# Patient Record
Sex: Female | Born: 1960
Health system: Southern US, Community
[De-identification: ages and names within clinical notes are randomized; demographics above are authoritative.]

## PROBLEM LIST (undated history)

## (undated) DIAGNOSIS — F329 Major depressive disorder, single episode, unspecified: Secondary | ICD-10-CM

## (undated) DIAGNOSIS — R011 Cardiac murmur, unspecified: Secondary | ICD-10-CM

## (undated) DIAGNOSIS — I10 Essential (primary) hypertension: Secondary | ICD-10-CM

## (undated) DIAGNOSIS — E119 Type 2 diabetes mellitus without complications: Secondary | ICD-10-CM

## (undated) DIAGNOSIS — F419 Anxiety disorder, unspecified: Secondary | ICD-10-CM

## (undated) DIAGNOSIS — T7840XA Allergy, unspecified, initial encounter: Secondary | ICD-10-CM

## (undated) DIAGNOSIS — M1990 Unspecified osteoarthritis, unspecified site: Secondary | ICD-10-CM

## (undated) DIAGNOSIS — F32A Depression, unspecified: Secondary | ICD-10-CM

## (undated) DIAGNOSIS — D649 Anemia, unspecified: Secondary | ICD-10-CM

## (undated) HISTORY — DX: Anemia, unspecified: D64.9

## (undated) HISTORY — PX: TUBAL LIGATION: SHX77

## (undated) HISTORY — DX: Allergy, unspecified, initial encounter: T78.40XA

## (undated) HISTORY — DX: Type 2 diabetes mellitus without complications: E11.9

## (undated) HISTORY — DX: Major depressive disorder, single episode, unspecified: F32.9

## (undated) HISTORY — DX: Depression, unspecified: F32.A

## (undated) HISTORY — PX: BLADDER SUSPENSION: SHX72

## (undated) HISTORY — DX: Essential (primary) hypertension: I10

## (undated) HISTORY — PX: ABDOMINAL HYSTERECTOMY: SHX81

## (undated) HISTORY — DX: Anxiety disorder, unspecified: F41.9

## (undated) HISTORY — PX: APPENDECTOMY: SHX54

---

## 1998-01-15 ENCOUNTER — Encounter: Admission: RE | Admit: 1998-01-15 | Discharge: 1998-04-15 | Payer: Self-pay | Admitting: Neurosurgery

## 1998-03-18 ENCOUNTER — Emergency Department (HOSPITAL_COMMUNITY): Admission: EM | Admit: 1998-03-18 | Discharge: 1998-03-18 | Payer: Self-pay

## 2001-03-01 ENCOUNTER — Encounter: Payer: Self-pay | Admitting: Emergency Medicine

## 2001-03-01 ENCOUNTER — Emergency Department (HOSPITAL_COMMUNITY): Admission: EM | Admit: 2001-03-01 | Discharge: 2001-03-01 | Payer: Self-pay | Admitting: Emergency Medicine

## 2001-07-22 ENCOUNTER — Emergency Department (HOSPITAL_COMMUNITY): Admission: EM | Admit: 2001-07-22 | Discharge: 2001-07-22 | Payer: Self-pay | Admitting: Emergency Medicine

## 2002-07-18 ENCOUNTER — Encounter: Payer: Self-pay | Admitting: Emergency Medicine

## 2002-07-18 ENCOUNTER — Emergency Department (HOSPITAL_COMMUNITY): Admission: EM | Admit: 2002-07-18 | Discharge: 2002-07-18 | Payer: Self-pay | Admitting: Emergency Medicine

## 2003-03-09 ENCOUNTER — Emergency Department (HOSPITAL_COMMUNITY): Admission: EM | Admit: 2003-03-09 | Discharge: 2003-03-09 | Payer: Self-pay | Admitting: Emergency Medicine

## 2003-03-09 ENCOUNTER — Encounter: Payer: Self-pay | Admitting: Emergency Medicine

## 2003-05-11 ENCOUNTER — Emergency Department (HOSPITAL_COMMUNITY): Admission: EM | Admit: 2003-05-11 | Discharge: 2003-05-11 | Payer: Self-pay | Admitting: Emergency Medicine

## 2003-10-24 ENCOUNTER — Emergency Department (HOSPITAL_COMMUNITY): Admission: EM | Admit: 2003-10-24 | Discharge: 2003-10-24 | Payer: Self-pay | Admitting: Emergency Medicine

## 2003-10-25 ENCOUNTER — Encounter (INDEPENDENT_AMBULATORY_CARE_PROVIDER_SITE_OTHER): Payer: Self-pay | Admitting: Specialist

## 2003-10-25 ENCOUNTER — Inpatient Hospital Stay (HOSPITAL_COMMUNITY): Admission: EM | Admit: 2003-10-25 | Discharge: 2003-10-26 | Payer: Self-pay | Admitting: Emergency Medicine

## 2003-11-13 ENCOUNTER — Emergency Department (HOSPITAL_COMMUNITY): Admission: EM | Admit: 2003-11-13 | Discharge: 2003-11-13 | Payer: Self-pay | Admitting: Emergency Medicine

## 2004-01-17 ENCOUNTER — Emergency Department (HOSPITAL_COMMUNITY): Admission: EM | Admit: 2004-01-17 | Discharge: 2004-01-17 | Payer: Self-pay | Admitting: Emergency Medicine

## 2004-02-05 ENCOUNTER — Emergency Department (HOSPITAL_COMMUNITY): Admission: EM | Admit: 2004-02-05 | Discharge: 2004-02-05 | Payer: Self-pay | Admitting: Family Medicine

## 2004-09-11 ENCOUNTER — Emergency Department (HOSPITAL_COMMUNITY): Admission: EM | Admit: 2004-09-11 | Discharge: 2004-09-11 | Payer: Self-pay | Admitting: Emergency Medicine

## 2004-11-15 ENCOUNTER — Ambulatory Visit: Payer: Self-pay | Admitting: Family Medicine

## 2004-12-06 ENCOUNTER — Ambulatory Visit: Payer: Self-pay | Admitting: Family Medicine

## 2004-12-20 ENCOUNTER — Ambulatory Visit: Payer: Self-pay | Admitting: Family Medicine

## 2004-12-20 ENCOUNTER — Other Ambulatory Visit: Admission: RE | Admit: 2004-12-20 | Discharge: 2004-12-20 | Payer: Self-pay | Admitting: Family Medicine

## 2005-01-04 ENCOUNTER — Ambulatory Visit: Payer: Self-pay

## 2005-01-12 ENCOUNTER — Ambulatory Visit: Payer: Self-pay | Admitting: Family Medicine

## 2005-01-19 ENCOUNTER — Emergency Department (HOSPITAL_COMMUNITY): Admission: EM | Admit: 2005-01-19 | Discharge: 2005-01-19 | Payer: Self-pay | Admitting: Emergency Medicine

## 2005-01-26 ENCOUNTER — Ambulatory Visit: Payer: Self-pay | Admitting: Family Medicine

## 2005-05-30 ENCOUNTER — Ambulatory Visit: Payer: Self-pay | Admitting: Family Medicine

## 2005-09-07 ENCOUNTER — Emergency Department (HOSPITAL_COMMUNITY): Admission: EM | Admit: 2005-09-07 | Discharge: 2005-09-08 | Payer: Self-pay | Admitting: Emergency Medicine

## 2005-09-08 ENCOUNTER — Emergency Department (HOSPITAL_COMMUNITY): Admission: EM | Admit: 2005-09-08 | Discharge: 2005-09-08 | Payer: Self-pay | Admitting: Emergency Medicine

## 2006-04-12 ENCOUNTER — Ambulatory Visit: Payer: Self-pay | Admitting: Family Medicine

## 2006-07-13 ENCOUNTER — Ambulatory Visit: Payer: Self-pay | Admitting: Family Medicine

## 2007-01-16 ENCOUNTER — Emergency Department (HOSPITAL_COMMUNITY): Admission: EM | Admit: 2007-01-16 | Discharge: 2007-01-16 | Payer: Self-pay | Admitting: Emergency Medicine

## 2007-03-08 ENCOUNTER — Ambulatory Visit: Payer: Self-pay | Admitting: Internal Medicine

## 2007-03-09 DIAGNOSIS — J45909 Unspecified asthma, uncomplicated: Secondary | ICD-10-CM | POA: Insufficient documentation

## 2007-03-09 DIAGNOSIS — J301 Allergic rhinitis due to pollen: Secondary | ICD-10-CM | POA: Insufficient documentation

## 2007-03-15 ENCOUNTER — Encounter: Admission: RE | Admit: 2007-03-15 | Discharge: 2007-03-15 | Payer: Self-pay | Admitting: Internal Medicine

## 2007-03-19 ENCOUNTER — Ambulatory Visit: Payer: Self-pay | Admitting: Family Medicine

## 2007-03-19 DIAGNOSIS — I10 Essential (primary) hypertension: Secondary | ICD-10-CM | POA: Insufficient documentation

## 2007-04-05 ENCOUNTER — Ambulatory Visit: Payer: Self-pay | Admitting: Family Medicine

## 2007-04-05 DIAGNOSIS — K219 Gastro-esophageal reflux disease without esophagitis: Secondary | ICD-10-CM | POA: Insufficient documentation

## 2007-04-05 DIAGNOSIS — R079 Chest pain, unspecified: Secondary | ICD-10-CM | POA: Insufficient documentation

## 2007-04-05 DIAGNOSIS — J019 Acute sinusitis, unspecified: Secondary | ICD-10-CM | POA: Insufficient documentation

## 2007-04-09 LAB — CONVERTED CEMR LAB
ALT: 13 units/L (ref 0–40)
AST: 16 units/L (ref 0–37)
Albumin: 3.2 g/dL — ABNORMAL LOW (ref 3.5–5.2)
Alkaline Phosphatase: 87 units/L (ref 39–117)
BUN: 3 mg/dL — ABNORMAL LOW (ref 6–23)
Basophils Absolute: 0 10*3/uL (ref 0.0–0.1)
Basophils Relative: 0.9 % (ref 0.0–1.0)
Bilirubin, Direct: 0.1 mg/dL (ref 0.0–0.3)
CO2: 30 meq/L (ref 19–32)
Calcium: 8.4 mg/dL (ref 8.4–10.5)
Chloride: 104 meq/L (ref 96–112)
Cholesterol: 143 mg/dL (ref 0–200)
Creatinine, Ser: 0.7 mg/dL (ref 0.4–1.2)
Eosinophils Absolute: 0.2 10*3/uL (ref 0.0–0.6)
Eosinophils Relative: 5.6 % — ABNORMAL HIGH (ref 0.0–5.0)
GFR calc Af Amer: 117 mL/min
GFR calc non Af Amer: 97 mL/min
Glucose, Bld: 106 mg/dL — ABNORMAL HIGH (ref 70–99)
HCT: 34.4 % — ABNORMAL LOW (ref 36.0–46.0)
HDL: 50.5 mg/dL (ref >39.0)
Hemoglobin: 12 g/dL (ref 12.0–15.0)
LDL Cholesterol: 72 mg/dL (ref 0–99)
Lymphocytes Relative: 37.2 % (ref 12.0–46.0)
MCHC: 34.8 g/dL (ref 30.0–36.0)
MCV: 83 fL (ref 78.0–100.0)
Monocytes Absolute: 0.5 10*3/uL (ref 0.2–0.7)
Monocytes Relative: 12.2 % — ABNORMAL HIGH (ref 3.0–11.0)
Neutro Abs: 1.9 10*3/uL (ref 1.4–7.7)
Neutrophils Relative %: 44.1 % (ref 43.0–77.0)
Platelets: 295 10*3/uL (ref 150–400)
Potassium: 3.3 meq/L — ABNORMAL LOW (ref 3.5–5.1)
RBC: 4.14 M/uL (ref 3.87–5.11)
RDW: 14.8 % — ABNORMAL HIGH (ref 11.5–14.6)
Sodium: 138 meq/L (ref 135–145)
TSH: 2.72 microintl units/mL (ref 0.35–5.50)
Total Bilirubin: 0.4 mg/dL (ref 0.3–1.2)
Total CHOL/HDL Ratio: 2.8
Total Protein: 7 g/dL (ref 6.0–8.3)
Triglycerides: 101 mg/dL (ref 0–149)
VLDL: 20 mg/dL (ref 0–40)
WBC: 4.1 10*3/uL — ABNORMAL LOW (ref 4.5–10.5)

## 2007-05-08 ENCOUNTER — Telehealth (INDEPENDENT_AMBULATORY_CARE_PROVIDER_SITE_OTHER): Payer: Self-pay | Admitting: *Deleted

## 2007-06-21 ENCOUNTER — Encounter: Payer: Self-pay | Admitting: Family Medicine

## 2007-06-21 ENCOUNTER — Other Ambulatory Visit: Admission: RE | Admit: 2007-06-21 | Discharge: 2007-06-21 | Payer: Self-pay | Admitting: Family Medicine

## 2007-06-21 ENCOUNTER — Ambulatory Visit: Payer: Self-pay | Admitting: Family Medicine

## 2007-06-21 DIAGNOSIS — D239 Other benign neoplasm of skin, unspecified: Secondary | ICD-10-CM | POA: Insufficient documentation

## 2007-06-21 DIAGNOSIS — K921 Melena: Secondary | ICD-10-CM | POA: Insufficient documentation

## 2007-06-21 DIAGNOSIS — J309 Allergic rhinitis, unspecified: Secondary | ICD-10-CM | POA: Insufficient documentation

## 2007-06-21 LAB — CONVERTED CEMR LAB: Pap Smear: NORMAL

## 2007-06-25 LAB — CONVERTED CEMR LAB
ALT: 15 units/L (ref 0–35)
AST: 16 units/L (ref 0–37)
Albumin: 3.4 g/dL — ABNORMAL LOW (ref 3.5–5.2)
Alkaline Phosphatase: 77 units/L (ref 39–117)
BUN: 3 mg/dL — ABNORMAL LOW (ref 6–23)
Basophils Absolute: 0 10*3/uL (ref 0.0–0.1)
Basophils Relative: 0 % (ref 0.0–1.0)
Bilirubin, Direct: 0.1 mg/dL (ref 0.0–0.3)
CO2: 32 meq/L (ref 19–32)
Calcium: 8.6 mg/dL (ref 8.4–10.5)
Chloride: 102 meq/L (ref 96–112)
Cholesterol: 163 mg/dL (ref 0–200)
Creatinine, Ser: 0.5 mg/dL (ref 0.4–1.2)
Eosinophils Absolute: 0.1 10*3/uL (ref 0.0–0.6)
Eosinophils Relative: 2.6 % (ref 0.0–5.0)
GFR calc Af Amer: 172 mL/min
GFR calc non Af Amer: 142 mL/min
Glucose, Bld: 105 mg/dL — ABNORMAL HIGH (ref 70–99)
HCT: 35.1 % — ABNORMAL LOW (ref 36.0–46.0)
HDL: 60.3 mg/dL (ref >39.0)
Hemoglobin: 11.9 g/dL — ABNORMAL LOW (ref 12.0–15.0)
LDL Cholesterol: 85 mg/dL (ref 0–99)
Lymphocytes Relative: 42.7 % (ref 12.0–46.0)
MCHC: 33.8 g/dL (ref 30.0–36.0)
MCV: 82.8 fL (ref 78.0–100.0)
Monocytes Absolute: 0.4 10*3/uL (ref 0.2–0.7)
Monocytes Relative: 11.4 % — ABNORMAL HIGH (ref 3.0–11.0)
Neutro Abs: 1.6 10*3/uL (ref 1.4–7.7)
Neutrophils Relative %: 43.3 % (ref 43.0–77.0)
Platelets: 346 10*3/uL (ref 150–400)
Potassium: 2.9 meq/L — ABNORMAL LOW (ref 3.5–5.1)
RBC: 4.25 M/uL (ref 3.87–5.11)
RDW: 14.9 % — ABNORMAL HIGH (ref 11.5–14.6)
Sodium: 140 meq/L (ref 135–145)
TSH: 1.76 microintl units/mL (ref 0.35–5.50)
Total Bilirubin: 0.6 mg/dL (ref 0.3–1.2)
Total CHOL/HDL Ratio: 2.7
Total Protein: 7.3 g/dL (ref 6.0–8.3)
Triglycerides: 87 mg/dL (ref 0–149)
VLDL: 17 mg/dL (ref 0–40)
WBC: 3.7 10*3/uL — ABNORMAL LOW (ref 4.5–10.5)

## 2007-06-28 ENCOUNTER — Encounter (INDEPENDENT_AMBULATORY_CARE_PROVIDER_SITE_OTHER): Payer: Self-pay | Admitting: *Deleted

## 2007-07-03 ENCOUNTER — Encounter: Admission: RE | Admit: 2007-07-03 | Discharge: 2007-07-03 | Payer: Self-pay | Admitting: Family Medicine

## 2007-07-05 ENCOUNTER — Ambulatory Visit: Payer: Self-pay | Admitting: Family Medicine

## 2007-07-30 ENCOUNTER — Observation Stay (HOSPITAL_COMMUNITY): Admission: EM | Admit: 2007-07-30 | Discharge: 2007-07-31 | Payer: Self-pay | Admitting: Emergency Medicine

## 2007-07-30 ENCOUNTER — Encounter (INDEPENDENT_AMBULATORY_CARE_PROVIDER_SITE_OTHER): Payer: Self-pay | Admitting: Surgery

## 2007-08-16 ENCOUNTER — Encounter: Payer: Self-pay | Admitting: Family Medicine

## 2007-08-31 ENCOUNTER — Emergency Department (HOSPITAL_COMMUNITY): Admission: EM | Admit: 2007-08-31 | Discharge: 2007-08-31 | Payer: Self-pay | Admitting: Emergency Medicine

## 2007-09-03 ENCOUNTER — Ambulatory Visit: Payer: Self-pay | Admitting: Family Medicine

## 2007-09-03 DIAGNOSIS — Z862 Personal history of diseases of the blood and blood-forming organs and certain disorders involving the immune mechanism: Secondary | ICD-10-CM

## 2007-09-03 DIAGNOSIS — Z8639 Personal history of other endocrine, nutritional and metabolic disease: Secondary | ICD-10-CM

## 2007-09-03 DIAGNOSIS — E876 Hypokalemia: Secondary | ICD-10-CM | POA: Insufficient documentation

## 2007-09-03 DIAGNOSIS — R109 Unspecified abdominal pain: Secondary | ICD-10-CM | POA: Insufficient documentation

## 2007-09-03 DIAGNOSIS — N390 Urinary tract infection, site not specified: Secondary | ICD-10-CM | POA: Insufficient documentation

## 2007-09-03 DIAGNOSIS — N760 Acute vaginitis: Secondary | ICD-10-CM | POA: Insufficient documentation

## 2007-09-03 LAB — CONVERTED CEMR LAB
Bilirubin Urine: NEGATIVE
Blood in Urine, dipstick: NEGATIVE
Glucose, Urine, Semiquant: NEGATIVE
Ketones, urine, test strip: NEGATIVE
Nitrite: NEGATIVE
Protein, U semiquant: NEGATIVE
Specific Gravity, Urine: <1.005
Urobilinogen, UA: NEGATIVE
pH: 6

## 2007-09-04 ENCOUNTER — Encounter: Payer: Self-pay | Admitting: Family Medicine

## 2007-09-05 ENCOUNTER — Encounter: Payer: Self-pay | Admitting: Family Medicine

## 2007-09-06 LAB — CONVERTED CEMR LAB
Chlamydia, DNA Probe: NEGATIVE
GC Probe Amp, Genital: NEGATIVE

## 2007-09-07 ENCOUNTER — Encounter (INDEPENDENT_AMBULATORY_CARE_PROVIDER_SITE_OTHER): Payer: Self-pay | Admitting: *Deleted

## 2007-09-10 ENCOUNTER — Telehealth (INDEPENDENT_AMBULATORY_CARE_PROVIDER_SITE_OTHER): Payer: Self-pay | Admitting: *Deleted

## 2007-09-10 DIAGNOSIS — D259 Leiomyoma of uterus, unspecified: Secondary | ICD-10-CM | POA: Insufficient documentation

## 2007-09-14 ENCOUNTER — Telehealth (INDEPENDENT_AMBULATORY_CARE_PROVIDER_SITE_OTHER): Payer: Self-pay | Admitting: *Deleted

## 2007-09-18 ENCOUNTER — Encounter (INDEPENDENT_AMBULATORY_CARE_PROVIDER_SITE_OTHER): Payer: Self-pay | Admitting: Diagnostic Radiology

## 2007-09-18 ENCOUNTER — Encounter: Admission: RE | Admit: 2007-09-18 | Discharge: 2007-09-18 | Payer: Self-pay | Admitting: Emergency Medicine

## 2007-10-24 ENCOUNTER — Encounter: Payer: Self-pay | Admitting: Family Medicine

## 2007-10-26 ENCOUNTER — Telehealth (INDEPENDENT_AMBULATORY_CARE_PROVIDER_SITE_OTHER): Payer: Self-pay | Admitting: *Deleted

## 2007-11-08 ENCOUNTER — Telehealth: Payer: Self-pay | Admitting: Family Medicine

## 2007-12-06 ENCOUNTER — Inpatient Hospital Stay (HOSPITAL_COMMUNITY): Admission: RE | Admit: 2007-12-06 | Discharge: 2007-12-09 | Payer: Self-pay | Admitting: Obstetrics and Gynecology

## 2007-12-06 ENCOUNTER — Encounter (INDEPENDENT_AMBULATORY_CARE_PROVIDER_SITE_OTHER): Payer: Self-pay | Admitting: Obstetrics and Gynecology

## 2007-12-16 ENCOUNTER — Inpatient Hospital Stay (HOSPITAL_COMMUNITY): Admission: AD | Admit: 2007-12-16 | Discharge: 2007-12-16 | Payer: Self-pay | Admitting: Obstetrics and Gynecology

## 2007-12-27 ENCOUNTER — Telehealth (INDEPENDENT_AMBULATORY_CARE_PROVIDER_SITE_OTHER): Payer: Self-pay | Admitting: *Deleted

## 2007-12-31 ENCOUNTER — Telehealth (INDEPENDENT_AMBULATORY_CARE_PROVIDER_SITE_OTHER): Payer: Self-pay | Admitting: *Deleted

## 2008-01-16 ENCOUNTER — Encounter: Payer: Self-pay | Admitting: Family Medicine

## 2008-04-25 ENCOUNTER — Telehealth (INDEPENDENT_AMBULATORY_CARE_PROVIDER_SITE_OTHER): Payer: Self-pay | Admitting: *Deleted

## 2008-09-17 ENCOUNTER — Telehealth (INDEPENDENT_AMBULATORY_CARE_PROVIDER_SITE_OTHER): Payer: Self-pay | Admitting: *Deleted

## 2008-11-05 ENCOUNTER — Emergency Department (HOSPITAL_COMMUNITY): Admission: EM | Admit: 2008-11-05 | Discharge: 2008-11-05 | Payer: Self-pay | Admitting: Emergency Medicine

## 2008-11-13 ENCOUNTER — Encounter: Admission: RE | Admit: 2008-11-13 | Discharge: 2008-11-13 | Payer: Self-pay | Admitting: Family Medicine

## 2008-11-13 ENCOUNTER — Ambulatory Visit: Payer: Self-pay | Admitting: Family Medicine

## 2008-11-13 DIAGNOSIS — M545 Low back pain, unspecified: Secondary | ICD-10-CM | POA: Insufficient documentation

## 2008-11-14 ENCOUNTER — Telehealth: Payer: Self-pay | Admitting: Family Medicine

## 2008-12-09 ENCOUNTER — Ambulatory Visit: Payer: Self-pay | Admitting: Family Medicine

## 2009-02-18 ENCOUNTER — Ambulatory Visit: Payer: Self-pay | Admitting: Family Medicine

## 2009-02-18 ENCOUNTER — Telehealth: Payer: Self-pay | Admitting: Family Medicine

## 2009-03-23 ENCOUNTER — Telehealth: Payer: Self-pay | Admitting: Family Medicine

## 2009-03-23 ENCOUNTER — Encounter: Payer: Self-pay | Admitting: Family Medicine

## 2009-03-25 ENCOUNTER — Encounter (INDEPENDENT_AMBULATORY_CARE_PROVIDER_SITE_OTHER): Payer: Self-pay | Admitting: *Deleted

## 2009-04-17 ENCOUNTER — Encounter: Payer: Self-pay | Admitting: Family Medicine

## 2009-08-02 ENCOUNTER — Emergency Department (HOSPITAL_COMMUNITY): Admission: EM | Admit: 2009-08-02 | Discharge: 2009-08-02 | Payer: Self-pay | Admitting: Emergency Medicine

## 2009-08-14 ENCOUNTER — Ambulatory Visit: Payer: Self-pay | Admitting: Internal Medicine

## 2009-08-17 ENCOUNTER — Telehealth: Payer: Self-pay | Admitting: Family Medicine

## 2009-08-19 ENCOUNTER — Ambulatory Visit (HOSPITAL_COMMUNITY): Admission: RE | Admit: 2009-08-19 | Discharge: 2009-08-19 | Payer: Self-pay | Admitting: Internal Medicine

## 2009-08-28 ENCOUNTER — Telehealth (INDEPENDENT_AMBULATORY_CARE_PROVIDER_SITE_OTHER): Payer: Self-pay | Admitting: *Deleted

## 2009-08-28 DIAGNOSIS — N281 Cyst of kidney, acquired: Secondary | ICD-10-CM | POA: Insufficient documentation

## 2009-09-14 ENCOUNTER — Telehealth (INDEPENDENT_AMBULATORY_CARE_PROVIDER_SITE_OTHER): Payer: Self-pay | Admitting: *Deleted

## 2009-09-17 ENCOUNTER — Ambulatory Visit: Payer: Self-pay | Admitting: Family Medicine

## 2009-09-17 ENCOUNTER — Ambulatory Visit: Payer: Self-pay

## 2009-09-17 DIAGNOSIS — M79609 Pain in unspecified limb: Secondary | ICD-10-CM | POA: Insufficient documentation

## 2009-09-17 DIAGNOSIS — IMO0002 Reserved for concepts with insufficient information to code with codable children: Secondary | ICD-10-CM | POA: Insufficient documentation

## 2009-09-18 ENCOUNTER — Telehealth: Payer: Self-pay | Admitting: Family Medicine

## 2009-10-07 ENCOUNTER — Observation Stay (HOSPITAL_COMMUNITY): Admission: EM | Admit: 2009-10-07 | Discharge: 2009-10-08 | Payer: Self-pay | Admitting: Emergency Medicine

## 2009-10-09 ENCOUNTER — Encounter: Payer: Self-pay | Admitting: Family Medicine

## 2009-12-10 ENCOUNTER — Telehealth: Payer: Self-pay | Admitting: Family Medicine

## 2009-12-22 ENCOUNTER — Ambulatory Visit: Payer: Self-pay | Admitting: Family Medicine

## 2009-12-22 DIAGNOSIS — F332 Major depressive disorder, recurrent severe without psychotic features: Secondary | ICD-10-CM | POA: Insufficient documentation

## 2010-01-21 ENCOUNTER — Telehealth (INDEPENDENT_AMBULATORY_CARE_PROVIDER_SITE_OTHER): Payer: Self-pay | Admitting: *Deleted

## 2010-01-21 ENCOUNTER — Encounter (INDEPENDENT_AMBULATORY_CARE_PROVIDER_SITE_OTHER): Payer: Self-pay | Admitting: *Deleted

## 2010-01-25 ENCOUNTER — Telehealth (INDEPENDENT_AMBULATORY_CARE_PROVIDER_SITE_OTHER): Payer: Self-pay | Admitting: *Deleted

## 2010-01-29 ENCOUNTER — Ambulatory Visit: Payer: Self-pay | Admitting: Family Medicine

## 2010-03-28 ENCOUNTER — Emergency Department (HOSPITAL_COMMUNITY): Admission: EM | Admit: 2010-03-28 | Discharge: 2010-03-28 | Payer: Self-pay | Admitting: Emergency Medicine

## 2010-03-30 ENCOUNTER — Ambulatory Visit: Payer: Self-pay | Admitting: Family Medicine

## 2010-04-06 ENCOUNTER — Emergency Department (HOSPITAL_COMMUNITY): Admission: EM | Admit: 2010-04-06 | Discharge: 2010-04-06 | Payer: Self-pay | Admitting: Emergency Medicine

## 2010-05-28 ENCOUNTER — Telehealth: Payer: Self-pay | Admitting: Family Medicine

## 2010-06-08 ENCOUNTER — Telehealth: Payer: Self-pay | Admitting: Family Medicine

## 2010-11-07 ENCOUNTER — Encounter: Payer: Self-pay | Admitting: Family Medicine

## 2010-11-08 ENCOUNTER — Encounter: Payer: Self-pay | Admitting: Family Medicine

## 2010-11-16 NOTE — Letter (Signed)
[facility]   Imported By: Freddy Jaksch 09/11/2007 16:43:08  _____________________________________________________________________  External Attachment:    Type:   Image     Comment:   External Document

## 2010-11-16 NOTE — Assessment & Plan Note (Signed)
Summary: allergic reaction   Vital Signs:  Patient profile:   50 year old female Height:      64.25 inches Weight:      238 pounds BMI:     40.68 Temp:     98.3 degrees F oral Pulse rate:   74 / minute Resp:     18 per minute BP sitting:   130 / 98  (left arm)  Vitals Entered By: Ardyth Man (Feb 18, 2009 11:48 AM) CC: severe pain in her spine from arthritis Is Patient Diabetic? No  Have you ever been in a relationship where you felt threatened, hurt or afraid?No   Does patient need assistance? Functional Status Self care Ambulation Normal   History of Present Illness: Pt here secondary to back pain.  Disability has not come through yet but Insurance come through in June.  Pain is getting worse per pt.  Pt states Toradol helped last visit but she had reaction to flexeril and ultram.    Current Medications (verified): 1)  Maxzide-25 37.5-25 Mg Tabs (Triamterene-Hctz) .Marland Kitchen.. 1 By Mouth Once Daily 2)  Advair Diskus 250-50 Mcg/dose  Misc (Fluticasone-Salmeterol) .Marland Kitchen.. 1 Inh Two Times A Day 3)  Proair Hfa 108 (90 Base) Mcg/act  Aers (Albuterol Sulfate) .... 2  Puffs Qid As Needed 4)  Toprol Xl 50 Mg  Tb24 (Metoprolol Succinate) .Marland Kitchen.. 1 By Mouth Once Daily 5)  Orphenadrine Citrate Cr 100 Mg Xr12h-Tab (Orphenadrine Citrate) .... Take 1 Tablet By Mouth Two Times A Day 6)  Dilaudid 8 Mg Tabs (Hydromorphone Hcl) .Marland Kitchen.. 1 By Mouth Every 4 Hours As Needed  Allergies (verified): 1)  ! Codeine  Past History:  Past medical, surgical, family and social histories (including risk factors) reviewed, and no changes noted (except as noted below).  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 Lungs are clear to auscultation, no crackles or wheezes. Heart:  Normal rate and regular rhythm. S1 and S2 normal without gallop, murmur, click, rub or other extra sounds. Msk:  no joint swelling, no joint warmth, no redness over joints, no joint deformities, no joint instability, and no crepitation.   Extremities:  No clubbing, cyanosis, edema, or deformity noted with normal full range of motion of all joints.   Neurologic:  Pt c/o pain in R leg to knee but weakness is in L leg with ext  otherwise good strength Pt walking with crutches Skin:  Intact without suspicious lesions or rashes Psych:  Oriented X3 and normally interactive.     Impression & Recommendations:  Problem # 1:  LOW BACK PAIN, ACUTE (ICD-724.2)  The following medications were removed from the medication list:    Ultram 50 Mg Tabs (Tramadol hcl) .Marland Kitchen... 1 by mouth every 6 hours as needed    Flexeril 10 Mg Tabs (Cyclobenzaprine hcl) .Marland Kitchen... 1 by mouth three times a day as needed Her updated medication list for this problem includes:    Orphenadrine Citrate Cr 100 Mg Xr12h-tab (Orphenadrine citrate) .Marland Kitchen... Take 1 tablet by mouth two times a day    Dilaudid 8 Mg Tabs (Hydromorphone hcl) .Marland Kitchen... 1 by mouth every 4 hours as needed  Discussed use of moist heat or ice, modified activities, medications, and  stretching/strengthening exercises. Back care instructions given. To be seen in 2 weeks if no improvement; sooner if worsening of symptoms.   Orders: Ketorolac-Toradol 15mg  (A2130) Admin of Therapeutic Inj  intramuscular or subcutaneous (86578) Neurosurgeon Referral (Neurosurgeon)  Complete Medication List: 1)  Maxzide-25 37.5-25 Mg Tabs (Triamterene-hctz) .Marland Kitchen.. 1 by mouth once daily 2)  Advair Diskus 250-50 Mcg/dose Misc (Fluticasone-salmeterol) .Marland Kitchen.. 1 inh two times a day 3)  Proair Hfa 108 (90 Base) Mcg/act Aers (Albuterol sulfate) .... 2  puffs qid as needed 4)  Toprol Xl 50 Mg Tb24 (Metoprolol succinate) .Marland Kitchen.. 1 by mouth once daily 5)  Orphenadrine Citrate Cr 100 Mg Xr12h-tab (Orphenadrine citrate) .... Take 1 tablet by mouth two times a day 6)  Dilaudid 8 Mg Tabs (Hydromorphone hcl) .Marland Kitchen.. 1 by mouth every 4 hours as needed Prescriptions: DILAUDID 8 MG TABS (HYDROMORPHONE HCL) 1 by mouth every 4 hours as needed  #60 x 0   Entered and Authorized by:   Loreen Freud DO   Signed by:   Loreen Freud DO on 02/18/2009   Method used:   Print then Give to Patient   RxID:   [id number]    Medication Administration  Injection # 1:    Medication: Ketorolac-Toradol 15mg     Diagnosis: LOW BACK PAIN, ACUTE (ICD-724.2)    Route: IM    Site: RUOQ gluteus    Exp Date: 05/16/2010    Lot #: [id number]    Mfr: novaplus    Given by: Ardyth Man (Feb 18, 2009 12:09 PM)  Orders Added: 1)  Est. Patient Level III [66440] 2)  Ketorolac-Toradol 15mg  [J1885] 3)  Admin of Therapeutic Inj  intramuscular or subcutaneous [96372] 4)  Neurosurgeon Referral [Neurosurgeon]

## 2010-11-16 NOTE — Progress Notes (Signed)
Summary: Refill Request  Phone Note Refill Request Call back at [phone] Message from:  Pharmacy on June 08, 2010 12:37 PM  Refills Requested: Medication #1:  FLEXERIL 10 MG TABS 1 by mouth three times a day as needed.   Dosage confirmed as above?Dosage Confirmed   Supply Requested: 1 month   Last Refilled: 05/28/2010 [facility]  Next Appointment Scheduled: none Initial call taken by: Lavell Islam,  June 08, 2010 12:37 PM  Follow-up for Phone Call        last ov- 03/30/10. Army Fossa CMA  June 08, 2010 4:43 PM   Additional Follow-up for Phone Call Additional follow up Details #1::        refill x1 Additional Follow-up by: Loreen Freud DO,  June 08, 2010 5:04 PM    Additional Follow-up for Phone Call Additional follow up Details #2::    Phoned in. Lucious Groves CMA  June 09, 2010 11:35 AM   Prescriptions: FLEXERIL 10 MG TABS (CYCLOBENZAPRINE HCL) 1 by mouth three times a day as needed  #30 x 0   Entered by:   Lucious Groves CMA   Authorized by:   Loreen Freud DO   Signed by:   Lucious Groves CMA on 06/09/2010   Method used:   Telephoned to ...       [facility] ([facility]ail)       [address]       [place], [place]  [address]       Ph: [phone]       Fax: [phone]   RxID:   [id number]

## 2010-11-16 NOTE — Consult Note (Signed)
Summary: [facility]  [facility]   Imported By: Lanelle Bal 04/28/2009 11:46:33  _____________________________________________________________________  External Attachment:    Type:   Image     Comment:   External Document

## 2010-11-16 NOTE — Progress Notes (Signed)
Summary: pain meds  Phone Note Call from Patient Call back at Home Phone [phone]   Caller: Patient Summary of Call: Pt called and stated she was in a lot of pain and would like some type of pain pills is this possible?  Initial call taken by: Army Fossa CMA,  September 18, 2009 3:53 PM  Follow-up for Phone Call        ultram 50 mg  #60  1-2 by mouth every 6 hours as needed -- call monday if no better Follow-up by: Loreen Freud DO,  September 18, 2009 4:54 PM  Additional Follow-up for Phone Call Additional follow up Details #1::        pt aware.  Additional Follow-up by: Army Fossa CMA,  September 18, 2009 5:02 PM    New/Updated Medications: ULTRAM 50 MG TABS (TRAMADOL HCL) 1-2 every 6 hrs as needed. Prescriptions: ULTRAM 50 MG TABS (TRAMADOL HCL) 1-2 every 6 hrs as needed.  #60 x 0   Entered by:   Army Fossa CMA   Authorized by:   Loreen Freud DO   Signed by:   Army Fossa CMA on 09/18/2009   Method used:   Electronically to        [facility] [phone]* (retail)       [address]       [place], [place]  [address]       Ph: [phone]       Fax: [phone]   RxID:   [id number]

## 2010-11-16 NOTE — Assessment & Plan Note (Signed)
Summary: DISABILITY CON PER DR.LOWNE   Vital Signs:  Patient Profile:   50 Years Old Female Height:     64.25 inches Weight:      238 pounds Temp:     97.9 degrees F oral Pulse rate:   78 / minute Resp:     18 per minute BP sitting:   130 / 84  (right arm) Cuff size:   large  Pt. in pain?   no  Vitals Entered By: ENR                  PCP:  Laury Axon  Chief Complaint:  Disability Consult.  History of Present Illness: Pt here to discuss disability.  Pt in alot of pain still but can not afford to go to specialist because she has no insurance.   Pt has not been to social services yet.  Pt has not picked up pain meds yet.    Pt not able to walk without crutch.   Pt noticeably in pain and  uncomfortable.  Pt is not able to dress self.  Her daughter and husband help.      Current Allergies: ! 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       Never Smoked       Alcohol use-no       Drug use-no       Married       Regular exercise-no   Risk Factors: Tobacco use:  never Passive smoke exposure:  no Drug use:  no HIV high-risk behavior:  no Caffeine use:  1 drinks per day Alcohol use:  no Exercise:  no Seatbelt use:  100 %  Family History Risk Factors:    Family History of MI in females < 70 years old:  no    Family History of MI in males < 61 years old:   no  Mammogram History:    Date of Last Mammogram:  07/06/2007  PAP Smear History:    Date of Last PAP Smear:  06/21/2007   Review of Systems      See HPI   Physical Exam  General:     alert and uncomfortable-appearing.   Msk:     Pt unable to bend forward without pain. + pain with palpation low back.  Extremities:     No clubbing, cyanosis, edema, or deformity noted with normal full range of motion of all joints.   Neurologic:     alert & oriented X3 and cranial nerves II-XII intact.   Pt walks with crutch.   Psych:     Oriented X3, good eye contact, and subdued.  Impression & Recommendations:  Problem # 1:  LOW BACK PAIN, ACUTE (ICD-724.2)  The following medications were removed from the medication list:    Hydromorphone Hcl 4 Mg Tabs (Hydromorphone hcl) .Marland Kitchen... Take 1 tab by mouth every 4 hours as needed    Naprosyn 500 Mg Tabs (Naproxen) .Marland Kitchen... Take 1 by mouth two times a day  Her updated medication list for this problem includes:    Ultram 50 Mg Tabs (Tramadol hcl) .Marland Kitchen... 1 by mouth every 6 hours as needed    Orphenadrine Citrate Cr 100 Mg Xr12h-tab (Orphenadrine citrate) .Marland Kitchen... Take 1 tablet by mouth two times a day    Dilaudid 8 Mg Tabs (Hydromorphone hcl) .Marland Kitchen... 1 by mouth every 4 hours as needed    Flexeril 10 Mg Tabs (Cyclobenzaprine hcl) .Marland Kitchen... 1 by mouth three times a day as needed Discussed use of moist heat or ice, modified activities, medications, and stretching/strengthening exercises. Back care instructions given. To be seen in 2 weeks if no improvement; sooner if worsening of symptoms.  Orders: Ketorolac-Toradol 15mg  (Z6109) Admin of Therapeutic Inj  intramuscular or subcutaneous (60454)   Complete Medication List: 1)  Maxzide-25 37.5-25 Mg Tabs (Triamterene-hctz) .Marland Kitchen.. 1 by mouth once daily 2)  Advair Diskus 250-50 Mcg/dose Misc (Fluticasone-salmeterol) .Marland Kitchen.. 1 inh two times a day 3)  Proair Hfa 108 (90 Base) Mcg/act Aers (Albuterol sulfate) .... 2   puffs qid as needed 4)  Toprol Xl 50 Mg Tb24 (Metoprolol succinate) .Marland Kitchen.. 1 by mouth once daily 5)  Ultram 50 Mg Tabs (Tramadol hcl) .Marland Kitchen.. 1 by mouth every 6 hours as needed 6)  Orphenadrine Citrate Cr 100 Mg Xr12h-tab (Orphenadrine citrate) .... Take 1 tablet by mouth two times a day 7)  Dilaudid 8 Mg Tabs (Hydromorphone hcl) .Marland Kitchen.. 1 by mouth every 4 hours as needed 8)  Flexeril 10 Mg Tabs (Cyclobenzaprine hcl) .Marland Kitchen.. 1 by mouth three times a day as needed      Medication Administration  Injection # 1:    Medication: Ketorolac-Toradol 15mg     Diagnosis: LOW BACK PAIN, ACUTE (ICD-724.2)    Route: IM    Site: RUOQ gluteus    Exp Date: 08/15/2010    Lot #: [id number]    Mfr: app    Patient tolerated injection without complications    Given by: Ardyth Man (December 09, 2008 11:28 AM)  Orders Added: 1)  Ketorolac-Toradol 15mg  [J1885] 2)  Admin of Therapeutic Inj  intramuscular or subcutaneous [14782]

## 2010-11-16 NOTE — Assessment & Plan Note (Signed)
Summary: hospital follow up/drb   Vital Signs:  Patient profile:   50 year old female Height:      64 inches Weight:      228 pounds BMI:     39.28 Temp:     98.4 degrees F oral Pulse rate:   90 / minute Pulse rhythm:   regular BP sitting:   136 / 80  (left arm) Cuff size:   regular  Vitals Entered By: Army Fossa CMA (March 30, 2010 2:04 PM) CC: Pt here for hospital follow up, still have extreme back pain, Back Pain   History of Present Illness:       This is a 50 year old woman who presents with Back Pain.  The symptoms began 4 days ago.  No known injury.   exacerbation of previous arthritis pain.  Pt is working on getting medicaid so she should be able to see specialist soon.  The patient denies fever, chills, weakness, loss of sensation, fecal incontinence, urinary incontinence, urinary retention, dysuria, rest pain, inability to work, and inability to care for self.  The pain is located in the right low back.  The pain began at home and suddenly.  The pain radiates to the right leg below the knee.  The pain is made worse by standing or walking, flexion, extension, and activity.  The pain is made better by inactivity.  Pt has been referred to specialist but has been unable to go secondary to no insurance.  Her medicaid should come through soon.  Allergies: 1)  ! Codeine  Past History:  Past medical, surgical, family and social histories (including risk factors) reviewed for relevance to current acute and chronic problems.  Past Medical History: Reviewed history from 06/21/2007 and no changes required. Hypertension Asthma Allergic rhinitis  Past Surgical History: Reviewed history from 06/21/2007 and no changes required. Appendectomy Caesarean section X3  Family History: Reviewed history from 06/21/2007 and no changes required. Family History of Cervical cancer Family History Depression Family History Hypertension Sister--- hole in heart  Social History: Reviewed  history from 08/14/2009 and no changes required. 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   see above Psych:  Oriented X3 and normally interactive.     Impression & Recommendations:  Problem # 1:  BACK PAIN, LUMBAR, WITH RADICULOPATHY (ICD-724.4) Pt needs referral but we need to wait until ins comes through. The following medications were removed from the medication list:    Ultram 50 Mg Tabs (Tramadol hcl) Her updated medication list for this problem includes:    Norco 5-325 Mg Tabs (Hydrocodone-acetaminophen) .Marland Kitchen... Take 1 by mouth every 4-6 hrs as needed.    Ibuprofen 600 Mg Tabs (Ibuprofen) .Marland Kitchen... Take 1 three times a day with meals    Flexeril 10 Mg Tabs (Cyclobenzaprine hcl) .Marland Kitchen... 1 by mouth three times a day as needed  Discussed use of moist heat or ice, modified activities, medications, and stretching/strengthening exercises. Back care instructions given. To be seen in 2 weeks if no improvement; sooner if worsening of symptoms.   Complete Medication List: 1)  Maxzide-25 37.5-25 Mg Tabs (Triamterene-hctz) .Marland Kitchen.. 1 by mouth once daily 2)  Advair Diskus 250-50 Mcg/dose Misc (Fluticasone-salmeterol) .Marland Kitchen.. 1 inh two times  a day 3)  Lexapro 10 Mg Tabs (Escitalopram oxalate) .Marland Kitchen.. 1 by mouth once daily 4)  Norco 5-325 Mg Tabs (Hydrocodone-acetaminophen) .... Take 1 by mouth every 4-6 hrs as needed. 5)  Ibuprofen 600 Mg Tabs (Ibuprofen) .... Take 1 three times a day with meals 6)  Flexeril 10 Mg Tabs (Cyclobenzaprine hcl) .Marland Kitchen.. 1 by mouth three times a day as needed Prescriptions: NORCO 5-325 MG TABS (HYDROCODONE-ACETAMINOPHEN) take 1 by mouth every 4-6 hrs as  needed.  #30 x 0   Entered and Authorized by:   Loreen Freud DO   Signed by:   Loreen Freud DO on 03/30/2010   Method used:   Print then Give to Patient   RxID:   [id number] FLEXERIL 10 MG TABS (CYCLOBENZAPRINE HCL) 1 by mouth three times a day as needed  #30 x 0   Entered and Authorized by:   Loreen Freud DO   Signed by:   Loreen Freud DO on 03/30/2010   Method used:   Electronically to        [facility] (retail)       [address]       [place], [place]  [address]       Ph: [phone]       Fax: [phone]   RxID:   [id number]

## 2010-11-16 NOTE — Progress Notes (Signed)
Summary: back pain/Lowne  Phone Note Call from Patient Call back at Work Phone [phone]   Caller: Patient Summary of Call: pt called was seen 12/09/08 c/o back pain pt says have no insurance, was given flexeril dilaudid, ultram. pt says she got allergic reaction and just scratched (pt did not call) I asked what did she do, she said I just dealt with the pain, but last few days pain worse, pt asking for a different med if possible. Dr Laury Axon I offered pt appt she says she has no insurance and no money Initial call taken by: Kandice Hams,  Feb 18, 2009 9:33 AM  Follow-up for Phone Call        That was 3 months ago --- pt needs to be reevaluated--- or see specialist Follow-up by: Loreen Freud DO,  Feb 18, 2009 10:02 AM  Additional Follow-up for Phone Call Additional follow up Details #1::        Patient aware Ardyth Man  Feb 18, 2009 10:38 AM Patient coming in at 11:30 today. Ardyth Man  Feb 18, 2009 10:41 AM  Additional Follow-up by: Ardyth Man,  Feb 18, 2009 10:38 AM    o

## 2010-11-16 NOTE — Progress Notes (Signed)
Summary: DISCUSS X-RAY  Phone Note Outgoing Call   Call placed by: Doristine Devoid,  September 10, 2007 2:54 PM Call placed to: Patient Details for Reason: X-RAY Summary of Call: PT AWARE REPORT SHOWED FIBROIDS SO PT IS GOING TO BE REFERRED TO GYN FOR FOLLOW-UP.

## 2010-11-16 NOTE — Progress Notes (Signed)
[facility]: [facility]  ---- Converted from flag ---- ---- 09/14/2007 8:33 AM, Loreen Freud DO wrote: [facility] has been tryingto reach pt get more views done.  Please try to reach pt and find out how they can reach her.  also---  pt has appt with gyn being set up---  copy of mammo and letter can be sent to gyn ------------------------------  CALLED PT TO INFORMED THAT [facility] HAD BEEN TRYING TO REACH HER TO SCHEDULE FOLLOW UP [facility]ED [facility] PT APPT SCHEDULE FOR TUES. DEC. 2 @ 2:40PM AND PT IS AWARE

## 2010-11-16 NOTE — Progress Notes (Signed)
Summary: lowne--question  Phone Note Call from Patient Call back at [facility] Phone [phone]   Summary of Call: pt is calling with question from another dr. office that dr. Laury Axon sent her too and only want to speak to her nurse. Initial call taken by: Freddy Jaksch,  October 26, 2007 3:21 PM  Follow-up for Phone Call        pt called she seen gyn this week in regards to fibroids was giving option to have surgery or take bcp and wanted to know what would be the be the best option so I advised pt schedule office visit with dr Laury Axon she agreed so appt scheduled for 2.2.09 Follow-up by: Doristine Devoid,  October 26, 2007 4:54 PM

## 2010-11-16 NOTE — Progress Notes (Signed)
Summary: [facility]   Imported By: Charolette Child 11/14/2007 15:50:29  _____________________________________________________________________  External Attachment:    Type:   Image     Comment:   External Document

## 2010-11-16 NOTE — Letter (Signed)
Summary: Fax Concerning Referral/[facility]  Fax Concerning Referral/[facility]   Imported By: Lanelle Bal 03/25/2009 10:14:52  _____________________________________________________________________  External Attachment:    Type:   Image     Comment:   External Document

## 2010-11-16 NOTE — Progress Notes (Signed)
Summary: REFILL REQUEST(MAXIDE)  Phone Note Refill Request   Refills Requested: Medication #1:  MAXZIDE-25 37.5-25 MG TABS   Last Refilled: 09/20/2007   Notes: 1 PO QD QTY-30, [facility] PHONE NUMBER-[phone], FAX NUMBER-[phone]     New/Updated Medications: MAXZIDE-25 37.5-25 MG TABS (TRIAMTERENE-HCTZ) 1 by mouth once daily   Prescriptions: MAXZIDE-25 37.5-25 MG TABS (TRIAMTERENE-HCTZ) 1 by mouth once daily  #30 x 3   Entered by:   Shonna Chock   Authorized by:   Loreen Freud DO   Signed by:   Shonna Chock on 12/27/2007   Method used:   Electronically sent to ...       [facility] [address]       [address]       [address]       [place], [place]  [address]       Ph: [phone] or [phone]       Fax: [phone]   RxID:   [id number]

## 2010-11-16 NOTE — Assessment & Plan Note (Signed)
Summary: 10 DAY FOLLOWUP PER DR PAZ///SPH  Medications Added MAXZIDE-25 37.5-25 MG TABS (TRIAMTERENE-HCTZ)         Vital Signs:  Patient Profile:   50 Years Old Female Weight:      228.6 pounds Pulse rate:   84 / minute BP sitting:   148 / 92  (left arm)  Vitals Entered By: Stephan Minister (March 19, 2007 4:16 PM)               PCP:  Laury Axon  Chief Complaint:  10 DAY FOLLOW-UP PER DR.PAZ.  History of Present Illness: Pt is here to f/u with Dr. Drue Novel.  She had 12 days of bleeding.   Pt better after Motrin.      Past Medical History:    Hypertension     Review of Systems  General      Denies chills, fatigue, fever, loss of appetite, malaise, sleep disorder, sweats, weakness, and weight loss.  CV      Denies chest pain or discomfort, difficulty breathing at night, difficulty breathing while lying down, palpitations, and shortness of breath with exertion.  Resp      Denies chest discomfort, chest pain with inspiration, cough, and shortness of breath.  GU      Complains of abnormal vaginal bleeding.      Had abnormal bleeding last visit.  now resolved.     Physical Exam  General:     Well-developed,well-nourished,in no acute distress; alert,appropriate and cooperative throughout examination Neck:     No deformities, masses, or tenderness noted. Lungs:     Normal respiratory effort, chest expands symmetrically. Lungs are clear to auscultation, no crackles or wheezes. Heart:     normal rate, regular rhythm, and Grade 1-2   /6 systolic ejection murmur.   Extremities:     1+ left pedal edema and 1+ right pedal edema.      Impression & Recommendations:  Problem # 1:  HYPERTENSION (ICD-401.9) Assessment: Deteriorated Pt been out of medication over weekend. D/W pt importance of not running out of meds.   Her updated medication list for this problem includes:    Maxzide-25 37.5-25 Mg Tabs (Triamterene-hctz) we will refill meds.  Pt will schedule labs and  cpe re check bp 2-3 weeks Orders: Cardiology Referral (Cardiology)   Medications Added to Medication List This Visit: 1)  Maxzide-25 37.5-25 Mg Tabs (Triamterene-hctz)   Patient Instructions: 1)  Please schedule a follow-up appointment in 2 weeks. 2)  It is important that you exercise regularly at least 20 minutes 5 times a week. If you develop chest pain, have severe difficulty breathing, or feel very tired , stop exercising immediately and seek medical attention. 3)  Check your Blood Pressure regularly. If it is above 130/89 : you should make an appointment. Prescriptions: MAXZIDE-25 37.5-25 MG TABS (TRIAMTERENE-HCTZ)   #30 x 3   Entered and Authorized by:   Loreen Freud DO   Signed by:   Loreen Freud DO on 03/19/2007   Method used:   Print then Give to Patient   RxID:   [id number]   Appended Document: 10 DAY FOLLOWUP PER DR PAZ///SPH Pt was here  to  follow up from Vaginal bleeding as well.  She is doing well using motrin for 3-4 days only.  US pelvis was reviewed with pt.  +fibroids.  We will refer to Gyn if symptoms persists.

## 2010-11-16 NOTE — Progress Notes (Signed)
Summary: letter for jury duty  Phone Note Call from Patient   Caller: Patient Summary of Call: patient left msg on voicemail would like to have letter written to be excused from jury duty.Marland KitchenMarland KitchenMarland KitchenDoristine Devoid  January 21, 2010 8:11 AM   Follow-up for Phone Call        ok to write note excusing her from jury duty secondary to undergoing treatment for severe depression Follow-up by: Loreen Freud DO,  January 21, 2010 12:49 PM  Additional Follow-up for Phone Call Additional follow up Details #1::        Letter written and mailed to court systems. Additional Follow-up by: Harold Barban,  January 21, 2010 1:22 PM

## 2010-11-16 NOTE — Letter (Signed)
Summary: Primary Care Consult Scheduled Letter  [facility] [place]  [address] [place], [place] [address]   Phone: [phone]  Fax: [phone]      03/25/2009 MRN: [id number]  Heather Walker [address] [place], [place]  [address]    Dear [name],      We have scheduled an appointment for you.  At the recommendation of Dr. Loreen Freud, we have scheduled you a consult with [facility] (Neurology) on 04-17-09 arrive by 11:00am.  Their address is [address], [address], [place] [address]. The office phone number is [phone].  If this appointment day and time is not convenient for you, please feel free to call the office of the doctor you are being referred to at the number listed above and reschedule the appointment.     It is important for you to keep your scheduled appointments. We are here to make sure you are given good patient care. If you have questions or you have made changes to your appointment, please notify us at  [phone], ask for Renee.    Thank you,  Patient Care Coordinator [facility]

## 2010-11-16 NOTE — Assessment & Plan Note (Signed)
Summary: CPX & LAB/CBS   Vital Signs:  Patient Profile:   50 Years Old Female LMP:     06/04/2007 Height:     64.25 inches Weight:      224 pounds Temp:     99.2 degrees F oral Pulse rate:   72 / minute Resp:     20 per minute BP sitting:   150 / 102  (left arm) Cuff size:   large  Vitals Entered By: Shonna Chock (June 21, 2007 9:04 AM)  Menstrual History: LMP (date): 06/04/2007               Last PAP Date 12/20/2004   PCP:  Laury Axon  Chief Complaint:  CPX WITH FASTING LABS AND PAP.  History of Present Illness: Pt here for CPE and pap.  Pt with no complaints except she has not taking Bp meds in 1 week because she could not afford copay.    Hypertension Follow-Up      This is a 50 year old woman who presents for Hypertension follow-up.  The patient denies lightheadedness, urinary frequency, headaches, edema, impotence, rash, and fatigue.  The patient denies the following associated symptoms: chest pain, chest pressure, exercise intolerance, dyspnea, palpitations, syncope, leg edema, and pedal edema.  Compliance with medications (by patient report) has been sporadic.  The patient reports that dietary compliance has been poor.  The patient reports no exercise.  Adjunctive measures currently used by the patient include salt restriction.    Current Allergies: ! 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  Eyes      Denies blurring, discharge, double vision, eye irritation, eye pain, halos, itching, light sensitivity, red eye, vision loss-1 eye, and vision loss-both eyes.      optho- q 1year  ENT      Denies decreased hearing, difficulty swallowing, ear discharge, earache, hoarseness, nasal congestion, nosebleeds, postnasal drainage, ringing in ears, sinus pressure, and sore throat.      dentist--  due  CV      Denies bluish discoloration of lips or nails, chest pain or discomfort, difficulty breathing at night, difficulty breathing while lying down, fainting, fatigue, leg cramps with exertion, lightheadness, near fainting, palpitations, shortness of breath with exertion, swelling  of feet, swelling of hands, and weight gain.  Resp      Denies chest discomfort, chest pain with inspiration, cough, coughing up blood, excessive snoring, hypersomnolence, morning headaches, pleuritic, shortness of breath, sputum productive, and wheezing.  GI      Denies abdominal pain, bloody stools, change in bowel habits, constipation, dark tarry stools, diarrhea, excessive appetite, gas, hemorrhoids, indigestion, loss of appetite, nausea, vomiting, vomiting blood, and yellowish skin color.  GU      Denies abnormal vaginal bleeding, decreased libido, discharge, dysuria, genital sores, hematuria, incontinence, nocturia, urinary frequency, and urinary hesitancy.  MS      Denies joint pain, joint redness, joint swelling, loss of strength, low back pain, mid back pain, muscle aches, muscle , cramps, muscle weakness, stiffness, and  thoracic pain.  Derm      Denies changes in color of skin, changes in nail beds, dryness, excessive perspiration, flushing, hair loss, insect bite(s), itching, lesion(s), poor wound healing, and rash.  Neuro      Denies brief paralysis, difficulty with concentration, disturbances in coordination, falling down, headaches, inability to speak, memory loss, numbness, poor balance, seizures, sensation of room spinning, tingling, tremors, visual disturbances, and weakness.  Psych      Denies alternate hallucination ( auditory/visual), anxiety, depression, easily angered, easily tearful, irritability, mental problems, panic attacks, sense of great danger, suicidal thoughts/plans, thoughts of violence, unusual visions or sounds, and thoughts /plans of harming others.  Endo      Denies cold intolerance, excessive hunger, excessive thirst, excessive urination, heat intolerance, polyuria, and weight change.  Heme      Denies abnormal bruising, bleeding, enlarge lymph nodes, fevers, pallor, and skin discoloration.  Allergy      Denies hives or rash, itching eyes, persistent infections, seasonal allergies, and sneezing.     Impression & Recommendations:  Problem # 1:  PREVENTIVE HEALTH CARE (ICD-V70.0) SBE, CA check mammo GHM utd Orders: Venipuncture ([address]) TLB-Lipid Panel (80061-LIPID) TLB-BMP (Basic Metabolic Panel-BMET) (80048-METABOL) TLB-CBC Platelet - w/Differential (85025-CBCD) TLB-Hepatic/Liver Function Pnl (80076-HEPATIC) TLB-TSH (Thyroid Stimulating Hormone) (98119-JYN) Radiology Referral (Radiology)   Problem # 2:  MOLE (ICD-216.9) Assessment: Unchanged  Orders: Misc. Referral (Misc.  plastic surgeon   Problem # 3:  HYPERTENSION (ICD-401.9) D/W pt importance of taking meds and not running out---  pt understands--- rechech in 2 weeks, check labs Her updated medication list for this problem includes:    Maxzide-25 37.5-25 Mg Tabs (Triamterene-hctz)    Norvasc 10 Mg Tabs  (Amlodipine besylate) .Marland Kitchen... 1 by mouth once daily  Orders: Venipuncture ([address]) TLB-Lipid Panel (80061-LIPID) TLB-BMP (Basic Metabolic Panel-BMET) (80048-METABOL) TLB-CBC Platelet - w/Differential (85025-CBCD) TLB-Hepatic/Liver Function Pnl (80076-HEPATIC) TLB-TSH (Thyroid Stimulating Hormone) (84443-TSH)  BP today: 150/102 Prior BP: 142/98 (04/05/2007)  Labs Reviewed: Creat: 0.7 (04/05/2007) Chol: 143 (04/05/2007)   HDL: 50.5 (04/05/2007)   LDL: 72 (04/05/2007)   TG: 101 (04/05/2007)   Problem # 4:  ASTHMA (ICD-493.90)  Her updated medication list for this problem includes:    Advair Diskus 250-50 Mcg/dose Misc (Fluticasone-salmeterol) .Marland Kitchen... 1 inh two times a day    Proair Hfa 108 (90 Base) Mcg/act Aers (Albuterol sulfate) .Marland Kitchen... 2  puffs qid as needed   Problem # 5:  HEMOCCULT POSITIVE STOOL (ICD-578.1) May be contaminated from pap-- few ext hem. but not bleeding check stool cards at home GI if positive  Complete Medication List: 1)  Maxzide-25 37.5-25 Mg Tabs (Triamterene-hctz) 2)  Augmentin 875-125 Mg Tabs (Amoxicillin-pot clavulanate) .Marland Kitchen.. 1 by mouth two times a  day 3)  Veramyst 27.5 Mcg/spray Susp (Fluticasone furoate) .... 2 sprays each nostril once daily 4)  Advair Diskus 250-50 Mcg/dose Misc (Fluticasone-salmeterol) .Marland Kitchen.. 1 inh two times a day 5)  Proair Hfa 108 (90 Base) Mcg/act Aers (Albuterol sulfate) .... 2  puffs qid as needed 6)  Norvasc 10 Mg Tabs (Amlodipine besylate) .Marland Kitchen.. 1 by mouth once daily   Patient Instructions: 1)  Please schedule a follow-up appointment in 2 weeks.    Prescriptions: NORVASC 10 MG  TABS (AMLODIPINE BESYLATE) 1 by mouth once daily  #30 x 2   Entered and Authorized by:   Loreen Freud DO   Signed by:   Loreen Freud DO on 06/21/2007   Method used:   Electronically sent to ...       [facility] [address]       [address] [place], [place]  [address]       Ph: [phone]       Fax: [phone]   RxID:    [id number] MAXZIDE-25 37.5-25 MG TABS (TRIAMTERENE-HCTZ)   #30 x 3   Entered and Authorized by:   Loreen Freud DO   Signed by:   Loreen Freud DO on 06/21/2007   Method used:   Electronically sent to ...       [facility] [address]       [address] [place], [place]  [address]       Ph: [phone]       Fax: [phone]   RxID:   [id number] MAXZIDE-25 37.5-25 MG TABS (TRIAMTERENE-HCTZ)   #30 x 3   Entered and Authorized by:   Loreen Freud DO   Signed by:   Loreen Freud DO on 06/21/2007   Method used:   Print then Give to Patient   RxID:   [id number] PROAIR HFA 108 (90 BASE) MCG/ACT  AERS (ALBUTEROL SULFATE) 2  puffs qid as needed  #1 x 2   Entered and Authorized by:   Loreen Freud DO   Signed by:   Loreen Freud DO on 06/21/2007   Method used:   Electronically sent to ...       [facility] [address]*       [address] [place], [place]  [address]       Ph: [phone]       Fax: [phone]   RxID:   [id number] ADVAIR DISKUS 250-50 MCG/DOSE  MISC (FLUTICASONE-SALMETEROL) 1 inh two times a day  #1 x 5   Entered and Authorized by:   Loreen Freud DO   Signed by:   Loreen Freud DO on 06/21/2007   Method used:   Electronically sent to ...       [facility] [address]*       [address] [place], [place]  [address]       Ph: [phone]       Fax: [phone]   RxID:   [id number]    Tetanus/Td Immunization History:    Tetanus/Td # 1:  Td (04/12/2006)   Appended Document: CPX & LAB/CBS  Laboratory Results   Urine Tests  Date/Time Recieved: ..................................................................Marland KitchenJacobs Engineering  June 21, 2007 11:57 AM   Routine Urinalysis   Color: yellow Appearance: Clear Glucose: negative   (Normal Range: Negative) Bilirubin: negative   (Normal Range: Negative) Ketone: negative   (Normal Range: Negative) Spec. Gravity: <1.005    (Normal Range: 1.003-1.035) Blood: negative   (Normal Range: Negative) pH: 8.0   (Normal Range: 5.0-8.0) Protein: negative   (Normal Range: Negative) Urobilinogen: negative   (Normal Range: 0-1) Nitrite: negative   (Normal Range: Negative) Leukocyte Esterace: negative   (Normal Range: Negative)

## 2010-11-16 NOTE — Letter (Signed)
Summary: Results Follow up Letter  [place] [facility]  [address] [place], [place] [address]   Phone: [phone]  Fax: [phone]    09/07/2007 MRN: [id number]  [facility] [address] [place], [place]  [address]  Dear Heather Walker,  The following are the results of your recent test(s):  Test         Result    Pap Smear:        Normal _____  Not Normal _____ Comments: ______________________________________________________ Cholesterol: LDL(Bad cholesterol):         Your goal is less than:         HDL (Good cholesterol):       Your goal is more than: Comments:  ______________________________________________________ Mammogram:        Normal _____  Not Normal _____ Comments:  ___________________________________________________________________ Hemoccult:        Normal _____  Not normal _______ Comments:    _____________________________________________________________________ Other Tests:  Urine Culture was normal :)   We routinely do not discuss normal results over the telephone.  If you desire a copy of the results, or you have any questions about this information we can discuss them at your next office visit.   Sincerely,

## 2010-11-16 NOTE — Progress Notes (Signed)
Summary: Needs OV  Phone Note Outgoing Call   Call placed by: Army Fossa CMA,  January 25, 2010 9:23 AM Summary of Call: We received paperwork on Pt to fill out. Per Dr.Lowne pt needs an office visit. I left the pt a message to return the phone call. Army Fossa CMA  January 25, 2010 9:24 AM   Follow-up for Phone Call        Pt has an OV for Friday. Army Fossa CMA  January 26, 2010 4:32 PM      Appended Document: Needs OV    Phone Note From Other Clinic   Caller: allsup Summary of Call: allsup VM left stating that paper work and questionnaire faxed over on 01-20-10 for Dr.Lowne to complete. Pls call to confirm that paper work and questionnaire  received. [phone] YNW#[id number]. return call inform them all items were received just awaiting OV to complete forms. per allsup will f/u after pt appt for status...........Marland KitchenFelecia Deloach CMA  January 27, 2010 4:10 PM

## 2010-11-16 NOTE — Letter (Signed)
Summary: Heather Walker at [facility]  [address] [place], [place] [address]   Phone: [phone]  Fax: [phone]    January 21, 2010  Chief [address] [place]  [address]  [place], [place] [address]  MW:[id number]  Juror: #_[id number]_   Dear [name]:   [name] of [place], [place] has just informed me that he has been chosen to serve on the jury beginning the 18th day of May, 2011.  [name] is a patient under my care with the diagnosos of severe depression. I do not feel that [name] should serve on the jury. I would like to request that [name] be excused from jury duty permanently.  Your consideration of this matter is greatly appreciated.  Respectfully,    Loreen Freud, DO

## 2010-11-16 NOTE — Assessment & Plan Note (Signed)
Summary: EVALUATION FOR HANDICAP STICKER//FD///SPH   Vital Signs:  Patient profile:   50 year old female Temp:     98.3 degrees F oral Pulse rate:   76 / minute Pulse rhythm:   regular BP sitting:   130 / 80  (left arm) Cuff size:   large  Vitals Entered By: Army Fossa CMA (September 17, 2009 2:42 PM) CC: Evaluation for handicap sticker.    History of Present Illness: Pt here for handicap sticker.  Pt leg gave out on her 10/17 2010 in church.  She had sharp pain in R hip  Current Medications (verified): 1)  Maxzide-25 37.5-25 Mg Tabs (Triamterene-Hctz) .Marland Kitchen.. 1 By Mouth Once Daily 2)  Advair Diskus 250-50 Mcg/dose  Misc (Fluticasone-Salmeterol) .Marland Kitchen.. 1 Inh Two Times A Day  Allergies: 1)  ! Codeine  Past History:  Past medical, surgical, family and social histories (including risk factors) reviewed for relevance to current acute and chronic problems.  Past Medical History: Reviewed history from 06/21/2007 and no changes required. Hypertension Asthma Allergic rhinitis  Past Surgical History: Reviewed history from 06/21/2007 and no changes required. Appendectomy Caesarean section X3  Family History: Reviewed history from 06/21/2007 and no changes required. Family History of Cervical cancer Family History Depression Family History Hypertension Sister--- hole in heart  Social History: Reviewed history from 08/14/2009 and no changes required. 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  Pt in power chair because of increased pain with walking and weakness + mild weakness R leg Neurologic:  DTRs symmetrical and normal.  see above Skin:   Intact without suspicious lesions or rashes Psych:  Cognition and judgment appear intact. Alert and cooperative with normal attention span and concentration. No apparent delusions, illusions, hallucinations   Impression & Recommendations:  Problem # 1:  BACK PAIN, LUMBAR, WITH RADICULOPATHY (ICD-724.4) handicap form filled out The following medications were removed from the medication list:    Hydrocodone-acetaminophen 5-500 Mg Tabs (Hydrocodone-acetaminophen) ..... One or two by mouth every 6 hours as needed for pain  Discussed use of moist heat or ice, modified activities, medications, and stretching/strengthening exercises. Back care instructions given. To be seen in 2 weeks if no improvement; sooner if worsening of symptoms.  Pt has seen ortho but can not remeber who----she thinks it was [facility]---  we will get records---pt needs f/u  Problem # 2:  CALF PAIN, RIGHT (ICD-729.5)  Orders: Doppler Referral (Doppler)  Complete Medication List: 1)  Maxzide-25 37.5-25 Mg Tabs (Triamterene-hctz) .Marland Kitchen.. 1 by mouth once daily 2)  Advair Diskus 250-50 Mcg/dose Misc (Fluticasone-salmeterol) .Marland Kitchen.. 1 inh two times a day

## 2010-11-16 NOTE — Progress Notes (Signed)
Summary: lowne--refill  Phone Note Call from Patient Call back at [phone]   Caller: Patient Reason for Call: Refill Medication Summary of Call: Patient is calling for a prescription for Maxzide 25 mg to be  call into the [facility] on [address]. Initial call taken by: Freddy Jaksch,  September 17, 2008 11:08 AM  Follow-up for Phone Call        Spoke with pt to inform her ofice visit is due last seen 2008, pt says she has no insurance and cannot afford ov, Per Dr Laury Axon pt will need to be seen to check her electrolytes etc , if cannot come her can refer to [facility] as an option, pt informed pt says she really would like to stay wit Dr Laury Axon, pt given up front to discuss options  and to schedule office visit  a 30 day supply faxed to [facility] .Kandice Hams  September 17, 2008 12:19 PM\par  Follow-up by: Kandice Hams,  September 17, 2008 12:19 PM      Prescriptions: MAXZIDE-25 37.5-25 MG TABS (TRIAMTERENE-HCTZ) 1 by mouth once daily  #30 Tablet x 0   Entered by:   Kandice Hams   Authorized by:   Loreen Freud DO   Signed by:   Kandice Hams on 09/17/2008   Method used:   Faxed to ...       [facility]       [address]       [place], [place]  [address]       Ph: [phone]       Fax: [phone]   RxID:   [id number]

## 2010-11-16 NOTE — Progress Notes (Signed)
Summary: handicap sticker  Phone Note Call from Patient Call back at Home Phone [phone]   Caller: Patient Summary of Call: pt left VM wanting to know if she will qualify for a handicap sticker and if so what does she need to do in order to get one..............Marland KitchenFelecia Deloach CMA  September 14, 2009 4:47 PM   Follow-up for Phone Call        we have application here---is pain no better?  has she seen ortho or neurosurgeon for back in past?   Follow-up by: Loreen Freud DO,  September 14, 2009 5:27 PM  Additional Follow-up for Phone Call Additional follow up Details #1::        pt states that she is still experiencing pain it just comes and goes now. pt has seen a neurosurgeon but it was years ago. pt has pending appt for thursday for evaluation for handicap sticker..................Marland KitchenFelecia Deloach CMA  September 15, 2009 9:32 AM

## 2010-11-16 NOTE — Letter (Signed)
Summary: Results Follow up Letter  [place] [facility]  [address] [place], [place] [address]   Phone: [phone]  Fax: [phone]    06/28/2007 MRN: [id number]  Heather Walker [address] [place], [place]  [address]  Dear Ms. Go,  The following are the results of your recent test(s):  Test         Result    Pap Smear:        Normal __X___  Not Normal _____ Comments: ______________________________________________________ Cholesterol: LDL(Bad cholesterol):         Your goal is less than:         HDL (Good cholesterol):       Your goal is more than: Comments:  ______________________________________________________ Mammogram:        Normal _____  Not Normal _____ Comments:  ___________________________________________________________________ Hemoccult:        Normal _____  Not normal _______ Comments:    _____________________________________________________________________ Other Tests:    We routinely do not discuss normal results over the telephone.  If you desire a copy of the results, or you have any questions about this information we can discuss them at your next office visit.   Sincerely,

## 2010-11-16 NOTE — Progress Notes (Signed)
Summary: Heather Walker fyi  Phone Note Outgoing Call   Summary of Call: pt states that she is doing alot better now, shot did help.Felecia Deloach CMA  November 14, 2008 2:21 PM

## 2010-11-16 NOTE — Letter (Signed)
Summary: [facility]-- OFFICE NOTE  [facility] OFFICE [facility]   Imported By: Freddy Jaksch 10/31/2007 11:45:20  _____________________________________________________________________  External Attachment:    Type:   Image     Comment:   External Document

## 2010-11-16 NOTE — Letter (Signed)
Summary: [facility]   Imported By: Freddy Jaksch 02/05/2008 11:36:16  _____________________________________________________________________  External Attachment:    Type:   Image     Comment:   External Document

## 2010-11-16 NOTE — Letter (Signed)
Summary: Heather Walker   Imported By: Freddy Jaksch 09/10/2007 10:14:53  _____________________________________________________________________  External Attachment:    Type:   Image     Comment:   External Document

## 2010-11-16 NOTE — Letter (Signed)
Summary: Encounter Notice/[facility]  Encounter Notice/[facility]   Imported By: Lanelle Bal 10/15/2009 14:25:25  _____________________________________________________________________  External Attachment:    Type:   Image     Comment:   External Document

## 2010-11-16 NOTE — Miscellaneous (Signed)
Summary: Orders Update  Clinical Lists Changes  Problems: Added new problem of CALF PAIN, RIGHT (ICD-729.5) Orders: Added new Test order of Venous Duplex Lower Extremity (Venous Duplex Lower) - Signed 

## 2010-11-16 NOTE — Progress Notes (Signed)
Summary: new rx  Phone Note Call from Patient   Caller: Patient Reason for Call: Refill Medication Summary of Call: dr. Laury Axon [phone] [facility] [employer] pt was given samples of nexium and she like the medication and would like to have it called to the pharmacy.  Initial call taken by: Charolette Child,  May 08, 2007 1:22 PM  Follow-up for Phone Call        RX FAXED-PT AWARE Follow-up by: Shonna Chock,  May 08, 2007 1:36 PM    New/Updated Medications: NEXIUM 40 MG  CPDR (ESOMEPRAZOLE MAGNESIUM)   Prescriptions: NEXIUM 40 MG  CPDR (ESOMEPRAZOLE MAGNESIUM) 1 by mouth once daily  #30 x 3   Entered by:   Shonna Chock   Authorized by:   Loreen Freud DO   Signed by:   Shonna Chock on 05/08/2007   Method used:   Printed then faxed to ...         RxID:   [id number]

## 2010-11-16 NOTE — Assessment & Plan Note (Signed)
Summary: DEPRESSION MED/RH......Marland Kitchen   Vital Signs:  Patient profile:   50 year old female Temp:     98.1 degrees F oral Pulse rate:   82 / minute Pulse rhythm:   regular BP sitting:   126 / 80  (left arm) Cuff size:   regular  Vitals Entered By: Army Fossa CMA (December 22, 2009 1:35 PM) CC: Pt here to discuss an antidepressant, she is experiencing a depressed mood, lack of pleasure, decreased appetitie, unable to sleep, lack of energy, feelings of worthlessness, trouble concentrating, trouble making decisions. , Depressive symptoms   History of Present Illness: Pt here f/u ER--pt seeing Therapist with Family services---  Heather Walker.  She was Dx with Major depressive disorder, recurrent , severe.  Depressive symptoms      This is a 50 year old woman who presents with Depressive symptoms.  The patient reports depressed mood, loss of interest/pleasure, and insomnia.  The patient also reports fatigue or loss of energy, feelings of worthlessness, diminished concentration, and thoughts of suicide.  The patient denies suicidal intent and suicidal plans.  The patient reports the following psychosocial stressors: recent marital separation and major life changes.  The patient denies abnormally elevated mood, abnormally irritable mood, decreased need for sleep, increased talkativeness, distractibility, flight of ideas, increased goal-directed activity, and inflated self-esteem/ grandiosity.  Pt had suicidal ideation when she called a few weeks ago but now pt is in counseling and while still very depressed she states she would not hurt herself.  Allergies: 1)  ! Codeine  Physical Exam  General:  Well-developed,well-nourished,in no acute distress; alert,appropriate and cooperative throughout examination Psych:  Oriented X3, depressed affect, subdued, poor eye contact, and tearful.     Impression & Recommendations:  Problem # 1:  DEPRESSION, MAJOR, RECURRENT, SEVERE (ICD-296.33) LEXAPRO 10  MG once daily  CONT PSYCHOLOGY RTO 4 WEEKS OR SOONER as needed PT IS NOT SUICIDAL  Complete Medication List: 1)  Maxzide-25 37.5-25 Mg Tabs (Triamterene-hctz) .Marland Kitchen.. 1 by mouth once daily 2)  Advair Diskus 250-50 Mcg/dose Misc (Fluticasone-salmeterol) .Marland Kitchen.. 1 inh two times a day 3)  Ultram 50 Mg Tabs (Tramadol hcl) 4)  Lexapro 10 Mg Tabs (Escitalopram oxalate) .Marland Kitchen.. 1 by mouth once daily  Patient Instructions: 1)  RTO 4 WEEKS OR SOONER as needed

## 2010-11-16 NOTE — Progress Notes (Signed)
Summary: PLZ REFILL MED IN CORRECT PHARMACY  Phone Note Call from Patient Call back at Home Phone [phone]   Caller: Patient Reason for Call: Refill Medication Summary of Call: DR LOWNE/  PT CALLED SAYING SHE DID NOT GET HER MED. PT MEDICATION WAS SENT TO THE WRONG PHARMACY. THE CORRECT PHARMACY IS [facility] [place] Initial call taken by: Job Founds,  December 31, 2007 12:51 PM      Prescriptions: MAXZIDE-25 37.5-25 MG TABS (TRIAMTERENE-HCTZ) 1 by mouth once daily  #30 x 3   Entered by:   Shonna Chock   Authorized by:   Loreen Freud DO   Signed by:   Shonna Chock on 12/31/2007   Method used:   Electronically sent to ...       [facility] [id number]       [address]       [place], [place]  [address]       Ph: [phone]       Fax: [phone]   RxID:   [id number]

## 2010-11-16 NOTE — Progress Notes (Signed)
Summary: maxzide refill  Phone Note Refill Request Message from:  Patient on April 25, 2008 4:24 PM  Refills Requested: Medication #1:  MAXZIDE-25 37.5-25 MG TABS 1 by mouth once daily [facility]  Initial call taken by: Doristine Devoid,  April 25, 2008 4:24 PM  Follow-up for Phone Call        patient informed office visit is due says she will callback to schedule..........Marland KitchenDoristine Devoid  April 25, 2008 4:25 PM       Prescriptions: MAXZIDE-25 37.5-25 MG TABS (TRIAMTERENE-HCTZ) 1 by mouth once daily  #30 x 0   Entered by:   Doristine Devoid   Authorized by:   Loreen Freud DO   Signed by:   Doristine Devoid on 04/25/2008   Method used:   Electronically sent to ...       [facility]       [address]       [place], [place]  [address]       Ph: [phone]       Fax: [phone]   RxID:   [id number]

## 2010-11-16 NOTE — Progress Notes (Signed)
Summary: Results [facility] 11/12)   Phone Note Outgoing Call   Summary of Call: Regarding results, LMTCB:  no change from previous as far as the spine there is a cyst on kidney that looks like it may be complex---refer to urology for further evaluation Initial call taken by: Army Fossa CMA,  August 28, 2009 3:01 PM  Follow-up for Phone Call        pt aware.  Follow-up by: Army Fossa CMA,  August 28, 2009 3:03 PM

## 2010-11-16 NOTE — Assessment & Plan Note (Signed)
Summary: roa 2 weeks,cbs   Vital Signs:  Patient Profile:   50 Years Old Female Height:     64.25 inches Weight:      219.8 pounds Pulse rate:   72 / minute BP sitting:   138 / 92  (left arm) Cuff size:   large  Vitals Entered By: Shonna Chock (July 05, 2007 10:40 AM)                 PCP:  Laury Axon  Chief Complaint:  2 WEEK F/U ON BLOODPRESSURE.  History of Present Illness:  Hypertension Follow-Up      This is a 50 year old woman who presents for Hypertension follow-up.  The patient denies lightheadedness, urinary frequency, headaches, edema, impotence, rash, and fatigue.  The patient denies the following associated symptoms: chest pain, chest pressure, exercise intolerance, dyspnea, palpitations, syncope, leg edema, and pedal edema.  Compliance with medications (by patient report) has been near 100%.  The patient reports that dietary compliance has been good.  Adjunctive measures currently used by the patient include salt restriction.    Current Allergies: ! CODEINE     Review of Systems      See HPI   Physical Exam  General:     Well-developed,well-nourished,in no acute distress; alert,appropriate and cooperative throughout examination Lungs:     Normal respiratory effort, chest expands symmetrically. Lungs are clear to auscultation, no crackles or wheezes. Heart:     Normal rate and regular rhythm. S1 and S2 normal without gallop, murmur, click, rub or other extra sounds. Extremities:     No clubbing, cyanosis, edema, or deformity noted with normal full range of motion of all joints.      Impression & Recommendations:  Problem # 1:  HYPERTENSION (ICD-401.9)  Her updated medication list for this problem includes:    Maxzide-25 37.5-25 Mg Tabs (Triamterene-hctz)    Norvasc 10 Mg Tabs (Amlodipine besylate) .Marland Kitchen... 1 by mouth once daily    Toprol Xl 50 Mg Tb24 (Metoprolol succinate) .Marland Kitchen... 1 by mouth once daily  BP today: 138/92 Prior BP: 150/102  (06/21/2007)  Labs Reviewed: Creat: 0.5 (06/21/2007) Chol: 163 (06/21/2007)   HDL: 60.3 (06/21/2007)   LDL: 85 (06/21/2007)   TG: 87 (06/21/2007)   Complete Medication List: 1)  Maxzide-25 37.5-25 Mg Tabs (Triamterene-hctz) 2)  Augmentin 875-125 Mg Tabs (Amoxicillin-pot clavulanate) .Marland Kitchen.. 1 by mouth two times a day 3)  Veramyst 27.5 Mcg/spray Susp (Fluticasone furoate) .... 2 sprays each nostril once daily 4)  Advair Diskus 250-50 Mcg/dose Misc (Fluticasone-salmeterol) .Marland Kitchen.. 1 inh two times a day 5)  Proair Hfa 108 (90 Base) Mcg/act Aers (Albuterol sulfate) .... 2  puffs qid as needed 6)  Norvasc 10 Mg Tabs (Amlodipine besylate) .Marland Kitchen.. 1 by mouth once daily 7)  K-lor 20 Meq (potassium Chloride)  .Marland Kitchen.. 1 by mouth once daily 8)  Toprol Xl 50 Mg Tb24 (Metoprolol succinate) .Marland Kitchen.. 1 by mouth once daily     Prescriptions: TOPROL XL 50 MG  TB24 (METOPROLOL SUCCINATE) 1 by mouth once daily  #30 x 2   Entered and Authorized by:   Loreen Freud DO   Signed by:   Loreen Freud DO on 07/05/2007   Method used:   Electronically sent to ...       [facility] [address]       [address]       [place], [place]  [address]  Ph: [phone]       Fax: [phone]   RxID:   [id number]  ]

## 2010-11-16 NOTE — Assessment & Plan Note (Signed)
Summary: TO FILL OUT PAPERWORK//PH   Vital Signs:  Patient profile:   50 year old female Height:      64.25 inches Pulse rate:   85 / minute Pulse rhythm:   regular BP sitting:   130 / 84  (left arm) Cuff size:   regular  Vitals Entered By: Army Fossa CMA (January 29, 2010 11:08 AM)  History of Present Illness: Pt here to fill out disability papers only.  Pt still with no insurance.  Pt applying for medicaid.  She has not been able to see urologist because of no insurance.  Pt needs neurosurgery as well for her back. Pt in wheelchair in room because she has so much pain with walking.    Current Medications (verified): 1)  Maxzide-25 37.5-25 Mg Tabs (Triamterene-Hctz) .Marland Kitchen.. 1 By Mouth Once Daily 2)  Advair Diskus 250-50 Mcg/dose  Misc (Fluticasone-Salmeterol) .Marland Kitchen.. 1 Inh Two Times A Day 3)  Ultram 50 Mg Tabs (Tramadol Hcl) 4)  Lexapro 10 Mg Tabs (Escitalopram Oxalate) .Marland Kitchen.. 1 By Mouth Once Daily  Allergies: 1)  ! Codeine  Past History:  Past medical, surgical, family and social histories (including risk factors) reviewed for relevance to current acute and chronic problems.  Past Medical History: Reviewed history from 06/21/2007 and no changes required. Hypertension Asthma Allergic rhinitis  Past Surgical History: Reviewed history from 06/21/2007 and no changes required. Appendectomy Caesarean section X3  Family History: Reviewed history from 06/21/2007 and no changes required. Family History of Cervical cancer Family History Depression Family History Hypertension Sister--- hole in heart  Social History: Reviewed history from 08/14/2009 and no changes required. 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     Impression & Recommendations:  Problem # 1:  DEPRESSION, MAJOR, RECURRENT, SEVERE (ICD-296.33) con't lexapro   Problem # 2:  BACK PAIN, LUMBAR, WITH RADICULOPATHY (ICD-724.4) Pt needs referral to neurosurgery--- pt waitning for medicaid Her updated medication list for this problem includes:    Ultram 50 Mg Tabs (Tramadol hcl)  Discussed use of moist heat or ice, modified activities, medications, and stretching/strengthening exercises. Back care instructions given. To be seen in 2 weeks if no improvement; sooner if worsening of symptoms.   Pt here for > 25 min to fill out paper work  Problem # 3:  RENAL CYST (ICD-593.2) pt needs to see urology---pt aware  she is waiting for medicaid   Complete Medication List: 1)  Maxzide-25 37.5-25 Mg Tabs (Triamterene-hctz) .Marland Kitchen.. 1 by mouth once daily 2)  Advair Diskus 250-50 Mcg/dose Misc (Fluticasone-salmeterol) .Marland Kitchen.. 1 inh two times a day 3)  Ultram 50 Mg Tabs (Tramadol hcl) 4)  Lexapro 10 Mg Tabs (Escitalopram oxalate) .Marland Kitchen.. 1 by mouth once daily

## 2010-11-16 NOTE — Assessment & Plan Note (Signed)
Summary: arthritis and back pain/scm   Vital Signs:  Patient profile:   50 year old female Height:      64.25 inches Weight:      238 pounds Temp:     98.2 degrees F oral Pulse rate:   70 / minute Pulse rhythm:   regular BP sitting:   150 / 100  (left arm) Cuff size:   large  Vitals Entered By: Shary Decamp (August 14, 2009 3:17 PM)  CC: acute only Comments  - went to [facility] ED on 10/17 - lost use of rt leg  - pain down rt leg  - low back pain that radiates to her abd  - was rx'd prednisone, percocet .Marland KitchenMarland KitchenMarland KitchenShary Decamp  August 14, 2009 3:17 PM     History of Present Illness: having back pain issues x months on July 31 2009, the pain  become  worse: Intense pain around the waist that radiated to the right leg ( lateral aspect) now pain is more in the right lower back and right hip Along with the pain, she noted motor weakness on the right leg. At the ER, she was prescribed oxycodone prednisone. Symptoms have improved only a little  Current Medications (verified): 1)  Maxzide-25 37.5-25 Mg Tabs (Triamterene-Hctz) .Marland Kitchen.. 1 By Mouth Once Daily 2)  Advair Diskus 250-50 Mcg/dose  Misc (Fluticasone-Salmeterol) .Marland Kitchen.. 1 Inh Two Times A Day 3)  Proair Hfa 108 (90 Base) Mcg/act  Aers (Albuterol Sulfate) .... 2  Puffs Qid As Needed  Allergies (verified): 1)  ! Codeine  Past History:  Past Medical History: Reviewed history from 06/21/2007 and no changes required. Hypertension Asthma Allergic rhinitis  Past Surgical History: Reviewed history from 06/21/2007 and no changes required. Appendectomy Caesarean section X3  Social History: Reviewed history from 06/21/2007 and no changes required. 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 No bladder or bowel incontinence. No rash since the ER visits, she has been essentially homebound  Physical  Exam  General:  alert and overweight-appearing.   Lungs:  Normal respiratory effort, chest expands symmetrically. Lungs are clear to auscultation, no crackles or wheezes. Heart:  Normal rate and regular rhythm. S1 and S2 normal without gallop, murmur, click, rub or other extra sounds. Msk:  rotation of the hip n the right is somehow painful Extremities:  trace bilateral pretibial edema Neurologic:  has a difficult time moving. Left leg.  Motor strength is normal. Right leg.  Motor strength is decreased with some tremor. Reflexes are symmetric in the lower extremities. Positive straight leg test on the right   Impression & Recommendations:  Problem # 1:  LOW BACK PAIN, ACUTE (ICD-724.2)  acute exacerbation of back pain with radicular symptoms. I am concerned about her symptoms, I think she needs a  MRI and see a specialist to determine the exact cause of her sx the patient states that she has no insurance and will have a very hard time getting that accomplished. Will try to set up MRI. Will prescrib pain medication but again , patient know this is a "band aid approach" phone #s for [facility] provided    The following medications were removed from the medication list:    Orphenadrine Citrate Cr 100 Mg Xr12h-tab (Orphenadrine citrate) .Marland Kitchen... Take 1 tablet by mouth two times a day    Dilaudid 8 Mg Tabs (Hydromorphone hcl) .Marland Kitchen... 1 by mouth  every 4 hours as needed Her updated medication list for this problem includes:    Hydrocodone-acetaminophen 5-500 Mg Tabs (Hydrocodone-acetaminophen) ..... One or two by mouth every 6 hours as needed for pain  Orders: Radiology Referral (Radiology)  Problem # 2:  HYPERTENSION (ICD-401.9) BP slightly elevated today.  Likely due to pain The following medications were removed from the medication list:    Toprol Xl 50 Mg Tb24 (Metoprolol succinate) .Marland Kitchen... 1 by mouth once daily Her updated medication list for this problem includes:    Maxzide-25  37.5-25 Mg Tabs (Triamterene-hctz) .Marland Kitchen... 1 by mouth once daily  BP today: 150/100 Prior BP: 130/98 (02/18/2009)  Labs Reviewed: K+: 2.9 (06/21/2007) Creat: : 0.5 (06/21/2007)   Chol: 163 (06/21/2007)   HDL: 60.3 (06/21/2007)   LDL: 85 (06/21/2007)   TG: 87 (06/21/2007)  Complete Medication List: 1)  Maxzide-25 37.5-25 Mg Tabs (Triamterene-hctz) .Marland Kitchen.. 1 by mouth once daily 2)  Advair Diskus 250-50 Mcg/dose Misc (Fluticasone-salmeterol) .Marland Kitchen.. 1 inh two times a day 3)  Proair Hfa 108 (90 Base) Mcg/act Aers (Albuterol sulfate) .... 2  puffs qid as needed 4)  Hydrocodone-acetaminophen 5-500 Mg Tabs (Hydrocodone-acetaminophen) .... One or two by mouth every 6 hours as needed for pain  Patient Instructions: 1)  please come back and see your primary doctor in a month. 2)  ER if symptoms severe Prescriptions: HYDROCODONE-ACETAMINOPHEN 5-500 MG TABS (HYDROCODONE-ACETAMINOPHEN) one or two by mouth every 6 hours as needed for pain  #60 x 0   Entered and Authorized by:   Nolon Rod. Paz MD   Signed by:   Nolon Rod. Paz MD on 08/14/2009   Method used:   Print then Give to Patient   RxID:   [id number]

## 2010-11-16 NOTE — Progress Notes (Signed)
Summary: NEUROSURGERY REFERRAL  Phone Note Other Incoming   Summary of Call: FYI...Marland KitchenMarland KitchenIN REFERENCE TO NEUROSURGERY REFERRAL, A FAX WAS SENT TO OUR OFFICE TODAY FROM DR. NEAVE'S OFFICE/[facility], DR. NEAVE SAYS THERE IS NOTHING SURGICAL FOR THE PT TO BE SEEN BY THEM, AND THAT PT SHOULD BE REFERRED TO NEUROLOGY OR PAIN MGMT.  PLEASE ADVISE. Initial call taken by: Magdalen Spatz [facility],  March 23, 2009 5:45 PM  Follow-up for Phone Call        neuro referral put in----inform pt Follow-up by: Loreen Freud DO,  March 23, 2009 9:11 PM  Additional Follow-up for Phone Call Additional follow up Details #1::        I HAVE LEFT MESSAGE FOR PT TO CALL ME & WILL INFORM HER WHEN SHE RETURNS MY CALL.  MEANWHILE I WILL FAX REFERRAL INFO TO NEUROLOGY. Additional Follow-up by: Magdalen Spatz [facility],  March 24, 2009 8:13 AM    Additional Follow-up for Phone Call Additional follow up Details #2::    PER PT'S NEW INSURANCE, [employer], DR LEWIT & DR MICHAEL APPLEGATE, ARE IN-NETWORK.  I SPOKE W/PT, SHE PREFERS DR. Clarisse Gouge BECAUSE HE IS LOCATED IN [place] & DR APPLEGATE IN [place].  REFERRAL FAXED TO DR LEWIT'S OFFICE & PT IS AWARE. Follow-up by: Magdalen Spatz [facility],  March 24, 2009 4:49 PM

## 2010-11-16 NOTE — Assessment & Plan Note (Signed)
Summary: ovaries hurting acute only//tl   Vital Signs:  Patient Profile:   50 Years Old Female Height:     64.25 inches Weight:      221.0 pounds Temp:     98.9 degrees F oral Pulse rate:   72 / minute BP sitting:   130 / 86  (left arm) Cuff size:   //large  Vitals Entered By: Shonna Chock (September 03, 2007 4:16 PM)                 PCP:  Laury Axon  Chief Complaint:  PAIN IN OVARY AREA SINCE FRIDAY, PATIENT WENT TO ER ON FRIDAY, and NO BETTER.Marland Kitchen  History of Present Illness: Pt here with f/u from hospital. Pt feels no better.  Pt given Bactrim DS in Er for UtI.---  only 1 day left. Pt potassium was also low.  CT done and was normal except for fibroids.  [place] recc.   Current Allergies: ! CODEINE     Review of Systems      See HPI   Physical Exam  General:     Well-developed,well-nourished,in no acute distress; alert,appropriate and cooperative throughout examination Lungs:     Normal respiratory effort, chest expands symmetrically. Lungs are clear to auscultation, no crackles or wheezes. Heart:     Normal rate and regular rhythm. S1 and S2 normal without gallop, murmur, click, rub or other extra sounds. Abdomen:     soft, normal bowel sounds, no distention, and no masses.  + RLQ tenderness -- no rebound or guardingr Genitalia:     + white vaginal d/c--- No adnexal masses + tenderness on Right sidenormal introitus, no external lesions, and vaginal discharge.      Impression & Recommendations:  Problem # 1:  PELVIC PAIN, RIGHT (ICD-789.09)  Her updated medication list for this problem includes:    Ultram 50 Mg Tabs (Tramadol hcl) .Marland Kitchen... 1 by mouth every 6 hours as needed  Orders: Radiology Referral (Radiology) T-GC Probe, genital [phone]) T-Chlamydia Probe, genital [phone])   Problem # 2:  UTI (ICD-599.0)  The following medications were removed from the medication list:    Augmentin 875-125 Mg Tabs (Amoxicillin-pot clavulanate) .Marland Kitchen... 1 by mouth two  times a day  Her updated medication list for this problem includes:    Cipro 500 Mg Tabs (Ciprofloxacin hcl) .Marland Kitchen... 1 by mouth two times a day  Orders: T-Culture, Urine ([id number])  Encouraged to push clear liquids, get enough rest, and take acetaminophen as needed. To be seen in 10 days if no improvement, sooner if worse.   Problem # 3:  BACTERIAL VAGINITIS (ICD-616.10) genprobe done The following medications were removed from the medication list:    Augmentin 875-125 Mg Tabs (Amoxicillin-pot clavulanate) .Marland Kitchen... 1 by mouth two times a day  Her updated medication list for this problem includes:    Metrogel-vaginal 0.75 % Gel (Metronidazole) .Marland Kitchen... 1  applicator pv at bedtime for 5 days    Cipro 500 Mg Tabs (Ciprofloxacin hcl) .Marland Kitchen... 1 by mouth two times a day Discussed symptomatic relief and treatment options.  Orders: Wet Prep ([id number])   Problem # 4:  HYPOKALEMIA, HX OF (ICD-V12.2) pt on KCL q day--  increase to 2 a day re check next week   Complete Medication List: 1)  Maxzide-25 37.5-25 Mg Tabs (Triamterene-hctz) 2)  Veramyst 27.5 Mcg/spray Susp (Fluticasone furoate) .... 2 sprays each nostril once daily 3)  Advair Diskus 250-50 Mcg/dose Misc (Fluticasone-salmeterol) .Marland Kitchen.. 1 inh two times a day 4)  Proair  Hfa 108 (90 Base) Mcg/act Aers (Albuterol sulfate) .... 2  puffs qid as needed 5)  Norvasc 10 Mg Tabs (Amlodipine besylate) .Marland Kitchen.. 1 by mouth once daily 6)  Toprol Xl 50 Mg Tb24 (Metoprolol succinate) .Marland Kitchen.. 1 by mouth once daily 7)  Klor-con M20 20 Meq Tbcr (Potassium chloride crys cr) .Marland Kitchen.. 1 by mouth once daily 8)  Metrogel-vaginal 0.75 % Gel (Metronidazole) .Marland Kitchen.. 1  applicator pv at bedtime for 5 days 9)  Cipro 500 Mg Tabs (Ciprofloxacin hcl) .Marland Kitchen.. 1 by mouth two times a day 10)  Ultram 50 Mg Tabs (Tramadol hcl) .Marland Kitchen.. 1 by mouth every 6 hours as needed  Other Orders: UA Dipstick W/ Micro ([id number])     Prescriptions: ULTRAM 50 MG  TABS (TRAMADOL HCL) 1 by mouth every 6 hours  as needed  #30 x 0   Entered and Authorized by:   Loreen Freud DO   Signed by:   Loreen Freud DO on 09/03/2007   Method used:   Print then Give to Patient   RxID:   [id number] CIPRO 500 MG  TABS (CIPROFLOXACIN HCL) 1 by mouth two times a day  #10 x 0   Entered and Authorized by:   Loreen Freud DO   Signed by:   Loreen Freud DO on 09/03/2007   Method used:   Electronically sent to ...       [facility] [id number]       [address]       [place], [place]  [address]       Ph: [phone]       Fax: [phone]   RxID:   [id number] METROGEL-VAGINAL 0.75 %  GEL (METRONIDAZOLE) 1  applicator PV at bedtime for 5 days  #5 days x 0   Entered and Authorized by:   Loreen Freud DO   Signed by:   Loreen Freud DO on 09/03/2007   Method used:   Electronically sent to ...       [facility] [id number]       [address]       [place], [place]  [address]       Ph: [phone]       Fax: [phone]   RxID:   [id number]  ] Laboratory Results   Urine Tests   Date/Time Reported: September 03, 2007 4:53 PM   Routine Urinalysis   Color: yellow Appearance: Clear Glucose: negative   (Normal Range: Negative) Bilirubin: negative   (Normal Range: Negative) Ketone: negative   (Normal Range: Negative) Spec. Gravity: <1.005   (Normal Range: 1.003-1.035) Blood: negative   (Normal Range: Negative) pH: 6.0   (Normal Range: 5.0-8.0) Protein: negative   (Normal Range: Negative) Urobilinogen: negative   (Normal Range: 0-1) Nitrite: negative   (Normal Range: Negative) Leukocyte Esterace: moderate   (Normal Range: Negative)    Date/Time Received: September 03, 2007 5:13 PM  Date/Time Reported: September 03, 2007 5:13 PM   Wet [place]/KOH Clue cells/hpf moderate   Appended Document: Orders Update    Clinical Lists Changes  Problems: Added new problem of FIBROIDS, UTERUS (ICD-218.9) - Signed Orders: Added new Referral order of  Gynecologic Referral (Gyn) - Signed

## 2010-11-16 NOTE — Progress Notes (Signed)
Summary: refill  Phone Note Refill Request Message from:  Fax from Pharmacy on May 28, 2010 9:13 AM  Refills Requested: Medication #1:  FLEXERIL 10 MG TABS 1 by mouth three times a day as needed. [facility] [place] fax [phone]  Initial call taken by: Okey Regal [place],  May 28, 2010 9:15 AM  Follow-up for Phone Call        LAST FILLED 03-30-10 #30..........Marland KitchenFelecia Deloach CMA  May 28, 2010 1:03 PM   Additional Follow-up for Phone Call Additional follow up Details #1::        ok for #30, no refills Additional Follow-up by: Neena Rhymes MD,  May 28, 2010 1:09 PM    Prescriptions: FLEXERIL 10 MG TABS (CYCLOBENZAPRINE HCL) 1 by mouth three times a day as needed  #30 x 0   Entered by:   Jeremy Johann CMA   Authorized by:   Neena Rhymes MD   Signed by:   Jeremy Johann CMA on 05/28/2010   Method used:   F[facility]ed to ...       [facility]       [address]       [place], [place]  [address]       Ph: [phone]       Fax: [phone]   RxID:   [id number]

## 2010-11-16 NOTE — Progress Notes (Signed)
Summary: NEED MEDICATION FOR MRI  Phone Note Outgoing Call Call back at Home Phone [phone]   Call placed by: Magdalen Spatz [facility],  August 17, 2009 9:11 AM Call placed to: Patient Summary of Call: I INFORMED PATIENT SHE IS SCH'D FOR HER MRI LS @ [facility] ON 08-19-09 & PATIENT STATE SHE IS CLAUSTROPHOBIC.  PLEASE SUBMIT MEDICATION FOR PATIENT TO [facility] ON [address]  PATIENT TO TAKE MED WITH HER TO MRI APPT & TECH WILL TELL HER WHEN TO TAKE.  PLEASE CONTACT PT ONCE MED SUBMITTED TO PHARMACY.   Initial call taken by: Magdalen Spatz [facility],  August 17, 2009 9:13 AM  Follow-up for Phone Call        ativan 0.5 mg  #10  as directed--no refills Follow-up by: Loreen Freud DO,  August 17, 2009 1:02 PM  Additional Follow-up for Phone Call Additional follow up Details #1::        left message, sent rx in to pharmacy.  Additional Follow-up by: Army Fossa CMA,  August 17, 2009 1:59 PM    New/Updated Medications: ATIVAN 0.5 MG TABS (LORAZEPAM) as directed. Prescriptions: ATIVAN 0.5 MG TABS (LORAZEPAM) as directed.  #10 x 0   Entered by:   Army Fossa CMA   Authorized by:   Loreen Freud DO   Signed by:   Army Fossa CMA on 08/17/2009   Method used:   Printed then faxed to ...       [facility]       [address]       [place], [place]  [address]       Ph: [phone]       Fax: [phone]   RxID:   [id number]

## 2010-11-16 NOTE — Progress Notes (Signed)
Summary: Rosita Fire pt to ED  Phone Note Call from Patient Call back at [phone]   Caller: Patient Summary of Call: Patient is stating she is having a nervous breakdown. Patient is going through marital problems. Please advise Initial call taken by: Barb Merino,  December 10, 2009 10:49 AM  Follow-up for Phone Call        pt called crying and hysterical c/o of being stressed out, shaking, nervous, and just feels like she about to break. pt states that she has a lot going on right now with family issues. pt unsure if she would hurts someone or herself. pt advise ED and per pt she has someone to take her ride not needed...............Marland KitchenFelecia Deloach CMA  December 10, 2009 11:04 AM   Additional Follow-up for Phone Call Additional follow up Details #1::        please check on pt later Additional Follow-up by: Loreen Freud DO,  December 10, 2009 11:26 AM    Additional Follow-up for Phone Call Additional follow up Details #2::    pt states that she has not made it to hospital yet. Pt states that she does feel a little better now but she is still planning on going to ED. Urge pt to ED and to given [place] a call if there is anything that we can do to help, pt ok...........Marland KitchenFelecia Deloach CMA  December 10, 2009 1:21 PM

## 2010-11-16 NOTE — Assessment & Plan Note (Signed)
Summary: sinus infection/alj   Vital Signs:  Patient Profile:   50 Years Old Female Weight:      221.0 pounds Temp:     98.7 degrees F oral Pulse rate:   72 / minute BP sitting:   142 / 98  (left arm)  Vitals Entered By: Shonna Chock (April 05, 2007 8:41 AM)             Comments PATIENT WAS HERE FOR LAB APPOINTMENT, THEN STATED THAT SHE NEEDED TO SEE THE DR.   PCP:  Laury Axon  Chief Complaint:  POSSIBLE SINUS INFECTION and URI symptoms.  History of Present Illness:  URI Symptoms      This is a 50 year old woman who presents with URI symptoms.  Pt here c/o sinus congestion since Sunday.  The patient reports nasal congestion, clear nasal discharge, sore throat, and dry cough, but denies purulent nasal discharge, productive cough, earache, and sick contacts.  Associated symptoms include fever and wheezing.  The patient denies stiff neck and dyspnea.  The patient also reports itchy watery eyes, itchy throat, sneezing, seasonal symptoms, headache, muscle aches, and severe fatigue.  The patient denies response to antihistamine.  Risk factors for Strep sinusitis include unilateral facial pain and unilateral nasal discharge.    Pt later c/o of chestpain and indigestion over the last several weeks.  +hx spicy food.  No radiation of pain--- not worse with exertion.   Hypertension Follow-Up      The patient also presents for Hypertension follow-up.  The patient denies lightheadedness, urinary frequency, headaches, edema, impotence, rash, and fatigue.  Associated symptoms include chest pain.  The patient denies the following associated symptoms: chest pressure, exercise intolerance, dyspnea, palpitations, syncope, leg edema, and pedal edema.  Compliance with medications (by patient report) has been near 100%.  The patient reports that dietary compliance has been good.  The patient reports exercising occasionally.  Adjunctive measures currently used by the patient include salt restriction.       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      Denies difficulty breathing at night, difficulty breathing while lying down, leg cramps with exertion, lightheadness, near fainting, and palpitations.  Resp      Complains of chest discomfort, cough, shortness of breath, and wheezing.      Denies sputum productive.  GI      Complains of gas and indigestion.   Physical Exam  General:     Well-developed,well-nourished,in no acute distress; alert,appropriate and cooperative throughout examination Head:     Normocephalic and atraumatic without obvious abnormalities. No apparent alopecia or balding. Eyes:     vision grossly intact, pupils equal, pupils round, and pupils reactive to light.   Ears:     External ear exam shows no significant lesions or deformities.  Otoscopic examination reveals clear canals, tympanic membranes are intact bilaterally without bulging, retraction, inflammation or  discharge. Hearing is grossly normal bilaterally. Nose:     L frontal sinus tenderness and L maxillary sinus tenderness.   Mouth:     Oral mucosa and oropharynx without lesions or exudates.  Teeth in good repair. Neck:     No deformities, masses, or tenderness noted. Lungs:     Normal respiratory effort, chest expands symmetrically. Lungs are clear to auscultation, no crackles or wheezes. Heart:     Normal rate and regular rhythm. S1 and S2 normal without gallop, murmur, click, rub or other extra sounds. Abdomen:     soft, non-tender, no distention, no masses,  no guarding, and no rebound tenderness.   Extremities:     No clubbing, cyanosis, edema, or deformity noted with normal full range of motion of all joints.      Impression & Recommendations:  Problem # 1:  HYPERTENSION (ICD-401.9) Pt did not take meds today ,,  labs drawn today    Maxzide-25 37.5-25 Mg Tabs (Triamterene-hctz) RTO 2-3 weeks Pt with chest pain today-- EKG nsr  --- go to er if symptoms return Her updated medication list for this problem includes:    Maxzide-25 37.5-25 Mg Tabs (Triamterene-hctz)   Problem # 2:  SINUSITIS, ACUTE NOS (ICD-461.9) Assessment: New  Her updated medication list for this problem includes:    Augmentin 875-125 Mg Tabs (Amoxicillin-pot clavulanate) .Marland Kitchen... 1 by mouth two times a day    Veramyst 27.5 Mcg/spray Susp (Fluticasone furoate) .Marland Kitchen... 2 sprays each nostril once daily  veramyst sample call or rto prn  Problem # 3:  REFLUX, ESOPHAGEAL (ICD-530.81) Assessment: New samples of Neium given--- call or rto prn   Discussed lifestyle modifications, diet, antacids/medications, and preventive measures. Her updated medication list for this problem includes:    Nexium 40 Mg Cpdr (Esomeprazole magnesium) .Marland Kitchen... 1 by mouth once daily   Medications Added to Medication List This Visit: 1)  Augmentin 875-125 Mg Tabs (Amoxicillin-pot clavulanate) .Marland Kitchen.. 1 by mouth two times a day 2)  Veramyst 27.5 Mcg/spray Susp (Fluticasone furoate) .... 2 sprays each nostril once daily 3)  Nexium 40 Mg Cpdr (Esomeprazole magnesium) .Marland Kitchen.. 1 by mouth once daily  Appended Document: ekg    Clinical Lists Changes  Observations: Added new observation of EKG INTERP: normal:  NSR 61bpm (04/05/2007 9:50)      EKG  Procedure date:  04/05/2007  Findings:      normal:  NSR 61bpm

## 2010-12-29 ENCOUNTER — Emergency Department (HOSPITAL_COMMUNITY)
Admission: EM | Admit: 2010-12-29 | Discharge: 2010-12-29 | Disposition: A | Discharge status: Home / Self Care | Payer: Medicaid Other | Attending: Emergency Medicine | Admitting: Emergency Medicine

## 2010-12-29 DIAGNOSIS — I10 Essential (primary) hypertension: Secondary | ICD-10-CM | POA: Insufficient documentation

## 2010-12-29 DIAGNOSIS — J45909 Unspecified asthma, uncomplicated: Secondary | ICD-10-CM | POA: Insufficient documentation

## 2010-12-29 DIAGNOSIS — M545 Low back pain, unspecified: Secondary | ICD-10-CM | POA: Insufficient documentation

## 2010-12-29 DIAGNOSIS — E669 Obesity, unspecified: Secondary | ICD-10-CM | POA: Insufficient documentation

## 2011-01-02 LAB — URINALYSIS, ROUTINE W REFLEX MICROSCOPIC
Bilirubin Urine: NEGATIVE
Glucose, UA: NEGATIVE mg/dL
Hgb urine dipstick: NEGATIVE
Ketones, ur: NEGATIVE mg/dL
Nitrite: POSITIVE — AB
Protein, ur: NEGATIVE mg/dL
Specific Gravity, Urine: 1.013 (ref 1.005–1.030)
Urobilinogen, UA: 0.2 mg/dL (ref 0.0–1.0)
pH: 6 (ref 5.0–8.0)

## 2011-01-02 LAB — URINE MICROSCOPIC-ADD ON

## 2011-01-17 LAB — CARDIAC PANEL(CRET KIN+CKTOT+MB+TROPI)
CK, MB: 0.5 ng/mL (ref 0.3–4.0)
CK, MB: 0.5 ng/mL (ref 0.3–4.0)
CK, MB: 0.6 ng/mL (ref 0.3–4.0)
Relative Index: INVALID (ref 0.0–2.5)
Relative Index: INVALID (ref 0.0–2.5)
Relative Index: INVALID (ref 0.0–2.5)
Total CK: 37 U/L (ref 7–177)
Total CK: 41 U/L (ref 7–177)
Total CK: 41 U/L (ref 7–177)
Troponin I: 0.01 ng/mL (ref 0.00–0.06)
Troponin I: 0.01 ng/mL (ref 0.00–0.06)
Troponin I: 0.01 ng/mL (ref 0.00–0.06)

## 2011-01-17 LAB — COMPREHENSIVE METABOLIC PANEL
ALT: 23 U/L (ref 0–35)
AST: 27 U/L (ref 0–37)
Albumin: 3.4 g/dL — ABNORMAL LOW (ref 3.5–5.2)
Alkaline Phosphatase: 89 U/L (ref 39–117)
BUN: 3 mg/dL — ABNORMAL LOW (ref 6–23)
CO2: 30 mEq/L (ref 19–32)
Calcium: 8.2 mg/dL — ABNORMAL LOW (ref 8.4–10.5)
Chloride: 97 mEq/L (ref 96–112)
Creatinine, Ser: 0.69 mg/dL (ref 0.4–1.2)
GFR calc Af Amer: >60 mL/min (ref >60)
GFR calc non Af Amer: >60 mL/min (ref >60)
Glucose, Bld: 115 mg/dL — ABNORMAL HIGH (ref 70–99)
Potassium: 3.2 mEq/L — ABNORMAL LOW (ref 3.5–5.1)
Sodium: 134 mEq/L — ABNORMAL LOW (ref 135–145)
Total Bilirubin: 0.6 mg/dL (ref 0.3–1.2)
Total Protein: 7 g/dL (ref 6.0–8.3)

## 2011-01-17 LAB — POCT CARDIAC MARKERS
CKMB, poc: <1 ng/mL — ABNORMAL LOW (ref 1.0–8.0)
CKMB, poc: <1 ng/mL — ABNORMAL LOW (ref 1.0–8.0)
Myoglobin, poc: 38.1 ng/mL (ref 12–200)
Myoglobin, poc: 53.2 ng/mL (ref 12–200)
Troponin i, poc: <0.05 ng/mL (ref 0.00–0.09)
Troponin i, poc: <0.05 ng/mL (ref 0.00–0.09)

## 2011-01-17 LAB — POCT I-STAT, CHEM 8
BUN: 4 mg/dL — ABNORMAL LOW (ref 6–23)
Calcium, Ion: 1.08 mmol/L — ABNORMAL LOW (ref 1.12–1.32)
Chloride: 101 mEq/L (ref 96–112)
Creatinine, Ser: 0.7 mg/dL (ref 0.4–1.2)
Glucose, Bld: 152 mg/dL — ABNORMAL HIGH (ref 70–99)
HCT: 40 % (ref 36.0–46.0)
Hemoglobin: 13.6 g/dL (ref 12.0–15.0)
Potassium: 2.9 mEq/L — ABNORMAL LOW (ref 3.5–5.1)
Sodium: 140 mEq/L (ref 135–145)
TCO2: 29 mmol/L (ref 0–100)

## 2011-01-17 LAB — BASIC METABOLIC PANEL
BUN: 5 mg/dL — ABNORMAL LOW (ref 6–23)
CO2: 30 mEq/L (ref 19–32)
Calcium: 8.5 mg/dL (ref 8.4–10.5)
Chloride: 103 mEq/L (ref 96–112)
Creatinine, Ser: 0.74 mg/dL (ref 0.4–1.2)
GFR calc Af Amer: >60 mL/min (ref >60)
GFR calc non Af Amer: >60 mL/min (ref >60)
Glucose, Bld: 135 mg/dL — ABNORMAL HIGH (ref 70–99)
Potassium: 3.2 mEq/L — ABNORMAL LOW (ref 3.5–5.1)
Sodium: 139 mEq/L (ref 135–145)

## 2011-01-17 LAB — CBC
HCT: 34 % — ABNORMAL LOW (ref 36.0–46.0)
HCT: 37.1 % (ref 36.0–46.0)
Hemoglobin: 11.6 g/dL — ABNORMAL LOW (ref 12.0–15.0)
Hemoglobin: 12.8 g/dL (ref 12.0–15.0)
MCHC: 34 g/dL (ref 30.0–36.0)
MCHC: 34.4 g/dL (ref 30.0–36.0)
MCV: 85.2 fL (ref 78.0–100.0)
MCV: 86.4 fL (ref 78.0–100.0)
Platelets: 209 10*3/uL (ref 150–400)
Platelets: 234 10*3/uL (ref 150–400)
RBC: 3.94 MIL/uL (ref 3.87–5.11)
RBC: 4.36 MIL/uL (ref 3.87–5.11)
RDW: 15.1 % (ref 11.5–15.5)
RDW: 15.3 % (ref 11.5–15.5)
WBC: 3.8 10*3/uL — ABNORMAL LOW (ref 4.0–10.5)
WBC: 4.5 10*3/uL (ref 4.0–10.5)

## 2011-01-17 LAB — DIFFERENTIAL
Basophils Absolute: 0 10*3/uL (ref 0.0–0.1)
Basophils Relative: 0 % (ref 0–1)
Eosinophils Absolute: 0.1 10*3/uL (ref 0.0–0.7)
Eosinophils Relative: 2 % (ref 0–5)
Lymphocytes Relative: 31 % (ref 12–46)
Lymphs Abs: 1.4 10*3/uL (ref 0.7–4.0)
Monocytes Absolute: 0.5 10*3/uL (ref 0.1–1.0)
Monocytes Relative: 12 % (ref 3–12)
Neutro Abs: 2.4 10*3/uL (ref 1.7–7.7)
Neutrophils Relative %: 54 % (ref 43–77)

## 2011-01-17 LAB — D-DIMER, QUANTITATIVE: D-Dimer, Quant: 0.24 ug/mL-FEU (ref 0.00–0.48)

## 2011-01-27 ENCOUNTER — Emergency Department (HOSPITAL_COMMUNITY)
Admission: EM | Admit: 2011-01-27 | Discharge: 2011-01-27 | Disposition: A | Discharge status: Home / Self Care | Payer: Medicaid Other | Attending: Emergency Medicine | Admitting: Emergency Medicine

## 2011-01-27 DIAGNOSIS — M546 Pain in thoracic spine: Secondary | ICD-10-CM | POA: Insufficient documentation

## 2011-01-27 DIAGNOSIS — M545 Low back pain, unspecified: Secondary | ICD-10-CM | POA: Insufficient documentation

## 2011-01-27 DIAGNOSIS — M129 Arthropathy, unspecified: Secondary | ICD-10-CM | POA: Insufficient documentation

## 2011-01-27 DIAGNOSIS — Z79899 Other long term (current) drug therapy: Secondary | ICD-10-CM | POA: Insufficient documentation

## 2011-01-27 DIAGNOSIS — I10 Essential (primary) hypertension: Secondary | ICD-10-CM | POA: Insufficient documentation

## 2011-01-27 DIAGNOSIS — G8929 Other chronic pain: Secondary | ICD-10-CM | POA: Insufficient documentation

## 2011-01-27 DIAGNOSIS — J45909 Unspecified asthma, uncomplicated: Secondary | ICD-10-CM | POA: Insufficient documentation

## 2011-01-31 LAB — URINALYSIS, ROUTINE W REFLEX MICROSCOPIC
Bilirubin Urine: NEGATIVE
Glucose, UA: NEGATIVE mg/dL
Hgb urine dipstick: NEGATIVE
Ketones, ur: NEGATIVE mg/dL
Nitrite: NEGATIVE
Protein, ur: NEGATIVE mg/dL
Specific Gravity, Urine: 1.011 (ref 1.005–1.030)
Urobilinogen, UA: 0.2 mg/dL (ref 0.0–1.0)
pH: 7.5 (ref 5.0–8.0)

## 2011-02-01 ENCOUNTER — Emergency Department (HOSPITAL_COMMUNITY)
Admission: EM | Admit: 2011-02-01 | Discharge: 2011-02-01 | Disposition: A | Discharge status: Home / Self Care | Payer: Medicaid Other | Attending: Emergency Medicine | Admitting: Emergency Medicine

## 2011-02-01 DIAGNOSIS — R221 Localized swelling, mass and lump, neck: Secondary | ICD-10-CM | POA: Insufficient documentation

## 2011-02-01 DIAGNOSIS — I10 Essential (primary) hypertension: Secondary | ICD-10-CM | POA: Insufficient documentation

## 2011-02-01 DIAGNOSIS — Z79899 Other long term (current) drug therapy: Secondary | ICD-10-CM | POA: Insufficient documentation

## 2011-02-01 DIAGNOSIS — R220 Localized swelling, mass and lump, head: Secondary | ICD-10-CM | POA: Insufficient documentation

## 2011-02-01 DIAGNOSIS — J45909 Unspecified asthma, uncomplicated: Secondary | ICD-10-CM | POA: Insufficient documentation

## 2011-02-01 DIAGNOSIS — K047 Periapical abscess without sinus: Secondary | ICD-10-CM | POA: Insufficient documentation

## 2011-02-01 DIAGNOSIS — Z9889 Other specified postprocedural states: Secondary | ICD-10-CM | POA: Insufficient documentation

## 2011-02-01 DIAGNOSIS — M129 Arthropathy, unspecified: Secondary | ICD-10-CM | POA: Insufficient documentation

## 2011-02-01 DIAGNOSIS — K089 Disorder of teeth and supporting structures, unspecified: Secondary | ICD-10-CM | POA: Insufficient documentation

## 2011-02-24 ENCOUNTER — Encounter: Payer: Self-pay | Admitting: Family Medicine

## 2011-02-24 ENCOUNTER — Other Ambulatory Visit (HOSPITAL_COMMUNITY)
Admission: RE | Admit: 2011-02-24 | Discharge: 2011-02-24 | Disposition: A | Discharge status: Home / Self Care | Payer: Medicaid Other | Source: Ambulatory Visit | Attending: Family Medicine | Admitting: Family Medicine

## 2011-02-24 ENCOUNTER — Ambulatory Visit (INDEPENDENT_AMBULATORY_CARE_PROVIDER_SITE_OTHER): Payer: Medicaid Other | Admitting: Family Medicine

## 2011-02-24 VITALS — BP 160/100 | HR 84 | Temp 98.6°F | Resp 20 | Ht 64.5 in | Wt 229.0 lb

## 2011-02-24 DIAGNOSIS — Z23 Encounter for immunization: Secondary | ICD-10-CM

## 2011-02-24 DIAGNOSIS — Z1211 Encounter for screening for malignant neoplasm of colon: Secondary | ICD-10-CM

## 2011-02-24 DIAGNOSIS — M549 Dorsalgia, unspecified: Secondary | ICD-10-CM

## 2011-02-24 DIAGNOSIS — I10 Essential (primary) hypertension: Secondary | ICD-10-CM

## 2011-02-24 DIAGNOSIS — M545 Low back pain, unspecified: Secondary | ICD-10-CM

## 2011-02-24 DIAGNOSIS — Z0000 Encounter for general adult medical examination without abnormal findings: Secondary | ICD-10-CM

## 2011-02-24 DIAGNOSIS — Z01419 Encounter for gynecological examination (general) (routine) without abnormal findings: Secondary | ICD-10-CM | POA: Insufficient documentation

## 2011-02-24 DIAGNOSIS — Z09 Encounter for follow-up examination after completed treatment for conditions other than malignant neoplasm: Secondary | ICD-10-CM

## 2011-02-24 DIAGNOSIS — J45909 Unspecified asthma, uncomplicated: Secondary | ICD-10-CM

## 2011-02-24 DIAGNOSIS — Z1231 Encounter for screening mammogram for malignant neoplasm of breast: Secondary | ICD-10-CM

## 2011-02-24 MED ORDER — METOPROLOL SUCCINATE ER 25 MG PO TB24: 25.0000 mg | ORAL_TABLET | Freq: Every day | ORAL | Status: DC

## 2011-02-24 MED ORDER — TETANUS-DIPHTH-ACELL PERTUSSIS 5-2.5-18.5 LF-MCG/0.5 IM SUSP
0.5000 mL | Freq: Once | INTRAMUSCULAR | Status: AC
  Administered 2011-02-24: 0.5 mL via INTRAMUSCULAR

## 2011-02-24 MED ORDER — ALBUTEROL SULFATE HFA 108 (90 BASE) MCG/ACT IN AERS: 2.0000 | INHALATION_SPRAY | Freq: Four times a day (QID) | RESPIRATORY_TRACT | Status: DC | PRN

## 2011-02-24 MED ORDER — TRIAMTERENE-HCTZ 37.5-25 MG PO TABS: 1.0000 | ORAL_TABLET | Freq: Every day | ORAL | Status: DC

## 2011-02-24 MED ORDER — TETANUS-DIPHTH-ACELL PERTUSSIS 5-2.5-18.5 LF-MCG/0.5 IM SUSP: 0.5000 mL | Freq: Once | INTRAMUSCULAR | Status: DC

## 2011-02-24 MED ORDER — BECLOMETHASONE DIPROPIONATE 40 MCG/ACT IN AERS: 2.0000 | INHALATION_SPRAY | Freq: Two times a day (BID) | RESPIRATORY_TRACT | Status: DC

## 2011-02-24 NOTE — Progress Notes (Signed)
  Subjective:     Heather Walker is a 50 y.o. female and is here for a comprehensive physical exam. The patient reports problems - back pain and wheezing.  no productive cough, no fevers.  She ran out of her inhaler..  History   Social History  . Marital Status: Married    Spouse Name: N/A    Number of Children: N/A  . Years of Education: ged   Occupational History  . disabled    Social History Main Topics  . Smoking status: Never Smoker   . Smokeless tobacco: Never Used  . Alcohol Use: No  . Drug Use: No  . Sexually Active: Yes -- Female partner(s)   Other Topics Concern  . Not on file   Social History Narrative  . No narrative on file   Health Maintenance  Topic Date Due  . Pap Smear  10/26/1980  . Tetanus/tdap  04/12/2016    The following portions of the patient's history were reviewed and updated as appropriate: allergies, current medications, past family history, past medical history, past social history, past surgical history and problem list.  Review of Systems Review of Systems  Constitutional: Negative for activity change, appetite change and fatigue.  HENT: Negative for hearing loss, congestion, tinnitus and ear discharge.  dentist q37m Eyes: Negative for visual disturbance (see optho --due). Respiratory: Negative for cough, chest tightness and shortness of breath.   Cardiovascular: Negative for chest pain, palpitations and leg swelling.  Gastrointestinal: Negative for abdominal pain, diarrhea, constipation and abdominal distention.  Genitourinary: Negative for urgency, frequency, decreased urine volume and difficulty urinating.  Musculoskeletal: Negative for back pain, arthralgias and gait problem.  Skin: Negative for color change, pallor and rash.  Neurological: Negative for dizziness, light-headedness, numbness and headaches.  Hematological: Negative for adenopathy. Does not bruise/bleed easily.  Psychiatric/Behavioral: Negative for suicidal ideas, confusion,  sleep disturbance, self-injury, dysphoric mood, decreased concentration and agitation.      Objective:    BP 160/100  Pulse 84  Temp(Src) 98.6 F (37 C) (Oral)  Resp 20  Ht 5' 4.5" (1.638 m)  Wt 229 lb (103.874 kg)  BMI 38.70 kg/m2  SpO2 94% General appearance: alert, cooperative, appears stated age and mild distress Head: Normocephalic, without obvious abnormality, atraumatic Eyes: conjunctivae/corneas clear. PERRL, EOM's intact. Fundi benign. Ears: normal TM's and external ear canals both ears Nose: Nares normal. Septum midline. Mucosa normal. No drainage or sinus tenderness. Throat: lips, mucosa, and tongue normal; teeth and gums normal Neck: no adenopathy, no carotid bruit, no JVD, supple, symmetrical, trachea midline and thyroid not enlarged, symmetric, no tenderness/mass/nodules Lungs: CTAB/L  -rrw Breasts: no nipple d/c, no dimpling, no masses palpated, no axillary nodes Heart: +S1S2  No murmur Abdomen: soft, NT  + BS,  No masses palpated Pelvic: no ext lesions, no d/c no cervical lesions or d/c,  BME no masses palpated, no adenexal tendernss, masses palpated Extremities: - CCE Pulses: good pedal pulses and post tib pulses,  Carotid normal b/l Skin: no rashes, lesions Lymph nodes: no abn lymph nodes --cervical or axillary Neurologic: grossly normal  Assessment:    Healthy female exam.      Plan:     [facility] utd SBE Check mammo  See After Visit Summary for Counseling Recommendations

## 2011-02-24 NOTE — Patient Instructions (Signed)

## 2011-02-24 NOTE — Assessment & Plan Note (Addendum)
Pt was out of meds for a few weeks D/w not running out of meds meds refilled  Recheck 2 weeks

## 2011-02-24 NOTE — Assessment & Plan Note (Signed)
Not a surgical candidate per ortho/ NS Refer to Dr Ethelene Hal to help with pain management

## 2011-03-01 NOTE — H&P (Signed)
Heather Walker, Heather Walker                ACCOUNT NO.:  [id number]   MEDICAL RECORD NO.:  [id number]          PATIENT TYPE:  EMS   LOCATION:  [facility]                         FACILITY:  [facility]   PHYSICIAN:  Sandria Bales. Ezzard Standing, M.D.  DATE OF BIRTH:  [birth year]   DATE OF ADMISSION:  07/30/2007  DATE OF DISCHARGE:                              HISTORY & PHYSICAL   HISTORY OF ILLNESS:  This is a 50 year old black female who is a patient  of Dr. Loreen Freud, who woke up this morning about 2 a.m. with right  upper quadrant pain, had nausea and has had vomiting.   She had some collards and chicken last night, but she has never had any  prior GI symptoms like this.  She did have a laparoscopic appendectomy  in January 2005 by Dr. Cicero Duck at [facility], which was  apparently uneventful.  She denies a history of peptic ulcer disease,  liver disease, pancreatic disease or colon disease.   ALLERGIES:  She is allergic to CODEINE, which makes her itch.   CURRENT MEDICATIONS:  For hypertension and include:  1. Maxzide.  2. Metoprolol 50 mg daily.  3. Medicine unknown.   REVIEW OF SYSTEMS:  NEUROLOGIC:  No history of seizures or loss of  consciousness.  PULMONARY:  No history of pneumonia or tuberculosis.  CARDIAC:  No history of heart disease, chest pain.  She does have the  hypertension, for about 5 to 6 years.  She has never had a cardiac  catheterization.  GASTROINTESTINAL:  See history of present illness.  UROLOGIC:  No kidney stones or kidney infection.  GYN:  She has had 3 children, whose ages are 53, 8, and 85.  BREASTS:  She has had mammograms.  They want a repeat mammogram of her  right breast.  She does not know why.  That has not been done.  She has  had no prior history of breast disease or breast biopsies.   PERSONAL HISTORY:  She works as an Government social research officer for [employer].  Her husband works as a Estate agent,  but he is at work right now.   PHYSICAL EXAMINATION:  VITAL SIGNS:  Her temperature is 97.1, blood  pressure 125/74, pulse 62, respirations 18.  GENERAL:  She is a morbidly obese black female, alert and cooperative.  HEENT:  Unremarkable.  NECK:  Supple.  I feel no mass, no thyromegaly.  LYMPH NODES:  She has no cervical or axillary adenopathy.  LUNGS:  Clear to auscultation with symmetric breath sounds.  HEART:  Has a regular rate and rhythm.  I hear no murmur or rub.  BREASTS:  I did not examine her breasts today.  She is aware she needs  to follow up on her mammograms.  ABDOMEN:  She has some vague discomfort/tenderness in her epigastrium  and right upper quadrant.  She has no guarding.  No rebound, but she is  obese which really limits what I can feel from a physical exam.  EXTREMITIES:  She has good strength in all 4 extremities.  NEUROLOGIC:  Grossly intact.   LABS:  Show a sodium of 140, potassium 3.1, chloride 104, BUN of 6,  glucose of 143.  Her hemoglobin is 11, hematocrit 33, white blood count  of 4900.  Her alk phos is 72, her total bilirubin is 0.4, her lipase is  34.   An ultrasound performed shows a gallbladder visualized in the neck of  the gallbladder with chronic extrahepatic duct dilatation.   IMPRESSION:  1. Probably early cholecystitis with evidence of gallstone impacted in      neck of gallbladder.  I discussed with the patient about proceeding      with gallbladder surgery today.  I discussed with her the risks and      benefit of gallbladder surgery.  The risks include, but are not      limited to bleeding, infection, bile duct injury and the      possibility of open surgery, particularly with her obesity.  I      would estimate the chance of open surgery about 10%.  I think she      understands all this and wants to go to surgery today.  2. Hypertension.  3. Morbid obesity.  She is aware she needs to lose weight.  4. Followup mammograms for her right breast.      Sandria Bales. Ezzard Standing,  M.D.  Electronically Signed     DHN/MEDQ  D:  07/30/2007  T:  07/30/2007  Job:  [id number]   cc:   Lelon Perla, DO

## 2011-03-01 NOTE — Progress Notes (Signed)
Were all the tests listed in overdue labs drawn?

## 2011-03-01 NOTE — Op Note (Signed)
[name], Walker                ACCOUNT NO.:  [id number]   MEDICAL RECORD NO.:  [id number]          PATIENT TYPE:  INP   LOCATION:  2550                         FACILITY:  [facility]   PHYSICIAN:  Sandria Bales. Ezzard Standing, M.D.  DATE OF BIRTH:  14-Apr-1961   DATE OF PROCEDURE:  07/30/2007  DATE OF DISCHARGE:                               OPERATIVE REPORT   PREOPERATIVE DIAGNOSIS:  Acute cholecystitis, cholelithiasis.   POSTOPERATIVE DIAGNOSIS:  Acute edematous cholecystitis with  cholelithiasis.   PROCEDURE:  Laparoscopic cholecystectomy with intraoperative  cholangiogram.   SURGEON:  Sandria Bales. Ezzard Standing, M.D.   FIRST ASSISTANT:  Velora Heckler, MD.   ANESTHESIA:  General endotracheal anesthesia.   ESTIMATED BLOOD LOSS:  Minimal.   INDICATIONS FOR PROCEDURE:  [name] is a 50 year old black female who  is a patient of Lelon Perla, DO, who presented with acute onset of  right upper quadrant abdominal pain, nausea and vomiting to the [place]  [facility] Emergency Room.  An ultrasound suggests she has  an impacted gallstone in the neck of her gallbladder.   I discussed with the patient about proceeding with gallbladder surgery  today.  I discussed the indications and potential complications.  Potential complications include, but are not limited to bleeding,  infection, bile duct injury and the possibility of open surgery.   OPERATIVE NOTE:  Patient placed in the supine position, given a general  anesthetic.  Her abdomen was prepped with a solution and sterilely  draped.  She was given 1 gram of Ancef at the initiation of the  procedure.  She had PAS stockings in place and a time-out was held,  identifying the patient and procedure.   I accessed her abdominal cavity making an infraumbilical incision with  sharp dissection carried down to the abdominal cavity.  A 0 degree 10 mm  laparoscope was inserted through a 12 mm trocar.  Trocar secured with 0  Vicryl suture.  I then  placed four additional trocars, a 10 mm  subxiphoid [employer] trocar, a 5 mm mid upper abdominal trocar to  kind of hold the fat out of the way, a 5 mm right subcostal and a 5 mm  lateral subcostal trocar.   I identified both lobes of the liver.  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  Her stomach that I could see was unremarkable.  Her colon  that  I could see was unremarkable.  However, much of her abdominal  bowel was covered with fat.   She did have edematous changes with a fairly tensely distended  gallbladder.  The first thing I did was decompressed the gallbladder.   I then grabbed the gallbladder, retracted it cephalad and then went down  and dissected out the cystic duct gallbladder junction.  I identified  the triangle of Calot and I placed a clip on the gallbladder side of the  cystic duct.   After the cystic duct had been clipped, I then shot an intraoperative  cholangiogram.  I  used a 14-gauge Jelco catheter to the surface cutoff  taut.  The abdominal cavity taut catheter was inserted into the side of  the cystic duct and secured with an Endoclip.   I shot the intraoperative cholangiogram under fluoroscopy using half-  strength Hypaque solution, I injected about 7-8 mL of contrast that  showed free flow of a very short cystic duct into went actually looked  like maybe at the junction of the right hepatic common bile duct but  there was free flow of contrast down the common bile duct into the  duodenum, there was obstruction, no mass.   I then removed the taut catheter, I triply Endoclipped the cystic duct.  I then sharply dissected from the triangle of Calot.  I identified the  cystic artery which I doubly Endoclipped and divided.  I think I found a  posterior branch of the cystic artery too which I doubly Endoclipped and  divided.  I then sharply and bluntly dissected the gallbladder from  the  gallbladder bed.  Prior  to complete division of the gallbladder from  the gallbladder bed, I revisualized the gallbladder bed, saw no  bleeding, no bile leak.  I then removed the gallbladder, placed it in  the EndoCatch bag and delivered it through the umbilicus.  Of note, she  did have some omentum around the umbilicus, but I saw no bowel there.  I  then irrigated the abdomen with about 500 mL of saline.  I then removed  each trocar site in turn.   I closed the umbilical port with a 0 Vicryl suture.  I infiltrated with  about 20 mL of 25% Marcaine, then closed the skin with a 5-0 Vicryl  suture, painted the wound with tincture of Benzoin and Steri-Strips.   The patient tolerated the procedure well, was transported to the  recovery room in good condition.      Sandria Bales. Ezzard Standing, M.D.  Electronically Signed     DHN/MEDQ  D:  07/30/2007  T:  07/31/2007  Job:  [id number]   cc:   Lelon Perla, DO

## 2011-03-01 NOTE — Progress Notes (Signed)
Addended by: Almeta Monas on: 03/01/2011 12:04 PM   Modules accepted: Orders

## 2011-03-01 NOTE — Op Note (Signed)
Heather Walker, Heather Walker                ACCOUNT NO.:  [id number]   MEDICAL RECORD NO.:  [id number]          PATIENT TYPE:  INP   LOCATION:  9316                          FACILITY:  [facility]   PHYSICIAN:  Michelle L. Grewal, M.D.DATE OF BIRTH:  [birth year]   DATE OF PROCEDURE:  12/06/2007  DATE OF DISCHARGE:                               OPERATIVE REPORT   PREOPERATIVE DIAGNOSES:  1. Fibroids.  2. Menorrhagia.   POSTOPERATIVE DIAGNOSES:  1. Fibroids.  2. Menorrhagia.   PROCEDURE:  Total abdominal hysterectomy.   SURGEON:  Michelle L. Vincente Poli, M.D.   ASSISTANTFreddy Finner, M.D.   ANESTHESIA:  General.   FINDINGS:  16 weeks' size fibroids.   SPECIMENS:  Fibroids.   ESTIMATED BLOOD LOSS:  200 mL.   DRAINS:  Foley.   COMPLICATIONS:  None.   PROCEDURE:  The patient was taken to the operating room.  She was  intubated without difficulty.  She was then prepped and draped in the  usual sterile fashion.  A Foley catheter was inserted.   A low transverse incision was made at the area of the previous C-section  scars.  It was carried down to the fascia.  The fascia was scored in the  midline and extended laterally.  The rectus muscles were separated in  the midline.  The peritoneum was entered bluntly.  The peritoneal  incision was then stretched.  The uterus was up to the level of the  incision, and it was enlarged and consistent with the preoperative  ultrasound.  We then grasped the uterus and exteriorized it as much as  we could, and that was done quite easily.  The ovaries appeared normal.  We first then proceeded with the hysterectomy.  We will felt that it was  easier to do without a retractor because we could visualize the pedicles  easily.  A curved Heaney clamp was placed across each triple pedicle on  either side.  The triple pedicles were then clamped using curved Heaney  clamps.  Each pedicle was suture ligated using 0 Vicryl suture and  further secured using a free tie  of 0 Vicryl suture.  The bladder flap  was created sharply without any difficulty using Metzenbaum scissors.  The uterine arteries were skeletonized, and the uterine artery was  clamped initially on the left using a curved Heaney clamps at the level  of the internal os and suture ligated using 0 Vicryl suture, and it was  done on the right side in a similar fashion.  The bladder flap was then  created further, and using a series of straight Heaney clamps, we  clamped just beside the cervix.  Each pedicle was suture ligated using 0  Vicryl suture.  We then placed curved Heaney clamps just beneath the  external os, and the specimen was removed using the Metzenbaum scissors.  The pedicles were secured using 0 Vicryl suture.  The vaginal cuff was  closed using a series of figure-of-eights using 0 Vicryl suture.  Hemostasis was excellent.  Warm irrigation was then performed, and  everything was inspected and  there was no bleeding noted.  The  peritoneum was closed using 0 Vicryl in a running stitch.  The rectus  muscles were reapproximated using the same 0 Vicryl, and the fascia was  closed using 0 Vicryl in a running stitch starting at each corner and  meeting in the midline.  After irrigation of the subcutaneous layer and  noting hemostasis, the skin was closed with staples.  All sponge, lap,  and instrument counts were correct x2.   The patient was extubated and went to recovery room in stable condition.      Michelle L. Vincente Poli, M.D.  Electronically Signed     MLG/MEDQ  D:  12/06/2007  T:  12/06/2007  Job:  [id number]

## 2011-03-02 ENCOUNTER — Encounter: Payer: Self-pay | Admitting: *Deleted

## 2011-03-02 ENCOUNTER — Telehealth: Payer: Self-pay | Admitting: *Deleted

## 2011-03-02 ENCOUNTER — Encounter: Payer: Self-pay | Admitting: Family Medicine

## 2011-03-02 NOTE — Telephone Encounter (Signed)
Discuss with patient, will try again and if not successful will bring kit back to office.

## 2011-03-02 NOTE — Telephone Encounter (Signed)
Pt states that she was given IFOB to complete however due to her weak stomach she is unable to do this. Pt notes that  letter she was given advise her that there would be a $5.00 charge if test was not return. Please advise

## 2011-03-02 NOTE — Telephone Encounter (Signed)
It is a screening for colon cancer---she really needs to do it ----I guess she can bring it back if she refuses.

## 2011-03-04 ENCOUNTER — Encounter: Payer: Self-pay | Admitting: Family Medicine

## 2011-03-04 ENCOUNTER — Ambulatory Visit (INDEPENDENT_AMBULATORY_CARE_PROVIDER_SITE_OTHER): Payer: Medicaid Other | Admitting: Family Medicine

## 2011-03-04 DIAGNOSIS — M19012 Primary osteoarthritis, left shoulder: Secondary | ICD-10-CM | POA: Insufficient documentation

## 2011-03-04 DIAGNOSIS — M1990 Unspecified osteoarthritis, unspecified site: Secondary | ICD-10-CM

## 2011-03-04 DIAGNOSIS — M549 Dorsalgia, unspecified: Secondary | ICD-10-CM

## 2011-03-04 NOTE — Op Note (Signed)
NAME:  GWYNNE, KEMNITZ                          ACCOUNT NO.:  [id number]   MEDICAL RECORD NO.:  [id number]                   PATIENT TYPE:  INP   LOCATION:  0101                                 FACILITY:  [facility]   PHYSICIAN:  Currie Paris, M.D.           DATE OF BIRTH:  05/30/1961   DATE OF PROCEDURE:  10/25/2003  DATE OF DISCHARGE:                                 OPERATIVE REPORT   PREOPERATIVE DIAGNOSIS:  Early acute appendicitis.   POSTOPERATIVE DIAGNOSIS:  Right lower quadrant pain secondary to ruptured  corpus lutein cyst.   OPERATION:  Laparoscopy with appendectomy.   SURGEON:  Currie Paris, M.D.   ANESTHESIA:  General.   CLINICAL HISTORY:  This patient is a 50 year old gentleman, who presented  with abdominal pain which began mid lower abdomen.  It localized into the  right lower quadrant with some direct right lower quadrant tenderness and a  little bit of rebound.  A CT scan had shown somewhat of dilated appendix  with a little bit of periappendiceal inflammatory changes which had actually  changed from CT done two years ago and was therefore thought acute.  However, she had normal white count and no fever.  After discussion with the  patient, we elected to proceed to laparoscopy with laparoscopic  appendectomy.   DESCRIPTION OF PROCEDURE:  The patient was seen in the holding area and had  no further questions.  She was taken to the operating room and after  satisfactory general endotracheal anesthesia had been obtained, the abdomen  was prepped and draped.  Marcaine 0.25% was used for each incision.  The  umbilical incision was made, the fascia opened, and the peritoneal cavity  entered under direct vision.  A pursestring was placed, the Hasson  introduced, and the abdomen insufflated to 15.   Under direct vision, a 5 mm trocar was placed in the right upper abdomen and  a 10-11 in the left lower and a camera placed in the left lower.  Initially,  we saw  a large, cystic lesion on the right ovary.  Photographs were taken.  I thought this was most likely benign, possibly a corpus lutein cyst but  requested GYN to come in and look at this.   Meanwhile, we identified the appendix which basically looked normal.  The  mesoappendix was then divided with harmonic scalpel down to the base.  The  appendix was amputated with the EndoGIA blue stapler.  It was placed in a  bag and brought out the umbilical port.  The abdomen was reinsufflated.  We  suctioned some serious fluid out of the pelvis and looked at the right ovary  again which appeared to have a cystic lesion about 3 cm and what appeared to  be the recent rupture from the corpus lutein.  The uterus had some fibroids.  The left ovary looked normal.  There appeared to have been a prior  tubal  ligation.   Dr. Tamela Oddi came in and looked and agreed that this was all benign and  needed no further treatment.   Therefore, the trocar was removed under direct vision with the abdomen being  desufflated through the umbilical port.  The pursestring suture was tied  down to close that.  Skin was closed with 4-0 Monocryl subcuticular and  Dermabond.   The patient tolerated the procedure well, and there were no operative  complications.  All counts were correct.                                               Currie Paris, M.D.    CJS/MEDQ  D:  10/25/2003  T:  10/25/2003  Job:  [id number]

## 2011-03-04 NOTE — Discharge Summary (Signed)
Heather Walker, Heather Walker                ACCOUNT NO.:  [id number]   MEDICAL RECORD NO.:  [id number]          PATIENT TYPE:  INP   LOCATION:  9316                          FACILITY:  [facility]   PHYSICIAN:  Michelle L. Grewal, M.D.DATE OF BIRTH:  22-Aug-1961   DATE OF ADMISSION:  12/06/2007  DATE OF DISCHARGE:  12/09/2007                               DISCHARGE SUMMARY   PREOPERATIVE DIAGNOSIS:  Symptomatic fibroids.   DISCHARGE DIAGNOSIS:  Symptomatic fibroids, status post total abdominal  hysterectomy.   HOSPITAL COURSE:  The patient is a 51 year old G3, P3 with severe  dysmenorrhea and menorrhagia.  She has a 14-week size uterus with  fibroids.  The patient is admitted on the day of admission and undergoes  a TAH.  She did very well with the surgery.  EBL was 200 mL.  The  patient did very well postop.  By postop day #1, she was tolerating  regular diet, ambulating, and voiding without difficulty.  Postop day #1  hemoglobin was 9.8, which is appropriate for EBL in the operating room.  On postop day #3, she was ready to go home.  She was ambulating.  She  remained afebrile with stable vital signs.  She had good pain control  with ibuprofen and Demerol.  She will follow up on Wednesday to remove  her staples.  She was advised no driving for 2 weeks, no sex for 6  weeks, no heavy lifting for 6 weeks.  She was advised to call if she has  temperature greater than 100.5, nausea, vomiting, abdominal pain,  redness or drainage from incision site.  She will follow up with me for  staple removal.      Michelle L. Vincente Poli, M.D.  Electronically Signed     MLG/MEDQ  D:  01/10/2008  T:  01/11/2008  Job:  [id number]

## 2011-03-04 NOTE — Progress Notes (Signed)
  Subjective:    Patient ID: Heather Walker, female    DOB: [birth year], 50 y.o.   MRN: [id number]  HPI Pt here for a mobility evaluation.  Pt uses walker to get around but it is not functional or safe and  can not cook anymore because she can not stand long enough. Pt has asthma, HTN,  Severe arthritis in knees, low back and shoulders.  Pt is at high risk for falls and stumbles into furniture and walls daily--It is not a functional , safe means to get around.  Pt has difficulties safely moving room to room and has toilet accidents due to not being able to get to the BR in time.  Pt can not functionally use walker or cane due to unsteady gait and high risk to falls and low ext weakness and pain due to arthritis.  Pt would not be able to functionally propel manual chair in home due to upper ext weakness  and joint pain.  Pt at rest is a 5/10 for pain and during strength test exhibited 8/10 for pain.  A scooter would not be appropriate due to inadequate access for manuevering it in home.  Pt has the physical and mental ability and is willing and motivated to use the power chair in the home.  Pt needs a power wheelchair in order to safely complete all ADLs in home.      Review of Systems   as above  Objective:   Physical Exam  Constitutional: She is oriented to person, place, and time. She appears well-developed.  Musculoskeletal:       Right shoulder: She exhibits no deformity.       Right hip: She exhibits decreased range of motion, decreased strength and tenderness.  Neurological: She is alert and oriented to person, place, and time. No sensory deficit.       3/5 shoulders in both shoulders Grip 5/5 R hip 3/5  L 3/5 Int rotation--5/5 Ext rot  4/5  Psychiatric: She has a normal mood and affect. Her behavior is normal.          Assessment & Plan:

## 2011-03-04 NOTE — Assessment & Plan Note (Signed)
rx and letter for power chair done

## 2011-03-04 NOTE — H&P (Signed)
NAME:  Heather Walker, Heather Walker                          ACCOUNT NO.:  [id number]   MEDICAL RECORD NO.:  [id number]                   PATIENT TYPE:  EMS   LOCATION:  ED                                   [facility]ACILITY:  [facility]   PHYSICIAN:  Currie Paris, M.D.           DATE OF BIRTH:  1960-12-13   DATE OF ADMISSION:  10/25/2003  DATE OF DISCHARGE:                                HISTORY & PHYSICAL   CHIEF COMPLAINT:  Abdominal pain.   HISTORY OF PRESENT ILLNESS:  Heather Walker is a 50 year old lady who presents  to the emergency room this morning with abdominal pain which began about 24  hours earlier.  It was initially fairly diffuse across her abdomen,  primarily the lower abdomen but over time and especially today, it has  become more marked in the right lower quadrant.  She has had some nausea but  no vomiting.  The pain has been fairly severe up to a scale of 8 out of 10.  It somewhat cramps.  She has not had vomiting.  She has had normal bowel  movements with no diarrhea.  She does not recall having any pain similar to  this before.  She can not seem to get any relief by anything that she does.  Sometimes taking deep breaths makes a little bit worse.  She points to about  the area of McBurney's point as the maximum point of pain area.   PAST MEDICAL HISTORY:   OPERATIONS:  She has had C-sections x 2.   MEDICATIONS:  She takes:  1. Maxzide for hypertension.  2. __________ for asthma.  3. Occasional Flexeril.   SOCIAL HISTORY:  She does not smoke nor drink.  She has children.   FAMILY HISTORY:  Negative for gallstones, abdominal problems, cardiac  disease or diabetes.   REVIEW OF SYSTEMS:  HEENT:  Negative.  CHEST:  Does have a history of  asthma, asymptomatic currently.  HEART:  History of hypertension.  No  history of angina, chest pains, coronary artery disease.  ABDOMEN:  Negative  except for HPI.  GU:  Negative.  EXTREMITIES:  Negative.   PHYSICAL EXAMINATION:  GENERAL:  The  patient is a somewhat overweight,  alert, oriented female who appears uncomfortable but not toxic or in acute  distress.  VITAL SIGNS:  Include a temp of 97.4 repeat 97.5 with a pulse of 96.  Blood  pressure initially was 167/93, followup is 116/81.  HEENT:  Head is normocephalic.  Eyes:  EOMs are intact.  PERRL.  Nonicteric.  Pharynx normal.  Not dry mucous membranes.  NECK:  Supple.  There are no masses and no thyromegaly.  LUNGS:  Normal respirations.  They are clear to auscultation.  HEART:  I hear a regular rhythm.  I hear no murmurs, rubs or gallops.  She  has intact pulses at carotid, dorsalis pedis.  ABDOMEN:  Well healed surgical scars lower  abdomen for C-section.  The  abdomen has distinct right lower quadrant tenderness with a little bit of  guarding and a little bit of rebound, but these are minimal findings.  The  rest of the abdomen is soft and fairly nontender.  Bowel sounds are present.  PELVIC:  Not done.  RECTAL:  Not done.  EXTREMITIES:  Good range of motion.  No edema noted.   LABORATORY STUDIES:  She has a negative urinalysis.  White count is actually  only 4500.  Electrolytes are pending.   CT scan is reviewed with the radiologist.  It appears she has a somewhat  enlarged appendix, although it is air filled and not fluid filled.  There is  a little stranding adjacent to suggest an early acute appendicitis and this  stranding in fact was not seen on a prior CT scan that we happened to have  available for review.  There is also a question of fecalith in the appendix.   IMPRESSION:  I think she has an early acute appendicitis based on her  history, physical and CT findings, although this could be just some viral  illness given her normal white count and lack of fever.   RECOMMENDATIONS:  I think given our suspicions based on her history and  physical, and CT, that we ought to proceed to laparoscopy with laparoscopic  appendectomy.  I have discussed that with the  patient and her husband, and  they both agree to proceed.  I think all questions have been answered.  We  talked about risks, complications, etcetera.                                               Currie Paris, M.D.    CJS/MEDQ  D:  10/25/2003  T:  10/25/2003  Job:  [id number]

## 2011-03-22 ENCOUNTER — Emergency Department (HOSPITAL_COMMUNITY)
Admission: EM | Admit: 2011-03-22 | Discharge: 2011-03-22 | Disposition: A | Discharge status: Home / Self Care | Payer: Medicaid Other | Attending: Emergency Medicine | Admitting: Emergency Medicine

## 2011-03-22 DIAGNOSIS — M545 Low back pain, unspecified: Secondary | ICD-10-CM | POA: Insufficient documentation

## 2011-03-22 DIAGNOSIS — R0682 Tachypnea, not elsewhere classified: Secondary | ICD-10-CM | POA: Insufficient documentation

## 2011-03-22 DIAGNOSIS — I10 Essential (primary) hypertension: Secondary | ICD-10-CM | POA: Insufficient documentation

## 2011-04-07 ENCOUNTER — Other Ambulatory Visit: Payer: Self-pay | Admitting: *Deleted

## 2011-04-07 MED ORDER — CYCLOBENZAPRINE HCL 5 MG PO TABS: 5.0000 mg | ORAL_TABLET | Freq: Three times a day (TID) | ORAL | Status: DC | PRN

## 2011-04-07 NOTE — Telephone Encounter (Signed)
Last seen 03/04/11 and filled 03/22/11 at the hospital for back pain--still having pain. Patient requested Demerol to replace the Oxycodone because it worked better for her. She said if you can not prescribe it for her she understands and she will just get the Oxycodone. Please advise     KP

## 2011-04-07 NOTE — Telephone Encounter (Signed)
She was supposed to see ortho--- they should be doing pain meds

## 2011-04-07 NOTE — Telephone Encounter (Signed)
Ok to fell flexeril but ortho should be doing pain meds

## 2011-04-08 MED ORDER — HYDROCODONE-ACETAMINOPHEN 7.5-500 MG PO TABS: 1.0000 | ORAL_TABLET | Freq: Four times a day (QID) | ORAL | Status: DC | PRN

## 2011-04-08 NOTE — Telephone Encounter (Signed)
Pt notes that she was unable to see ortho because when she went to ortho appt her medicaid had another physician listed on it so they would not see her. Pt indicated that she has been trying since then to get in touch with social services to have name change on card but she has been unsuccessful so far. Pt states that she will still continue to try to contact ortho as well to get rescheduled.  Pt states that she finally received her power chair a week ago and would like to thank you for all you did to get it for her.

## 2011-04-08 NOTE — Telephone Encounter (Signed)
Addended by: Candie Echevaria L on: 04/08/2011 12:30 PM   Modules accepted: Orders

## 2011-04-08 NOTE — Telephone Encounter (Signed)
Addended by: Arnette Norris on: 04/08/2011 01:09 PM   Modules accepted: Orders

## 2011-04-08 NOTE — Telephone Encounter (Signed)
Refill x1 

## 2011-04-08 NOTE — Telephone Encounter (Signed)
Faxed.   KP 

## 2011-04-12 ENCOUNTER — Other Ambulatory Visit: Payer: Medicaid Other

## 2011-04-13 ENCOUNTER — Telehealth: Payer: Self-pay | Admitting: *Deleted

## 2011-04-13 NOTE — Telephone Encounter (Signed)
Pt states that she is unable to complete kit and will pay the fee.

## 2011-04-13 NOTE — Telephone Encounter (Signed)
Please call pt

## 2011-04-13 NOTE — Telephone Encounter (Signed)
mssg left to call the office    KP 

## 2011-04-13 NOTE — Telephone Encounter (Signed)
Heather Walker with [facility] called with FYI to notify that the pt will be charged for i fob kit. I Fob was never turned in.

## 2011-07-08 LAB — CBC
HCT: 28.4 — ABNORMAL LOW
HCT: 32.7 — ABNORMAL LOW
Hemoglobin: 11.1 — ABNORMAL LOW
Hemoglobin: 9.8 — ABNORMAL LOW
MCHC: 34
MCHC: 34.5
MCV: 79.9
MCV: 80.5
Platelets: 271
Platelets: 301
RBC: 3.52 — ABNORMAL LOW
RBC: 4.09
RDW: 15.9 — ABNORMAL HIGH
RDW: 16.2 — ABNORMAL HIGH
WBC: 11.2 — ABNORMAL HIGH
WBC: 4.6

## 2011-07-08 LAB — PREGNANCY, URINE: Preg Test, Ur: NEGATIVE

## 2011-07-08 LAB — BASIC METABOLIC PANEL
BUN: 6
CO2: 26
Calcium: 8.4
Chloride: 102
Creatinine, Ser: 0.57
GFR calc Af Amer: >60
GFR calc non Af Amer: >60
Glucose, Bld: 110 — ABNORMAL HIGH
Potassium: 3.3 — ABNORMAL LOW
Sodium: 137

## 2011-07-26 LAB — COMPREHENSIVE METABOLIC PANEL
ALT: 12
AST: 15
Albumin: 3.1 — ABNORMAL LOW
Alkaline Phosphatase: 68
BUN: 3 — ABNORMAL LOW
CO2: 25
Calcium: 7.8 — ABNORMAL LOW
Chloride: 107
Creatinine, Ser: 0.51
GFR calc Af Amer: >60
GFR calc non Af Amer: >60
Glucose, Bld: 133 — ABNORMAL HIGH
Potassium: 2.9 — ABNORMAL LOW
Sodium: 138
Total Bilirubin: 0.7
Total Protein: 6.5

## 2011-07-26 LAB — DIFFERENTIAL
Basophils Absolute: 0
Basophils Relative: 0
Eosinophils Absolute: 0.1 — ABNORMAL LOW
Eosinophils Relative: 1
Lymphocytes Relative: 26
Lymphs Abs: 2.1
Monocytes Absolute: 0.7
Monocytes Relative: 9
Neutro Abs: 5
Neutrophils Relative %: 64

## 2011-07-26 LAB — URINALYSIS, ROUTINE W REFLEX MICROSCOPIC
Bilirubin Urine: NEGATIVE
Glucose, UA: NEGATIVE
Hgb urine dipstick: NEGATIVE
Ketones, ur: NEGATIVE
Nitrite: POSITIVE — AB
Protein, ur: NEGATIVE
Specific Gravity, Urine: 1.013
Urobilinogen, UA: 0.2
pH: 7.5

## 2011-07-26 LAB — CBC
HCT: 31.4 — ABNORMAL LOW
Hemoglobin: 10.5 — ABNORMAL LOW
MCHC: 33.5
MCV: 83.4
Platelets: 255
RBC: 3.76 — ABNORMAL LOW
RDW: 17.2 — ABNORMAL HIGH
WBC: 7.8

## 2011-07-26 LAB — URINE MICROSCOPIC-ADD ON

## 2011-07-28 LAB — DIFFERENTIAL
Basophils Absolute: 0
Basophils Relative: 0
Eosinophils Absolute: 0.1
Eosinophils Relative: 2
Lymphocytes Relative: 43
Lymphs Abs: 2.1
Monocytes Absolute: 0.6
Monocytes Relative: 11
Neutro Abs: 2.1
Neutrophils Relative %: 44

## 2011-07-28 LAB — CBC
HCT: 33.4 — ABNORMAL LOW
Hemoglobin: 11.2 — ABNORMAL LOW
MCHC: 33.5
MCV: 82.8
Platelets: 315
RBC: 4.04
RDW: 16.2 — ABNORMAL HIGH
WBC: 4.9

## 2011-07-28 LAB — I-STAT 8, (EC8 V) (CONVERTED LAB)
Acid-Base Excess: 1
BUN: 6
Bicarbonate: 26 — ABNORMAL HIGH
Chloride: 104
Glucose, Bld: 143 — ABNORMAL HIGH
HCT: 38
Hemoglobin: 12.9
Operator id: 294511
Potassium: 3.1 — ABNORMAL LOW
Sodium: 140
TCO2: 27
pCO2, Ven: 40.6 — ABNORMAL LOW
pH, Ven: 7.416 — ABNORMAL HIGH

## 2011-07-28 LAB — HEPATIC FUNCTION PANEL
ALT: 12
AST: 18
Albumin: 3.3 — ABNORMAL LOW
Alkaline Phosphatase: 72
Bilirubin, Direct: <0.1
Total Bilirubin: 0.4
Total Protein: 6.9

## 2011-07-28 LAB — URINALYSIS, ROUTINE W REFLEX MICROSCOPIC
Bilirubin Urine: NEGATIVE
Glucose, UA: NEGATIVE
Hgb urine dipstick: NEGATIVE
Ketones, ur: NEGATIVE
Nitrite: NEGATIVE
Protein, ur: NEGATIVE
Specific Gravity, Urine: 1.011
Urobilinogen, UA: 0.2
pH: 7.5

## 2011-07-28 LAB — POCT I-STAT CREATININE
Creatinine, Ser: 0.6
Operator id: 294511

## 2011-07-28 LAB — LIPASE, BLOOD: Lipase: 34

## 2011-10-24 ENCOUNTER — Telehealth: Payer: Self-pay | Admitting: Family Medicine

## 2011-10-25 ENCOUNTER — Telehealth: Payer: Self-pay | Admitting: Family Medicine

## 2011-10-25 ENCOUNTER — Ambulatory Visit (INDEPENDENT_AMBULATORY_CARE_PROVIDER_SITE_OTHER): Payer: Self-pay | Admitting: Family Medicine

## 2011-10-25 ENCOUNTER — Encounter: Payer: Self-pay | Admitting: Family Medicine

## 2011-10-25 DIAGNOSIS — R079 Chest pain, unspecified: Secondary | ICD-10-CM

## 2011-10-25 DIAGNOSIS — R05 Cough: Secondary | ICD-10-CM

## 2011-10-25 DIAGNOSIS — R059 Cough, unspecified: Secondary | ICD-10-CM

## 2011-10-25 DIAGNOSIS — I10 Essential (primary) hypertension: Secondary | ICD-10-CM

## 2011-10-25 DIAGNOSIS — R32 Unspecified urinary incontinence: Secondary | ICD-10-CM

## 2011-10-25 LAB — CARDIAC PANEL
CK-MB: 0.6 ng/mL (ref 0.3–4.0)
Relative Index: 1.1 calc (ref 0.0–2.5)
Total CK: 55 U/L (ref 7–177)

## 2011-10-25 LAB — CBC WITH DIFFERENTIAL/PLATELET
Basophils Absolute: 0 10*3/uL (ref 0.0–0.1)
Basophils Relative: 0.4 % (ref 0.0–3.0)
Eosinophils Absolute: 0.1 10*3/uL (ref 0.0–0.7)
Eosinophils Relative: 2.6 % (ref 0.0–5.0)
HCT: 37.6 % (ref 36.0–46.0)
Hemoglobin: 12.7 g/dL (ref 12.0–15.0)
Lymphocytes Relative: 54 % — ABNORMAL HIGH (ref 12.0–46.0)
Lymphs Abs: 1.9 10*3/uL (ref 0.7–4.0)
MCHC: 33.8 g/dL (ref 30.0–36.0)
MCV: 86.4 fl (ref 78.0–100.0)
Monocytes Absolute: 0.3 10*3/uL (ref 0.1–1.0)
Monocytes Relative: 9.2 % (ref 3.0–12.0)
Neutro Abs: 1.2 10*3/uL — ABNORMAL LOW (ref 1.4–7.7)
Neutrophils Relative %: 33.8 % — ABNORMAL LOW (ref 43.0–77.0)
Platelets: 248 10*3/uL (ref 150.0–400.0)
RBC: 4.35 Mil/uL (ref 3.87–5.11)
RDW: 15.4 % — ABNORMAL HIGH (ref 11.5–14.6)
WBC: 3.6 10*3/uL — ABNORMAL LOW (ref 4.5–10.5)

## 2011-10-25 LAB — LIPID PANEL
Cholesterol: 168 mg/dL (ref 0–200)
HDL: 52.6 mg/dL (ref >39.00)
LDL Cholesterol: 99 mg/dL (ref 0–99)
Total CHOL/HDL Ratio: 3
Triglycerides: 81 mg/dL (ref 0.0–149.0)
VLDL: 16.2 mg/dL (ref 0.0–40.0)

## 2011-10-25 LAB — HEPATIC FUNCTION PANEL
ALT: 39 U/L — ABNORMAL HIGH (ref 0–35)
AST: 48 U/L — ABNORMAL HIGH (ref 0–37)
Albumin: 3.8 g/dL (ref 3.5–5.2)
Alkaline Phosphatase: 100 U/L (ref 39–117)
Bilirubin, Direct: 0.1 mg/dL (ref 0.0–0.3)
Total Bilirubin: 0.9 mg/dL (ref 0.3–1.2)
Total Protein: 7.2 g/dL (ref 6.0–8.3)

## 2011-10-25 LAB — BASIC METABOLIC PANEL
BUN: 4 mg/dL — ABNORMAL LOW (ref 6–23)
CO2: 31 mEq/L (ref 19–32)
Calcium: 8.6 mg/dL (ref 8.4–10.5)
Chloride: 105 mEq/L (ref 96–112)
Creatinine, Ser: 0.6 mg/dL (ref 0.4–1.2)
GFR: 147.98 mL/min (ref >60.00)
Glucose, Bld: 117 mg/dL — ABNORMAL HIGH (ref 70–99)
Potassium: 3.8 mEq/L (ref 3.5–5.1)
Sodium: 143 mEq/L (ref 135–145)

## 2011-10-25 LAB — TROPONIN I: Troponin I: <0.3 ng/mL (ref <0.06)

## 2011-10-25 LAB — TSH: TSH: 1.77 u[IU]/mL (ref 0.35–5.50)

## 2011-10-25 LAB — D-DIMER, QUANTITATIVE: D-Dimer, Quant: 0.36 ug/mL-FEU (ref 0.00–0.48)

## 2011-10-25 MED ORDER — AZITHROMYCIN 250 MG PO TABS: ORAL_TABLET | ORAL | Status: AC

## 2011-10-25 MED ORDER — BENZONATATE 200 MG PO CAPS: 200.0000 mg | ORAL_CAPSULE | Freq: Two times a day (BID) | ORAL | Status: AC | PRN

## 2011-10-25 MED ORDER — METOPROLOL TARTRATE 25 MG PO TABS: 25.0000 mg | ORAL_TABLET | Freq: Two times a day (BID) | ORAL | Status: DC

## 2011-10-25 NOTE — Patient Instructions (Signed)
Chest Pain (Nonspecific) It is often hard to give a specific diagnosis for the cause of chest pain. There is always a chance that your pain could be related to something serious, such as a heart attack or a blood clot in the lungs. You need to follow up with your caregiver for further evaluation. CAUSES   Heartburn.   Pneumonia or bronchitis.   Anxiety and stress.   Inflammation around your heart (pericarditis) or lung (pleuritis or pleurisy).   A blood clot in the lung.   A collapsed lung (pneumothorax). It can develop suddenly on its own (spontaneous pneumothorax) or from injury (trauma) to the chest.  The chest wall is composed of bones, muscles, and cartilage. Any of these can be the source of the pain.  The bones can be bruised by injury.   The muscles or cartilage can be strained by coughing or overwork.   The cartilage can be affected by inflammation and become sore (costochondritis).  DIAGNOSIS  Lab tests or other studies, such as X-rays, an EKG, stress testing, or cardiac imaging, may be needed to find the cause of your pain.  TREATMENT   Treatment depends on what may be causing your chest pain. Treatment may include:   Acid blockers for heartburn.   Anti-inflammatory medicine.   Pain medicine for inflammatory conditions.   Antibiotics if an infection is present.   You may be advised to change lifestyle habits. This includes stopping smoking and avoiding caffeine and chocolate.   You may be advised to keep your head raised (elevated) when sleeping. This reduces the chance of acid going backward from your stomach into your esophagus.   Most of the time, nonspecific chest pain will improve within 2 to 3 days with rest and mild pain medicine.  HOME CARE INSTRUCTIONS   If antibiotics were prescribed, take the full amount even if you start to feel better.   For the next few days, avoid physical activities that bring on chest pain. Continue physical activities as  directed.   Do not smoke cigarettes or drink alcohol until your symptoms are gone.   Only take over-the-counter or prescription medicine for pain, discomfort, or fever as directed by your caregiver.   Follow your caregiver's suggestions for further testing if your chest pain does not go away.   Keep any follow-up appointments you made. If you do not go to an appointment, you could develop lasting (chronic) problems with pain. If there is any problem keeping an appointment, you must call to reschedule.  SEEK MEDICAL CARE IF:   You think you are having problems from the medicine you are taking. Read your medicine instructions carefully.   Your chest pain does not go away, even after treatment.   You develop a rash with blisters on your chest.  SEEK IMMEDIATE MEDICAL CARE IF:   You have increased chest pain or pain that spreads to your arm, neck, jaw, back, or belly (abdomen).   You develop shortness of breath, an increasing cough, or you are coughing up blood.   You have severe back or abdominal pain, feel sick to your stomach (nauseous) or throw up (vomit).   You develop severe weakness, fainting, or chills.   You have an oral temperature above 102 F (38.9 C), not controlled by medicine.  THIS IS AN EMERGENCY. Do not wait to see if the pain will go away. Get medical help at once. Call your local emergency services (911 in [place] Do not drive yourself to   the hospital. MAKE SURE YOU:   Understand these instructions.   Will watch your condition.   Will get help right away if you are not doing well or get worse.  Document Released: 07/13/2005 Document Revised: 06/15/2011 Document Reviewed: 05/08/2008 [employer] Patient Information 2012 [employer]. 

## 2011-10-25 NOTE — Telephone Encounter (Signed)
She is having chest pain--- that is not the best decision for her

## 2011-10-25 NOTE — Progress Notes (Signed)
  Subjective:    Heather Walker is a 51 y.o. female who presents for evaluation of chest pain. Onset was 2 months ago. Symptoms have worsened since that time. The patient describes the pain as sharp, like stick pins and radiates to the right side chest. Patient rates pain as a 10/10 in intensity. Associated symptoms are: chest pain and dyspnea. Aggravating factors are: none. Alleviating factors are: none. Patient's cardiac risk factors are: hypertension, obesity (BMI >= 30 kg/m2) and sedentary lifestyle. Patient's risk factors for DVT/PE: none. Previous cardiac testing: electrocardiogram (ECG).  The following portions of the patient's history were reviewed and updated as appropriate: allergies, current medications, past family history, past medical history, past social history, past surgical history and problem list.  Review of Systems Pertinent items are noted in HPI.    Objective:    BP 184/110  Pulse 74  Temp(Src) 98.8 F (37.1 C) (Oral)  Wt 231 lb 6.4 oz (104.962 kg)  SpO2 97% General appearance: alert, cooperative, appears stated age and no distress Ears: normal TM's and external ear canals both ears Nose: Nares normal. Septum midline. Mucosa normal. No drainage or sinus tenderness. Lungs: diminished breath sounds bilaterally Heart: S1, S2 normal and + SEM  2/6 Extremities: extremities normal, atraumatic, no cyanosis or edema  Cardiographics ECG: normal sinus rhythm, no blocks or conduction defects, no ischemic changes  Imaging Chest x-ray: not available for review    Assessment:    Chest pain, suspected etiology: chest wall pain   bronchtis--- z pak,, tessalon perles Plan:    Patient history and exam consistent with non-cardiac cause of chest pain. OTC analgesics as needed. Serial cardiac markers and ECG.

## 2011-10-25 NOTE — Telephone Encounter (Signed)
Patient states that she cannot go to get her chest x-ray this afternoon. She states that she will be going tomorrow afternoon.

## 2011-10-25 NOTE — Telephone Encounter (Signed)
discussed with patient and she agreed to have the CXR done tomorrow.     KP

## 2011-10-25 NOTE — Telephone Encounter (Signed)
Spoke with patient and she is having transportation issues, she promised to get the Cxr done in the morning when her son is available to take her. I advised if the chest pain come back and is severe she needs to call 911. Patient voiced understanding and agreed to do so.      KP

## 2011-10-27 ENCOUNTER — Telehealth: Payer: Self-pay

## 2011-10-28 NOTE — Telephone Encounter (Signed)
Called [facility] to get patient some assisstance----- Due to her Medicaid she does not qualify for [facility]. Dr.Lowne made aware    KP

## 2011-11-04 ENCOUNTER — Telehealth: Payer: Self-pay

## 2011-11-04 DIAGNOSIS — R7989 Other specified abnormal findings of blood chemistry: Secondary | ICD-10-CM

## 2011-11-04 NOTE — Telephone Encounter (Signed)
Discussed with patient and she agreed to have the US done at [facility]... Order put in.     KP

## 2011-11-04 NOTE — Telephone Encounter (Signed)
Message copied by Arnette Norris on Fri Nov 04, 2011  3:35 PM ------      Message from: Lelon Perla      Created: Tue Nov 01, 2011 12:10 PM       Could just be fatty liver----US abd

## 2011-11-10 ENCOUNTER — Ambulatory Visit (HOSPITAL_COMMUNITY)
Admission: RE | Admit: 2011-11-10 | Discharge: 2011-11-10 | Disposition: A | Discharge status: Home / Self Care | Payer: Self-pay | Source: Ambulatory Visit | Attending: Family Medicine | Admitting: Family Medicine

## 2011-11-10 DIAGNOSIS — K7689 Other specified diseases of liver: Secondary | ICD-10-CM | POA: Insufficient documentation

## 2011-11-10 DIAGNOSIS — R7989 Other specified abnormal findings of blood chemistry: Secondary | ICD-10-CM | POA: Insufficient documentation

## 2011-11-10 DIAGNOSIS — K838 Other specified diseases of biliary tract: Secondary | ICD-10-CM | POA: Insufficient documentation

## 2011-11-14 ENCOUNTER — Other Ambulatory Visit: Payer: Self-pay | Admitting: Family Medicine

## 2011-11-14 DIAGNOSIS — K838 Other specified diseases of biliary tract: Secondary | ICD-10-CM

## 2011-11-14 DIAGNOSIS — R7989 Other specified abnormal findings of blood chemistry: Secondary | ICD-10-CM

## 2011-11-14 NOTE — Telephone Encounter (Signed)
error 

## 2011-11-15 ENCOUNTER — Encounter: Payer: Medicaid Other | Admitting: Family Medicine

## 2011-11-16 ENCOUNTER — Other Ambulatory Visit (HOSPITAL_COMMUNITY): Payer: Self-pay

## 2011-11-16 ENCOUNTER — Telehealth: Payer: Self-pay | Admitting: Family Medicine

## 2011-11-16 NOTE — Telephone Encounter (Signed)
Patient called the office asking to speak with Dr. Laury Axon in regards to the appointment that was scheduled for her with the specialist ([facility]?). She wanted to let Dr. Laury Axon know that she cancelled the appointment because she no longer has insurance and can't afford to see the specialist. Best number to reach her is [phone].

## 2011-11-16 NOTE — Telephone Encounter (Signed)
FYI  KP

## 2011-11-16 NOTE — Telephone Encounter (Signed)
Discussed with patient and she voiced understanding and agreed to call [facility].   KP

## 2011-11-16 NOTE — Telephone Encounter (Signed)
Let her know that payment arrangements can be made through [employer]----some people don't pay anything----- or she should get into [facility] so they can take care of her.

## 2011-11-17 ENCOUNTER — Encounter: Payer: Self-pay | Admitting: Gastroenterology

## 2011-11-17 ENCOUNTER — Telehealth: Payer: Self-pay | Admitting: Family Medicine

## 2011-11-28 ENCOUNTER — Other Ambulatory Visit: Payer: Self-pay | Admitting: Family Medicine

## 2011-11-28 ENCOUNTER — Telehealth: Payer: Self-pay | Admitting: *Deleted

## 2011-11-28 MED ORDER — HYDROCODONE-ACETAMINOPHEN 7.5-500 MG PO TABS: 1.0000 | ORAL_TABLET | Freq: Four times a day (QID) | ORAL | Status: DC | PRN

## 2011-11-28 NOTE — Telephone Encounter (Signed)
Rx faxed.    KP 

## 2011-11-28 NOTE — Telephone Encounter (Signed)
Pt c/o arthritis pain in right shoulder extend down back and arm. Pt would like to know if Dr Laury Axon would be willing to call in Rx for a pain med. .Please advise   [facility] ring

## 2011-11-28 NOTE — Telephone Encounter (Signed)
refil lortab x1

## 2011-12-19 ENCOUNTER — Ambulatory Visit: Payer: Self-pay | Admitting: Gastroenterology

## 2011-12-27 ENCOUNTER — Telehealth: Payer: Self-pay | Admitting: Family Medicine

## 2011-12-27 NOTE — Telephone Encounter (Signed)
Patient called & would like to re-establish this referral see below copied from appointment desk  SIDRAH, HARDEN MRN: [id number]  Date: 12/19/2011 Status: No Show  Time: 1:30 PM  Length: 15   Visit Type: NEW PATIENT 15 WQ [696]  Copay: $0.00  Provider: Louis Meckel, MD Department: [facility]  Referring Provider: Lelon Perla CSN: [id number]  Notes: ELEVATED LFT'S/ACQUIRE DILATION OF COMUN BILE DUCT/NO GI HX PER R[facility]/SLIDING FEE/MAILER SENT/CX FEE/YF   Made On: EOD List: EOD Status:  11/17/2011 12:11 PM 12/19/2011 2:29 PM 12/23/2011 12:35 AM  By: By: By:  Lucio Edward, JOE    Appointment Questionnaire Information   FYI patient does not have insurance

## 2011-12-27 NOTE — Telephone Encounter (Signed)
Patient's appointment has been rescheduled, and I have notified patient.  Also reminded patient if unable to keep her appointment, a 24 hour notice is required to avoid $50 fee.

## 2011-12-30 ENCOUNTER — Emergency Department (INDEPENDENT_AMBULATORY_CARE_PROVIDER_SITE_OTHER)
Admission: EM | Admit: 2011-12-30 | Discharge: 2011-12-30 | Disposition: A | Discharge status: Home / Self Care | Payer: Self-pay | Source: Home / Self Care | Attending: Emergency Medicine | Admitting: Emergency Medicine

## 2011-12-30 ENCOUNTER — Encounter (HOSPITAL_COMMUNITY): Payer: Self-pay

## 2011-12-30 DIAGNOSIS — I10 Essential (primary) hypertension: Secondary | ICD-10-CM

## 2011-12-30 DIAGNOSIS — IMO0002 Reserved for concepts with insufficient information to code with codable children: Secondary | ICD-10-CM

## 2011-12-30 HISTORY — DX: Unspecified osteoarthritis, unspecified site: M19.90

## 2011-12-30 MED ORDER — TRAMADOL HCL 50 MG PO TABS: 100.0000 mg | ORAL_TABLET | Freq: Three times a day (TID) | ORAL | Status: AC | PRN

## 2011-12-30 MED ORDER — PREDNISONE 5 MG PO KIT: 1.0000 | PACK | Freq: Every day | ORAL | Status: DC

## 2011-12-30 MED ORDER — CLONIDINE HCL 0.2 MG PO TABS: 0.1000 mg | ORAL_TABLET | Freq: Two times a day (BID) | ORAL | Status: DC

## 2011-12-30 MED ORDER — METHOCARBAMOL 500 MG PO TABS: 500.0000 mg | ORAL_TABLET | Freq: Three times a day (TID) | ORAL | Status: AC

## 2011-12-30 MED ORDER — METHYLPREDNISOLONE ACETATE 40 MG/ML IJ SUSP
INTRAMUSCULAR | Status: AC
  Filled 2011-12-30: qty 10

## 2011-12-30 MED ORDER — METHYLPREDNISOLONE ACETATE PF 80 MG/ML IJ SUSP
80.0000 mg | Freq: Once | INTRAMUSCULAR | Status: AC
  Administered 2011-12-30: 80 mg via INTRAMUSCULAR

## 2011-12-30 MED ORDER — CLONIDINE HCL 0.1 MG PO TABS: 0.2000 mg | ORAL_TABLET | Freq: Once | ORAL | Status: DC

## 2011-12-30 NOTE — ED Notes (Addendum)
Recheck checked patients B/P manually and it was 140/84. Notified Physician and held clonidine tablet 0.2 mg.

## 2011-12-30 NOTE — ED Notes (Addendum)
Pt states she had been having lower pain for about 2 weeks, pain rate is 10, took hydroco/acetamin yesterday, pt states she has not taken any pain medicine today.  Pt also stated that she is dizziness. checked b/p 180/108 notified Physician, pt states she has crippling arthritis in her back and bilateral knees.

## 2011-12-30 NOTE — ED Provider Notes (Signed)
Chief Complaint  Patient presents with  . Back Pain    History of Present Illness:   Heather Walker is a 51 year old female who presents today with back pain and elevated blood pressure.  She's had back pain for many years. She sees Dr. Reynolds Bowl for this. An MRI done in 2010 showed some bulging discs, some annular rents, and facet degenerative disease. These were concentrated around L4-S1. She states she's seen 2 surgeons and neither have recommended surgery. She has gotten epidural steroids in the past, she thinks her last treatment was in 2010. This had temporary effect but the pain came back. Her back pain began getting worse about 3 weeks ago and she denies any precipitating factor. This gradually been worsening since then and now is severe, rated 10 over 10 in intensity. She tried hydrocodone, even though she has an allergic reaction listed to this, and she did have some itching but no rash. The pain is localized to the right lower back around the sacroiliac area with radiation down the right leg as far as the ankle. There is no numbness or tingling. The leg feels weak and she's had several falls. She denies any bladder or bowel complaints. She is sitting in a wheelchair and has difficulty getting up standing, walking, or bending.  She also has a long history of high blood pressure. She is on triamterene/HCTZ every other day and another blood pressure pill which looks like it's Toprol which she is not taking at all. Today she feels lightheaded but denies any headache or blurry vision. She hasn't had any chest pain or shortness of breath.  Review of Systems:  Other than noted above, the patient denies any of the following symptoms: Systemic:  No fever, chills, fatigue, or weight loss. GI:  No abdominal pain, nausea, vomiting, diarrhea, constipation or blood in stool. GU:  No dysuria, frequency, urgency, or hematuria. No incontinence or difficulty urinating.  M-S:  No neck pain, joint pain, arthritis, or  myalgias. Neuro:  No parethesias or muscular weakness. Skin:  No rash or itching.   PMFSH:  Past medical history, family history, social history, meds, and allergies were reviewed.  Physical Exam:   Vital signs:  BP 140/84  Pulse 69  Temp(Src) 98.5 F (36.9 C) (Oral)  Resp 20  Ht 5\' 4"  (1.626 m)  Wt 229 lb (103.874 kg)  BMI 39.31 kg/m2  SpO2 93% General:  Alert, oriented, in no distress. Abdomen:  Soft, non-tender.  No organomegaly or mass.  No pulsatile midline abdominal mass or bruit. Back:  There is tenderness to palpation over the lower lumbar spine, sacroiliac area on the right, and paravertebral muscles. The patient is in a wheelchair, she has difficulty standing up, walking, bending. Straight leg raising produced low back pain on the right but no radiating pain and was negative on the left. Neuro:  Normal muscle strength, sensations and DTRs. Skin:  Clear, warm and dry.  No rash.  Course in Urgent Care Center:   She was given Depo-Medrol 80 mg IM. Because of an allergy to opioids, I was reluctant to give her any injectable opioid medications. She thinks she has taken tramadol in the past without any adverse effect, so I did give her a prescription for this orally. I was also reluctant to give her Toradol since her blood pressure is very high, although did come down after we rechecked it. We did not give her any Catapres since her blood pressure came down on its own.  Assessment:   Diagnoses that have been ruled out:  None  Diagnoses that are still under consideration:  None  Final diagnoses:  Degenerative disc disease  Hypertension    Plan:   1.  The following meds were prescribed:   New Prescriptions   CLONIDINE (CATAPRES) 0.2 MG TABLET    Take 0.5 tablets (0.1 mg total) by mouth 2 (two) times daily.   METHOCARBAMOL (ROBAXIN) 500 MG TABLET    Take 1 tablet (500 mg total) by mouth 3 (three) times daily.   PREDNISONE 5 MG KIT    Take 1 kit (5 mg total) by mouth daily  after breakfast. Prednisone 5 mg 6 day dosepack.  Take as directed.   TRAMADOL (ULTRAM) 50 MG TABLET    Take 2 tablets (100 mg total) by mouth every 8 (eight) hours as needed for pain.   2.  The patient was instructed in symptomatic care and handouts were given. 3.  The patient was told to return if becoming worse in any way, if no better in 3 or 4 days, and given some red flag symptoms that would indicate earlier return.  Follow up:  The patient was told to follow up with Dr. Reynolds Bowl for both her back pain and her blood pressure within a week.    Reuben Likes, MD 12/30/11 1452

## 2011-12-30 NOTE — Discharge Instructions (Signed)
Arterial Hypertension Arterial hypertension (high blood pressure) is a condition of elevated pressure in your blood vessels. Hypertension over a long period of time is a risk factor for strokes, heart attacks, and heart failure. It is also the leading cause of kidney (renal) failure.  CAUSES   In Adults -- Over 90% of all hypertension has no known cause. This is called essential or primary hypertension. In the other 10% of people with hypertension, the increase in blood pressure is caused by another disorder. This is called secondary hypertension. Important causes of secondary hypertension are:   Heavy alcohol use.   Obstructive sleep apnea.   Hyperaldosterosim (Conn's syndrome).   Steroid use.   Chronic kidney failure.   Hyperparathyroidism.   Medications.   Renal artery stenosis.   Pheochromocytoma.   Cushing's disease.   Coarctation of the aorta.   Scleroderma renal crisis.   Licorice (in excessive amounts).   Drugs (cocaine, methamphetamine).  Your caregiver can explain any items above that apply to you.  In Children -- Secondary hypertension is more common and should always be considered.   Pregnancy -- Few women of childbearing age have high blood pressure. However, up to 10% of them develop hypertension of pregnancy. Generally, this will not harm the woman. It Even be a sign of 3 complications of pregnancy: preeclampsia, HELLP syndrome, and eclampsia. Follow up and control with medication is necessary.  SYMPTOMS   This condition normally does not produce any noticeable symptoms. It is usually found during a routine exam.   Malignant hypertension is a late problem of high blood pressure. It Monger have the following symptoms:   Headaches.   Blurred vision.   End-organ damage (this means your kidneys, heart, lungs, and other organs are being damaged).   Stressful situations can increase the blood pressure. If a person with normal blood pressure has their blood  pressure go up while being seen by their caregiver, this is often termed "white coat hypertension." Its importance is not known. It Alkire be related with eventually developing hypertension or complications of hypertension.   Hypertension is often confused with mental tension, stress, and anxiety.  DIAGNOSIS  The diagnosis is made by 3 separate blood pressure measurements. They are taken at least 1 week apart from each other. If there is organ damage from hypertension, the diagnosis Lemire be made without repeat measurements. Hypertension is usually identified by having blood pressure readings:  Above 140/90 mmHg measured in both arms, at 3 separate times, over a couple weeks.   Over 130/80 mmHg should be considered a risk factor and Grisanti require treatment in patients with diabetes.  Blood pressure readings over 120/80 mmHg are called "pre-hypertension" even in non-diabetic patients. To get a true blood pressure measurement, use the following guidelines. Be aware of the factors that can alter blood pressure readings.  Take measurements at least 1 hour after caffeine.   Take measurements 30 minutes after smoking and without any stress. This is another reason to quit smoking - it raises your blood pressure.   Use a proper cuff size. Ask your caregiver if you are not sure about your cuff size.   Most home blood pressure cuffs are automatic. They will measure systolic and diastolic pressures. The systolic pressure is the pressure reading at the start of sounds. Diastolic pressure is the pressure at which the sounds disappear. If you are elderly, measure pressures in multiple postures. Try sitting, lying or standing.   Sit at rest for a minimum of   5 minutes before taking measurements.   You should not be on any medications like decongestants. These are found in many cold medications.   Record your blood pressure readings and review them with your caregiver.  If you have hypertension:  Your caregiver  may do tests to be sure you do not have secondary hypertension (see "causes" above).   Your caregiver may also look for signs of metabolic syndrome. This is also called Syndrome X or Insulin Resistance Syndrome. You may have this syndrome if you have type 2 diabetes, abdominal obesity, and abnormal blood lipids in addition to hypertension.   Your caregiver will take your medical and family history and perform a physical exam.   Diagnostic tests may include blood tests (for glucose, cholesterol, potassium, and kidney function), a urinalysis, or an EKG. Other tests may also be necessary depending on your condition.  PREVENTION  There are important lifestyle issues that you can adopt to reduce your chance of developing hypertension:  Maintain a normal weight.   Limit the amount of salt (sodium) in your diet.   Exercise often.   Limit alcohol intake.   Get enough potassium in your diet. Discuss specific advice with your caregiver.   Follow a DASH diet (dietary approaches to stop hypertension). This diet is rich in fruits, vegetables, and low-fat dairy products, and avoids certain fats.  PROGNOSIS  Essential hypertension cannot be cured. Lifestyle changes and medical treatment can lower blood pressure and reduce complications. The prognosis of secondary hypertension depends on the underlying cause. Many people whose hypertension is controlled with medicine or lifestyle changes can live a normal, healthy life.  RISKS AND COMPLICATIONS  While high blood pressure alone is not an illness, it often requires treatment due to its short- and long-term effects on many organs. Hypertension increases your risk for:  CVAs or strokes (cerebrovascular accident).   Heart failure due to chronically high blood pressure (hypertensive cardiomyopathy).   Heart attack (myocardial infarction).   Damage to the retina (hypertensive retinopathy).   Kidney failure (hypertensive nephropathy).  Your caregiver can  explain list items above that apply to you. Treatment of hypertension can significantly reduce the risk of complications. TREATMENT   For overweight patients, weight loss and regular exercise are recommended. Physical fitness lowers blood pressure.   Mild hypertension is usually treated with diet and exercise. A diet rich in fruits and vegetables, fat-free dairy products, and foods low in fat and salt (sodium) can help lower blood pressure. Decreasing salt intake decreases blood pressure in a 1/3 of people.   Stop smoking if you are a smoker.  The steps above are highly effective in reducing blood pressure. While these actions are easy to suggest, they are difficult to achieve. Most patients with moderate or severe hypertension end up requiring medications to bring their blood pressure down to a normal level. There are several classes of medications for treatment. Blood pressure pills (antihypertensives) will lower blood pressure by their different actions. Lowering the blood pressure by 10 mmHg may decrease the risk of complications by as much as 25%. The goal of treatment is effective blood pressure control. This will reduce your risk for complications. Your caregiver will help you determine the best treatment for you according to your lifestyle. What is excellent treatment for one person, may not be for you. HOME CARE INSTRUCTIONS   Do not smoke.   Follow the lifestyle changes outlined in the "Prevention" section.   If you are on medications, follow the directions   carefully. Blood pressure medications must be taken as prescribed. Skipping doses reduces their benefit. It also puts you at risk for problems.   Follow up with your caregiver, as directed.   If you are asked to monitor your blood pressure at home, follow the guidelines in the "Diagnosis" section above.  SEEK MEDICAL CARE IF:   You think you are having medication side effects.   You have recurrent headaches or lightheadedness.     You have swelling in your ankles.   You have trouble with your vision.  SEEK IMMEDIATE MEDICAL CARE IF:   You have sudden onset of chest pain or pressure, difficulty breathing, or other symptoms of a heart attack.   You have a severe headache.   You have symptoms of a stroke (such as sudden weakness, difficulty speaking, difficulty walking).  MAKE SURE YOU:   Understand these instructions.   Will watch your condition.   Will get help right away if you are not doing well or get worse.  Document Released: 10/03/2005 Document Revised: 09/22/2011 Document Reviewed: 05/03/2007 [facility] Patient Information 2012 [place], [place].Back Exercises Back exercises help treat and prevent back injuries. The goal of back exercises is to increase the strength of your abdominal and back muscles and the flexibility of your back. These exercises should be started when you no longer have back pain. Back exercises include:  Pelvic Tilt. Lie on your back with your knees bent. Tilt your pelvis until the lower part of your back is against the floor. Hold this position 5 to 10 sec and repeat 5 to 10 times.   Knee to Chest. Pull first 1 knee up against your chest and hold for 20 to 30 seconds, repeat this with the other knee, and then both knees. This may be done with the other leg straight or bent, whichever feels better.   Sit-Ups or Curl-Ups. Bend your knees 90 degrees. Start with tilting your pelvis, and do a partial, slow sit-up, lifting your trunk only 30 to 45 degrees off the floor. Take at least 2 to 3 seconds for each sit-up. Do not do sit-ups with your knees out straight. If partial sit-ups are difficult, simply do the above but with only tightening your abdominal muscles and holding it as directed.   Hip-Lift. Lie on your back with your knees flexed 90 degrees. Push down with your feet and shoulders as you raise your hips a couple inches off the floor; hold for 10 seconds, repeat 5 to 10 times.    Back arches. Lie on your stomach, propping yourself up on bent elbows. Slowly press on your hands, causing an arch in your low back. Repeat 3 to 5 times. Any initial stiffness and discomfort should lessen with repetition over time.   Shoulder-Lifts. Lie face down with arms beside your body. Keep hips and torso pressed to floor as you slowly lift your head and shoulders off the floor.  Do not overdo your exercises, especially in the beginning. Exercises may cause you some mild back discomfort which lasts for a few minutes; however, if the pain is more severe, or lasts for more than 15 minutes, do not continue exercises until you see your caregiver. Improvement with exercise therapy for back problems is slow.  See your caregivers for assistance with developing a proper back exercise program. Document Released: 11/10/2004 Document Revised: 09/22/2011 Document Reviewed: 10/03/2005 [facility] Patient Information 2012 [place], [place].

## 2012-01-17 ENCOUNTER — Emergency Department (HOSPITAL_COMMUNITY): Payer: Self-pay

## 2012-01-17 ENCOUNTER — Emergency Department (HOSPITAL_COMMUNITY)
Admission: EM | Admit: 2012-01-17 | Discharge: 2012-01-17 | Disposition: A | Discharge status: Home / Self Care | Payer: Self-pay | Attending: Emergency Medicine | Admitting: Emergency Medicine

## 2012-01-17 DIAGNOSIS — F3289 Other specified depressive episodes: Secondary | ICD-10-CM | POA: Insufficient documentation

## 2012-01-17 DIAGNOSIS — M545 Low back pain, unspecified: Secondary | ICD-10-CM | POA: Insufficient documentation

## 2012-01-17 DIAGNOSIS — M129 Arthropathy, unspecified: Secondary | ICD-10-CM | POA: Insufficient documentation

## 2012-01-17 DIAGNOSIS — M79604 Pain in right leg: Secondary | ICD-10-CM

## 2012-01-17 DIAGNOSIS — J45909 Unspecified asthma, uncomplicated: Secondary | ICD-10-CM | POA: Insufficient documentation

## 2012-01-17 DIAGNOSIS — M79609 Pain in unspecified limb: Secondary | ICD-10-CM | POA: Insufficient documentation

## 2012-01-17 DIAGNOSIS — Z79899 Other long term (current) drug therapy: Secondary | ICD-10-CM | POA: Insufficient documentation

## 2012-01-17 DIAGNOSIS — I10 Essential (primary) hypertension: Secondary | ICD-10-CM | POA: Insufficient documentation

## 2012-01-17 DIAGNOSIS — F329 Major depressive disorder, single episode, unspecified: Secondary | ICD-10-CM | POA: Insufficient documentation

## 2012-01-17 MED ORDER — OXYCODONE-ACETAMINOPHEN 5-325 MG PO TABS: 1.0000 | ORAL_TABLET | Freq: Four times a day (QID) | ORAL | Status: AC | PRN

## 2012-01-17 MED ORDER — CYCLOBENZAPRINE HCL 10 MG PO TABS: 10.0000 mg | ORAL_TABLET | Freq: Two times a day (BID) | ORAL | Status: DC | PRN

## 2012-01-17 MED ORDER — HYDROMORPHONE HCL PF 1 MG/ML IJ SOLN
1.0000 mg | Freq: Once | INTRAMUSCULAR | Status: AC
  Administered 2012-01-17: 1 mg via INTRAVENOUS
  Filled 2012-01-17: qty 1

## 2012-01-17 MED ORDER — ONDANSETRON HCL 4 MG/2ML IJ SOLN
4.0000 mg | Freq: Once | INTRAMUSCULAR | Status: AC
  Administered 2012-01-17: 4 mg via INTRAVENOUS
  Filled 2012-01-17: qty 2

## 2012-01-17 NOTE — ED Notes (Signed)
Back pain & RT knee pain x 2 weeks d/t rheumatoid arthritis.  Was seen at [facility] 2 weeks ago for same-given steroid shot and prednisone w/o relief.  Has trouble moving: sitting, standing, walking, etc.  Crying on arrival. EMS reports BP 200/120, 72.

## 2012-01-17 NOTE — ED Notes (Signed)
Requesting ice water

## 2012-01-17 NOTE — Discharge Instructions (Signed)
Return to the ED with any concerns including weakness of legs, not able to urinate, fever, loss of control of bowel or bladder, decreased level of alertness/lethargy, or any other alarming symptoms

## 2012-01-17 NOTE — ED Provider Notes (Signed)
History     CSN: [id number]  Arrival date & time 01/17/12  1133   First MD Initiated Contact with Patient 01/17/12 1220      Chief Complaint  Patient presents with  . Back Pain    (Consider location/radiation/quality/duration/timing/severity/associated sxs/prior treatment) HPI Pt presents with c/o right sided low back pain.  Pain is sharp/aching and worse with movement and palpation.  She has radiation of pain down right leg.  No weakness of leg, no loss of control of bowel or bladder, or retention of urine.  No fever.  Pt has had back pain in the past that flares up off and on- she has been told she has arthritis in her back.  Has seen 2 neurosurgeons who did not recommend surgery.  She has had no new trauma or falls.  Pain has been worse over the past several days.  Was seen at urgent care and given an injection of steroids.    Past Medical History  Diagnosis Date  . Hypertension   . Depression   . Asthma   . Arthritis     Past Surgical History  Procedure Date  . Abdominal hysterectomy   . Bladder suspension   . Appendectomy     Family History  Problem Relation Age of Onset  . Hypertension Mother   . Asthma Mother   . Hypertension Sister   . Asthma Sister   . Hypertension Maternal Grandmother   . Heart defect Sister   . Asthma Sister   . Aneurysm Sister   . Asthma Sister     History  Substance Use Topics  . Smoking status: Never Smoker   . Smokeless tobacco: Never Used  . Alcohol Use: No    OB History    Grav Para Term Preterm Abortions TAB SAB Ect Mult Living                  Review of Systems ROS reviewed and all otherwise negative except for mentioned in HPI  Allergies  Codeine  Home Medications   Current Outpatient Rx  Name Route Sig Dispense Refill  . ALBUTEROL SULFATE HFA 108 (90 BASE) MCG/ACT IN AERS Inhalation Inhale 2 puffs into the lungs every 6 (six) hours as needed for wheezing. 1 Inhaler 1  . AMLODIPINE BESYLATE 5 MG PO TABS Oral  Take 5 mg by mouth daily.    . BECLOMETHASONE DIPROPIONATE 40 MCG/ACT IN AERS Inhalation Inhale 2 puffs into the lungs 2 (two) times daily. 1 Inhaler 12  . CITALOPRAM HYDROBROMIDE 20 MG PO TABS Oral Take 20 mg by mouth daily.    Marland Kitchen CLONIDINE HCL 0.2 MG PO TABS Oral Take 0.1 mg by mouth 2 (two) times daily. Pt takes 1/2 tab twice daily    . CYCLOBENZAPRINE HCL 5 MG PO TABS Oral Take 1 tablet (5 mg total) by mouth 3 (three) times daily as needed. 30 tablet 1  . HYDROCODONE-ACETAMINOPHEN 5-325 MG PO TABS Oral Take 1 tablet by mouth. 4 to 6 hours prn for pain     . METHOCARBAMOL 500 MG PO TABS Oral Take 500 mg by mouth 3 (three) times daily as needed. Muscle spasms    . TRAMADOL HCL 50 MG PO TABS Oral Take 100 mg by mouth every 6 (six) hours as needed. pain    . CYCLOBENZAPRINE HCL 10 MG PO TABS Oral Take 1 tablet (10 mg total) by mouth 2 (two) times daily as needed for muscle spasms. 20 tablet 0  . HYDROCODONE-ACETAMINOPHEN  7.5-500 MG PO TABS Oral Take 1 tablet by mouth 4 (four) times daily as needed. 30 tablet 0  . METOPROLOL TARTRATE 25 MG PO TABS Oral Take 1 tablet (25 mg total) by mouth 2 (two) times daily. 180 tablet 3  . OXYCODONE-ACETAMINOPHEN 5-325 MG PO TABS Oral Take 1-2 tablets by mouth every 6 (six) hours as needed for pain. 15 tablet 0  . PREDNISONE 5 MG PO KIT Oral Take 1 kit (5 mg total) by mouth daily after breakfast. Prednisone 5 mg 6 day dosepack.  Take as directed. 1 kit 0  . TRIAMTERENE-HCTZ 75-50 MG PO TABS        BP 161/99  Pulse 73  Temp(Src) 98.5 F (36.9 C) (Rectal)  Resp 18  Ht 5\' 4"  (1.626 m)  Wt 229 lb (103.874 kg)  BMI 39.31 kg/m2  SpO2 97% Vitals reviewed Physical Exam Physical Examination: General appearance - alert, well appearing, and in no distress Mental status - alert, oriented to person, place, and time Eyes - pupils equal and reactive Mouth - mucous membranes moist, pharynx normal without lesions Chest - clear to auscultation, no wheezes, rales or  rhonchi, symmetric air entry Heart - normal rate, regular rhythm, normal S1, S2, no murmurs, rubs, clicks or gallops Abdomen - soft, nontender, nondistended, no masses or organomegaly Back- mild lumbar tenderness to palpation, ttp over right paraspinal tenderness Extremities - peripheral pulses normal, no pedal edema, no clubbing or cyanosis Skin - normal coloration and turgor, no rashes,  ED Course  Procedures (including critical care time)  Labs Reviewed - No data to display Dg Lumbar Spine Complete  01/17/2012  *RADIOLOGY REPORT*  Clinical Data: Low back pain.  LUMBAR SPINE - COMPLETE 4+ VIEW  Comparison: None.  Findings: The L5 vertebra is partially sacralized.  The films have been labeled accordingly.  There are no fractures, subluxations, or destructive changes.  There are mild anterior osteophytic changes noted at the thoracolumbar junction and also at the L1-2 and L3-4 levels.  There are bilateral facet joint arthritic changes present at the L4-5 level.  IMPRESSION: Mild degenerative osteophytic changes and bilateral L4-5 facet joint arthritic changes.  No evidence for fracture.  Original Report Authenticated By: Rolla Plate, M.D.   2:13 PM Pt feeling much improved after IV pain meds.  Xray shows chronic degenerative changes.  1. Low back pain radiating to right leg       MDM  Pt with low back pain, no neurologic deficits, no signs or symptoms of cauda equina.  Xray reveals degenerative changes, pt feels much improved after IV pain meds.  Discharged with strict return precautions.  She is agreeable with this plan and will arrange for follow up with Dr. Laury Axon.         Ethelda Chick, MD 01/18/12 [phone]

## 2012-01-25 ENCOUNTER — Ambulatory Visit: Payer: Self-pay | Admitting: Family Medicine

## 2012-01-30 ENCOUNTER — Encounter (HOSPITAL_COMMUNITY): Payer: Self-pay | Admitting: *Deleted

## 2012-01-30 ENCOUNTER — Emergency Department (HOSPITAL_COMMUNITY)
Admission: EM | Admit: 2012-01-30 | Discharge: 2012-01-30 | Disposition: A | Discharge status: Home / Self Care | Payer: Self-pay | Attending: Emergency Medicine | Admitting: Emergency Medicine

## 2012-01-30 ENCOUNTER — Ambulatory Visit: Payer: Self-pay | Admitting: Gastroenterology

## 2012-01-30 DIAGNOSIS — Z79899 Other long term (current) drug therapy: Secondary | ICD-10-CM | POA: Insufficient documentation

## 2012-01-30 DIAGNOSIS — M549 Dorsalgia, unspecified: Secondary | ICD-10-CM

## 2012-01-30 DIAGNOSIS — N390 Urinary tract infection, site not specified: Secondary | ICD-10-CM

## 2012-01-30 DIAGNOSIS — I10 Essential (primary) hypertension: Secondary | ICD-10-CM | POA: Insufficient documentation

## 2012-01-30 LAB — URINE MICROSCOPIC-ADD ON

## 2012-01-30 LAB — URINALYSIS, ROUTINE W REFLEX MICROSCOPIC
Bilirubin Urine: NEGATIVE
Glucose, UA: NEGATIVE mg/dL
Hgb urine dipstick: NEGATIVE
Ketones, ur: NEGATIVE mg/dL
Nitrite: NEGATIVE
Protein, ur: 30 mg/dL — AB
Specific Gravity, Urine: 1.011 (ref 1.005–1.030)
Urobilinogen, UA: 1 mg/dL (ref 0.0–1.0)
pH: 7.5 (ref 5.0–8.0)

## 2012-01-30 MED ORDER — DIAZEPAM 5 MG PO TABS: 5.0000 mg | ORAL_TABLET | Freq: Two times a day (BID) | ORAL | Status: AC | PRN

## 2012-01-30 MED ORDER — MORPHINE SULFATE 4 MG/ML IJ SOLN
4.0000 mg | Freq: Once | INTRAMUSCULAR | Status: AC
  Administered 2012-01-30: 4 mg via INTRAVENOUS
  Filled 2012-01-30: qty 1

## 2012-01-30 MED ORDER — KETOROLAC TROMETHAMINE 15 MG/ML IJ SOLN
15.0000 mg | Freq: Once | INTRAMUSCULAR | Status: AC
  Administered 2012-01-30: 15 mg via INTRAVENOUS
  Filled 2012-01-30: qty 1

## 2012-01-30 MED ORDER — HYDROCODONE-ACETAMINOPHEN 5-325 MG PO TABS: ORAL_TABLET | ORAL | Status: DC

## 2012-01-30 MED ORDER — CIPROFLOXACIN HCL 500 MG PO TABS
500.0000 mg | ORAL_TABLET | Freq: Once | ORAL | Status: AC
  Administered 2012-01-30: 500 mg via ORAL
  Filled 2012-01-30: qty 1

## 2012-01-30 MED ORDER — CIPROFLOXACIN HCL 500 MG PO TABS: 500.0000 mg | ORAL_TABLET | Freq: Two times a day (BID) | ORAL | Status: AC

## 2012-01-30 MED ORDER — DIAZEPAM 5 MG PO TABS
5.0000 mg | ORAL_TABLET | Freq: Once | ORAL | Status: AC
  Administered 2012-01-30: 5 mg via ORAL
  Filled 2012-01-30: qty 1

## 2012-01-30 NOTE — ED Notes (Signed)
Per EMS, pt from home reports L flank pain with dysuria x 2 weeks.  Pt reports taking prednisone at present.  Denies any n/v at this time.

## 2012-01-30 NOTE — ED Notes (Signed)
Pt c/o right flank pain. Here recently for same and dx'd with "cripling arthritis". States pain medicines not helping today. Feels like a "cramping pain", worse with movement.

## 2012-01-30 NOTE — ED Notes (Signed)
Pt sleeping. 

## 2012-01-30 NOTE — ED Provider Notes (Signed)
History     CSN: [id number]  Arrival date & time 01/30/12  1035   First MD Initiated Contact with Patient 01/30/12 1153      Chief Complaint  Patient presents with  . Flank Pain    left    (Consider location/radiation/quality/duration/timing/severity/associated sxs/prior treatment) HPI Comments: Pt has complained of right-sided low back and flank discomfort for the past 2 weeks.  Seen in the ED 2 weeks ago and has been taking prednisone.  She also notes recently some dysuria and urinary freq.  She has had UTI in the past when younger.  She denies overt fevers, has felt hot and cold, some nausea today, but no vomiting.  She denies injury but has had problems with arthritis in the past.  She has been referred to a back specialist in the past, but due to lack of insurance, has not seen.  She denies rash, weakness, urinary difficulty or incontinence.  Patient is a 51 y.o. female presenting with flank pain. The history is provided by the patient.  Flank Pain Pertinent negatives include no abdominal pain.    Past Medical History  Diagnosis Date  . Hypertension   . Depression   . Asthma   . Arthritis     Past Surgical History  Procedure Date  . Abdominal hysterectomy   . Bladder suspension   . Appendectomy     Family History  Problem Relation Age of Onset  . Hypertension Mother   . Asthma Mother   . Hypertension Sister   . Asthma Sister   . Hypertension Maternal Grandmother   . Heart defect Sister   . Asthma Sister   . Aneurysm Sister   . Asthma Sister     History  Substance Use Topics  . Smoking status: Never Smoker   . Smokeless tobacco: Never Used  . Alcohol Use: No    OB History    Grav Para Term Preterm Abortions TAB SAB Ect Mult Living                  Review of Systems  Gastrointestinal: Positive for nausea. Negative for vomiting, abdominal pain, diarrhea and constipation.  Genitourinary: Positive for dysuria and flank pain.  Musculoskeletal:  Positive for back pain.  Skin: Negative for rash.  All other systems reviewed and are negative.    Allergies  Codeine  Home Medications   Current Outpatient Rx  Name Route Sig Dispense Refill  . ALBUTEROL SULFATE HFA 108 (90 BASE) MCG/ACT IN AERS Inhalation Inhale 2 puffs into the lungs every 6 (six) hours as needed for wheezing. 1 Inhaler 1  . BECLOMETHASONE DIPROPIONATE 40 MCG/ACT IN AERS Inhalation Inhale 2 puffs into the lungs 2 (two) times daily. 1 Inhaler 12  . CLONIDINE HCL 0.2 MG PO TABS Oral Take 0.1 mg by mouth 2 (two) times daily. Pt takes 1/2 tab twice daily    . METHOCARBAMOL 500 MG PO TABS Oral Take 500 mg by mouth 3 (three) times daily as needed. Muscle spasms    . OXYCODONE-ACETAMINOPHEN 5-325 MG PO TABS Oral Take 1-2 tablets by mouth See admin instructions. Take 1-2 tablets by mouth every 6 hours as needed for pain    . TRAMADOL HCL 50 MG PO TABS Oral Take 100 mg by mouth every 6 (six) hours as needed. pain    . TRIAMTERENE-HCTZ 75-50 MG PO TABS Oral Take 0.5 tablets by mouth daily.     Marland Kitchen DIAZEPAM 5 MG PO TABS Oral Take 1 tablet (5  mg total) by mouth every 12 (twelve) hours as needed for anxiety. 10 tablet 0  . HYDROCODONE-ACETAMINOPHEN 5-325 MG PO TABS  1-2 tablets po q 6 hours prn moderate to severe pain 20 tablet 0    BP 186/109  Pulse 78  Temp(Src) 97.6 F (36.4 C) (Oral)  Resp 24  SpO2 95%  Physical Exam  Nursing note and vitals reviewed. Constitutional: She is oriented to person, place, and time. She appears well-developed and well-nourished.  HENT:  Head: Normocephalic and atraumatic.  Eyes: Pupils are equal, round, and reactive to light.  Neck: Normal range of motion. Neck supple.  Cardiovascular: Normal rate.   Pulmonary/Chest: No respiratory distress. She has no wheezes.  Abdominal: Soft. There is no rebound and no guarding.  Musculoskeletal:       Lumbar back: She exhibits no bony tenderness.       Back:       + straight leg test on right side  only  Neurological: She is alert and oriented to person, place, and time.  Skin: Skin is warm and dry. No rash noted.    ED Course  Procedures (including critical care time)  Labs Reviewed  URINALYSIS, ROUTINE W REFLEX MICROSCOPIC - Abnormal; Notable for the following:    APPearance CLOUDY (*)    Protein, ur 30 (*)    Leukocytes, UA SMALL (*)    All other components within normal limits  URINE MICROSCOPIC-ADD ON - Abnormal; Notable for the following:    Squamous Epithelial / LPF FEW (*)    Bacteria, UA MANY (*)    All other components within normal limits  URINE CULTURE   No results found.   1. Back pain   2. Urinary tract infection       MDM  UA suggests UTI.  However, exam is sig for reproducible low back pain and tenderness and mild spasm.  Pt reports much improved after valium and IV morphine.  U cx sent, will treat as UTI as well.          Gavin Pound. Shere Eisenhart, MD 01/30/12 1445

## 2012-01-30 NOTE — ED Notes (Signed)
Bed:WA25<BR> Expected date:<BR> Expected time:<BR> Means of arrival:<BR> Comments:<BR> Closed.

## 2012-01-30 NOTE — Discharge Instructions (Signed)
Back Pain, Adult Low back pain is very common. About 1 in 5 people have back pain.The cause of low back pain is rarely dangerous. The pain often gets better over time.About half of people with a sudden onset of back pain feel better in just 2 weeks. About 8 in 10 people feel better by 6 weeks.  CAUSES Some common causes of back pain include:  Strain of the muscles or ligaments supporting the spine.   Wear and tear (degeneration) of the spinal discs.   Arthritis.   Direct injury to the back.  DIAGNOSIS Most of the time, the direct cause of low back pain is not known.However, back pain can be treated effectively even when the exact cause of the pain is unknown.Answering your caregiver's questions about your overall health and symptoms is one of the most accurate ways to make sure the cause of your pain is not dangerous. If your caregiver needs more information, he or she may order lab work or imaging tests (X-rays or MRIs).However, even if imaging tests show changes in your back, this usually does not require surgery. HOME CARE INSTRUCTIONS For many people, back pain returns.Since low back pain is rarely dangerous, it is often a condition that people can learn to manageon their own.   Remain active. It is stressful on the back to sit or stand in one place. Do not sit, drive, or stand in one place for more than 30 minutes at a time. Take short walks on level surfaces as soon as pain allows.Try to increase the length of time you walk each day.   Do not stay in bed.Resting more than 1 or 2 days can delay your recovery.   Do not avoid exercise or work.Your body is made to move.It is not dangerous to be active, even though your back may hurt.Your back will likely heal faster if you return to being active before your pain is gone.   Pay attention to your body when you bend and lift. Many people have less discomfortwhen lifting if they bend their knees, keep the load close to their  bodies,and avoid twisting. Often, the most comfortable positions are those that put less stress on your recovering back.   Find a comfortable position to sleep. Use a firm mattress and lie on your side with your knees slightly bent. If you lie on your back, put a pillow under your knees.   Only take over-the-counter or prescription medicines as directed by your caregiver. Over-the-counter medicines to reduce pain and inflammation are often the most helpful.Your caregiver may prescribe muscle relaxant drugs.These medicines help dull your pain so you can more quickly return to your normal activities and healthy exercise.   Put ice on the injured area.   Put ice in a plastic bag.   Place a towel between your skin and the bag.   Leave the ice on for 15 to 20 minutes, 3 to 4 times a day for the first 2 to 3 days. After that, ice and heat may be alternated to reduce pain and spasms.   Ask your caregiver about trying back exercises and gentle massage. This may be of some benefit.   Avoid feeling anxious or stressed.Stress increases muscle tension and can worsen back pain.It is important to recognize when you are anxious or stressed and learn ways to manage it.Exercise is a great option.  SEEK MEDICAL CARE IF:  You have pain that is not relieved with rest or medicine.   You have   pain that does not improve in 1 week.   You have new symptoms.   You are generally not feeling well.  SEEK IMMEDIATE MEDICAL CARE IF:   You have pain that radiates from your back into your legs.   You develop new bowel or bladder control problems.   You have unusual weakness or numbness in your arms or legs.   You develop nausea or vomiting.   You develop abdominal pain.   You feel faint.  Document Released: 10/03/2005 Document Revised: 09/22/2011 Document Reviewed: 02/21/2011 [facility] Patient Information 2012 [place], [place].   Urinary Tract Infection Infections of the urinary tract can start in  several places. A bladder infection (cystitis), a kidney infection (pyelonephritis), and a prostate infection (prostatitis) are different types of urinary tract infections (UTIs). They usually get better if treated with medicines (antibiotics) that kill germs. Take all the medicine until it is gone. You or your child may feel better in a few days, but TAKE ALL MEDICINE or the infection may not respond and may become more difficult to treat. HOME CARE INSTRUCTIONS   Drink enough water and fluids to keep the urine clear or pale yellow. Cranberry juice is especially recommended, in addition to large amounts of water.   Avoid caffeine, tea, and carbonated beverages. They tend to irritate the bladder.   Alcohol may irritate the prostate.   Only take over-the-counter or prescription medicines for pain, discomfort, or fever as directed by your caregiver.  To prevent further infections:  Empty the bladder often. Avoid holding urine for long periods of time.   After a bowel movement, women should cleanse from front to back. Use each tissue only once.   Empty the bladder before and after sexual intercourse.  FINDING OUT THE RESULTS OF YOUR TEST Not all test results are available during your visit. If your or your child's test results are not back during the visit, make an appointment with your caregiver to find out the results. Do not assume everything is normal if you have not heard from your caregiver or the medical facility. It is important for you to follow up on all test results. SEEK MEDICAL CARE IF:   There is back pain.   Your baby is older than 3 months with a rectal temperature of 100.5 F (38.1 C) or higher for more than 1 day.   Your or your child's problems (symptoms) are no better in 3 days. Return sooner if you or your child is getting worse.  SEEK IMMEDIATE MEDICAL CARE IF:   There is severe back pain or lower abdominal pain.   You or your child develops chills.   You have a  fever.   Your baby is older than 3 months with a rectal temperature of 102 F (38.9 C) or higher.   Your baby is 32 months old or younger with a rectal temperature of 100.4 F (38 C) or higher.   There is nausea or vomiting.   There is continued burning or discomfort with urination.  MAKE SURE YOU:   Understand these instructions.   Will watch your condition.   Will get help right away if you are not doing well or get worse.  Document Released: 07/13/2005 Document Revised: 09/22/2011 Document Reviewed: 02/15/2007 [facility] Patient Information 2012 [place], [place].   Narcotic and benzodiazepine use may cause drowsiness, slowed breathing or dependence.  Please use with caution and do not drive, operate machinery or watch young children alone while taking them.  Taking combinations  of these medications or drinking alcohol will potentiate these effects.

## 2012-02-04 LAB — URINE CULTURE
Colony Count: >=100000
Culture  Setup Time: 201304160515

## 2012-02-05 NOTE — ED Notes (Signed)
+  Urine. Patient treated with Cipro. Sensitive to same. Per protocol MD. °

## 2012-02-17 ENCOUNTER — Ambulatory Visit: Payer: Self-pay | Admitting: Family Medicine

## 2012-06-29 ENCOUNTER — Ambulatory Visit (INDEPENDENT_AMBULATORY_CARE_PROVIDER_SITE_OTHER): Payer: Self-pay | Admitting: Family Medicine

## 2012-06-29 ENCOUNTER — Other Ambulatory Visit (HOSPITAL_COMMUNITY)
Admission: RE | Admit: 2012-06-29 | Discharge: 2012-06-29 | Disposition: A | Discharge status: Home / Self Care | Payer: Self-pay | Source: Ambulatory Visit | Attending: Family Medicine | Admitting: Family Medicine

## 2012-06-29 ENCOUNTER — Encounter: Payer: Self-pay | Admitting: Family Medicine

## 2012-06-29 VITALS — BP 160/96 | HR 81 | Temp 98.5°F | Ht 65.0 in | Wt 223.6 lb

## 2012-06-29 DIAGNOSIS — Z124 Encounter for screening for malignant neoplasm of cervix: Secondary | ICD-10-CM

## 2012-06-29 DIAGNOSIS — IMO0002 Reserved for concepts with insufficient information to code with codable children: Secondary | ICD-10-CM

## 2012-06-29 DIAGNOSIS — J45909 Unspecified asthma, uncomplicated: Secondary | ICD-10-CM

## 2012-06-29 DIAGNOSIS — Z1239 Encounter for other screening for malignant neoplasm of breast: Secondary | ICD-10-CM

## 2012-06-29 DIAGNOSIS — I10 Essential (primary) hypertension: Secondary | ICD-10-CM

## 2012-06-29 DIAGNOSIS — Z0000 Encounter for general adult medical examination without abnormal findings: Secondary | ICD-10-CM

## 2012-06-29 DIAGNOSIS — Z01419 Encounter for gynecological examination (general) (routine) without abnormal findings: Secondary | ICD-10-CM | POA: Insufficient documentation

## 2012-06-29 MED ORDER — OXYCODONE-ACETAMINOPHEN 5-325 MG PO TABS: 1.0000 | ORAL_TABLET | ORAL | Status: DC

## 2012-06-29 MED ORDER — TRIAMTERENE-HCTZ 75-50 MG PO TABS: ORAL_TABLET | ORAL | Status: DC

## 2012-06-29 MED ORDER — CLONIDINE HCL 0.1 MG PO TABS: 0.1000 mg | ORAL_TABLET | Freq: Two times a day (BID) | ORAL | Status: DC

## 2012-06-29 NOTE — Assessment & Plan Note (Signed)
Pt still waitng for disability to come through so we can refer to ortho  Refill pain meds

## 2012-06-29 NOTE — Assessment & Plan Note (Signed)
stable °

## 2012-06-29 NOTE — Progress Notes (Signed)
  Subjective:     Heather Walker is a 51 y.o. female and is here for a comprehensive physical exam. The patient reports no new problems--still has no disability.  History   Social History  . Marital Status: Married    Spouse Name: N/A    Number of Children: N/A  . Years of Education: ged   Occupational History  . disabled    Social History Main Topics  . Smoking status: Never Smoker   . Smokeless tobacco: Never Used  . Alcohol Use: No  . Drug Use: No  . Sexually Active: Yes -- Female partner(s)   Other Topics Concern  . Not on file   Social History Narrative  . No narrative on file   Health Maintenance  Topic Date Due  . Influenza Vaccine  07/17/2012  . Pap Smear  02/23/2014  . Tetanus/tdap  02/23/2021    The following portions of the patient's history were reviewed and updated as appropriate: allergies, current medications, past family history, past medical history, past social history, past surgical history and problem list.  Review of Systems Review of Systems  Constitutional: Negative for activity change, appetite change and fatigue.  HENT: Negative for hearing loss, congestion, tinnitus and ear discharge.  dentist due Eyes: Negative for visual disturbance (see optho due)  Respiratory: Negative for cough, chest tightness and shortness of breath.   Cardiovascular: Negative for chest pain, palpitations and leg swelling.  Gastrointestinal: Negative for abdominal pain, diarrhea, constipation and abdominal distention.  Genitourinary: Negative for urgency, frequency, decreased urine volume and difficulty urinating.  Musculoskeletal: walking with cane Skin: Negative for color change, pallor and rash.  Neurological: Negative for dizziness, light-headedness, numbness and headaches.  Hematological: Negative for adenopathy. Does not bruise/bleed easily.  Psychiatric/Behavioral: Negative for suicidal ideas, confusion, sleep disturbance, self-injury, dysphoric mood, decreased  concentration and agitation.       Objective:    BP 160/96  Pulse 81  Temp 98.5 F (36.9 C) (Oral)  Ht 5\' 5"  (1.651 m)  Wt 223 lb 9.6 oz (101.424 kg)  BMI 37.21 kg/m2  SpO2 96% General appearance: alert, cooperative, appears stated age and mild distress Head: Normocephalic, without obvious abnormality, atraumatic Eyes: conjunctivae/corneas clear. PERRL, EOM's intact. Fundi benign. Ears: normal TM's and external ear canals both ears Nose: Nares normal. Septum midline. Mucosa normal. No drainage or sinus tenderness. Throat: lips, mucosa, and tongue normal; teeth and gums normal Neck: no adenopathy, no carotid bruit, no JVD, supple, symmetrical, trachea midline and thyroid not enlarged, symmetric, no tenderness/mass/nodules Back: symmetric, no curvature. ROM normal. No CVA tenderness. Lungs: clear to auscultation bilaterally Breasts: normal appearance, no masses or tenderness Heart: regular rate and rhythm, S1, S2 normal, no murmur, click, rub or gallop Abdomen: soft, non-tender; bowel sounds normal; no masses,  no organomegaly Pelvic: cervix normal in appearance, external genitalia normal, no adnexal masses or tenderness, no cervical motion tenderness, rectovaginal septum normal, uterus normal size, shape, and consistency and vagina normal without discharge Extremities: extremities normal, atraumatic, no cyanosis or edema Pulses: 2+ and symmetric Skin: Skin color, texture, turgor normal. No rashes or lesions Lymph nodes: Cervical, supraclavicular, and axillary nodes normal. Neurologic: 4/5 strength in both legs,  dtr =and b/l psych--  no anxiety, depression    Assessment:    Healthy female exam.      Plan:    ghm utd Check labs See After Visit Summary for Counseling Recommendations

## 2012-06-29 NOTE — Patient Instructions (Addendum)
Preventive Care for Adults, Female A healthy lifestyle and preventive care can promote health and wellness. Preventive health guidelines for women include the following key practices.  A routine yearly physical is a good way to check with your caregiver about your health and preventive screening. It is a chance to share any concerns and updates on your health, and to receive a thorough exam.   Visit your dentist for a routine exam and preventive care every 6 months. Brush your teeth twice a day and floss once a day. Good oral hygiene prevents tooth decay and gum disease.   The frequency of eye exams is based on your age, health, family medical history, use of contact lenses, and other factors. Follow your caregiver's recommendations for frequency of eye exams.   Eat a healthy diet. Foods like vegetables, fruits, whole grains, low-fat dairy products, and lean protein foods contain the nutrients you need without too many calories. Decrease your intake of foods high in solid fats, added sugars, and salt. Eat the right amount of calories for you.Get information about a proper diet from your caregiver, if necessary.   Regular physical exercise is one of the most important things you can do for your health. Most adults should get at least 150 minutes of moderate-intensity exercise (any activity that increases your heart rate and causes you to sweat) each week. In addition, most adults need muscle-strengthening exercises on 2 or more days a week.   Maintain a healthy weight. The body mass index (BMI) is a screening tool to identify possible weight problems. It provides an estimate of body fat based on height and weight. Your caregiver can help determine your BMI, and can help you achieve or maintain a healthy weight.For adults 20 years and older:   A BMI below 18.5 is considered underweight.   A BMI of 18.5 to 24.9 is normal.   A BMI of 25 to 29.9 is considered overweight.   A BMI of 30 and above is  considered obese.   Maintain normal blood lipids and cholesterol levels by exercising and minimizing your intake of saturated fat. Eat a balanced diet with plenty of fruit and vegetables. Blood tests for lipids and cholesterol should begin at age 20 and be repeated every 5 years. If your lipid or cholesterol levels are high, you are over 50, or you are at high risk for heart disease, you may need your cholesterol levels checked more frequently.Ongoing high lipid and cholesterol levels should be treated with medicines if diet and exercise are not effective.   If you smoke, find out from your caregiver how to quit. If you do not use tobacco, do not start.   If you are pregnant, do not drink alcohol. If you are breastfeeding, be very cautious about drinking alcohol. If you are not pregnant and choose to drink alcohol, do not exceed 1 drink per day. One drink is considered to be 12 ounces (355 mL) of beer, 5 ounces (148 mL) of wine, or 1.5 ounces (44 mL) of liquor.   Avoid use of street drugs. Do not share needles with anyone. Ask for help if you need support or instructions about stopping the use of drugs.   High blood pressure causes heart disease and increases the risk of stroke. Your blood pressure should be checked at least every 1 to 2 years. Ongoing high blood pressure should be treated with medicines if weight loss and exercise are not effective.   If you are 55 to 49   years old, ask your caregiver if you should take aspirin to prevent strokes.   Diabetes screening involves taking a blood sample to check your fasting blood sugar level. This should be done once every 3 years, after age 45, if you are within normal weight and without risk factors for diabetes. Testing should be considered at a younger age or be carried out more frequently if you are overweight and have at least 1 risk factor for diabetes.   Breast cancer screening is essential preventive care for women. You should practice "breast  self-awareness." This means understanding the normal appearance and feel of your breasts and may include breast self-examination. Any changes detected, no matter how small, should be reported to a caregiver. Women in their 20s and 30s should have a clinical breast exam (CBE) by a caregiver as part of a regular health exam every 1 to 3 years. After age 40, women should have a CBE every year. Starting at age 40, women should consider having a mammography (breast X-ray test) every year. Women who have a family history of breast cancer should talk to their caregiver about genetic screening. Women at a high risk of breast cancer should talk to their caregivers about having magnetic resonance imaging (MRI) and a mammography every year.   The Pap test is a screening test for cervical cancer. A Pap test can show cell changes on the cervix that might become cervical cancer if left untreated. A Pap test is a procedure in which cells are obtained and examined from the lower end of the uterus (cervix).   Women should have a Pap test starting at age 21.   Between ages 21 and 29, Pap tests should be repeated every 2 years.   Beginning at age 30, you should have a Pap test every 3 years as long as the past 3 Pap tests have been normal.   Some women have medical problems that increase the chance of getting cervical cancer. Talk to your caregiver about these problems. It is especially important to talk to your caregiver if a new problem develops soon after your last Pap test. In these cases, your caregiver may recommend more frequent screening and Pap tests.   The above recommendations are the same for women who have or have not gotten the vaccine for human papillomavirus (HPV).   If you had a hysterectomy for a problem that was not cancer or a condition that could lead to cancer, then you no longer need Pap tests. Even if you no longer need a Pap test, a regular exam is a good idea to make sure no other problems are  starting.   If you are between ages 65 and 70, and you have had normal Pap tests going back 10 years, you no longer need Pap tests. Even if you no longer need a Pap test, a regular exam is a good idea to make sure no other problems are starting.   If you have had past treatment for cervical cancer or a condition that could lead to cancer, you need Pap tests and screening for cancer for at least 20 years after your treatment.   If Pap tests have been discontinued, risk factors (such as a new sexual partner) need to be reassessed to determine if screening should be resumed.   The HPV test is an additional test that may be used for cervical cancer screening. The HPV test looks for the virus that can cause the cell changes on the cervix.   The cells collected during the Pap test can be tested for HPV. The HPV test could be used to screen women aged 30 years and older, and should be used in women of any age who have unclear Pap test results. After the age of 30, women should have HPV testing at the same frequency as a Pap test.   Colorectal cancer can be detected and often prevented. Most routine colorectal cancer screening begins at the age of 50 and continues through age 75. However, your caregiver may recommend screening at an earlier age if you have risk factors for colon cancer. On a yearly basis, your caregiver may provide home test kits to check for hidden blood in the stool. Use of a small camera at the end of a tube, to directly examine the colon (sigmoidoscopy or colonoscopy), can detect the earliest forms of colorectal cancer. Talk to your caregiver about this at age 50, when routine screening begins. Direct examination of the colon should be repeated every 5 to 10 years through age 75, unless early forms of pre-cancerous polyps or small growths are found.   Hepatitis C blood testing is recommended for all people born from 1945 through 1965 and any individual with known risks for hepatitis C.    Practice safe sex. Use condoms and avoid high-risk sexual practices to reduce the spread of sexually transmitted infections (STIs). STIs include gonorrhea, chlamydia, syphilis, trichomonas, herpes, HPV, and human immunodeficiency virus (HIV). Herpes, HIV, and HPV are viral illnesses that have no cure. They can result in disability, cancer, and death. Sexually active women aged 25 and younger should be checked for chlamydia. Older women with new or multiple partners should also be tested for chlamydia. Testing for other STIs is recommended if you are sexually active and at increased risk.   Osteoporosis is a disease in which the bones lose minerals and strength with aging. This can result in serious bone fractures. The risk of osteoporosis can be identified using a bone density scan. Women ages 65 and over and women at risk for fractures or osteoporosis should discuss screening with their caregivers. Ask your caregiver whether you should take a calcium supplement or vitamin D to reduce the rate of osteoporosis.   Menopause can be associated with physical symptoms and risks. Hormone replacement therapy is available to decrease symptoms and risks. You should talk to your caregiver about whether hormone replacement therapy is right for you.   Use sunscreen with sun protection factor (SPF) of 30 or more. Apply sunscreen liberally and repeatedly throughout the day. You should seek shade when your shadow is shorter than you. Protect yourself by wearing long sleeves, pants, a wide-brimmed hat, and sunglasses year round, whenever you are outdoors.   Once a month, do a whole body skin exam, using a mirror to look at the skin on your back. Notify your caregiver of new moles, moles that have irregular borders, moles that are larger than a pencil eraser, or moles that have changed in shape or color.   Stay current with required immunizations.   Influenza. You need a dose every fall (or winter). The composition of  the flu vaccine changes each year, so being vaccinated once is not enough.   Pneumococcal polysaccharide. You need 1 to 2 doses if you smoke cigarettes or if you have certain chronic medical conditions. You need 1 dose at age 65 (or older) if you have never been vaccinated.   Tetanus, diphtheria, pertussis (Tdap, Td). Get 1 dose of   Tdap vaccine if you are younger than age 65, are over 65 and have contact with an infant, are a healthcare worker, are pregnant, or simply want to be protected from whooping cough. After that, you need a Td booster dose every 10 years. Consult your caregiver if you have not had at least 3 tetanus and diphtheria-containing shots sometime in your life or have a deep or dirty wound.   HPV. You need this vaccine if you are a woman age 26 or younger. The vaccine is given in 3 doses over 6 months.   Measles, mumps, rubella (MMR). You need at least 1 dose of MMR if you were born in 1957 or later. You may also need a second dose.   Meningococcal. If you are age 19 to 21 and a first-year college student living in a residence hall, or have one of several medical conditions, you need to get vaccinated against meningococcal disease. You may also need additional booster doses.   Zoster (shingles). If you are age 60 or older, you should get this vaccine.   Varicella (chickenpox). If you have never had chickenpox or you were vaccinated but received only 1 dose, talk to your caregiver to find out if you need this vaccine.   Hepatitis A. You need this vaccine if you have a specific risk factor for hepatitis A virus infection or you simply wish to be protected from this disease. The vaccine is usually given as 2 doses, 6 to 18 months apart.   Hepatitis B. You need this vaccine if you have a specific risk factor for hepatitis B virus infection or you simply wish to be protected from this disease. The vaccine is given in 3 doses, usually over 6 months.  Preventive Services /  Frequency Ages 19 to 39  Blood pressure check.** / Every 1 to 2 years.   Lipid and cholesterol check.** / Every 5 years beginning at age 20.   Clinical breast exam.** / Every 3 years for women in their 20s and 30s.   Pap test.** / Every 2 years from ages 21 through 29. Every 3 years starting at age 30 through age 65 or 70 with a history of 3 consecutive normal Pap tests.   HPV screening.** / Every 3 years from ages 30 through ages 65 to 70 with a history of 3 consecutive normal Pap tests.   Hepatitis C blood test.** / For any individual with known risks for hepatitis C.   Skin self-exam. / Monthly.   Influenza immunization.** / Every year.   Pneumococcal polysaccharide immunization.** / 1 to 2 doses if you smoke cigarettes or if you have certain chronic medical conditions.   Tetanus, diphtheria, pertussis (Tdap, Td) immunization. / A one-time dose of Tdap vaccine. After that, you need a Td booster dose every 10 years.   HPV immunization. / 3 doses over 6 months, if you are 26 and younger.   Measles, mumps, rubella (MMR) immunization. / You need at least 1 dose of MMR if you were born in 1957 or later. You may also need a second dose.   Meningococcal immunization. / 1 dose if you are age 19 to 21 and a first-year college student living in a residence hall, or have one of several medical conditions, you need to get vaccinated against meningococcal disease. You may also need additional booster doses.   Varicella immunization.** / Consult your caregiver.   Hepatitis A immunization.** / Consult your caregiver. 2 doses, 6 to 18 months   apart.   Hepatitis B immunization.** / Consult your caregiver. 3 doses usually over 6 months.  Ages 40 to 64  Blood pressure check.** / Every 1 to 2 years.   Lipid and cholesterol check.** / Every 5 years beginning at age 20.   Clinical breast exam.** / Every year after age 40.   Mammogram.** / Every year beginning at age 40 and continuing for as  long as you are in good health. Consult with your caregiver.   Pap test.** / Every 3 years starting at age 30 through age 65 or 70 with a history of 3 consecutive normal Pap tests.   HPV screening.** / Every 3 years from ages 30 through ages 65 to 70 with a history of 3 consecutive normal Pap tests.   Fecal occult blood test (FOBT) of stool. / Every year beginning at age 50 and continuing until age 75. You may not need to do this test if you get a colonoscopy every 10 years.   Flexible sigmoidoscopy or colonoscopy.** / Every 5 years for a flexible sigmoidoscopy or every 10 years for a colonoscopy beginning at age 50 and continuing until age 75.   Hepatitis C blood test.** / For all people born from 1945 through 1965 and any individual with known risks for hepatitis C.   Skin self-exam. / Monthly.   Influenza immunization.** / Every year.   Pneumococcal polysaccharide immunization.** / 1 to 2 doses if you smoke cigarettes or if you have certain chronic medical conditions.   Tetanus, diphtheria, pertussis (Tdap, Td) immunization.** / A one-time dose of Tdap vaccine. After that, you need a Td booster dose every 10 years.   Measles, mumps, rubella (MMR) immunization. / You need at least 1 dose of MMR if you were born in 1957 or later. You may also need a second dose.   Varicella immunization.** / Consult your caregiver.   Meningococcal immunization.** / Consult your caregiver.   Hepatitis A immunization.** / Consult your caregiver. 2 doses, 6 to 18 months apart.   Hepatitis B immunization.** / Consult your caregiver. 3 doses, usually over 6 months.  Ages 65 and over  Blood pressure check.** / Every 1 to 2 years.   Lipid and cholesterol check.** / Every 5 years beginning at age 20.   Clinical breast exam.** / Every year after age 40.   Mammogram.** / Every year beginning at age 40 and continuing for as long as you are in good health. Consult with your caregiver.   Pap test.** /  Every 3 years starting at age 30 through age 65 or 70 with a 3 consecutive normal Pap tests. Testing can be stopped between 65 and 70 with 3 consecutive normal Pap tests and no abnormal Pap or HPV tests in the past 10 years.   HPV screening.** / Every 3 years from ages 30 through ages 65 or 70 with a history of 3 consecutive normal Pap tests. Testing can be stopped between 65 and 70 with 3 consecutive normal Pap tests and no abnormal Pap or HPV tests in the past 10 years.   Fecal occult blood test (FOBT) of stool. / Every year beginning at age 50 and continuing until age 75. You may not need to do this test if you get a colonoscopy every 10 years.   Flexible sigmoidoscopy or colonoscopy.** / Every 5 years for a flexible sigmoidoscopy or every 10 years for a colonoscopy beginning at age 50 and continuing until age 75.   Hepatitis   C blood test.** / For all people born from 1945 through 1965 and any individual with known risks for hepatitis C.   Osteoporosis screening.** / A one-time screening for women ages 65 and over and women at risk for fractures or osteoporosis.   Skin self-exam. / Monthly.   Influenza immunization.** / Every year.   Pneumococcal polysaccharide immunization.** / 1 dose at age 65 (or older) if you have never been vaccinated.   Tetanus, diphtheria, pertussis (Tdap, Td) immunization. / A one-time dose of Tdap vaccine if you are over 65 and have contact with an infant, are a healthcare worker, or simply want to be protected from whooping cough. After that, you need a Td booster dose every 10 years.   Varicella immunization.** / Consult your caregiver.   Meningococcal immunization.** / Consult your caregiver.   Hepatitis A immunization.** / Consult your caregiver. 2 doses, 6 to 18 months apart.   Hepatitis B immunization.** / Check with your caregiver. 3 doses, usually over 6 months.  ** Family history and personal history of risk and conditions may change your caregiver's  recommendations. Document Released: 11/29/2001 Document Revised: 09/22/2011 Document Reviewed: 02/28/2011 [employer] Patient Information 2012 [employer]. 

## 2012-06-29 NOTE — Assessment & Plan Note (Signed)
Refill meds rto 2-3 weeks to check bp

## 2012-07-03 ENCOUNTER — Other Ambulatory Visit: Payer: Self-pay

## 2012-07-03 DIAGNOSIS — N6310 Unspecified lump in the right breast, unspecified quadrant: Secondary | ICD-10-CM

## 2012-07-04 ENCOUNTER — Telehealth: Payer: Self-pay | Admitting: Family Medicine

## 2012-07-04 NOTE — Telephone Encounter (Signed)
LM ON TRIAGE LINE 336pm please call with test results  cb # left is incomplete  [phone]???

## 2012-07-04 NOTE — Telephone Encounter (Signed)
msg left to call the office at the number listed in the chart.       KP

## 2012-07-06 NOTE — Telephone Encounter (Signed)
Discussed with patient and labs were not done, she is scheduled for 07/20/12.      KP

## 2012-07-06 NOTE — Telephone Encounter (Signed)
Pt returned your call. Call back # [phone]

## 2012-07-17 ENCOUNTER — Encounter: Payer: Self-pay | Admitting: Family Medicine

## 2012-07-20 ENCOUNTER — Encounter: Payer: Self-pay | Admitting: Family Medicine

## 2012-07-20 ENCOUNTER — Ambulatory Visit (INDEPENDENT_AMBULATORY_CARE_PROVIDER_SITE_OTHER): Payer: Self-pay | Admitting: Family Medicine

## 2012-07-20 VITALS — BP 134/86 | HR 71 | Temp 98.4°F | Wt 227.4 lb

## 2012-07-20 DIAGNOSIS — I10 Essential (primary) hypertension: Secondary | ICD-10-CM

## 2012-07-20 DIAGNOSIS — Z0000 Encounter for general adult medical examination without abnormal findings: Secondary | ICD-10-CM

## 2012-07-20 DIAGNOSIS — M549 Dorsalgia, unspecified: Secondary | ICD-10-CM

## 2012-07-20 LAB — CBC WITH DIFFERENTIAL/PLATELET
Basophils Absolute: 0 10*3/uL (ref 0.0–0.1)
Basophils Relative: 0.5 % (ref 0.0–3.0)
Eosinophils Absolute: 0.1 10*3/uL (ref 0.0–0.7)
Eosinophils Relative: 2.7 % (ref 0.0–5.0)
HCT: 38.3 % (ref 36.0–46.0)
Hemoglobin: 12.6 g/dL (ref 12.0–15.0)
Lymphocytes Relative: 57.8 % — ABNORMAL HIGH (ref 12.0–46.0)
Lymphs Abs: 2.3 10*3/uL (ref 0.7–4.0)
MCHC: 33 g/dL (ref 30.0–36.0)
MCV: 85.5 fl (ref 78.0–100.0)
Monocytes Absolute: 0.4 10*3/uL (ref 0.1–1.0)
Monocytes Relative: 10 % (ref 3.0–12.0)
Neutro Abs: 1.2 10*3/uL — ABNORMAL LOW (ref 1.4–7.7)
Neutrophils Relative %: 29 % — ABNORMAL LOW (ref 43.0–77.0)
Platelets: 216 10*3/uL (ref 150.0–400.0)
RBC: 4.48 Mil/uL (ref 3.87–5.11)
RDW: 15.1 % — ABNORMAL HIGH (ref 11.5–14.6)
WBC: 4 10*3/uL — ABNORMAL LOW (ref 4.5–10.5)

## 2012-07-20 LAB — BASIC METABOLIC PANEL
BUN: 11 mg/dL (ref 6–23)
CO2: 29 mEq/L (ref 19–32)
Calcium: 8.8 mg/dL (ref 8.4–10.5)
Chloride: 99 mEq/L (ref 96–112)
Creatinine, Ser: 0.9 mg/dL (ref 0.4–1.2)
GFR: 81.15 mL/min (ref >60.00)
Glucose, Bld: 158 mg/dL — ABNORMAL HIGH (ref 70–99)
Potassium: 3.1 mEq/L — ABNORMAL LOW (ref 3.5–5.1)
Sodium: 136 mEq/L (ref 135–145)

## 2012-07-20 LAB — LIPID PANEL
Cholesterol: 174 mg/dL (ref 0–200)
HDL: 50.4 mg/dL (ref >39.00)
LDL Cholesterol: 96 mg/dL (ref 0–99)
Total CHOL/HDL Ratio: 3
Triglycerides: 136 mg/dL (ref 0.0–149.0)
VLDL: 27.2 mg/dL (ref 0.0–40.0)

## 2012-07-20 LAB — HEPATIC FUNCTION PANEL
ALT: 28 U/L (ref 0–35)
AST: 29 U/L (ref 0–37)
Albumin: 3.6 g/dL (ref 3.5–5.2)
Alkaline Phosphatase: 94 U/L (ref 39–117)
Bilirubin, Direct: 0.1 mg/dL (ref 0.0–0.3)
Total Bilirubin: 0.6 mg/dL (ref 0.3–1.2)
Total Protein: 7.2 g/dL (ref 6.0–8.3)

## 2012-07-20 LAB — POCT URINALYSIS DIPSTICK
Bilirubin, UA: NEGATIVE
Blood, UA: NEGATIVE
Glucose, UA: NEGATIVE
Ketones, UA: NEGATIVE
Nitrite, UA: NEGATIVE
Protein, UA: NEGATIVE
Spec Grav, UA: 1.01
Urobilinogen, UA: 0.2
pH, UA: 6.5

## 2012-07-20 LAB — TSH: TSH: 3.87 u[IU]/mL (ref 0.35–5.50)

## 2012-07-20 MED ORDER — TRIAMTERENE-HCTZ 75-50 MG PO TABS: ORAL_TABLET | ORAL | Status: DC

## 2012-07-20 NOTE — Addendum Note (Signed)
Addended by: Arnette Norris on: 07/20/2012 11:47 AM   Modules accepted: Orders

## 2012-07-20 NOTE — Addendum Note (Signed)
Addended by: Silvio Pate D on: 07/20/2012 12:00 PM   Modules accepted: Orders

## 2012-07-20 NOTE — Patient Instructions (Signed)

## 2012-07-20 NOTE — Addendum Note (Signed)
Addended by: Silvio Pate D on: 07/20/2012 11:59 AM   Modules accepted: Orders

## 2012-07-20 NOTE — Progress Notes (Signed)
  Subjective:    Patient here for follow-up of elevated blood pressure.  She is not exercising and is adherent to a low-salt diet.  Blood pressure is not well controlled at home. Cardiac symptoms: fatigue. Patient denies: chest pain, chest pressure/discomfort, claudication, dyspnea, exertional chest pressure/discomfort, irregular heart beat, lower extremity edema, near-syncope, orthopnea, palpitations, paroxysmal nocturnal dyspnea, syncope and tachypnea. Cardiovascular risk factors: hypertension, obesity (BMI >= 30 kg/m2) and sedentary lifestyle. Use of agents associated with hypertension: none. History of target organ damage: none. Pt struggling with pain today and is tired from pain med.  Pt states pharmacy did not change med to 1 tab so she is still taking maxzide 1/2 tab.   The following portions of the patient's history were reviewed and updated as appropriate: allergies, current medications, past family history, past medical history, past social history, past surgical history and problem list.  Review of Systems Pertinent items are noted in HPI.     Objective:    BP 134/86  Pulse 71  Temp 98.4 F (36.9 C) (Oral)  Wt 227 lb 6.4 oz (103.148 kg)  SpO2 96% General appearance: alert, cooperative, appears stated age and no distress Lungs: clear to auscultation bilaterally Heart: S1, S2 normal Extremities: extremities normal, atraumatic, no cyanosis or edema    Assessment:    Hypertension, stage 1 . Evidence of target organ damage: none.   back pain and urinary frequency-- urine sent for culture Plan:    Medication: increase to maxzide 1 tablet. Dietary sodium restriction. Regular aerobic exercise. Check blood pressures 2-3 times weekly and record. Follow up: 3 months and as needed.

## 2012-07-21 LAB — URINALYSIS
Bilirubin Urine: NEGATIVE
Glucose, UA: NEGATIVE mg/dL
Hgb urine dipstick: NEGATIVE
Ketones, ur: NEGATIVE mg/dL
Leukocytes, UA: NEGATIVE
Nitrite: NEGATIVE
Protein, ur: NEGATIVE mg/dL
Specific Gravity, Urine: 1.012 (ref 1.005–1.030)
Urobilinogen, UA: 0.2 mg/dL (ref 0.0–1.0)
pH: 6.5 (ref 5.0–8.0)

## 2012-07-22 LAB — URINE CULTURE: Colony Count: 15000

## 2012-07-24 ENCOUNTER — Other Ambulatory Visit: Payer: Self-pay | Admitting: Family Medicine

## 2012-08-06 MED ORDER — POTASSIUM CHLORIDE CRYS ER 20 MEQ PO TBCR: 20.0000 meq | EXTENDED_RELEASE_TABLET | Freq: Every day | ORAL | Status: DC

## 2012-08-09 ENCOUNTER — Telehealth: Payer: Self-pay

## 2012-08-09 NOTE — Telephone Encounter (Signed)
Denied swelling, fever and drainage, she is only having the right breast pain. Apt scheduled with Dr.Lowne 08/10/12 at 3:15. Time is per patient request      KP

## 2012-08-09 NOTE — Telephone Encounter (Signed)
Caller: [name]/Patient; Patient Name: Heather Walker; PCP: Lelon Perla.; Best Callback Phone Number: [phone].  Pt calling today 08/09/12 regarding Dr. Laury Axon referred her to breast center for a regular mamogram, pt does not have insurance and is waiting on breast center to send her some forms to fill out before they can see her.  Pt calling today because she is having problem with right breast.  She had a mamogram in 2008 and had a stent put in right breast. (Pt does not know what the stent is for.  Stent is still in place).  Now having pain in the area where she had the stent placed.  Onset x2 weeks but seems to be getting worse.  Afebrile.  Emergent symptoms r/o by Breast Symptoms Female guidelines with exception of breast tender and warm to touch.  (See Provider Within 72 Hours).  Dr. Laury Axon is out of town today and she is supposed to be the doc of the afternoon on Thursdays.  OFFICE PLEASE CALL PT BACK AT [phone] TO LET HER KNOW IF SHE CAN SEE ANOTHER DOCTOR TODAY OR SCHEDULE APPT FOR HER FOR TOMORROW WITH DR. Laury Axon.  [facility] unable to schedule next day appt needs.

## 2012-08-09 NOTE — Telephone Encounter (Signed)
Can see any provider available- including me tomorrow as doc of the day

## 2012-08-09 NOTE — Telephone Encounter (Signed)
Pt left message on triage line stating plz call back need to talk to someone concerning medical problem. I called pt back on number provided which was her work and left message to call me back so I can help her.   MW

## 2012-08-10 ENCOUNTER — Ambulatory Visit (INDEPENDENT_AMBULATORY_CARE_PROVIDER_SITE_OTHER): Payer: Self-pay | Admitting: Family Medicine

## 2012-08-10 ENCOUNTER — Encounter: Payer: Self-pay | Admitting: Family Medicine

## 2012-08-10 VITALS — BP 144/86 | HR 79 | Temp 98.5°F | Wt 227.8 lb

## 2012-08-10 DIAGNOSIS — N644 Mastodynia: Secondary | ICD-10-CM | POA: Insufficient documentation

## 2012-08-10 NOTE — Assessment & Plan Note (Signed)
No abnormality palpated Diagnostic mammogram ordered

## 2012-08-10 NOTE — Progress Notes (Signed)
  Subjective:    Patient ID: Heather Walker, female    DOB: [birth year], 51 y.o.   MRN: [id number]  HPI Pt here c/o R breast pain --in the area she says she had a "stent" put in her breast several years ago.  Pt has a diagnostic mammogram pending but is waiting for paperwork to be approved because of her financial situation.   Review of Systems     Objective:   Physical Exam  Constitutional: She is oriented to person, place, and time.  Genitourinary: There is breast tenderness. No breast swelling, discharge or bleeding.  Neurological: She is alert and oriented to person, place, and time.   Breast tender about 9 o'clock R breast       Assessment & Plan:

## 2012-08-20 ENCOUNTER — Encounter (HOSPITAL_COMMUNITY): Payer: Self-pay

## 2012-08-20 ENCOUNTER — Other Ambulatory Visit: Payer: Self-pay | Admitting: Obstetrics and Gynecology

## 2012-08-20 ENCOUNTER — Ambulatory Visit (HOSPITAL_COMMUNITY)
Admission: RE | Admit: 2012-08-20 | Discharge: 2012-08-20 | Disposition: A | Discharge status: Home / Self Care | Payer: Self-pay | Source: Ambulatory Visit | Attending: Obstetrics and Gynecology | Admitting: Obstetrics and Gynecology

## 2012-08-20 VITALS — BP 196/132 | Temp 98.6°F | Ht 65.0 in | Wt 227.4 lb

## 2012-08-20 DIAGNOSIS — N644 Mastodynia: Secondary | ICD-10-CM

## 2012-08-20 DIAGNOSIS — Z1239 Encounter for other screening for malignant neoplasm of breast: Secondary | ICD-10-CM

## 2012-08-20 HISTORY — DX: Cardiac murmur, unspecified: R01.1

## 2012-08-20 NOTE — Patient Instructions (Signed)
Taught patient how to perform BSE and gave educational materials to take home. Patient did not need a Pap smear today due to last Pap smear was 06/29/12 and patient has a history of a hysterectomy for benign reasons. Let patient know that she will not need any further Pap smears since has a history of a hysterectomy for benign reasons. [facility] to [facility] for bilateral diagnostic mammogram and possible right breast ultrasound. Appointment scheduled for Monday, August 27, 2012 at 1415 Patient aware of appointment and will be there. Talked with patient about her elevated BP. Patient stated she has not taken her BP medications this morning. Told patient she needs to take her medications and call her physician to follow up. Patient verbalized understanding.

## 2012-08-20 NOTE — Progress Notes (Signed)
Complaints of right breast pain x 2 months where breast clip was previously placed in 2008. Last screening mammogram completed at the [facility] 07/03/07 and diagnostic mammogram follow up for right breast 09/18/07. Right core needle biopsy was completed 09/18/07 with clip placement.  Pap Smear:    Pap smear not performed today. Last Pap smear was 06/29/2012 at [facility] [place] and normal. Per patient she has no history of abnormal Pap smears. Patient has a history of a hysterectomy 12/06/2007 for fibroids. Pap smear result above is in EPIC.  Physical exam: Breasts Breasts symmetrical. No skin abnormalities left breast. Small darkened area on the right lower breast around 7 o'clock. No nipple retraction bilateral breasts. No nipple discharge bilateral breasts. No lymphadenopathy. No lumps palpated left breast. Palpated two small lumps in the right axilla where patient states she has had boyle's removed in both places. Palpated a small lump in the right breast around 7 o'clock next to a small darkened area on the breast. Patient complained of tenderness when palpated lower right breast. Patient referred to the [facility] for bilateral diagnostic mammogram and possible right breast ultrasound. Appointment scheduled for Monday, August 27, 2012 at 1415.        Pelvic/Bimanual No Pap smear completed today since last Pap smear was 06/29/2012 and normal. Pap smear not indicated per BCCCP guidelines.

## 2012-08-27 ENCOUNTER — Ambulatory Visit
Admission: RE | Admit: 2012-08-27 | Discharge: 2012-08-27 | Disposition: A | Discharge status: Home / Self Care | Payer: No Typology Code available for payment source | Source: Ambulatory Visit | Attending: Obstetrics and Gynecology | Admitting: Obstetrics and Gynecology

## 2012-08-27 DIAGNOSIS — N644 Mastodynia: Secondary | ICD-10-CM

## 2012-10-18 NOTE — Addendum Note (Signed)
Encounter addended by: Lakisha Peyser Poll Tyronza Happe, RN on: 10/18/2012  4:05 PM<BR>     Documentation filed: Visit Diagnoses, Charges VN

## 2012-11-09 ENCOUNTER — Encounter (HOSPITAL_COMMUNITY): Payer: Self-pay | Admitting: Emergency Medicine

## 2012-11-09 ENCOUNTER — Emergency Department (HOSPITAL_COMMUNITY)
Admission: EM | Admit: 2012-11-09 | Discharge: 2012-11-09 | Disposition: A | Discharge status: Home / Self Care | Payer: Medicaid Other | Attending: Emergency Medicine | Admitting: Emergency Medicine

## 2012-11-09 ENCOUNTER — Emergency Department (HOSPITAL_COMMUNITY): Payer: Medicaid Other

## 2012-11-09 DIAGNOSIS — R0602 Shortness of breath: Secondary | ICD-10-CM | POA: Insufficient documentation

## 2012-11-09 DIAGNOSIS — R079 Chest pain, unspecified: Secondary | ICD-10-CM | POA: Insufficient documentation

## 2012-11-09 DIAGNOSIS — R011 Cardiac murmur, unspecified: Secondary | ICD-10-CM | POA: Insufficient documentation

## 2012-11-09 DIAGNOSIS — M545 Low back pain, unspecified: Secondary | ICD-10-CM | POA: Insufficient documentation

## 2012-11-09 DIAGNOSIS — I10 Essential (primary) hypertension: Secondary | ICD-10-CM | POA: Insufficient documentation

## 2012-11-09 DIAGNOSIS — Z79899 Other long term (current) drug therapy: Secondary | ICD-10-CM | POA: Insufficient documentation

## 2012-11-09 DIAGNOSIS — Z8659 Personal history of other mental and behavioral disorders: Secondary | ICD-10-CM | POA: Insufficient documentation

## 2012-11-09 DIAGNOSIS — M549 Dorsalgia, unspecified: Secondary | ICD-10-CM

## 2012-11-09 DIAGNOSIS — G8929 Other chronic pain: Secondary | ICD-10-CM | POA: Insufficient documentation

## 2012-11-09 DIAGNOSIS — M129 Arthropathy, unspecified: Secondary | ICD-10-CM | POA: Insufficient documentation

## 2012-11-09 DIAGNOSIS — J45909 Unspecified asthma, uncomplicated: Secondary | ICD-10-CM | POA: Insufficient documentation

## 2012-11-09 LAB — BASIC METABOLIC PANEL
BUN: 5 mg/dL — ABNORMAL LOW (ref 6–23)
CO2: 31 mEq/L (ref 19–32)
Calcium: 8.6 mg/dL (ref 8.4–10.5)
Chloride: 100 mEq/L (ref 96–112)
Creatinine, Ser: 0.63 mg/dL (ref 0.50–1.10)
GFR calc Af Amer: >90 mL/min (ref >90)
GFR calc non Af Amer: >90 mL/min (ref >90)
Glucose, Bld: 163 mg/dL — ABNORMAL HIGH (ref 70–99)
Potassium: 3 mEq/L — ABNORMAL LOW (ref 3.5–5.1)
Sodium: 141 mEq/L (ref 135–145)

## 2012-11-09 LAB — CBC
HCT: 38.6 % (ref 36.0–46.0)
Hemoglobin: 12.7 g/dL (ref 12.0–15.0)
MCH: 27.9 pg (ref 26.0–34.0)
MCHC: 32.9 g/dL (ref 30.0–36.0)
MCV: 84.8 fL (ref 78.0–100.0)
Platelets: 264 10*3/uL (ref 150–400)
RBC: 4.55 MIL/uL (ref 3.87–5.11)
RDW: 14.4 % (ref 11.5–15.5)
WBC: 6 10*3/uL (ref 4.0–10.5)

## 2012-11-09 LAB — POCT I-STAT TROPONIN I: Troponin i, poc: 0 ng/mL (ref 0.00–0.08)

## 2012-11-09 MED ORDER — HYDROMORPHONE HCL PF 1 MG/ML IJ SOLN
1.0000 mg | Freq: Once | INTRAMUSCULAR | Status: AC
  Administered 2012-11-09: 1 mg via INTRAVENOUS
  Filled 2012-11-09: qty 1

## 2012-11-09 MED ORDER — KETOROLAC TROMETHAMINE 30 MG/ML IJ SOLN
30.0000 mg | Freq: Once | INTRAMUSCULAR | Status: AC
  Administered 2012-11-09: 30 mg via INTRAVENOUS
  Filled 2012-11-09: qty 1

## 2012-11-09 MED ORDER — ONDANSETRON HCL 4 MG/2ML IJ SOLN
4.0000 mg | Freq: Once | INTRAMUSCULAR | Status: AC
  Administered 2012-11-09: 4 mg via INTRAVENOUS
  Filled 2012-11-09: qty 2

## 2012-11-09 MED ORDER — SODIUM CHLORIDE 0.9 % IV SOLN
Freq: Once | INTRAVENOUS | Status: AC
  Administered 2012-11-09: 20 mL/h via INTRAVENOUS

## 2012-11-09 MED ORDER — POTASSIUM CHLORIDE CRYS ER 20 MEQ PO TBCR
40.0000 meq | EXTENDED_RELEASE_TABLET | Freq: Once | ORAL | Status: AC
  Administered 2012-11-09: 40 meq via ORAL
  Filled 2012-11-09: qty 2

## 2012-11-09 MED ORDER — CYCLOBENZAPRINE HCL 10 MG PO TABS: 10.0000 mg | ORAL_TABLET | Freq: Two times a day (BID) | ORAL | Status: DC | PRN

## 2012-11-09 MED ORDER — OXYCODONE-ACETAMINOPHEN 5-325 MG PO TABS: 2.0000 | ORAL_TABLET | ORAL | Status: DC | PRN

## 2012-11-09 NOTE — ED Notes (Signed)
JXB:[id number]<GN> Expected date:11/09/12<BR> Expected time:10:36 AM<BR> Means of arrival:Ambulance<BR> Comments:<BR> Back pain

## 2012-11-09 NOTE — ED Notes (Signed)
Per EMS, pt reports lower back pain for several days, h/o arthritis in back.  Denies acute injury.

## 2012-11-09 NOTE — ED Notes (Signed)
Pt reports to PA student that she has had chest pain. Pt reports to this RN that she had a brief episode of sharp chest pain last night. Denies chest pain at this time, denies SOB, nausea or additional sx other than low back pain.

## 2012-11-09 NOTE — ED Notes (Signed)
Pt c/o chronic low back pain.  Pain score 10/10.  Denies injury, numbness and tingling.    Sts several sharp pains on L side of chest, last night.  Denies current chest pain.  Sts this has happened before, she was "checked out, but they didn't find anything."

## 2012-11-09 NOTE — ED Provider Notes (Addendum)
History     CSN: [id number]  Arrival date & time 11/09/12  1043   First MD Initiated Contact with Patient 11/09/12 1051      Chief Complaint  Patient presents with  . Back Pain    (Consider location/radiation/quality/duration/timing/severity/associated sxs/prior treatment) HPI....sharp lower back pain for several days. No radiation. No chest pain, dyspnea, diaphoresis, hypotension, tachycardia.  Patient has chronic low back pain. severity is moderate.  Patient is on Maxzide and Catapres for blood pressure.  No bowel or bladder incontinence.  Past Medical History  Diagnosis Date  . Hypertension   . Depression   . Asthma   . Arthritis   . Heart murmur     Past Surgical History  Procedure Date  . Abdominal hysterectomy   . Bladder suspension   . Appendectomy   . Cesarean section     3 previous  . Tubal ligation     Family History  Problem Relation Age of Onset  . Hypertension Mother   . Asthma Mother   . Hypertension Sister   . Asthma Sister   . Hypertension Maternal Grandmother   . Heart defect Sister   . Asthma Sister   . Aneurysm Sister   . Asthma Sister     History  Substance Use Topics  . Smoking status: Never Smoker   . Smokeless tobacco: Never Used  . Alcohol Use: No    OB History    Grav Para Term Preterm Abortions TAB SAB Ect Mult Living   3 3 3       3       Review of Systems  All other systems reviewed and are negative.    Allergies  Codeine  Home Medications   Current Outpatient Rx  Name  Route  Sig  Dispense  Refill  . ALBUTEROL SULFATE HFA 108 (90 BASE) MCG/ACT IN AERS   Inhalation   Inhale 2 puffs into the lungs every 6 (six) hours as needed. For shortness of breath.         . CLONIDINE HCL 0.1 MG PO TABS   Oral   Take 1 tablet (0.1 mg total) by mouth 2 (two) times daily.   60 tablet   5   . IBUPROFEN 200 MG PO TABS   Oral   Take 400 mg by mouth every 8 (eight) hours as needed. For pain.         .  OXYCODONE-ACETAMINOPHEN 5-325 MG PO TABS   Oral   Take 1-2 tablets by mouth every 6 (six) hours as needed. For pain.         . TRIAMTERENE-HCTZ 75-50 MG PO TABS   Oral   Take 1 tablet by mouth daily.           BP 184/100  Temp 98 F (36.7 C) (Oral)  Resp 20  SpO2 98%  Physical Exam  Nursing note and vitals reviewed. Constitutional: She is oriented to person, place, and time.       obese  HENT:  Head: Normocephalic and atraumatic.  Eyes: Conjunctivae normal and EOM are normal. Pupils are equal, round, and reactive to light.  Neck: Normal range of motion. Neck supple.  Cardiovascular: Normal rate, regular rhythm and normal heart sounds.   Pulmonary/Chest: Effort normal and breath sounds normal.  Abdominal: Soft. Bowel sounds are normal.  Musculoskeletal:        tender lower spine.  Pain with straight leg raise bilaterally  Neurological: She is alert and oriented to person, place,  and time.  Skin: Skin is warm and dry.  Psychiatric: She has a normal mood and affect.    ED Course  Procedures (including critical care time)  Labs Reviewed  BASIC METABOLIC PANEL - Abnormal; Notable for the following:    Potassium 3.0 (*)     Glucose, Bld 163 (*)     BUN 5 (*)     All other components within normal limits  CBC  POCT I-STAT TROPONIN I   Dg Chest 2 View  11/09/2012  *RADIOLOGY REPORT*  Clinical Data: Chest pain.  CHEST - 2 VIEW  Comparison: 10/07/2009.  Findings: The cardiac silhouette, mediastinal and hilar contours are within normal limits and stable.  There is tortuosity and mild ectasia of the thoracic aorta.  The lungs are clear.  No pleural effusion.  The bony thorax is intact.  Stable degenerative changes involving the thoracic spine with probable changes of DISH.  IMPRESSION: No acute cardiopulmonary findings.   Original Report Authenticated By: Rudie Meyer, M.D.      No diagnosis found.   Date: 11/09/2012  Rate: 61  Rhythm: normal sinus rhythm  QRS Axis:  left  Intervals: normal  ST/T Wave abnormalities: normal  Conduction Disutrbances:none  Narrative Interpretation:   Old EKG Reviewed: changes noted   MDM  Patient feeling better after IV analgesics.   Discharge on Percocet#20 and Flexeril 10 mg#20        Donnetta Hutching, MD 11/09/12 1426  Donnetta Hutching, MD 11/09/12 1427  Donnetta Hutching, MD 11/09/12 [phone]

## 2012-11-09 NOTE — ED Provider Notes (Signed)
MSE was initiated and I personally evaluated the patient and placed orders (if any) at  11:11 AM on November 09, 2012. 52 y/o female with chronic back pain presenting with back pain and left sided chest pain beginning last night with associated shortness of breath. Orders placed. Patient moved from FT to acute side of ED. On exam no chest wall tenderness. Heart RRR. Lungs CTA. She is in NAD. The patient appears stable so that the remainder of the MSE may be completed by another provider.  Trevor Mace, PA-C 11/09/12 1113

## 2012-11-13 ENCOUNTER — Encounter: Payer: Self-pay | Admitting: Family Medicine

## 2012-11-13 ENCOUNTER — Ambulatory Visit (INDEPENDENT_AMBULATORY_CARE_PROVIDER_SITE_OTHER): Payer: Medicare Other | Admitting: Family Medicine

## 2012-11-13 VITALS — BP 126/82 | HR 85 | Temp 98.2°F | Wt 226.4 lb

## 2012-11-13 DIAGNOSIS — M545 Low back pain, unspecified: Secondary | ICD-10-CM

## 2012-11-13 DIAGNOSIS — IMO0002 Reserved for concepts with insufficient information to code with codable children: Secondary | ICD-10-CM | POA: Diagnosis not present

## 2012-11-13 DIAGNOSIS — L0291 Cutaneous abscess, unspecified: Secondary | ICD-10-CM

## 2012-11-13 DIAGNOSIS — M79604 Pain in right leg: Secondary | ICD-10-CM

## 2012-11-13 DIAGNOSIS — L0390 Cellulitis, unspecified: Secondary | ICD-10-CM

## 2012-11-13 MED ORDER — ALPRAZOLAM 0.5 MG PO TABS: ORAL_TABLET | ORAL | Status: DC

## 2012-11-13 MED ORDER — DOXYCYCLINE HYCLATE 100 MG PO TABS: 100.0000 mg | ORAL_TABLET | Freq: Two times a day (BID) | ORAL | Status: DC

## 2012-11-13 NOTE — Patient Instructions (Signed)
Back Pain, Adult Low back pain is very common. About 1 in 5 people have back pain.The cause of low back pain is rarely dangerous. The pain often gets better over time.About half of people with a sudden onset of back pain feel better in just 2 weeks. About 8 in 10 people feel better by 6 weeks.  CAUSES Some common causes of back pain include:  Strain of the muscles or ligaments supporting the spine.  Wear and tear (degeneration) of the spinal discs.  Arthritis.  Direct injury to the back. DIAGNOSIS Most of the time, the direct cause of low back pain is not known.However, back pain can be treated effectively even when the exact cause of the pain is unknown.Answering your caregiver's questions about your overall health and symptoms is one of the most accurate ways to make sure the cause of your pain is not dangerous. If your caregiver needs more information, he or she may order lab work or imaging tests (X-rays or MRIs).However, even if imaging tests show changes in your back, this usually does not require surgery. HOME CARE INSTRUCTIONS For many people, back pain returns.Since low back pain is rarely dangerous, it is often a condition that people can learn to manageon their own.   Remain active. It is stressful on the back to sit or stand in one place. Do not sit, drive, or stand in one place for more than 30 minutes at a time. Take short walks on level surfaces as soon as pain allows.Try to increase the length of time you walk each day.  Do not stay in bed.Resting more than 1 or 2 days can delay your recovery.  Do not avoid exercise or work.Your body is made to move.It is not dangerous to be active, even though your back may hurt.Your back will likely heal faster if you return to being active before your pain is gone.  Pay attention to your body when you bend and lift. Many people have less discomfortwhen lifting if they bend their knees, keep the load close to their bodies,and  avoid twisting. Often, the most comfortable positions are those that put less stress on your recovering back.  Find a comfortable position to sleep. Use a firm mattress and lie on your side with your knees slightly bent. If you lie on your back, put a pillow under your knees.  Only take over-the-counter or prescription medicines as directed by your caregiver. Over-the-counter medicines to reduce pain and inflammation are often the most helpful.Your caregiver may prescribe muscle relaxant drugs.These medicines help dull your pain so you can more quickly return to your normal activities and healthy exercise.  Put ice on the injured area.  Put ice in a plastic bag.  Place a towel between your skin and the bag.  Leave the ice on for 15 to 20 minutes, 3 to 4 times a day for the first 2 to 3 days. After that, ice and heat may be alternated to reduce pain and spasms.  Ask your caregiver about trying back exercises and gentle massage. This may be of some benefit.  Avoid feeling anxious or stressed.Stress increases muscle tension and can worsen back pain.It is important to recognize when you are anxious or stressed and learn ways to manage it.Exercise is a great option. SEEK MEDICAL CARE IF:  You have pain that is not relieved with rest or medicine.  You have pain that does not improve in 1 week.  You have new symptoms.  You are generally   not feeling well. SEEK IMMEDIATE MEDICAL CARE IF:   You have pain that radiates from your back into your legs.  You develop new bowel or bladder control problems.  You have unusual weakness or numbness in your arms or legs.  You develop nausea or vomiting.  You develop abdominal pain.  You feel faint. Document Released: 10/03/2005 Document Revised: 04/03/2012 Document Reviewed: 02/21/2011 Exit[employer] Patient Information 2013 [employer].  

## 2012-11-13 NOTE — Assessment & Plan Note (Signed)
Gait is getting progressively worse Pt very unsteady  Pt needs MRI and most likely Neurosurgical referral but she is on [place] access.  She does not want to go to Dr on her card.   Pt will try to change to regular medicaid and we will call hospital to see what we can do to get MRI done.

## 2012-11-13 NOTE — Progress Notes (Signed)
  Subjective:    Patient ID: Heather Walker, female    DOB: Aug 06, 1961, 52 y.o.   MRN: [id number]  HPI Pt here to f/u back pain and have papers filled out.  Pt is now unable to stand for more than 30 -60 seconds at a time and is very unsteady on her feet.     Review of Systems As above    Objective:   Physical Exam  BP 126/82  Pulse 85  Temp 98.2 F (36.8 C) (Oral)  Wt 226 lb 6.4 oz (102.694 kg)  SpO2 96% General appearance: alert, cooperative, appears stated age and no distress Extremities: weakness in legs , pain down legs Neurologic: Motor: weakness in legs  Gait: painful and unsteady      Assessment & Plan:

## 2012-11-13 NOTE — Addendum Note (Signed)
Addended by: Arnette Norris on: 11/13/2012 04:09 PM   Modules accepted: Orders

## 2012-11-13 NOTE — Assessment & Plan Note (Signed)
Doxycycline for 10 days Pt to see surgery if it is no better

## 2012-11-16 ENCOUNTER — Ambulatory Visit (HOSPITAL_COMMUNITY)
Admission: RE | Admit: 2012-11-16 | Discharge: 2012-11-16 | Disposition: A | Discharge status: Home / Self Care | Payer: Medicaid Other | Source: Ambulatory Visit | Attending: Family Medicine | Admitting: Family Medicine

## 2012-11-16 DIAGNOSIS — M5137 Other intervertebral disc degeneration, lumbosacral region: Secondary | ICD-10-CM | POA: Insufficient documentation

## 2012-11-16 DIAGNOSIS — M545 Low back pain, unspecified: Secondary | ICD-10-CM | POA: Insufficient documentation

## 2012-11-16 DIAGNOSIS — M79604 Pain in right leg: Secondary | ICD-10-CM

## 2012-11-16 DIAGNOSIS — M51379 Other intervertebral disc degeneration, lumbosacral region without mention of lumbar back pain or lower extremity pain: Secondary | ICD-10-CM | POA: Insufficient documentation

## 2012-11-16 DIAGNOSIS — M79609 Pain in unspecified limb: Secondary | ICD-10-CM | POA: Insufficient documentation

## 2012-11-20 ENCOUNTER — Telehealth: Payer: Self-pay | Admitting: *Deleted

## 2012-11-20 DIAGNOSIS — M79604 Pain in right leg: Secondary | ICD-10-CM

## 2012-11-20 DIAGNOSIS — M545 Low back pain: Secondary | ICD-10-CM

## 2012-11-20 NOTE — Telephone Encounter (Signed)
Message copied by Verdene Rio on Tue Nov 20, 2012  4:48 PM ------      Message from: Lelon Perla      Created: Fri Nov 16, 2012  2:49 PM       +disc bulge, no stenosis      + arthritis      Refer to ortho

## 2012-11-20 NOTE — Telephone Encounter (Signed)
Discuss with patient, orders placed.

## 2012-11-26 NOTE — ED Provider Notes (Signed)
Medical screening examination/treatment/procedure(s) were performed by non-physician practitioner and as supervising physician I was immediately available for consultation/collaboration.  Donnetta Hutching, MD 11/26/12 [phone]

## 2012-12-07 ENCOUNTER — Encounter: Payer: Self-pay | Admitting: Family Medicine

## 2012-12-19 ENCOUNTER — Telehealth: Payer: Self-pay | Admitting: Family Medicine

## 2012-12-19 NOTE — Telephone Encounter (Signed)
In reference to Orthopaedic referral entered on 11/20/12, for low back pain, patient was never scheduled with Orthopaedic.  Due to patient having only [place], up front her cost would have been $250, patient was aware of this.  [facility] left multiple messages for patient to return their call, as did I.  I also mailed patient a letter.  As of today, 12/19/12, patient is not responding.

## 2013-05-02 ENCOUNTER — Emergency Department (HOSPITAL_COMMUNITY): Payer: Medicare Other

## 2013-05-02 ENCOUNTER — Inpatient Hospital Stay (HOSPITAL_COMMUNITY)
Admission: EM | Admit: 2013-05-02 | Discharge: 2013-05-05 | DRG: 313 | Disposition: A | Discharge status: Home / Self Care | Payer: Medicare Other | Attending: Internal Medicine | Admitting: Internal Medicine

## 2013-05-02 ENCOUNTER — Other Ambulatory Visit: Payer: Self-pay

## 2013-05-02 ENCOUNTER — Encounter (HOSPITAL_COMMUNITY): Payer: Self-pay | Admitting: Adult Health

## 2013-05-02 DIAGNOSIS — N281 Cyst of kidney, acquired: Secondary | ICD-10-CM

## 2013-05-02 DIAGNOSIS — R109 Unspecified abdominal pain: Secondary | ICD-10-CM

## 2013-05-02 DIAGNOSIS — J309 Allergic rhinitis, unspecified: Secondary | ICD-10-CM

## 2013-05-02 DIAGNOSIS — K921 Melena: Secondary | ICD-10-CM

## 2013-05-02 DIAGNOSIS — F329 Major depressive disorder, single episode, unspecified: Secondary | ICD-10-CM | POA: Diagnosis present

## 2013-05-02 DIAGNOSIS — J301 Allergic rhinitis due to pollen: Secondary | ICD-10-CM

## 2013-05-02 DIAGNOSIS — D72829 Elevated white blood cell count, unspecified: Secondary | ICD-10-CM | POA: Diagnosis present

## 2013-05-02 DIAGNOSIS — J029 Acute pharyngitis, unspecified: Secondary | ICD-10-CM | POA: Diagnosis present

## 2013-05-02 DIAGNOSIS — J019 Acute sinusitis, unspecified: Secondary | ICD-10-CM

## 2013-05-02 DIAGNOSIS — R599 Enlarged lymph nodes, unspecified: Secondary | ICD-10-CM | POA: Diagnosis present

## 2013-05-02 DIAGNOSIS — D259 Leiomyoma of uterus, unspecified: Secondary | ICD-10-CM

## 2013-05-02 DIAGNOSIS — F3289 Other specified depressive episodes: Secondary | ICD-10-CM | POA: Diagnosis present

## 2013-05-02 DIAGNOSIS — R1310 Dysphagia, unspecified: Secondary | ICD-10-CM | POA: Diagnosis present

## 2013-05-02 DIAGNOSIS — M545 Low back pain, unspecified: Secondary | ICD-10-CM

## 2013-05-02 DIAGNOSIS — R6510 Systemic inflammatory response syndrome (SIRS) of non-infectious origin without acute organ dysfunction: Secondary | ICD-10-CM

## 2013-05-02 DIAGNOSIS — J45909 Unspecified asthma, uncomplicated: Secondary | ICD-10-CM

## 2013-05-02 DIAGNOSIS — E876 Hypokalemia: Secondary | ICD-10-CM | POA: Diagnosis present

## 2013-05-02 DIAGNOSIS — Z862 Personal history of diseases of the blood and blood-forming organs and certain disorders involving the immune mechanism: Secondary | ICD-10-CM

## 2013-05-02 DIAGNOSIS — I10 Essential (primary) hypertension: Secondary | ICD-10-CM | POA: Diagnosis not present

## 2013-05-02 DIAGNOSIS — N390 Urinary tract infection, site not specified: Secondary | ICD-10-CM

## 2013-05-02 DIAGNOSIS — Z7982 Long term (current) use of aspirin: Secondary | ICD-10-CM

## 2013-05-02 DIAGNOSIS — L0291 Cutaneous abscess, unspecified: Secondary | ICD-10-CM

## 2013-05-02 DIAGNOSIS — J3489 Other specified disorders of nose and nasal sinuses: Secondary | ICD-10-CM | POA: Diagnosis not present

## 2013-05-02 DIAGNOSIS — R000 Tachycardia, unspecified: Secondary | ICD-10-CM

## 2013-05-02 DIAGNOSIS — K219 Gastro-esophageal reflux disease without esophagitis: Secondary | ICD-10-CM

## 2013-05-02 DIAGNOSIS — J9819 Other pulmonary collapse: Secondary | ICD-10-CM | POA: Diagnosis not present

## 2013-05-02 DIAGNOSIS — M79609 Pain in unspecified limb: Secondary | ICD-10-CM

## 2013-05-02 DIAGNOSIS — IMO0002 Reserved for concepts with insufficient information to code with codable children: Secondary | ICD-10-CM

## 2013-05-02 DIAGNOSIS — R079 Chest pain, unspecified: Principal | ICD-10-CM

## 2013-05-02 DIAGNOSIS — M1990 Unspecified osteoarthritis, unspecified site: Secondary | ICD-10-CM

## 2013-05-02 DIAGNOSIS — N644 Mastodynia: Secondary | ICD-10-CM

## 2013-05-02 DIAGNOSIS — R011 Cardiac murmur, unspecified: Secondary | ICD-10-CM | POA: Diagnosis present

## 2013-05-02 DIAGNOSIS — I517 Cardiomegaly: Secondary | ICD-10-CM | POA: Diagnosis not present

## 2013-05-02 DIAGNOSIS — D239 Other benign neoplasm of skin, unspecified: Secondary | ICD-10-CM

## 2013-05-02 DIAGNOSIS — F332 Major depressive disorder, recurrent severe without psychotic features: Secondary | ICD-10-CM

## 2013-05-02 DIAGNOSIS — N760 Acute vaginitis: Secondary | ICD-10-CM

## 2013-05-02 LAB — BASIC METABOLIC PANEL
BUN: 10 mg/dL (ref 6–23)
CO2: 31 mEq/L (ref 19–32)
Calcium: 9.8 mg/dL (ref 8.4–10.5)
Chloride: 100 mEq/L (ref 96–112)
Creatinine, Ser: 0.67 mg/dL (ref 0.50–1.10)
GFR calc Af Amer: >90 mL/min (ref >90)
GFR calc non Af Amer: >90 mL/min (ref >90)
Glucose, Bld: 137 mg/dL — ABNORMAL HIGH (ref 70–99)
Potassium: 3.1 mEq/L — ABNORMAL LOW (ref 3.5–5.1)
Sodium: 140 mEq/L (ref 135–145)

## 2013-05-02 LAB — CBC
HCT: 39.6 % (ref 36.0–46.0)
Hemoglobin: 13.9 g/dL (ref 12.0–15.0)
MCH: 28.7 pg (ref 26.0–34.0)
MCHC: 35.1 g/dL (ref 30.0–36.0)
MCV: 81.8 fL (ref 78.0–100.0)
Platelets: 249 10*3/uL (ref 150–400)
RBC: 4.84 MIL/uL (ref 3.87–5.11)
RDW: 14.6 % (ref 11.5–15.5)
WBC: 8.3 10*3/uL (ref 4.0–10.5)

## 2013-05-02 LAB — POCT I-STAT TROPONIN I: Troponin i, poc: 0 ng/mL (ref 0.00–0.08)

## 2013-05-02 LAB — RAPID STREP SCREEN (MED CTR MEBANE ONLY): Streptococcus, Group A Screen (Direct): NEGATIVE

## 2013-05-02 MED ORDER — POTASSIUM CHLORIDE CRYS ER 20 MEQ PO TBCR
40.0000 meq | EXTENDED_RELEASE_TABLET | Freq: Once | ORAL | Status: AC
  Administered 2013-05-02: 40 meq via ORAL
  Filled 2013-05-02: qty 2

## 2013-05-02 MED ORDER — MORPHINE SULFATE 4 MG/ML IJ SOLN
4.0000 mg | Freq: Once | INTRAMUSCULAR | Status: AC
  Administered 2013-05-02: 4 mg via INTRAVENOUS
  Filled 2013-05-02: qty 1

## 2013-05-02 MED ORDER — IOHEXOL 350 MG/ML SOLN
100.0000 mL | Freq: Once | INTRAVENOUS | Status: AC | PRN
  Administered 2013-05-02: 100 mL via INTRAVENOUS

## 2013-05-02 MED ORDER — SODIUM CHLORIDE 0.9 % IV BOLUS (SEPSIS)
1000.0000 mL | Freq: Once | INTRAVENOUS | Status: AC
  Administered 2013-05-02: 1000 mL via INTRAVENOUS

## 2013-05-02 MED ORDER — ASPIRIN 81 MG PO CHEW
324.0000 mg | CHEWABLE_TABLET | Freq: Once | ORAL | Status: AC
  Administered 2013-05-02: 324 mg via ORAL
  Filled 2013-05-02: qty 4

## 2013-05-02 MED ORDER — NITROGLYCERIN 0.4 MG SL SUBL
0.4000 mg | SUBLINGUAL_TABLET | SUBLINGUAL | Status: DC | PRN
  Administered 2013-05-03: 0.4 mg via SUBLINGUAL

## 2013-05-02 NOTE — ED Notes (Addendum)
MD Patal  At bedside.

## 2013-05-02 NOTE — ED Notes (Addendum)
Presents with 2-3 weeks of intermittent sharp chest pains located in bilateral sides of chest associated with SOB, nausea and lightheadeness. Pt reports the pains getting progressively worse. Nothing makes pain better and nothing makes pain worse. Pt also c/o sore throat that began this AM.

## 2013-05-02 NOTE — ED Notes (Signed)
Pt c/o bilateral cp with no radiation. Pt states the pain got progressively worse throughout the day. Pt rates pain 9/10 and describes it as tightness. Pt states she has had this before and went to the Dr. But they did not know what was causing her pain.

## 2013-05-02 NOTE — ED Provider Notes (Signed)
History    CSN: [id number] Arrival date & time 05/02/13  1751  First MD Initiated Contact with Patient 05/02/13 2025     Chief Complaint  Patient presents with  . Chest Pain   (Consider location/radiation/quality/duration/timing/severity/associated sxs/prior Treatment) HPI Comments: Patient presents emergency department with chief complaint of chest pain times one week. She states it is been intermittent and sharp stabbing pains. She states that it is associated with shortness of breath. She has not tried anything to alleviate her symptoms. Nothing makes her symptoms better or worse. She states that she has high blood pressure and high cholesterol, and is currently not taking her blood pressure medications. She states the pain is 9/10 at its worst. The pain does not radiate.  The history is provided by the patient. No language interpreter was used.   Past Medical History  Diagnosis Date  . Hypertension   . Depression   . Asthma   . Arthritis   . Heart murmur    Past Surgical History  Procedure Laterality Date  . Abdominal hysterectomy    . Bladder suspension    . Appendectomy    . Cesarean section      3 previous  . Tubal ligation     Family History  Problem Relation Age of Onset  . Hypertension Mother   . Asthma Mother   . Hypertension Sister   . Asthma Sister   . Hypertension Maternal Grandmother   . Heart defect Sister   . Asthma Sister   . Aneurysm Sister   . Asthma Sister    History  Substance Use Topics  . Smoking status: Never Smoker   . Smokeless tobacco: Never Used  . Alcohol Use: No   OB History   Grav Para Term Preterm Abortions TAB SAB Ect Mult Living   3 3 3       3      Review of Systems  All other systems reviewed and are negative.    Allergies  Codeine  Home Medications   Current Outpatient Rx  Name  Route  Sig  Dispense  Refill  . Acetaminophen (PAIN RELIEVER PO)   Oral   Take 2 tablets by mouth daily.         Marland Kitchen albuterol  (PROVENTIL HFA;VENTOLIN HFA) 108 (90 BASE) MCG/ACT inhaler   Inhalation   Inhale 2 puffs into the lungs every 6 (six) hours as needed. For shortness of breath.         . cloNIDine (CATAPRES) 0.1 MG tablet   Oral   Take 1 tablet (0.1 mg total) by mouth 2 (two) times daily.   60 tablet   5   . triamterene-hydrochlorothiazide (MAXZIDE) 75-50 MG per tablet   Oral   Take 1 tablet by mouth daily.          BP 155/100  Pulse 85  Temp(Src) 99.5 F (37.5 C) (Oral)  Resp 16  SpO2 97% Physical Exam  Nursing note and vitals reviewed. Constitutional: She is oriented to person, place, and time. She appears well-developed and well-nourished.  HENT:  Head: Normocephalic and atraumatic.  Eyes: Conjunctivae and EOM are normal. Pupils are equal, round, and reactive to light.  Neck: Normal range of motion. Neck supple.  Cardiovascular: Regular rhythm.  Exam reveals no gallop and no friction rub.   No murmur heard. Tachycardic  Pulmonary/Chest: Effort normal and breath sounds normal. No respiratory distress. She has no wheezes. She has no rales. She exhibits no tenderness.  Abdominal:  Soft. Bowel sounds are normal. She exhibits no distension and no mass. There is no tenderness. There is no rebound and no guarding.  Musculoskeletal: Normal range of motion. She exhibits no edema and no tenderness.  Neurological: She is alert and oriented to person, place, and time.  Skin: Skin is warm and dry.  Psychiatric: She has a normal mood and affect. Her behavior is normal. Judgment and thought content normal.    ED Course  Procedures (including critical care time) Labs Reviewed  BASIC METABOLIC PANEL - Abnormal; Notable for the following:    Potassium 3.1 (*)    Glucose, Bld 137 (*)    All other components within normal limits  RAPID STREP SCREEN  CULTURE, GROUP A STREP  CBC  POCT I-STAT TROPONIN I   ED ECG REPORT  I personally interpreted this EKG   Date: 05/02/2013   Rate: 90  Rhythm:  normal sinus rhythm  QRS Axis: left  Intervals: normal  ST/T Wave abnormalities: normal  Conduction Disutrbances:none  Narrative Interpretation:   Old EKG Reviewed: unchanged  9:19 PM ED ECG REPORT  I personally interpreted this EKG   Date: 05/02/2013   Rate: 109  Rhythm: sinus tachycardia  QRS Axis: Left  Intervals: normal  ST/T Wave abnormalities: normal  Conduction Disutrbances:none  Narrative Interpretation:   Old EKG Reviewed: changes noted      Dg Chest 2 View  05/02/2013   *RADIOLOGY REPORT*  Clinical Data: Chest pain.  CHEST - 2 VIEW  Comparison: Chest x-ray 11/09/2012.  Findings: Lung volumes are normal.  No consolidative airspace disease.  No pleural effusions.  No pneumothorax.  No pulmonary nodule or mass noted.  Pulmonary vasculature and the cardiomediastinal silhouette are within normal limits. Atherosclerosis in the thoracic aorta.  Surgical clips project over the right upper quadrant of the abdomen, likely from prior cholecystectomy.  IMPRESSION: 1. No radiographic evidence of acute cardiopulmonary disease. 2.  Atherosclerosis.   Original Report Authenticated By: Trudie Reed, M.D.   1. Chest pain     MDM  Patient with intermittent chest pain times one week. States that it is associated with shortness of breath. Initial troponin is negative, chest x-ray is negative, patient is tachycardic, concern for PE, will order CT angio chest. Discussed with Dr. Dan Humphreys, who agrees with the plan. Pain control with morphine. Patient given aspirin. Also give him fluids.  10:50 PM CT is negative.  Discussed with Dr. Dan Humphreys.  Patient has had no resolution of her chest pain, though it has decreased.  She has become more tachycardic in the ED, despite getting fluids.  Will admit the patient for chest pain and tachycardia.       Roxy Horseman, PA-C 05/02/13 2307

## 2013-05-03 ENCOUNTER — Encounter (HOSPITAL_COMMUNITY): Payer: Self-pay | Admitting: Internal Medicine

## 2013-05-03 DIAGNOSIS — R6510 Systemic inflammatory response syndrome (SIRS) of non-infectious origin without acute organ dysfunction: Secondary | ICD-10-CM

## 2013-05-03 DIAGNOSIS — R000 Tachycardia, unspecified: Secondary | ICD-10-CM

## 2013-05-03 DIAGNOSIS — I10 Essential (primary) hypertension: Secondary | ICD-10-CM

## 2013-05-03 DIAGNOSIS — R079 Chest pain, unspecified: Secondary | ICD-10-CM

## 2013-05-03 DIAGNOSIS — I517 Cardiomegaly: Secondary | ICD-10-CM

## 2013-05-03 LAB — URINALYSIS, ROUTINE W REFLEX MICROSCOPIC
Bilirubin Urine: NEGATIVE
Glucose, UA: NEGATIVE mg/dL
Hgb urine dipstick: NEGATIVE
Ketones, ur: NEGATIVE mg/dL
Leukocytes, UA: NEGATIVE
Nitrite: NEGATIVE
Protein, ur: NEGATIVE mg/dL
Specific Gravity, Urine: 1.022 (ref 1.005–1.030)
Urobilinogen, UA: 0.2 mg/dL (ref 0.0–1.0)
pH: 6 (ref 5.0–8.0)

## 2013-05-03 LAB — TSH: TSH: 1.092 u[IU]/mL (ref 0.350–4.500)

## 2013-05-03 LAB — CBC
HCT: 33.2 % — ABNORMAL LOW (ref 36.0–46.0)
Hemoglobin: 11.5 g/dL — ABNORMAL LOW (ref 12.0–15.0)
MCH: 28.3 pg (ref 26.0–34.0)
MCHC: 34.6 g/dL (ref 30.0–36.0)
MCV: 81.8 fL (ref 78.0–100.0)
Platelets: 207 10*3/uL (ref 150–400)
RBC: 4.06 MIL/uL (ref 3.87–5.11)
RDW: 14.9 % (ref 11.5–15.5)
WBC: 11.1 10*3/uL — ABNORMAL HIGH (ref 4.0–10.5)

## 2013-05-03 LAB — TROPONIN I
Troponin I: <0.3 ng/mL (ref <0.30)
Troponin I: <0.3 ng/mL (ref <0.30)
Troponin I: <0.3 ng/mL (ref <0.30)

## 2013-05-03 LAB — BASIC METABOLIC PANEL
BUN: 7 mg/dL (ref 6–23)
CO2: 26 mEq/L (ref 19–32)
Calcium: 8.2 mg/dL — ABNORMAL LOW (ref 8.4–10.5)
Chloride: 103 mEq/L (ref 96–112)
Creatinine, Ser: 0.7 mg/dL (ref 0.50–1.10)
GFR calc Af Amer: >90 mL/min (ref >90)
GFR calc non Af Amer: >90 mL/min (ref >90)
Glucose, Bld: 128 mg/dL — ABNORMAL HIGH (ref 70–99)
Potassium: 3.1 mEq/L — ABNORMAL LOW (ref 3.5–5.1)
Sodium: 138 mEq/L (ref 135–145)

## 2013-05-03 MED ORDER — ASPIRIN EC 325 MG PO TBEC
325.0000 mg | DELAYED_RELEASE_TABLET | Freq: Every day | ORAL | Status: DC
  Administered 2013-05-03 – 2013-05-05 (×3): 325 mg via ORAL
  Filled 2013-05-03 (×3): qty 1

## 2013-05-03 MED ORDER — ACETAMINOPHEN 325 MG PO TABS
650.0000 mg | ORAL_TABLET | ORAL | Status: DC | PRN
  Administered 2013-05-03: 650 mg via ORAL
  Filled 2013-05-03: qty 2

## 2013-05-03 MED ORDER — OXYCODONE-ACETAMINOPHEN 5-325 MG PO TABS
1.0000 | ORAL_TABLET | Freq: Four times a day (QID) | ORAL | Status: DC | PRN
  Administered 2013-05-03 – 2013-05-04 (×2): 1 via ORAL
  Filled 2013-05-03 (×2): qty 1

## 2013-05-03 MED ORDER — ENOXAPARIN SODIUM 40 MG/0.4ML ~~LOC~~ SOLN
40.0000 mg | SUBCUTANEOUS | Status: DC
  Administered 2013-05-03 – 2013-05-04 (×2): 40 mg via SUBCUTANEOUS
  Filled 2013-05-03 (×3): qty 0.4

## 2013-05-03 MED ORDER — KETOROLAC TROMETHAMINE 30 MG/ML IJ SOLN
30.0000 mg | Freq: Three times a day (TID) | INTRAMUSCULAR | Status: DC | PRN
  Administered 2013-05-03 – 2013-05-04 (×4): 30 mg via INTRAVENOUS
  Filled 2013-05-03 (×4): qty 1

## 2013-05-03 MED ORDER — SODIUM CHLORIDE 0.9 % IV BOLUS (SEPSIS)
500.0000 mL | Freq: Once | INTRAVENOUS | Status: AC
  Administered 2013-05-03: 500 mL via INTRAVENOUS

## 2013-05-03 MED ORDER — SODIUM CHLORIDE 0.9 % IV SOLN
INTRAVENOUS | Status: DC
  Administered 2013-05-03 (×2): via INTRAVENOUS

## 2013-05-03 MED ORDER — TRIAMTERENE-HCTZ 75-50 MG PO TABS
1.0000 | ORAL_TABLET | Freq: Every day | ORAL | Status: DC
  Administered 2013-05-03 – 2013-05-05 (×3): 1 via ORAL
  Filled 2013-05-03 (×3): qty 1

## 2013-05-03 MED ORDER — POTASSIUM CHLORIDE CRYS ER 20 MEQ PO TBCR
40.0000 meq | EXTENDED_RELEASE_TABLET | Freq: Once | ORAL | Status: AC
  Administered 2013-05-03: 40 meq via ORAL
  Filled 2013-05-03: qty 2

## 2013-05-03 MED ORDER — IPRATROPIUM BROMIDE 0.02 % IN SOLN
0.5000 mg | Freq: Four times a day (QID) | RESPIRATORY_TRACT | Status: DC
  Administered 2013-05-03: 0.5 mg via RESPIRATORY_TRACT
  Filled 2013-05-03: qty 2.5

## 2013-05-03 MED ORDER — CLONIDINE HCL 0.1 MG PO TABS
0.1000 mg | ORAL_TABLET | Freq: Two times a day (BID) | ORAL | Status: DC
  Administered 2013-05-03 – 2013-05-05 (×5): 0.1 mg via ORAL
  Filled 2013-05-03 (×6): qty 1

## 2013-05-03 MED ORDER — ONDANSETRON HCL 4 MG/2ML IJ SOLN: 4.0000 mg | Freq: Four times a day (QID) | INTRAMUSCULAR | Status: DC | PRN

## 2013-05-03 MED ORDER — METOPROLOL TARTRATE 1 MG/ML IV SOLN: 2.5000 mg | Freq: Once | INTRAVENOUS | Status: DC

## 2013-05-03 MED ORDER — OXYCODONE-ACETAMINOPHEN 5-325 MG PO TABS
1.0000 | ORAL_TABLET | Freq: Once | ORAL | Status: AC
  Administered 2013-05-03: 1 via ORAL
  Filled 2013-05-03: qty 1

## 2013-05-03 MED ORDER — ALBUTEROL SULFATE HFA 108 (90 BASE) MCG/ACT IN AERS: 2.0000 | INHALATION_SPRAY | Freq: Four times a day (QID) | RESPIRATORY_TRACT | Status: DC | PRN

## 2013-05-03 NOTE — ED Notes (Addendum)
MD patel updated on patient status by page as instructed by MD. Patient heart rate decreased from 140s to 110s after Bolus of 500 mL. Pt is currently on a continious dose of Normal Saline at 75 mL/hr. Pt stable at the moment. MD states he thinks it may not be cardiac related at this time. Not sure which route of care he would like to take but is looking toward musculoskeletal. Pt continued to be monitored. Pt is resting with family by the bedside. Admitting orders are in by MD.

## 2013-05-03 NOTE — Progress Notes (Signed)
Utilization review completed.  

## 2013-05-03 NOTE — ED Notes (Signed)
Md admitted provider paged due to patient states she is still having discomfort in the right and left side of her chest. Pt states nothing has helped with this pain. Pt states she feels better laying toward her right side.

## 2013-05-03 NOTE — ED Notes (Signed)
Pt nurse on [address] updated on patient condition.

## 2013-05-03 NOTE — Progress Notes (Signed)
*  PRELIMINARY RESULTS* Echocardiogram 2D Echocardiogram has been performed.  Heather Walker 05/03/2013, 11:44 AM

## 2013-05-03 NOTE — H&P (Signed)
[facility] History and Physical  Heather Walker NWG:[id number] DOB: [birth year] DOA: 05/02/2013  Referring physician: Dr Dan Humphreys PCP: Loreen Freud, DO   Chief Complaint: chest pain since last 5 days,   HPI: Heather Walker is a 52 y.o. female  The pt with PMH of Hypertension and Asthma present with Chest tightness that has been ongoing since last 5 days. It has located on both side of chest, worsening with deep breaths and has never had similar symptoms before. She has denied any fever, chills, cough, SOB as well. She denied any leg swelling. She has not been taken any blood pressure medication today.   Review of Systems: as mentioned in the history of present illness. Review of the other systems are negative.  Past Medical History  Diagnosis Date  . Hypertension   . Depression   . Asthma   . Arthritis   . Heart murmur    Past Surgical History  Procedure Laterality Date  . Abdominal hysterectomy    . Bladder suspension    . Appendectomy    . Cesarean section      3 previous  . Tubal ligation     Social History:  reports that she has never smoked. She has never used smokeless tobacco. She reports that she does not drink alcohol or use illicit drugs.  Allergies  Allergen Reactions  . Codeine Itching    Family History  Problem Relation Age of Onset  . Hypertension Mother   . Asthma Mother   . Hypertension Sister   . Asthma Sister   . Hypertension Maternal Grandmother   . Heart defect Sister   . Asthma Sister   . Aneurysm Sister   . Asthma Sister       Prior to Admission medications   Medication Sig Start Date End Date Taking? Authorizing Provider  Acetaminophen (PAIN RELIEVER PO) Take 2 tablets by mouth daily.   Yes Historical Provider, MD  albuterol (PROVENTIL HFA;VENTOLIN HFA) 108 (90 BASE) MCG/ACT inhaler Inhale 2 puffs into the lungs every 6 (six) hours as needed. For shortness of breath.   Yes Historical Provider, MD  cloNIDine (CATAPRES) 0.1 MG tablet  Take 1 tablet (0.1 mg total) by mouth 2 (two) times daily. 06/29/12 06/29/13 Yes Yvonne R Lowne, DO  triamterene-hydrochlorothiazide (MAXZIDE) 75-50 MG per tablet Take 1 tablet by mouth daily.   Yes Historical Provider, MD   Physical Exam: Filed Vitals:   05/02/13 2315 05/02/13 2330 05/03/13 0000 05/03/13 0015  BP: 137/81 132/80 146/79 144/80  Pulse: 132 118 129 123  Temp:      TempSrc:      Resp: 26 21 21 25   SpO2: 94% 97% 95% 93%     General:  AAOx3, in resp distress  Eyes: PERRL  ENT: oral mucosa clear  Neck: no JVD  Cardiovascular: S1 S2 present  Respiratory: decreased breath sounds, no crackles  Abdomen: BS present and soft non tender  Skin: no rash  Neurologic: grossly normal  Labs on Admission:  Basic Metabolic Panel:  Recent Labs Lab 05/02/13 1806  NA 140  K 3.1*  CL 100  CO2 31  GLUCOSE 137*  BUN 10  CREATININE 0.67  CALCIUM 9.8   Liver Function Tests: No results found for this basename: AST, ALT, ALKPHOS, BILITOT, PROT, ALBUMIN,  in the last 168 hours No results found for this basename: LIPASE, AMYLASE,  in the last 168 hours No results found for this basename: AMMONIA,  in the last 168 hours  CBC:  Recent Labs Lab 05/02/13 1806  WBC 8.3  HGB 13.9  HCT 39.6  MCV 81.8  PLT 249   Cardiac Enzymes:  Recent Labs Lab 05/02/13 2349  TROPONINI <0.30    BNP (last 3 results) No results found for this basename: PROBNP,  in the last 8760 hours CBG: No results found for this basename: GLUCAP,  in the last 168 hours  Radiological Exams on Admission: Dg Chest 2 View  05/02/2013   *RADIOLOGY REPORT*  Clinical Data: Chest pain.  CHEST - 2 VIEW  Comparison: Chest x-ray 11/09/2012.  Findings: Lung volumes are normal.  No consolidative airspace disease.  No pleural effusions.  No pneumothorax.  No pulmonary nodule or mass noted.  Pulmonary vasculature and the cardiomediastinal silhouette are within normal limits. Atherosclerosis in the thoracic aorta.   Surgical clips project over the right upper quadrant of the abdomen, likely from prior cholecystectomy.  IMPRESSION: 1. No radiographic evidence of acute cardiopulmonary disease. 2.  Atherosclerosis.   Original Report Authenticated By: Trudie Reed, M.D.   Ct Angio Chest Pe W/cm &/or Wo Cm  05/02/2013   *RADIOLOGY REPORT*  Clinical Data: Intermittent sharp chest pain at both sides of the chest; shortness of breath, nausea and lightheadedness.  CT ANGIOGRAPHY CHEST  Technique:  Multidetector CT imaging of the chest using the standard protocol during bolus administration of intravenous contrast. Multiplanar reconstructed images including MIPs were obtained and reviewed to evaluate the vascular anatomy.  Contrast: OMNIPAQUE IOHEXOL 350 MG/ML SOLN  Comparison: Chest radiograph performed earlier today at 06:40 p.m.  Findings: There is no evidence of pulmonary embolus.  Minimal bibasilar atelectasis is noted, particularly about the descending thoracic aorta.  The lungs are otherwise clear.  There is no evidence of significant focal consolidation, pleural effusion or pneumothorax.  No masses are identified; no abnormal focal contrast enhancement is seen.  The mediastinum is unremarkable in appearance.  No mediastinal lymphadenopathy is seen.  Scattered high-density material is noted within the esophagus and stomach.  No pericardial effusion is identified.  The great vessels are grossly unremarkable in appearance.  No axillary lymphadenopathy is seen.  The visualized portions of the thyroid gland are unremarkable in appearance.  The visualized portions of the liver and spleen are unremarkable.  No acute osseous abnormalities are seen.  Prominent bridging anterior osteophytes are seen along the lower thoracic spine, compatible with DISH.  IMPRESSION:  1.  No evidence of pulmonary embolus. 2.  Minimal bibasilar atelectasis noted; lungs otherwise clear. 3.  Findings of DISH incidentally noted along the lower  thoracic spine.   Original Report Authenticated By: Tonia Ghent, M.D.    EKG: Independently reviewed. Sinus Tachycardia without any ST-T wave changes suggestive of ischemia.   Assessment/Plan Principal Problem:   CHEST PAIN Active Problems:   HYPERTENSION   ASTHMA   Tachycardia  1. Chest pain: Less likely cardiac on origin. The pt does had some runny nose but EKG and negative troponin does not suggest pericarditis. CT chest angio is negative for any PE and also no pneumonia, Mild atelectasis. Serial troponin and TTE tomorrow. Lipid profile in AM along with TSH  2. Hypertension Continue home medication. If possible gradual transition clonidine to metoprolol for better heart rate control.  3. Asthma Prn Ipratropium, PRN PEFR monitoring. No sign of acute exacerbation at present  4. Tachycardia Likely due to pain, But responded to fluid boluses so continuing gentle hydration. No complain of diarrhea or excess urination. Can reconsider blood pressure medication  again since on high dose of diuretics.   DVT Prophylaxis: lovenox Nutrition: low salt diet  Code Status: full   Author: Lynden Oxford, MD [facility] Pager: [phone] 05/03/2013 12:35 AM    If 7PM-7AM, please contact night-coverage [email or web address] Password [email or web address]

## 2013-05-03 NOTE — Progress Notes (Signed)
PATIENT DETAILS Name: Heather Walker Age: 52 y.o. Sex: female Date of Birth: 1961/06/25 Admit Date: 05/02/2013 Admitting Physician Lynden Oxford, MD [id number] Laury Axon, DO  Subjective: Febrile overnight, chest pain now only some discomfort. Headache minimal as well.  Assessment/Plan: Principal Problem:   CHEST PAIN -was bilateral-doubt cardiac etiology -CTA chest neg for PE -Echo-shows preserved EF with no wall motion abnormality -associated with fever and tachycardia-?pericarditis-however doubt that as well -monitor in Telemetry -follow troponins  Active Problems: SIR's -etiology/foci of infection remains uncertain -No PNA seen on CT Chest or CXR, UA neg for UTI -await UA, check blood cultures -monitor off antibiotics for now    HYPERTENSION -controlled with Clonidine and HCTZ/Triamterene  Hypokalemia -replete and recheck in am -suspect 2/2 diuretics    ASTHMA -stable -lungs clear    Tachycardia -sinus -2/2 SIRS -resolved with IVF -CTA Chest neg for PE, Echo-preserved LV  Disposition: Remain inpatient  DVT Prophylaxis: Prophylactic Lovenox   Code Status: Full code   Procedures:  None  CONSULTS:  None   MEDICATIONS: Scheduled Meds: . aspirin EC  325 mg Oral Daily  . cloNIDine  0.1 mg Oral BID  . enoxaparin (LOVENOX) injection  40 mg Subcutaneous Q24H  . potassium chloride  40 mEq Oral Once  . triamterene-hydrochlorothiazide  1 tablet Oral Daily   Continuous Infusions: . sodium chloride 75 mL/hr at 05/03/13 0406   PRN Meds:.acetaminophen, albuterol, ketorolac, nitroGLYCERIN, ondansetron (ZOFRAN) IV, oxyCODONE-acetaminophen  Antibiotics: Anti-infectives   None       PHYSICAL EXAM: Vital signs in last 24 hours: Filed Vitals:   05/03/13 0215 05/03/13 0237 05/03/13 0600 05/03/13 0800  BP: 152/96 162/90 149/92 127/70  Pulse: 106 109 105 105  Temp:  101 F (38.3 C) 100.1 F (37.8 C) 99.6 F (37.6 C)  TempSrc:  Oral Oral Oral  Resp: 22   18 17   Height:  5\' 4"  (1.626 m)    Weight:  101.833 kg (224 lb 8 oz)    SpO2: 92% 100% 92% 95%    Weight change:  Filed Weights   05/03/13 0237  Weight: 101.833 kg (224 lb 8 oz)   Body mass index is 38.52 kg/(m^2).   Gen Exam: Awake and alert with clear speech.   Neck: Supple, No JVD.   Chest: B/L Clear.   CVS: S1 S2 Regular, no murmurs.  Abdomen: soft, BS +, non tender, non distended.  Extremities: no edema, lower extremities warm to touch. Neurologic: Non Focal.   Skin: No Rash.  Wounds: N/A.    Intake/Output from previous day: No intake or output data in the 24 hours ending 05/03/13 1302   LAB RESULTS: CBC  Recent Labs Lab 05/02/13 1806 05/03/13 0835  WBC 8.3 11.1*  HGB 13.9 11.5*  HCT 39.6 33.2*  PLT 249 207  MCV 81.8 81.8  MCH 28.7 28.3  MCHC 35.1 34.6  RDW 14.6 14.9    Chemistries   Recent Labs Lab 05/02/13 1806 05/03/13 0835  NA 140 138  K 3.1* 3.1*  CL 100 103  CO2 31 26  GLUCOSE 137* 128*  BUN 10 7  CREATININE 0.67 0.70  CALCIUM 9.8 8.2*    CBG: No results found for this basename: GLUCAP,  in the last 168 hours  GFR Estimated Creatinine Clearance: 97.6 ml/min (by C-G formula based on Cr of 0.7).  Coagulation profile No results found for this basename: INR, PROTIME,  in the last 168 hours  Cardiac Enzymes  Recent Labs Lab 05/02/13 2349 05/03/13  9604 05/03/13 0825  TROPONINI <0.30 <0.30 <0.30    No components found with this basename: POCBNP,  No results found for this basename: DDIMER,  in the last 72 hours No results found for this basename: HGBA1C,  in the last 72 hours No results found for this basename: CHOL, HDL, LDLCALC, TRIG, CHOLHDL, LDLDIRECT,  in the last 72 hours No results found for this basename: TSH, T4TOTAL, FREET3, T3FREE, THYROIDAB,  in the last 72 hours No results found for this basename: VITAMINB12, FOLATE, FERRITIN, TIBC, IRON, RETICCTPCT,  in the last 72 hours No results found for this basename: LIPASE,  AMYLASE,  in the last 72 hours  Urine Studies No results found for this basename: UACOL, UAPR, USPG, UPH, UTP, UGL, UKET, UBIL, UHGB, UNIT, UROB, ULEU, UEPI, UWBC, URBC, UBAC, CAST, CRYS, UCOM, BILUA,  in the last 72 hours  MICROBIOLOGY: Recent Results (from the past 240 hour(s))  RAPID STREP SCREEN     Status: None   Collection Time    05/02/13  6:10 PM      Result Value Range Status   Streptococcus, Group A Screen (Direct) NEGATIVE  NEGATIVE Final   Comment: (NOTE)     A Rapid Antigen test may result negative if the antigen level in the     sample is below the detection level of this test. The FDA has not     cleared this test as a stand-alone test therefore the rapid antigen     negative result has reflexed to a Group A Strep culture.    RADIOLOGY STUDIES/RESULTS: Dg Chest 2 View  05/02/2013   *RADIOLOGY REPORT*  Clinical Data: Chest pain.  CHEST - 2 VIEW  Comparison: Chest x-ray 11/09/2012.  Findings: Lung volumes are normal.  No consolidative airspace disease.  No pleural effusions.  No pneumothorax.  No pulmonary nodule or mass noted.  Pulmonary vasculature and the cardiomediastinal silhouette are within normal limits. Atherosclerosis in the thoracic aorta.  Surgical clips project over the right upper quadrant of the abdomen, likely from prior cholecystectomy.  IMPRESSION: 1. No radiographic evidence of acute cardiopulmonary disease. 2.  Atherosclerosis.   Original Report Authenticated By: Trudie Reed, M.D.   Ct Angio Chest Pe W/cm &/or Wo Cm  05/02/2013   *RADIOLOGY REPORT*  Clinical Data: Intermittent sharp chest pain at both sides of the chest; shortness of breath, nausea and lightheadedness.  CT ANGIOGRAPHY CHEST  Technique:  Multidetector CT imaging of the chest using the standard protocol during bolus administration of intravenous contrast. Multiplanar reconstructed images including MIPs were obtained and reviewed to evaluate the vascular anatomy.  Contrast: OMNIPAQUE  IOHEXOL 350 MG/ML SOLN  Comparison: Chest radiograph performed earlier today at 06:40 p.m.  Findings: There is no evidence of pulmonary embolus.  Minimal bibasilar atelectasis is noted, particularly about the descending thoracic aorta.  The lungs are otherwise clear.  There is no evidence of significant focal consolidation, pleural effusion or pneumothorax.  No masses are identified; no abnormal focal contrast enhancement is seen.  The mediastinum is unremarkable in appearance.  No mediastinal lymphadenopathy is seen.  Scattered high-density material is noted within the esophagus and stomach.  No pericardial effusion is identified.  The great vessels are grossly unremarkable in appearance.  No axillary lymphadenopathy is seen.  The visualized portions of the thyroid gland are unremarkable in appearance.  The visualized portions of the liver and spleen are unremarkable.  No acute osseous abnormalities are seen.  Prominent bridging anterior osteophytes are seen along  the lower thoracic spine, compatible with DISH.  IMPRESSION:  1.  No evidence of pulmonary embolus. 2.  Minimal bibasilar atelectasis noted; lungs otherwise clear. 3.  Findings of DISH incidentally noted along the lower thoracic spine.   Original Report Authenticated By: Tonia Ghent, M.D.    Jeoffrey Massed, MD  [facility] Pager:[phone]  If 7PM-7AM, please contact night-coverage [email or web address] Password [email or web address] 05/03/2013, 1:02 PM   LOS: 1 day

## 2013-05-03 NOTE — ED Notes (Signed)
[facility] admitting provider would like to give patient a bolus of fluid to see if her Heart rate decreases and pain comes down. Pt will be monitored on the cardiac monitor and MD paged within the hour on patient status. Lab at bedside.

## 2013-05-04 ENCOUNTER — Inpatient Hospital Stay (HOSPITAL_COMMUNITY): Payer: Medicare Other

## 2013-05-04 DIAGNOSIS — R000 Tachycardia, unspecified: Secondary | ICD-10-CM | POA: Diagnosis not present

## 2013-05-04 DIAGNOSIS — J3489 Other specified disorders of nose and nasal sinuses: Secondary | ICD-10-CM | POA: Diagnosis not present

## 2013-05-04 DIAGNOSIS — I10 Essential (primary) hypertension: Secondary | ICD-10-CM | POA: Diagnosis not present

## 2013-05-04 DIAGNOSIS — R6510 Systemic inflammatory response syndrome (SIRS) of non-infectious origin without acute organ dysfunction: Secondary | ICD-10-CM

## 2013-05-04 DIAGNOSIS — R079 Chest pain, unspecified: Secondary | ICD-10-CM | POA: Diagnosis not present

## 2013-05-04 LAB — BASIC METABOLIC PANEL
BUN: 7 mg/dL (ref 6–23)
CO2: 27 mEq/L (ref 19–32)
Calcium: 9.2 mg/dL (ref 8.4–10.5)
Chloride: 99 mEq/L (ref 96–112)
Creatinine, Ser: 0.73 mg/dL (ref 0.50–1.10)
GFR calc Af Amer: >90 mL/min (ref >90)
GFR calc non Af Amer: >90 mL/min (ref >90)
Glucose, Bld: 187 mg/dL — ABNORMAL HIGH (ref 70–99)
Potassium: 3.4 mEq/L — ABNORMAL LOW (ref 3.5–5.1)
Sodium: 135 mEq/L (ref 135–145)

## 2013-05-04 LAB — CBC
HCT: 34.5 % — ABNORMAL LOW (ref 36.0–46.0)
Hemoglobin: 12.1 g/dL (ref 12.0–15.0)
MCH: 28.4 pg (ref 26.0–34.0)
MCHC: 35.1 g/dL (ref 30.0–36.0)
MCV: 81 fL (ref 78.0–100.0)
Platelets: 191 10*3/uL (ref 150–400)
RBC: 4.26 MIL/uL (ref 3.87–5.11)
RDW: 14.8 % (ref 11.5–15.5)
WBC: 14 10*3/uL — ABNORMAL HIGH (ref 4.0–10.5)

## 2013-05-04 LAB — DIFFERENTIAL
Basophils Absolute: 0 10*3/uL (ref 0.0–0.1)
Basophils Relative: 0 % (ref 0–1)
Eosinophils Absolute: 0 10*3/uL (ref 0.0–0.7)
Eosinophils Relative: 0 % (ref 0–5)
Lymphocytes Relative: 10 % — ABNORMAL LOW (ref 12–46)
Lymphs Abs: 1.4 10*3/uL (ref 0.7–4.0)
Monocytes Absolute: 1.3 10*3/uL — ABNORMAL HIGH (ref 0.1–1.0)
Monocytes Relative: 9 % (ref 3–12)
Neutro Abs: 11.3 10*3/uL — ABNORMAL HIGH (ref 1.7–7.7)
Neutrophils Relative %: 81 % — ABNORMAL HIGH (ref 43–77)

## 2013-05-04 MED ORDER — TRAMADOL HCL 50 MG PO TABS
50.0000 mg | ORAL_TABLET | Freq: Once | ORAL | Status: AC
  Administered 2013-05-04: 50 mg via ORAL
  Filled 2013-05-04: qty 1

## 2013-05-04 MED ORDER — SALINE SPRAY 0.65 % NA SOLN
1.0000 | NASAL | Status: DC | PRN
  Filled 2013-05-04: qty 44

## 2013-05-04 MED ORDER — SENNA 8.6 MG PO TABS
2.0000 | ORAL_TABLET | Freq: Every day | ORAL | Status: DC
  Filled 2013-05-04 (×3): qty 2

## 2013-05-04 MED ORDER — POTASSIUM CHLORIDE CRYS ER 20 MEQ PO TBCR
40.0000 meq | EXTENDED_RELEASE_TABLET | Freq: Once | ORAL | Status: AC
  Administered 2013-05-04: 40 meq via ORAL
  Filled 2013-05-04: qty 2

## 2013-05-04 MED ORDER — POLYETHYLENE GLYCOL 3350 17 G PO PACK
17.0000 g | PACK | Freq: Two times a day (BID) | ORAL | Status: DC
  Administered 2013-05-04: 17 g via ORAL
  Filled 2013-05-04 (×5): qty 1

## 2013-05-04 NOTE — Progress Notes (Signed)
Utilization review complete 

## 2013-05-04 NOTE — Progress Notes (Signed)
PATIENT DETAILS Name: Heather Walker Age: 52 y.o. Sex: female Date of Birth: 09-Feb-1961 Admit Date: 05/02/2013 Admitting Physician Lynden Oxford, MD [id number] Laury Axon, DO  Subjective: Claims to feel better than on admission,however claims to have intermittent right sided abdominal pain and intermittent headaches-thinks her sinus's are congested.  Assessment/Plan: Principal Problem:   CHEST PAIN -was bilateral-doubt cardiac etiology -CTA chest neg for PE -Echo-shows preserved EF with no obvious wall motion abnormality -monitor in Telemetry -troponins neg  Active Problems: SIR's -etiology/foci of infection remains uncertain-?viral -No PNA seen on CT Chest or CXR, UA neg for UTI. Claims her sinuses are congested-will check Xray of her sinus for now -await blood cultures7/18 -monitor off antibiotics for now    HYPERTENSION -controlled with Clonidine and HCTZ/Triamterene  Hypokalemia -replete and recheck in am -suspect 2/2 diuretics    ASTHMA -stable -lungs clear    Tachycardia -sinus -2/2 SIRS -resolved with IVF -CTA Chest neg for PE, Echo-preserved LV  Disposition: Remain inpatient-likey home in am  DVT Prophylaxis: Prophylactic Lovenox   Code Status: Full code   Procedures:  None  CONSULTS:  None   MEDICATIONS: Scheduled Meds: . aspirin EC  325 mg Oral Daily  . cloNIDine  0.1 mg Oral BID  . enoxaparin (LOVENOX) injection  40 mg Subcutaneous Q24H  . triamterene-hydrochlorothiazide  1 tablet Oral Daily   Continuous Infusions:   PRN Meds:.acetaminophen, albuterol, ketorolac, nitroGLYCERIN, ondansetron (ZOFRAN) IV, oxyCODONE-acetaminophen, sodium chloride  Antibiotics: Anti-infectives   None       PHYSICAL EXAM: Vital signs in last 24 hours: Filed Vitals:   05/03/13 0800 05/03/13 1351 05/03/13 2000 05/04/13 0400  BP: 127/70 148/94 160/103 148/62  Pulse: 105 90 84 95  Temp: 99.6 F (37.6 C) 98.6 F (37 C) 98.4 F (36.9 C) 97.7 F (36.5  C)  TempSrc: Oral  Oral Axillary  Resp: 17 18    Height:      Weight:    108.047 kg (238 lb 3.2 oz)  SpO2: 95% 94% 97% 96%    Weight change: 6.214 kg (13 lb 11.2 oz) Filed Weights   05/03/13 0237 05/04/13 0400  Weight: 101.833 kg (224 lb 8 oz) 108.047 kg (238 lb 3.2 oz)   Body mass index is 40.87 kg/(m^2).   Gen Exam: Awake and alert with clear speech.   Neck: Supple, No JVD.   Chest: B/L Clear.   CVS: S1 S2 Regular, no murmurs.  Abdomen: soft, BS +, non tender, non distended.  Extremities: no edema, lower extremities warm to touch. Neurologic: Non Focal.   Skin: No Rash.  Wounds: N/A.    Intake/Output from previous day: No intake or output data in the 24 hours ending 05/04/13 0830   LAB RESULTS: CBC  Recent Labs Lab 05/02/13 1806 05/03/13 0835  WBC 8.3 11.1*  HGB 13.9 11.5*  HCT 39.6 33.2*  PLT 249 207  MCV 81.8 81.8  MCH 28.7 28.3  MCHC 35.1 34.6  RDW 14.6 14.9    Chemistries   Recent Labs Lab 05/02/13 1806 05/03/13 0835  NA 140 138  K 3.1* 3.1*  CL 100 103  CO2 31 26  GLUCOSE 137* 128*  BUN 10 7  CREATININE 0.67 0.70  CALCIUM 9.8 8.2*    CBG: No results found for this basename: GLUCAP,  in the last 168 hours  GFR Estimated Creatinine Clearance: 100.9 ml/min (by C-G formula based on Cr of 0.7).  Coagulation profile No results found for this basename: INR, PROTIME,  in the  last 168 hours  Cardiac Enzymes  Recent Labs Lab 05/02/13 2349 05/03/13 0548 05/03/13 0825  TROPONINI <0.30 <0.30 <0.30    No components found with this basename: POCBNP,  No results found for this basename: DDIMER,  in the last 72 hours No results found for this basename: HGBA1C,  in the last 72 hours No results found for this basename: CHOL, HDL, LDLCALC, TRIG, CHOLHDL, LDLDIRECT,  in the last 72 hours  Recent Labs  05/03/13 0020  TSH 1.092   No results found for this basename: VITAMINB12, FOLATE, FERRITIN, TIBC, IRON, RETICCTPCT,  in the last 72  hours No results found for this basename: LIPASE, AMYLASE,  in the last 72 hours  Urine Studies No results found for this basename: UACOL, UAPR, USPG, UPH, UTP, UGL, UKET, UBIL, UHGB, UNIT, UROB, ULEU, UEPI, UWBC, URBC, UBAC, CAST, CRYS, UCOM, BILUA,  in the last 72 hours  MICROBIOLOGY: Recent Results (from the past 240 hour(s))  RAPID STREP SCREEN     Status: None   Collection Time    05/02/13  6:10 PM      Result Value Range Status   Streptococcus, Group A Screen (Direct) NEGATIVE  NEGATIVE Final   Comment: (NOTE)     A Rapid Antigen test may result negative if the antigen level in the     sample is below the detection level of this test. The FDA has not     cleared this test as a stand-alone test therefore the rapid antigen     negative result has reflexed to a Group A Strep culture.  CULTURE, GROUP A STREP     Status: None   Collection Time    05/02/13  6:10 PM      Result Value Range Status   Specimen Description THROAT   Final   Special Requests NONE   Final   Culture NO SUSPICIOUS COLONIES, CONTINUING TO HOLD   Final   Report Status PENDING   Incomplete    RADIOLOGY STUDIES/RESULTS: Dg Chest 2 View  05/02/2013   *RADIOLOGY REPORT*  Clinical Data: Chest pain.  CHEST - 2 VIEW  Comparison: Chest x-ray 11/09/2012.  Findings: Lung volumes are normal.  No consolidative airspace disease.  No pleural effusions.  No pneumothorax.  No pulmonary nodule or mass noted.  Pulmonary vasculature and the cardiomediastinal silhouette are within normal limits. Atherosclerosis in the thoracic aorta.  Surgical clips project over the right upper quadrant of the abdomen, likely from prior cholecystectomy.  IMPRESSION: 1. No radiographic evidence of acute cardiopulmonary disease. 2.  Atherosclerosis.   Original Report Authenticated By: Trudie Reed, M.D.   Ct Angio Chest Pe W/cm &/or Wo Cm  05/02/2013   *RADIOLOGY REPORT*  Clinical Data: Intermittent sharp chest pain at both sides of the chest;  shortness of breath, nausea and lightheadedness.  CT ANGIOGRAPHY CHEST  Technique:  Multidetector CT imaging of the chest using the standard protocol during bolus administration of intravenous contrast. Multiplanar reconstructed images including MIPs were obtained and reviewed to evaluate the vascular anatomy.  Contrast: OMNIPAQUE IOHEXOL 350 MG/ML SOLN  Comparison: Chest radiograph performed earlier today at 06:40 p.m.  Findings: There is no evidence of pulmonary embolus.  Minimal bibasilar atelectasis is noted, particularly about the descending thoracic aorta.  The lungs are otherwise clear.  There is no evidence of significant focal consolidation, pleural effusion or pneumothorax.  No masses are identified; no abnormal focal contrast enhancement is seen.  The mediastinum is unremarkable in appearance.  No  mediastinal lymphadenopathy is seen.  Scattered high-density material is noted within the esophagus and stomach.  No pericardial effusion is identified.  The great vessels are grossly unremarkable in appearance.  No axillary lymphadenopathy is seen.  The visualized portions of the thyroid gland are unremarkable in appearance.  The visualized portions of the liver and spleen are unremarkable.  No acute osseous abnormalities are seen.  Prominent bridging anterior osteophytes are seen along the lower thoracic spine, compatible with DISH.  IMPRESSION:  1.  No evidence of pulmonary embolus. 2.  Minimal bibasilar atelectasis noted; lungs otherwise clear. 3.  Findings of DISH incidentally noted along the lower thoracic spine.   Original Report Authenticated By: Tonia Ghent, M.D.    Jeoffrey Massed, MD  [facility] Pager:[phone]  If 7PM-7AM, please contact night-coverage [email or web address] Password [email or web address] 05/04/2013, 8:30 AM   LOS: 2 days

## 2013-05-05 DIAGNOSIS — R079 Chest pain, unspecified: Secondary | ICD-10-CM | POA: Diagnosis not present

## 2013-05-05 DIAGNOSIS — I10 Essential (primary) hypertension: Secondary | ICD-10-CM | POA: Diagnosis not present

## 2013-05-05 DIAGNOSIS — R6510 Systemic inflammatory response syndrome (SIRS) of non-infectious origin without acute organ dysfunction: Secondary | ICD-10-CM | POA: Diagnosis not present

## 2013-05-05 LAB — HIV ANTIBODY (ROUTINE TESTING W REFLEX): HIV: NONREACTIVE

## 2013-05-05 LAB — CBC
HCT: 34.1 % — ABNORMAL LOW (ref 36.0–46.0)
Hemoglobin: 11.5 g/dL — ABNORMAL LOW (ref 12.0–15.0)
MCH: 27.6 pg (ref 26.0–34.0)
MCHC: 33.7 g/dL (ref 30.0–36.0)
MCV: 82 fL (ref 78.0–100.0)
Platelets: 201 10*3/uL (ref 150–400)
RBC: 4.16 MIL/uL (ref 3.87–5.11)
RDW: 14.8 % (ref 11.5–15.5)
WBC: 11.5 10*3/uL — ABNORMAL HIGH (ref 4.0–10.5)

## 2013-05-05 LAB — DIFFERENTIAL
Basophils Absolute: 0 10*3/uL (ref 0.0–0.1)
Basophils Relative: 0 % (ref 0–1)
Eosinophils Absolute: 0.1 10*3/uL (ref 0.0–0.7)
Eosinophils Relative: 1 % (ref 0–5)
Lymphocytes Relative: 19 % (ref 12–46)
Lymphs Abs: 2.2 10*3/uL (ref 0.7–4.0)
Monocytes Absolute: 1.2 10*3/uL — ABNORMAL HIGH (ref 0.1–1.0)
Monocytes Relative: 10 % (ref 3–12)
Neutro Abs: 8 10*3/uL — ABNORMAL HIGH (ref 1.7–7.7)
Neutrophils Relative %: 70 % (ref 43–77)

## 2013-05-05 MED ORDER — ASPIRIN 81 MG PO TBEC: 81.0000 mg | DELAYED_RELEASE_TABLET | Freq: Every day | ORAL | Status: DC

## 2013-05-05 MED ORDER — TRAMADOL HCL 50 MG PO TABS: 50.0000 mg | ORAL_TABLET | Freq: Three times a day (TID) | ORAL | Status: DC | PRN

## 2013-05-05 MED ORDER — MENTHOL 3 MG MT LOZG
1.0000 | LOZENGE | OROMUCOSAL | Status: DC | PRN
  Filled 2013-05-05: qty 9

## 2013-05-05 MED ORDER — OXYCODONE-ACETAMINOPHEN 5-325 MG PO TABS: 1.0000 | ORAL_TABLET | Freq: Three times a day (TID) | ORAL | Status: DC | PRN

## 2013-05-05 MED ORDER — SALINE SPRAY 0.65 % NA SOLN: 1.0000 | NASAL | Status: DC | PRN

## 2013-05-05 MED ORDER — AZITHROMYCIN 250 MG PO TABS: ORAL_TABLET | ORAL | Status: DC

## 2013-05-05 MED ORDER — MENTHOL 3 MG MT LOZG: 1.0000 | LOZENGE | OROMUCOSAL | Status: DC | PRN

## 2013-05-05 NOTE — Discharge Summary (Addendum)
PATIENT DETAILS Name: Heather Walker Age: 52 y.o. Sex: female Date of Birth: 06-19-1961 MRN: [id number]. Admit Date: 05/02/2013 Admitting Physician: Lynden Oxford, MD QMV:[id number] Laury Axon, DO  Recommendations for Outpatient Follow-up:  1. Please follow HIV test-pending at time of discharge 2. Please follow Blood cultures till final-neg at the time of discharge 3. Please follow Group A strep cultures-still pending at the time of discharge 4. Has suspected mild reactive cervical lymphadenopathy, need to follow till resolution-otherwise may need further work up  PRIMARY DISCHARGE DIAGNOSIS:  Principal Problem:   CHEST PAIN Active Problems:   HYPERTENSION   ASTHMA   Tachycardia    SIR's      PAST MEDICAL HISTORY: Past Medical History  Diagnosis Date  . Hypertension   . Depression   . Asthma   . Arthritis   . Heart murmur     DISCHARGE MEDICATIONS:   Medication List         albuterol 108 (90 BASE) MCG/ACT inhaler  Commonly known as:  PROVENTIL HFA;VENTOLIN HFA  Inhale 2 puffs into the lungs every 6 (six) hours as needed. For shortness of breath.     aspirin 81 MG EC tablet  Take 1 tablet (81 mg total) by mouth daily.     azithromycin 250 MG tablet  Commonly known as:  ZITHROMAX Z-PAK  Take 2 tablets daily for one day, and then 1 tablet daily for 4 days     cloNIDine 0.1 MG tablet  Commonly known as:  CATAPRES  Take 1 tablet (0.1 mg total) by mouth 2 (two) times daily.     menthol-cetylpyridinium 3 MG lozenge  Commonly known as:  CEPACOL  Take 1 lozenge (3 mg total) by mouth as needed.     PAIN RELIEVER PO  Take 2 tablets by mouth daily.     sodium chloride 0.65 % Soln nasal spray  Commonly known as:  OCEAN  Place 1 spray into the nose as needed for congestion.     traMADol 50 MG tablet  Commonly known as:  ULTRAM  Take 1 tablet (50 mg total) by mouth every 8 (eight) hours as needed for pain.     triamterene-hydrochlorothiazide 75-50 MG per tablet  Commonly  known as:  MAXZIDE  Take 1 tablet by mouth daily.        ALLERGIES:   Allergies  Allergen Reactions  . Codeine Itching    BRIEF HPI:  See H&P, Labs, Consult and Test reports for all details in brief, patient is a 52 year old AAF with hx of HTN, who was admitted for bilateral chest pain, headache, tachycardia and fever. Her symptoms were ongoing for almost one week prior to admission.  CONSULTATIONS:   None  PERTINENT RADIOLOGIC STUDIES: Dg Sinuses Complete  05/04/2013   *RADIOLOGY REPORT*  Clinical Data: Headache, sinus pressure  PARANASAL SINUSES - COMPLETE 3 + VIEW  Comparison: None.  Findings: Frontal and maxillary sinuses appear clear.  No abnormal opacity.  No air-fluid level.  IMPRESSION: No evidence of sinusitis radiographically.  If indicated, consider CT of the sinuses for more detailed evaluation.   Original Report Authenticated By: Esperanza Heir, M.D.   Dg Chest 2 View  05/02/2013   *RADIOLOGY REPORT*  Clinical Data: Chest pain.  CHEST - 2 VIEW  Comparison: Chest x-ray 11/09/2012.  Findings: Lung volumes are normal.  No consolidative airspace disease.  No pleural effusions.  No pneumothorax.  No pulmonary nodule or mass noted.  Pulmonary vasculature and the cardiomediastinal silhouette are  within normal limits. Atherosclerosis in the thoracic aorta.  Surgical clips project over the right upper quadrant of the abdomen, likely from prior cholecystectomy.  IMPRESSION: 1. No radiographic evidence of acute cardiopulmonary disease. 2.  Atherosclerosis.   Original Report Authenticated By: Trudie Reed, M.D.   Ct Angio Chest Pe W/cm &/or Wo Cm  05/02/2013   *RADIOLOGY REPORT*  Clinical Data: Intermittent sharp chest pain at both sides of the chest; shortness of breath, nausea and lightheadedness.  CT ANGIOGRAPHY CHEST  Technique:  Multidetector CT imaging of the chest using the standard protocol during bolus administration of intravenous contrast. Multiplanar reconstructed images  including MIPs were obtained and reviewed to evaluate the vascular anatomy.  Contrast: OMNIPAQUE IOHEXOL 350 MG/ML SOLN  Comparison: Chest radiograph performed earlier today at 06:40 p.m.  Findings: There is no evidence of pulmonary embolus.  Minimal bibasilar atelectasis is noted, particularly about the descending thoracic aorta.  The lungs are otherwise clear.  There is no evidence of significant focal consolidation, pleural effusion or pneumothorax.  No masses are identified; no abnormal focal contrast enhancement is seen.  The mediastinum is unremarkable in appearance.  No mediastinal lymphadenopathy is seen.  Scattered high-density material is noted within the esophagus and stomach.  No pericardial effusion is identified.  The great vessels are grossly unremarkable in appearance.  No axillary lymphadenopathy is seen.  The visualized portions of the thyroid gland are unremarkable in appearance.  The visualized portions of the liver and spleen are unremarkable.  No acute osseous abnormalities are seen.  Prominent bridging anterior osteophytes are seen along the lower thoracic spine, compatible with DISH.  IMPRESSION:  1.  No evidence of pulmonary embolus. 2.  Minimal bibasilar atelectasis noted; lungs otherwise clear. 3.  Findings of DISH incidentally noted along the lower thoracic spine.   Original Report Authenticated By: Tonia Ghent, M.D.   Ct Maxillofacial Wo Cm  05/04/2013   *RADIOLOGY REPORT*  Clinical Data: Frontal and maxillary region facial pressure. Drainage in the back of the throat.  CT MAXILLOFACIAL WITHOUT CONTRAST  Technique:  Multidetector CT imaging of the maxillofacial structures was performed. Multiplanar CT image reconstructions were also generated.  Comparison: None.  Findings: Mild mucosal thickening lines many of the ethmoid air cells and extends along the right frontoethmoidal recess.  There is mild mucosal thickening along the ostia of the ostiomeatal complexes.  This combines  with bilateral low lying ethmoid air or Haller cells.  This causes mild anatomic narrowing of the ostiomeatal complexes.  There is minimal right nasal septal deviation.  There are no areas of bone sclerosis or resorption. Minimal mucosal thickening is noted along the posterior right maxillary sinus wall.  The sphenoid sinuses show minor anterior mucosal thickening.  The mastoid air cells and middle ear cavities are clear.  The structures of the skull base are unremarkable.  The globes and orbits are normal.  There are prominent bilateral peri jugular lymph nodes.  The largest is a right level II lymph node measuring 16 mm in short axis.  These are nonspecific but most likely reactive.  However, a lymphoproliferative disorder including lymphoma should be considered in the proper clinical setting.  IMPRESSION: Minor sinus mucosal thickening.  No sinus opacification or air- fluid levels.  Mild anatomic narrowing of the ostiomeatal complexes.  Prominent mildly enlarged neck lymph nodes as described.  Consider a lymphoproliferative disorder although these are most likely reactive.   Original Report Authenticated By: Amie Portland, M.D.     PERTINENT LAB  RESULTS: CBC:  Recent Labs  05/04/13 0852 05/05/13 0534  WBC 14.0* 11.5*  HGB 12.1 11.5*  HCT 34.5* 34.1*  PLT 191 201   CMET CMP     Component Value Date/Time   NA 135 05/04/2013 0852   K 3.4* 05/04/2013 0852   CL 99 05/04/2013 0852   CO2 27 05/04/2013 0852   GLUCOSE 187* 05/04/2013 0852   BUN 7 05/04/2013 0852   CREATININE 0.73 05/04/2013 0852   CALCIUM 9.2 05/04/2013 0852   PROT 7.2 07/20/2012 1200   ALBUMIN 3.6 07/20/2012 1200   AST 29 07/20/2012 1200   ALT 28 07/20/2012 1200   ALKPHOS 94 07/20/2012 1200   BILITOT 0.6 07/20/2012 1200   GFRNONAA >90 05/04/2013 0852   GFRAA >90 05/04/2013 0852    GFR Estimated Creatinine Clearance: 100.9 ml/min (by C-G formula based on Cr of 0.73). No results found for this basename: LIPASE, AMYLASE,  in the last  72 hours  Recent Labs  05/02/13 2349 05/03/13 0548 05/03/13 0825  TROPONINI <0.30 <0.30 <0.30   No components found with this basename: POCBNP,  No results found for this basename: DDIMER,  in the last 72 hours No results found for this basename: HGBA1C,  in the last 72 hours No results found for this basename: CHOL, HDL, LDLCALC, TRIG, CHOLHDL, LDLDIRECT,  in the last 72 hours  Recent Labs  05/03/13 0020  TSH 1.092   No results found for this basename: VITAMINB12, FOLATE, FERRITIN, TIBC, IRON, RETICCTPCT,  in the last 72 hours Coags: No results found for this basename: PT, INR,  in the last 72 hours Microbiology: Recent Results (from the past 240 hour(s))  RAPID STREP SCREEN     Status: None   Collection Time    05/02/13  6:10 PM      Result Value Range Status   Streptococcus, Group A Screen (Direct) NEGATIVE  NEGATIVE Final   Comment: (NOTE)     A Rapid Antigen test may result negative if the antigen level in the     sample is below the detection level of this test. The FDA has not     cleared this test as a stand-alone test therefore the rapid antigen     negative result has reflexed to a Group A Strep culture.  CULTURE, GROUP A STREP     Status: None   Collection Time    05/02/13  6:10 PM      Result Value Range Status   Specimen Description THROAT   Final   Special Requests NONE   Final   Culture Culture reincubated for better growth   Final   Report Status PENDING   Incomplete  CULTURE, BLOOD (ROUTINE X 2)     Status: None   Collection Time    05/03/13  5:48 AM      Result Value Range Status   Specimen Description BLOOD LEFT ARM   Final   Special Requests BOTTLES DRAWN AEROBIC ONLY 10CC   Final   Culture  Setup Time 05/03/2013 09:44   Final   Culture     Final   Value:        BLOOD CULTURE RECEIVED NO GROWTH TO DATE CULTURE WILL BE HELD FOR 5 DAYS BEFORE ISSUING A FINAL NEGATIVE REPORT   Report Status PENDING   Incomplete  CULTURE, BLOOD (ROUTINE X 2)      Status: None   Collection Time    05/03/13  5:55 AM  Result Value Range Status   Specimen Description BLOOD LEFT HAND   Final   Special Requests BOTTLES DRAWN AEROBIC ONLY 8CC   Final   Culture  Setup Time 05/03/2013 09:43   Final   Culture     Final   Value:        BLOOD CULTURE RECEIVED NO GROWTH TO DATE CULTURE WILL BE HELD FOR 5 DAYS BEFORE ISSUING A FINAL NEGATIVE REPORT   Report Status PENDING   Incomplete     BRIEF HOSPITAL COURSE:   Principal Problem:   Chest pain -this was bilateral, and in the context of fever, sore throat, headaches-hence felt to be atypical. She was admitted to the telemetry unit, and troponin's were cycled which was negative. CTA Chest was done which was neg for Pul Embolism and PNA. UA was neg for UTI, Blood cultures were drawn on 7/18-which are negative to date. 2 D Echo was done-which was of poor quality, however neg showed normal EF. Chest pain has now resolved. She is considered stable for discharge, will defer further cardiac work up to her PCP in the outpatient setting.We will discharge on her 81 mg of ASA.  Active Problems: SIR's -on admission-patient had evidence of sinus tachycardia and fever. Did have leukocytosis of a peak of 14 K this admission, which spontaneously (i.e without antibiotics) decreased to 11.5K by the day of discharge. As noted above, she did undergo a work up during her hospital stay that was mostly negative. CTA Chest/CXR neg for PNA, Blood cultures neg so far, UA not consistent with UTI. Neck was supple on exam.She complained of some sinus pressure-as a result a Xray of the Sinuses was done, which did not show a air-fluid level, this was followed by a CT Sinus which neg for sinusitis, however did show mild cervical lymphadenopathy which is suspected to be reactive. This morning her major complaint is a sorethroat that has worsened since yesterday, she also has some hoarseness to her voice. On exam, she does have mild erythema in her  throat on exam, with no exudates. Rapid strep screen on admission was neg, a Group B Strep culture is still pending. She now has been afebrile for 48 hours, tachycardia has resolved, culture data neg to date, therefore it is felt that he illness is most consistent with a viral syndrome, and that she has met maximal benefit from this hospitalization and is considered stable for discharge with close outpatient follow up with her PCP. She was monitored off antibiotics during her stay here, however given worsening of her sorethroat-I have elected to start her on a trial of Z Pack, this was discussed with the patient and her husband at bedside, who are in agreement with this plan. -At this time-her symptoms are much better than on admission, and I suspect this is mostly viral in nature. -I have asked her to follow up with her PCP in the next 2-3 days for close outpatient monitoring, where pending culture data can be followed. She and her husband are agreeable with this as well. -If her symptoms still persists-then ID eval can be considered in the outpatient setting. -Patient has been asked to return to the ED or seek medical attention if she were to develop fever/worsening headaches, chest pain or Shortness of breath  HYPERTENSION  -controlled with Clonidine and HCTZ/Triamterene   Hypokalemia  -replete and recheck in am  -suspect 2/2 diuretics   ASTHMA  -stable  -lungs clear   Tachycardia  -sinus  -2/2  SIRS  -resolved with IVF  -CTA Chest neg for PE, Echo-preserved LV   TODAY-DAY OF DISCHARGE:  Subjective:   [name] today has very minimal headache,no chest abdominal pain,no new weakness tingling or numbness, feels much better wants to go home today. She does have a sorethroat with hoarseness  Objective:   Blood pressure 120/70, pulse 74, temperature 97.5 F (36.4 C), temperature source Oral, resp. rate 18, height 5\' 4"  (1.626 m), weight 108.047 kg (238 lb 3.2 oz), SpO2  99.00%.  Intake/Output Summary (Last 24 hours) at 05/05/13 1024 Last data filed at 05/05/13 0900  Gross per 24 hour  Intake    600 ml  Output      1 ml  Net    599 ml   Filed Weights   05/03/13 0237 05/04/13 0400  Weight: 101.833 kg (224 lb 8 oz) 108.047 kg (238 lb 3.2 oz)    Exam Awake Alert, Oriented *3, No new F.N deficits, Normal affect [place].AT,PERRAL Supple Neck,No JVD Symmetrical Chest wall movement, Good air movement bilaterally, CTAB RRR,No Gallops,Rubs or new Murmurs, No Parasternal Heave +ve B.Sounds, Abd Soft, Non tender, No organomegaly appriciated, No rebound -guarding or rigidity. No Cyanosis, Clubbing or edema, No new Rash or bruise  DISCHARGE CONDITION: Stable  DISPOSITION: Home  DISCHARGE INSTRUCTIONS:    Activity:  As tolerated   Diet recommendation: Regular Diet  Discharge Orders   Future Orders Complete By Expires     Call MD for:  difficulty breathing, headache or visual disturbances  As directed     Call MD for:  severe uncontrolled pain  As directed     Call MD for:  temperature >100.4  As directed     Diet - low sodium heart healthy  As directed     Increase activity slowly  As directed        Follow-up Information   Follow up with Loreen Freud, DO. Schedule an appointment as soon as possible for a visit in 2 days.   Contact information:   [address] [facility] [address] [place] [address] [phone]      Total Time spent on discharge equals 45 minutes.  SignedJeoffrey Massed 05/05/2013 10:24 AM

## 2013-05-05 NOTE — Progress Notes (Signed)
PATIENT DETAILS Name: Heather Walker Age: 52 y.o. Sex: female Date of Birth: August 26, 1961 Admit Date: 05/02/2013 Admitting Physician Lynden Oxford, MD FAO:[id number] Laury Axon, DO  Subjective: Continues to improve-Headache only minimal today-mostly in the right side. Afebrile for 48 hours now. She now has worsening of her sorethroat and her voice is slightly hoarse. She also has mild difficulty in swallowing due to sore throat-but was able to finish her breakfast tray.   Assessment/Plan: Principal Problem:   CHEST PAIN -this has competely resolved, no further chest pain -was bilateral-doubt cardiac etiology -CTA chest neg for PE -Echo-shows preserved EF with no obvious wall motion abnormality -monitor in Telemetry -troponins neg  Active Problems: SIR's -etiology/foci of infection remains uncertain-suspect this is viral-given fever/sorethroat/resolving leukocytosis/chest pain/headache.  -No PNA seen on CT Chest or CXR, UA neg for UTI. Claims her sinuses are congested-k Xray of her sinus neg, subsequently CT Sinus was done-does not show any sinusitis, does show mild cervical lymphadenopathy-suspect reactive to the pharyngitis/sore throat.  -blood cultures7/18-neg -Rapid Strep screen neg ON 7/17 -Group A Strep Culture (throat)-pending -will try a trial of Z Pack of discharge with lozenges -since afebrile, and slowly improving-all culture data neg-suspect she can be discharged with close outpatient follow up with her PCP in the next few days.    HYPERTENSION -controlled with Clonidine and HCTZ/Triamterene  Hypokalemia -repleted -suspect 2/2 diuretics    ASTHMA -stable -lungs clear on exam this am    Tachycardia -sinus -2/2 SIRS -resolved with IVF -CTA Chest neg for PE, Echo-preserved LV, TSH within normal limits  Disposition: home today  DVT Prophylaxis: Prophylactic Lovenox   Code Status: Full code   Procedures:  None  CONSULTS:  None   MEDICATIONS: Scheduled  Meds: . aspirin EC  325 mg Oral Daily  . cloNIDine  0.1 mg Oral BID  . enoxaparin (LOVENOX) injection  40 mg Subcutaneous Q24H  . polyethylene glycol  17 g Oral BID  . senna  2 tablet Oral QHS  . triamterene-hydrochlorothiazide  1 tablet Oral Daily   Continuous Infusions:   PRN Meds:.acetaminophen, albuterol, ketorolac, nitroGLYCERIN, ondansetron (ZOFRAN) IV, oxyCODONE-acetaminophen, sodium chloride  Antibiotics: Anti-infectives   None       PHYSICAL EXAM: Vital signs in last 24 hours: Filed Vitals:   05/04/13 1014 05/04/13 1309 05/04/13 2100 05/05/13 0500  BP: 150/90 136/87 135/89 118/78  Pulse:  89 81 74  Temp:  97.8 F (36.6 C) 98.7 F (37.1 C) 97.5 F (36.4 C)  TempSrc:  Oral    Resp:  17 20 18   Height:      Weight:      SpO2:  96% 100% 99%    Weight change:  Filed Weights   05/03/13 0237 05/04/13 0400  Weight: 101.833 kg (224 lb 8 oz) 108.047 kg (238 lb 3.2 oz)   Body mass index is 40.87 kg/(m^2).   Gen Exam: Awake and alert with clear speech.   Neck: Supple, No JVD.  Throat-mild erythema without any visible tonsillar exudates. Chest: B/L Clear.   CVS: S1 S2 Regular, no murmurs.  Abdomen: soft, BS +, non tender, non distended.  Extremities: no edema, lower extremities warm to touch. Neurologic: Non Focal.   Skin: No Rash.  Wounds: N/A.    Intake/Output from previous day:  Intake/Output Summary (Last 24 hours) at 05/05/13 0925 Last data filed at 05/04/13 2100  Gross per 24 hour  Intake    360 ml  Output      1 ml  Net  359 ml     LAB RESULTS: CBC  Recent Labs Lab 05/02/13 1806 05/03/13 0835 05/04/13 0852 05/05/13 0534  WBC 8.3 11.1* 14.0* 11.5*  HGB 13.9 11.5* 12.1 11.5*  HCT 39.6 33.2* 34.5* 34.1*  PLT 249 207 191 201  MCV 81.8 81.8 81.0 82.0  MCH 28.7 28.3 28.4 27.6  MCHC 35.1 34.6 35.1 33.7  RDW 14.6 14.9 14.8 14.8  LYMPHSABS  --   --  1.4 2.2  MONOABS  --   --  1.3* 1.2*  EOSABS  --   --  0.0 0.1  BASOSABS  --   --  0.0 0.0     Chemistries   Recent Labs Lab 05/02/13 1806 05/03/13 0835 05/04/13 0852  NA 140 138 135  K 3.1* 3.1* 3.4*  CL 100 103 99  CO2 31 26 27   GLUCOSE 137* 128* 187*  BUN 10 7 7   CREATININE 0.67 0.70 0.73  CALCIUM 9.8 8.2* 9.2    CBG: No results found for this basename: GLUCAP,  in the last 168 hours  GFR Estimated Creatinine Clearance: 100.9 ml/min (by C-G formula based on Cr of 0.73).  Coagulation profile No results found for this basename: INR, PROTIME,  in the last 168 hours  Cardiac Enzymes  Recent Labs Lab 05/02/13 2349 05/03/13 0548 05/03/13 0825  TROPONINI <0.30 <0.30 <0.30    No components found with this basename: POCBNP,  No results found for this basename: DDIMER,  in the last 72 hours No results found for this basename: HGBA1C,  in the last 72 hours No results found for this basename: CHOL, HDL, LDLCALC, TRIG, CHOLHDL, LDLDIRECT,  in the last 72 hours  Recent Labs  05/03/13 0020  TSH 1.092   No results found for this basename: VITAMINB12, FOLATE, FERRITIN, TIBC, IRON, RETICCTPCT,  in the last 72 hours No results found for this basename: LIPASE, AMYLASE,  in the last 72 hours  Urine Studies No results found for this basename: UACOL, UAPR, USPG, UPH, UTP, UGL, UKET, UBIL, UHGB, UNIT, UROB, ULEU, UEPI, UWBC, URBC, UBAC, CAST, CRYS, UCOM, BILUA,  in the last 72 hours  MICROBIOLOGY: Recent Results (from the past 240 hour(s))  RAPID STREP SCREEN     Status: None   Collection Time    05/02/13  6:10 PM      Result Value Range Status   Streptococcus, Group A Screen (Direct) NEGATIVE  NEGATIVE Final   Comment: (NOTE)     A Rapid Antigen test may result negative if the antigen level in the     sample is below the detection level of this test. The FDA has not     cleared this test as a stand-alone test therefore the rapid antigen     negative result has reflexed to a Group A Strep culture.  CULTURE, GROUP A STREP     Status: None   Collection Time     05/02/13  6:10 PM      Result Value Range Status   Specimen Description THROAT   Final   Special Requests NONE   Final   Culture Culture reincubated for better growth   Final   Report Status PENDING   Incomplete  CULTURE, BLOOD (ROUTINE X 2)     Status: None   Collection Time    05/03/13  5:48 AM      Result Value Range Status   Specimen Description BLOOD LEFT ARM   Final   Special Requests BOTTLES DRAWN AEROBIC ONLY 10CC  Final   Culture  Setup Time 05/03/2013 09:44   Final   Culture     Final   Value:        BLOOD CULTURE RECEIVED NO GROWTH TO DATE CULTURE WILL BE HELD FOR 5 DAYS BEFORE ISSUING A FINAL NEGATIVE REPORT   Report Status PENDING   Incomplete  CULTURE, BLOOD (ROUTINE X 2)     Status: None   Collection Time    05/03/13  5:55 AM      Result Value Range Status   Specimen Description BLOOD LEFT HAND   Final   Special Requests BOTTLES DRAWN AEROBIC ONLY 8CC   Final   Culture  Setup Time 05/03/2013 09:43   Final   Culture     Final   Value:        BLOOD CULTURE RECEIVED NO GROWTH TO DATE CULTURE WILL BE HELD FOR 5 DAYS BEFORE ISSUING A FINAL NEGATIVE REPORT   Report Status PENDING   Incomplete    RADIOLOGY STUDIES/RESULTS: Dg Chest 2 View  05/02/2013   *RADIOLOGY REPORT*  Clinical Data: Chest pain.  CHEST - 2 VIEW  Comparison: Chest x-ray 11/09/2012.  Findings: Lung volumes are normal.  No consolidative airspace disease.  No pleural effusions.  No pneumothorax.  No pulmonary nodule or mass noted.  Pulmonary vasculature and the cardiomediastinal silhouette are within normal limits. Atherosclerosis in the thoracic aorta.  Surgical clips project over the right upper quadrant of the abdomen, likely from prior cholecystectomy.  IMPRESSION: 1. No radiographic evidence of acute cardiopulmonary disease. 2.  Atherosclerosis.   Original Report Authenticated By: Trudie Reed, M.D.   Ct Angio Chest Pe W/cm &/or Wo Cm  05/02/2013   *RADIOLOGY REPORT*  Clinical Data: Intermittent sharp  chest pain at both sides of the chest; shortness of breath, nausea and lightheadedness.  CT ANGIOGRAPHY CHEST  Technique:  Multidetector CT imaging of the chest using the standard protocol during bolus administration of intravenous contrast. Multiplanar reconstructed images including MIPs were obtained and reviewed to evaluate the vascular anatomy.  Contrast: OMNIPAQUE IOHEXOL 350 MG/ML SOLN  Comparison: Chest radiograph performed earlier today at 06:40 p.m.  Findings: There is no evidence of pulmonary embolus.  Minimal bibasilar atelectasis is noted, particularly about the descending thoracic aorta.  The lungs are otherwise clear.  There is no evidence of significant focal consolidation, pleural effusion or pneumothorax.  No masses are identified; no abnormal focal contrast enhancement is seen.  The mediastinum is unremarkable in appearance.  No mediastinal lymphadenopathy is seen.  Scattered high-density material is noted within the esophagus and stomach.  No pericardial effusion is identified.  The great vessels are grossly unremarkable in appearance.  No axillary lymphadenopathy is seen.  The visualized portions of the thyroid gland are unremarkable in appearance.  The visualized portions of the liver and spleen are unremarkable.  No acute osseous abnormalities are seen.  Prominent bridging anterior osteophytes are seen along the lower thoracic spine, compatible with DISH.  IMPRESSION:  1.  No evidence of pulmonary embolus. 2.  Minimal bibasilar atelectasis noted; lungs otherwise clear. 3.  Findings of DISH incidentally noted along the lower thoracic spine.   Original Report Authenticated By: Tonia Ghent, M.D.    Jeoffrey Massed, MD  [facility] Pager:[phone]  If 7PM-7AM, please contact night-coverage [email or web address] Password [email or web address] 05/05/2013, 9:25 AM   LOS: 3 days

## 2013-05-06 LAB — CULTURE, GROUP A STREP

## 2013-05-07 NOTE — ED Provider Notes (Signed)
Medical screening examination/treatment/procedure(s) were performed by non-physician practitioner and as supervising physician I was immediately available for consultation/collaboration.   Ashby Dawes, MD 05/07/13 [phone]

## 2013-05-09 LAB — CULTURE, BLOOD (ROUTINE X 2)
Culture: NO GROWTH
Culture: NO GROWTH

## 2013-05-30 ENCOUNTER — Ambulatory Visit (INDEPENDENT_AMBULATORY_CARE_PROVIDER_SITE_OTHER): Payer: Medicare Other | Admitting: Family Medicine

## 2013-05-30 ENCOUNTER — Encounter: Payer: Self-pay | Admitting: Family Medicine

## 2013-05-30 VITALS — BP 148/86 | HR 77 | Temp 98.6°F | Wt 224.4 lb

## 2013-05-30 DIAGNOSIS — I10 Essential (primary) hypertension: Secondary | ICD-10-CM | POA: Diagnosis not present

## 2013-05-30 DIAGNOSIS — J45909 Unspecified asthma, uncomplicated: Secondary | ICD-10-CM | POA: Diagnosis not present

## 2013-05-30 MED ORDER — ALBUTEROL SULFATE HFA 108 (90 BASE) MCG/ACT IN AERS: 2.0000 | INHALATION_SPRAY | Freq: Four times a day (QID) | RESPIRATORY_TRACT | Status: DC | PRN

## 2013-05-30 MED ORDER — CLONIDINE HCL 0.2 MG PO TABS: 0.2000 mg | ORAL_TABLET | Freq: Two times a day (BID) | ORAL | Status: DC

## 2013-05-30 NOTE — Patient Instructions (Addendum)

## 2013-05-31 LAB — CBC WITH DIFFERENTIAL/PLATELET
Basophils Absolute: 0 10*3/uL (ref 0.0–0.1)
Basophils Relative: 0.3 % (ref 0.0–3.0)
Eosinophils Absolute: 0.1 10*3/uL (ref 0.0–0.7)
Eosinophils Relative: 2.8 % (ref 0.0–5.0)
HCT: 36 % (ref 36.0–46.0)
Hemoglobin: 12.2 g/dL (ref 12.0–15.0)
Lymphocytes Relative: 47.8 % — ABNORMAL HIGH (ref 12.0–46.0)
Lymphs Abs: 2.3 10*3/uL (ref 0.7–4.0)
MCHC: 33.7 g/dL (ref 30.0–36.0)
MCV: 84.2 fl (ref 78.0–100.0)
Monocytes Absolute: 0.5 10*3/uL (ref 0.1–1.0)
Monocytes Relative: 10.5 % (ref 3.0–12.0)
Neutro Abs: 1.9 10*3/uL (ref 1.4–7.7)
Neutrophils Relative %: 38.6 % — ABNORMAL LOW (ref 43.0–77.0)
Platelets: 226 10*3/uL (ref 150.0–400.0)
RBC: 4.28 Mil/uL (ref 3.87–5.11)
RDW: 16.1 % — ABNORMAL HIGH (ref 11.5–14.6)
WBC: 4.9 10*3/uL (ref 4.5–10.5)

## 2013-05-31 NOTE — Progress Notes (Signed)
  Subjective:    Patient ID: Heather Walker, female    DOB: [birth year], 52 y.o.   MRN: [id number]  HPI Pt here for hospital f/u chest pain and htn.  Pt d/c with clonidine 0.1 bid.  Review of Systems As above    Objective:   Physical Exam BP 148/86  Pulse 77  Temp(Src) 98.6 F (37 C) (Oral)  Wt 224 lb 6.4 oz (101.787 kg)  BMI 38.5 kg/m2  SpO2 95% General appearance: alert, cooperative, appears stated age and no distress Throat: lips, mucosa, and tongue normal; teeth and gums normal Neck: no adenopathy, no carotid bruit, no JVD, supple, symmetrical, trachea midline and thyroid not enlarged, symmetric, no tenderness/mass/nodules Lungs: clear to auscultation bilaterally Heart: S1, S2 normal Extremities: extremities normal, atraumatic, no cyanosis or edema       Assessment & Plan:

## 2013-06-02 NOTE — Assessment & Plan Note (Addendum)
Clonidine inc ---see orders rto 2-3 weeks No further chest pain hosp records reviewed

## 2013-06-14 ENCOUNTER — Ambulatory Visit: Payer: Medicare Other | Admitting: Family Medicine

## 2013-07-08 ENCOUNTER — Other Ambulatory Visit: Payer: Self-pay | Admitting: Family Medicine

## 2013-07-08 ENCOUNTER — Telehealth: Payer: Self-pay | Admitting: General Practice

## 2013-07-08 DIAGNOSIS — M549 Dorsalgia, unspecified: Secondary | ICD-10-CM

## 2013-07-08 MED ORDER — HYDROCODONE-ACETAMINOPHEN 5-325 MG PO TABS: ORAL_TABLET | ORAL | Status: AC

## 2013-07-08 NOTE — Telephone Encounter (Signed)
Refill pain med x1---keep ov

## 2013-07-08 NOTE — Telephone Encounter (Signed)
norco that she had in past

## 2013-07-08 NOTE — Telephone Encounter (Signed)
Med filled and faxed.  

## 2013-07-08 NOTE — Telephone Encounter (Signed)
What medication are we filling?    KP

## 2013-07-08 NOTE — Telephone Encounter (Signed)
Pt called stating that she is having severe pain in her lower back. Wanted a pain medication. Pt was scheduled for an appt on 9/25.

## 2013-07-11 ENCOUNTER — Encounter: Payer: Self-pay | Admitting: Family Medicine

## 2013-07-11 ENCOUNTER — Ambulatory Visit (INDEPENDENT_AMBULATORY_CARE_PROVIDER_SITE_OTHER): Payer: Medicare Other | Admitting: Family Medicine

## 2013-07-11 VITALS — BP 146/88 | HR 76 | Temp 98.2°F | Wt 222.6 lb

## 2013-07-11 DIAGNOSIS — IMO0002 Reserved for concepts with insufficient information to code with codable children: Secondary | ICD-10-CM

## 2013-07-11 DIAGNOSIS — I10 Essential (primary) hypertension: Secondary | ICD-10-CM | POA: Diagnosis not present

## 2013-07-11 DIAGNOSIS — M549 Dorsalgia, unspecified: Secondary | ICD-10-CM

## 2013-07-11 DIAGNOSIS — M545 Low back pain, unspecified: Secondary | ICD-10-CM

## 2013-07-11 MED ORDER — CARVEDILOL 6.25 MG PO TABS: 6.2500 mg | ORAL_TABLET | Freq: Two times a day (BID) | ORAL | Status: DC

## 2013-07-11 NOTE — Patient Instructions (Addendum)

## 2013-07-11 NOTE — Assessment & Plan Note (Signed)
Pt needs to see ortho/ NS but because she has [place] access we have been unable to go.  Pt also now has medicare. Pt is considering going to pcp on her cared so she can go to ortho but really doesn t want to Refill pain meds

## 2013-07-11 NOTE — Progress Notes (Signed)
  Subjective:    Patient here for follow-up of elevated blood pressure.  She is not exercising and is adherent to a low-salt diet.  Blood pressure is not well controlled at home. Cardiac symptoms: none. Patient denies: chest pain, chest pressure/discomfort, claudication, dyspnea, exertional chest pressure/discomfort, fatigue, irregular heart beat, lower extremity edema, near-syncope, orthopnea, palpitations, paroxysmal nocturnal dyspnea, syncope and tachypnea. Cardiovascular risk factors: hypertension, obesity (BMI >= 30 kg/m2) and sedentary lifestyle. Use of agents associated with hypertension: none. History of target organ damage: none. Pt also c/o inc back pain secondary to weather.   No new injury.  The following portions of the patient's history were reviewed and updated as appropriate: allergies, current medications, past family history, past medical history, past social history, past surgical history and problem list.  Review of Systems Pertinent items are noted in HPI.     Objective:    BP 146/88  Pulse 76  Temp(Src) 98.2 F (36.8 C) (Oral)  Wt 222 lb 9.6 oz (100.971 kg)  BMI 38.19 kg/m2  SpO2 96% General appearance: alert, cooperative, appears stated age and no distress Neck: no adenopathy, supple, symmetrical, trachea midline and thyroid not enlarged, symmetric, no tenderness/mass/nodules Lungs: clear to auscultation bilaterally Heart: S1, S2 normal Extremities: edema +1 pitting edema    Assessment:    Hypertension, stage 1 . Evidence of target organ damage: none.    Plan:    Medication: begin coreg 6.25 mg bid . Dietary sodium restriction. Regular aerobic exercise. Check blood pressures 2-3 times weekly and record. Follow up: 3 months and as needed. --or sooner prn

## 2013-07-14 NOTE — Assessment & Plan Note (Signed)
Refill meds Pt needs ortho referral but needs to have her ins changed as well

## 2013-08-02 ENCOUNTER — Encounter (HOSPITAL_COMMUNITY): Payer: Self-pay | Admitting: Emergency Medicine

## 2013-08-02 ENCOUNTER — Emergency Department (HOSPITAL_COMMUNITY)
Admission: EM | Admit: 2013-08-02 | Discharge: 2013-08-02 | Disposition: A | Discharge status: Home / Self Care | Payer: No Typology Code available for payment source | Attending: Emergency Medicine | Admitting: Emergency Medicine

## 2013-08-02 ENCOUNTER — Emergency Department (HOSPITAL_COMMUNITY): Payer: No Typology Code available for payment source

## 2013-08-02 DIAGNOSIS — M549 Dorsalgia, unspecified: Secondary | ICD-10-CM | POA: Diagnosis not present

## 2013-08-02 DIAGNOSIS — M545 Low back pain, unspecified: Secondary | ICD-10-CM | POA: Diagnosis not present

## 2013-08-02 DIAGNOSIS — J45909 Unspecified asthma, uncomplicated: Secondary | ICD-10-CM | POA: Diagnosis not present

## 2013-08-02 DIAGNOSIS — S298XXA Other specified injuries of thorax, initial encounter: Secondary | ICD-10-CM | POA: Insufficient documentation

## 2013-08-02 DIAGNOSIS — IMO0002 Reserved for concepts with insufficient information to code with codable children: Secondary | ICD-10-CM | POA: Diagnosis present

## 2013-08-02 DIAGNOSIS — I10 Essential (primary) hypertension: Secondary | ICD-10-CM | POA: Insufficient documentation

## 2013-08-02 DIAGNOSIS — T148XXA Other injury of unspecified body region, initial encounter: Secondary | ICD-10-CM | POA: Diagnosis not present

## 2013-08-02 DIAGNOSIS — R071 Chest pain on breathing: Secondary | ICD-10-CM | POA: Diagnosis not present

## 2013-08-02 DIAGNOSIS — Y9389 Activity, other specified: Secondary | ICD-10-CM | POA: Diagnosis not present

## 2013-08-02 DIAGNOSIS — Y92410 Unspecified street and highway as the place of occurrence of the external cause: Secondary | ICD-10-CM | POA: Diagnosis not present

## 2013-08-02 DIAGNOSIS — R0789 Other chest pain: Secondary | ICD-10-CM

## 2013-08-02 DIAGNOSIS — M542 Cervicalgia: Secondary | ICD-10-CM | POA: Diagnosis not present

## 2013-08-02 DIAGNOSIS — Z8659 Personal history of other mental and behavioral disorders: Secondary | ICD-10-CM | POA: Insufficient documentation

## 2013-08-02 DIAGNOSIS — R011 Cardiac murmur, unspecified: Secondary | ICD-10-CM | POA: Insufficient documentation

## 2013-08-02 DIAGNOSIS — Z8739 Personal history of other diseases of the musculoskeletal system and connective tissue: Secondary | ICD-10-CM | POA: Insufficient documentation

## 2013-08-02 DIAGNOSIS — R079 Chest pain, unspecified: Secondary | ICD-10-CM | POA: Diagnosis not present

## 2013-08-02 DIAGNOSIS — Z79899 Other long term (current) drug therapy: Secondary | ICD-10-CM | POA: Diagnosis not present

## 2013-08-02 MED ORDER — OXYCODONE-ACETAMINOPHEN 5-325 MG PO TABS: 1.0000 | ORAL_TABLET | ORAL | Status: DC | PRN

## 2013-08-02 MED ORDER — HYDROMORPHONE HCL PF 1 MG/ML IJ SOLN
1.0000 mg | Freq: Once | INTRAMUSCULAR | Status: AC
  Administered 2013-08-02: 1 mg via INTRAMUSCULAR
  Filled 2013-08-02: qty 1

## 2013-08-02 MED ORDER — DIAZEPAM 5 MG PO TABS
5.0000 mg | ORAL_TABLET | Freq: Once | ORAL | Status: AC
  Administered 2013-08-02: 5 mg via ORAL
  Filled 2013-08-02: qty 1

## 2013-08-02 MED ORDER — IBUPROFEN 400 MG PO TABS
600.0000 mg | ORAL_TABLET | Freq: Once | ORAL | Status: AC
  Administered 2013-08-02: 600 mg via ORAL
  Filled 2013-08-02 (×2): qty 1
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
## 2013-08-02 NOTE — ED Notes (Signed)
Discharge instructions reviewed with pt. Pt verbalized understanding.   
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
## 2013-10-04 DIAGNOSIS — H18419 Arcus senilis, unspecified eye: Secondary | ICD-10-CM | POA: Diagnosis not present

## 2013-10-04 DIAGNOSIS — H11159 Pinguecula, unspecified eye: Secondary | ICD-10-CM | POA: Diagnosis not present

## 2013-10-23 ENCOUNTER — Telehealth: Payer: Self-pay | Admitting: Family Medicine

## 2013-10-23 MED ORDER — OXYCODONE-ACETAMINOPHEN 5-325 MG PO TABS: 1.0000 | ORAL_TABLET | ORAL | Status: DC | PRN

## 2013-10-23 NOTE — Telephone Encounter (Signed)
Patient aware Rx ready for pick up.      KP 

## 2013-10-23 NOTE — Telephone Encounter (Signed)
Patient is calling to request a refill on her Oxycodone rx for her back pain. Please advise.

## 2013-10-23 NOTE — Telephone Encounter (Signed)
Last seen 07/11/13 and filled 06/29/12 #60. Patient has been receiving 10-15 pills while in the hospital. Please advise if refill appropriate.      KP

## 2013-10-23 NOTE — Telephone Encounter (Signed)
Refill x1  #60

## 2013-10-31 ENCOUNTER — Telehealth: Payer: Self-pay | Admitting: *Deleted

## 2013-10-31 NOTE — Telephone Encounter (Signed)
I don't think medicaid will pay for any narcotic-- -if she has a formulary or can call them and find out what they will pay for we can write it

## 2013-10-31 NOTE — Telephone Encounter (Signed)
Patient called and stated that medicaid will not pay for the oxycodone. Patient would like to know if anything else could be sent to the pharmacy for her. Please advise. SW

## 2013-10-31 NOTE — Telephone Encounter (Signed)
Patient states that she will contact the insurance company and let us know what is covered.

## 2013-11-08 ENCOUNTER — Other Ambulatory Visit: Payer: Self-pay | Admitting: Family Medicine

## 2013-12-06 ENCOUNTER — Encounter: Payer: Self-pay | Admitting: Family Medicine

## 2014-02-09 ENCOUNTER — Emergency Department (HOSPITAL_COMMUNITY): Payer: Medicare Other

## 2014-02-09 ENCOUNTER — Emergency Department (HOSPITAL_COMMUNITY)
Admission: EM | Admit: 2014-02-09 | Discharge: 2014-02-09 | Disposition: A | Discharge status: Home / Self Care | Payer: Medicare Other | Attending: Emergency Medicine | Admitting: Emergency Medicine

## 2014-02-09 ENCOUNTER — Encounter (HOSPITAL_COMMUNITY): Payer: Self-pay | Admitting: Emergency Medicine

## 2014-02-09 DIAGNOSIS — M549 Dorsalgia, unspecified: Secondary | ICD-10-CM | POA: Insufficient documentation

## 2014-02-09 DIAGNOSIS — I10 Essential (primary) hypertension: Secondary | ICD-10-CM | POA: Insufficient documentation

## 2014-02-09 DIAGNOSIS — Z79899 Other long term (current) drug therapy: Secondary | ICD-10-CM | POA: Insufficient documentation

## 2014-02-09 DIAGNOSIS — R109 Unspecified abdominal pain: Secondary | ICD-10-CM | POA: Insufficient documentation

## 2014-02-09 DIAGNOSIS — F329 Major depressive disorder, single episode, unspecified: Secondary | ICD-10-CM | POA: Insufficient documentation

## 2014-02-09 DIAGNOSIS — F3289 Other specified depressive episodes: Secondary | ICD-10-CM | POA: Insufficient documentation

## 2014-02-09 DIAGNOSIS — R011 Cardiac murmur, unspecified: Secondary | ICD-10-CM | POA: Diagnosis not present

## 2014-02-09 DIAGNOSIS — Z8739 Personal history of other diseases of the musculoskeletal system and connective tissue: Secondary | ICD-10-CM | POA: Diagnosis not present

## 2014-02-09 DIAGNOSIS — J111 Influenza due to unidentified influenza virus with other respiratory manifestations: Secondary | ICD-10-CM

## 2014-02-09 DIAGNOSIS — R0602 Shortness of breath: Secondary | ICD-10-CM | POA: Diagnosis not present

## 2014-02-09 DIAGNOSIS — J45909 Unspecified asthma, uncomplicated: Secondary | ICD-10-CM | POA: Diagnosis not present

## 2014-02-09 DIAGNOSIS — R079 Chest pain, unspecified: Secondary | ICD-10-CM | POA: Diagnosis not present

## 2014-02-09 LAB — URINALYSIS, ROUTINE W REFLEX MICROSCOPIC
Bilirubin Urine: NEGATIVE
Glucose, UA: NEGATIVE mg/dL
Hgb urine dipstick: NEGATIVE
Ketones, ur: NEGATIVE mg/dL
Leukocytes, UA: NEGATIVE
Nitrite: NEGATIVE
Protein, ur: 30 mg/dL — AB
Specific Gravity, Urine: 1.012 (ref 1.005–1.030)
Urobilinogen, UA: 1 mg/dL (ref 0.0–1.0)
pH: 7.5 (ref 5.0–8.0)

## 2014-02-09 LAB — CBC WITH DIFFERENTIAL/PLATELET
Basophils Absolute: 0 10*3/uL (ref 0.0–0.1)
Basophils Relative: 1 % (ref 0–1)
Eosinophils Absolute: 0.1 10*3/uL (ref 0.0–0.7)
Eosinophils Relative: 2 % (ref 0–5)
HCT: 39.6 % (ref 36.0–46.0)
Hemoglobin: 13.5 g/dL (ref 12.0–15.0)
Lymphocytes Relative: 43 % (ref 12–46)
Lymphs Abs: 1.7 10*3/uL (ref 0.7–4.0)
MCH: 28.1 pg (ref 26.0–34.0)
MCHC: 34.1 g/dL (ref 30.0–36.0)
MCV: 82.3 fL (ref 78.0–100.0)
Monocytes Absolute: 0.4 10*3/uL (ref 0.1–1.0)
Monocytes Relative: 10 % (ref 3–12)
Neutro Abs: 1.7 10*3/uL (ref 1.7–7.7)
Neutrophils Relative %: 44 % (ref 43–77)
Platelets: 265 10*3/uL (ref 150–400)
RBC: 4.81 MIL/uL (ref 3.87–5.11)
RDW: 14.5 % (ref 11.5–15.5)
WBC: 3.8 10*3/uL — ABNORMAL LOW (ref 4.0–10.5)

## 2014-02-09 LAB — COMPREHENSIVE METABOLIC PANEL
ALT: 18 U/L (ref 0–35)
AST: 23 U/L (ref 0–37)
Albumin: 3.8 g/dL (ref 3.5–5.2)
Alkaline Phosphatase: 99 U/L (ref 39–117)
BUN: 5 mg/dL — ABNORMAL LOW (ref 6–23)
CO2: 31 mEq/L (ref 19–32)
Calcium: 9.2 mg/dL (ref 8.4–10.5)
Chloride: 96 mEq/L (ref 96–112)
Creatinine, Ser: 0.62 mg/dL (ref 0.50–1.10)
GFR calc Af Amer: >90 mL/min (ref >90)
GFR calc non Af Amer: >90 mL/min (ref >90)
Glucose, Bld: 176 mg/dL — ABNORMAL HIGH (ref 70–99)
Potassium: 3.4 mEq/L — ABNORMAL LOW (ref 3.7–5.3)
Sodium: 140 mEq/L (ref 137–147)
Total Bilirubin: 0.4 mg/dL (ref 0.3–1.2)
Total Protein: 8.3 g/dL (ref 6.0–8.3)

## 2014-02-09 LAB — LIPASE, BLOOD: Lipase: 35 U/L (ref 11–59)

## 2014-02-09 LAB — URINE MICROSCOPIC-ADD ON

## 2014-02-09 MED ORDER — OSELTAMIVIR PHOSPHATE 75 MG PO CAPS: 75.0000 mg | ORAL_CAPSULE | Freq: Two times a day (BID) | ORAL | Status: DC

## 2014-02-09 NOTE — Discharge Instructions (Signed)
Antibiotic Nonuse   Your caregiver felt that the infection or problem was not one that would be helped with an antibiotic.  Infections may be caused by viruses or bacteria. Only a caregiver can tell which one of these is the likely cause of an illness. A cold is the most common cause of infection in both adults and children. A cold is a virus. Antibiotic treatment will have no effect on a viral infection. Viruses can lead to many lost days of work caring for sick children and many missed days of school. Children may catch as many as 10 "colds" or "flus" per year during which they can be tearful, cranky, and uncomfortable. The goal of treating a virus is aimed at keeping the ill person comfortable.  Antibiotics are medications used to help the body fight bacterial infections. There are relatively few types of bacteria that cause infections but there are hundreds of viruses. While both viruses and bacteria cause infection they are very different types of germs. A viral infection will typically go away by itself within 7 to 10 days. Bacterial infections may spread or get worse without antibiotic treatment.  Examples of bacterial infections are:   Sore throats (like strep throat or tonsillitis).   Infection in the lung (pneumonia).   Ear and skin infections.  Examples of viral infections are:   Colds or flus.   Most coughs and bronchitis.   Sore throats not caused by Strep.   Runny noses.  It is often best not to take an antibiotic when a viral infection is the cause of the problem. Antibiotics can kill off the helpful bacteria that we have inside our body and allow harmful bacteria to start growing. Antibiotics can cause side effects such as allergies, nausea, and diarrhea without helping to improve the symptoms of the viral infection. Additionally, repeated uses of antibiotics can cause bacteria inside of our body to become resistant. That resistance can be passed onto harmful bacterial. The next time you have  an infection it may be harder to treat if antibiotics are used when they are not needed. Not treating with antibiotics allows our own immune system to develop and take care of infections more efficiently. Also, antibiotics will work better for us when they are prescribed for bacterial infections.  Treatments for a child that is ill may include:   Give extra fluids throughout the day to stay hydrated.   Get plenty of rest.   Only give your child over-the-counter or prescription medicines for pain, discomfort, or fever as directed by your caregiver.   The use of a cool mist humidifier may help stuffy noses.   Cold medications if suggested by your caregiver.  Your caregiver may decide to start you on an antibiotic if:   The problem you were seen for today continues for a longer length of time than expected.   You develop a secondary bacterial infection.  SEEK MEDICAL CARE IF:   Fever lasts longer than 5 days.   Symptoms continue to get worse after 5 to 7 days or become severe.   Difficulty in breathing develops.   Signs of dehydration develop (poor drinking, rare urinating, dark colored urine).   Changes in behavior or worsening tiredness (listlessness or lethargy).  Document Released: 12/12/2001 Document Revised: 12/26/2011 Document Reviewed: 06/10/2009  [employer] Patient Information 2014 [employer]

## 2014-02-09 NOTE — ED Provider Notes (Signed)
CSN: [id number]     Arrival date & time 02/09/14  1133 History   First MD Initiated Contact with Patient 02/09/14 1214     Chief Complaint  Patient presents with  . Fever  . Abdominal Pain  . Back Pain     HPI Pt presents with complaint of abdominal pain, fever, and headache. Pt states that her abdominal pain travels fully around her body (at the area of the umbilicus to the back).  The pain is made much worse by coughing.  Patient has previous surgical history including cholecystectomy, appendectomy, hysterectomy. Pt also reports fever, productive cough with white and yellow sputum. Pt denies nausea, emesis, or diarrhea. Pt has a history of hypertension, BP on arrival is 200/116 with a pulse of 82. Pt has equal strength in all extremities, ambulatory to triage with the assistance of a cane (which she has used for 4 years due to arthritis) without difficulty. The patient denies blurred or double vision.   Past Medical History  Diagnosis Date  . Hypertension   . Depression   . Asthma   . Arthritis   . Heart murmur    Past Surgical History  Procedure Laterality Date  . Abdominal hysterectomy    . Bladder suspension    . Appendectomy    . Cesarean section      3 previous  . Tubal ligation     Family History  Problem Relation Age of Onset  . Hypertension Mother   . Asthma Mother   . Hypertension Sister   . Asthma Sister   . Hypertension Maternal Grandmother   . Heart defect Sister   . Asthma Sister   . Aneurysm Sister   . Asthma Sister    History  Substance Use Topics  . Smoking status: Never Smoker   . Smokeless tobacco: Never Used  . Alcohol Use: No   OB History   Grav Para Term Preterm Abortions TAB SAB Ect Mult Living   3 3 3       3      Review of Systems All other systems reviewed and are negative   Allergies  Codeine  Home Medications   Prior to Admission medications   Medication Sig Start Date End Date Taking? Authorizing Provider  albuterol  (PROVENTIL HFA;VENTOLIN HFA) 108 (90 BASE) MCG/ACT inhaler Inhale 2 puffs into the lungs every 6 (six) hours as needed. For shortness of breath. 05/30/13  Yes Rosalita Chessman, DO  cloNIDine (CATAPRES) 0.2 MG tablet Take 1 tablet (0.2 mg total) by mouth 2 (two) times daily. 05/30/13  Yes Yvonne R Lowne, DO  traMADol (ULTRAM) 50 MG tablet Take 50 mg by mouth once.   Yes Historical Provider, MD  triamterene-hydrochlorothiazide (MAXZIDE) 75-50 MG per tablet TAKE ONE TABLET BY MOUTH EVERY DAY 11/08/13  Yes Yvonne R Lowne, DO   BP 177/111  Pulse 75  Temp(Src) 98 F (36.7 C) (Oral)  Resp 20  SpO2 94% Physical Exam  Nursing note and vitals reviewed. Constitutional: She is oriented to person, place, and time. She appears well-developed and well-nourished. No distress.  HENT:  Head: Normocephalic and atraumatic.  Eyes: Pupils are equal, round, and reactive to light.  Neck: Normal range of motion.  Cardiovascular: Normal rate and intact distal pulses.   Pulmonary/Chest: No respiratory distress. She has no wheezes. She has no rales.  Abdominal: Soft. Normal appearance and bowel sounds are normal. She exhibits no distension. There is no tenderness. There is no rebound and no guarding.  Musculoskeletal: Normal range of motion.  Neurological: She is alert and oriented to person, place, and time. No cranial nerve deficit.  Skin: Skin is warm and dry. No rash noted.  Psychiatric: She has a normal mood and affect. Her behavior is normal.    ED Course  Procedures (including critical care time) Labs Review Labs Reviewed  CBC WITH DIFFERENTIAL - Abnormal; Notable for the following:    WBC 3.8 (*)    All other components within normal limits  COMPREHENSIVE METABOLIC PANEL - Abnormal; Notable for the following:    Potassium 3.4 (*)    Glucose, Bld 176 (*)    BUN 5 (*)    All other components within normal limits  URINALYSIS, ROUTINE W REFLEX MICROSCOPIC - Abnormal; Notable for the following:    Protein,  ur 30 (*)    All other components within normal limits  LIPASE, BLOOD  URINE MICROSCOPIC-ADD ON    Imaging Review Dg Chest 2 View  02/09/2014   CLINICAL DATA:  Chest pain, short of breath, cough, fever  EXAM: CHEST  2 VIEW  COMPARISON:  None.  FINDINGS: The lungs are clear and negative for focal airspace consolidation, pulmonary edema or suspicious pulmonary nodule. No pleural effusion or pneumothorax. Cardiac and mediastinal contours are within normal limits. No acute fracture or lytic or blastic osseous lesions. The visualized upper abdominal bowel gas pattern is unremarkable. Surgical clips in the right upper quadrant suggest prior cholecystectomy.  IMPRESSION: No active cardiopulmonary disease.   Electronically Signed   By: Jacqulynn Cadet M.D.   On: 02/09/2014 13:03      MDM   Final diagnoses:  Influenza        Dot Lanes, MD 02/11/14 [phone]

## 2014-02-09 NOTE — ED Notes (Signed)
Pt presents with complaint of abdominal pain, fever, and headache. Pt states that her abdominal pain travels fully around her body (at the area of the umbilicus to the back). Pt also reports fever, productive cough with white and yellow sputum. Pt denies nausea, emesis, or diarrhea. Pt has a history of hypertension, BP on arrival is 200/116 with a pulse of 82. Pt has equal strength in all extremities, ambulatory to triage with the assistance of a cane (which she has used for 4 years due to arthritis) without difficulty. The patient denies blurred or double vision.

## 2014-03-07 ENCOUNTER — Encounter (HOSPITAL_COMMUNITY): Payer: Self-pay | Admitting: Emergency Medicine

## 2014-03-07 DIAGNOSIS — J45909 Unspecified asthma, uncomplicated: Secondary | ICD-10-CM | POA: Diagnosis not present

## 2014-03-07 DIAGNOSIS — R079 Chest pain, unspecified: Secondary | ICD-10-CM | POA: Insufficient documentation

## 2014-03-07 DIAGNOSIS — I10 Essential (primary) hypertension: Secondary | ICD-10-CM | POA: Insufficient documentation

## 2014-03-07 DIAGNOSIS — Z79899 Other long term (current) drug therapy: Secondary | ICD-10-CM | POA: Diagnosis not present

## 2014-03-07 DIAGNOSIS — R011 Cardiac murmur, unspecified: Secondary | ICD-10-CM | POA: Insufficient documentation

## 2014-03-07 DIAGNOSIS — M129 Arthropathy, unspecified: Secondary | ICD-10-CM | POA: Diagnosis not present

## 2014-03-07 DIAGNOSIS — Z8659 Personal history of other mental and behavioral disorders: Secondary | ICD-10-CM | POA: Diagnosis not present

## 2014-03-07 DIAGNOSIS — R918 Other nonspecific abnormal finding of lung field: Secondary | ICD-10-CM | POA: Diagnosis not present

## 2014-03-07 LAB — CBC
HCT: 36.9 % (ref 36.0–46.0)
Hemoglobin: 12.8 g/dL (ref 12.0–15.0)
MCH: 28.8 pg (ref 26.0–34.0)
MCHC: 34.7 g/dL (ref 30.0–36.0)
MCV: 83.1 fL (ref 78.0–100.0)
Platelets: 266 10*3/uL (ref 150–400)
RBC: 4.44 MIL/uL (ref 3.87–5.11)
RDW: 14.7 % (ref 11.5–15.5)
WBC: 5.6 10*3/uL (ref 4.0–10.5)

## 2014-03-07 LAB — I-STAT TROPONIN, ED: Troponin i, poc: 0 ng/mL (ref 0.00–0.08)

## 2014-03-07 NOTE — ED Notes (Signed)
Presents with right sided chest pain that began over one week ago associated with dizziness and lightheadedness, SOB and nausea. Pain is described as squeezing and eases up at times then gets tighter. Pain radiates to left neck and left shoulder. Nothing makes pain better. Husband is a patient on 3S.

## 2014-03-08 ENCOUNTER — Emergency Department (HOSPITAL_COMMUNITY)
Admission: EM | Admit: 2014-03-08 | Discharge: 2014-03-08 | Disposition: A | Discharge status: Home / Self Care | Payer: Medicare Other | Attending: Emergency Medicine | Admitting: Emergency Medicine

## 2014-03-08 ENCOUNTER — Emergency Department (HOSPITAL_COMMUNITY): Payer: Medicare Other

## 2014-03-08 DIAGNOSIS — R079 Chest pain, unspecified: Secondary | ICD-10-CM

## 2014-03-08 DIAGNOSIS — R918 Other nonspecific abnormal finding of lung field: Secondary | ICD-10-CM | POA: Diagnosis not present

## 2014-03-08 LAB — BASIC METABOLIC PANEL
BUN: 7 mg/dL (ref 6–23)
CO2: 28 mEq/L (ref 19–32)
Calcium: 8.5 mg/dL (ref 8.4–10.5)
Chloride: 100 mEq/L (ref 96–112)
Creatinine, Ser: 0.68 mg/dL (ref 0.50–1.10)
GFR calc Af Amer: >90 mL/min (ref >90)
GFR calc non Af Amer: >90 mL/min (ref >90)
Glucose, Bld: 247 mg/dL — ABNORMAL HIGH (ref 70–99)
Potassium: 3.2 mEq/L — ABNORMAL LOW (ref 3.7–5.3)
Sodium: 142 mEq/L (ref 137–147)

## 2014-03-08 LAB — TROPONIN I: Troponin I: <0.3 ng/mL (ref <0.30)

## 2014-03-08 MED ORDER — FAMOTIDINE 20 MG PO TABS: 20.0000 mg | ORAL_TABLET | Freq: Two times a day (BID) | ORAL | Status: DC

## 2014-03-08 MED ORDER — GI COCKTAIL ~~LOC~~
30.0000 mL | Freq: Once | ORAL | Status: AC
  Administered 2014-03-08: 30 mL via ORAL
  Filled 2014-03-08: qty 30

## 2014-03-08 NOTE — Discharge Instructions (Signed)
All of your heart tests have been normal, there is no sign of heart attack though you do have high blood pressure and a slightly enlarged heart. Please followup with your doctor early this week for recheck. Take your blood pressure medication exactly as prescribed, start taking Pepcid every night for acid reflux as this may be the cause of your chest pain.  Please call your doctor for a followup appointment within 24-48 hours. When you talk to your doctor please let them know that you were seen in the emergency department and have them acquire all of your records so that they can discuss the findings with you and formulate a treatment plan to fully care for your new and ongoing problems.

## 2014-03-08 NOTE — ED Provider Notes (Signed)
CSN: [id number]     Arrival date & time 03/07/14  2255 History   First MD Initiated Contact with Patient 03/08/14 0207     Chief Complaint  Patient presents with  . Chest Pain     (Consider location/radiation/quality/duration/timing/severity/associated sxs/prior Treatment) HPI Comments: The patient is a 53 year old female, history of hypertension and asthma who presents with a complaint of right-sided chest pain. She states this started approximately one week ago, reports that this is worse at night and worse when she lays down. It is nonexertional and she does not get this when she walks up and down the hallway. Her husband is currently admitted to the hospital as an inpatient after surgery and she has been in his bed room over the last couple of days. This evening the pain came on and has been rather persistent for the last several hours, is a heavy or pressure sensation in the right chest which radiates to her shoulder blade and to the left neck. She does have some spinal pain in the past. She denies any numbness or weakness of the upper extremities and has had no swelling of the lower extremities, no other risk factors for DVT or pulmonary embolism. During her admission to the hospital in July of 2014 she had chest pain, she had normal troponins, normal CT angiogram of the chest with regards to pulmonary embolism and was discharged home to followup in the outpatient setting. She has had no further stress testing or other provocative testing to further evaluate her chest pain. She has had no chest pain from July until this time. She denies shortness of breath, fever, cough or any other complaints.  Patient is a 53 y.o. female presenting with chest pain. The history is provided by the patient.  Chest Pain   Past Medical History  Diagnosis Date  . Hypertension   . Depression   . Asthma   . Arthritis   . Heart murmur    Past Surgical History  Procedure Laterality Date  . Abdominal  hysterectomy    . Bladder suspension    . Appendectomy    . Cesarean section      3 previous  . Tubal ligation     Family History  Problem Relation Age of Onset  . Hypertension Mother   . Asthma Mother   . Hypertension Sister   . Asthma Sister   . Hypertension Maternal Grandmother   . Heart defect Sister   . Asthma Sister   . Aneurysm Sister   . Asthma Sister    History  Substance Use Topics  . Smoking status: Never Smoker   . Smokeless tobacco: Never Used  . Alcohol Use: No   OB History   Grav Para Term Preterm Abortions TAB SAB Ect Mult Living   3 3 3       3      Review of Systems  Cardiovascular: Positive for chest pain.  All other systems reviewed and are negative.     Allergies  Codeine  Home Medications   Prior to Admission medications   Medication Sig Start Date End Date Taking? Authorizing Provider  albuterol (PROVENTIL HFA;VENTOLIN HFA) 108 (90 BASE) MCG/ACT inhaler Inhale 2 puffs into the lungs every 6 (six) hours as needed. For shortness of breath. 05/30/13  Yes Rosalita Chessman, DO  cloNIDine (CATAPRES) 0.2 MG tablet Take 1 tablet (0.2 mg total) by mouth 2 (two) times daily. 05/30/13  Yes Yvonne R Lowne, DO  traMADol (ULTRAM) 50 MG  tablet Take 50 mg by mouth every 6 (six) hours as needed.   Yes Historical Provider, MD  triamterene-hydrochlorothiazide (MAXZIDE) 75-50 MG per tablet Take 1 tablet by mouth daily.   Yes Historical Provider, MD   BP 148/95  Pulse 77  Temp(Src) 98 F (36.7 C) (Oral)  Resp 16  SpO2 97% Physical Exam  Nursing note and vitals reviewed. Constitutional: She appears well-developed and well-nourished. No distress.  HENT:  Head: Normocephalic and atraumatic.  Mouth/Throat: Oropharynx is clear and moist. No oropharyngeal exudate.  Eyes: Conjunctivae and EOM are normal. Pupils are equal, round, and reactive to light. Right eye exhibits no discharge. Left eye exhibits no discharge. No scleral icterus.  Neck: Normal range of motion.  Neck supple. No JVD present. No thyromegaly present.  Cardiovascular: Normal rate, regular rhythm, normal heart sounds and intact distal pulses.  Exam reveals no gallop and no friction rub.   No murmur heard. Pulmonary/Chest: Effort normal and breath sounds normal. No respiratory distress. She has no wheezes. She has no rales.  Abdominal: Soft. Bowel sounds are normal. She exhibits no distension and no mass. There is no tenderness.  Musculoskeletal: Normal range of motion. She exhibits no edema and no tenderness.  Lymphadenopathy:    She has no cervical adenopathy.  Neurological: She is alert. Coordination normal.  Skin: Skin is warm and dry. No rash noted. No erythema.  Psychiatric: She has a normal mood and affect. Her behavior is normal.    ED Course  Procedures (including critical care time) Labs Review Labs Reviewed  BASIC METABOLIC PANEL - Abnormal; Notable for the following:    Potassium 3.2 (*)    Glucose, Bld 247 (*)    All other components within normal limits  CBC  TROPONIN I  Randolm Idol, ED    Imaging Review Dg Chest 2 View  03/08/2014   CLINICAL DATA:  Chest pain.  EXAM: CHEST  2 VIEW  COMPARISON:  Chest radiograph performed 02/09/2014  FINDINGS: The lungs are well-aerated. Minimal left basilar opacity likely reflects atelectasis. There is no evidence of pleural effusion or pneumothorax.  The heart is normal in size; the mediastinal contour is within normal limits. No acute osseous abnormalities are seen. Clips are noted within the right upper quadrant, reflecting prior cholecystectomy.  IMPRESSION: Minimal left basilar airspace opacity likely reflects atelectasis; lungs otherwise clear.   Electronically Signed   By: Garald Balding M.D.   On: 03/08/2014 00:30     EKG Interpretation   Date/Time:  Friday Mar 07 2014 22:59:47 EDT Ventricular Rate:  89 PR Interval:  168 QRS Duration: 94 QT Interval:  390 QTC Calculation: 474 R Axis:   -46 Text Interpretation:   Normal sinus rhythm Left ventricular hypertrophy  Nonspecific T wave abnormality Prolonged QT Abnormal ECG since last  tracing no significant change Confirmed by Jinan Biggins  MD, Josefa Syracuse ([id number]) on  03/08/2014 2:10:11 AM      MDM   Final diagnoses:  Chest pain    The patient has a rather benign exam, she has normal vital signs except for mild hypertension and her EKG data showed left ventricular hypertrophy but this is overall unchanged from prior EKGs. I will obtain a second troponin as the first one was normal, rule out myocardial infarction though the sound of her symptoms being nonexertional it is unlikely to be cardiac related.  Repeat EKG without changes, repeat troponin totally normal, GI cocktail with almost complete resolution of the patient's symptoms. She appears stable for discharge,  doubt cardiac source, patient encouraged to followup early this week for recheck especially if symptoms persist.  Patient expresses her understanding to the indications for return.  Johnna Acosta, MD 03/08/14 [phone]

## 2014-04-25 ENCOUNTER — Other Ambulatory Visit: Payer: Self-pay | Admitting: Family Medicine

## 2014-05-20 ENCOUNTER — Telehealth: Payer: Self-pay | Admitting: Family Medicine

## 2014-05-20 NOTE — Telephone Encounter (Signed)
Patient Information:  Caller Name: Heather Walker  Phone: [phone]  Patient: Heather Walker, Heather Walker  Gender: Female  DOB: 02-23-1961  Age: 53 Years  PCP: Rosalita Chessman.  Pregnant: No  Office Follow Up:  Does the office need to follow up with this patient?: No  Instructions For The Office: N/A  RN Note:  Nausea with mild pressure/ discomfort upper abdomen associated with nausea. Denies chest pain.  Abdominal discomfort does not radiate anywhere. Instructed to sip fluids, rest and eat bland starchy solids as tolerated. Call back if unable to keep fluids down for 8 hours, severe vomiting with diarrhea, fever over 101, constant abdominal pain, or if symptoms worsen.  Home care advise overidden; advised to keep appointment previously scheduled for 1500 05/21/14 for all other symptoms per Nausea or Vomiting.  Symptoms  Reason For Call & Symptoms: Nausea and feels jittery.  No vomiting or diarrhea.  Reviewed Health History In EMR: Yes  Reviewed Medications In EMR: Yes  Reviewed Allergies In EMR: Yes  Reviewed Surgeries / Procedures: Yes  Date of Onset of Symptoms: 05/20/2014 OB / GYN:  LMP: Unknown  Guideline(s) Used:  No Protocol Available - Sick Adult  Disposition Per Guideline:   See Today or Tomorrow in Office  Reason For Disposition Reached:   Nursing judgment  Advice Given:  Call Back If:  New symptoms develop  You become worse.  RN Overrode Recommendation:  Patient Already Has Appt, Document Patient  Office scheduled appointment for 05/21/13 at 1500 before triage.

## 2014-05-20 NOTE — Telephone Encounter (Signed)
Patient is scheduled for an office for symptoms. CAN advised per precautions.

## 2014-05-21 ENCOUNTER — Ambulatory Visit: Payer: Medicare Other | Admitting: Medical

## 2014-05-28 ENCOUNTER — Ambulatory Visit (INDEPENDENT_AMBULATORY_CARE_PROVIDER_SITE_OTHER): Payer: Medicare Other | Admitting: Medical

## 2014-05-28 ENCOUNTER — Encounter: Payer: Self-pay | Admitting: Medical

## 2014-05-28 VITALS — BP 147/89 | HR 82 | Temp 98.2°F | Wt 220.0 lb

## 2014-05-28 DIAGNOSIS — R1011 Right upper quadrant pain: Secondary | ICD-10-CM | POA: Diagnosis not present

## 2014-05-28 LAB — CBC WITH DIFFERENTIAL/PLATELET
Basophils Absolute: 0 10*3/uL (ref 0.0–0.1)
Basophils Relative: 0.6 % (ref 0.0–3.0)
Eosinophils Absolute: 0.1 10*3/uL (ref 0.0–0.7)
Eosinophils Relative: 2.6 % (ref 0.0–5.0)
HCT: 37.3 % (ref 36.0–46.0)
Hemoglobin: 12.5 g/dL (ref 12.0–15.0)
Lymphocytes Relative: 47.6 % — ABNORMAL HIGH (ref 12.0–46.0)
Lymphs Abs: 1.9 10*3/uL (ref 0.7–4.0)
MCHC: 33.6 g/dL (ref 30.0–36.0)
MCV: 84.9 fl (ref 78.0–100.0)
Monocytes Absolute: 0.3 10*3/uL (ref 0.1–1.0)
Monocytes Relative: 8.2 % (ref 3.0–12.0)
Neutro Abs: 1.7 10*3/uL (ref 1.4–7.7)
Neutrophils Relative %: 41 % — ABNORMAL LOW (ref 43.0–77.0)
Platelets: 227 10*3/uL (ref 150.0–400.0)
RBC: 4.39 Mil/uL (ref 3.87–5.11)
RDW: 15.3 % (ref 11.5–15.5)
WBC: 4.1 10*3/uL (ref 4.0–10.5)

## 2014-05-28 LAB — COMPREHENSIVE METABOLIC PANEL
ALT: 21 U/L (ref 0–35)
AST: 27 U/L (ref 0–37)
Albumin: 3.7 g/dL (ref 3.5–5.2)
Alkaline Phosphatase: 89 U/L (ref 39–117)
BUN: 9 mg/dL (ref 6–23)
CO2: 29 mEq/L (ref 19–32)
Calcium: 9 mg/dL (ref 8.4–10.5)
Chloride: 100 mEq/L (ref 96–112)
Creatinine, Ser: 0.8 mg/dL (ref 0.4–1.2)
GFR: 95.65 mL/min (ref >60.00)
Glucose, Bld: 168 mg/dL — ABNORMAL HIGH (ref 70–99)
Potassium: 3.1 mEq/L — ABNORMAL LOW (ref 3.5–5.1)
Sodium: 139 mEq/L (ref 135–145)
Total Bilirubin: 0.5 mg/dL (ref 0.2–1.2)
Total Protein: 7.5 g/dL (ref 6.0–8.3)

## 2014-05-28 LAB — LIPASE: Lipase: 28 U/L (ref 11.0–59.0)

## 2014-05-28 LAB — H. PYLORI ANTIBODY, IGG: H Pylori IgG: NEGATIVE

## 2014-05-28 MED ORDER — OMEPRAZOLE 20 MG PO CPDR: 20.0000 mg | DELAYED_RELEASE_CAPSULE | Freq: Every day | ORAL | Status: DC

## 2014-05-28 NOTE — Patient Instructions (Signed)
For your rt sided rt upper quadrant pain we will do labs today to evaluate your liver, pancrease and other possible causes. Since your symptoms area  resolved except for residual pain found on exam we will wait for lab results to determine if further blood work needed or possible imaging studies. During interim before we call if your symptoms change or worsen please notify us. Follow up in 2 wks  Or as needed.

## 2014-05-28 NOTE — Progress Notes (Signed)
Pre visit review using our clinic review tool, if applicable. No additional management support is needed unless otherwise documented below in the visit note. 

## 2014-05-28 NOTE — Progress Notes (Signed)
Subjective:    Patient ID: Heather Walker, female    DOB: January 09, 1961, 53 y.o.   MRN: [id number]  HPI  Pt states expecting to see Dr. Etter Sjogren. I explained to her I could see her and she approved for me to see her. Pt states she had some serious pain rt side of abdomen 7-8 days  Ago for one day. Point to ruq region. She did have some dark colored urine for about one week. Also some reflux type symptom past week.  Her urine looks normal. Now clear. Pt states her stomach feels fine today.By Thursday last week felt fine. She did have some constipation when had she had abdominal pain. That cleared week ago  and one day loose stool next day. But now had 3-4 day normal bm and also no abdominal pain.  On review pt had 3 c-sections. Partial hysterectomy, cholecystectomy and appendectomy.  Past Medical History  Diagnosis Date  . Hypertension   . Depression   . Asthma   . Arthritis   . Heart murmur     History   Social History  . Marital Status: Married    Spouse Name: N/A    Number of Children: N/A  . Years of Education: ged   Occupational History  . disabled    Social History Main Topics  . Smoking status: Never Smoker   . Smokeless tobacco: Never Used  . Alcohol Use: No  . Drug Use: No  . Sexual Activity: Yes    Partners: Male    Birth Control/ Protection: Surgical   Other Topics Concern  . Not on file   Social History Narrative  . No narrative on file    Past Surgical History  Procedure Laterality Date  . Abdominal hysterectomy    . Bladder suspension    . Appendectomy    . Cesarean section      3 previous  . Tubal ligation      Family History  Problem Relation Age of Onset  . Hypertension Mother   . Asthma Mother   . Hypertension Sister   . Asthma Sister   . Hypertension Maternal Grandmother   . Heart defect Sister   . Asthma Sister   . Aneurysm Sister   . Asthma Sister     Allergies  Allergen Reactions  . Codeine Itching    Current Outpatient  Prescriptions on File Prior to Visit  Medication Sig Dispense Refill  . albuterol (PROVENTIL HFA;VENTOLIN HFA) 108 (90 BASE) MCG/ACT inhaler Inhale 2 puffs into the lungs every 6 (six) hours as needed. For shortness of breath.  1 Inhaler  4  . cloNIDine (CATAPRES) 0.2 MG tablet Take 1 tab by mouth twice daily--office visit due now  60 tablet  0  . famotidine (PEPCID) 20 MG tablet Take 1 tablet (20 mg total) by mouth 2 (two) times daily.  30 tablet  0  . traMADol (ULTRAM) 50 MG tablet Take 50 mg by mouth every 6 (six) hours as needed.      . triamterene-hydrochlorothiazide (MAXZIDE) 75-50 MG per tablet Take 1 tablet by mouth daily.       No current facility-administered medications on file prior to visit.    BP 147/89  Pulse 82  Temp(Src) 98.2 F (36.8 C)  Wt 220 lb (99.791 kg)  SpO2 98%    Review of Systems  Constitutional: Negative for fever, chills and fatigue.  Respiratory: Negative for cough, chest tightness and wheezing.   Cardiovascular: Negative for chest  pain and palpitations.  Gastrointestinal: Positive for abdominal pain. Negative for nausea, vomiting, diarrhea, constipation, blood in stool, anal bleeding and rectal pain.       Pt thought all pain resolved but residual pain found on ruq on exam.  Some constipation around time had abdomen pain.  Some heart burn also last week.  Genitourinary: Negative for dysuria, urgency, frequency, hematuria and flank pain.       Some days concentrated urine when had abdominal pain.  Skin: Negative for color change and rash.  Neurological: Negative for dizziness, tremors, syncope, weakness, light-headedness, numbness and headaches.  Hematological: Negative for adenopathy. Does not bruise/bleed easily.       Objective:   Physical Exam  General Appearance- Not in acute distress.  HEENT Eyes- Scleraeral/Conjuntiva-bilat- Not Yellow. Mouth & Throat- Normal.  Chest and Lung Exam Auscultation: Breath sounds:-Normal. Adventitious  sounds:- No Adventitious sounds.  Cardiovascular Auscultation:Rythm - Regular. Heart Sounds -Normal heart sounds.  Abdomen Inspection:-Inspection Normal.  Palpation/Perucssion: Palpationl- Mild  Tender RUQ region pain, No Rebound tenderness, No rigidity(Guarding) and No Palpable abdominal masses.  Liver:-Normal.  Spleen:- Normal.    Back No cva tenderness.       .        Assessment & Plan:

## 2014-05-28 NOTE — Assessment & Plan Note (Addendum)
Patient reports that she has resolution of symptoms. However on the physical exam palpation of the right upper quadrant shows that she still has moderate tenderness. Patient has a history of cholecystectomy. With her reported dark urine last week and with  pain on exam I decided will get labs to include CBC, CMP, lipase and H. pylori. Her urine showed some protein and she reports not hydrating well recently. So we'll follow the labs and will consider possible imaging studies/ultrasound if pain persists or if liver enzymes are elevated. In addition if liver enzymes are elevated would consider comprehensive hepatitis panel. Also, with her body habitus pain in the right upper quadrant could be from fatty liver. Will monitor.  For present advised eat healthy. Hold famotidine and rx of omeprazole.

## 2014-05-30 ENCOUNTER — Telehealth: Payer: Self-pay

## 2014-05-30 DIAGNOSIS — E876 Hypokalemia: Secondary | ICD-10-CM

## 2014-05-30 MED ORDER — POTASSIUM CHLORIDE CRYS ER 20 MEQ PO TBCR: 20.0000 meq | EXTENDED_RELEASE_TABLET | Freq: Every day | ORAL | Status: DC

## 2014-05-30 NOTE — Telephone Encounter (Signed)
Message copied by Reino Bellis on Fri May 30, 2014  5:11 PM ------      Message from: Anabel Halon      Created: Thu May 29, 2014  8:57 PM       Pt has chronically low k. I want her to take k-dur #15 tabs 20 meq 1 tab po q every other day. Call/fax in rx. Repeat k level in 2 wks. Also how is pt rt upper quadrant pain. Does she still have any pain when she presses on same area I pressed . Let me know. Next step I would order ruq ultrasound assess for fatty liver. Other labs normal. Please review those with patient. ------

## 2014-05-30 NOTE — Telephone Encounter (Signed)
Spoke with patient and advised per recommendations. Enacted k+ orders to [facility] on [address]

## 2014-06-04 ENCOUNTER — Other Ambulatory Visit: Payer: Self-pay | Admitting: Family Medicine

## 2014-07-11 ENCOUNTER — Encounter: Payer: Self-pay | Admitting: Family Medicine

## 2014-07-11 ENCOUNTER — Ambulatory Visit (INDEPENDENT_AMBULATORY_CARE_PROVIDER_SITE_OTHER): Payer: Medicare Other | Admitting: Family Medicine

## 2014-07-11 ENCOUNTER — Telehealth: Payer: Self-pay

## 2014-07-11 VITALS — BP 160/92 | HR 72 | Temp 98.7°F | Wt 219.4 lb

## 2014-07-11 DIAGNOSIS — G894 Chronic pain syndrome: Secondary | ICD-10-CM | POA: Diagnosis not present

## 2014-07-11 DIAGNOSIS — J45909 Unspecified asthma, uncomplicated: Secondary | ICD-10-CM | POA: Diagnosis not present

## 2014-07-11 DIAGNOSIS — I10 Essential (primary) hypertension: Secondary | ICD-10-CM

## 2014-07-11 DIAGNOSIS — Z0000 Encounter for general adult medical examination without abnormal findings: Secondary | ICD-10-CM | POA: Diagnosis not present

## 2014-07-11 DIAGNOSIS — R0789 Other chest pain: Secondary | ICD-10-CM

## 2014-07-11 LAB — BASIC METABOLIC PANEL
BUN: 11 mg/dL (ref 6–23)
CO2: 30 mEq/L (ref 19–32)
Calcium: 9.5 mg/dL (ref 8.4–10.5)
Chloride: 95 mEq/L — ABNORMAL LOW (ref 96–112)
Creatinine, Ser: 1 mg/dL (ref 0.4–1.2)
GFR: 79.54 mL/min (ref >60.00)
Glucose, Bld: 234 mg/dL — ABNORMAL HIGH (ref 70–99)
Potassium: 2.8 mEq/L — CL (ref 3.5–5.1)
Sodium: 134 mEq/L — ABNORMAL LOW (ref 135–145)

## 2014-07-11 LAB — LIPID PANEL
Cholesterol: 185 mg/dL (ref 0–200)
HDL: 59.6 mg/dL (ref >39.00)
LDL Cholesterol: 98 mg/dL (ref 0–99)
NonHDL: 125.4
Total CHOL/HDL Ratio: 3
Triglycerides: 139 mg/dL (ref 0.0–149.0)
VLDL: 27.8 mg/dL (ref 0.0–40.0)

## 2014-07-11 LAB — HEPATIC FUNCTION PANEL
ALT: 24 U/L (ref 0–35)
AST: 26 U/L (ref 0–37)
Albumin: 4.2 g/dL (ref 3.5–5.2)
Alkaline Phosphatase: 97 U/L (ref 39–117)
Bilirubin, Direct: 0 mg/dL (ref 0.0–0.3)
Total Bilirubin: 0.4 mg/dL (ref 0.2–1.2)
Total Protein: 8.8 g/dL — ABNORMAL HIGH (ref 6.0–8.3)

## 2014-07-11 LAB — POCT URINALYSIS DIPSTICK
Bilirubin, UA: NEGATIVE
Blood, UA: NEGATIVE
Glucose, UA: 1
Ketones, UA: NEGATIVE
Leukocytes, UA: NEGATIVE
Nitrite, UA: NEGATIVE
Protein, UA: 1
Spec Grav, UA: 1.02
Urobilinogen, UA: 0.2
pH, UA: 6.5

## 2014-07-11 LAB — MICROALBUMIN / CREATININE URINE RATIO
Creatinine,U: 101.4 mg/dL
Microalb Creat Ratio: 17.6 mg/g (ref 0.0–30.0)
Microalb, Ur: 17.8 mg/dL — ABNORMAL HIGH (ref 0.0–1.9)

## 2014-07-11 LAB — EKG 12-LEAD

## 2014-07-11 MED ORDER — TRAMADOL HCL 50 MG PO TABS: 50.0000 mg | ORAL_TABLET | Freq: Four times a day (QID) | ORAL | Status: DC | PRN

## 2014-07-11 MED ORDER — CLONIDINE HCL 0.2 MG PO TABS: ORAL_TABLET | ORAL | Status: DC

## 2014-07-11 MED ORDER — ALBUTEROL SULFATE HFA 108 (90 BASE) MCG/ACT IN AERS: 2.0000 | INHALATION_SPRAY | Freq: Four times a day (QID) | RESPIRATORY_TRACT | Status: DC | PRN

## 2014-07-11 MED ORDER — POTASSIUM CHLORIDE CRYS ER 20 MEQ PO TBCR: 20.0000 meq | EXTENDED_RELEASE_TABLET | Freq: Every day | ORAL | Status: DC

## 2014-07-11 MED ORDER — TRIAMTERENE-HCTZ 75-50 MG PO TABS: 1.0000 | ORAL_TABLET | Freq: Every day | ORAL | Status: DC

## 2014-07-11 NOTE — Telephone Encounter (Signed)
Please make sure pt is taking potassium

## 2014-07-11 NOTE — Progress Notes (Signed)
Subjective:    Patient here for follow-up of elevated blood pressure.  She is not exercising and is not adherent to a low-salt diet.  Blood pressure is not well controlled at home. Cardiac symptoms: palpitations. Patient denies: chest pain, chest pressure/discomfort, claudication, dyspnea, exertional chest pressure/discomfort, fatigue, irregular heart beat, lower extremity edema, near-syncope, orthopnea, paroxysmal nocturnal dyspnea, syncope and tachypnea. Cardiovascular risk factors: hypertension, obesity (BMI >= 30 kg/m2) and sedentary lifestyle. Use of agents associated with hypertension: none. History of target organ damage: none.  The following portions of the patient's history were reviewed and updated as appropriate:  She  has a past medical history of Hypertension; Depression; Asthma; Arthritis; and Heart murmur. She  does not have any pertinent problems on file. She  has past surgical history that includes Abdominal hysterectomy; Bladder suspension; Appendectomy; Cesarean section; and Tubal ligation. Her family history includes Aneurysm in her sister; Asthma in her mother, sister, sister, and sister; Heart defect in her sister; Hypertension in her maternal grandmother, mother, and sister. She  reports that she has never smoked. She has never used smokeless tobacco. She reports that she does not drink alcohol or use illicit drugs. She has a current medication list which includes the following prescription(s): albuterol, clonidine, famotidine, omeprazole, potassium chloride sa, tramadol, and triamterene-hydrochlorothiazide. Current Outpatient Prescriptions on File Prior to Visit  Medication Sig Dispense Refill  . famotidine (PEPCID) 20 MG tablet Take 1 tablet (20 mg total) by mouth 2 (two) times daily.  30 tablet  0  . omeprazole (PRILOSEC) 20 MG capsule Take 1 capsule (20 mg total) by mouth daily.  30 capsule  1   No current facility-administered medications on file prior to visit.   She  is allergic to codeine..  Review of Systems As above   Objective:    BP 160/92  Pulse 72  Temp(Src) 98.7 F (37.1 C) (Oral)  Wt 219 lb 6.4 oz (99.519 kg)  SpO2 97% General appearance: alert, cooperative, appears stated age and no distress Throat: lips, mucosa, and tongue normal; teeth and gums normal Neck: no adenopathy, supple, symmetrical, trachea midline and thyroid not enlarged, symmetric, no tenderness/mass/nodules Lungs: clear to auscultation bilaterally Heart: S1, S2 normal+ murmur Extremities: extremities normal, atraumatic, no cyanosis or edema    Assessment:    Hypertension, elevated. Evidence of target organ damage: none.    Plan:    Medication: no change. Dietary sodium restriction. Regular aerobic exercise. Check blood pressures 2-3 times weekly and record. Follow up: 2 weeks and as needed.   1. Unspecified asthma(493.90)  - albuterol (PROVENTIL HFA;VENTOLIN HFA) 108 (90 BASE) MCG/ACT inhaler; Inhale 2 puffs into the lungs every 6 (six) hours as needed. For shortness of breath.  Dispense: 1 Inhaler; Refill: 4  2. Essential hypertension Elevated-- pt ran out of meds - triamterene-hydrochlorothiazide (MAXZIDE) 75-50 MG per tablet; Take 1 tablet by mouth daily.  Dispense: 90 tablet; Refill: 1 - potassium chloride SA (K-DUR,KLOR-CON) 20 MEQ tablet; Take 1 tablet (20 mEq total) by mouth daily.  Dispense: 90 tablet; Refill: 1 - cloNIDine (CATAPRES) 0.2 MG tablet; take 1 tablet by mouth twice a day  Dispense: 180 tablet; Refill: 1 - Basic metabolic panel - Hepatic function panel - Lipid panel - Microalbumin / creatinine urine ratio - POCT urinalysis dipstick  3. Chronic pain syndrome   - traMADol (ULTRAM) 50 MG tablet; Take 1 tablet (50 mg total) by mouth every 6 (six) hours as needed.  Dispense: 90 tablet; Refill: 1  4. Preventative health  care   - Ambulatory referral to Gastroenterology  5. Other chest pain  Go to ER if pain occurs again  - EKG  12-Lead

## 2014-07-11 NOTE — Patient Instructions (Signed)

## 2014-07-11 NOTE — Telephone Encounter (Signed)
Critical lab value reported to [employer], Lab Tech: K- 2.8 New prescription for potassium chloride given today (07/11/14).    Please advise.

## 2014-07-11 NOTE — Progress Notes (Signed)
Pre visit review using our clinic review tool, if applicable. No additional management support is needed unless otherwise documented below in the visit note. 

## 2014-07-12 ENCOUNTER — Emergency Department (HOSPITAL_COMMUNITY): Payer: Medicare Other

## 2014-07-12 ENCOUNTER — Encounter (HOSPITAL_COMMUNITY): Payer: Self-pay | Admitting: Emergency Medicine

## 2014-07-12 ENCOUNTER — Observation Stay (HOSPITAL_COMMUNITY)
Admission: EM | Admit: 2014-07-12 | Discharge: 2014-07-15 | Disposition: A | Discharge status: Home / Self Care | Payer: Medicare Other | Attending: Internal Medicine | Admitting: Internal Medicine

## 2014-07-12 DIAGNOSIS — Z23 Encounter for immunization: Secondary | ICD-10-CM | POA: Diagnosis not present

## 2014-07-12 DIAGNOSIS — I10 Essential (primary) hypertension: Secondary | ICD-10-CM | POA: Insufficient documentation

## 2014-07-12 DIAGNOSIS — Z862 Personal history of diseases of the blood and blood-forming organs and certain disorders involving the immune mechanism: Secondary | ICD-10-CM

## 2014-07-12 DIAGNOSIS — R011 Cardiac murmur, unspecified: Secondary | ICD-10-CM | POA: Diagnosis not present

## 2014-07-12 DIAGNOSIS — E876 Hypokalemia: Secondary | ICD-10-CM

## 2014-07-12 DIAGNOSIS — E119 Type 2 diabetes mellitus without complications: Secondary | ICD-10-CM

## 2014-07-12 DIAGNOSIS — R000 Tachycardia, unspecified: Secondary | ICD-10-CM | POA: Diagnosis not present

## 2014-07-12 DIAGNOSIS — N179 Acute kidney failure, unspecified: Secondary | ICD-10-CM | POA: Diagnosis not present

## 2014-07-12 DIAGNOSIS — R7309 Other abnormal glucose: Secondary | ICD-10-CM | POA: Diagnosis not present

## 2014-07-12 DIAGNOSIS — F411 Generalized anxiety disorder: Secondary | ICD-10-CM | POA: Insufficient documentation

## 2014-07-12 DIAGNOSIS — R079 Chest pain, unspecified: Secondary | ICD-10-CM | POA: Diagnosis not present

## 2014-07-12 DIAGNOSIS — J45901 Unspecified asthma with (acute) exacerbation: Secondary | ICD-10-CM | POA: Insufficient documentation

## 2014-07-12 DIAGNOSIS — K219 Gastro-esophageal reflux disease without esophagitis: Secondary | ICD-10-CM | POA: Diagnosis present

## 2014-07-12 DIAGNOSIS — Z79899 Other long term (current) drug therapy: Secondary | ICD-10-CM | POA: Diagnosis not present

## 2014-07-12 DIAGNOSIS — R110 Nausea: Secondary | ICD-10-CM | POA: Insufficient documentation

## 2014-07-12 DIAGNOSIS — R072 Precordial pain: Secondary | ICD-10-CM | POA: Diagnosis not present

## 2014-07-12 DIAGNOSIS — J45909 Unspecified asthma, uncomplicated: Secondary | ICD-10-CM

## 2014-07-12 DIAGNOSIS — M129 Arthropathy, unspecified: Secondary | ICD-10-CM | POA: Insufficient documentation

## 2014-07-12 DIAGNOSIS — E1165 Type 2 diabetes mellitus with hyperglycemia: Secondary | ICD-10-CM

## 2014-07-12 DIAGNOSIS — F329 Major depressive disorder, single episode, unspecified: Secondary | ICD-10-CM | POA: Insufficient documentation

## 2014-07-12 DIAGNOSIS — I200 Unstable angina: Secondary | ICD-10-CM

## 2014-07-12 DIAGNOSIS — F3289 Other specified depressive episodes: Secondary | ICD-10-CM | POA: Diagnosis not present

## 2014-07-12 DIAGNOSIS — R739 Hyperglycemia, unspecified: Secondary | ICD-10-CM | POA: Diagnosis present

## 2014-07-12 DIAGNOSIS — Z8639 Personal history of other endocrine, nutritional and metabolic disease: Secondary | ICD-10-CM

## 2014-07-12 DIAGNOSIS — F332 Major depressive disorder, recurrent severe without psychotic features: Secondary | ICD-10-CM | POA: Diagnosis present

## 2014-07-12 LAB — I-STAT CHEM 8, ED
BUN: 12 mg/dL (ref 6–23)
Calcium, Ion: 1.13 mmol/L (ref 1.12–1.23)
Chloride: 98 mEq/L (ref 96–112)
Creatinine, Ser: 1.2 mg/dL — ABNORMAL HIGH (ref 0.50–1.10)
Glucose, Bld: 359 mg/dL — ABNORMAL HIGH (ref 70–99)
HCT: 46 % (ref 36.0–46.0)
Hemoglobin: 15.6 g/dL — ABNORMAL HIGH (ref 12.0–15.0)
Potassium: 3.1 mEq/L — ABNORMAL LOW (ref 3.7–5.3)
Sodium: 140 mEq/L (ref 137–147)
TCO2: 34 mmol/L (ref 0–100)

## 2014-07-12 LAB — BASIC METABOLIC PANEL
Anion gap: 17 — ABNORMAL HIGH (ref 5–15)
BUN: 13 mg/dL (ref 6–23)
CO2: 28 mEq/L (ref 19–32)
Calcium: 9.6 mg/dL (ref 8.4–10.5)
Chloride: 94 mEq/L — ABNORMAL LOW (ref 96–112)
Creatinine, Ser: 1.18 mg/dL — ABNORMAL HIGH (ref 0.50–1.10)
GFR calc Af Amer: 61 mL/min — ABNORMAL LOW (ref >90)
GFR calc non Af Amer: 52 mL/min — ABNORMAL LOW (ref >90)
Glucose, Bld: 350 mg/dL — ABNORMAL HIGH (ref 70–99)
Potassium: 3.2 mEq/L — ABNORMAL LOW (ref 3.7–5.3)
Sodium: 139 mEq/L (ref 137–147)

## 2014-07-12 LAB — CBC
HCT: 39.8 % (ref 36.0–46.0)
HCT: 38.2 % (ref 36.0–46.0)
Hemoglobin: 13.8 g/dL (ref 12.0–15.0)
Hemoglobin: 13.1 g/dL (ref 12.0–15.0)
MCH: 28.8 pg (ref 26.0–34.0)
MCH: 28.5 pg (ref 26.0–34.0)
MCHC: 34.7 g/dL (ref 30.0–36.0)
MCHC: 34.3 g/dL (ref 30.0–36.0)
MCV: 83.1 fL (ref 78.0–100.0)
MCV: 83 fL (ref 78.0–100.0)
Platelets: 294 10*3/uL (ref 150–400)
Platelets: 282 10*3/uL (ref 150–400)
RBC: 4.79 MIL/uL (ref 3.87–5.11)
RBC: 4.6 MIL/uL (ref 3.87–5.11)
RDW: 13.9 % (ref 11.5–15.5)
RDW: 14 % (ref 11.5–15.5)
WBC: 5.8 10*3/uL (ref 4.0–10.5)
WBC: 6 10*3/uL (ref 4.0–10.5)

## 2014-07-12 LAB — I-STAT TROPONIN, ED: Troponin i, poc: 0.01 ng/mL (ref 0.00–0.08)

## 2014-07-12 LAB — GLUCOSE, CAPILLARY: Glucose-Capillary: 256 mg/dL — ABNORMAL HIGH (ref 70–99)

## 2014-07-12 LAB — D-DIMER, QUANTITATIVE: D-Dimer, Quant: <0.27 ug/mL-FEU (ref 0.00–0.48)

## 2014-07-12 LAB — CREATININE, SERUM
Creatinine, Ser: 1.18 mg/dL — ABNORMAL HIGH (ref 0.50–1.10)
GFR calc Af Amer: 61 mL/min — ABNORMAL LOW (ref >90)
GFR calc non Af Amer: 52 mL/min — ABNORMAL LOW (ref >90)

## 2014-07-12 LAB — TROPONIN I: Troponin I: <0.3 ng/mL (ref <0.30)

## 2014-07-12 MED ORDER — ASPIRIN EC 325 MG PO TBEC
325.0000 mg | DELAYED_RELEASE_TABLET | Freq: Every day | ORAL | Status: DC
  Administered 2014-07-13: 325 mg via ORAL
  Filled 2014-07-12: qty 1

## 2014-07-12 MED ORDER — CLONIDINE HCL 0.2 MG PO TABS
0.2000 mg | ORAL_TABLET | Freq: Two times a day (BID) | ORAL | Status: DC
  Administered 2014-07-13 (×3): 0.2 mg via ORAL
  Filled 2014-07-12 (×5): qty 1

## 2014-07-12 MED ORDER — ACETAMINOPHEN 325 MG PO TABS
650.0000 mg | ORAL_TABLET | ORAL | Status: DC | PRN
  Administered 2014-07-13: 650 mg via ORAL
  Filled 2014-07-12: qty 2

## 2014-07-12 MED ORDER — SODIUM CHLORIDE 0.45 % IV SOLN
INTRAVENOUS | Status: DC
  Administered 2014-07-13 (×3): via INTRAVENOUS

## 2014-07-12 MED ORDER — FAMOTIDINE 20 MG PO TABS
20.0000 mg | ORAL_TABLET | Freq: Two times a day (BID) | ORAL | Status: DC
  Administered 2014-07-13 – 2014-07-15 (×6): 20 mg via ORAL
  Filled 2014-07-12 (×7): qty 1

## 2014-07-12 MED ORDER — TRIAMTERENE-HCTZ 75-50 MG PO TABS
1.0000 | ORAL_TABLET | Freq: Every day | ORAL | Status: DC
  Administered 2014-07-13 – 2014-07-15 (×3): 1 via ORAL
  Filled 2014-07-12 (×3): qty 1

## 2014-07-12 MED ORDER — HEPARIN SODIUM (PORCINE) 5000 UNIT/ML IJ SOLN
5000.0000 [IU] | Freq: Three times a day (TID) | INTRAMUSCULAR | Status: DC
  Administered 2014-07-13 – 2014-07-14 (×6): 5000 [IU] via SUBCUTANEOUS
  Filled 2014-07-12 (×11): qty 1

## 2014-07-12 MED ORDER — ALBUTEROL SULFATE HFA 108 (90 BASE) MCG/ACT IN AERS: 2.0000 | INHALATION_SPRAY | Freq: Four times a day (QID) | RESPIRATORY_TRACT | Status: DC | PRN

## 2014-07-12 MED ORDER — ONDANSETRON HCL 4 MG/2ML IJ SOLN: 4.0000 mg | Freq: Four times a day (QID) | INTRAMUSCULAR | Status: DC | PRN

## 2014-07-12 MED ORDER — INSULIN ASPART 100 UNIT/ML ~~LOC~~ SOLN
0.0000 [IU] | Freq: Three times a day (TID) | SUBCUTANEOUS | Status: DC
  Administered 2014-07-13: 3 [IU] via SUBCUTANEOUS

## 2014-07-12 MED ORDER — SODIUM CHLORIDE 0.9 % IV BOLUS (SEPSIS)
1000.0000 mL | Freq: Once | INTRAVENOUS | Status: AC
  Administered 2014-07-13: 1000 mL via INTRAVENOUS

## 2014-07-12 MED ORDER — POTASSIUM CHLORIDE CRYS ER 20 MEQ PO TBCR
40.0000 meq | EXTENDED_RELEASE_TABLET | Freq: Once | ORAL | Status: AC
  Administered 2014-07-12: 40 meq via ORAL
  Filled 2014-07-12: qty 2

## 2014-07-12 MED ORDER — TRAMADOL HCL 50 MG PO TABS
50.0000 mg | ORAL_TABLET | Freq: Four times a day (QID) | ORAL | Status: DC | PRN
  Administered 2014-07-13 (×2): 50 mg via ORAL
  Filled 2014-07-12 (×2): qty 1

## 2014-07-12 MED ORDER — ALBUTEROL SULFATE (2.5 MG/3ML) 0.083% IN NEBU
3.0000 mL | INHALATION_SOLUTION | Freq: Four times a day (QID) | RESPIRATORY_TRACT | Status: DC | PRN
  Administered 2014-07-14: 3 mL via RESPIRATORY_TRACT
  Filled 2014-07-12: qty 3

## 2014-07-12 MED ORDER — HEPARIN SODIUM (PORCINE) 5000 UNIT/ML IJ SOLN: 5000.0000 [IU] | Freq: Three times a day (TID) | INTRAMUSCULAR | Status: DC

## 2014-07-12 NOTE — H&P (Signed)
[facility] History and Physical  Heather Walker PIR:[id number] DOB: 02/12/1961 DOA: 07/12/2014  Referring physician: Dr. Winfred Walker PCP: Heather Koyanagi, Walker  Specialists: none  Chief Complaint: fast heart beat and SOB  Assessment/Plan Active Problems:   HYPERTENSION   ASTHMA   REFLUX, ESOPHAGEAL   HYPOKALEMIA, HX OF   Tachycardia   Chest pain   Diabetes mellitus   Acute kidney injury  Chest Pain: Unstable angina concerning for cardiac etiology. HEART score 4.  - Admit to Tele for CP obs - Cycle troponins - ASA - Nitro PRN - consider cards consult if troponins are positive  Diabetes: new Dx. Random glucose on admission of 359 diagnostic.  - Hgb A1c - SSI - thin - outpt f/u for orals and lifestyle modification   Acute Kidney injury: Cr 1.2 on admission. Baseline Cr 1. Likely secondary to hyperglycemia/DM and HTN. - 1L NS bolus - IVF 1/2NS 162ml/hr -  BMET in am  HTN: Range 132-153/87-96.  - Continue home regimen  Tachycardia: sinus tach on arrival. Resolved w/ rest. - Monitor  Hypokalemia: Pt chronically low. 3.1 on admission. Given Kdur 66mEq in ED - BMET in am, replete as necessary  GERD: well controlled - cont home pepcid  Asthma: No exacerbation - Albuterol PRN  DVT Prophylaxis: Hep  TID Code Status: FULL Family Communication: Husband present for admission Disposition Plan: pending CP r/o  HPI: Heather Walker is a 53 y.o. female came [facility] ed 07/12/2014 with  2 wks of CP. Episodes would come on at rest or w/ exertion. Associated w/ nausea, SOB, fatigue, diaphoresis. Last 15-20 min typically. Improve w/ laying down. Out of clonidine for a couple of days. Denies cough, syncope, dysuria, frequency. Called EMS today when episode not relieved w/ rest. EMS gave ASA and nitro w/ relief. Episodes coming on w/ greater frequency. Occuring daily.   Review of Systems: Per HPI w/ all other systems negative.   Past Medical History  Diagnosis Date  .  Hypertension   . Depression   . Asthma   . Arthritis   . Heart murmur    Past Surgical History  Procedure Laterality Date  . Abdominal hysterectomy    . Bladder suspension    . Appendectomy    . Cesarean section      3 previous  . Tubal ligation     Social History:  History   Social History Narrative  . No narrative on file    Allergies  Allergen Reactions  . Codeine Itching    Family History  Problem Relation Age of Onset  . Hypertension Mother   . Asthma Mother   . Hypertension Sister   . Asthma Sister   . Hypertension Maternal Grandmother   . Heart defect Sister   . Asthma Sister   . Aneurysm Sister   . Asthma Sister      Prior to Admission medications   Medication Sig Start Date End Date Taking? Authorizing Provider  albuterol (PROVENTIL HFA;VENTOLIN HFA) 108 (90 BASE) MCG/ACT inhaler Inhale 2 puffs into the lungs every 6 (six) hours as needed. For shortness of breath. 07/11/14  Yes Heather Walker  cloNIDine (CATAPRES) 0.2 MG tablet Take 0.2 mg by mouth 2 (two) times daily.   Yes Historical Provider, MD  famotidine (PEPCID) 20 MG tablet Take 1 tablet (20 mg total) by mouth 2 (two) times daily. 03/08/14  Yes Heather Acosta, MD  traMADol (ULTRAM) 50 MG tablet Take 50 mg by mouth every  6 (six) hours as needed for moderate pain.   Yes Historical Provider, MD  triamterene-hydrochlorothiazide (MAXZIDE) 75-50 MG per tablet Take 1 tablet by mouth daily. 07/11/14  Yes Heather Chessman, Walker   Physical Exam: Filed Vitals:   07/12/14 2130 07/12/14 2141 07/12/14 2230 07/12/14 2239  BP: 132/87 132/87 144/96 144/96  Pulse: 106  93   Temp:      TempSrc:      Resp: 20 16 14 14   SpO2: 95% 96% 97% 97%     General:  Resting, NAD  Eyes: PERRL, EOMI   ENT: mmm,  Neck: FROM  Cardiovascular: RRR, no m/r/g  Respiratory: Nml WOB. No wheezes, ronchi. Good breath sounds throughout  Abdomen: NABS, nonttp  Skin:  warm, well perfused, intact  Musculoskeletal: No LE  edema, moves all extremities spontaneously, 2+ distal pulses  Psychiatric: nml affect  Neurologic: CN2-12 Grossly intact, cerebellar fxn nml  Labs on Admission:  Basic Metabolic Panel:  Recent Labs Lab 07/11/14 1402 07/12/14 2013 07/12/14 2047  NA 134* 139 140  K 2.8* 3.2* 3.1*  CL 95* 94* 98  CO2 30 28  --   GLUCOSE 234* 350* 359*  BUN 11 13 12   CREATININE 1.0 1.18* 1.20*  CALCIUM 9.5 9.6  --    Liver Function Tests:  Recent Labs Lab 07/11/14 1402  AST 26  ALT 24  ALKPHOS 97  BILITOT 0.4  PROT 8.8*  ALBUMIN 4.2   No results found for this basename: LIPASE, AMYLASE,  in the last 168 hours No results found for this basename: AMMONIA,  in the last 168 hours CBC:  Recent Labs Lab 07/12/14 2013 07/12/14 2047  WBC 5.8  --   HGB 13.8 15.6*  HCT 39.8 46.0  MCV 83.1  --   PLT 294  --    Cardiac Enzymes: No results found for this basename: CKTOTAL, CKMB, CKMBINDEX, TROPONINI,  in the last 168 hours  BNP (last 3 results) No results found for this basename: PROBNP,  in the last 8760 hours CBG: No results found for this basename: GLUCAP,  in the last 168 hours  Radiological Exams on Admission: Dg Chest Portable 1 View  07/12/2014   CLINICAL DATA:  Chest pain and tachycardia starting yesterday.  EXAM: PORTABLE CHEST - 1 VIEW  COMPARISON:  03/08/2014  FINDINGS: The heart size and mediastinal contours are within normal limits. Both lungs are clear. The visualized skeletal structures are unremarkable.  IMPRESSION: No active disease.   Electronically Signed   By: Lucienne Capers M.D.   On: 07/12/2014 21:15       Time spent: >70 min ind direct pt care adn coordination.    Jovonni Borquez, Grayling Congress, MD [facility] [email or web address] Password [facility] 07/12/2014, 10:55 PM

## 2014-07-12 NOTE — ED Provider Notes (Addendum)
CSN: [id number]     Arrival date & time 07/12/14  1944 History   First MD Initiated Contact with Patient 07/12/14 2010     Chief Complaint  Patient presents with  . Chest Pain     (Consider location/radiation/quality/duration/timing/severity/associated sxs/prior Treatment) HPI Complains of left anterior chest pain onset one week ago. Pain is intermittent lasts prostate tenderness a time. Associated symptoms include shortness of breath, palpitations nausea and sweatiness. She is presently pain-free since treatment by EMS with one sublingual nitroglycerin and 4 baby aspirin. No other associated symptoms.  Past Medical History  Diagnosis Date  . Hypertension   . Depression   . Asthma   . Arthritis   . Heart murmur    Past Surgical History  Procedure Laterality Date  . Abdominal hysterectomy    . Bladder suspension    . Appendectomy    . Cesarean section      3 previous  . Tubal ligation     Family History  Problem Relation Age of Onset  . Hypertension Mother   . Asthma Mother   . Hypertension Sister   . Asthma Sister   . Hypertension Maternal Grandmother   . Heart defect Sister   . Asthma Sister   . Aneurysm Sister   . Asthma Sister    History  Substance Use Topics  . Smoking status: Never Smoker   . Smokeless tobacco: Never Used  . Alcohol Use: No   cardiac risk factors hypertension otherwise negative OB History   Grav Para Term Preterm Abortions TAB SAB Ect Mult Living   3 3 3       3      Review of Systems  Respiratory: Positive for shortness of breath.   Cardiovascular: Positive for chest pain.  Gastrointestinal: Positive for nausea.  All other systems reviewed and are negative.     Allergies  Codeine  Home Medications   Prior to Admission medications   Medication Sig Start Date End Date Taking? Authorizing Provider  albuterol (PROVENTIL HFA;VENTOLIN HFA) 108 (90 BASE) MCG/ACT inhaler Inhale 2 puffs into the lungs every 6 (six) hours as needed. For  shortness of breath. 07/11/14   Rosalita Chessman, DO  cloNIDine (CATAPRES) 0.2 MG tablet take 1 tablet by mouth twice a day 07/11/14   Rosalita Chessman, DO  famotidine (PEPCID) 20 MG tablet Take 1 tablet (20 mg total) by mouth 2 (two) times daily. 03/08/14   Johnna Acosta, MD  omeprazole (PRILOSEC) 20 MG capsule Take 1 capsule (20 mg total) by mouth daily. 05/28/14   Meriam Sprague Saguier, PA-C  potassium chloride SA (K-DUR,KLOR-CON) 20 MEQ tablet Take 1 tablet (20 mEq total) by mouth daily. 07/11/14   Rosalita Chessman, DO  traMADol (ULTRAM) 50 MG tablet Take 1 tablet (50 mg total) by mouth every 6 (six) hours as needed. 07/11/14   Rosalita Chessman, DO  triamterene-hydrochlorothiazide (MAXZIDE) 75-50 MG per tablet Take 1 tablet by mouth daily. 07/11/14   Yvonne R Lowne, DO   BP 153/107  Pulse 110  Temp(Src) 98.9 F (37.2 C) (Oral)  Resp 18  SpO2 96% Physical Exam  Nursing note and vitals reviewed. Constitutional: She appears well-developed and well-nourished.  Anxious appearing  HENT:  Head: Normocephalic and atraumatic.  Eyes: Conjunctivae are normal. Pupils are equal, round, and reactive to light.  Neck: Neck supple. No tracheal deviation present. No thyromegaly present.  Cardiovascular: Regular rhythm.   No murmur heard. Tachycardic  Pulmonary/Chest: Effort normal and breath  sounds normal.  Abdominal: Soft. Bowel sounds are normal. She exhibits no distension. There is no tenderness.  OBese  Musculoskeletal: Normal range of motion. She exhibits no edema and no tenderness.  Neurological: She is alert. Coordination normal.  Skin: Skin is warm and dry. No rash noted.  Psychiatric: She has a normal mood and affect.    ED Course  Procedures (including critical care time) Labs Review Labs Reviewed  BASIC METABOLIC PANEL - Abnormal; Notable for the following:    Potassium 3.2 (*)    Chloride 94 (*)    Glucose, Bld 350 (*)    Creatinine, Ser 1.18 (*)    GFR calc non Af Amer 52 (*)    GFR calc Af  Amer 61 (*)    Anion gap 17 (*)    All other components within normal limits  I-STAT CHEM 8, ED - Abnormal; Notable for the following:    Potassium 3.1 (*)    Creatinine, Ser 1.20 (*)    Glucose, Bld 359 (*)    Hemoglobin 15.6 (*)    All other components within normal limits  CBC  D-DIMER, QUANTITATIVE  I-STAT TROPOININ, ED    Imaging Review No results found.   EKG Interpretation   Date/Time:  Saturday July 12 2014 19:51:27 EDT Ventricular Rate:  112 PR Interval:  162 QRS Duration: 85 QT Interval:  375 QTC Calculation: 512 R Axis:   -57 Text Interpretation:  Sinus tachycardia Left anterior fascicular block  Abnormal R-wave progression, early transition Left ventricular hypertrophy  Prolonged QT interval SINCE LAST TRACING HEART RATE HAS INCREASED  Confirmed by Winfred Leeds  MD, Nashton Belson ([id number]) on 07/12/2014 7:58:50 PM       10:10 AM patient resting comfortably. Asymptomatic. Chest x-ray viewed by me. Results for orders placed during the hospital encounter of 07/12/14  CBC      Result Value Ref Range   WBC 5.8  4.0 - 10.5 K/uL   RBC 4.79  3.87 - 5.11 MIL/uL   Hemoglobin 13.8  12.0 - 15.0 g/dL   HCT 39.8  36.0 - 46.0 %   MCV 83.1  78.0 - 100.0 fL   MCH 28.8  26.0 - 34.0 pg   MCHC 34.7  30.0 - 36.0 g/dL   RDW 13.9  11.5 - 15.5 %   Platelets 294  150 - 400 K/uL  BASIC METABOLIC PANEL      Result Value Ref Range   Sodium 139  137 - 147 mEq/L   Potassium 3.2 (*) 3.7 - 5.3 mEq/L   Chloride 94 (*) 96 - 112 mEq/L   CO2 28  19 - 32 mEq/L   Glucose, Bld 350 (*) 70 - 99 mg/dL   BUN 13  6 - 23 mg/dL   Creatinine, Ser 1.18 (*) 0.50 - 1.10 mg/dL   Calcium 9.6  8.4 - 10.5 mg/dL   GFR calc non Af Amer 52 (*) >90 mL/min   GFR calc Af Amer 61 (*) >90 mL/min   Anion gap 17 (*) 5 - 15  D-DIMER, QUANTITATIVE      Result Value Ref Range   D-Dimer, Quant <0.27  0.00 - 0.48 ug/mL-FEU  I-STAT TROPOININ, ED      Result Value Ref Range   Troponin i, poc 0.01  0.00 - 0.08 ng/mL    Comment 3           I-STAT CHEM 8, ED      Result Value Ref Range   Sodium 140  137 - 147 mEq/L   Potassium 3.1 (*) 3.7 - 5.3 mEq/L   Chloride 98  96 - 112 mEq/L   BUN 12  6 - 23 mg/dL   Creatinine, Ser 1.20 (*) 0.50 - 1.10 mg/dL   Glucose, Bld 359 (*) 70 - 99 mg/dL   Calcium, Ion 1.13  1.12 - 1.23 mmol/L   TCO2 34  0 - 100 mmol/L   Hemoglobin 15.6 (*) 12.0 - 15.0 g/dL   HCT 46.0  36.0 - 46.0 %   Dg Chest Portable 1 View  07/12/2014   CLINICAL DATA:  Chest pain and tachycardia starting yesterday.  EXAM: PORTABLE CHEST - 1 VIEW  COMPARISON:  03/08/2014  FINDINGS: The heart size and mediastinal contours are within normal limits. Both lungs are clear. The visualized skeletal structures are unremarkable.  IMPRESSION: No active disease.   Electronically Signed   By: Lucienne Capers M.D.   On: 07/12/2014 21:15     MDM   Final diagnoses:  None   concern for angina based on symptoms. Low pretest clinical probability for pulmonary embolus. Negative d-dimer. Heart score was 4 Patient newly diagnosed today as diabetic. Spoke with Dr. Barbaraann Faster. Plan 23 hour observation telemetry Diagnosis #1 chest pain #2 hyperglycemia #3 hypoklaemia   Orlie Dakin, MD 07/12/14 [id number]  Orlie Dakin, MD 07/12/14 2215

## 2014-07-12 NOTE — ED Notes (Signed)
Pt arrived via ems c/o left sided cp for one week. States sharp and reproducible with movement, +sob,+nausea, +fever (per pt), diaphoresis (per pt). Out of clonadine for one week restarted today. 4 baby asa and 1 ntg given en route.

## 2014-07-13 ENCOUNTER — Encounter (HOSPITAL_COMMUNITY): Payer: Self-pay | Admitting: Cardiology

## 2014-07-13 DIAGNOSIS — F329 Major depressive disorder, single episode, unspecified: Secondary | ICD-10-CM | POA: Diagnosis not present

## 2014-07-13 DIAGNOSIS — R739 Hyperglycemia, unspecified: Secondary | ICD-10-CM | POA: Diagnosis present

## 2014-07-13 DIAGNOSIS — R072 Precordial pain: Secondary | ICD-10-CM | POA: Diagnosis not present

## 2014-07-13 DIAGNOSIS — R7309 Other abnormal glucose: Secondary | ICD-10-CM | POA: Diagnosis not present

## 2014-07-13 DIAGNOSIS — I200 Unstable angina: Secondary | ICD-10-CM | POA: Diagnosis not present

## 2014-07-13 DIAGNOSIS — R000 Tachycardia, unspecified: Secondary | ICD-10-CM | POA: Diagnosis not present

## 2014-07-13 DIAGNOSIS — F3289 Other specified depressive episodes: Secondary | ICD-10-CM | POA: Diagnosis not present

## 2014-07-13 DIAGNOSIS — I10 Essential (primary) hypertension: Secondary | ICD-10-CM | POA: Diagnosis not present

## 2014-07-13 DIAGNOSIS — R079 Chest pain, unspecified: Secondary | ICD-10-CM | POA: Diagnosis not present

## 2014-07-13 LAB — HEMOGLOBIN A1C
Hgb A1c MFr Bld: 9.4 % — ABNORMAL HIGH (ref <5.7)
Mean Plasma Glucose: 223 mg/dL — ABNORMAL HIGH (ref <117)

## 2014-07-13 LAB — CBC
HCT: 35.9 % — ABNORMAL LOW (ref 36.0–46.0)
Hemoglobin: 12.2 g/dL (ref 12.0–15.0)
MCH: 28.3 pg (ref 26.0–34.0)
MCHC: 34 g/dL (ref 30.0–36.0)
MCV: 83.3 fL (ref 78.0–100.0)
Platelets: 240 10*3/uL (ref 150–400)
RBC: 4.31 MIL/uL (ref 3.87–5.11)
RDW: 13.9 % (ref 11.5–15.5)
WBC: 4.8 10*3/uL (ref 4.0–10.5)

## 2014-07-13 LAB — LIPID PANEL
Cholesterol: 149 mg/dL (ref 0–200)
HDL: 43 mg/dL (ref >39)
LDL Cholesterol: 62 mg/dL (ref 0–99)
Total CHOL/HDL Ratio: 3.5 RATIO
Triglycerides: 218 mg/dL — ABNORMAL HIGH (ref <150)
VLDL: 44 mg/dL — ABNORMAL HIGH (ref 0–40)

## 2014-07-13 LAB — BASIC METABOLIC PANEL
Anion gap: 14 (ref 5–15)
BUN: 14 mg/dL (ref 6–23)
CO2: 27 mEq/L (ref 19–32)
Calcium: 8.5 mg/dL (ref 8.4–10.5)
Chloride: 97 mEq/L (ref 96–112)
Creatinine, Ser: 0.96 mg/dL (ref 0.50–1.10)
GFR calc Af Amer: 78 mL/min — ABNORMAL LOW (ref >90)
GFR calc non Af Amer: 67 mL/min — ABNORMAL LOW (ref >90)
Glucose, Bld: 218 mg/dL — ABNORMAL HIGH (ref 70–99)
Potassium: 3.7 mEq/L (ref 3.7–5.3)
Sodium: 138 mEq/L (ref 137–147)

## 2014-07-13 LAB — GLUCOSE, CAPILLARY
Glucose-Capillary: 136 mg/dL — ABNORMAL HIGH (ref 70–99)
Glucose-Capillary: 203 mg/dL — ABNORMAL HIGH (ref 70–99)
Glucose-Capillary: 210 mg/dL — ABNORMAL HIGH (ref 70–99)
Glucose-Capillary: 233 mg/dL — ABNORMAL HIGH (ref 70–99)
Glucose-Capillary: 233 mg/dL — ABNORMAL HIGH (ref 70–99)

## 2014-07-13 LAB — TSH: TSH: 1.36 u[IU]/mL (ref 0.350–4.500)

## 2014-07-13 LAB — MAGNESIUM: Magnesium: 1.7 mg/dL (ref 1.5–2.5)

## 2014-07-13 LAB — TROPONIN I
Troponin I: <0.3 ng/mL
Troponin I: <0.3 ng/mL (ref <0.30)

## 2014-07-13 MED ORDER — MORPHINE SULFATE 2 MG/ML IJ SOLN: 2.0000 mg | INTRAMUSCULAR | Status: DC | PRN

## 2014-07-13 MED ORDER — ASPIRIN EC 81 MG PO TBEC
81.0000 mg | DELAYED_RELEASE_TABLET | Freq: Every day | ORAL | Status: DC
  Administered 2014-07-14 – 2014-07-15 (×2): 81 mg via ORAL
  Filled 2014-07-13 (×2): qty 1

## 2014-07-13 MED ORDER — MAGNESIUM SULFATE 40 MG/ML IJ SOLN
2.0000 g | Freq: Once | INTRAMUSCULAR | Status: AC
Start: 2014-07-13 — End: 2014-07-13
  Administered 2014-07-13: 2 g via INTRAVENOUS
  Filled 2014-07-13: qty 50

## 2014-07-13 MED ORDER — NITROGLYCERIN 0.3 MG SL SUBL
0.3000 mg | SUBLINGUAL_TABLET | SUBLINGUAL | Status: DC | PRN
  Filled 2014-07-13: qty 100

## 2014-07-13 MED ORDER — INSULIN ASPART 100 UNIT/ML ~~LOC~~ SOLN: 0.0000 [IU] | Freq: Four times a day (QID) | SUBCUTANEOUS | Status: DC

## 2014-07-13 MED ORDER — INSULIN ASPART 100 UNIT/ML ~~LOC~~ SOLN
0.0000 [IU] | SUBCUTANEOUS | Status: DC
  Administered 2014-07-13: 1 [IU] via SUBCUTANEOUS
  Administered 2014-07-13 – 2014-07-14 (×4): 3 [IU] via SUBCUTANEOUS

## 2014-07-13 MED ORDER — NITROGLYCERIN 0.4 MG SL SUBL
0.4000 mg | SUBLINGUAL_TABLET | SUBLINGUAL | Status: DC | PRN
  Administered 2014-07-13 (×2): 0.4 mg via SUBLINGUAL
  Filled 2014-07-13: qty 1

## 2014-07-13 NOTE — Progress Notes (Signed)
Pt states she is NOT Diabetic, has never been told that she is at risk for Diabetes.  Hgb A1C=9.4; pt made aware.  Pt understands that she is probably going to leave the hospital with a new diagnosis of Diabetes.  She is concerned about this.  Handouts given on HGB A1C and Type 2 DM.

## 2014-07-13 NOTE — Progress Notes (Signed)
  Echocardiogram 2D Echocardiogram has been performed.  Heather Walker 07/13/2014, 10:18 AM

## 2014-07-13 NOTE — Consult Note (Signed)
Reason for Consult: Chest pain   Referring Physician: Dr. Grandville Silos   BDZ:HGDJME Etter Sjogren, DO Primary Cardiologist:new  Heather Walker is an 53 y.o. female.    Chief Complaint: chest pain   HPI: 53 y.o. female came to [facility] ED 07/12/2014 with 2 wks of CP. Pressure Lt ant chest but could be mid sternal at times. Episodes would come on at rest or w/ exertion. Associated w/ nausea, SOB, fatigue, diaphoresis. Last 15-20 min typically. Improve w/ laying down. Out of clonidine for a couple of days. Denied cough, syncope, dysuria, frequency. Called EMS today when episode not relieved w/ rest. EMS gave ASA and nitro w/ relief. Episodes coming on w/ greater frequency. Occuring daily.  She also complained of SOB that occurred prior to that time.  She was having to sleep in a recliner at night, she does have a history of asthma. On admit she had rapid heart rate as well.  She did feel very lightheaded with that.  No prior cardiac history.  Hypokalemia with K+ of 2.8 on admit.   Echo this admit:  - Left ventricle: The cavity size was normal. Systolic function was normal. The estimated ejection fraction was in the range of 60% to 65%. Wall motion was normal; there were no regional wall motion abnormalities.  Troponin negative X 3 EKG SR with t wave inversion in II,III, AVF and in V1-3.  She had recurrent pain today relief with SL NTG. No pain currently.  She feels tired, not much energy.   Past Medical History  Diagnosis Date  . Hypertension   . Depression   . Asthma   . Arthritis   . Heart murmur     Past Surgical History  Procedure Laterality Date  . Abdominal hysterectomy    . Bladder suspension    . Appendectomy    . Cesarean section      3 previous  . Tubal ligation      Family History  Problem Relation Age of Onset  . Hypertension Mother   . Asthma Mother   . Hypertension Sister   . Asthma Sister   . Hypertension Maternal Grandmother   . Heart defect Sister    . Asthma Sister   . Aneurysm Sister   . Asthma Sister    Social History:  reports that she has never smoked. She has never used smokeless tobacco. She reports that she does not drink alcohol or use illicit drugs.  Allergies:  Allergies  Allergen Reactions  . Codeine Itching    Medications Prior to Admission  Medication Sig Dispense Refill  . albuterol (PROVENTIL HFA;VENTOLIN HFA) 108 (90 BASE) MCG/ACT inhaler Inhale 2 puffs into the lungs every 6 (six) hours as needed. For shortness of breath.  1 Inhaler  4  . cloNIDine (CATAPRES) 0.2 MG tablet Take 0.2 mg by mouth 2 (two) times daily.      . famotidine (PEPCID) 20 MG tablet Take 1 tablet (20 mg total) by mouth 2 (two) times daily.  30 tablet  0  . traMADol (ULTRAM) 50 MG tablet Take 50 mg by mouth every 6 (six) hours as needed for moderate pain.      Marland Kitchen triamterene-hydrochlorothiazide (MAXZIDE) 75-50 MG per tablet Take 1 tablet by mouth daily.  90 tablet  1    Results for orders placed during the hospital encounter of 07/12/14 (from the past 48 hour(s))  CBC     Status: None   Collection  Time    07/12/14  8:13 PM      Result Value Ref Range   WBC 5.8  4.0 - 10.5 K/uL   RBC 4.79  3.87 - 5.11 MIL/uL   Hemoglobin 13.8  12.0 - 15.0 g/dL   HCT 39.8  36.0 - 46.0 %   MCV 83.1  78.0 - 100.0 fL   MCH 28.8  26.0 - 34.0 pg   MCHC 34.7  30.0 - 36.0 g/dL   RDW 13.9  11.5 - 15.5 %   Platelets 294  150 - 400 K/uL  BASIC METABOLIC PANEL     Status: Abnormal   Collection Time    07/12/14  8:13 PM      Result Value Ref Range   Sodium 139  137 - 147 mEq/L   Potassium 3.2 (*) 3.7 - 5.3 mEq/L   Chloride 94 (*) 96 - 112 mEq/L   CO2 28  19 - 32 mEq/L   Glucose, Bld 350 (*) 70 - 99 mg/dL   BUN 13  6 - 23 mg/dL   Creatinine, Ser 1.18 (*) 0.50 - 1.10 mg/dL   Calcium 9.6  8.4 - 10.5 mg/dL   GFR calc non Af Amer 52 (*) >90 mL/min   GFR calc Af Amer 61 (*) >90 mL/min   Comment: (NOTE)     The eGFR has been calculated using the CKD EPI  equation.     This calculation has not been validated in all clinical situations.     eGFR's persistently <90 mL/min signify possible Chronic Kidney     Disease.   Anion gap 17 (*) 5 - 15  I-STAT TROPOININ, ED     Status: None   Collection Time    07/12/14  8:15 PM      Result Value Ref Range   Troponin i, poc 0.01  0.00 - 0.08 ng/mL   Comment 3            Comment: Due to the release kinetics of cTnI,     a negative result within the first hours     of the onset of symptoms does not rule out     myocardial infarction with certainty.     If myocardial infarction is still suspected,     repeat the test at appropriate intervals.  D-DIMER, QUANTITATIVE     Status: None   Collection Time    07/12/14  8:15 PM      Result Value Ref Range   D-Dimer, Quant <0.27  0.00 - 0.48 ug/mL-FEU   Comment:            AT THE INHOUSE ESTABLISHED CUTOFF     VALUE OF 0.48 ug/mL FEU,     THIS ASSAY HAS BEEN DOCUMENTED     IN THE LITERATURE TO HAVE     A SENSITIVITY AND NEGATIVE     PREDICTIVE VALUE OF AT LEAST     98 TO 99%.  THE TEST RESULT     SHOULD BE CORRELATED WITH     AN ASSESSMENT OF THE CLINICAL     PROBABILITY OF DVT / VTE.  I-STAT CHEM 8, ED     Status: Abnormal   Collection Time    07/12/14  8:47 PM      Result Value Ref Range   Sodium 140  137 - 147 mEq/L   Potassium 3.1 (*) 3.7 - 5.3 mEq/L   Chloride 98  96 - 112 mEq/L   BUN 12  6 - 23 mg/dL   Creatinine, Ser 1.20 (*) 0.50 - 1.10 mg/dL   Glucose, Bld 359 (*) 70 - 99 mg/dL   Calcium, Ion 1.13  1.12 - 1.23 mmol/L   TCO2 34  0 - 100 mmol/L   Hemoglobin 15.6 (*) 12.0 - 15.0 g/dL   HCT 46.0  36.0 - 46.0 %  TROPONIN I     Status: None   Collection Time    07/12/14 11:14 PM      Result Value Ref Range   Troponin I <0.30  <0.30 ng/mL   Comment:            Due to the release kinetics of cTnI,     a negative result within the first hours     of the onset of symptoms does not rule out     myocardial infarction with certainty.      If myocardial infarction is still suspected,     repeat the test at appropriate intervals.  CBC     Status: None   Collection Time    07/12/14 11:16 PM      Result Value Ref Range   WBC 6.0  4.0 - 10.5 K/uL   RBC 4.60  3.87 - 5.11 MIL/uL   Hemoglobin 13.1  12.0 - 15.0 g/dL   HCT 38.2  36.0 - 46.0 %   MCV 83.0  78.0 - 100.0 fL   MCH 28.5  26.0 - 34.0 pg   MCHC 34.3  30.0 - 36.0 g/dL   RDW 14.0  11.5 - 15.5 %   Platelets 282  150 - 400 K/uL  CREATININE, SERUM     Status: Abnormal   Collection Time    07/12/14 11:16 PM      Result Value Ref Range   Creatinine, Ser 1.18 (*) 0.50 - 1.10 mg/dL   GFR calc non Af Amer 52 (*) >90 mL/min   GFR calc Af Amer 61 (*) >90 mL/min   Comment: (NOTE)     The eGFR has been calculated using the CKD EPI equation.     This calculation has not been validated in all clinical situations.     eGFR's persistently <90 mL/min signify possible Chronic Kidney     Disease.  GLUCOSE, CAPILLARY     Status: Abnormal   Collection Time    07/12/14 11:52 PM      Result Value Ref Range   Glucose-Capillary 256 (*) 70 - 99 mg/dL  TROPONIN I     Status: None   Collection Time    07/13/14  1:46 AM      Result Value Ref Range   Troponin I <0.30  <0.30 ng/mL   Comment:            Due to the release kinetics of cTnI,     a negative result within the first hours     of the onset of symptoms does not rule out     myocardial infarction with certainty.     If myocardial infarction is still suspected,     repeat the test at appropriate intervals.  TROPONIN I     Status: None   Collection Time    07/13/14  5:23 AM      Result Value Ref Range   Troponin I <0.30  <0.30 ng/mL   Comment:            Due to the release kinetics of cTnI,     a negative result within  the first hours     of the onset of symptoms does not rule out     myocardial infarction with certainty.     If myocardial infarction is still suspected,     repeat the test at appropriate intervals.  BASIC  METABOLIC PANEL     Status: Abnormal   Collection Time    07/13/14  5:23 AM      Result Value Ref Range   Sodium 138  137 - 147 mEq/L   Potassium 3.7  3.7 - 5.3 mEq/L   Chloride 97  96 - 112 mEq/L   CO2 27  19 - 32 mEq/L   Glucose, Bld 218 (*) 70 - 99 mg/dL   BUN 14  6 - 23 mg/dL   Creatinine, Ser 0.96  0.50 - 1.10 mg/dL   Calcium 8.5  8.4 - 10.5 mg/dL   GFR calc non Af Amer 67 (*) >90 mL/min   GFR calc Af Amer 78 (*) >90 mL/min   Comment: (NOTE)     The eGFR has been calculated using the CKD EPI equation.     This calculation has not been validated in all clinical situations.     eGFR's persistently <90 mL/min signify possible Chronic Kidney     Disease.   Anion gap 14  5 - 15  CBC     Status: Abnormal   Collection Time    07/13/14  5:23 AM      Result Value Ref Range   WBC 4.8  4.0 - 10.5 K/uL   RBC 4.31  3.87 - 5.11 MIL/uL   Hemoglobin 12.2  12.0 - 15.0 g/dL   HCT 35.9 (*) 36.0 - 46.0 %   MCV 83.3  78.0 - 100.0 fL   MCH 28.3  26.0 - 34.0 pg   MCHC 34.0  30.0 - 36.0 g/dL   RDW 13.9  11.5 - 15.5 %   Platelets 240  150 - 400 K/uL  HEMOGLOBIN A1C     Status: Abnormal   Collection Time    07/13/14  5:23 AM      Result Value Ref Range   Hemoglobin A1C 9.4 (*) <5.7 %   Comment: (NOTE)                                                                               According to the ADA Clinical Practice Recommendations for 2011, when     HbA1c is used as a screening test:      >=6.5%   Diagnostic of Diabetes Mellitus               (if abnormal result is confirmed)     5.7-6.4%   Increased risk of developing Diabetes Mellitus     References:Diagnosis and Classification of Diabetes Mellitus,Diabetes     DEYC,1448,18(HUDJS 1):S62-S69 and Standards of Medical Care in             Diabetes - 2011,Diabetes Care,2011,34 (Suppl 1):S11-S61.   Mean Plasma Glucose 223 (*) <117 mg/dL   Comment: Performed at [facility]     Status: None   Collection Time    07/13/14   5:23 AM  Result Value Ref Range   Magnesium 1.7  1.5 - 2.5 mg/dL  LIPID PANEL     Status: Abnormal   Collection Time    07/13/14  5:23 AM      Result Value Ref Range   Cholesterol 149  0 - 200 mg/dL   Triglycerides 616 (*) <150 mg/dL   HDL 43  >28 mg/dL   Total CHOL/HDL Ratio 3.5     VLDL 44 (*) 0 - 40 mg/dL   LDL Cholesterol 62  0 - 99 mg/dL   Comment:            Total Cholesterol/HDL:CHD Risk     Coronary Heart Disease Risk Table                         Men   Women      1/2 Average Risk   3.4   3.3      Average Risk       5.0   4.4      2 X Average Risk   9.6   7.1      3 X Average Risk  23.4   11.0                Use the calculated Patient Ratio     above and the CHD Risk Table     to determine the patient's CHD Risk.                ATP III CLASSIFICATION (LDL):      <100     mg/dL   Optimal      179-588  mg/dL   Near or Above                        Optimal      130-159  mg/dL   Borderline      436-561  mg/dL   High      >711     mg/dL   Very High  GLUCOSE, CAPILLARY     Status: Abnormal   Collection Time    07/13/14  8:01 AM      Result Value Ref Range   Glucose-Capillary 203 (*) 70 - 99 mg/dL  GLUCOSE, CAPILLARY     Status: Abnormal   Collection Time    07/13/14 11:34 AM      Result Value Ref Range   Glucose-Capillary 233 (*) 70 - 99 mg/dL   Dg Chest Portable 1 View  07/12/2014   CLINICAL DATA:  Chest pain and tachycardia starting yesterday.  EXAM: PORTABLE CHEST - 1 VIEW  COMPARISON:  03/08/2014  FINDINGS: The heart size and mediastinal contours are within normal limits. Both lungs are clear. The visualized skeletal structures are unremarkable.  IMPRESSION: No active disease.   Electronically Signed   By: Burman Nieves M.D.   On: 07/12/2014 21:15    ROS: General:no colds or fevers, no weight changes Skin:no rashes or ulcers HEENT:no blurred vision, no congestion CV:see HPI PUL:see HPI GI:a little diarrhea, no constipation or melena, no  indigestion GU:no hematuria, no dysuria MS:no joint pain, no claudication Neuro:no syncope, + lightheadedness with tachycardia Endo:+ diabetes poorly controlled with hgbA1C at 9.4, no thyroid disease   Blood pressure 109/74, pulse 69, temperature 97.9 F (36.6 C), temperature source Oral, resp. rate 18, height 5\' 4"  (1.626 m), weight 215 lb 12.8 oz (97.886 kg), SpO2 99.00%. PE: General:Pleasant affect though somewhat flat,  NAD Skin:Warm and dry, brisk capillary refill HEENT:normocephalic, sclera clear, mucus membranes moist Neck:supple, no JVD, no bruits, no adenopathy no thyromegaly   Heart:S1S2 RRR without murmur, gallup, rub or click Lungs:clear without rales, rhonchi, or wheezes WUJ:WJXB, non tender, + BS, do not palpate liver spleen or masses Ext:no lower ext edema, 2+ pedal pulses, 2+ radial pulses Neuro:alert and oriented X 3, MAE, follows commands, + facial symmetry    Assessment/Plan Principal Problem:   Chest pain- negative MI, continues with episode of chest pain relief with NTG- echo with normal Ef plan lexiscan myoview tomorrow to eval for ischemia  Active Problems:   DEPRESSION, MAJOR, RECURRENT, SEVERE   HYPERTENSION- controlled   ASTHMA   REFLUX, ESOPHAGEAL   HYPOKALEMIA, HX OF and K+ 2.8 this admit, now replaced   Tachycardia- on admit up to 120 none now was ST   Diabetes mellitus- poorly controlled   Acute kidney injury- Cr at 1.20 up fro 1.0 with GFR 61   Hyperglycemia- on admit 234.      [place]  Nurse Practitioner Certified [place] Pager [phone] or after 5pm or weekends call [phone] 07/13/2014, 2:23 PM     53 year old woman with long-standing hypertension and hypokalemia question hyper aldosteronism 50 with tachycardia palpitations and shortness of breath in the week of abrupt discontinuation of her clonidine. It is also accompanied by chest discomfort with typical and atypical features. She has significant limitations  in exercise tolerance related to a arthritis; hence, shortness of breath and chest pain are not typically limiting.  Her palpitations were irregular and rapid suggestive of atrial fibrillation. With her thromboembolic risk profile trying to elucidate this would be useful and probably the best [employer]ive[employer] monitor given the frequency. On the other hand, this may be related to discontinuation of her clonidine. She's had this once before this she stop her clonidine before that episode as well.  Chest pain syndrome this somewhat atypical agree with prior physicians suggesting that the Myoview risk stratification is reasonable.  Based on the above will therefore 1) undertake Myoview scanning 2) need outpatient sleep study 3) Renin-Aldo ratio 4) diabetes consultation 5) have reviewed the importance of avoiding discontinuation of her clonidine abruptly 6) replete potassium

## 2014-07-13 NOTE — Progress Notes (Signed)
[facility] PROGRESS NOTE  Heather Walker TDV:[id number] DOB: 07-13-1961 DOA: 07/12/2014 PCP: Garnet Koyanagi, DO  Assessment/Plan: #1 chest pain/unstable angina Patient presenting with 2 week history of intermittent chest pain lasting 15-20 minutes occurring both at rest and with exertion. Patient describes chest pain occasionally as a pressure sensation which is nonradiating and sometimes sharp. Associated palpitations or shortness of breath. On presentation to the ED chest pain was relieved on route with aspirin nitroglycerin via EMS. Patient currently chest pain-free. EKG showed a sinus tachycardia. Chest x-ray negative. Troponins negative x3. D-dimer is less than 0.27. Will check a fasting lipid panel. Check a TSH. Check a 2-D echo. Patient with elevated blood glucose level however denies any history of diabetes. Hemoglobin A1c is pending. Continue aspirin, Pepcid. Consult with cardiology for further evaluation and management possible inpatient versus outpatient stress test.  #2 hyperglycemia/probable new onset Diabetes mellitus Patient denies any prior history of diabetes. Patient obese with CBGs ranging from 203-359.Marland Kitchen Hemoglobin A1c is pending. Continue sliding scale insulin.  #3 acute renal insufficiency Likely secondary to diuretics. Improved with hydration. Saline lock IV fluids. Follow.  #4 hypokalemia Likely secondary to diuretics. Check a magnesium level. Repleted.  #5 obesity  #6 hypertension Continue Maxzide. Continue clonidine. Monitor for electrolytes.  #7 gastroesophageal reflux disease Pepcid.  #8 sinus tachycardia Likely secondary to problem #1. Improved. Cardiac enzymes negative x3. Check a TSH. Check a magnesium level. Follow.  #9 asthma Stable. Albuterol as needed.  #10 prophylaxis Pepcid for GI prophylaxis. Heparin for DVT prophylaxis.  Code Status: Full Family Communication: Updated patient no family at bedside. Disposition Plan: Home when medically  stable.   Consultants:  Cardiology pending  Procedures:  Chest x-ray 07/12/2014  Antibiotics:  None  HPI/Subjective: Patient states has been present on and off for the past 2 weeks occurring with exertion and at rest lasting about 15 minutes. Patient states that sometimes it feels like a pressure or heaviness on the chest and other times it is sharp. Patient denies any burning sensation. Patient currently chest pain-free. Patient denies any shortness of breath.  Objective: Filed Vitals:   07/13/14 0400  BP: 110/62  Pulse: 78  Temp: 98.4 F (36.9 C)  Resp: 18    Intake/Output Summary (Last 24 hours) at 07/13/14 0903 Last data filed at 07/12/14 2359  Gross per 24 hour  Intake      0 ml  Output    450 ml  Net   -450 ml   Filed Weights   07/12/14 2348  Weight: 97.886 kg (215 lb 12.8 oz)    Exam:   General:  NAD  Cardiovascular: RRR  Respiratory: CTAB  Abdomen: Soft, nontender, nondistended, positive bowel sounds  Musculoskeletal: No clubbing cyanosis or edema  Data Reviewed: Basic Metabolic Panel:  Recent Labs Lab 07/11/14 1402 07/12/14 2013 07/12/14 2047 07/12/14 2316 07/13/14 0523  NA 134* 139 140  --  138  K 2.8* 3.2* 3.1*  --  3.7  CL 95* 94* 98  --  97  CO2 30 28  --   --  27  GLUCOSE 234* 350* 359*  --  218*  BUN 11 13 12   --  14  CREATININE 1.0 1.18* 1.20* 1.18* 0.96  CALCIUM 9.5 9.6  --   --  8.5   Liver Function Tests:  Recent Labs Lab 07/11/14 1402  AST 26  ALT 24  ALKPHOS 97  BILITOT 0.4  PROT 8.8*  ALBUMIN 4.2   No results found for  this basename: LIPASE, AMYLASE,  in the last 168 hours No results found for this basename: AMMONIA,  in the last 168 hours CBC:  Recent Labs Lab 07/12/14 2013 07/12/14 2047 07/12/14 2316 07/13/14 0523  WBC 5.8  --  6.0 4.8  HGB 13.8 15.6* 13.1 12.2  HCT 39.8 46.0 38.2 35.9*  MCV 83.1  --  83.0 83.3  PLT 294  --  282 240   Cardiac Enzymes:  Recent Labs Lab 07/12/14 2314  07/13/14 0146 07/13/14 0523  TROPONINI <0.30 <0.30 <0.30   BNP (last 3 results) No results found for this basename: PROBNP,  in the last 8760 hours CBG:  Recent Labs Lab 07/12/14 2352 07/13/14 0801  GLUCAP 256* 203*    No results found for this or any previous visit (from the past 240 hour(s)).   Studies: Dg Chest Portable 1 View  07/12/2014   CLINICAL DATA:  Chest pain and tachycardia starting yesterday.  EXAM: PORTABLE CHEST - 1 VIEW  COMPARISON:  03/08/2014  FINDINGS: The heart size and mediastinal contours are within normal limits. Both lungs are clear. The visualized skeletal structures are unremarkable.  IMPRESSION: No active disease.   Electronically Signed   By: Lucienne Capers M.D.   On: 07/12/2014 21:15    Scheduled Meds: . aspirin EC  325 mg Oral Daily  . cloNIDine  0.2 mg Oral BID  . famotidine  20 mg Oral BID  . heparin  5,000 Units Subcutaneous 3 times per day  . insulin aspart  0-9 Units Subcutaneous TID WC  . triamterene-hydrochlorothiazide  1 tablet Oral Daily   Continuous Infusions: . sodium chloride 100 mL/hr at 07/13/14 0047    Principal Problem:   Chest pain Active Problems:   DEPRESSION, MAJOR, RECURRENT, SEVERE   HYPERTENSION   ASTHMA   REFLUX, ESOPHAGEAL   HYPOKALEMIA, HX OF   Tachycardia   Diabetes mellitus   Acute kidney injury   Hyperglycemia    Time spent: 45 minutes    [facility] MD [facility] Pager [phone]. If 7PM-7AM, please contact night-coverage at [email or web address], password [facility] 07/13/2014, 9:03 AM  LOS: 1 day

## 2014-07-13 NOTE — Progress Notes (Signed)
Pt c/o CP (8/10).  Gave one SL Ntg, CP down to 7/10. Gave 2nd SL Ntg, CP completely gone.  BP remained stable.  EKG obtained, which showed some changes from previous EKG.  Dr. Grandville Silos made aware.  Pt NPO for Cardiology to assess this am.

## 2014-07-13 NOTE — Progress Notes (Signed)
Utilization Review Completed.   Tyshana Nishida, RN, BSN Nurse Case Manager  

## 2014-07-14 ENCOUNTER — Other Ambulatory Visit: Payer: Self-pay | Admitting: Physician Assistant

## 2014-07-14 ENCOUNTER — Observation Stay (HOSPITAL_COMMUNITY): Payer: Medicare Other

## 2014-07-14 ENCOUNTER — Telehealth: Payer: Self-pay | Admitting: Cardiology

## 2014-07-14 DIAGNOSIS — R079 Chest pain, unspecified: Secondary | ICD-10-CM

## 2014-07-14 DIAGNOSIS — E119 Type 2 diabetes mellitus without complications: Secondary | ICD-10-CM | POA: Diagnosis not present

## 2014-07-14 DIAGNOSIS — I10 Essential (primary) hypertension: Secondary | ICD-10-CM | POA: Diagnosis not present

## 2014-07-14 DIAGNOSIS — R000 Tachycardia, unspecified: Secondary | ICD-10-CM | POA: Diagnosis not present

## 2014-07-14 DIAGNOSIS — R002 Palpitations: Secondary | ICD-10-CM

## 2014-07-14 DIAGNOSIS — R072 Precordial pain: Secondary | ICD-10-CM | POA: Diagnosis not present

## 2014-07-14 LAB — BASIC METABOLIC PANEL
Anion gap: 11 (ref 5–15)
BUN: 12 mg/dL (ref 6–23)
CO2: 26 mEq/L (ref 19–32)
Calcium: 8.5 mg/dL (ref 8.4–10.5)
Chloride: 100 mEq/L (ref 96–112)
Creatinine, Ser: 0.89 mg/dL (ref 0.50–1.10)
GFR calc Af Amer: 86 mL/min — ABNORMAL LOW (ref >90)
GFR calc non Af Amer: 74 mL/min — ABNORMAL LOW (ref >90)
Glucose, Bld: 201 mg/dL — ABNORMAL HIGH (ref 70–99)
Potassium: 4.3 mEq/L (ref 3.7–5.3)
Sodium: 137 mEq/L (ref 137–147)

## 2014-07-14 LAB — GLUCOSE, CAPILLARY
Glucose-Capillary: 210 mg/dL — ABNORMAL HIGH (ref 70–99)
Glucose-Capillary: 209 mg/dL — ABNORMAL HIGH (ref 70–99)
Glucose-Capillary: 251 mg/dL — ABNORMAL HIGH (ref 70–99)
Glucose-Capillary: 189 mg/dL — ABNORMAL HIGH (ref 70–99)
Glucose-Capillary: 253 mg/dL — ABNORMAL HIGH (ref 70–99)

## 2014-07-14 LAB — CBC
HCT: 33.4 % — ABNORMAL LOW (ref 36.0–46.0)
Hemoglobin: 11.2 g/dL — ABNORMAL LOW (ref 12.0–15.0)
MCH: 28.1 pg (ref 26.0–34.0)
MCHC: 33.5 g/dL (ref 30.0–36.0)
MCV: 83.7 fL (ref 78.0–100.0)
Platelets: 214 10*3/uL (ref 150–400)
RBC: 3.99 MIL/uL (ref 3.87–5.11)
RDW: 13.9 % (ref 11.5–15.5)
WBC: 4 10*3/uL (ref 4.0–10.5)

## 2014-07-14 LAB — MAGNESIUM: Magnesium: 2.2 mg/dL (ref 1.5–2.5)

## 2014-07-14 MED ORDER — TECHNETIUM TC 99M SESTAMIBI GENERIC - CARDIOLITE
30.0000 | Freq: Once | INTRAVENOUS | Status: AC | PRN
  Administered 2014-07-14: 30 via INTRAVENOUS

## 2014-07-14 MED ORDER — LIVING WELL WITH DIABETES BOOK
Freq: Once | Status: DC
  Filled 2014-07-14: qty 1

## 2014-07-14 MED ORDER — REGADENOSON 0.4 MG/5ML IV SOLN
0.4000 mg | Freq: Once | INTRAVENOUS | Status: AC
  Administered 2014-07-14: 0.4 mg via INTRAVENOUS
  Filled 2014-07-14: qty 5

## 2014-07-14 MED ORDER — PNEUMOCOCCAL VAC POLYVALENT 25 MCG/0.5ML IJ INJ
0.5000 mL | INJECTION | INTRAMUSCULAR | Status: AC
  Administered 2014-07-15: 0.5 mL via INTRAMUSCULAR
  Filled 2014-07-14: qty 0.5

## 2014-07-14 MED ORDER — INSULIN ASPART 100 UNIT/ML ~~LOC~~ SOLN
0.0000 [IU] | Freq: Three times a day (TID) | SUBCUTANEOUS | Status: DC
  Administered 2014-07-14: 2 [IU] via SUBCUTANEOUS
  Administered 2014-07-14: 5 [IU] via SUBCUTANEOUS

## 2014-07-14 MED ORDER — CLONIDINE HCL 0.1 MG PO TABS
0.1000 mg | ORAL_TABLET | Freq: Two times a day (BID) | ORAL | Status: DC
  Administered 2014-07-14 – 2014-07-15 (×3): 0.1 mg via ORAL
  Filled 2014-07-14 (×4): qty 1

## 2014-07-14 MED ORDER — REGADENOSON 0.4 MG/5ML IV SOLN
INTRAVENOUS | Status: AC
  Administered 2014-07-14: 0.4 mg via INTRAVENOUS
  Filled 2014-07-14: qty 5

## 2014-07-14 MED ORDER — INSULIN GLARGINE 100 UNIT/ML ~~LOC~~ SOLN
10.0000 [IU] | Freq: Every day | SUBCUTANEOUS | Status: DC
  Administered 2014-07-14: 10 [IU] via SUBCUTANEOUS
  Filled 2014-07-14 (×2): qty 0.1

## 2014-07-14 MED ORDER — TECHNETIUM TC 99M SESTAMIBI GENERIC - CARDIOLITE
10.0000 | Freq: Once | INTRAVENOUS | Status: AC | PRN
  Administered 2014-07-14: 10 via INTRAVENOUS

## 2014-07-14 MED ORDER — INSULIN ASPART 100 UNIT/ML ~~LOC~~ SOLN
0.0000 [IU] | Freq: Three times a day (TID) | SUBCUTANEOUS | Status: DC
  Administered 2014-07-15: 4 [IU] via SUBCUTANEOUS
  Administered 2014-07-15: 7 [IU] via SUBCUTANEOUS

## 2014-07-14 NOTE — Progress Notes (Signed)
Patient Name: Heather Walker Date of Encounter: 07/14/2014     Principal Problem:   Chest pain Active Problems:   DEPRESSION, MAJOR, RECURRENT, SEVERE   HYPERTENSION   ASTHMA   REFLUX, ESOPHAGEAL   HYPOKALEMIA, HX OF   Tachycardia   Diabetes mellitus   Acute kidney injury   Hyperglycemia    SUBJECTIVE  Denies any current CP or SOB. Has not had any recent asthma exacerbation. States her asthma attacks are related to seasonal allergy  CURRENT MEDS . aspirin EC  81 mg Oral Daily  . cloNIDine  0.1 mg Oral BID  . famotidine  20 mg Oral BID  . heparin  5,000 Units Subcutaneous 3 times per day  . insulin aspart  0-9 Units Subcutaneous Q4H  . triamterene-hydrochlorothiazide  1 tablet Oral Daily    OBJECTIVE  Filed Vitals:   07/13/14 2000 07/13/14 2154 07/14/14 0400 07/14/14 0925  BP: 107/71 122/71 108/74 103/81  Pulse: 79  64 63  Temp: 98 F (36.7 C)  98.1 F (36.7 C)   TempSrc:      Resp: 16  16   Height:      Weight:   213 lb 12.8 oz (96.979 kg)   SpO2: 97%  98%     Intake/Output Summary (Last 24 hours) at 07/14/14 0929 Last data filed at 07/14/14 0600  Gross per 24 hour  Intake 2313.33 ml  Output   2150 ml  Net 163.33 ml   Filed Weights   07/12/14 2348 07/14/14 0400  Weight: 215 lb 12.8 oz (97.886 kg) 213 lb 12.8 oz (96.979 kg)    PHYSICAL EXAM  General: Pleasant, NAD. Neuro: Alert and oriented X 3. Moves all extremities spontaneously. Psych: Normal affect. HEENT:  Normal  Neck: Supple without bruits or JVD. Lungs:  Resp regular and unlabored, CTA. Heart: RRR no s3, s4, or murmurs. Abdomen: Soft, non-tender, non-distended, BS + x 4.  Extremities: No clubbing, cyanosis or edema. DP/PT/Radials 2+ and equal bilaterally.  Accessory Clinical Findings  CBC  Recent Labs  07/13/14 0523 07/14/14 0428  WBC 4.8 4.0  HGB 12.2 11.2*  HCT 35.9* 33.4*  MCV 83.3 83.7  PLT 240 937   Basic Metabolic Panel  Recent Labs  07/13/14 0523 07/14/14 0428   NA 138 137  K 3.7 4.3  CL 97 100  CO2 27 26  GLUCOSE 218* 201*  BUN 14 12  CREATININE 0.96 0.89  CALCIUM 8.5 8.5  MG 1.7 2.2   Liver Function Tests  Recent Labs  07/11/14 1402  AST 26  ALT 24  ALKPHOS 97  BILITOT 0.4  PROT 8.8*  ALBUMIN 4.2   Cardiac Enzymes  Recent Labs  07/12/14 2314 07/13/14 0146 07/13/14 0523  TROPONINI <0.30 <0.30 <0.30   D-Dimer  Recent Labs  07/12/14 2015  DDIMER <0.27   Hemoglobin A1C  Recent Labs  07/13/14 0523  HGBA1C 9.4*   Fasting Lipid Panel  Recent Labs  07/13/14 0523  CHOL 149  HDL 43  LDLCALC 62  TRIG 218*  CHOLHDL 3.5   Thyroid Function Tests  Recent Labs  07/13/14 1345  TSH 1.360    TELE NSR with HR 70s, no significant ventricular ectopy overnight    ECG  NSR with HR 70s, TWI in inferior lead  Echocardiogram 07/13/2014  LV EF: 60% - 65%  ------------------------------------------------------------------- Indications: Chest pain 786.51.  ------------------------------------------------------------------- History: Risk factors: Asthma. Hypertension.  ------------------------------------------------------------------- Study Conclusions  - Left ventricle: The cavity size was normal.  Systolic function was normal. The estimated ejection fraction was in the range of 60% to 65%. Wall motion was normal; there were no regional wall motion abnormalities.      Radiology/Studies  Dg Chest Portable 1 View  07/12/2014   CLINICAL DATA:  Chest pain and tachycardia starting yesterday.  EXAM: PORTABLE CHEST - 1 VIEW  COMPARISON:  03/08/2014  FINDINGS: The heart size and mediastinal contours are within normal limits. Both lungs are clear. The visualized skeletal structures are unremarkable.  IMPRESSION: No active disease.   Electronically Signed   By: Lucienne Capers M.D.   On: 07/12/2014 21:15    ASSESSMENT AND PLAN  1. Chest pain with typical and atypical features  - happened in conjunction of  running out of clonidine  - pending Lexiscan stress test  2. HTN 3. Asthma 4. GERD 5. DM 6. AKI 7. Depression 8. Hypokalemia    Signed, Almyra Deforest PA-C Pager: [phone]

## 2014-07-14 NOTE — Telephone Encounter (Signed)
Pt just started back on all meds  Check K next week  Recheck all in 3 monthse 272.4 250.00 lipid, hep, bmp, hgba1c

## 2014-07-14 NOTE — Telephone Encounter (Signed)
Please set up outpt sleep study

## 2014-07-14 NOTE — Telephone Encounter (Signed)
spoke with patient and she has been admitted to the hospital.      KP

## 2014-07-14 NOTE — Progress Notes (Signed)
Patient seen and examined and agree with note as outlined by Almyra Deforest, PA-C.  Atypical and typical chest pain for nuclear stress test today.

## 2014-07-14 NOTE — Progress Notes (Signed)
Inpatient Diabetes Program Recommendations  AACE/ADA: New Consensus Statement on Inpatient Glycemic Control (2013)  Target Ranges:  Prepandial:   less than 140 mg/dL      Peak postprandial:   less than 180 mg/dL (1-2 hours)      Critically ill patients:  140 - 180 mg/dL   Reason for Visit: Consult - Newly-diagnosed DM  Diabetes history: None Outpatient Diabetes medications: N/A Current orders for Inpatient glycemic control: Novolog sensitive tidwc  53 year old female admitted with CP. Hyperglycemia on admission. No hx DM. HgbA1C 9.4%. Diagnosed with Type 2 DM. Blood sugars continue in 200s. Needs basal insulin.   Inpatient Diabetes Program Recommendations Insulin - Basal: Add Lantus 18 units QHS Correction (SSI): Increase Novolog to moderate tidwc and hs Insulin - Meal Coverage: Add Novolog 3 units tidwc for meal coverage insulin HgbA1C: 9.4% - newly-diagnosed Outpatient Referral: OP Diabetes Education consult for newly-diagnosed DM  Will order Living Well With Diabetes book, encourage pt to view diabetes videos on pt ed channel. ? If pt will go home on insulin. If so, will order insulin starter kit and RN begin teaching insulin administration. To speak with pt regarding new diagnosis of DM, how diet, exercise and stress affect blood sugars, HgbA1C results and meaning. Will need to f/u with PCP.  Will follow daily. Thank you. Lorenda Peck, RD, LDN, CDE Inpatient Diabetes Coordinator [phone]

## 2014-07-14 NOTE — Progress Notes (Signed)
UR completed 

## 2014-07-14 NOTE — Progress Notes (Signed)
[facility] PROGRESS NOTE  VIANNA VENEZIA YQM:[id number] DOB: [birth year] DOA: 07/12/2014 PCP: Garnet Koyanagi, DO  Assessment/Plan: #1 chest pain Questionable etiology. Patient with multiple cardiac risk factors. 2-D echo with a normal EF with no wall motion abnormalities. Cardiac enzymes negative x3. Fasting lipid panel with LDL of 62. Patient does have new-onset diabetes with a hemoglobin A1c of 9.4. Patient status post stress Myoview with results pending. Continue aspirin, clonidine. Cardiology following and appreciate input and recommendations.  #2 hypertension Patient did have some borderline blood pressure yesterday. Clonidine has been decreased to 0.1 mg twice daily. Outpatient followup.  #3 new-onset diabetes mellitus Hemoglobin A1c is 9.4. CBGs have ranged from 189-251. Continue sliding scale insulin. Patient has been seen by diabetic coordinator. Outpatient diabetes education and followup.  #4 acute renal insufficiency Likely secondary to diuretics. Improved with hydration. Follow.  #5 hypokalemia Secondary to diuretics. Repleted.  #6 obesity  #7 gastroesophageal reflux disease Pepcid.  #8 sinus tachycardia  likely secondary to problem #1. Cardiac enzymes negative x3. TSH within normal limits. Follow.  #9 asthma Stable. Albuterol as needed.  #10 prophylaxis Pepcid for GI prophylaxis. Heparin for DVT prophylaxis.  Code Status: Full Family Communication: Updated patient no family at bedside. Disposition Plan: Home hopefully one to 2 days.   Consultants:  Cardiology: Dr. Caryl Comes 07/13/2014  Procedures:  Myoview stress test 07/14/2014  2-D echo 07/13/2014  Chest x-ray 07/12/2014  Antibiotics:  None  HPI/Subjective: Patient states some improvement with chest pain. Patient states had some shortness of breath is getting winded today.   Objective: Filed Vitals:   07/14/14 1427  BP: 120/76  Pulse: 70  Temp: 98.2 F (36.8 C)  Resp: 16     Intake/Output Summary (Last 24 hours) at 07/14/14 1650 Last data filed at 07/14/14 1100  Gross per 24 hour  Intake 2313.33 ml  Output   2050 ml  Net 263.33 ml   Filed Weights   07/12/14 2348 07/14/14 0400  Weight: 97.886 kg (215 lb 12.8 oz) 96.979 kg (213 lb 12.8 oz)    Exam:   General:  nad  Cardiovascular: rrr  Respiratory: ctab anterior lung fields  Abdomen: Soft, nontender, nondistended, positive bowel sounds.  Musculoskeletal: No clubbing cyanosis or edema  Data Reviewed: Basic Metabolic Panel:  Recent Labs Lab 07/11/14 1402 07/12/14 2013 07/12/14 2047 07/12/14 2316 07/13/14 0523 07/14/14 0428  NA 134* 139 140  --  138 137  K 2.8* 3.2* 3.1*  --  3.7 4.3  CL 95* 94* 98  --  97 100  CO2 30 28  --   --  27 26  GLUCOSE 234* 350* 359*  --  218* 201*  BUN 11 13 12   --  14 12  CREATININE 1.0 1.18* 1.20* 1.18* 0.96 0.89  CALCIUM 9.5 9.6  --   --  8.5 8.5  MG  --   --   --   --  1.7 2.2   Liver Function Tests:  Recent Labs Lab 07/11/14 1402  AST 26  ALT 24  ALKPHOS 97  BILITOT 0.4  PROT 8.8*  ALBUMIN 4.2   No results found for this basename: LIPASE, AMYLASE,  in the last 168 hours No results found for this basename: AMMONIA,  in the last 168 hours CBC:  Recent Labs Lab 07/12/14 2013 07/12/14 2047 07/12/14 2316 07/13/14 0523 07/14/14 0428  WBC 5.8  --  6.0 4.8 4.0  HGB 13.8 15.6* 13.1 12.2 11.2*  HCT 39.8 46.0 38.2 35.9* 33.4*  MCV 83.1  --  83.0 83.3 83.7  PLT 294  --  282 240 214   Cardiac Enzymes:  Recent Labs Lab 07/12/14 2314 07/13/14 0146 07/13/14 0523  TROPONINI <0.30 <0.30 <0.30   BNP (last 3 results) No results found for this basename: PROBNP,  in the last 8760 hours CBG:  Recent Labs Lab 07/13/14 2356 07/14/14 0415 07/14/14 0733 07/14/14 1126 07/14/14 1642  GLUCAP 210* 210* 209* 251* 189*    No results found for this or any previous visit (from the past 240 hour(s)).   Studies: Nm Myocar Multi W/spect  W/wall Motion / Ef  07/14/2014   CLINICAL DATA:  Patient is a 53 yo with history of CP Test to evaluate, rule out ischemia.  EXAM: MYOCARDIAL IMAGING WITH SPECT (REST AND PHARMACOLOGIC-STRESS)  GATED LEFT VENTRICULAR WALL MOTION STUDY  LEFT VENTRICULAR EJECTION FRACTION  TECHNIQUE: Standard myocardial SPECT imaging was performed after resting intravenous injection of 10 mCi Tc-66m sestamibi. Subsequently, intravenous infusion of Lexiscan was performed under the supervision of the Cardiology staff. At peak effect of the drug, 30 mCi Tc-40m sestamibi was injected intravenously and standard myocardial SPECT imaging was performed. Quantitative gated imaging was also performed to evaluate left ventricular wall motion, and estimate left ventricular ejection fraction.  COMPARISON:  None.  FINDINGS: Stress data. Patient underwent testing with Lexiscan. Baseline EKG SR 64 bpm Baseline BP 103/81. With infusion of Lexiscan there were no EKG changes to suggest ischemia  Nuclear data: In the initial stress images there was mild thinning in the distal inferolateral wall. Otherwise normal perfusion. In the recovery images there was significant changes to suggest ischemia. On review of the raw data, there was extensive soft tissue (breast, bowel activity) surrounding the heart.  ON gating, LVEF was calculated at 71% with normal wall motion  IMPRESSION: Lexiscan Myoview: Electrically negative for ischemia. Myoview scan with probable normal perfusion and mild soft tissue attenuation (breast). No evid for significant ischemia or scar. LVEF on gating calculated at 71%. *2012 Appropriate Use Criteria for Coronary Revascularization Focused Update: J Am Coll Cardiol. 5701;77(9):390-300. http://content.airportbarriers.com.aspx?articleid=1201161   Electronically Signed   By: Dorris Carnes M.D.   On: 07/14/2014 16:05   Dg Chest Portable 1 View  07/12/2014   CLINICAL DATA:  Chest pain and tachycardia starting yesterday.  EXAM: PORTABLE  CHEST - 1 VIEW  COMPARISON:  03/08/2014  FINDINGS: The heart size and mediastinal contours are within normal limits. Both lungs are clear. The visualized skeletal structures are unremarkable.  IMPRESSION: No active disease.   Electronically Signed   By: Lucienne Capers M.D.   On: 07/12/2014 21:15    Scheduled Meds: . aspirin EC  81 mg Oral Daily  . cloNIDine  0.1 mg Oral BID  . famotidine  20 mg Oral BID  . heparin  5,000 Units Subcutaneous 3 times per day  . insulin aspart  0-9 Units Subcutaneous TID AC & HS  . living well with diabetes book   Does not apply Once  . triamterene-hydrochlorothiazide  1 tablet Oral Daily   Continuous Infusions: . sodium chloride 100 mL/hr at 07/13/14 1157    Principal Problem:   Chest pain Active Problems:   DEPRESSION, MAJOR, RECURRENT, SEVERE   HYPERTENSION   ASTHMA   REFLUX, ESOPHAGEAL   HYPOKALEMIA, HX OF   Tachycardia   Diabetes mellitus   Acute kidney injury   Hyperglycemia    Time spent: [place] MD [facility] Pager [phone]. If 7PM-7AM,  please contact night-coverage at [email or web address], password [facility] 07/14/2014, 4:50 PM  LOS: 2 days

## 2014-07-14 NOTE — Telephone Encounter (Signed)
Msg left to call the office     KP 

## 2014-07-14 NOTE — Progress Notes (Signed)
Nuclear stress test with no ischemia and normal LVF.  CP most likely due to abrupt cessation of Clonidine.   No further ischemic cardiac workup.  Will set up outpt 30 day event monitor.  Will have patient followup on heart monitor with Dr. Caryl Comes as outpt.  Will set up outpt sleep study per Dr. Aquilla Hacker recs.   - will sign off.

## 2014-07-14 NOTE — Progress Notes (Signed)
Lexiscan stress test completed without complication. Pending result by [facility] imager.  Hilbert Corrigan PA Pager: [phone]

## 2014-07-15 DIAGNOSIS — R079 Chest pain, unspecified: Secondary | ICD-10-CM | POA: Diagnosis not present

## 2014-07-15 DIAGNOSIS — I10 Essential (primary) hypertension: Secondary | ICD-10-CM | POA: Diagnosis not present

## 2014-07-15 DIAGNOSIS — N179 Acute kidney failure, unspecified: Secondary | ICD-10-CM | POA: Diagnosis not present

## 2014-07-15 DIAGNOSIS — E119 Type 2 diabetes mellitus without complications: Secondary | ICD-10-CM | POA: Diagnosis not present

## 2014-07-15 DIAGNOSIS — R072 Precordial pain: Secondary | ICD-10-CM | POA: Diagnosis not present

## 2014-07-15 LAB — BASIC METABOLIC PANEL
Anion gap: 13 (ref 5–15)
BUN: 13 mg/dL (ref 6–23)
CO2: 28 mEq/L (ref 19–32)
Calcium: 8.6 mg/dL (ref 8.4–10.5)
Chloride: 97 mEq/L (ref 96–112)
Creatinine, Ser: 0.89 mg/dL (ref 0.50–1.10)
GFR calc Af Amer: 86 mL/min — ABNORMAL LOW (ref >90)
GFR calc non Af Amer: 74 mL/min — ABNORMAL LOW (ref >90)
Glucose, Bld: 213 mg/dL — ABNORMAL HIGH (ref 70–99)
Potassium: 3.6 mEq/L — ABNORMAL LOW (ref 3.7–5.3)
Sodium: 138 mEq/L (ref 137–147)

## 2014-07-15 LAB — GLUCOSE, CAPILLARY
Glucose-Capillary: 190 mg/dL — ABNORMAL HIGH (ref 70–99)
Glucose-Capillary: 206 mg/dL — ABNORMAL HIGH (ref 70–99)

## 2014-07-15 MED ORDER — METFORMIN HCL 500 MG PO TABS: 500.0000 mg | ORAL_TABLET | Freq: Two times a day (BID) | ORAL | Status: DC

## 2014-07-15 MED ORDER — ASPIRIN 81 MG PO TBEC: 81.0000 mg | DELAYED_RELEASE_TABLET | Freq: Every day | ORAL | Status: DC

## 2014-07-15 MED ORDER — CLONIDINE HCL 0.1 MG PO TABS: 0.1000 mg | ORAL_TABLET | Freq: Two times a day (BID) | ORAL | Status: DC

## 2014-07-15 MED ORDER — POTASSIUM CHLORIDE CRYS ER 20 MEQ PO TBCR
40.0000 meq | EXTENDED_RELEASE_TABLET | Freq: Once | ORAL | Status: AC
  Administered 2014-07-15: 40 meq via ORAL
  Filled 2014-07-15: qty 2

## 2014-07-15 MED ORDER — POTASSIUM CHLORIDE ER 20 MEQ PO TBCR: 20.0000 meq | EXTENDED_RELEASE_TABLET | Freq: Every day | ORAL | Status: DC

## 2014-07-15 NOTE — Discharge Instructions (Signed)
[facility] will call you for appointment times. If you do not hear anything within a few days call [phone]

## 2014-07-15 NOTE — Telephone Encounter (Signed)
What Dx Code would you like me to use? Also is pt aware that sleep study will be ordered?

## 2014-07-15 NOTE — Discharge Summary (Signed)
Physician Discharge Summary  Heather Walker VQX:[id number] DOB: [birth year] DOA: 07/12/2014  PCP: Garnet Koyanagi, DO  Admit date: 07/12/2014 Discharge date: 07/15/2014  Time spent: 70 minutes  Recommendations for Outpatient Follow-up:  1. Followup with Garnet Koyanagi, DO in 1 week. On followup the patient will need a basic metabolic profile done to followup on electrolytes and renal function. Patient has been diagnosed as new onset diabetes with a hemoglobin A1c of 9.4. Patient has been started on metformin. Outpatient diabetes and need to be followed up upon per PCP. 2. Patient will be called for outpatient followup with Dr. Caryl Comes of cardiology. Patient will be set up for 30 day event monitor. Patient will also be set up for outpatient sleep study  Discharge Diagnoses:  Principal Problem:   Chest pain Active Problems:   DEPRESSION, MAJOR, RECURRENT, SEVERE   HYPERTENSION   ASTHMA   REFLUX, ESOPHAGEAL   HYPOKALEMIA, HX OF   Tachycardia   Diabetes mellitus   Acute kidney injury   Hyperglycemia   Discharge Condition: Stable and improved  Diet recommendation: Carb modified  Filed Weights   07/12/14 2348 07/14/14 0400  Weight: 97.886 kg (215 lb 12.8 oz) 96.979 kg (213 lb 12.8 oz)    History of present illness:  Heather Walker is a 53 y.o. female came to [facility] ed 07/12/2014 with  2 wks of CP. Episodes would come on at rest or w/ exertion. Associated w/ nausea, SOB, fatigue, diaphoresis. Lasted 15-20 min typically. Improved w/ laying down. Out of clonidine for a couple of days. Denied cough, syncope, dysuria, frequency. Called EMS on the day of admission, when episode not relieved w/ rest. EMS gave ASA and nitro w/ relief. Episodes coming on w/ greater frequency. Occuring daily.      Hospital Course:  #1 chest pain  Questionable etiology. Likely secondary to abrupt cessation of clonidine. Patient with multiple cardiac risk factors and a such was admitted under telemetry and cardiac workup  undertaken. 2-D echo with a normal EF with no wall motion abnormalities. Cardiac enzymes negative x3. Fasting lipid panel with LDL of 62. Patient does have new-onset diabetes with a hemoglobin A1c of 9.4. Patient subsequently underwent a  stress Myoview with no ischemia and normal left ventricular function. It was felt per cardiology that no further cardiac workup was needed at this time. Patient will be set up for a 30 day event monitor and will followup with Dr. Caryl Comes of cardiology as outpatient. Patient was placed on a baby aspirin as well. Outpatient followup. #2 hypertension  Patient did have some borderline blood pressure during the hospitalization after being resumed on her home regimen of clonidine. Clonidine dose was decreased to 0.1 mg twice daily which patient tolerated. Outpatient followup.  #3 new-onset diabetes mellitus  Hemoglobin A1c is 9.4. Patient was placed on Lantus and SSI throughout the hospitalization. Patient will be set up for outpatient diabetes education. Patient will be discharged on metformin with outpatient follow up. #4 acute renal insufficiency  Likely secondary to diuretics. Improved with hydration. Follow.  #5 hypokalemia  Secondary to diuretics. Repleted. Will d/c on daily potassium supplementation. #6 obesity  #7 gastroesophageal reflux disease  Pepcid.  #8 sinus tachycardia  likely secondary to problem #1. Cardiac enzymes negative x3. TSH within normal limits. Patient's clonidine was resumed. Follow.  #9 asthma  Stable throughout the hospitalilization. Albuterol as needed.      Procedures: Myoview stress test 07/14/2014  2-D echo 07/13/2014  Chest x-ray 07/12/2014   Consultations:  Cardiology: Dr. Caryl Comes 07/13/2014   Discharge Exam: Filed Vitals:   07/14/14 2100  BP: 127/86  Pulse: 75  Temp: 98.3 F (36.8 C)  Resp: 16    General: NAD Cardiovascular: RRR Respiratory: CTAB  Discharge Instructions You were cared for by a hospitalist during  your hospital stay. If you have any questions about your discharge medications or the care you received while you were in the hospital after you are discharged, you can call the unit and asked to speak with the hospitalist on call if the hospitalist that took care of you is not available. Once you are discharged, your primary care physician will handle any further medical issues. Please note that NO REFILLS for any discharge medications will be authorized once you are discharged, as it is imperative that you return to your primary care physician (or establish a relationship with a primary care physician if you do not have one) for your aftercare needs so that they can reassess your need for medications and monitor your lab values.  Discharge Instructions   Ambulatory referral to Nutrition and Diabetic Education    Complete by:  As directed      Diet Carb Modified    Complete by:  As directed      Discharge instructions    Complete by:  As directed   Follow up with Garnet Koyanagi, DO in 1 week. Patient will be called with follow up appointment with Dr Caryl Comes of cardiology,and also to be set up for event monitor.     Increase activity slowly    Complete by:  As directed           Current Discharge Medication List    START taking these medications   Details  aspirin EC 81 MG EC tablet Take 1 tablet (81 mg total) by mouth daily.    metFORMIN (GLUCOPHAGE) 500 MG tablet Take 1 tablet (500 mg total) by mouth 2 (two) times daily with a meal. Qty: 62 tablet, Refills: 0    potassium chloride 20 MEQ TBCR Take 20 mEq by mouth daily. Qty: 30 tablet, Refills: 0      CONTINUE these medications which have CHANGED   Details  cloNIDine (CATAPRES) 0.1 MG tablet Take 1 tablet (0.1 mg total) by mouth 2 (two) times daily. Qty: 62 tablet, Refills: 0      CONTINUE these medications which have NOT CHANGED   Details  albuterol (PROVENTIL HFA;VENTOLIN HFA) 108 (90 BASE) MCG/ACT inhaler Inhale 2 puffs into the  lungs every 6 (six) hours as needed. For shortness of breath. Qty: 1 Inhaler, Refills: 4   Associated Diagnoses: Unspecified asthma(493.90)    famotidine (PEPCID) 20 MG tablet Take 1 tablet (20 mg total) by mouth 2 (two) times daily. Qty: 30 tablet, Refills: 0    traMADol (ULTRAM) 50 MG tablet Take 50 mg by mouth every 6 (six) hours as needed for moderate pain.    triamterene-hydrochlorothiazide (MAXZIDE) 75-50 MG per tablet Take 1 tablet by mouth daily. Qty: 90 tablet, Refills: 1   Associated Diagnoses: Essential hypertension       Allergies  Allergen Reactions  . Codeine Itching   Follow-up Information   Follow up with [facility]. (Office will contact patient to set up follow up appt with Dr. Caryl Comes and 30 day event monitor. (message left with scheduler) Please call us if you do not hear from [place] in 2 days)    Specialty:  Cardiology   Contact information:  [address], [address] [place] [address] [phone]      Follow up with Garnet Koyanagi, DO. Schedule an appointment as soon as possible for a visit in 1 week.   Specialty:  Family Medicine   Contact information:   [place] [address] [phone]        The results of significant diagnostics from this hospitalization (including imaging, microbiology, ancillary and laboratory) are listed below for reference.    Significant Diagnostic Studies: Nm Myocar Multi W/spect W/wall Motion / Ef  07/14/2014   CLINICAL DATA:  Patient is a 53 yo with history of CP Test to evaluate, rule out ischemia.  EXAM: MYOCARDIAL IMAGING WITH SPECT (REST AND PHARMACOLOGIC-STRESS)  GATED LEFT VENTRICULAR WALL MOTION STUDY  LEFT VENTRICULAR EJECTION FRACTION  TECHNIQUE: Standard myocardial SPECT imaging was performed after resting intravenous injection of 10 mCi Tc-39m sestamibi. Subsequently, intravenous infusion of Lexiscan was performed under the supervision of the Cardiology staff.  At peak effect of the drug, 30 mCi Tc-32m sestamibi was injected intravenously and standard myocardial SPECT imaging was performed. Quantitative gated imaging was also performed to evaluate left ventricular wall motion, and estimate left ventricular ejection fraction.  COMPARISON:  None.  FINDINGS: Stress data. Patient underwent testing with Lexiscan. Baseline EKG SR 64 bpm Baseline BP 103/81. With infusion of Lexiscan there were no EKG changes to suggest ischemia  Nuclear data: In the initial stress images there was mild thinning in the distal inferolateral wall. Otherwise normal perfusion. In the recovery images there was significant changes to suggest ischemia. On review of the raw data, there was extensive soft tissue (breast, bowel activity) surrounding the heart.  ON gating, LVEF was calculated at 71% with normal wall motion  IMPRESSION: Lexiscan Myoview: Electrically negative for ischemia. Myoview scan with probable normal perfusion and mild soft tissue attenuation (breast). No evid for significant ischemia or scar. LVEF on gating calculated at 71%. *2012 Appropriate Use Criteria for Coronary Revascularization Focused Update: J Am Coll Cardiol. 1740;81(4):481-856. http://content.airportbarriers.com.aspx?articleid=1201161   Electronically Signed   By: Dorris Carnes M.D.   On: 07/14/2014 16:05   Dg Chest Portable 1 View  07/12/2014   CLINICAL DATA:  Chest pain and tachycardia starting yesterday.  EXAM: PORTABLE CHEST - 1 VIEW  COMPARISON:  03/08/2014  FINDINGS: The heart size and mediastinal contours are within normal limits. Both lungs are clear. The visualized skeletal structures are unremarkable.  IMPRESSION: No active disease.   Electronically Signed   By: Lucienne Capers M.D.   On: 07/12/2014 21:15    Microbiology: No results found for this or any previous visit (from the past 240 hour(s)).   Labs: Basic Metabolic Panel:  Recent Labs Lab 07/11/14 1402 07/12/14 2013 07/12/14 2047  07/12/14 2316 07/13/14 0523 07/14/14 0428 07/15/14 0552  NA 134* 139 140  --  138 137 138  K 2.8* 3.2* 3.1*  --  3.7 4.3 3.6*  CL 95* 94* 98  --  97 100 97  CO2 30 28  --   --  27 26 28   GLUCOSE 234* 350* 359*  --  218* 201* 213*  BUN 11 13 12   --  14 12 13   CREATININE 1.0 1.18* 1.20* 1.18* 0.96 0.89 0.89  CALCIUM 9.5 9.6  --   --  8.5 8.5 8.6  MG  --   --   --   --  1.7 2.2  --    Liver Function Tests:  Recent  Labs Lab 07/11/14 1402  AST 26  ALT 24  ALKPHOS 97  BILITOT 0.4  PROT 8.8*  ALBUMIN 4.2   No results found for this basename: LIPASE, AMYLASE,  in the last 168 hours No results found for this basename: AMMONIA,  in the last 168 hours CBC:  Recent Labs Lab 07/12/14 2013 07/12/14 2047 07/12/14 2316 07/13/14 0523 07/14/14 0428  WBC 5.8  --  6.0 4.8 4.0  HGB 13.8 15.6* 13.1 12.2 11.2*  HCT 39.8 46.0 38.2 35.9* 33.4*  MCV 83.1  --  83.0 83.3 83.7  PLT 294  --  282 240 214   Cardiac Enzymes:  Recent Labs Lab 07/12/14 2314 07/13/14 0146 07/13/14 0523  TROPONINI <0.30 <0.30 <0.30   BNP: BNP (last 3 results) No results found for this basename: PROBNP,  in the last 8760 hours CBG:  Recent Labs Lab 07/14/14 0733 07/14/14 1126 07/14/14 1642 07/14/14 2105 07/15/14 0740  GLUCAP 209* 251* 189* 253* 190*       Signed:  Abigaelle Verley MD [facility] 07/15/2014, 10:43 AM

## 2014-07-15 NOTE — Telephone Encounter (Signed)
She will need to come in to see me first

## 2014-07-16 NOTE — Telephone Encounter (Signed)
Pt is aware she will set up appt. Pt needs referral in order to see [place]. Will have Butch Penny Set pt up for appt.

## 2014-07-16 NOTE — Telephone Encounter (Signed)
Spoke with Butch Penny and she stated she would try and work her in on 08/20/14 at 4:00 in new pt slot

## 2014-07-17 ENCOUNTER — Telehealth: Payer: Self-pay

## 2014-07-17 LAB — ALDOSTERONE + RENIN ACTIVITY W/ RATIO
ALDO / PRA Ratio: 8.3 Ratio (ref 0.9–28.9)
Aldosterone: 17 ng/dL
PRA LC/MS/MS: 2.04 ng/mL/h (ref 0.25–5.82)

## 2014-07-17 NOTE — Telephone Encounter (Signed)
Admit date: 07/12/2014  Discharge date: 07/15/2014  Reason for admission:  Chest Pain  Recommendations for Outpatient Follow-up:  1. Followup with Garnet Koyanagi, DO in 1 week. On followup the patient will need a basic metabolic profile done to followup on electrolytes and renal function. Patient has been diagnosed as new onset diabetes with a hemoglobin A1c of 9.4. Patient has been started on metformin. Outpatient diabetes and need to be followed up upon per PCP. 2. Patient will be called for outpatient followup with Dr. Caryl Comes of cardiology. Patient will be set up for 30 day event monitor. Patient will also be set up for outpatient sleep study   Transition Care Management Follow-up Telephone Call  How have you been since you were released from the hospital?  Adjusting to new diagnosis of diabetes.  Currently taking metformin. Maybe having possible side effects to medication: shakiness, nausea, diarrhea, abdominal cramping, and lethargy.  She continues to have chest pain.  Last episode last night.  Described as a tingling pain on right side of chest.  Radiating tingling pain down right arm. Denies shortness of breath and jaw pain.  No chest pain at the time of call.   Do you understand why you were in the hospital? yes   Do you understand the discharge instrcutions? yes  Items Reviewed:  Medications reviewed: yes, she has not started ASA 81 mg yet.  States that her husband will pick her up some today.  The importance of taking ASA was discussed.    Allergies reviewed: yes  Dietary changes reviewed: yes, carb modified diet discussed briefly with patient.    Referrals reviewed: yes, cardiology appointment scheduled.  Diabetes classes are also scheduled.  Pt was encouraged to attend these to learn more about managing her diabetes successfully.     Functional Questionnaire:   Activities of Daily Living (ADLs):   She states they are independent in the following: ambulation, bathing and  hygiene, feeding, continence, grooming, toileting and dressing States they require assistance with the following: husband prepares meals.     Any transportation issues/concerns?: no, husband provides transporation   Any patient concerns? no   Confirmed importance and date/time of follow-up visits scheduled: yes   Confirmed with patient if condition begins to worsen call PCP or go to the ER.  Patient was given the Call-a-Nurse line [phone]: yes  Hospital follow up appointment scheduled

## 2014-07-17 NOTE — Telephone Encounter (Signed)
Hospital follow up scheduled with [facility] 07/25/14 at 11:30pm.

## 2014-07-22 NOTE — Telephone Encounter (Signed)
FYI

## 2014-07-24 ENCOUNTER — Ambulatory Visit: Payer: Medicare Other

## 2014-07-25 ENCOUNTER — Encounter: Payer: Self-pay | Admitting: Physician Assistant

## 2014-07-25 ENCOUNTER — Ambulatory Visit (INDEPENDENT_AMBULATORY_CARE_PROVIDER_SITE_OTHER): Payer: Medicare Other | Admitting: Physician Assistant

## 2014-07-25 VITALS — BP 138/74 | HR 67 | Temp 98.6°F | Resp 16 | Ht 64.0 in | Wt 216.5 lb

## 2014-07-25 DIAGNOSIS — E1165 Type 2 diabetes mellitus with hyperglycemia: Secondary | ICD-10-CM

## 2014-07-25 DIAGNOSIS — R079 Chest pain, unspecified: Secondary | ICD-10-CM

## 2014-07-25 DIAGNOSIS — S81801A Unspecified open wound, right lower leg, initial encounter: Secondary | ICD-10-CM

## 2014-07-25 DIAGNOSIS — Z23 Encounter for immunization: Secondary | ICD-10-CM | POA: Diagnosis not present

## 2014-07-25 DIAGNOSIS — I200 Unstable angina: Secondary | ICD-10-CM | POA: Diagnosis not present

## 2014-07-25 DIAGNOSIS — IMO0002 Reserved for concepts with insufficient information to code with codable children: Secondary | ICD-10-CM

## 2014-07-25 LAB — BASIC METABOLIC PANEL
BUN: 10 mg/dL (ref 6–23)
CO2: 27 mEq/L (ref 19–32)
Calcium: 8.8 mg/dL (ref 8.4–10.5)
Chloride: 102 mEq/L (ref 96–112)
Creatinine, Ser: 0.8 mg/dL (ref 0.4–1.2)
GFR: 91.66 mL/min (ref >60.00)
Glucose, Bld: 158 mg/dL — ABNORMAL HIGH (ref 70–99)
Potassium: 3.4 mEq/L — ABNORMAL LOW (ref 3.5–5.1)
Sodium: 136 mEq/L (ref 135–145)

## 2014-07-25 LAB — MICROALBUMIN / CREATININE URINE RATIO
Creatinine,U: 136.2 mg/dL
Microalb Creat Ratio: 0.7 mg/g (ref 0.0–30.0)
Microalb, Ur: 1 mg/dL (ref 0.0–1.9)

## 2014-07-25 MED ORDER — GLUCOSE BLOOD VI STRP: ORAL_STRIP | Status: DC

## 2014-07-25 MED ORDER — CEPHALEXIN 500 MG PO CAPS: 500.0000 mg | ORAL_CAPSULE | Freq: Three times a day (TID) | ORAL | Status: DC

## 2014-07-25 MED ORDER — LANCETS MISC: Status: DC

## 2014-07-25 NOTE — Progress Notes (Signed)
Pre visit review using our clinic review tool, if applicable. No additional management support is needed unless otherwise documented below in the visit note/SLS  

## 2014-07-25 NOTE — Progress Notes (Signed)
Patient presents to clinic today for hospital follow-up of nonspecific chest pain and newly diagnosed DM II.  Patient evaluated in ER before being admitted to hospital.  Workup including EKG, labs serial Troponins and Lexiscan were unremarkable for ACS.  Pain thought to be due to abrupt cessation of clonidine..  Patient with current cardiology follow-up, having been set up with Dr. Caryl Comes.  Will be wearing 30-day cardiac event monitor.  Patient endorses no recurrence of chest pain since admission.  Is doing well overall, but c/o wound of RLL caused by unknown etiology.  Denies fever, chills, streaking or drainage.  Of note, patient diagnosed with DM II in hospital with A1C at 9.4.  Denies history of diabetes previously.  Denies polyuria, polydipsia or polyphagia.  Was placed in Metformin but discontinued medication several days after starting due to severe GI upset.  Has been limiting carb intake and checking fasting glucose daily.  Readings average around 130-150 at home without medication.   Past Medical History  Diagnosis Date  . Hypertension   . Depression   . Asthma   . Arthritis   . Heart murmur     Current Outpatient Prescriptions on File Prior to Visit  Medication Sig Dispense Refill  . albuterol (PROVENTIL HFA;VENTOLIN HFA) 108 (90 BASE) MCG/ACT inhaler Inhale 2 puffs into the lungs every 6 (six) hours as needed. For shortness of breath.  1 Inhaler  4  . aspirin EC 81 MG EC tablet Take 1 tablet (81 mg total) by mouth daily.      . cloNIDine (CATAPRES) 0.1 MG tablet Take 1 tablet (0.1 mg total) by mouth 2 (two) times daily.  62 tablet  0  . famotidine (PEPCID) 20 MG tablet Take 1 tablet (20 mg total) by mouth 2 (two) times daily.  30 tablet  0  . potassium chloride 20 MEQ TBCR Take 20 mEq by mouth daily.  30 tablet  0  . traMADol (ULTRAM) 50 MG tablet Take 50 mg by mouth every 6 (six) hours as needed for moderate pain.      Marland Kitchen triamterene-hydrochlorothiazide (MAXZIDE) 75-50 MG per tablet  Take 1 tablet by mouth daily.  90 tablet  1   No current facility-administered medications on file prior to visit.    Allergies  Allergen Reactions  . Codeine Itching    Family History  Problem Relation Age of Onset  . Hypertension Mother   . Asthma Mother   . Hypertension Sister   . Asthma Sister   . Hypertension Maternal Grandmother   . Heart defect Sister   . Asthma Sister   . Aneurysm Sister   . Asthma Sister     History   Social History  . Marital Status: Married    Spouse Name: N/A    Number of Children: N/A  . Years of Education: ged   Occupational History  . disabled    Social History Main Topics  . Smoking status: Never Smoker   . Smokeless tobacco: Never Used  . Alcohol Use: No  . Drug Use: No  . Sexual Activity: Yes    Partners: Male    Birth Control/ Protection: Surgical   Other Topics Concern  . None   Social History Narrative  . None   Review of Systems - See HPI.  All other ROS are negative.  BP 138/74  Pulse 67  Temp(Src) 98.6 F (37 C) (Oral)  Resp 16  Ht $R'5\' 4"'eq$  (1.626 m)  Wt 216 lb 8 oz (  98.204 kg)  BMI 37.14 kg/m2  SpO2 99%  Physical Exam  Vitals reviewed. Constitutional: She is oriented to person, place, and time and well-developed, well-nourished, and in no distress.  HENT:  Head: Normocephalic and atraumatic.  Eyes: Conjunctivae are normal.  Neck: Neck supple.  Cardiovascular: Normal rate, normal heart sounds and intact distal pulses.   Pulmonary/Chest: Effort normal and breath sounds normal. No respiratory distress. She has no wheezes. She has no rales. She exhibits no tenderness.  Neurological: She is alert and oriented to person, place, and time.  Skin: Skin is warm and dry.  Psychiatric: Affect normal.    Recent Results (from the past 2160 hour(s))  CBC WITH DIFFERENTIAL     Status: Abnormal   Collection Time    05/28/14  2:46 PM      Result Value Ref Range   WBC 4.1  4.0 - 10.5 K/uL   RBC 4.39  3.87 - 5.11  Mil/uL   Hemoglobin 12.5  12.0 - 15.0 g/dL   HCT 37.3  36.0 - 46.0 %   MCV 84.9  78.0 - 100.0 fl   MCHC 33.6  30.0 - 36.0 g/dL   RDW 15.3  11.5 - 15.5 %   Platelets 227.0  150.0 - 400.0 K/uL   Neutrophils Relative % 41.0 (*) 43.0 - 77.0 %   Lymphocytes Relative 47.6 (*) 12.0 - 46.0 %   Monocytes Relative 8.2  3.0 - 12.0 %   Eosinophils Relative 2.6  0.0 - 5.0 %   Basophils Relative 0.6  0.0 - 3.0 %   Neutro Abs 1.7  1.4 - 7.7 K/uL   Lymphs Abs 1.9  0.7 - 4.0 K/uL   Monocytes Absolute 0.3  0.1 - 1.0 K/uL   Eosinophils Absolute 0.1  0.0 - 0.7 K/uL   Basophils Absolute 0.0  0.0 - 0.1 K/uL  COMPREHENSIVE METABOLIC PANEL     Status: Abnormal   Collection Time    05/28/14  2:46 PM      Result Value Ref Range   Sodium 139  135 - 145 mEq/L   Potassium 3.1 (*) 3.5 - 5.1 mEq/L   Chloride 100  96 - 112 mEq/L   CO2 29  19 - 32 mEq/L   Glucose, Bld 168 (*) 70 - 99 mg/dL   BUN 9  6 - 23 mg/dL   Creatinine, Ser 0.8  0.4 - 1.2 mg/dL   Total Bilirubin 0.5  0.2 - 1.2 mg/dL   Alkaline Phosphatase 89  39 - 117 U/L   AST 27  0 - 37 U/L   ALT 21  0 - 35 U/L   Total Protein 7.5  6.0 - 8.3 g/dL   Albumin 3.7  3.5 - 5.2 g/dL   Calcium 9.0  8.4 - 10.5 mg/dL   GFR 95.65  >60.00 mL/min  LIPASE     Status: None   Collection Time    05/28/14  2:46 PM      Result Value Ref Range   Lipase 28.0  11.0 - 59.0 U/L  H. PYLORI ANTIBODY, IGG     Status: None   Collection Time    05/28/14  2:46 PM      Result Value Ref Range   H Pylori IgG Negative  Negative  EKG 12-LEAD     Status: Abnormal   Collection Time    07/11/14  1:41 PM      Result Value Ref Range   EKG 12 lead  BASIC METABOLIC PANEL     Status: Abnormal   Collection Time    07/11/14  2:02 PM      Result Value Ref Range   Sodium 134 (*) 135 - 145 mEq/L   Potassium 2.8 (*) 3.5 - 5.1 mEq/L   Chloride 95 (*) 96 - 112 mEq/L   CO2 30  19 - 32 mEq/L   Glucose, Bld 234 (*) 70 - 99 mg/dL   BUN 11  6 - 23 mg/dL   Creatinine, Ser 1.0  0.4 -  1.2 mg/dL   Calcium 9.5  8.4 - 10.5 mg/dL   GFR 79.54  >60.00 mL/min  HEPATIC FUNCTION PANEL     Status: Abnormal   Collection Time    07/11/14  2:02 PM      Result Value Ref Range   Total Bilirubin 0.4  0.2 - 1.2 mg/dL   Bilirubin, Direct 0.0  0.0 - 0.3 mg/dL   Alkaline Phosphatase 97  39 - 117 U/L   AST 26  0 - 37 U/L   ALT 24  0 - 35 U/L   Total Protein 8.8 (*) 6.0 - 8.3 g/dL   Albumin 4.2  3.5 - 5.2 g/dL  LIPID PANEL     Status: None   Collection Time    07/11/14  2:02 PM      Result Value Ref Range   Cholesterol 185  0 - 200 mg/dL   Comment: ATP III Classification       Desirable:  < 200 mg/dL               Borderline High:  200 - 239 mg/dL          High:  > = 240 mg/dL   Triglycerides 139.0  0.0 - 149.0 mg/dL   Comment: Normal:  <150 mg/dLBorderline High:  150 - 199 mg/dL   HDL 59.60  >39.00 mg/dL   VLDL 27.8  0.0 - 40.0 mg/dL   LDL Cholesterol 98  0 - 99 mg/dL   Total CHOL/HDL Ratio 3     Comment:                Men          Women1/2 Average Risk     3.4          3.3Average Risk          5.0          4.42X Average Risk          9.6          7.13X Average Risk          15.0          11.0                       NonHDL 125.40     Comment: NOTE:  Non-HDL goal should be 30 mg/dL higher than patient's LDL goal (i.e. LDL goal of < 70 mg/dL, would have non-HDL goal of < 100 mg/dL)  MICROALBUMIN / CREATININE URINE RATIO     Status: Abnormal   Collection Time    07/11/14  2:02 PM      Result Value Ref Range   Microalb, Ur 17.8 (*) 0.0 - 1.9 mg/dL   Creatinine,U 101.4     Microalb Creat Ratio 17.6  0.0 - 30.0 mg/g  POCT URINALYSIS DIPSTICK     Status: None   Collection Time    07/11/14  2:30 PM      Result Value Ref Range   Color, UA yellow     Clarity, UA clear     Glucose, UA 1     Bilirubin, UA neg     Ketones, UA neg     Spec Grav, UA 1.020     Blood, UA neg     pH, UA 6.5     Protein, UA 1     Urobilinogen, UA 0.2     Nitrite, UA neg     Leukocytes, UA Negative      CBC     Status: None   Collection Time    07/12/14  8:13 PM      Result Value Ref Range   WBC 5.8  4.0 - 10.5 K/uL   RBC 4.79  3.87 - 5.11 MIL/uL   Hemoglobin 13.8  12.0 - 15.0 g/dL   HCT 39.8  36.0 - 46.0 %   MCV 83.1  78.0 - 100.0 fL   MCH 28.8  26.0 - 34.0 pg   MCHC 34.7  30.0 - 36.0 g/dL   RDW 13.9  11.5 - 15.5 %   Platelets 294  150 - 400 K/uL  BASIC METABOLIC PANEL     Status: Abnormal   Collection Time    07/12/14  8:13 PM      Result Value Ref Range   Sodium 139  137 - 147 mEq/L   Potassium 3.2 (*) 3.7 - 5.3 mEq/L   Chloride 94 (*) 96 - 112 mEq/L   CO2 28  19 - 32 mEq/L   Glucose, Bld 350 (*) 70 - 99 mg/dL   BUN 13  6 - 23 mg/dL   Creatinine, Ser 1.18 (*) 0.50 - 1.10 mg/dL   Calcium 9.6  8.4 - 10.5 mg/dL   GFR calc non Af Amer 52 (*) >90 mL/min   GFR calc Af Amer 61 (*) >90 mL/min   Comment: (NOTE)     The eGFR has been calculated using the CKD EPI equation.     This calculation has not been validated in all clinical situations.     eGFR's persistently <90 mL/min signify possible Chronic Kidney     Disease.   Anion gap 17 (*) 5 - 15  I-STAT TROPOININ, ED     Status: None   Collection Time    07/12/14  8:15 PM      Result Value Ref Range   Troponin i, poc 0.01  0.00 - 0.08 ng/mL   Comment 3            Comment: Due to the release kinetics of cTnI,     a negative result within the first hours     of the onset of symptoms does not rule out     myocardial infarction with certainty.     If myocardial infarction is still suspected,     repeat the test at appropriate intervals.  D-DIMER, QUANTITATIVE     Status: None   Collection Time    07/12/14  8:15 PM      Result Value Ref Range   D-Dimer, Quant <0.27  0.00 - 0.48 ug/mL-FEU   Comment:            AT THE INHOUSE ESTABLISHED CUTOFF     VALUE OF 0.48 ug/mL FEU,     THIS ASSAY HAS BEEN DOCUMENTED     IN THE LITERATURE TO HAVE     A SENSITIVITY AND NEGATIVE  PREDICTIVE VALUE OF AT LEAST     98 TO 99%.  THE  TEST RESULT     SHOULD BE CORRELATED WITH     AN ASSESSMENT OF THE CLINICAL     PROBABILITY OF DVT / VTE.  I-STAT CHEM 8, ED     Status: Abnormal   Collection Time    07/12/14  8:47 PM      Result Value Ref Range   Sodium 140  137 - 147 mEq/L   Potassium 3.1 (*) 3.7 - 5.3 mEq/L   Chloride 98  96 - 112 mEq/L   BUN 12  6 - 23 mg/dL   Creatinine, Ser 1.20 (*) 0.50 - 1.10 mg/dL   Glucose, Bld 359 (*) 70 - 99 mg/dL   Calcium, Ion 1.13  1.12 - 1.23 mmol/L   TCO2 34  0 - 100 mmol/L   Hemoglobin 15.6 (*) 12.0 - 15.0 g/dL   HCT 46.0  36.0 - 46.0 %  TROPONIN I     Status: None   Collection Time    07/12/14 11:14 PM      Result Value Ref Range   Troponin I <0.30  <0.30 ng/mL   Comment:            Due to the release kinetics of cTnI,     a negative result within the first hours     of the onset of symptoms does not rule out     myocardial infarction with certainty.     If myocardial infarction is still suspected,     repeat the test at appropriate intervals.  CBC     Status: None   Collection Time    07/12/14 11:16 PM      Result Value Ref Range   WBC 6.0  4.0 - 10.5 K/uL   RBC 4.60  3.87 - 5.11 MIL/uL   Hemoglobin 13.1  12.0 - 15.0 g/dL   HCT 38.2  36.0 - 46.0 %   MCV 83.0  78.0 - 100.0 fL   MCH 28.5  26.0 - 34.0 pg   MCHC 34.3  30.0 - 36.0 g/dL   RDW 14.0  11.5 - 15.5 %   Platelets 282  150 - 400 K/uL  CREATININE, SERUM     Status: Abnormal   Collection Time    07/12/14 11:16 PM      Result Value Ref Range   Creatinine, Ser 1.18 (*) 0.50 - 1.10 mg/dL   GFR calc non Af Amer 52 (*) >90 mL/min   GFR calc Af Amer 61 (*) >90 mL/min   Comment: (NOTE)     The eGFR has been calculated using the CKD EPI equation.     This calculation has not been validated in all clinical situations.     eGFR's persistently <90 mL/min signify possible Chronic Kidney     Disease.  GLUCOSE, CAPILLARY     Status: Abnormal   Collection Time    07/12/14 11:52 PM      Result Value Ref Range    Glucose-Capillary 256 (*) 70 - 99 mg/dL  TROPONIN I     Status: None   Collection Time    07/13/14  1:46 AM      Result Value Ref Range   Troponin I <0.30  <0.30 ng/mL   Comment:            Due to the release kinetics of cTnI,     a negative result within the first hours  of the onset of symptoms does not rule out     myocardial infarction with certainty.     If myocardial infarction is still suspected,     repeat the test at appropriate intervals.  TROPONIN I     Status: None   Collection Time    07/13/14  5:23 AM      Result Value Ref Range   Troponin I <0.30  <0.30 ng/mL   Comment:            Due to the release kinetics of cTnI,     a negative result within the first hours     of the onset of symptoms does not rule out     myocardial infarction with certainty.     If myocardial infarction is still suspected,     repeat the test at appropriate intervals.  BASIC METABOLIC PANEL     Status: Abnormal   Collection Time    07/13/14  5:23 AM      Result Value Ref Range   Sodium 138  137 - 147 mEq/L   Potassium 3.7  3.7 - 5.3 mEq/L   Chloride 97  96 - 112 mEq/L   CO2 27  19 - 32 mEq/L   Glucose, Bld 218 (*) 70 - 99 mg/dL   BUN 14  6 - 23 mg/dL   Creatinine, Ser 0.96  0.50 - 1.10 mg/dL   Calcium 8.5  8.4 - 10.5 mg/dL   GFR calc non Af Amer 67 (*) >90 mL/min   GFR calc Af Amer 78 (*) >90 mL/min   Comment: (NOTE)     The eGFR has been calculated using the CKD EPI equation.     This calculation has not been validated in all clinical situations.     eGFR's persistently <90 mL/min signify possible Chronic Kidney     Disease.   Anion gap 14  5 - 15  CBC     Status: Abnormal   Collection Time    07/13/14  5:23 AM      Result Value Ref Range   WBC 4.8  4.0 - 10.5 K/uL   RBC 4.31  3.87 - 5.11 MIL/uL   Hemoglobin 12.2  12.0 - 15.0 g/dL   HCT 35.9 (*) 36.0 - 46.0 %   MCV 83.3  78.0 - 100.0 fL   MCH 28.3  26.0 - 34.0 pg   MCHC 34.0  30.0 - 36.0 g/dL   RDW 13.9  11.5 - 15.5 %     Platelets 240  150 - 400 K/uL  HEMOGLOBIN A1C     Status: Abnormal   Collection Time    07/13/14  5:23 AM      Result Value Ref Range   Hemoglobin A1C 9.4 (*) <5.7 %   Comment: (NOTE)                                                                               According to the ADA Clinical Practice Recommendations for 2011, when     HbA1c is used as a screening test:      >=6.5%   Diagnostic of Diabetes Mellitus               (  if abnormal result is confirmed)     5.7-6.4%   Increased risk of developing Diabetes Mellitus     References:Diagnosis and Classification of Diabetes Mellitus,Diabetes     JQBH,4193,79(KWIOX 1):S62-S69 and Standards of Medical Care in             Diabetes - 2011,Diabetes BDZH,2992,42 (Suppl 1):S11-S61.   Mean Plasma Glucose 223 (*) <117 mg/dL   Comment: Performed at [facility]     Status: None   Collection Time    07/13/14  5:23 AM      Result Value Ref Range   Magnesium 1.7  1.5 - 2.5 mg/dL  LIPID PANEL     Status: Abnormal   Collection Time    07/13/14  5:23 AM      Result Value Ref Range   Cholesterol 149  0 - 200 mg/dL   Triglycerides 218 (*) <150 mg/dL   HDL 43  >39 mg/dL   Total CHOL/HDL Ratio 3.5     VLDL 44 (*) 0 - 40 mg/dL   LDL Cholesterol 62  0 - 99 mg/dL   Comment:            Total Cholesterol/HDL:CHD Risk     Coronary Heart Disease Risk Table                         Men   Women      1/2 Average Risk   3.4   3.3      Average Risk       5.0   4.4      2 X Average Risk   9.6   7.1      3 X Average Risk  23.4   11.0                Use the calculated Patient Ratio     above and the CHD Risk Table     to determine the patient's CHD Risk.                ATP III CLASSIFICATION (LDL):      <100     mg/dL   Optimal      100-129  mg/dL   Near or Above                        Optimal      130-159  mg/dL   Borderline      160-189  mg/dL   High      >190     mg/dL   Very High  GLUCOSE, CAPILLARY     Status: Abnormal    Collection Time    07/13/14  8:01 AM      Result Value Ref Range   Glucose-Capillary 203 (*) 70 - 99 mg/dL  GLUCOSE, CAPILLARY     Status: Abnormal   Collection Time    07/13/14 11:34 AM      Result Value Ref Range   Glucose-Capillary 233 (*) 70 - 99 mg/dL  TSH     Status: None   Collection Time    07/13/14  1:45 PM      Result Value Ref Range   TSH 1.360  0.350 - 4.500 uIU/mL  ALDOSTERONE + RENIN ACTIVITY W/ RATIO     Status: None   Collection Time    07/13/14  4:15 PM      Result Value Ref Range  PRA LC/MS/MS 2.04  0.25 - 5.82 ng/mL/h   ALDO / PRA Ratio 8.3  0.9 - 28.9 Ratio   Aldosterone 17     Comment: (NOTE)       Adult Reference Ranges for Aldosterone,       LC/MS/MS:        Upright 8:00-10:00 am    < or = 28 ng/dL        Upright 4:00-6:00 pm     < or = 21 ng/dL        Supine  8:00-10:00 am    3-16 ng/dL     Performed at [place], CAPILLARY     Status: Abnormal   Collection Time    07/13/14  4:51 PM      Result Value Ref Range   Glucose-Capillary 136 (*) 70 - 99 mg/dL  GLUCOSE, CAPILLARY     Status: Abnormal   Collection Time    07/13/14  8:26 PM      Result Value Ref Range   Glucose-Capillary 233 (*) 70 - 99 mg/dL  GLUCOSE, CAPILLARY     Status: Abnormal   Collection Time    07/13/14 11:56 PM      Result Value Ref Range   Glucose-Capillary 210 (*) 70 - 99 mg/dL  GLUCOSE, CAPILLARY     Status: Abnormal   Collection Time    07/14/14  4:15 AM      Result Value Ref Range   Glucose-Capillary 210 (*) 70 - 99 mg/dL  BASIC METABOLIC PANEL     Status: Abnormal   Collection Time    07/14/14  4:28 AM      Result Value Ref Range   Sodium 137  137 - 147 mEq/L   Potassium 4.3  3.7 - 5.3 mEq/L   Chloride 100  96 - 112 mEq/L   CO2 26  19 - 32 mEq/L   Glucose, Bld 201 (*) 70 - 99 mg/dL   BUN 12  6 - 23 mg/dL   Creatinine, Ser 0.89  0.50 - 1.10 mg/dL   Calcium 8.5  8.4 - 10.5 mg/dL   GFR calc non Af Amer 74 (*) >90 mL/min   GFR calc Af Amer  86 (*) >90 mL/min   Comment: (NOTE)     The eGFR has been calculated using the CKD EPI equation.     This calculation has not been validated in all clinical situations.     eGFR's persistently <90 mL/min signify possible Chronic Kidney     Disease.   Anion gap 11  5 - 15  CBC     Status: Abnormal   Collection Time    07/14/14  4:28 AM      Result Value Ref Range   WBC 4.0  4.0 - 10.5 K/uL   RBC 3.99  3.87 - 5.11 MIL/uL   Hemoglobin 11.2 (*) 12.0 - 15.0 g/dL   HCT 33.4 (*) 36.0 - 46.0 %   MCV 83.7  78.0 - 100.0 fL   MCH 28.1  26.0 - 34.0 pg   MCHC 33.5  30.0 - 36.0 g/dL   RDW 13.9  11.5 - 15.5 %   Platelets 214  150 - 400 K/uL  MAGNESIUM     Status: None   Collection Time    07/14/14  4:28 AM      Result Value Ref Range   Magnesium 2.2  1.5 - 2.5 mg/dL  GLUCOSE, CAPILLARY     Status: Abnormal  Collection Time    07/14/14  7:33 AM      Result Value Ref Range   Glucose-Capillary 209 (*) 70 - 99 mg/dL  GLUCOSE, CAPILLARY     Status: Abnormal   Collection Time    07/14/14 11:26 AM      Result Value Ref Range   Glucose-Capillary 251 (*) 70 - 99 mg/dL  GLUCOSE, CAPILLARY     Status: Abnormal   Collection Time    07/14/14  4:42 PM      Result Value Ref Range   Glucose-Capillary 189 (*) 70 - 99 mg/dL  GLUCOSE, CAPILLARY     Status: Abnormal   Collection Time    07/14/14  9:05 PM      Result Value Ref Range   Glucose-Capillary 253 (*) 70 - 99 mg/dL  BASIC METABOLIC PANEL     Status: Abnormal   Collection Time    07/15/14  5:52 AM      Result Value Ref Range   Sodium 138  137 - 147 mEq/L   Potassium 3.6 (*) 3.7 - 5.3 mEq/L   Chloride 97  96 - 112 mEq/L   CO2 28  19 - 32 mEq/L   Glucose, Bld 213 (*) 70 - 99 mg/dL   BUN 13  6 - 23 mg/dL   Creatinine, Ser 0.89  0.50 - 1.10 mg/dL   Calcium 8.6  8.4 - 10.5 mg/dL   GFR calc non Af Amer 74 (*) >90 mL/min   GFR calc Af Amer 86 (*) >90 mL/min   Comment: (NOTE)     The eGFR has been calculated using the CKD EPI equation.      This calculation has not been validated in all clinical situations.     eGFR's persistently <90 mL/min signify possible Chronic Kidney     Disease.   Anion gap 13  5 - 15  GLUCOSE, CAPILLARY     Status: Abnormal   Collection Time    07/15/14  7:40 AM      Result Value Ref Range   Glucose-Capillary 190 (*) 70 - 99 mg/dL   Comment 1 Documented in Chart     Comment 2 Notify RN    GLUCOSE, CAPILLARY     Status: Abnormal   Collection Time    07/15/14 11:24 AM      Result Value Ref Range   Glucose-Capillary 206 (*) 70 - 99 mg/dL   Comment 1 Documented in Chart     Comment 2 Notify RN    BASIC METABOLIC PANEL     Status: Abnormal   Collection Time    07/25/14 12:30 PM      Result Value Ref Range   Sodium 136  135 - 145 mEq/L   Potassium 3.4 (*) 3.5 - 5.1 mEq/L   Chloride 102  96 - 112 mEq/L   CO2 27  19 - 32 mEq/L   Glucose, Bld 158 (*) 70 - 99 mg/dL   BUN 10  6 - 23 mg/dL   Creatinine, Ser 0.8  0.4 - 1.2 mg/dL   Calcium 8.8  8.4 - 10.5 mg/dL   GFR 91.66  >60.00 mL/min  MICROALBUMIN / CREATININE URINE RATIO     Status: None   Collection Time    07/25/14 12:30 PM      Result Value Ref Range   Microalb, Ur 1.0  0.0 - 1.9 mg/dL   Creatinine,U 136.2     Microalb Creat Ratio 0.7  0.0 - 30.0  mg/g    Assessment/Plan: Diabetes mellitus Will attempt trial of therapeutic lifestyle changes including diet and exercise, before restarting medication.  There is some concern for hypoglycemia with other affordable PO agents, as Metformin not tolerated and SU would be the next best option giving inexpensiveness of medication.  Discussed this with supervising physician, Dr. Larose Kells, who agrees with 1 month TLC trial.  Continue daily fasitng glucose measurement.  Record in diary.  Diabetic Meal Planning handout given to patient. Encouraged her to consider Medical Nutrition Therapy with Diabetic Nutritionist.  Foot examination completed and unremarkable.  There is a mild injury to RLL with some signs of  infection.  This is being treated with Keflex and wound care.  Patient to follow-up in 2 weeks.  Leg wound, right Rx Keflex.  Keep area clean and dry.  Alarm signs/symptoms discussed with patient.  Follow-up in 2 weeks.  Chest pain Unclear etiology although thought to be due to abrupt cessation of Clonidine. No recurrence of symptoms with resumption of medication.  Asymptomatic at present.  Continue current regimen. Follow-up with Cardiology as scheduled for evaluation and 30-day Holter Monitor Placement.  Follow-up in 2 weeks.  Repeat labs will be checked today.

## 2014-07-25 NOTE — Patient Instructions (Signed)
Please continue medications as directed.  Your blood sugar has dropped significantly with minimal changes to diet.  Please follow the meal planning guide I gave you.  Check blood sugar first thing in the morning, before eating.  Write this number down. Our goal is 80-120.  If you notice a blood sugar > 180, please call the office.  We may have to start a different medication.  Follow-up with me in 2-3 weeks for reassessment of blood glucose levels.  Diabetes and Foot Care Diabetes may cause you to have problems because of poor blood supply (circulation) to your feet and legs. This may cause the skin on your feet to become thinner, break easier, and heal more slowly. Your skin may become dry, and the skin may peel and crack. You may also have nerve damage in your legs and feet causing decreased feeling in them. You may not notice minor injuries to your feet that could lead to infections or more serious problems. Taking care of your feet is one of the most important things you can do for yourself.  HOME CARE INSTRUCTIONS  Wear shoes at all times, even in the house. Do not go barefoot. Bare feet are easily injured.  Check your feet daily for blisters, cuts, and redness. If you cannot see the bottom of your feet, use a mirror or ask someone for help.  Wash your feet with warm water (do not use hot water) and mild soap. Then pat your feet and the areas between your toes until they are completely dry. Do not soak your feet as this can dry your skin.  Apply a moisturizing lotion or petroleum jelly (that does not contain alcohol and is unscented) to the skin on your feet and to dry, brittle toenails. Do not apply lotion between your toes.  Trim your toenails straight across. Do not dig under them or around the cuticle. File the edges of your nails with an emery board or nail file.  Do not cut corns or calluses or try to remove them with medicine.  Wear clean socks or stockings every day. Make sure they are  not too tight. Do not wear knee-high stockings since they may decrease blood flow to your legs.  Wear shoes that fit properly and have enough cushioning. To break in new shoes, wear them for just a few hours a day. This prevents you from injuring your feet. Always look in your shoes before you put them on to be sure there are no objects inside.  Do not cross your legs. This may decrease the blood flow to your feet.  If you find a minor scrape, cut, or break in the skin on your feet, keep it and the skin around it clean and dry. These areas may be cleansed with mild soap and water. Do not cleanse the area with peroxide, alcohol, or iodine.  When you remove an adhesive bandage, be sure not to damage the skin around it.  If you have a wound, look at it several times a day to make sure it is healing.  Do not use heating pads or hot water bottles. They may burn your skin. If you have lost feeling in your feet or legs, you may not know it is happening until it is too late.  Make sure your health care provider performs a complete foot exam at least annually or more often if you have foot problems. Report any cuts, sores, or bruises to your health care provider immediately. SEEK  MEDICAL CARE IF:   You have an injury that is not healing.  You have cuts or breaks in the skin.  You have an ingrown nail.  You notice redness on your legs or feet.  You feel burning or tingling in your legs or feet.  You have pain or cramps in your legs and feet.  Your legs or feet are numb.  Your feet always feel cold. SEEK IMMEDIATE MEDICAL CARE IF:   There is increasing redness, swelling, or pain in or around a wound.  There is a red line that goes up your leg.  Pus is coming from a wound.  You develop a fever or as directed by your health care provider.  You notice a bad smell coming from an ulcer or wound. Document Released: 09/30/2000 Document Revised: 06/05/2013 Document Reviewed:  03/12/2013 [facility] Patient Information 2015 [place], [place]. This information is not intended to replace advice given to you by your health care provider. Make sure you discuss any questions you have with your health care provider.

## 2014-07-28 ENCOUNTER — Telehealth: Payer: Self-pay | Admitting: Family Medicine

## 2014-07-28 NOTE — Telephone Encounter (Signed)
Caller name: Emaya Relation to pt: Call back number: [phone] Pharmacy: [facility]  Reason for call:  Pt was seen by Elyn Aquas on 10/8 for Hospital Follow up.  Pt's pharmacy is going to be faxing over a form for Provider to sign in regards to the Rx glucose blood (BAYER CONTOUR NEXT TEST) test strip.  FYI

## 2014-07-29 NOTE — Telephone Encounter (Signed)
Informed patient of this and she states that she will call pharmacy to get them to refax

## 2014-07-29 NOTE — Telephone Encounter (Signed)
We have not received this form from pharmacy. Please have patient call them and have them fax it. I will complete it as soon as I see it. Thanks. JG//CMA

## 2014-07-29 NOTE — Telephone Encounter (Signed)
Patient would like to know if we received fax regarding this.

## 2014-07-30 NOTE — Telephone Encounter (Signed)
Patient wants to know if we received this form

## 2014-07-31 ENCOUNTER — Ambulatory Visit: Payer: Medicare Other

## 2014-07-31 NOTE — Telephone Encounter (Signed)
Forms were received today and have been completed. I am only waiting on a provider's signature and then I will fax them. JG//CMA

## 2014-08-01 NOTE — Telephone Encounter (Signed)
Signed forms faxed to [facility]. JG//CMA

## 2014-08-03 DIAGNOSIS — E1165 Type 2 diabetes mellitus with hyperglycemia: Secondary | ICD-10-CM

## 2014-08-03 DIAGNOSIS — E1151 Type 2 diabetes mellitus with diabetic peripheral angiopathy without gangrene: Secondary | ICD-10-CM | POA: Insufficient documentation

## 2014-08-03 DIAGNOSIS — S81801A Unspecified open wound, right lower leg, initial encounter: Secondary | ICD-10-CM | POA: Insufficient documentation

## 2014-08-03 DIAGNOSIS — IMO0002 Reserved for concepts with insufficient information to code with codable children: Secondary | ICD-10-CM | POA: Insufficient documentation

## 2014-08-03 NOTE — Assessment & Plan Note (Signed)
Unclear etiology although thought to be due to abrupt cessation of Clonidine. No recurrence of symptoms with resumption of medication.  Asymptomatic at present.  Continue current regimen. Follow-up with Cardiology as scheduled for evaluation and 30-day Holter Monitor Placement.  Follow-up in 2 weeks.  Repeat labs will be checked today.

## 2014-08-03 NOTE — Assessment & Plan Note (Signed)
Will attempt trial of therapeutic lifestyle changes including diet and exercise, before restarting medication.  There is some concern for hypoglycemia with other affordable PO agents, as Metformin not tolerated and SU would be the next best option giving inexpensiveness of medication.  Discussed this with supervising physician, Dr. Larose Kells, who agrees with 1 month TLC trial.  Continue daily fasitng glucose measurement.  Record in diary.  Diabetic Meal Planning handout given to patient. Encouraged her to consider Medical Nutrition Therapy with Diabetic Nutritionist.  Foot examination completed and unremarkable.  There is a mild injury to RLL with some signs of infection.  This is being treated with Keflex and wound care.  Patient to follow-up in 2 weeks.

## 2014-08-03 NOTE — Assessment & Plan Note (Signed)
Rx Keflex.  Keep area clean and dry.  Alarm signs/symptoms discussed with patient.  Follow-up in 2 weeks.

## 2014-08-06 ENCOUNTER — Encounter: Payer: Self-pay | Admitting: *Deleted

## 2014-08-07 ENCOUNTER — Ambulatory Visit: Payer: Medicare Other

## 2014-08-13 DIAGNOSIS — S81801A Unspecified open wound, right lower leg, initial encounter: Secondary | ICD-10-CM

## 2014-08-13 DIAGNOSIS — Z23 Encounter for immunization: Secondary | ICD-10-CM | POA: Diagnosis not present

## 2014-08-13 DIAGNOSIS — R079 Chest pain, unspecified: Secondary | ICD-10-CM

## 2014-08-13 DIAGNOSIS — E1165 Type 2 diabetes mellitus with hyperglycemia: Secondary | ICD-10-CM | POA: Diagnosis not present

## 2014-08-15 ENCOUNTER — Other Ambulatory Visit (HOSPITAL_COMMUNITY)
Admission: RE | Admit: 2014-08-15 | Discharge: 2014-08-15 | Disposition: A | Discharge status: Home / Self Care | Payer: Medicare Other | Source: Ambulatory Visit | Attending: Family Medicine | Admitting: Family Medicine

## 2014-08-15 ENCOUNTER — Ambulatory Visit (INDEPENDENT_AMBULATORY_CARE_PROVIDER_SITE_OTHER): Payer: Medicare Other | Admitting: Family Medicine

## 2014-08-15 ENCOUNTER — Encounter: Payer: Self-pay | Admitting: Family Medicine

## 2014-08-15 VITALS — BP 137/89 | HR 71 | Temp 98.4°F | Wt 224.0 lb

## 2014-08-15 DIAGNOSIS — N760 Acute vaginitis: Secondary | ICD-10-CM | POA: Diagnosis present

## 2014-08-15 DIAGNOSIS — I10 Essential (primary) hypertension: Secondary | ICD-10-CM | POA: Diagnosis not present

## 2014-08-15 DIAGNOSIS — I200 Unstable angina: Secondary | ICD-10-CM | POA: Diagnosis not present

## 2014-08-15 DIAGNOSIS — T798XXD Other early complications of trauma, subsequent encounter: Secondary | ICD-10-CM | POA: Diagnosis not present

## 2014-08-15 DIAGNOSIS — R300 Dysuria: Secondary | ICD-10-CM | POA: Diagnosis not present

## 2014-08-15 LAB — POCT URINALYSIS DIPSTICK
Bilirubin, UA: NEGATIVE
Blood, UA: NEGATIVE
Glucose, UA: NEGATIVE
Ketones, UA: NEGATIVE
Leukocytes, UA: NEGATIVE
Nitrite, UA: NEGATIVE
Protein, UA: NEGATIVE
Spec Grav, UA: 1.015
Urobilinogen, UA: 4
pH, UA: 8

## 2014-08-15 MED ORDER — CLONIDINE HCL 0.2 MG PO TABS: 0.2000 mg | ORAL_TABLET | Freq: Two times a day (BID) | ORAL | Status: DC

## 2014-08-15 MED ORDER — FLUCONAZOLE 150 MG PO TABS: ORAL_TABLET | ORAL | Status: DC

## 2014-08-15 NOTE — Progress Notes (Signed)
Pre visit review using our clinic review tool, if applicable. No additional management support is needed unless otherwise documented below in the visit note. 

## 2014-08-15 NOTE — Patient Instructions (Signed)

## 2014-08-15 NOTE — Progress Notes (Signed)
   Subjective:    Patient ID: Heather Walker, female    DOB: [birth year], 53 y.o.   MRN: [id number]  HPI Pt here to f/u wound R leg.  She states it has resolved.  Her bp has been running high.  No sob or chest pain but states she was irritated with her husband and thought she was going to be late.  She c/o vaginal itching.   Review of Systems    as above Objective:   Physical Exam BP 137/89  Pulse 71  Temp(Src) 98.4 F (36.9 C) (Oral)  Wt 224 lb (101.606 kg)  SpO2 97% General appearance: alert, cooperative, appears stated age and no distress Throat: lips, mucosa, and tongue normal; teeth and gums normal Neck: no adenopathy, no carotid bruit, no JVD, supple, symmetrical, trachea midline and thyroid not enlarged, symmetric, no tenderness/mass/nodules Lungs: clear to auscultation bilaterally Heart: S1, S2 normal Pelvic: cervix normal in appearance, external genitalia normal, no cervical motion tenderness, positive findings: vaginal discharge:  white and thick and curd like Extremities: extremities normal, atraumatic, no cyanosis or edema        Assessment & Plan:  1. Dysuria  - POCT Urinalysis Dipstick  2. Vaginitis and vulvovaginitis Culture done - fluconazole (DIFLUCAN) 150 MG tablet; 1 po qd x1, may repeat in 3 days prn  Dispense: 2 tablet; Refill: 0  3. Post-traumatic wound infection, subsequent encounter resolved  4. Essential hypertension Inc clonidine to 0.2 F/u 2 weeks or sooner prn - cloNIDine (CATAPRES) 0.2 MG tablet; Take 1 tablet (0.2 mg total) by mouth 2 (two) times daily.  Dispense: 60 tablet; Refill: 2

## 2014-08-18 ENCOUNTER — Encounter: Payer: Self-pay | Admitting: Family Medicine

## 2014-08-18 ENCOUNTER — Ambulatory Visit: Payer: Medicare Other | Admitting: Family Medicine

## 2014-08-19 LAB — CERVICOVAGINAL ANCILLARY ONLY
Wet Prep (BD Affirm): NEGATIVE
Wet Prep (BD Affirm): NEGATIVE
Wet Prep (BD Affirm): NEGATIVE

## 2014-08-20 ENCOUNTER — Encounter: Payer: Self-pay | Admitting: Cardiology

## 2014-08-20 ENCOUNTER — Ambulatory Visit (INDEPENDENT_AMBULATORY_CARE_PROVIDER_SITE_OTHER): Payer: Medicare Other | Admitting: Cardiology

## 2014-08-20 VITALS — BP 150/106 | HR 64 | Ht 63.0 in | Wt 220.1 lb

## 2014-08-20 DIAGNOSIS — I200 Unstable angina: Secondary | ICD-10-CM

## 2014-08-20 DIAGNOSIS — R079 Chest pain, unspecified: Secondary | ICD-10-CM

## 2014-08-20 DIAGNOSIS — K219 Gastro-esophageal reflux disease without esophagitis: Secondary | ICD-10-CM | POA: Diagnosis not present

## 2014-08-20 DIAGNOSIS — I10 Essential (primary) hypertension: Secondary | ICD-10-CM | POA: Diagnosis not present

## 2014-08-20 DIAGNOSIS — R000 Tachycardia, unspecified: Secondary | ICD-10-CM | POA: Diagnosis not present

## 2014-08-20 DIAGNOSIS — G4710 Hypersomnia, unspecified: Secondary | ICD-10-CM

## 2014-08-20 DIAGNOSIS — R002 Palpitations: Secondary | ICD-10-CM | POA: Diagnosis not present

## 2014-08-20 MED ORDER — PANTOPRAZOLE SODIUM 40 MG PO TBEC: 40.0000 mg | DELAYED_RELEASE_TABLET | Freq: Every day | ORAL | Status: DC

## 2014-08-20 NOTE — Patient Instructions (Signed)
Your physician recommends that you schedule a follow-up appointment in: 4 weeks with Dr. Radford Pax.  Your physician has recommended you make the following change in your medication:  1) STOP Pepcid 2) START Protonix 40 mg daily  Your physician has recommended that you have a sleep study. This test records several body functions during sleep, including: brain activity, eye movement, oxygen and carbon dioxide blood levels, heart rate and rhythm, breathing rate and rhythm, the flow of air through your mouth and nose, snoring, body muscle movements, and chest and belly movement.  Your physician has recommended that you wear an event monitor. Event monitors are medical devices that record the heart's electrical activity. Doctors most often [place] these monitors to diagnose arrhythmias. Arrhythmias are problems with the speed or rhythm of the heartbeat. The monitor is a small, portable device. You can wear one while you do your normal daily activities. This is usually used to diagnose what is causing palpitations/syncope (passing out).

## 2014-08-20 NOTE — Progress Notes (Signed)
[address] [place], [place]  [address] Phone: [phone] Fax:  [phone]  Date:  08/20/2014   ID:  Heather Walker, Heather Walker 31-Jul-1961, MRN [id number]  PCP:  Garnet Koyanagi, DO  Cardiologist:  Fransico Him, MD    History of Present Illness: This is a 53yo female with a history of HTN, depression and asthma who presented to the ER with complaints of chest pain.  It was felt that her symptoms may have been due to abrupt cessation of Clonidine.  She ruled out for MI and underwent nuclear stress test showed no ischemia. 2D echo showed normal LVF.  Cardiac enzymes were negative.  She did have some sinus tachycardia during her hospitalization and heart monitor was ordered but she never got it.  It was also recommended by Dr. Caryl Comes that patient be set up for outpt sleep study.  This has not been done yet.  She is here today to be evaluated for her possible sleep apnea.  Dr. Caryl Comes had recommended it in the hospital but no reason in  Chart noted.  Her husband says that she snores in her sleep and he has noticed her stop breathing.  She feels refreshed in the am but falls asleep easily during the day and her husband says she falls asleep easily sitting in the chair.  She wakes up frequently at night.  She gets up at 10 or 11am and says that she really does not sleep much at night at all.  She says that she is still getting the chest pain that is a sharp pain that lasts a few seconds and then sometimes it is a heaviness.   Wt Readings from Last 3 Encounters:  08/20/14 220 lb 1.9 oz (99.846 kg)  08/15/14 224 lb (101.606 kg)  07/25/14 216 lb 8 oz (98.204 kg)     Past Medical History  Diagnosis Date  . Hypertension   . Depression   . Asthma   . Arthritis   . Heart murmur     Current Outpatient Prescriptions  Medication Sig Dispense Refill  . albuterol (PROVENTIL HFA;VENTOLIN HFA) 108 (90 BASE) MCG/ACT inhaler Inhale 2 puffs into the lungs every 6 (six) hours as needed. For shortness of  breath. 1 Inhaler 4  . aspirin EC 81 MG EC tablet Take 1 tablet (81 mg total) by mouth daily.    . cloNIDine (CATAPRES) 0.2 MG tablet Take 1 tablet (0.2 mg total) by mouth 2 (two) times daily. 60 tablet 2  . famotidine (PEPCID) 20 MG tablet Take 1 tablet (20 mg total) by mouth 2 (two) times daily. 30 tablet 0  . fluconazole (DIFLUCAN) 150 MG tablet 1 po qd x1, may repeat in 3 days prn 2 tablet 0  . fluconazole (DIFLUCAN) 150 MG tablet 1 po qd x1, may repeat in 3 days prn 2 tablet 0  . glucose blood (BAYER CONTOUR NEXT TEST) test strip Use as instructed to test Blood Glucose daily Dx: E11.9 100 each 5  . Lancets MISC USE AS DIRECTED TO TEST BLOOD GLUCOSE 100 each 5  . potassium chloride 20 MEQ TBCR Take 20 mEq by mouth daily. 30 tablet 0  . traMADol (ULTRAM) 50 MG tablet Take 50 mg by mouth every 6 (six) hours as needed for moderate pain.    Marland Kitchen triamterene-hydrochlorothiazide (MAXZIDE) 75-50 MG per tablet Take 1 tablet by mouth daily. 90 tablet 1   No current facility-administered medications for this visit.    Allergies:  Allergies  Allergen Reactions  . Codeine Itching    Social History:  The patient  reports that she has never smoked. She has never used smokeless tobacco. She reports that she does not drink alcohol or use illicit drugs.   Family History:  The patient's family history includes Aneurysm in her sister; Asthma in her mother, sister, sister, and sister; Heart defect in her sister; Hypertension in her maternal grandmother, mother, and sister.   ROS:  Please see the history of present illness.      All other systems reviewed and negative.   PHYSICAL EXAM: VS:  BP 150/106 mmHg  Pulse 64  Ht 5\' 3"  (1.6 m)  Wt 220 lb 1.9 oz (99.846 kg)  BMI 39.00 kg/m2 Well nourished, well developed, in no acute distress HEENT: normal Neck: no JVD Cardiac:  normal S1, S2; RRR; no murmur Lungs:  clear to auscultation bilaterally, no wheezing, rhonchi or rales Abd: soft, nontender, no  hepatomegaly Ext: no edema Skin: warm and dry Neuro:  CNs 2-12 intact, no focal abnormalities noted       ASSESSMENT AND PLAN:  1. Atypical chest pain with negative cardiac workup but still persists despite pepcid.  She does have a history of GERD - stop pepcid and start protonix 40mg  daily 2.  Sinus tachycardia in hospital - I will go ahead and get an [employer] monittor 3.  ? Sleep apnea with nighttime snoring and witnessed apnea and daytime sleepiness and easily falls asleep during the day.  I will arrange a nocturnal PSG to rule out OSA 4.  HTN - poorly controlled but she has not taken her meds today.  I will have her come back for a nurse visit in 1 week for BP check.  I stressed the importance that she take the clonidine twice daily as prescribe.  Followup with me in 4 weeks .   Signed, Fransico Him, MD [facility] 08/20/2014 4:37 PM

## 2014-08-27 ENCOUNTER — Encounter (INDEPENDENT_AMBULATORY_CARE_PROVIDER_SITE_OTHER): Payer: Medicare Other

## 2014-08-27 ENCOUNTER — Ambulatory Visit (INDEPENDENT_AMBULATORY_CARE_PROVIDER_SITE_OTHER): Payer: Medicare Other | Admitting: *Deleted

## 2014-08-27 ENCOUNTER — Encounter: Payer: Self-pay | Admitting: *Deleted

## 2014-08-27 VITALS — BP 168/96 | HR 64

## 2014-08-27 DIAGNOSIS — R002 Palpitations: Secondary | ICD-10-CM | POA: Diagnosis not present

## 2014-08-27 DIAGNOSIS — I10 Essential (primary) hypertension: Secondary | ICD-10-CM

## 2014-08-27 DIAGNOSIS — R000 Tachycardia, unspecified: Secondary | ICD-10-CM

## 2014-08-27 NOTE — Patient Instructions (Signed)
Your physician recommends that you schedule a follow-up appointment in one week for nurse room visit for blood pressure check. Scheduled for 1:00 on September 03, 2014

## 2014-08-27 NOTE — Progress Notes (Signed)
Patient ID: Heather Walker, female   DOB: 11/16/1960, 53 y.o.   MRN: [id number] [employer] 30 day cardiac event monitor applied to patient.

## 2014-08-27 NOTE — Progress Notes (Signed)
Pt here for blood pressure check.  Also had monitor applied. She reports she is feeling well. Taking all medications as listed.  Takes medicines at 11 AM and 11 PM.  Has not taken today's morning medicines. Vital signs reviewed with Dr. Radford Pax. Pt will need afternoon nurse room visit for blood pressure in one week.  Scheduled for September 03, 2014 at 1:00.  Pt aware of appt and to continue all medications.

## 2014-09-01 ENCOUNTER — Telehealth: Payer: Self-pay | Admitting: *Deleted

## 2014-09-01 ENCOUNTER — Ambulatory Visit: Payer: Medicare Other | Admitting: Family Medicine

## 2014-09-01 NOTE — Telephone Encounter (Signed)
Please call her-- she normally shows up

## 2014-09-01 NOTE — Telephone Encounter (Signed)
Pt did not show for appointment 09/01/2014 at 2pm for 2 week follow up

## 2014-09-02 NOTE — Telephone Encounter (Signed)
Pt forgot about appointment. Had heart monitor put on last week. She rescheduled for 09/18/2014 for follow up

## 2014-09-04 ENCOUNTER — Encounter (HOSPITAL_COMMUNITY): Payer: Self-pay | Admitting: Emergency Medicine

## 2014-09-04 ENCOUNTER — Emergency Department (HOSPITAL_COMMUNITY): Payer: Medicare Other

## 2014-09-04 ENCOUNTER — Emergency Department (HOSPITAL_COMMUNITY)
Admission: EM | Admit: 2014-09-04 | Discharge: 2014-09-04 | Disposition: A | Discharge status: Home / Self Care | Payer: Medicare Other | Attending: Emergency Medicine | Admitting: Emergency Medicine

## 2014-09-04 DIAGNOSIS — R079 Chest pain, unspecified: Secondary | ICD-10-CM | POA: Diagnosis not present

## 2014-09-04 DIAGNOSIS — M25511 Pain in right shoulder: Secondary | ICD-10-CM | POA: Diagnosis not present

## 2014-09-04 DIAGNOSIS — Z79899 Other long term (current) drug therapy: Secondary | ICD-10-CM | POA: Diagnosis not present

## 2014-09-04 DIAGNOSIS — F329 Major depressive disorder, single episode, unspecified: Secondary | ICD-10-CM | POA: Diagnosis not present

## 2014-09-04 DIAGNOSIS — R011 Cardiac murmur, unspecified: Secondary | ICD-10-CM | POA: Insufficient documentation

## 2014-09-04 DIAGNOSIS — Z7982 Long term (current) use of aspirin: Secondary | ICD-10-CM | POA: Diagnosis not present

## 2014-09-04 DIAGNOSIS — M1990 Unspecified osteoarthritis, unspecified site: Secondary | ICD-10-CM | POA: Insufficient documentation

## 2014-09-04 DIAGNOSIS — R0789 Other chest pain: Secondary | ICD-10-CM | POA: Diagnosis not present

## 2014-09-04 DIAGNOSIS — J45909 Unspecified asthma, uncomplicated: Secondary | ICD-10-CM | POA: Insufficient documentation

## 2014-09-04 DIAGNOSIS — I10 Essential (primary) hypertension: Secondary | ICD-10-CM | POA: Diagnosis not present

## 2014-09-04 DIAGNOSIS — R0602 Shortness of breath: Secondary | ICD-10-CM | POA: Diagnosis not present

## 2014-09-04 LAB — CBC WITH DIFFERENTIAL/PLATELET
Basophils Absolute: 0 10*3/uL (ref 0.0–0.1)
Basophils Relative: 0 % (ref 0–1)
Eosinophils Absolute: 0.1 10*3/uL (ref 0.0–0.7)
Eosinophils Relative: 3 % (ref 0–5)
HCT: 38 % (ref 36.0–46.0)
Hemoglobin: 12.6 g/dL (ref 12.0–15.0)
Lymphocytes Relative: 48 % — ABNORMAL HIGH (ref 12–46)
Lymphs Abs: 1.9 10*3/uL (ref 0.7–4.0)
MCH: 27.9 pg (ref 26.0–34.0)
MCHC: 33.2 g/dL (ref 30.0–36.0)
MCV: 84.3 fL (ref 78.0–100.0)
Monocytes Absolute: 0.4 10*3/uL (ref 0.1–1.0)
Monocytes Relative: 10 % (ref 3–12)
Neutro Abs: 1.6 10*3/uL — ABNORMAL LOW (ref 1.7–7.7)
Neutrophils Relative %: 39 % — ABNORMAL LOW (ref 43–77)
Platelets: 245 10*3/uL (ref 150–400)
RBC: 4.51 MIL/uL (ref 3.87–5.11)
RDW: 14.1 % (ref 11.5–15.5)
WBC: 4.1 10*3/uL (ref 4.0–10.5)

## 2014-09-04 LAB — COMPREHENSIVE METABOLIC PANEL
ALT: 19 U/L (ref 0–35)
AST: 19 U/L (ref 0–37)
Albumin: 3.8 g/dL (ref 3.5–5.2)
Alkaline Phosphatase: 90 U/L (ref 39–117)
Anion gap: 13 (ref 5–15)
BUN: 7 mg/dL (ref 6–23)
CO2: 29 mEq/L (ref 19–32)
Calcium: 8.7 mg/dL (ref 8.4–10.5)
Chloride: 101 mEq/L (ref 96–112)
Creatinine, Ser: 0.65 mg/dL (ref 0.50–1.10)
GFR calc Af Amer: >90 mL/min (ref >90)
GFR calc non Af Amer: >90 mL/min (ref >90)
Glucose, Bld: 195 mg/dL — ABNORMAL HIGH (ref 70–99)
Potassium: 3.8 mEq/L (ref 3.7–5.3)
Sodium: 143 mEq/L (ref 137–147)
Total Bilirubin: 0.3 mg/dL (ref 0.3–1.2)
Total Protein: 7.9 g/dL (ref 6.0–8.3)

## 2014-09-04 LAB — I-STAT TROPONIN, ED: Troponin i, poc: 0 ng/mL (ref 0.00–0.08)

## 2014-09-04 MED ORDER — CLONIDINE HCL 0.2 MG PO TABS
0.2000 mg | ORAL_TABLET | Freq: Once | ORAL | Status: AC
  Administered 2014-09-04: 0.2 mg via ORAL
  Filled 2014-09-04: qty 1

## 2014-09-04 MED ORDER — OXYCODONE-ACETAMINOPHEN 5-325 MG PO TABS: 1.0000 | ORAL_TABLET | Freq: Four times a day (QID) | ORAL | Status: DC | PRN

## 2014-09-04 MED ORDER — OXYCODONE-ACETAMINOPHEN 5-325 MG PO TABS
1.0000 | ORAL_TABLET | Freq: Once | ORAL | Status: AC
  Administered 2014-09-04: 1 via ORAL
  Filled 2014-09-04: qty 1

## 2014-09-04 MED ORDER — KETOROLAC TROMETHAMINE 60 MG/2ML IM SOLN
60.0000 mg | Freq: Once | INTRAMUSCULAR | Status: AC
  Administered 2014-09-04: 60 mg via INTRAMUSCULAR
  Filled 2014-09-04: qty 2

## 2014-09-04 MED ORDER — ONDANSETRON HCL 4 MG PO TABS: 4.0000 mg | ORAL_TABLET | Freq: Four times a day (QID) | ORAL | Status: DC

## 2014-09-04 NOTE — Discharge Instructions (Signed)
Her blood pressure was very high today in the emergency department. Please remember to take her blood pressure medicines as prescribed. If you develop chest pain, shortness of breath, fevers, or new concerning symptoms please get rechecked immediately.   Arthralgia Your caregiver has diagnosed you as suffering from an arthralgia. Arthralgia means there is pain in a joint. This can come from many reasons including:  Bruising the joint which causes soreness (inflammation) in the joint.  Wear and tear on the joints which occur as we grow older (osteoarthritis).  Overusing the joint.  Various forms of arthritis.  Infections of the joint. Regardless of the cause of pain in your joint, most of these different pains respond to anti-inflammatory drugs and rest. The exception to this is when a joint is infected, and these cases are treated with antibiotics, if it is a bacterial infection. HOME CARE INSTRUCTIONS   Rest the injured area for as long as directed by your caregiver. Then slowly start using the joint as directed by your caregiver and as the pain allows. Crutches as directed may be useful if the ankles, knees or hips are involved. If the knee was splinted or casted, continue use and care as directed. If an stretchy or elastic wrapping bandage has been applied today, it should be removed and re-applied every 3 to 4 hours. It should not be applied tightly, but firmly enough to keep swelling down. Watch toes and feet for swelling, bluish discoloration, coldness, numbness or excessive pain. If any of these problems (symptoms) occur, remove the ace bandage and re-apply more loosely. If these symptoms persist, contact your caregiver or return to this location.  For the first 24 hours, keep the injured extremity elevated on pillows while lying down.  Apply ice for 15-20 minutes to the sore joint every couple hours while awake for the first half day. Then 03-04 times per day for the first 48 hours. Put  the ice in a plastic bag and place a towel between the bag of ice and your skin.  Wear any splinting, casting, elastic bandage applications, or slings as instructed.  Only take over-the-counter or prescription medicines for pain, discomfort, or fever as directed by your caregiver. Do not use aspirin immediately after the injury unless instructed by your physician. Aspirin can cause increased bleeding and bruising of the tissues.  If you were given crutches, continue to use them as instructed and do not resume weight bearing on the sore joint until instructed. Persistent pain and inability to use the sore joint as directed for more than 2 to 3 days are warning signs indicating that you should see a caregiver for a follow-up visit as soon as possible. Initially, a hairline fracture (break in bone) may not be evident on X-rays. Persistent pain and swelling indicate that further evaluation, non-weight bearing or use of the joint (use of crutches or slings as instructed), or further X-rays are indicated. X-rays may sometimes not show a small fracture until a week or 10 days later. Make a follow-up appointment with your own caregiver or one to whom we have referred you. A radiologist (specialist in reading X-rays) may read your X-rays. Make sure you know how you are to obtain your X-ray results. Do not assume everything is normal if you do not hear from [place]. SEEK MEDICAL CARE IF: Bruising, swelling, or pain increases. SEEK IMMEDIATE MEDICAL CARE IF:   Your fingers or toes are numb or blue.  The pain is not responding to medications and  continues to stay the same or get worse.  The pain in your joint becomes severe.  You develop a fever over 102 F (38.9 C).  It becomes impossible to move or use the joint. MAKE SURE YOU:   Understand these instructions.  Will watch your condition.  Will get help right away if you are not doing well or get worse. Document Released: 10/03/2005 Document Revised:  12/26/2011 Document Reviewed: 05/21/2008 [facility] Patient Information 2015 [place], [place]. This information is not intended to replace advice given to you by your health care provider. Make sure you discuss any questions you have with your health care provider.  Chest Pain (Nonspecific) It is often hard to give a specific diagnosis for the cause of chest pain. There is always a chance that your pain could be related to something serious, such as a heart attack or a blood clot in the lungs. You need to follow up with your health care provider for further evaluation. CAUSES   Heartburn.  Pneumonia or bronchitis.  Anxiety or stress.  Inflammation around your heart (pericarditis) or lung (pleuritis or pleurisy).  A blood clot in the lung.  A collapsed lung (pneumothorax). It can develop suddenly on its own (spontaneous pneumothorax) or from trauma to the chest.  Shingles infection (herpes zoster virus). The chest wall is composed of bones, muscles, and cartilage. Any of these can be the source of the pain.  The bones can be bruised by injury.  The muscles or cartilage can be strained by coughing or overwork.  The cartilage can be affected by inflammation and become sore (costochondritis). DIAGNOSIS  Lab tests or other studies may be needed to find the cause of your pain. Your health care provider may have you take a test called an ambulatory electrocardiogram (ECG). An ECG records your heartbeat patterns over a 24-hour period. You may also have other tests, such as:  Transthoracic echocardiogram (TTE). During echocardiography, sound waves are used to evaluate how blood flows through your heart.  Transesophageal echocardiogram (TEE).  Cardiac monitoring. This allows your health care provider to monitor your heart rate and rhythm in real time.  Holter monitor. This is a portable device that records your heartbeat and can help diagnose heart arrhythmias. It allows your health care  provider to track your heart activity for several days, if needed.  Stress tests by exercise or by giving medicine that makes the heart beat faster. TREATMENT   Treatment depends on what may be causing your chest pain. Treatment may include:  Acid blockers for heartburn.  Anti-inflammatory medicine.  Pain medicine for inflammatory conditions.  Antibiotics if an infection is present.  You may be advised to change lifestyle habits. This includes stopping smoking and avoiding alcohol, caffeine, and chocolate.  You may be advised to keep your head raised (elevated) when sleeping. This reduces the chance of acid going backward from your stomach into your esophagus. Most of the time, nonspecific chest pain will improve within 2-3 days with rest and mild pain medicine.  HOME CARE INSTRUCTIONS   If antibiotics were prescribed, take them as directed. Finish them even if you start to feel better.  For the next few days, avoid physical activities that bring on chest pain. Continue physical activities as directed.  Do not use any tobacco products, including cigarettes, chewing tobacco, or electronic cigarettes.  Avoid drinking alcohol.  Only take medicine as directed by your health care provider.  Follow your health care provider's suggestions for further testing if your  chest pain does not go away.  Keep any follow-up appointments you made. If you do not go to an appointment, you could develop lasting (chronic) problems with pain. If there is any problem keeping an appointment, call to reschedule. SEEK MEDICAL CARE IF:   Your chest pain does not go away, even after treatment.  You have a rash with blisters on your chest.  You have a fever. SEEK IMMEDIATE MEDICAL CARE IF:   You have increased chest pain or pain that spreads to your arm, neck, jaw, back, or abdomen.  You have shortness of breath.  You have an increasing cough, or you cough up blood.  You have severe back or  abdominal pain.  You feel nauseous or vomit.  You have severe weakness.  You faint.  You have chills. This is an emergency. Do not wait to see if the pain will go away. Get medical help at once. Call your local emergency services (911 in [place] Do not drive yourself to the hospital. MAKE SURE YOU:   Understand these instructions.  Will watch your condition.  Will get help right away if you are not doing well or get worse. Document Released: 07/13/2005 Document Revised: 10/08/2013 Document Reviewed: 05/08/2008 [facility] Patient Information 2015 [place], [place]. This information is not intended to replace advice given to you by your health care provider. Make sure you discuss any questions you have with your health care provider.

## 2014-09-04 NOTE — ED Provider Notes (Addendum)
CSN: [id number]     Arrival date & time 09/04/14  1156 History   First MD Initiated Contact with Patient 09/04/14 1306     Chief Complaint  Patient presents with  . Chest Pain    HPI Heather Walker presents with 1 week of right posterior shoulder pain that radiates to her right anterior chest. The pain is described as sharp in nature and intermittent. Pain is worse with movement of the right arm. She has intermittent shortness of breath in association with this right arm/shoulder pain. She denies fevers, cough, abdominal pain, leg swelling or pain. She has no history of DVT and does not take oral hormone medications. She has no history of injury to the right arm. She is right-handed. She currently has a Holter monitor which has been on for 1 week for a history of palpitations. Symptoms are described as moderate in nature.   Past Medical History  Diagnosis Date  . Hypertension   . Depression   . Asthma   . Arthritis   . Heart murmur    Past Surgical History  Procedure Laterality Date  . Abdominal hysterectomy    . Bladder suspension    . Appendectomy    . Cesarean section      3 previous  . Tubal ligation     Family History  Problem Relation Age of Onset  . Hypertension Mother   . Asthma Mother   . Hypertension Sister   . Asthma Sister   . Hypertension Maternal Grandmother   . Heart defect Sister   . Asthma Sister   . Aneurysm Sister   . Asthma Sister    History  Substance Use Topics  . Smoking status: Never Smoker   . Smokeless tobacco: Never Used  . Alcohol Use: No   OB History    Gravida Para Term Preterm AB TAB SAB Ectopic Multiple Living   3 3 3       3      Review of Systems  All other systems reviewed and are negative.     Allergies  Codeine  Home Medications   Prior to Admission medications   Medication Sig Start Date End Date Taking? Authorizing Provider  albuterol (PROVENTIL HFA;VENTOLIN HFA) 108 (90 BASE) MCG/ACT inhaler Inhale 2 puffs into the  lungs every 6 (six) hours as needed. For shortness of breath. 07/11/14   Rosalita Chessman, DO  aspirin EC 81 MG EC tablet Take 1 tablet (81 mg total) by mouth daily. 07/15/14   Eugenie Filler, MD  cloNIDine (CATAPRES) 0.2 MG tablet Take 1 tablet (0.2 mg total) by mouth 2 (two) times daily. 08/15/14   Rosalita Chessman, DO  fluconazole (DIFLUCAN) 150 MG tablet 1 po qd x1, may repeat in 3 days prn 08/15/14   Rosalita Chessman, DO  fluconazole (DIFLUCAN) 150 MG tablet 1 po qd x1, may repeat in 3 days prn 08/15/14   Rosalita Chessman, DO  glucose blood (BAYER CONTOUR NEXT TEST) test strip Use as instructed to test Blood Glucose daily Dx: E11.9 07/25/14   Brunetta Jeans, PA-C  Lancets MISC USE AS DIRECTED TO TEST BLOOD GLUCOSE 07/25/14   Brunetta Jeans, PA-C  pantoprazole (PROTONIX) 40 MG tablet Take 1 tablet (40 mg total) by mouth daily. 08/20/14   Sueanne Margarita, MD  potassium chloride 20 MEQ TBCR Take 20 mEq by mouth daily. 07/15/14   Eugenie Filler, MD  traMADol (ULTRAM) 50 MG tablet Take 50 mg by mouth  every 6 (six) hours as needed for moderate pain.    Historical Provider, MD  triamterene-hydrochlorothiazide (MAXZIDE) 75-50 MG per tablet Take 1 tablet by mouth daily. 07/11/14   Yvonne R Lowne, DO   BP 199/117 mmHg  Pulse 72  Temp(Src) 97.5 F (36.4 C) (Oral)  Resp 24  Ht 5\' 2"  (1.575 m)  Wt 220 lb (99.791 kg)  BMI 40.23 kg/m2  SpO2 99% Physical Exam  Constitutional: She is oriented to person, place, and time. She appears well-developed and well-nourished.  HENT:  Head: Normocephalic and atraumatic.  Cardiovascular: Normal rate and regular rhythm.   No murmur heard. Pulmonary/Chest: Effort normal and breath sounds normal. No respiratory distress.  Mild tenderness of her right anterior chest wall.  Abdominal: Soft. There is no tenderness. There is no rebound and no guarding.  Musculoskeletal: She exhibits no edema or tenderness.  2+ radial pulses in bilateral upper extremities. Mild tenderness  over the right posterior shoulder. Pain reproduced reproducible with abduction of the shoulder, with range of motion of the right shoulder intact.  Neurological: She is alert and oriented to person, place, and time.  5 out of 5 grip strength in bilateral upper extremities  Skin: Skin is warm and dry.  Psychiatric: She has a normal mood and affect. Her behavior is normal.  Nursing note and vitals reviewed.   ED Course  Procedures (including critical care time) Labs Review Labs Reviewed  COMPREHENSIVE METABOLIC PANEL - Abnormal; Notable for the following:    Glucose, Bld 195 (*)    All other components within normal limits  CBC WITH DIFFERENTIAL - Abnormal; Notable for the following:    Neutrophils Relative % 39 (*)    Neutro Abs 1.6 (*)    Lymphocytes Relative 48 (*)    All other components within normal limits  I-STAT TROPOININ, ED    Imaging Review Dg Chest 2 View  09/04/2014   CLINICAL DATA:  Shortness of breath, chest pain for 1 week  EXAM: CHEST  2 VIEW  COMPARISON:  07/12/2014  FINDINGS: The heart size and mediastinal contours are within normal limits. Both lungs are clear. The visualized skeletal structures are unremarkable.  IMPRESSION: No active cardiopulmonary disease.   Electronically Signed   By: Kathreen Devoid   On: 09/04/2014 13:38   Dg Shoulder Right  09/04/2014   CLINICAL DATA:  Acute posterior right shoulder pain for 1 week.  EXAM: RIGHT SHOULDER - 2+ VIEW  COMPARISON:  07/12/2014 chest x-ray  FINDINGS: Normal alignment without acute fracture. Mild AC joint degenerative change. Preserved glenohumeral joint space. No subluxation or dislocation. Axillary view is limited with overlying artifact.  IMPRESSION: No acute osseous finding or malalignment.   Electronically Signed   By: Daryll Brod M.D.   On: 09/04/2014 14:09     EKG Interpretation   Date/Time:  Thursday September 04 2014 12:01:35 EST Ventricular Rate:  79 PR Interval:  166 QRS Duration: 90 QT Interval:   390 QTC Calculation: 447 R Axis:   -38 Text Interpretation:  Normal sinus rhythm Left axis deviation Pulmonary  disease pattern Moderate voltage criteria for LVH, may be normal variant  Abnormal ECG Confirmed by Hazle Coca [phone]) on 09/04/2014 1:19:11 PM     EKG unchanged from prior.  MDM   Final diagnoses:  Right shoulder pain    Patient here with right shoulder pain, hypertensive in the emergency department but did not take her clonidine today. Clinical picture are not consistent with hypertensive urgency, ACS, dissection,  PE, CHF. Discussed with patient home care for her musculoskeletal shoulder pain as well as importance of medication compliance. Patient was recently admitted in September 2015 and had stress test done at that time. Discussed home care as well as close return precautions. Patient felt improved on recheck.    Quintella Reichert, MD 09/04/14 [place], MD 09/04/14 [phone]

## 2014-09-04 NOTE — ED Notes (Addendum)
Ptreports pain from right shoulder to back and into chest; started a week ago and worsening. Wearing monitor for unknown cardiac condition. Nausea reported. Denies diaphoresis and vomiting. L sided pressure 227/115 and R side 199/117

## 2014-09-18 ENCOUNTER — Ambulatory Visit (INDEPENDENT_AMBULATORY_CARE_PROVIDER_SITE_OTHER): Payer: Medicare Other | Admitting: Family Medicine

## 2014-09-18 ENCOUNTER — Encounter: Payer: Self-pay | Admitting: Family Medicine

## 2014-09-18 VITALS — BP 122/83 | HR 65 | Temp 97.8°F

## 2014-09-18 DIAGNOSIS — I200 Unstable angina: Secondary | ICD-10-CM | POA: Diagnosis not present

## 2014-09-18 DIAGNOSIS — IMO0002 Reserved for concepts with insufficient information to code with codable children: Secondary | ICD-10-CM

## 2014-09-18 DIAGNOSIS — I10 Essential (primary) hypertension: Secondary | ICD-10-CM | POA: Diagnosis not present

## 2014-09-18 DIAGNOSIS — M25511 Pain in right shoulder: Secondary | ICD-10-CM | POA: Diagnosis not present

## 2014-09-18 DIAGNOSIS — E1165 Type 2 diabetes mellitus with hyperglycemia: Secondary | ICD-10-CM | POA: Diagnosis not present

## 2014-09-18 DIAGNOSIS — M19011 Primary osteoarthritis, right shoulder: Secondary | ICD-10-CM

## 2014-09-18 MED ORDER — CYCLOBENZAPRINE HCL 5 MG PO TABS: 5.0000 mg | ORAL_TABLET | Freq: Three times a day (TID) | ORAL | Status: DC | PRN

## 2014-09-18 MED ORDER — TRAMADOL HCL 50 MG PO TABS: 50.0000 mg | ORAL_TABLET | Freq: Four times a day (QID) | ORAL | Status: DC | PRN

## 2014-09-18 MED ORDER — METFORMIN HCL ER 500 MG PO TB24: 500.0000 mg | ORAL_TABLET | Freq: Every day | ORAL | Status: DC

## 2014-09-18 MED ORDER — DICLOFENAC SODIUM 75 MG PO TBEC: 75.0000 mg | DELAYED_RELEASE_TABLET | Freq: Two times a day (BID) | ORAL | Status: DC

## 2014-09-18 MED ORDER — OXYCODONE-ACETAMINOPHEN 5-325 MG PO TABS: 1.0000 | ORAL_TABLET | Freq: Four times a day (QID) | ORAL | Status: DC | PRN

## 2014-09-18 NOTE — Progress Notes (Signed)
Subjective:    Patient ID: Heather Walker, female    DOB: Jan 06, 1961, 53 y.o.   MRN: [id number]  HPI Pt is here for f/u bp.  Pt c/o R shoulder pain that is worsening.   She was also dx with diabetes in the hospital --she was started on metformin 500 bid but it made her sick so she stopped it.  She was checking her bs qd.  rangeing 160-over 200.   Past Medical History  Diagnosis Date  . Hypertension   . Depression   . Asthma   . Arthritis   . Heart murmur    History   Social History  . Marital Status: Married    Spouse Name: N/A    Number of Children: N/A  . Years of Education: ged   Occupational History  . disabled    Social History Main Topics  . Smoking status: Never Smoker   . Smokeless tobacco: Never Used  . Alcohol Use: No  . Drug Use: No  . Sexual Activity:    Partners: Male    Birth Control/ Protection: Surgical   Other Topics Concern  . Not on file   Social History Narrative   Family History  Problem Relation Age of Onset  . Hypertension Mother   . Asthma Mother   . Hypertension Sister   . Asthma Sister   . Hypertension Maternal Grandmother   . Heart defect Sister   . Asthma Sister   . Aneurysm Sister   . Asthma Sister    Current Outpatient Prescriptions  Medication Sig Dispense Refill  . albuterol (PROVENTIL HFA;VENTOLIN HFA) 108 (90 BASE) MCG/ACT inhaler Inhale 2 puffs into the lungs every 6 (six) hours as needed. For shortness of breath. 1 Inhaler 4  . aspirin EC 81 MG EC tablet Take 1 tablet (81 mg total) by mouth daily.    . cloNIDine (CATAPRES) 0.2 MG tablet Take 1 tablet (0.2 mg total) by mouth 2 (two) times daily. 60 tablet 2  . fluconazole (DIFLUCAN) 150 MG tablet 1 po qd x1, may repeat in 3 days prn 2 tablet 0  . fluconazole (DIFLUCAN) 150 MG tablet 1 po qd x1, may repeat in 3 days prn 2 tablet 0  . glucose blood (BAYER CONTOUR NEXT TEST) test strip Use as instructed to test Blood Glucose daily Dx: E11.9 100 each 5  . Lancets MISC USE AS  DIRECTED TO TEST BLOOD GLUCOSE 100 each 5  . Naproxen Sodium (ALEVE) 220 MG CAPS Take 440 mg by mouth daily as needed (pain).    . ondansetron (ZOFRAN) 4 MG tablet Take 1 tablet (4 mg total) by mouth every 6 (six) hours. 12 tablet 0  . oxyCODONE-acetaminophen (PERCOCET/ROXICET) 5-325 MG per tablet Take 1 tablet by mouth every 6 (six) hours as needed for moderate pain or severe pain. 10 tablet 0  . pantoprazole (PROTONIX) 40 MG tablet Take 1 tablet (40 mg total) by mouth daily. 30 tablet 11  . potassium chloride 20 MEQ TBCR Take 20 mEq by mouth daily. 30 tablet 0  . traMADol (ULTRAM) 50 MG tablet Take 1 tablet (50 mg total) by mouth every 6 (six) hours as needed for moderate pain. 30 tablet 2  . triamterene-hydrochlorothiazide (MAXZIDE) 75-50 MG per tablet Take 1 tablet by mouth daily. 90 tablet 1  . diclofenac (VOLTAREN) 75 MG EC tablet Take 1 tablet (75 mg total) by mouth 2 (two) times daily. 60 tablet 2  . metFORMIN (GLUCOPHAGE XR) 500 MG 24  hr tablet Take 1 tablet (500 mg total) by mouth daily with breakfast. 30 tablet 2   No current facility-administered medications for this visit.     Review of Systems As above    Objective:   Physical Exam BP 122/83 mmHg  Pulse 65  Temp(Src) 97.8 F (36.6 C) (Oral)  SpO2 100% General appearance: alert, cooperative, appears stated age and no distress Throat: lips, mucosa, and tongue normal; teeth and gums normal Neck: no adenopathy, no carotid bruit, no JVD, supple, symmetrical, trachea midline and thyroid not enlarged, symmetric, no tenderness/mass/nodules Lungs: clear to auscultation bilaterally Heart: regular rate and rhythm, S1, S2 normal, no murmur, click, rub or gallop Extremities: extremities normal, atraumatic, no cyanosis or edema  Recent Results (from the past 2160 hour(s))  EKG 12-Lead     Status: Abnormal   Collection Time: 07/11/14  1:41 PM  Result Value Ref Range   EKG 12 lead    Basic metabolic panel     Status: Abnormal    Collection Time: 07/11/14  2:02 PM  Result Value Ref Range   Sodium 134 (L) 135 - 145 mEq/L   Potassium 2.8 (LL) 3.5 - 5.1 mEq/L   Chloride 95 (L) 96 - 112 mEq/L   CO2 30 19 - 32 mEq/L   Glucose, Bld 234 (H) 70 - 99 mg/dL   BUN 11 6 - 23 mg/dL   Creatinine, Ser 1.0 0.4 - 1.2 mg/dL   Calcium 9.5 8.4 - 10.5 mg/dL   GFR 79.54 >60.00 mL/min  Hepatic function panel     Status: Abnormal   Collection Time: 07/11/14  2:02 PM  Result Value Ref Range   Total Bilirubin 0.4 0.2 - 1.2 mg/dL   Bilirubin, Direct 0.0 0.0 - 0.3 mg/dL   Alkaline Phosphatase 97 39 - 117 U/L   AST 26 0 - 37 U/L   ALT 24 0 - 35 U/L   Total Protein 8.8 (H) 6.0 - 8.3 g/dL   Albumin 4.2 3.5 - 5.2 g/dL  Lipid panel     Status: None   Collection Time: 07/11/14  2:02 PM  Result Value Ref Range   Cholesterol 185 0 - 200 mg/dL    Comment: ATP III Classification       Desirable:  < 200 mg/dL               Borderline High:  200 - 239 mg/dL          High:  > = 240 mg/dL   Triglycerides 139.0 0.0 - 149.0 mg/dL    Comment: Normal:  <150 mg/dLBorderline High:  150 - 199 mg/dL   HDL 59.60 >39.00 mg/dL   VLDL 27.8 0.0 - 40.0 mg/dL   LDL Cholesterol 98 0 - 99 mg/dL   Total CHOL/HDL Ratio 3     Comment:                Men          Women1/2 Average Risk     3.4          3.3Average Risk          5.0          4.42X Average Risk          9.6          7.13X Average Risk          15.0          11.0  NonHDL 125.40     Comment: NOTE:  Non-HDL goal should be 30 mg/dL higher than patient's LDL goal (i.e. LDL goal of < 70 mg/dL, would have non-HDL goal of < 100 mg/dL)  Microalbumin / creatinine urine ratio     Status: Abnormal   Collection Time: 07/11/14  2:02 PM  Result Value Ref Range   Microalb, Ur 17.8 (H) 0.0 - 1.9 mg/dL   Creatinine,U 101.4 mg/dL   Microalb Creat Ratio 17.6 0.0 - 30.0 mg/g  POCT urinalysis dipstick     Status: None   Collection Time: 07/11/14  2:30 PM  Result Value Ref Range   Color, UA yellow      Clarity, UA clear    Glucose, UA 1    Bilirubin, UA neg    Ketones, UA neg    Spec Grav, UA 1.020    Blood, UA neg    pH, UA 6.5    Protein, UA 1    Urobilinogen, UA 0.2    Nitrite, UA neg    Leukocytes, UA Negative   CBC     Status: None   Collection Time: 07/12/14  8:13 PM  Result Value Ref Range   WBC 5.8 4.0 - 10.5 K/uL   RBC 4.79 3.87 - 5.11 MIL/uL   Hemoglobin 13.8 12.0 - 15.0 g/dL   HCT 39.8 36.0 - 46.0 %   MCV 83.1 78.0 - 100.0 fL   MCH 28.8 26.0 - 34.0 pg   MCHC 34.7 30.0 - 36.0 g/dL   RDW 13.9 11.5 - 15.5 %   Platelets 294 150 - 400 K/uL  Basic metabolic panel     Status: Abnormal   Collection Time: 07/12/14  8:13 PM  Result Value Ref Range   Sodium 139 137 - 147 mEq/L   Potassium 3.2 (L) 3.7 - 5.3 mEq/L   Chloride 94 (L) 96 - 112 mEq/L   CO2 28 19 - 32 mEq/L   Glucose, Bld 350 (H) 70 - 99 mg/dL   BUN 13 6 - 23 mg/dL   Creatinine, Ser 1.18 (H) 0.50 - 1.10 mg/dL   Calcium 9.6 8.4 - 10.5 mg/dL   GFR calc non Af Amer 52 (L) >90 mL/min   GFR calc Af Amer 61 (L) >90 mL/min    Comment: (NOTE) The eGFR has been calculated using the CKD EPI equation. This calculation has not been validated in all clinical situations. eGFR's persistently <90 mL/min signify possible Chronic Kidney Disease.   Anion gap 17 (H) 5 - 15  I-stat troponin, ED (not at [facility])     Status: None   Collection Time: 07/12/14  8:15 PM  Result Value Ref Range   Troponin i, poc 0.01 0.00 - 0.08 ng/mL   Comment 3            Comment: Due to the release kinetics of cTnI, a negative result within the Walker hours of the onset of symptoms does not rule out myocardial infarction with certainty. If myocardial infarction is still suspected, repeat the test at appropriate intervals.  D-dimer, quantitative     Status: None   Collection Time: 07/12/14  8:15 PM  Result Value Ref Range   D-Dimer, Quant <0.27 0.00 - 0.48 ug/mL-FEU    Comment:        AT THE INHOUSE ESTABLISHED CUTOFF VALUE OF 0.48 ug/mL  FEU, THIS ASSAY HAS BEEN DOCUMENTED IN THE LITERATURE TO HAVE A SENSITIVITY AND NEGATIVE PREDICTIVE VALUE OF AT LEAST 98 TO 99%.  THE TEST RESULT SHOULD BE CORRELATED WITH AN ASSESSMENT OF THE CLINICAL PROBABILITY OF DVT / VTE.  I-stat chem 8, ed     Status: Abnormal   Collection Time: 07/12/14  8:47 PM  Result Value Ref Range   Sodium 140 137 - 147 mEq/L   Potassium 3.1 (L) 3.7 - 5.3 mEq/L   Chloride 98 96 - 112 mEq/L   BUN 12 6 - 23 mg/dL   Creatinine, Ser 1.20 (H) 0.50 - 1.10 mg/dL   Glucose, Bld 359 (H) 70 - 99 mg/dL   Calcium, Ion 1.13 1.12 - 1.23 mmol/L   TCO2 34 0 - 100 mmol/L   Hemoglobin 15.6 (H) 12.0 - 15.0 g/dL   HCT 46.0 36.0 - 46.0 %  Troponin I-serum (0, 3, 6 hours)     Status: None   Collection Time: 07/12/14 11:14 PM  Result Value Ref Range   Troponin I <0.30 <0.30 ng/mL    Comment:        Due to the release kinetics of cTnI, a negative result within the Walker hours of the onset of symptoms does not rule out myocardial infarction with certainty. If myocardial infarction is still suspected, repeat the test at appropriate intervals.  CBC     Status: None   Collection Time: 07/12/14 11:16 PM  Result Value Ref Range   WBC 6.0 4.0 - 10.5 K/uL   RBC 4.60 3.87 - 5.11 MIL/uL   Hemoglobin 13.1 12.0 - 15.0 g/dL   HCT 38.2 36.0 - 46.0 %   MCV 83.0 78.0 - 100.0 fL   MCH 28.5 26.0 - 34.0 pg   MCHC 34.3 30.0 - 36.0 g/dL   RDW 14.0 11.5 - 15.5 %   Platelets 282 150 - 400 K/uL  Creatinine, serum     Status: Abnormal   Collection Time: 07/12/14 11:16 PM  Result Value Ref Range   Creatinine, Ser 1.18 (H) 0.50 - 1.10 mg/dL   GFR calc non Af Amer 52 (L) >90 mL/min   GFR calc Af Amer 61 (L) >90 mL/min    Comment: (NOTE) The eGFR has been calculated using the CKD EPI equation. This calculation has not been validated in all clinical situations. eGFR's persistently <90 mL/min signify possible Chronic Kidney Disease.  Glucose, capillary     Status: Abnormal    Collection Time: 07/12/14 11:52 PM  Result Value Ref Range   Glucose-Capillary 256 (H) 70 - 99 mg/dL  Troponin I-serum (0, 3, 6 hours)     Status: None   Collection Time: 07/13/14  1:46 AM  Result Value Ref Range   Troponin I <0.30 <0.30 ng/mL    Comment:        Due to the release kinetics of cTnI, a negative result within the Walker hours of the onset of symptoms does not rule out myocardial infarction with certainty. If myocardial infarction is still suspected, repeat the test at appropriate intervals.  Troponin I-serum (0, 3, 6 hours)     Status: None   Collection Time: 07/13/14  5:23 AM  Result Value Ref Range   Troponin I <0.30 <0.30 ng/mL    Comment:        Due to the release kinetics of cTnI, a negative result within the Walker hours of the onset of symptoms does not rule out myocardial infarction with certainty. If myocardial infarction is still suspected, repeat the test at appropriate intervals.  Basic metabolic panel     Status: Abnormal   Collection Time: 07/13/14  5:23 AM  Result Value Ref Range   Sodium 138 137 - 147 mEq/L   Potassium 3.7 3.7 - 5.3 mEq/L   Chloride 97 96 - 112 mEq/L   CO2 27 19 - 32 mEq/L   Glucose, Bld 218 (H) 70 - 99 mg/dL   BUN 14 6 - 23 mg/dL   Creatinine, Ser 0.96 0.50 - 1.10 mg/dL   Calcium 8.5 8.4 - 10.5 mg/dL   GFR calc non Af Amer 67 (L) >90 mL/min   GFR calc Af Amer 78 (L) >90 mL/min    Comment: (NOTE) The eGFR has been calculated using the CKD EPI equation. This calculation has not been validated in all clinical situations. eGFR's persistently <90 mL/min signify possible Chronic Kidney Disease.   Anion gap 14 5 - 15  CBC     Status: Abnormal   Collection Time: 07/13/14  5:23 AM  Result Value Ref Range   WBC 4.8 4.0 - 10.5 K/uL   RBC 4.31 3.87 - 5.11 MIL/uL   Hemoglobin 12.2 12.0 - 15.0 g/dL   HCT 35.9 (L) 36.0 - 46.0 %   MCV 83.3 78.0 - 100.0 fL   MCH 28.3 26.0 - 34.0 pg   MCHC 34.0 30.0 - 36.0 g/dL   RDW 13.9 11.5 - 15.5 %    Platelets 240 150 - 400 K/uL  Hemoglobin A1c     Status: Abnormal   Collection Time: 07/13/14  5:23 AM  Result Value Ref Range   Hgb A1c MFr Bld 9.4 (H) <5.7 %    Comment: (NOTE)                                                                       According to the ADA Clinical Practice Recommendations for 2011, when HbA1c is used as a screening test:  >=6.5%   Diagnostic of Diabetes Mellitus           (if abnormal result is confirmed) 5.7-6.4%   Increased risk of developing Diabetes Mellitus References:Diagnosis and Classification of Diabetes Mellitus,Diabetes JKDT,2671,24(PYKDX 1):S62-S69 and Standards of Medical Care in         Diabetes - 2011,Diabetes Care,2011,34 (Suppl 1):S11-S61.   Mean Plasma Glucose 223 (H) <117 mg/dL    Comment: Performed at [facility]  Magnesium     Status: None   Collection Time: 07/13/14  5:23 AM  Result Value Ref Range   Magnesium 1.7 1.5 - 2.5 mg/dL  Lipid panel     Status: Abnormal   Collection Time: 07/13/14  5:23 AM  Result Value Ref Range   Cholesterol 149 0 - 200 mg/dL   Triglycerides 218 (H) <150 mg/dL   HDL 43 >39 mg/dL   Total CHOL/HDL Ratio 3.5 RATIO   VLDL 44 (H) 0 - 40 mg/dL   LDL Cholesterol 62 0 - 99 mg/dL    Comment:        Total Cholesterol/HDL:CHD Risk Coronary Heart Disease Risk Table                     Men   Women  1/2 Average Risk   3.4   3.3  Average Risk       5.0   4.4  2 X  Average Risk   9.6   7.1  3 X Average Risk  23.4   11.0        Use the calculated Patient Ratio above and the CHD Risk Table to determine the patient's CHD Risk.        ATP III CLASSIFICATION (LDL):  <100     mg/dL   Optimal  100-129  mg/dL   Near or Above                    Optimal  130-159  mg/dL   Borderline  160-189  mg/dL   High  >190     mg/dL   Very High  Glucose, capillary     Status: Abnormal   Collection Time: 07/13/14  8:01 AM  Result Value Ref Range   Glucose-Capillary 203 (H) 70 - 99 mg/dL  Glucose, capillary      Status: Abnormal   Collection Time: 07/13/14 11:34 AM  Result Value Ref Range   Glucose-Capillary 233 (H) 70 - 99 mg/dL  TSH     Status: None   Collection Time: 07/13/14  1:45 PM  Result Value Ref Range   TSH 1.360 0.350 - 4.500 uIU/mL  Aldosterone + renin activity w/ ratio     Status: None   Collection Time: 07/13/14  4:15 PM  Result Value Ref Range   PRA LC/MS/MS 2.04 0.25 - 5.82 ng/mL/h   ALDO / PRA Ratio 8.3 0.9 - 28.9 Ratio   Aldosterone 17 ng/dL    Comment: (NOTE)   Adult Reference Ranges for Aldosterone,   LC/MS/MS:    Upright 8:00-10:00 am    < or = 28 ng/dL    Upright 4:00-6:00 pm     < or = 21 ng/dL    Supine  8:00-10:00 am    3-16 ng/dL Performed at [facility]  Glucose, capillary     Status: Abnormal   Collection Time: 07/13/14  4:51 PM  Result Value Ref Range   Glucose-Capillary 136 (H) 70 - 99 mg/dL  Glucose, capillary     Status: Abnormal   Collection Time: 07/13/14  8:26 PM  Result Value Ref Range   Glucose-Capillary 233 (H) 70 - 99 mg/dL  Glucose, capillary     Status: Abnormal   Collection Time: 07/13/14 11:56 PM  Result Value Ref Range   Glucose-Capillary 210 (H) 70 - 99 mg/dL  Glucose, capillary     Status: Abnormal   Collection Time: 07/14/14  4:15 AM  Result Value Ref Range   Glucose-Capillary 210 (H) 70 - 99 mg/dL  Basic metabolic panel     Status: Abnormal   Collection Time: 07/14/14  4:28 AM  Result Value Ref Range   Sodium 137 137 - 147 mEq/L   Potassium 4.3 3.7 - 5.3 mEq/L   Chloride 100 96 - 112 mEq/L   CO2 26 19 - 32 mEq/L   Glucose, Bld 201 (H) 70 - 99 mg/dL   BUN 12 6 - 23 mg/dL   Creatinine, Ser 0.89 0.50 - 1.10 mg/dL   Calcium 8.5 8.4 - 10.5 mg/dL   GFR calc non Af Amer 74 (L) >90 mL/min   GFR calc Af Amer 86 (L) >90 mL/min    Comment: (NOTE) The eGFR has been calculated using the CKD EPI equation. This calculation has not been validated in all clinical situations. eGFR's persistently <90 mL/min signify possible Chronic  Kidney Disease.   Anion gap 11 5 - 15  CBC  Status: Abnormal   Collection Time: 07/14/14  4:28 AM  Result Value Ref Range   WBC 4.0 4.0 - 10.5 K/uL   RBC 3.99 3.87 - 5.11 MIL/uL   Hemoglobin 11.2 (L) 12.0 - 15.0 g/dL   HCT 33.4 (L) 36.0 - 46.0 %   MCV 83.7 78.0 - 100.0 fL   MCH 28.1 26.0 - 34.0 pg   MCHC 33.5 30.0 - 36.0 g/dL   RDW 13.9 11.5 - 15.5 %   Platelets 214 150 - 400 K/uL  Magnesium     Status: None   Collection Time: 07/14/14  4:28 AM  Result Value Ref Range   Magnesium 2.2 1.5 - 2.5 mg/dL  Glucose, capillary     Status: Abnormal   Collection Time: 07/14/14  7:33 AM  Result Value Ref Range   Glucose-Capillary 209 (H) 70 - 99 mg/dL  Glucose, capillary     Status: Abnormal   Collection Time: 07/14/14 11:26 AM  Result Value Ref Range   Glucose-Capillary 251 (H) 70 - 99 mg/dL  Glucose, capillary     Status: Abnormal   Collection Time: 07/14/14  4:42 PM  Result Value Ref Range   Glucose-Capillary 189 (H) 70 - 99 mg/dL  Glucose, capillary     Status: Abnormal   Collection Time: 07/14/14  9:05 PM  Result Value Ref Range   Glucose-Capillary 253 (H) 70 - 99 mg/dL  Basic metabolic panel     Status: Abnormal   Collection Time: 07/15/14  5:52 AM  Result Value Ref Range   Sodium 138 137 - 147 mEq/L   Potassium 3.6 (L) 3.7 - 5.3 mEq/L   Chloride 97 96 - 112 mEq/L   CO2 28 19 - 32 mEq/L   Glucose, Bld 213 (H) 70 - 99 mg/dL   BUN 13 6 - 23 mg/dL   Creatinine, Ser 0.89 0.50 - 1.10 mg/dL   Calcium 8.6 8.4 - 10.5 mg/dL   GFR calc non Af Amer 74 (L) >90 mL/min   GFR calc Af Amer 86 (L) >90 mL/min    Comment: (NOTE) The eGFR has been calculated using the CKD EPI equation. This calculation has not been validated in all clinical situations. eGFR's persistently <90 mL/min signify possible Chronic Kidney Disease.   Anion gap 13 5 - 15  Glucose, capillary     Status: Abnormal   Collection Time: 07/15/14  7:40 AM  Result Value Ref Range   Glucose-Capillary 190 (H) 70 - 99  mg/dL   Comment 1 Documented in Chart    Comment 2 Notify RN   Glucose, capillary     Status: Abnormal   Collection Time: 07/15/14 11:24 AM  Result Value Ref Range   Glucose-Capillary 206 (H) 70 - 99 mg/dL   Comment 1 Documented in Chart    Comment 2 Notify RN   Basic Metabolic Panel (BMET)     Status: Abnormal   Collection Time: 07/25/14 12:30 PM  Result Value Ref Range   Sodium 136 135 - 145 mEq/L   Potassium 3.4 (L) 3.5 - 5.1 mEq/L   Chloride 102 96 - 112 mEq/L   CO2 27 19 - 32 mEq/L   Glucose, Bld 158 (H) 70 - 99 mg/dL   BUN 10 6 - 23 mg/dL   Creatinine, Ser 0.8 0.4 - 1.2 mg/dL   Calcium 8.8 8.4 - 10.5 mg/dL   GFR 91.66 >60.00 mL/min  Urine Microalbumin w/creat. ratio     Status: None   Collection Time: 07/25/14  12:30 PM  Result Value Ref Range   Microalb, Ur 1.0 0.0 - 1.9 mg/dL   Creatinine,U 136.2 mg/dL   Microalb Creat Ratio 0.7 0.0 - 30.0 mg/g  Cervicovaginal ancillary only     Status: None   Collection Time: 08/15/14 12:00 AM  Result Value Ref Range   BD affirm Candida: Negative     Comment: Normal Reference Range - Negative   BD affirm Gardnerella: Negative     Comment: Normal Reference Range - Negative   BD affirm Trichomonas: Negative     Comment: Normal Reference Range - Negative  POCT Urinalysis Dipstick     Status: Normal   Collection Time: 08/15/14  2:56 PM  Result Value Ref Range   Color, UA Dark Yellow    Clarity, UA Cloudy    Glucose, UA Neg    Bilirubin, UA Neg    Ketones, UA Neg    Spec Grav, UA 1.015    Blood, UA Neg    pH, UA 8.0    Protein, UA Neg    Urobilinogen, UA 4.0    Nitrite, UA Neg    Leukocytes, UA Negative   Comprehensive metabolic panel     Status: Abnormal   Collection Time: 09/04/14 12:12 PM  Result Value Ref Range   Sodium 143 137 - 147 mEq/L   Potassium 3.8 3.7 - 5.3 mEq/L   Chloride 101 96 - 112 mEq/L   CO2 29 19 - 32 mEq/L   Glucose, Bld 195 (H) 70 - 99 mg/dL   BUN 7 6 - 23 mg/dL   Creatinine, Ser 0.65 0.50 - 1.10  mg/dL   Calcium 8.7 8.4 - 10.5 mg/dL   Total Protein 7.9 6.0 - 8.3 g/dL   Albumin 3.8 3.5 - 5.2 g/dL   AST 19 0 - 37 U/L   ALT 19 0 - 35 U/L   Alkaline Phosphatase 90 39 - 117 U/L   Total Bilirubin 0.3 0.3 - 1.2 mg/dL   GFR calc non Af Amer >90 >90 mL/min   GFR calc Af Amer >90 >90 mL/min    Comment: (NOTE) The eGFR has been calculated using the CKD EPI equation. This calculation has not been validated in all clinical situations. eGFR's persistently <90 mL/min signify possible Chronic Kidney Disease.    Anion gap 13 5 - 15  CBC with Differential     Status: Abnormal   Collection Time: 09/04/14 12:12 PM  Result Value Ref Range   WBC 4.1 4.0 - 10.5 K/uL   RBC 4.51 3.87 - 5.11 MIL/uL   Hemoglobin 12.6 12.0 - 15.0 g/dL   HCT 38.0 36.0 - 46.0 %   MCV 84.3 78.0 - 100.0 fL   MCH 27.9 26.0 - 34.0 pg   MCHC 33.2 30.0 - 36.0 g/dL   RDW 14.1 11.5 - 15.5 %   Platelets 245 150 - 400 K/uL   Neutrophils Relative % 39 (L) 43 - 77 %   Neutro Abs 1.6 (L) 1.7 - 7.7 K/uL   Lymphocytes Relative 48 (H) 12 - 46 %   Lymphs Abs 1.9 0.7 - 4.0 K/uL   Monocytes Relative 10 3 - 12 %   Monocytes Absolute 0.4 0.1 - 1.0 K/uL   Eosinophils Relative 3 0 - 5 %   Eosinophils Absolute 0.1 0.0 - 0.7 K/uL   Basophils Relative 0 0 - 1 %   Basophils Absolute 0.0 0.0 - 0.1 K/uL  I-Stat Troponin, ED (not at [facility])  Status: None   Collection Time: 09/04/14 12:25 PM  Result Value Ref Range   Troponin i, poc 0.00 0.00 - 0.08 ng/mL   Comment 3            Comment: Due to the release kinetics of cTnI, a negative result within the Walker hours of the onset of symptoms does not rule out myocardial infarction with certainty. If myocardial infarction is still suspected, repeat the test at appropriate intervals.         Assessment & Plan:  1. Shoulder pain, right Chronic-- diclofenac until she sees ortho - Ambulatory referral to Orthopedic Surgery - cyclobenzaprine (FLEXERIL) 5 MG tablet; Take 1 tablet (5 mg  total) by mouth 3 (three) times daily as needed for muscle spasms.  Dispense: 30 tablet; Refill: 1  2. Primary osteoarthritis of right shoulder   - diclofenac (VOLTAREN) 75 MG EC tablet; Take 1 tablet (75 mg total) by mouth 2 (two) times daily.  Dispense: 60 tablet; Refill: 2 - traMADol (ULTRAM) 50 MG tablet; Take 1 tablet (50 mg total) by mouth every 6 (six) hours as needed for moderate pain.  Dispense: 30 tablet; Refill: 2 - oxyCODONE-acetaminophen (PERCOCET/ROXICET) 5-325 MG per tablet; Take 1 tablet by mouth every 6 (six) hours as needed for moderate pain or severe pain.  Dispense: 10 tablet; Refill: 0  3. Diabetes type 2, uncontrolled Will try Xr metformin ---secondary to cost and pt will call if she has a problem. DM ed given to pt and referral for dm educator - metFORMIN (GLUCOPHAGE XR) 500 MG 24 hr tablet; Take 1 tablet (500 mg total) by mouth daily with breakfast.  Dispense: 30 tablet; Refill: 2 - Ambulatory referral to diabetic education  4. Essential hypertension Stable, con't meds

## 2014-09-18 NOTE — Patient Instructions (Signed)
Diabetes and Standards of Medical Care Diabetes is complicated. You may find that your diabetes team includes a dietitian, nurse, diabetes educator, eye doctor, and more. To help everyone know what is going on and to help you get the care you deserve, the following schedule of care was developed to help keep you on track. Below are the tests, exams, vaccines, medicines, education, and plans you will need. HbA1c test This test shows how well you have controlled your glucose over the past 2-3 months. It is used to see if your diabetes management plan needs to be adjusted.   It is performed at least 2 times a year if you are meeting treatment goals.  It is performed 4 times a year if therapy has changed or if you are not meeting treatment goals. Blood pressure test  This test is performed at every routine medical visit. The goal is less than 140/90 mm Hg for most people, but 130/80 mm Hg in some cases. Ask your health care provider about your goal. Dental exam  Follow up with the dentist regularly. Eye exam  If you are diagnosed with type 1 diabetes as a child, get an exam upon reaching the age of 37 years or older and have had diabetes for 3-5 years. Yearly eye exams are recommended after that initial eye exam.  If you are diagnosed with type 1 diabetes as an adult, get an exam within 5 years of diagnosis and then yearly.  If you are diagnosed with type 2 diabetes, get an exam as soon as possible after the diagnosis and then yearly. Foot care exam  Visual foot exams are performed at every routine medical visit. The exams check for cuts, injuries, or other problems with the feet.  A comprehensive foot exam should be done yearly. This includes visual inspection as well as assessing foot pulses and testing for loss of sensation.  Check your feet nightly for cuts, injuries, or other problems with your feet. Tell your health care provider if anything is not healing. Kidney function test (urine  microalbumin)  This test is performed once a year.  Type 1 diabetes: The first test is performed 5 years after diagnosis.  Type 2 diabetes: The first test is performed at the time of diagnosis.  A serum creatinine and estimated glomerular filtration rate (eGFR) test is done once a year to assess the level of chronic kidney disease (CKD), if present. Lipid profile (cholesterol, HDL, LDL, triglycerides)  Performed every 5 years for most people.  The goal for LDL is less than 100 mg/dL. If you are at high risk, the goal is less than 70 mg/dL.  The goal for HDL is 40 mg/dL-50 mg/dL for men and 50 mg/dL-60 mg/dL for women. An HDL cholesterol of 60 mg/dL or higher gives some protection against heart disease.  The goal for triglycerides is less than 150 mg/dL. Influenza vaccine, pneumococcal vaccine, and hepatitis B vaccine  The influenza vaccine is recommended yearly.  It is recommended that people with diabetes who are over 24 years old get the pneumonia vaccine. In some cases, two separate shots may be given. Ask your health care provider if your pneumonia vaccination is up to date.  The hepatitis B vaccine is also recommended for adults with diabetes. Diabetes self-management education  Education is recommended at diagnosis and ongoing as needed. Treatment plan  Your treatment plan is reviewed at every medical visit. Document Released: 07/31/2009 Document Revised: 02/17/2014 Document Reviewed: 03/05/2013 [facility] Patient Information 2015 [place],  [employer]. This information is not intended to replace advice given to you by your health care provider. Make sure you discuss any questions you have with your health care provider.  

## 2014-09-18 NOTE — Progress Notes (Signed)
Pre visit review using our clinic review tool, if applicable. No additional management support is needed unless otherwise documented below in the visit note. 

## 2014-09-26 ENCOUNTER — Ambulatory Visit: Payer: Medicare Other | Admitting: Cardiology

## 2014-09-30 ENCOUNTER — Telehealth: Payer: Self-pay | Admitting: Cardiology

## 2014-09-30 NOTE — Telephone Encounter (Signed)
Patient informed of monitor results and verbal understanding expressed.   

## 2014-09-30 NOTE — Telephone Encounter (Signed)
Please let patient know that heart monitor showed NSR with no arrhythmias 

## 2014-10-03 ENCOUNTER — Encounter: Payer: Self-pay | Admitting: Family Medicine

## 2014-10-03 ENCOUNTER — Ambulatory Visit (INDEPENDENT_AMBULATORY_CARE_PROVIDER_SITE_OTHER): Payer: Medicare Other | Admitting: Family Medicine

## 2014-10-03 VITALS — BP 140/84 | HR 71 | Temp 98.7°F | Ht 63.0 in | Wt 220.0 lb

## 2014-10-03 DIAGNOSIS — I200 Unstable angina: Secondary | ICD-10-CM

## 2014-10-03 DIAGNOSIS — G4700 Insomnia, unspecified: Secondary | ICD-10-CM | POA: Diagnosis not present

## 2014-10-03 DIAGNOSIS — I10 Essential (primary) hypertension: Secondary | ICD-10-CM | POA: Diagnosis not present

## 2014-10-03 MED ORDER — TRAZODONE HCL 50 MG PO TABS: 25.0000 mg | ORAL_TABLET | Freq: Every evening | ORAL | Status: DC | PRN

## 2014-10-03 NOTE — Progress Notes (Signed)
Pre visit review using our clinic review tool, if applicable. No additional management support is needed unless otherwise documented below in the visit note. 

## 2014-10-03 NOTE — Patient Instructions (Signed)
Insomnia Insomnia is frequent trouble falling and/or staying asleep. Insomnia can be a long term problem or a short term problem. Both are common. Insomnia can be a short term problem when the wakefulness is related to a certain stress or worry. Long term insomnia is often related to ongoing stress during waking hours and/or poor sleeping habits. Overtime, sleep deprivation itself can make the problem worse. Every little thing feels more severe because you are overtired and your ability to cope is decreased. CAUSES   Stress, anxiety, and depression.  Poor sleeping habits.  Distractions such as TV in the bedroom.  Naps close to bedtime.  Engaging in emotionally charged conversations before bed.  Technical reading before sleep.  Alcohol and other sedatives. They may make the problem worse. They can hurt normal sleep patterns and normal dream activity.  Stimulants such as caffeine for several hours prior to bedtime.  Pain syndromes and shortness of breath can cause insomnia.  Exercise late at night.  Changing time zones may cause sleeping problems (jet lag). It is sometimes helpful to have someone observe your sleeping patterns. They should look for periods of not breathing during the night (sleep apnea). They should also look to see how long those periods last. If you live alone or observers are uncertain, you can also be observed at a sleep clinic where your sleep patterns will be professionally monitored. Sleep apnea requires a checkup and treatment. Give your caregivers your medical history. Give your caregivers observations your family has made about your sleep.  SYMPTOMS   Not feeling rested in the morning.  Anxiety and restlessness at bedtime.  Difficulty falling and staying asleep. TREATMENT   Your caregiver may prescribe treatment for an underlying medical disorders. Your caregiver can give advice or help if you are using alcohol or other drugs for self-medication. Treatment  of underlying problems will usually eliminate insomnia problems.  Medications can be prescribed for short time use. They are generally not recommended for lengthy use.  Over-the-counter sleep medicines are not recommended for lengthy use. They can be habit forming.  You can promote easier sleeping by making lifestyle changes such as:  Using relaxation techniques that help with breathing and reduce muscle tension.  Exercising earlier in the day.  Changing your diet and the time of your last meal. No night time snacks.  Establish a regular time to go to bed.  Counseling can help with stressful problems and worry.  Soothing music and white noise may be helpful if there are background noises you cannot remove.  Stop tedious detailed work at least one hour before bedtime. HOME CARE INSTRUCTIONS   Keep a diary. Inform your caregiver about your progress. This includes any medication side effects. See your caregiver regularly. Take note of:  Times when you are asleep.  Times when you are awake during the night.  The quality of your sleep.  How you feel the next day. This information will help your caregiver care for you.  Get out of bed if you are still awake after 15 minutes. Read or do some quiet activity. Keep the lights down. Wait until you feel sleepy and go back to bed.  Keep regular sleeping and waking hours. Avoid naps.  Exercise regularly.  Avoid distractions at bedtime. Distractions include watching television or engaging in any intense or detailed activity like attempting to balance the household checkbook.  Develop a bedtime ritual. Keep a familiar routine of bathing, brushing your teeth, climbing into bed at the same   time each night, listening to soothing music. Routines increase the success of falling to sleep faster.  Use relaxation techniques. This can be using breathing and muscle tension release routines. It can also include visualizing peaceful scenes. You can  also help control troubling or intruding thoughts by keeping your mind occupied with boring or repetitive thoughts like the old concept of counting sheep. You can make it more creative like imagining planting one beautiful flower after another in your backyard garden.  During your day, work to eliminate stress. When this is not possible use some of the previous suggestions to help reduce the anxiety that accompanies stressful situations. MAKE SURE YOU:   Understand these instructions.  Will watch your condition.  Will get help right away if you are not doing well or get worse. Document Released: 09/30/2000 Document Revised: 12/26/2011 Document Reviewed: 10/31/2007 [employer] Patient Information 2015 [employer]. This information is not intended to replace advice given to you by your health care provider. Make sure you discuss any questions you have with your health care provider.  

## 2014-10-05 ENCOUNTER — Encounter: Payer: Self-pay | Admitting: Family Medicine

## 2014-10-05 NOTE — Progress Notes (Signed)
Subjective:    Patient here for follow-up of elevated blood pressure.  She is not exercising and is adherent to a low-salt diet.  Blood pressure is well controlled at home. Cardiac symptoms: fatigue. Patient denies: chest pain, chest pressure/discomfort, claudication, dyspnea, exertional chest pressure/discomfort, irregular heart beat, lower extremity edema, near-syncope, orthopnea, palpitations, paroxysmal nocturnal dyspnea, syncope and tachypnea. Cardiovascular risk factors: diabetes mellitus, dyslipidemia, hypertension, obesity (BMI >= 30 kg/m2) and sedentary lifestyle. Use of agents associated with hypertension: none. History of target organ damage: none  She also c/o insomnia --- she can not shut off her brain to go to sleep--- she is not restless and does not snore.  The following portions of the patient's history were reviewed and updated as appropriate:  She  has a past medical history of Hypertension; Depression; Asthma; Arthritis; Heart murmur; and Diabetes mellitus without complication. She  does not have any pertinent problems on file. She  has past surgical history that includes Abdominal hysterectomy; Bladder suspension; Appendectomy; Cesarean section; and Tubal ligation. Her family history includes Aneurysm in her sister; Asthma in her mother, sister, sister, and sister; Heart defect in her sister; Hypertension in her maternal grandmother, mother, and sister. She  reports that she has never smoked. She has never used smokeless tobacco. She reports that she does not drink alcohol or use illicit drugs. She has a current medication list which includes the following prescription(s): albuterol, aspirin, clonidine, cyclobenzaprine, diclofenac, fluconazole, fluconazole, glucose blood, lancets, metformin, naproxen sodium, ondansetron, oxycodone-acetaminophen, pantoprazole, potassium chloride er, tramadol, trazodone, and triamterene-hydrochlorothiazide. Current Outpatient Prescriptions on File  Prior to Visit  Medication Sig Dispense Refill  . albuterol (PROVENTIL HFA;VENTOLIN HFA) 108 (90 BASE) MCG/ACT inhaler Inhale 2 puffs into the lungs every 6 (six) hours as needed. For shortness of breath. 1 Inhaler 4  . aspirin EC 81 MG EC tablet Take 1 tablet (81 mg total) by mouth daily.    . cloNIDine (CATAPRES) 0.2 MG tablet Take 1 tablet (0.2 mg total) by mouth 2 (two) times daily. 60 tablet 2  . cyclobenzaprine (FLEXERIL) 5 MG tablet Take 1 tablet (5 mg total) by mouth 3 (three) times daily as needed for muscle spasms. 30 tablet 1  . diclofenac (VOLTAREN) 75 MG EC tablet Take 1 tablet (75 mg total) by mouth 2 (two) times daily. 60 tablet 2  . fluconazole (DIFLUCAN) 150 MG tablet 1 po qd x1, may repeat in 3 days prn 2 tablet 0  . fluconazole (DIFLUCAN) 150 MG tablet 1 po qd x1, may repeat in 3 days prn 2 tablet 0  . glucose blood (BAYER CONTOUR NEXT TEST) test strip Use as instructed to test Blood Glucose daily Dx: E11.9 100 each 5  . Lancets MISC USE AS DIRECTED TO TEST BLOOD GLUCOSE 100 each 5  . metFORMIN (GLUCOPHAGE XR) 500 MG 24 hr tablet Take 1 tablet (500 mg total) by mouth daily with breakfast. 30 tablet 2  . Naproxen Sodium (ALEVE) 220 MG CAPS Take 440 mg by mouth daily as needed (pain).    . ondansetron (ZOFRAN) 4 MG tablet Take 1 tablet (4 mg total) by mouth every 6 (six) hours. 12 tablet 0  . oxyCODONE-acetaminophen (PERCOCET/ROXICET) 5-325 MG per tablet Take 1 tablet by mouth every 6 (six) hours as needed for moderate pain or severe pain. 10 tablet 0  . pantoprazole (PROTONIX) 40 MG tablet Take 1 tablet (40 mg total) by mouth daily. 30 tablet 11  . potassium chloride 20 MEQ TBCR Take 20 mEq by  mouth daily. 30 tablet 0  . traMADol (ULTRAM) 50 MG tablet Take 1 tablet (50 mg total) by mouth every 6 (six) hours as needed for moderate pain. 30 tablet 2  . triamterene-hydrochlorothiazide (MAXZIDE) 75-50 MG per tablet Take 1 tablet by mouth daily. 90 tablet 1   No current  facility-administered medications on file prior to visit.   She is allergic to codeine..  Review of Systems Pertinent items are noted in HPI.     Objective:    BP 140/84 mmHg  Pulse 71  Temp(Src) 98.7 F (37.1 C) (Oral)  Ht 5\' 3"  (1.6 m)  Wt 220 lb (99.791 kg)  BMI 38.98 kg/m2  SpO2 96% General appearance: alert, cooperative and appears stated age Throat: lips, mucosa, and tongue normal; teeth and gums normal Neck: no adenopathy, no carotid bruit, no JVD, supple, symmetrical, trachea midline and thyroid not enlarged, symmetric, no tenderness/mass/nodules Lungs: clear to auscultation bilaterally Heart: S1, S2 normal Extremities: extremities normal, atraumatic, no cyanosis or edema    Assessment:    Hypertension, normal blood pressure . Evidence of target organ damage: none.    Plan:    1. Insomnia  - traZODone (DESYREL) 50 MG tablet; Take 0.5-1 tablets (25-50 mg total) by mouth at bedtime as needed for sleep.  Dispense: 30 tablet; Refill: 3  2. Essential hypertension Check labs, con't meds, stable - Basic metabolic panel; Future - Hepatic function panel; Future - Lipid panel; Future - POCT urinalysis dipstick; Future

## 2014-10-13 ENCOUNTER — Telehealth: Payer: Self-pay

## 2014-10-13 ENCOUNTER — Other Ambulatory Visit (INDEPENDENT_AMBULATORY_CARE_PROVIDER_SITE_OTHER): Payer: Medicare Other

## 2014-10-13 ENCOUNTER — Ambulatory Visit (HOSPITAL_BASED_OUTPATIENT_CLINIC_OR_DEPARTMENT_OTHER)
Admission: RE | Admit: 2014-10-13 | Discharge: 2014-10-13 | Disposition: A | Discharge status: Home / Self Care | Payer: Medicare Other | Source: Ambulatory Visit | Attending: Family Medicine | Admitting: Family Medicine

## 2014-10-13 ENCOUNTER — Ambulatory Visit (INDEPENDENT_AMBULATORY_CARE_PROVIDER_SITE_OTHER): Payer: Medicare Other | Admitting: Family Medicine

## 2014-10-13 VITALS — BP 134/85 | HR 100

## 2014-10-13 DIAGNOSIS — J45901 Unspecified asthma with (acute) exacerbation: Secondary | ICD-10-CM | POA: Insufficient documentation

## 2014-10-13 DIAGNOSIS — J4541 Moderate persistent asthma with (acute) exacerbation: Secondary | ICD-10-CM

## 2014-10-13 DIAGNOSIS — J9811 Atelectasis: Secondary | ICD-10-CM | POA: Insufficient documentation

## 2014-10-13 DIAGNOSIS — I200 Unstable angina: Secondary | ICD-10-CM

## 2014-10-13 DIAGNOSIS — R000 Tachycardia, unspecified: Secondary | ICD-10-CM

## 2014-10-13 DIAGNOSIS — I10 Essential (primary) hypertension: Secondary | ICD-10-CM | POA: Diagnosis not present

## 2014-10-13 LAB — LIPID PANEL
Cholesterol: 178 mg/dL (ref 0–200)
HDL: 44.6 mg/dL (ref >39.00)
LDL Cholesterol: 98 mg/dL (ref 0–99)
NonHDL: 133.4
Total CHOL/HDL Ratio: 4
Triglycerides: 177 mg/dL — ABNORMAL HIGH (ref 0.0–149.0)
VLDL: 35.4 mg/dL (ref 0.0–40.0)

## 2014-10-13 LAB — HEPATIC FUNCTION PANEL
ALT: 19 U/L (ref 0–35)
AST: 22 U/L (ref 0–37)
Albumin: 3.9 g/dL (ref 3.5–5.2)
Alkaline Phosphatase: 82 U/L (ref 39–117)
Bilirubin, Direct: 0 mg/dL (ref 0.0–0.3)
Total Bilirubin: 0.5 mg/dL (ref 0.2–1.2)
Total Protein: 8.2 g/dL (ref 6.0–8.3)

## 2014-10-13 LAB — POCT URINALYSIS DIPSTICK
Bilirubin, UA: NEGATIVE
Blood, UA: NEGATIVE
Glucose, UA: NEGATIVE
Ketones, UA: NEGATIVE
Leukocytes, UA: NEGATIVE
Nitrite, UA: NEGATIVE
Spec Grav, UA: 1.02
Urobilinogen, UA: NEGATIVE
pH, UA: 7

## 2014-10-13 LAB — BASIC METABOLIC PANEL
BUN: 10 mg/dL (ref 6–23)
CO2: 33 mEq/L — ABNORMAL HIGH (ref 19–32)
Calcium: 9.1 mg/dL (ref 8.4–10.5)
Chloride: 99 mEq/L (ref 96–112)
Creatinine, Ser: 1 mg/dL (ref 0.4–1.2)
GFR: 78.5 mL/min (ref >60.00)
Glucose, Bld: 182 mg/dL — ABNORMAL HIGH (ref 70–99)
Potassium: 3.3 mEq/L — ABNORMAL LOW (ref 3.5–5.1)
Sodium: 140 mEq/L (ref 135–145)

## 2014-10-13 MED ORDER — METHYLPREDNISOLONE ACETATE 80 MG/ML IJ SUSP
80.0000 mg | Freq: Once | INTRAMUSCULAR | Status: AC
  Administered 2014-10-13: 80 mg via INTRAMUSCULAR

## 2014-10-13 MED ORDER — IPRATROPIUM-ALBUTEROL 0.5-2.5 (3) MG/3ML IN SOLN
3.0000 mL | Freq: Once | RESPIRATORY_TRACT | Status: AC
  Administered 2014-10-13: 3 mL via RESPIRATORY_TRACT

## 2014-10-13 MED ORDER — PREDNISONE 10 MG PO TABS: ORAL_TABLET | ORAL | Status: DC

## 2014-10-13 NOTE — Progress Notes (Signed)
  Subjective:     Heather Walker is a 53 y.o. female here for evaluation of a cough. Onset of symptoms was a few weeks ago. Symptoms have been gradually worsening since that time. The cough is barky and productive and is aggravated by exercise, infection and reclining position. Associated symptoms include: chills, fever, shortness of breath, sputum production and wheezing. Patient does have a history of asthma. Patient does have a history of environmental allergens. Patient has not traveled recently. Patient does not have a history of smoking. Patient has not had a previous chest x-ray. Patient has not had a PPD done.  The following portions of the patient's history were reviewed and updated as appropriate: allergies, current medications, past family history, past medical history, past social history, past surgical history and problem list.  Review of Systems Pertinent items are noted in HPI.    Objective:    Oxygen saturation 98% on room air BP 134/85 mmHg  Pulse 100  SpO2 98% General appearance: alert, cooperative, appears stated age and mild distress Ears: normal TM's and external ear canals both ears Nose: Nares normal. Septum midline. Mucosa normal. No drainage or sinus tenderness. Throat: lips, mucosa, and tongue normal; teeth and gums normal Neck: no adenopathy, supple, symmetrical, trachea midline and thyroid not enlarged, symmetric, no tenderness/mass/nodules Lungs: wheezes bilaterally Heart: S1, S2 normal    Assessment:    Acute Bronchitis and Asthma    Plan:    Explained lack of efficacy of antibiotics in viral disease. Antitussives per medication orders. Avoid exposure to tobacco smoke and fumes. B-agonist inhaler. Call if shortness of breath worsens, blood in sputum, change in character of cough, development of fever or chills, inability to maintain nutrition and hydration. Avoid exposure to tobacco smoke and fumes. Chest x-ray. depo medrol and pred taper   Pt may  need insulin if sugars increase

## 2014-10-13 NOTE — Progress Notes (Signed)
Pre visit review using our clinic review tool, if applicable. No additional management support is needed unless otherwise documented below in the visit note. 

## 2014-10-13 NOTE — Patient Instructions (Signed)

## 2014-10-13 NOTE — Telephone Encounter (Signed)
-----   Message from Rosalita Chessman, DO sent at 10/13/2014 11:26 AM EST ----- Pt needs neb machine with albuterol q6h prn

## 2014-10-15 ENCOUNTER — Telehealth: Payer: Self-pay | Admitting: Family Medicine

## 2014-10-15 ENCOUNTER — Ambulatory Visit: Payer: Medicare Other

## 2014-10-15 NOTE — Telephone Encounter (Signed)
Order has been faxed to [facility]

## 2014-10-15 NOTE — Telephone Encounter (Signed)
Caller name: Iyanla Relation to pt: self Call back number: [phone] Pharmacy: [facility]  Reason for call:   Patient states that she received a phone call from the company(patient doesn't know what company) regarding the asthma machine. She states that the company is not in network and machine will have to come from another company

## 2014-10-16 ENCOUNTER — Telehealth: Payer: Self-pay

## 2014-10-16 MED ORDER — NEBULIZER COMPRESSOR KIT: 1.0000 | PACK | Status: DC | PRN

## 2014-10-16 MED ORDER — INSULIN ASPART 100 UNIT/ML FLEXPEN: PEN_INJECTOR | SUBCUTANEOUS | Status: DC

## 2014-10-16 MED ORDER — ALBUTEROL SULFATE (2.5 MG/3ML) 0.083% IN NEBU: 2.5000 mg | INHALATION_SOLUTION | Freq: Four times a day (QID) | RESPIRATORY_TRACT | Status: DC | PRN

## 2014-10-16 NOTE — Telephone Encounter (Signed)
I made the patient aware that I can either mail her a script or she can pick up a script and she can take it to a medical supplier since many of the home delivery places will not accept Medicaid, she voiced understanding. She has requested the the Rx be mailed to her. Copy mailed.     KP

## 2014-10-16 NOTE — Telephone Encounter (Signed)
Patient has been made aware of her results and has verbalized understanding as well as agreed to increase the potassium to two daily. She said her blood sugar has ranged between 305 and 324, I made her aware it is the prednisone. Please advise      KP

## 2014-10-16 NOTE — Telephone Encounter (Signed)
Reviewed the sliding scale with the patient multiple times to ensure she understands. Rx faxed, Per Dr.Lowne send Novolog.       KP

## 2014-10-16 NOTE — Telephone Encounter (Signed)
Regular insulin pen   Check glucose qid and  Follow sliding scale  200-250 2u  251-300   4 u 301-350 6 u 351-400 8 u > 400 10 u

## 2014-10-16 NOTE — Telephone Encounter (Signed)
-----   Message from Rosalita Chessman, DO sent at 10/13/2014 11:01 PM EST ----- Potassium is low--- inc kcl to 2 po qd  Recheck 2 weeks  Bmp--- dx hypokalemia

## 2014-10-20 ENCOUNTER — Telehealth: Payer: Self-pay

## 2014-10-20 ENCOUNTER — Telehealth: Payer: Self-pay | Admitting: Family Medicine

## 2014-10-20 DIAGNOSIS — E1165 Type 2 diabetes mellitus with hyperglycemia: Secondary | ICD-10-CM

## 2014-10-20 DIAGNOSIS — IMO0002 Reserved for concepts with insufficient information to code with codable children: Secondary | ICD-10-CM

## 2014-10-20 MED ORDER — NEBULIZER COMPRESSOR KIT: 1.0000 | PACK | Status: DC | PRN

## 2014-10-20 MED ORDER — METFORMIN HCL ER 500 MG PO TB24: 500.0000 mg | ORAL_TABLET | Freq: Every day | ORAL | Status: DC

## 2014-10-20 NOTE — Telephone Encounter (Signed)
Request for medical records received via fax from [facility].  Request faxed to Medical Records and original placed in scan basket to be scanned.

## 2014-10-20 NOTE — Telephone Encounter (Signed)
Rx faxed for both, The nebulizer Rx was initially mailed to the patient per her request.     KP

## 2014-10-20 NOTE — Telephone Encounter (Signed)
Caller name: Thamara, Leger Relation to pt: self  Call back number: [phone] Pharmacy:  [place], [place] [phone] (Phone)     Reason for call:  Pt states pharmacy never received metFORMIN (GLUCOPHAGE XR) 500 MG 24 hr tablet  Or asthma machine.

## 2014-10-21 ENCOUNTER — Telehealth: Payer: Self-pay | Admitting: *Deleted

## 2014-10-21 MED ORDER — INSULIN LISPRO 100 UNIT/ML (KWIKPEN): PEN_INJECTOR | SUBCUTANEOUS | Status: DC

## 2014-10-21 NOTE — Telephone Encounter (Signed)
Ok to change

## 2014-10-21 NOTE — Telephone Encounter (Signed)
Patients insurance does not cover Novolog, but will cover Humalog. Please advise. JG//CMA

## 2014-10-21 NOTE — Telephone Encounter (Signed)
Humalog kwikpen e-scribed to pharmacy

## 2014-10-23 ENCOUNTER — Ambulatory Visit: Payer: Medicare Other

## 2014-10-30 ENCOUNTER — Ambulatory Visit: Payer: Medicare Other

## 2014-10-31 ENCOUNTER — Ambulatory Visit: Payer: Medicare Other | Admitting: Family Medicine

## 2014-11-17 ENCOUNTER — Encounter (HOSPITAL_BASED_OUTPATIENT_CLINIC_OR_DEPARTMENT_OTHER): Payer: Medicare Other

## 2014-12-08 ENCOUNTER — Encounter: Payer: Self-pay | Admitting: Family Medicine

## 2014-12-08 ENCOUNTER — Ambulatory Visit (INDEPENDENT_AMBULATORY_CARE_PROVIDER_SITE_OTHER): Payer: Medicare Other | Admitting: Family Medicine

## 2014-12-08 VITALS — BP 142/94 | HR 79 | Temp 97.9°F | Wt 216.4 lb

## 2014-12-08 DIAGNOSIS — I10 Essential (primary) hypertension: Secondary | ICD-10-CM | POA: Diagnosis not present

## 2014-12-08 DIAGNOSIS — R002 Palpitations: Secondary | ICD-10-CM | POA: Diagnosis not present

## 2014-12-08 LAB — EKG 12-LEAD

## 2014-12-08 MED ORDER — LISINOPRIL 10 MG PO TABS: 10.0000 mg | ORAL_TABLET | Freq: Every day | ORAL | Status: DC

## 2014-12-08 NOTE — Patient Instructions (Signed)

## 2014-12-08 NOTE — Assessment & Plan Note (Signed)
ekg-- no change from previous If occurs again rto or go to ER Check tsh and panal

## 2014-12-08 NOTE — Progress Notes (Signed)
Pre visit review using our clinic review tool, if applicable. No additional management support is needed unless otherwise documented below in the visit note. 

## 2014-12-08 NOTE — Progress Notes (Addendum)
Subjective:    Patient ID: Heather Walker, female    DOB: [birth year], 54 y.o.   MRN: [id number]  HPI  Patient here for f/u atelectasis.  Pt is feeling much better.  Past Medical History  Diagnosis Date  . Hypertension   . Depression   . Asthma   . Arthritis   . Heart murmur   . Diabetes mellitus without complication     Review of Systems  Constitutional: Negative for activity change, appetite change, fatigue and unexpected weight change.  Respiratory: Negative for cough and shortness of breath.   Cardiovascular: Negative for chest pain and palpitations.  Psychiatric/Behavioral: Negative for behavioral problems and dysphoric mood. The patient is not nervous/anxious.        Objective:    Physical Exam  Constitutional: She is oriented to person, place, and time. She appears well-developed and well-nourished.  HENT:  Head: Normocephalic and atraumatic.  Eyes: Conjunctivae and EOM are normal.  Neck: Normal range of motion. Neck supple. No JVD present. Carotid bruit is not present. No thyromegaly present.  Cardiovascular: Normal rate and regular rhythm.   Murmur heard. Pulmonary/Chest: Effort normal and breath sounds normal. No respiratory distress. She has no wheezes. She has no rales. She exhibits no tenderness.  Musculoskeletal: She exhibits no edema or tenderness.  Walks with cane  Neurological: She is alert and oriented to person, place, and time.  Psychiatric: She has a normal mood and affect.    BP 142/94 mmHg  Pulse 79  Temp(Src) 97.9 F (36.6 C) (Oral)  Wt 216 lb 6.4 oz (98.158 kg)  SpO2 96% Wt Readings from Last 3 Encounters:  12/08/14 216 lb 6.4 oz (98.158 kg)  10/03/14 220 lb (99.791 kg)  09/04/14 220 lb (99.791 kg)     Lab Results  Component Value Date   WBC 4.1 09/04/2014   HGB 12.6 09/04/2014   HCT 38.0 09/04/2014   PLT 245 09/04/2014   GLUCOSE 182* 10/13/2014   CHOL 178 10/13/2014   TRIG 177.0* 10/13/2014   HDL 44.60 10/13/2014   LDLCALC  98 10/13/2014   ALT 19 10/13/2014   AST 22 10/13/2014   NA 140 10/13/2014   K 3.3* 10/13/2014   CL 99 10/13/2014   CREATININE 1.0 10/13/2014   BUN 10 10/13/2014   CO2 33* 10/13/2014   TSH 1.360 07/13/2014   HGBA1C 9.4* 07/13/2014   MICROALBUR 1.0 07/25/2014    Dg Chest 2 View  10/13/2014   CLINICAL DATA:  Asthma with acute exacerbation.  Cough.  EXAM: CHEST  2 VIEW  COMPARISON:  Chest radiograph 09/04/2014, multiple priors including CT 05/02/2013  FINDINGS: Minimal atelectasis right greater than left lung base. The cardiomediastinal contours are normal. Pulmonary vasculature is normal. No consolidation, pleural effusion, or pneumothorax. No acute osseous abnormalities are seen, degenerative change in the lumbar spine is unchanged per.  IMPRESSION: Minimal bibasilar atelectasis, otherwise no acute pulmonary process.   Electronically Signed   By: Jeb Levering M.D.   On: 10/13/2014 11:49       Assessment & Plan:   Problem List Items Addressed This Visit    Essential hypertension - Primary   Relevant Medications   lisinopril (PRINIVIL,ZESTRIL) tablet   Palpitations    ekg-- no change from previous If occurs again rto or go to ER Check tsh and panal      Relevant Orders   EKG 12-Lead (Completed)   Thyroid Panel With TSH       Garnet Koyanagi, DO

## 2014-12-09 LAB — THYROID PANEL WITH TSH
Free Thyroxine Index: 1.8 (ref 1.4–3.8)
T3 Uptake: 25 % (ref 22–35)
T4, Total: 7 ug/dL (ref 4.5–12.0)
TSH: 1.124 u[IU]/mL (ref 0.350–4.500)

## 2014-12-12 ENCOUNTER — Emergency Department (HOSPITAL_COMMUNITY): Payer: Medicare Other

## 2014-12-12 ENCOUNTER — Encounter (HOSPITAL_COMMUNITY): Payer: Self-pay | Admitting: Emergency Medicine

## 2014-12-12 ENCOUNTER — Other Ambulatory Visit: Payer: Self-pay

## 2014-12-12 DIAGNOSIS — L03311 Cellulitis of abdominal wall: Secondary | ICD-10-CM | POA: Diagnosis not present

## 2014-12-12 DIAGNOSIS — R011 Cardiac murmur, unspecified: Secondary | ICD-10-CM | POA: Insufficient documentation

## 2014-12-12 DIAGNOSIS — M1990 Unspecified osteoarthritis, unspecified site: Secondary | ICD-10-CM | POA: Diagnosis not present

## 2014-12-12 DIAGNOSIS — R079 Chest pain, unspecified: Secondary | ICD-10-CM | POA: Diagnosis not present

## 2014-12-12 DIAGNOSIS — I10 Essential (primary) hypertension: Secondary | ICD-10-CM | POA: Insufficient documentation

## 2014-12-12 DIAGNOSIS — Z794 Long term (current) use of insulin: Secondary | ICD-10-CM | POA: Diagnosis not present

## 2014-12-12 DIAGNOSIS — E119 Type 2 diabetes mellitus without complications: Secondary | ICD-10-CM | POA: Diagnosis not present

## 2014-12-12 DIAGNOSIS — J9811 Atelectasis: Secondary | ICD-10-CM | POA: Diagnosis not present

## 2014-12-12 DIAGNOSIS — Z8659 Personal history of other mental and behavioral disorders: Secondary | ICD-10-CM | POA: Insufficient documentation

## 2014-12-12 DIAGNOSIS — Z791 Long term (current) use of non-steroidal anti-inflammatories (NSAID): Secondary | ICD-10-CM | POA: Insufficient documentation

## 2014-12-12 DIAGNOSIS — Z79899 Other long term (current) drug therapy: Secondary | ICD-10-CM | POA: Insufficient documentation

## 2014-12-12 DIAGNOSIS — N61 Inflammatory disorders of breast: Secondary | ICD-10-CM | POA: Diagnosis not present

## 2014-12-12 DIAGNOSIS — Z7982 Long term (current) use of aspirin: Secondary | ICD-10-CM | POA: Diagnosis not present

## 2014-12-12 DIAGNOSIS — J45909 Unspecified asthma, uncomplicated: Secondary | ICD-10-CM | POA: Diagnosis not present

## 2014-12-12 LAB — CBC
HCT: 35.2 % — ABNORMAL LOW (ref 36.0–46.0)
Hemoglobin: 12 g/dL (ref 12.0–15.0)
MCH: 28.1 pg (ref 26.0–34.0)
MCHC: 34.1 g/dL (ref 30.0–36.0)
MCV: 82.4 fL (ref 78.0–100.0)
Platelets: 245 10*3/uL (ref 150–400)
RBC: 4.27 MIL/uL (ref 3.87–5.11)
RDW: 14.3 % (ref 11.5–15.5)
WBC: 10.1 10*3/uL (ref 4.0–10.5)

## 2014-12-12 LAB — I-STAT TROPONIN, ED: Troponin i, poc: 0 ng/mL (ref 0.00–0.08)

## 2014-12-12 LAB — BASIC METABOLIC PANEL
Anion gap: 14 (ref 5–15)
BUN: 12 mg/dL (ref 6–23)
CO2: 22 mmol/L (ref 19–32)
Calcium: 8.9 mg/dL (ref 8.4–10.5)
Chloride: 100 mmol/L (ref 96–112)
Creatinine, Ser: 1.56 mg/dL — ABNORMAL HIGH (ref 0.50–1.10)
GFR calc Af Amer: 43 mL/min — ABNORMAL LOW (ref >90)
GFR calc non Af Amer: 37 mL/min — ABNORMAL LOW (ref >90)
Glucose, Bld: 201 mg/dL — ABNORMAL HIGH (ref 70–99)
Potassium: 2.8 mmol/L — ABNORMAL LOW (ref 3.5–5.1)
Sodium: 136 mmol/L (ref 135–145)

## 2014-12-12 LAB — BRAIN NATRIURETIC PEPTIDE: B Natriuretic Peptide: 19.1 pg/mL (ref 0.0–100.0)

## 2014-12-12 NOTE — ED Notes (Signed)
C/o intermittent "shooting" pain in L chest since last night with sob, dizziness, and nausea.

## 2014-12-13 ENCOUNTER — Emergency Department (HOSPITAL_COMMUNITY)
Admission: EM | Admit: 2014-12-13 | Discharge: 2014-12-13 | Disposition: A | Discharge status: Home / Self Care | Payer: Medicare Other | Attending: Emergency Medicine | Admitting: Emergency Medicine

## 2014-12-13 DIAGNOSIS — L0291 Cutaneous abscess, unspecified: Secondary | ICD-10-CM

## 2014-12-13 DIAGNOSIS — R079 Chest pain, unspecified: Secondary | ICD-10-CM

## 2014-12-13 DIAGNOSIS — L0390 Cellulitis, unspecified: Secondary | ICD-10-CM

## 2014-12-13 LAB — I-STAT TROPONIN, ED: Troponin i, poc: 0.01 ng/mL (ref 0.00–0.08)

## 2014-12-13 MED ORDER — DIPHENHYDRAMINE HCL 25 MG PO CAPS
50.0000 mg | ORAL_CAPSULE | Freq: Once | ORAL | Status: AC
Start: 2014-12-13 — End: 2014-12-13
  Administered 2014-12-13: 50 mg via ORAL
  Filled 2014-12-13: qty 2

## 2014-12-13 MED ORDER — CEPHALEXIN 500 MG PO CAPS: 500.0000 mg | ORAL_CAPSULE | Freq: Two times a day (BID) | ORAL | Status: DC

## 2014-12-13 MED ORDER — METOCLOPRAMIDE HCL 10 MG PO TABS
10.0000 mg | ORAL_TABLET | Freq: Once | ORAL | Status: AC
  Administered 2014-12-13: 10 mg via ORAL
  Filled 2014-12-13: qty 1

## 2014-12-13 MED ORDER — DOXYCYCLINE HYCLATE 100 MG PO TABS
100.0000 mg | ORAL_TABLET | Freq: Once | ORAL | Status: AC
  Administered 2014-12-13: 100 mg via ORAL
  Filled 2014-12-13: qty 1

## 2014-12-13 MED ORDER — DOXYCYCLINE HYCLATE 100 MG PO TABS: 100.0000 mg | ORAL_TABLET | Freq: Two times a day (BID) | ORAL | Status: DC

## 2014-12-13 MED ORDER — CEPHALEXIN 250 MG PO CAPS
500.0000 mg | ORAL_CAPSULE | Freq: Once | ORAL | Status: AC
  Administered 2014-12-13: 500 mg via ORAL
  Filled 2014-12-13: qty 2

## 2014-12-13 MED ORDER — POTASSIUM CHLORIDE CRYS ER 20 MEQ PO TBCR
60.0000 meq | EXTENDED_RELEASE_TABLET | Freq: Once | ORAL | Status: AC
  Administered 2014-12-13: 60 meq via ORAL
  Filled 2014-12-13: qty 3

## 2014-12-13 MED ORDER — IBUPROFEN 800 MG PO TABS
800.0000 mg | ORAL_TABLET | Freq: Once | ORAL | Status: AC
  Administered 2014-12-13: 800 mg via ORAL
  Filled 2014-12-13: qty 1

## 2014-12-13 NOTE — ED Provider Notes (Signed)
CSN: [id number]     Arrival date & time 12/12/14  2037 History   This chart was scribed for Everlene Balls, MD by Jeanell Sparrow, ED Scribe. This patient was seen in room A11C/A11C and the patient's care was started at 12:54 AM.   Chief Complaint  Patient presents with  . Chest Pain   The history is provided by the patient. No language interpreter was used.    HPI Comments: Heather Walker is a 54 y.o. female who presents to the Emergency Department complaining of intermittent moderate left sided chest pain that started yesterday. She reports that the pain has been constant and worse today, with pain currently present. She states that the pain intermittently radiates to her right side. She states that she has a prior hx of chest pain. She reports that she took two aleve with minimal relief. She reports having some nausea, diarrhea over the past couple of days, diaphoresis, SOB, and decreased appetite. She states that she has a hx of being placed on a heart monitor. She denies any hx of heart stents. She also denies any vomiting.   Past Medical History  Diagnosis Date  . Hypertension   . Depression   . Asthma   . Arthritis   . Heart murmur   . Diabetes mellitus without complication    Past Surgical History  Procedure Laterality Date  . Abdominal hysterectomy    . Bladder suspension    . Appendectomy    . Cesarean section      3 previous  . Tubal ligation     Family History  Problem Relation Age of Onset  . Hypertension Mother   . Asthma Mother   . Hypertension Sister   . Asthma Sister   . Hypertension Maternal Grandmother   . Heart defect Sister   . Asthma Sister   . Aneurysm Sister   . Asthma Sister    History  Substance Use Topics  . Smoking status: Never Smoker   . Smokeless tobacco: Never Used  . Alcohol Use: No   OB History    Gravida Para Term Preterm AB TAB SAB Ectopic Multiple Living   '3 3 3       3     '$ Review of Systems 10 Systems reviewed and all are negative  for acute change except as noted in the HPI.   Allergies  Codeine  Home Medications   Prior to Admission medications   Medication Sig Start Date End Date Taking? Authorizing Provider  albuterol (PROVENTIL HFA;VENTOLIN HFA) 108 (90 BASE) MCG/ACT inhaler Inhale 2 puffs into the lungs every 6 (six) hours as needed. For shortness of breath. 07/11/14   Rosalita Chessman, DO  albuterol (PROVENTIL) (2.5 MG/3ML) 0.083% nebulizer solution Take 3 mLs (2.5 mg total) by nebulization every 6 (six) hours as needed for wheezing or shortness of breath. 10/16/14   Rosalita Chessman, DO  aspirin EC 81 MG EC tablet Take 1 tablet (81 mg total) by mouth daily. 07/15/14   Eugenie Filler, MD  cloNIDine (CATAPRES) 0.2 MG tablet Take 1 tablet (0.2 mg total) by mouth 2 (two) times daily. 08/15/14   Rosalita Chessman, DO  cyclobenzaprine (FLEXERIL) 5 MG tablet Take 1 tablet (5 mg total) by mouth 3 (three) times daily as needed for muscle spasms. 09/18/14   Rosalita Chessman, DO  diclofenac (VOLTAREN) 75 MG EC tablet Take 1 tablet (75 mg total) by mouth 2 (two) times daily. 09/18/14   Alferd Apa  Lowne, DO  fluconazole (DIFLUCAN) 150 MG tablet 1 po qd x1, may repeat in 3 days prn 08/15/14   Rosalita Chessman, DO  glucose blood (BAYER CONTOUR NEXT TEST) test strip Use as instructed to test Blood Glucose daily Dx: E11.9 07/25/14   Brunetta Jeans, PA-C  insulin lispro (HUMALOG) 100 UNIT/ML KiwkPen Use insulin on a sliding scale as needed while on Prednisone 10/21/14   Rosalita Chessman, DO  Lancets MISC USE AS DIRECTED TO TEST BLOOD GLUCOSE 07/25/14   Brunetta Jeans, PA-C  lisinopril (PRINIVIL,ZESTRIL) 10 MG tablet Take 1 tablet (10 mg total) by mouth daily. 12/08/14   Rosalita Chessman, DO  metFORMIN (GLUCOPHAGE XR) 500 MG 24 hr tablet Take 1 tablet (500 mg total) by mouth daily with breakfast. 10/20/14   Rosalita Chessman, DO  Naproxen Sodium (ALEVE) 220 MG CAPS Take 440 mg by mouth daily as needed (pain).    Historical Provider, MD  ondansetron  (ZOFRAN) 4 MG tablet Take 1 tablet (4 mg total) by mouth every 6 (six) hours. 09/04/14   Quintella Reichert, MD  oxyCODONE-acetaminophen (PERCOCET/ROXICET) 5-325 MG per tablet Take 1 tablet by mouth every 6 (six) hours as needed for moderate pain or severe pain. 09/18/14   Alferd Apa Lowne, DO  pantoprazole (PROTONIX) 40 MG tablet Take 1 tablet (40 mg total) by mouth daily. 08/20/14   Sueanne Margarita, MD  potassium chloride 20 MEQ TBCR Take 20 mEq by mouth daily. 07/15/14   Eugenie Filler, MD  Respiratory Therapy Supplies (NEBULIZER COMPRESSOR) KIT 1 Device by Does not apply route as needed. 10/20/14   Rosalita Chessman, DO  traMADol (ULTRAM) 50 MG tablet Take 1 tablet (50 mg total) by mouth every 6 (six) hours as needed for moderate pain. 09/18/14   Rosalita Chessman, DO  traZODone (DESYREL) 50 MG tablet Take 0.5-1 tablets (25-50 mg total) by mouth at bedtime as needed for sleep. 10/03/14   Rosalita Chessman, DO  triamterene-hydrochlorothiazide (MAXZIDE) 75-50 MG per tablet Take 1 tablet by mouth daily. 07/11/14   Yvonne R Lowne, DO   BP 100/70 mmHg  Pulse 99  Temp(Src) 98.9 F (37.2 C) (Oral)  Resp 20  SpO2 98% Physical Exam  Constitutional: She is oriented to person, place, and time. She appears well-developed and well-nourished. No distress.  HENT:  Head: Normocephalic and atraumatic.  Nose: Nose normal.  Mouth/Throat: Oropharynx is clear and moist. No oropharyngeal exudate.  Eyes: Conjunctivae and EOM are normal. Pupils are equal, round, and reactive to light. No scleral icterus.  Neck: Normal range of motion. Neck supple. No JVD present. No tracheal deviation present. No thyromegaly present.  Cardiovascular: Normal rate, regular rhythm and normal heart sounds.  Exam reveals no gallop and no friction rub.   No murmur heard. Pulmonary/Chest: Effort normal and breath sounds normal. No respiratory distress. She has no wheezes. She exhibits no tenderness.  Abdominal: Soft. Bowel sounds are normal. She  exhibits no distension and no mass. There is no tenderness. There is no rebound and no guarding.  Musculoskeletal: Normal range of motion. She exhibits no edema or tenderness.  Lymphadenopathy:    She has no cervical adenopathy.  Neurological: She is alert and oriented to person, place, and time. No cranial nerve deficit. She exhibits normal muscle tone.  Skin: Skin is warm and dry. No rash noted. No pallor.  2 cm circumvential abscess on left breast with a  6 cm area of erythema that is actively  draining.   Nursing note and vitals reviewed.   ED Course  Procedures (including critical care time) DIAGNOSTIC STUDIES: Oxygen Saturation is 98% on RA, normal by my interpretation.    COORDINATION OF CARE: 12:59 AM- Pt advised of plan for treatment which includes medication, radiology, and labs and pt agrees.  Labs Review Labs Reviewed  CBC - Abnormal; Notable for the following:    HCT 35.2 (*)    All other components within normal limits  BASIC METABOLIC PANEL - Abnormal; Notable for the following:    Potassium 2.8 (*)    Glucose, Bld 201 (*)    Creatinine, Ser 1.56 (*)    GFR calc non Af Amer 37 (*)    GFR calc Af Amer 43 (*)    All other components within normal limits  BRAIN NATRIURETIC PEPTIDE  I-STAT TROPOININ, ED  I-STAT TROPOININ, ED  I-STAT TROPOININ, ED    Imaging Review Dg Chest 2 View  12/12/2014   CLINICAL DATA:  Intermittent shooting pain in the left chest since last night. Shortness of breath, dizziness, and nausea. Tachycardia appear  EXAM: CHEST  2 VIEW  COMPARISON:  10/13/2014  FINDINGS: Shallow inspiration with slight linear atelectasis in the lung bases. Normal heart size and pulmonary vascularity. No focal airspace disease or consolidation in the lungs. No blunting of costophrenic angles. No pneumothorax. Mediastinal contours appear intact. Surgical clips in the right upper quadrant.  IMPRESSION: Atelectasis in the lung bases. No evidence of active pulmonary  disease.   Electronically Signed   By: Lucienne Capers M.D.   On: 12/12/2014 21:21     EKG Interpretation None     MUSE not working: NSR, rate 100, LAD, nml intervals, no ischemic changes when compared to prior MDM   Final diagnoses:  None   Patient presents emergency department for chest pain that has been intermittent for the past 2 years. Her current episode has been going on for the past couple of days. Her history is not consistent with ACS, as described as sharp, there is no exertional component.  Her pain is not directly related to shortness of breath, vomiting, diaphoresis. She can have these symptoms aside from the chest pain. 2 sets of troponins here are negative, EKG is unchanged.  patient also has a small breast abscess under her left breast with the area of cellulitis. It is actively draining, this may be the cause of her pain. She was given Keflex and doxycycline and will be discharged with prescription. Her potassium was also replaced with 60 mEq orally. Her vital signs were within her normal limits and she is safe for discharge with primary care follow-up in 3 days.  I personally performed the services described in this documentation, which was scribed in my presence. The recorded information has been reviewed and is accurate.     Everlene Balls, MD 12/13/14 [phone]

## 2014-12-13 NOTE — Discharge Instructions (Signed)
Abscess Ms. Lerch, you were seen today for chest pain. Your EKG, blood work, chest x-ray did not show a cause of your pain. Continue to take antibiotics for abscess and follow-up with her primary care physician within 3 days for continued management. If symptoms worsen come back to the emergency department immediately. Thank you. An abscess (boil or furuncle) is an infected area on or under the skin. This area is filled with yellowish-white fluid (pus) and other material (debris). HOME CARE   Only take medicines as told by your doctor.  If you were given antibiotic medicine, take it as directed. Finish the medicine even if you start to feel better.  If gauze is used, follow your doctor's directions for changing the gauze.  To avoid spreading the infection:  Keep your abscess covered with a bandage.  Wash your hands well.  Do not share personal care items, towels, or whirlpools with others.  Avoid skin contact with others.  Keep your skin and clothes clean around the abscess.  Keep all doctor visits as told. GET HELP RIGHT AWAY IF:   You have more pain, puffiness (swelling), or redness in the wound site.  You have more fluid or blood coming from the wound site.  You have muscle aches, chills, or you feel sick.  You have a fever. MAKE SURE YOU:   Understand these instructions.  Will watch your condition.  Will get help right away if you are not doing well or get worse. Document Released: 03/21/2008 Document Revised: 04/03/2012 Document Reviewed: 12/16/2011 [facility] Patient Information 2015 [place], [place]. This information is not intended to replace advice given to you by your health care provider. Make sure you discuss any questions you have with your health care provider.

## 2014-12-16 ENCOUNTER — Ambulatory Visit (INDEPENDENT_AMBULATORY_CARE_PROVIDER_SITE_OTHER): Payer: Medicare Other | Admitting: Family Medicine

## 2014-12-16 ENCOUNTER — Encounter: Payer: Self-pay | Admitting: Family Medicine

## 2014-12-16 VITALS — BP 109/77 | HR 75 | Temp 97.9°F | Wt 216.0 lb

## 2014-12-16 DIAGNOSIS — N611 Abscess of the breast and nipple: Secondary | ICD-10-CM

## 2014-12-16 DIAGNOSIS — N61 Inflammatory disorders of breast: Secondary | ICD-10-CM

## 2014-12-16 MED ORDER — LEVOFLOXACIN 500 MG PO TABS: 500.0000 mg | ORAL_TABLET | Freq: Every day | ORAL | Status: DC

## 2014-12-16 NOTE — Progress Notes (Signed)
Pre visit review using our clinic review tool, if applicable. No additional management support is needed unless otherwise documented below in the visit note. 

## 2014-12-16 NOTE — Progress Notes (Signed)
   Subjective:    Patient ID: Heather Walker, female    DOB: [birth year], 54 y.o.   MRN: [id number]  HPI  Patient here for f/u abscess L breast---  Pt was seen in ER with CP and told them about it but they did not do I&D because they felt it was draining on its own --per pt.  She was put on doxy----pt states it has worsened since then and is much more painful.  Past Medical History  Diagnosis Date  . Hypertension   . Depression   . Asthma   . Arthritis   . Heart murmur   . Diabetes mellitus without complication     Review of Systems  Constitutional: Positive for fever and fatigue. Negative for activity change, appetite change and unexpected weight change.  Respiratory: Negative for cough and shortness of breath.   Cardiovascular: Negative for chest pain and palpitations.  Skin: Positive for color change and wound.       Under L breast + min amount of drainage from breast-- + errythema and inderation 3-4 inches in diameter Hot to touch  Painful Area cleaned and new dressing in place Wound culture done  Psychiatric/Behavioral: Negative for behavioral problems and dysphoric mood. The patient is not nervous/anxious.        Objective:    Physical Exam  Constitutional: She is oriented to person, place, and time. She appears well-developed and well-nourished.  HENT:  Head: Normocephalic and atraumatic.  Eyes: Conjunctivae and EOM are normal.  Neck: Normal range of motion. Neck supple. No JVD present. Carotid bruit is not present. No thyromegaly present.  Cardiovascular: Normal rate, regular rhythm and normal heart sounds.   Pulmonary/Chest: Effort normal and breath sounds normal. No respiratory distress. She exhibits tenderness.  Genitourinary: There is breast tenderness.  L breast-- + min amount drainage from under breast--+ errythema, + hot to touch and painful  Musculoskeletal: She exhibits no edema.  Neurological: She is alert and oriented to person, place, and time.    Psychiatric: She has a normal mood and affect.    BP 109/77 mmHg  Pulse 75  Temp(Src) 97.9 F (36.6 C) (Oral)  Wt 216 lb (97.977 kg)  SpO2 98% Wt Readings from Last 3 Encounters:  12/16/14 216 lb (97.977 kg)  12/08/14 216 lb 6.4 oz (98.158 kg)  10/03/14 220 lb (99.791 kg)     Lab Results  Component Value Date   WBC 10.1 12/12/2014   HGB 12.0 12/12/2014   HCT 35.2* 12/12/2014   PLT 245 12/12/2014   GLUCOSE 201* 12/12/2014   CHOL 178 10/13/2014   TRIG 177.0* 10/13/2014   HDL 44.60 10/13/2014   LDLCALC 98 10/13/2014   ALT 19 10/13/2014   AST 22 10/13/2014   NA 136 12/12/2014   K 2.8* 12/12/2014   CL 100 12/12/2014   CREATININE 1.56* 12/12/2014   BUN 12 12/12/2014   CO2 22 12/12/2014   TSH 1.124 12/08/2014   HGBA1C 9.4* 07/13/2014   MICROALBUR 1.0 07/25/2014    No results found.     Assessment & Plan:   Problem List Items Addressed This Visit    None    Visit Diagnoses    Abscess of breast    -  Primary    Relevant Medications    levofloxacin (LEVAQUIN) tablet    Other Relevant Orders    Ambulatory referral to General Surgery    Wound culture        Garnet Koyanagi, DO

## 2014-12-16 NOTE — Patient Instructions (Signed)

## 2014-12-20 LAB — WOUND CULTURE
Gram Stain: NONE SEEN
Gram Stain: NONE SEEN
Gram Stain: NONE SEEN

## 2014-12-22 ENCOUNTER — Ambulatory Visit: Payer: Medicare Other | Admitting: Family Medicine

## 2014-12-23 ENCOUNTER — Ambulatory Visit (HOSPITAL_BASED_OUTPATIENT_CLINIC_OR_DEPARTMENT_OTHER)
Admission: RE | Admit: 2014-12-23 | Discharge: 2014-12-23 | Disposition: A | Discharge status: Home / Self Care | Payer: Medicare Other | Source: Ambulatory Visit | Attending: Family Medicine | Admitting: Family Medicine

## 2014-12-23 ENCOUNTER — Encounter: Payer: Self-pay | Admitting: Family Medicine

## 2014-12-23 ENCOUNTER — Encounter (INDEPENDENT_AMBULATORY_CARE_PROVIDER_SITE_OTHER): Payer: Self-pay | Admitting: General Surgery

## 2014-12-23 ENCOUNTER — Ambulatory Visit (INDEPENDENT_AMBULATORY_CARE_PROVIDER_SITE_OTHER): Payer: Medicare Other | Admitting: Family Medicine

## 2014-12-23 VITALS — BP 118/80 | HR 74 | Temp 98.5°F

## 2014-12-23 DIAGNOSIS — R269 Unspecified abnormalities of gait and mobility: Secondary | ICD-10-CM | POA: Diagnosis not present

## 2014-12-23 DIAGNOSIS — R918 Other nonspecific abnormal finding of lung field: Secondary | ICD-10-CM | POA: Diagnosis not present

## 2014-12-23 DIAGNOSIS — R110 Nausea: Secondary | ICD-10-CM | POA: Insufficient documentation

## 2014-12-23 DIAGNOSIS — R0602 Shortness of breath: Secondary | ICD-10-CM | POA: Insufficient documentation

## 2014-12-23 DIAGNOSIS — R531 Weakness: Secondary | ICD-10-CM | POA: Diagnosis not present

## 2014-12-23 DIAGNOSIS — K219 Gastro-esophageal reflux disease without esophagitis: Secondary | ICD-10-CM | POA: Diagnosis not present

## 2014-12-23 MED ORDER — PANTOPRAZOLE SODIUM 40 MG PO TBEC: 40.0000 mg | DELAYED_RELEASE_TABLET | Freq: Every day | ORAL | Status: DC

## 2014-12-23 NOTE — Progress Notes (Signed)
Pre visit review using our clinic review tool, if applicable. No additional management support is needed unless otherwise documented below in the visit note. 

## 2014-12-23 NOTE — Progress Notes (Signed)
Subjective:    Patient ID: Heather Walker, female    DOB: 07/09/1961, 54 y.o.   MRN: [id number]  HPI  Patient here for f/u abscess.  She saw surgery earlier today and it is healing well.  Pt c/o sob and weakness that con't ----see ER visit.  Pt is still not taking K and her K was low in ER.    Past Medical History  Diagnosis Date  . Hypertension   . Depression   . Asthma   . Arthritis   . Heart murmur   . Diabetes mellitus without complication     Review of Systems  Constitutional: Positive for fatigue. Negative for activity change, appetite change and unexpected weight change.  Respiratory: Negative for cough and shortness of breath.   Cardiovascular: Negative for chest pain and palpitations.  Musculoskeletal: Positive for back pain and arthralgias.  Neurological: Positive for weakness, light-headedness and headaches. Negative for dizziness, facial asymmetry and numbness.  Psychiatric/Behavioral: Negative for behavioral problems and dysphoric mood. The patient is not nervous/anxious.     Current Outpatient Prescriptions on File Prior to Visit  Medication Sig Dispense Refill  . albuterol (PROVENTIL HFA;VENTOLIN HFA) 108 (90 BASE) MCG/ACT inhaler Inhale 2 puffs into the lungs every 6 (six) hours as needed. For shortness of breath. 1 Inhaler 4  . albuterol (PROVENTIL) (2.5 MG/3ML) 0.083% nebulizer solution Take 3 mLs (2.5 mg total) by nebulization every 6 (six) hours as needed for wheezing or shortness of breath. 150 mL 1  . aspirin EC 81 MG EC tablet Take 1 tablet (81 mg total) by mouth daily.    . cloNIDine (CATAPRES) 0.2 MG tablet Take 1 tablet (0.2 mg total) by mouth 2 (two) times daily. 60 tablet 2  . cyclobenzaprine (FLEXERIL) 5 MG tablet Take 1 tablet (5 mg total) by mouth 3 (three) times daily as needed for muscle spasms. 30 tablet 1  . diclofenac (VOLTAREN) 75 MG EC tablet Take 1 tablet (75 mg total) by mouth 2 (two) times daily. 60 tablet 2  . glucose blood (BAYER CONTOUR  NEXT TEST) test strip Use as instructed to test Blood Glucose daily Dx: E11.9 100 each 5  . insulin lispro (HUMALOG) 100 UNIT/ML KiwkPen Use insulin on a sliding scale as needed while on Prednisone 15 mL 0  . Lancets MISC USE AS DIRECTED TO TEST BLOOD GLUCOSE 100 each 5  . levofloxacin (LEVAQUIN) 500 MG tablet Take 1 tablet (500 mg total) by mouth daily. 10 tablet 0  . lisinopril (PRINIVIL,ZESTRIL) 10 MG tablet Take 1 tablet (10 mg total) by mouth daily. 30 tablet 2  . metFORMIN (GLUCOPHAGE XR) 500 MG 24 hr tablet Take 1 tablet (500 mg total) by mouth daily with breakfast. 30 tablet 2  . Naproxen Sodium (ALEVE) 220 MG CAPS Take 440 mg by mouth daily as needed (pain).    . ondansetron (ZOFRAN) 4 MG tablet Take 1 tablet (4 mg total) by mouth every 6 (six) hours. 12 tablet 0  . oxyCODONE-acetaminophen (PERCOCET/ROXICET) 5-325 MG per tablet Take 1 tablet by mouth every 6 (six) hours as needed for moderate pain or severe pain. 10 tablet 0  . potassium chloride 20 MEQ TBCR Take 20 mEq by mouth daily. 30 tablet 0  . Respiratory Therapy Supplies (NEBULIZER COMPRESSOR) KIT 1 Device by Does not apply route as needed. 1 each 0  . traMADol (ULTRAM) 50 MG tablet Take 1 tablet (50 mg total) by mouth every 6 (six) hours as needed for moderate pain.  30 tablet 2  . traZODone (DESYREL) 50 MG tablet Take 0.5-1 tablets (25-50 mg total) by mouth at bedtime as needed for sleep. 30 tablet 3  . triamterene-hydrochlorothiazide (MAXZIDE) 75-50 MG per tablet Take 1 tablet by mouth daily. 90 tablet 1   No current facility-administered medications on file prior to visit.       Objective:    Physical Exam  Constitutional: She is oriented to person, place, and time. She appears well-developed and well-nourished. No distress.  HENT:  Right Ear: External ear normal.  Left Ear: External ear normal.  Nose: Nose normal.  Mouth/Throat: Oropharynx is clear and moist.  Eyes: EOM are normal. Pupils are equal, round, and  reactive to light.  Neck: Normal range of motion. Neck supple.  Cardiovascular: Normal rate, regular rhythm and normal heart sounds.   No murmur heard. Pulmonary/Chest: Effort normal and breath sounds normal. No respiratory distress. She has no wheezes. She has no rales. She exhibits no tenderness.  Neurological: She is alert and oriented to person, place, and time.  Psychiatric: She has a normal mood and affect. Her behavior is normal. Judgment and thought content normal.    BP 118/80 mmHg  Pulse 74  Temp(Src) 98.5 F (36.9 C) (Oral)  SpO2 97% Wt Readings from Last 3 Encounters:  12/16/14 216 lb (97.977 kg)  12/08/14 216 lb 6.4 oz (98.158 kg)  10/03/14 220 lb (99.791 kg)     Lab Results  Component Value Date   WBC 10.1 12/12/2014   HGB 12.0 12/12/2014   HCT 35.2* 12/12/2014   PLT 245 12/12/2014   GLUCOSE 201* 12/12/2014   CHOL 178 10/13/2014   TRIG 177.0* 10/13/2014   HDL 44.60 10/13/2014   LDLCALC 98 10/13/2014   ALT 19 10/13/2014   AST 22 10/13/2014   NA 136 12/12/2014   K 2.8* 12/12/2014   CL 100 12/12/2014   CREATININE 1.56* 12/12/2014   BUN 12 12/12/2014   CO2 22 12/12/2014   TSH 1.124 12/08/2014   HGBA1C 9.4* 07/13/2014   MICROALBUR 1.0 07/25/2014       Assessment & Plan:   Problem List Items Addressed This Visit      Unprioritized   REFLUX, ESOPHAGEAL   Relevant Medications   pantoprazole (PROTONIX) EC tablet   Other Relevant Orders   Basic metabolic panel   CBC with Differential/Platelet   Hepatic function panel   POCT urinalysis dipstick   TSH   Vitamin B12   Vitamin D 1,25 dihydroxy    Other Visit Diagnoses    SOB (shortness of breath)    -  Primary    Relevant Orders    DG Chest 2 View    Basic metabolic panel    CBC with Differential/Platelet    Hepatic function panel    POCT urinalysis dipstick    TSH    Vitamin B12    Vitamin D 1,25 dihydroxy    Nausea without vomiting        Relevant Orders    DG Chest 2 View    Basic  metabolic panel    CBC with Differential/Platelet    Hepatic function panel    POCT urinalysis dipstick    TSH    Vitamin B12    Vitamin D 1,25 dihydroxy    Weakness        Relevant Orders    Basic metabolic panel    CBC with Differential/Platelet    Hepatic function panel    POCT urinalysis dipstick  TSH    Vitamin B12    Vitamin D 1,25 dihydroxy    Gait disorder        Relevant Orders    Basic metabolic panel    CBC with Differential/Platelet    Hepatic function panel    POCT urinalysis dipstick    TSH    Vitamin B12    Vitamin D 1,25 dihydroxy       I have discontinued Ms. Custis's fluconazole and cephALEXin. I am also having her maintain her triamterene-hydrochlorothiazide, albuterol, aspirin, Potassium Chloride ER, glucose blood, Lancets, cloNIDine, Naproxen Sodium, ondansetron, diclofenac, traMADol, oxyCODONE-acetaminophen, cyclobenzaprine, traZODone, albuterol, metFORMIN, Nebulizer Compressor, insulin lispro, lisinopril, levofloxacin, doxycycline, and pantoprazole.  Meds ordered this encounter  Medications  . doxycycline (VIBRA-TABS) 100 MG tablet    Sig: Take 100 mg by mouth 2 (two) times daily.    Refill:  0  . pantoprazole (PROTONIX) 40 MG tablet    Sig: Take 1 tablet (40 mg total) by mouth daily.    Dispense:  30 tablet    Refill:  [place], DO

## 2014-12-23 NOTE — Progress Notes (Signed)
Patient ID: CHARMIKA MACDONNELL, female   DOB: 04-16-1961, 54 y.o.   MRN: [id number]  [place] 12/23/2014 11:40 AM Location: [facility] Patient #: [id number] DOB: [birth year] Married / Language: English / Race: Black or African American Female History of Present Illness Odis Hollingshead MD; 12/23/2014 11:53 AM) The patient is a 54 year old female who presents for a follow-up for breast abscess.  Note:She is here for follow up of her left breast abscess. No fever. She c/o shortness of breath, poor appetite, and weakness. No fever. She has been taking Doxycycline since 3/4. She has an appointment with her PCP today.  Other Problems Jeralyn Ruths, CMA; 12/23/2014 11:41 AM) Heart murmur Diabetes Mellitus BREAST ABSCESS OF FEMALE (611.0  N61) High blood pressure Asthma Arthritis Chest pain Back Pain  Allergies Jeralyn Ruths, CMA; 12/23/2014 11:41 AM) Codeine Phosphate *ANALGESICS - OPIOID* Itching.  Medication History Jeralyn Ruths, CMA; 12/23/2014 11:41 AM) Medications Reconciled Doxycycline Hyclate (100MG  Tablet, 1 (one) Tablet Oral two times daily, Taken starting 12/19/2014) Active. Zofran (4MG  Tablet, 1 (one) Tablet Oral every six hours, as needed, Taken starting 12/19/2014) Active. Oxycodone-Acetaminophen (5-325MG  Tablet, Oral) Active. TraMADol HCl (50MG  Tablet, Oral) Active. Albuterol Sulfate ((2.5 MG/3ML)0.083% Nebulized Soln, Inhalation) Active. Cephalexin (500MG  Capsule, Oral) Active. CloNIDine HCl (0.1MG  Tablet, Oral) Active. CloNIDine HCl (0.2MG  Tablet, Oral) Active. [employer] (In Vitro) Active. Cyclobenzaprine HCl (5MG  Tablet, Oral) Active. Diclofenac Sodium (75MG  Tablet DR, Oral) Active. E-Z Ject Lancets 21G Active. Fluconazole (150MG  Tablet, Oral) Active. Lisinopril (10MG  Tablet, Oral) Active. MetFORMIN HCl (500MG  Tablet, Oral) Active. MetFORMIN HCl ER (500MG  Tablet ER 24HR, Oral) Active. Pantoprazole Sodium  (40MG  Tablet DR, Oral) Active. Potassium Chloride Crys ER (20MEQ Tablet ER, Oral) Active. PredniSONE (10MG  Tablet, Oral) Active. ProAir HFA (108 (90 Base)MCG/ACT Aerosol Soln, Inhalation) Active. TraZODone HCl (50MG  Tablet, Oral) Active. Triamterene-HCTZ (75-50MG  Tablet, Oral) Active.    Vitals Jearld Fenton Morris CMA; 12/23/2014 11:42 AM) 12/23/2014 11:41 AM Weight: 213.2 lb Height: 63in Body Surface Area: 2.07 m Body Mass Index: 37.77 kg/m Temp.: 34F(Oral)  Resp.: 18 (Unlabored)  BP: 120/80 (Sitting, Left Arm, Standard)     Physical Exam Odis Hollingshead MD; 12/23/2014 11:54 AM)  The physical exam findings are as follows: Note:Left breast-erythema has resolved, open wound is clean with no purulent, firm underlying mass at abscess site.  Right breast-no palpable mass.    Assessment & Plan Odis Hollingshead MD; 12/23/2014 11:56 AM)  BREAST ABSCESS OF FEMALE (611.0  N61) Impression: Left breast erythema has resolved. Has some induration vs underlying mass. Has not had a mammogram in 3 years. Has other symptoms which I cannot explain unless they are related to the antibiotics. She does have a firm mass at the abscess site which could be induration vs an underlying mass.  Plan: She is due to see Dr. Etter Sjogren today for evaluation of her shortness of breath and weakness. Continue current wound care. Follow up in two weeks. Mammogram when the wound heals.  Jackolyn Confer, MD

## 2014-12-23 NOTE — Patient Instructions (Signed)

## 2014-12-29 ENCOUNTER — Ambulatory Visit: Payer: Medicare Other | Admitting: Family Medicine

## 2015-01-06 ENCOUNTER — Other Ambulatory Visit (INDEPENDENT_AMBULATORY_CARE_PROVIDER_SITE_OTHER): Payer: Self-pay | Admitting: General Surgery

## 2015-01-06 DIAGNOSIS — N61 Inflammatory disorders of breast: Secondary | ICD-10-CM | POA: Diagnosis not present

## 2015-01-06 DIAGNOSIS — N611 Abscess of the breast and nipple: Secondary | ICD-10-CM

## 2015-01-06 NOTE — Addendum Note (Signed)
Addended by: Adin Hector on: 01/06/2015 06:23 PM   Modules accepted: Orders

## 2015-01-19 ENCOUNTER — Other Ambulatory Visit: Payer: Self-pay

## 2015-01-19 DIAGNOSIS — I10 Essential (primary) hypertension: Secondary | ICD-10-CM

## 2015-01-19 MED ORDER — CLONIDINE HCL 0.2 MG PO TABS: 0.2000 mg | ORAL_TABLET | Freq: Two times a day (BID) | ORAL | Status: DC

## 2015-02-12 ENCOUNTER — Other Ambulatory Visit (INDEPENDENT_AMBULATORY_CARE_PROVIDER_SITE_OTHER): Payer: Self-pay | Admitting: General Surgery

## 2015-02-12 ENCOUNTER — Other Ambulatory Visit: Payer: Self-pay | Admitting: *Deleted

## 2015-02-12 DIAGNOSIS — N611 Abscess of the breast and nipple: Secondary | ICD-10-CM

## 2015-02-12 NOTE — Addendum Note (Signed)
Addended by: Adin Hector on: 02/12/2015 01:33 PM   Modules accepted: Orders

## 2015-02-27 ENCOUNTER — Telehealth: Payer: Self-pay | Admitting: Family Medicine

## 2015-02-27 DIAGNOSIS — J45909 Unspecified asthma, uncomplicated: Secondary | ICD-10-CM

## 2015-02-27 DIAGNOSIS — IMO0002 Reserved for concepts with insufficient information to code with codable children: Secondary | ICD-10-CM

## 2015-02-27 DIAGNOSIS — K219 Gastro-esophageal reflux disease without esophagitis: Secondary | ICD-10-CM

## 2015-02-27 DIAGNOSIS — M19011 Primary osteoarthritis, right shoulder: Secondary | ICD-10-CM

## 2015-02-27 DIAGNOSIS — E1165 Type 2 diabetes mellitus with hyperglycemia: Secondary | ICD-10-CM

## 2015-02-27 DIAGNOSIS — I10 Essential (primary) hypertension: Secondary | ICD-10-CM

## 2015-02-27 MED ORDER — PANTOPRAZOLE SODIUM 40 MG PO TBEC: 40.0000 mg | DELAYED_RELEASE_TABLET | Freq: Every day | ORAL | Status: DC

## 2015-02-27 MED ORDER — ALBUTEROL SULFATE HFA 108 (90 BASE) MCG/ACT IN AERS: 2.0000 | INHALATION_SPRAY | Freq: Four times a day (QID) | RESPIRATORY_TRACT | Status: DC | PRN

## 2015-02-27 MED ORDER — LISINOPRIL 10 MG PO TABS: 10.0000 mg | ORAL_TABLET | Freq: Every day | ORAL | Status: DC

## 2015-02-27 MED ORDER — CLONIDINE HCL 0.2 MG PO TABS: 0.2000 mg | ORAL_TABLET | Freq: Two times a day (BID) | ORAL | Status: DC

## 2015-02-27 MED ORDER — METFORMIN HCL ER 500 MG PO TB24: 500.0000 mg | ORAL_TABLET | Freq: Every day | ORAL | Status: DC

## 2015-02-27 MED ORDER — TRIAMTERENE-HCTZ 75-50 MG PO TABS: 1.0000 | ORAL_TABLET | Freq: Every day | ORAL | Status: DC

## 2015-02-27 MED ORDER — GLUCOSE BLOOD VI STRP: ORAL_STRIP | Status: DC

## 2015-02-27 MED ORDER — LANCETS MISC: Status: DC

## 2015-02-27 NOTE — Telephone Encounter (Signed)
Relation to pt: self Call back number: [phone] Pharmacy: [facility], [place] [phone] (Phone) [phone] (Fax)         Reason for call:  Pt requesting 90 day supply of diabetic, blood pressure and pain medication due to insurance canceling soon.

## 2015-02-27 NOTE — Telephone Encounter (Signed)
Discussed with the patient and the regular med's have been sent, I will wait until Monday to send the request for the pain med's.     KP

## 2015-03-02 DIAGNOSIS — Z79891 Long term (current) use of opiate analgesic: Secondary | ICD-10-CM | POA: Diagnosis not present

## 2015-03-02 MED ORDER — TRAMADOL HCL 50 MG PO TABS: 50.0000 mg | ORAL_TABLET | Freq: Four times a day (QID) | ORAL | Status: DC | PRN

## 2015-03-02 NOTE — Telephone Encounter (Signed)
Refill #90 Need uds and contract

## 2015-03-02 NOTE — Telephone Encounter (Signed)
Mailbox full and unable to leave a message.     KP

## 2015-03-02 NOTE — Telephone Encounter (Signed)
Last seen 12/23/14 and filled 09/18/14 #30 with 2 She is requesting a 90 day supply because she is about to lose her Insurance No UDS, No contract    Please advise     KP

## 2015-03-17 ENCOUNTER — Telehealth: Payer: Self-pay | Admitting: Family Medicine

## 2015-03-17 MED ORDER — SULFAMETHOXAZOLE-TRIMETHOPRIM 800-160 MG PO TABS: 1.0000 | ORAL_TABLET | Freq: Two times a day (BID) | ORAL | Status: DC

## 2015-03-17 NOTE — Telephone Encounter (Signed)
Bactrim ds 1 po bid x 10 days--- ov late this week or early next

## 2015-03-17 NOTE — Telephone Encounter (Signed)
Rx faxed and the apt is scheduled for 03/20/15 at 4 pm.     KP

## 2015-03-17 NOTE — Telephone Encounter (Signed)
Caller name: Karene Relation to pt: self Call back number: [phone] Pharmacy: [facility] on [address]  Reason for call:   Patient states that she has another boil underneath her arm and is requesting something be called in for her.  Also, requesting a refill of test strips and needles

## 2015-03-17 NOTE — Telephone Encounter (Signed)
Relation to JZ:PHXT Call back number: [phone] Pharmacy: [facility], [place] [phone] (Phone) [phone] (Fax)         Reason for call:   Pt requesting a refill glucose blood (BAYER CONTOUR NEXT TEST) test strip and Contour "needles"

## 2015-03-17 NOTE — Telephone Encounter (Signed)
Patient has developed another abscess under her arm, requesting ABX, please advise     KP

## 2015-03-18 MED ORDER — GLUCOSE BLOOD VI STRP: ORAL_STRIP | Status: DC

## 2015-03-18 MED ORDER — LANCETS MISC: Status: DC

## 2015-03-18 NOTE — Telephone Encounter (Signed)
Rx re-faxed    KP 

## 2015-03-20 ENCOUNTER — Ambulatory Visit: Payer: Medicare Other

## 2015-03-20 ENCOUNTER — Ambulatory Visit: Payer: Medicare Other | Admitting: Family Medicine

## 2015-03-24 ENCOUNTER — Encounter: Payer: Self-pay | Admitting: Family Medicine

## 2015-03-24 ENCOUNTER — Telehealth: Payer: Self-pay | Admitting: Family Medicine

## 2015-03-24 NOTE — Telephone Encounter (Signed)
Pt was no show for appt 03/20/15- letter sent. Charge?

## 2015-03-24 NOTE — Telephone Encounter (Signed)
YES

## 2015-04-06 ENCOUNTER — Ambulatory Visit: Payer: Medicare Other | Admitting: Family Medicine

## 2015-05-27 IMAGING — CR DG SINUSES COMPLETE 3+V
6 series · 6 of 6 positions shown · non-contrast
Comparison: None.

CLINICAL DATA: Headache, sinus pressure

PARANASAL SINUSES - COMPLETE 3 + VIEW

[[person_name] pa]
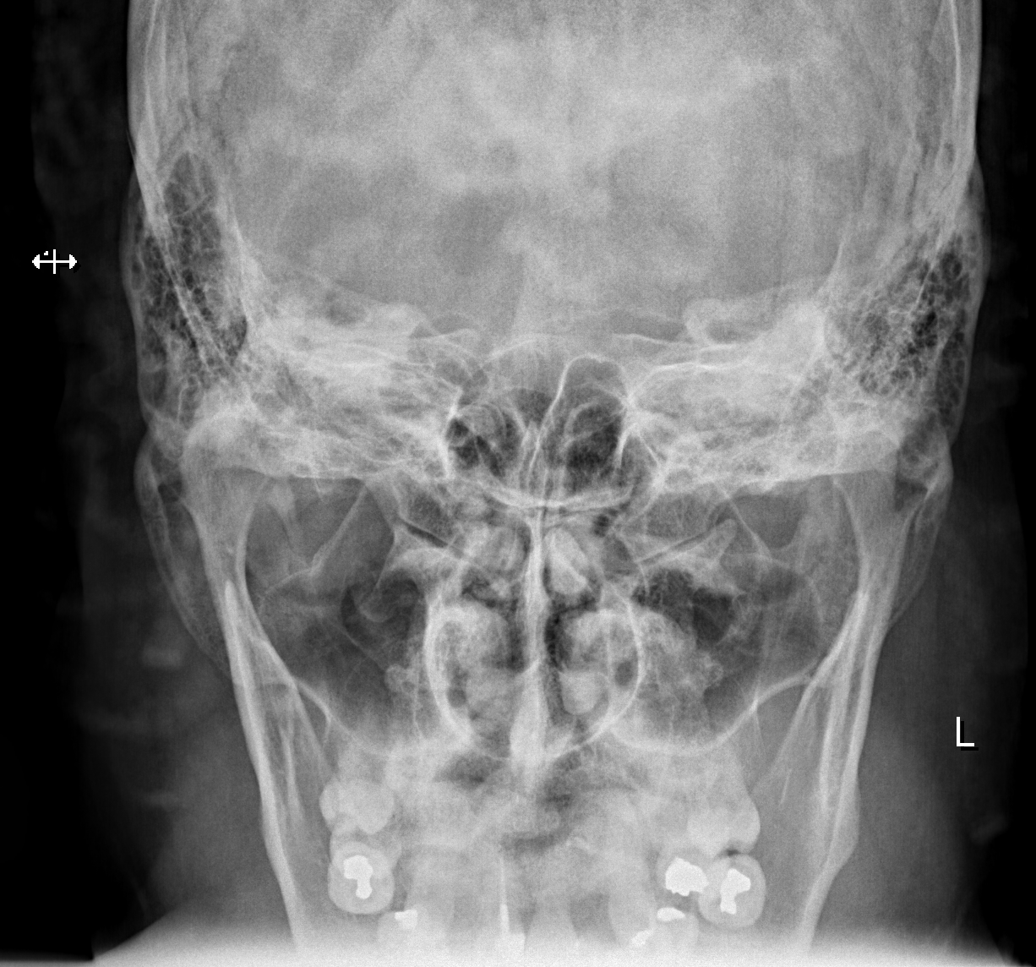

[w waters pa (1 of 2)]
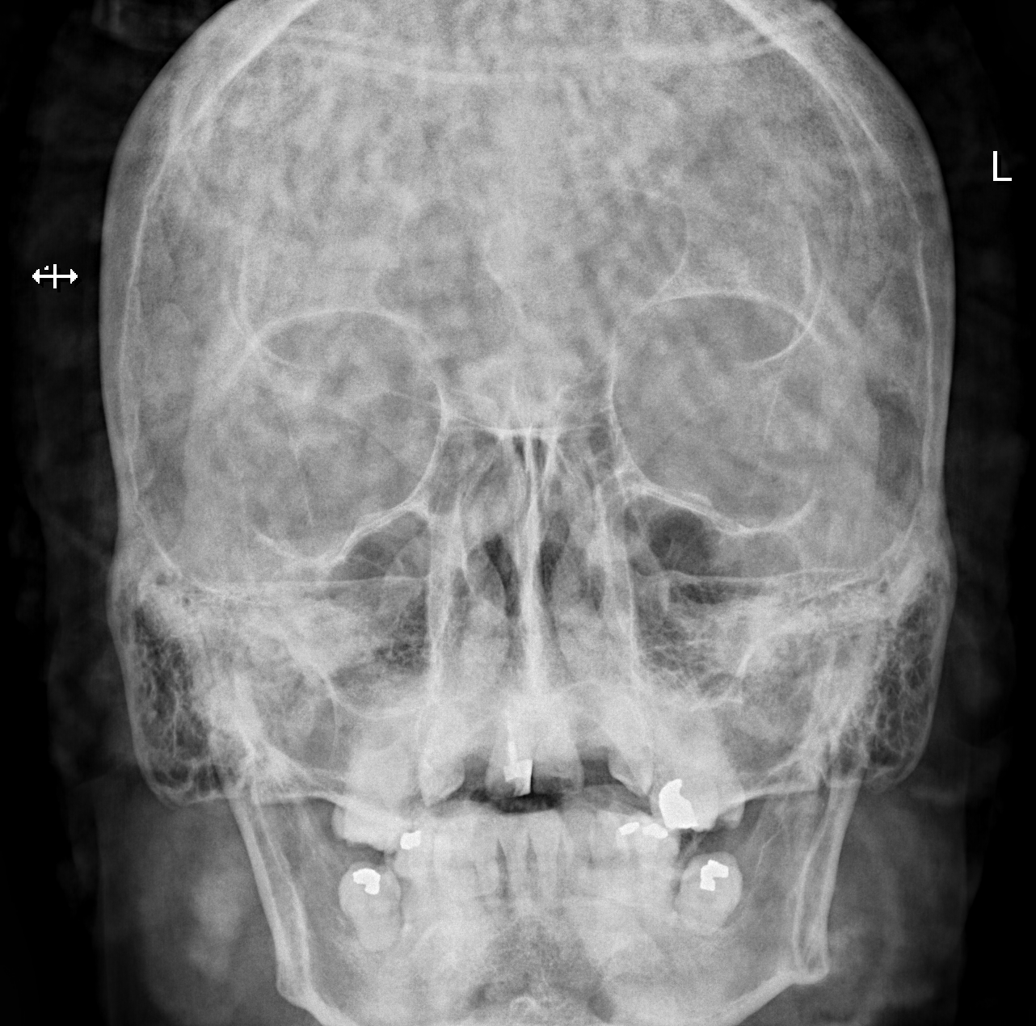

[w waters pa (2 of 2)]
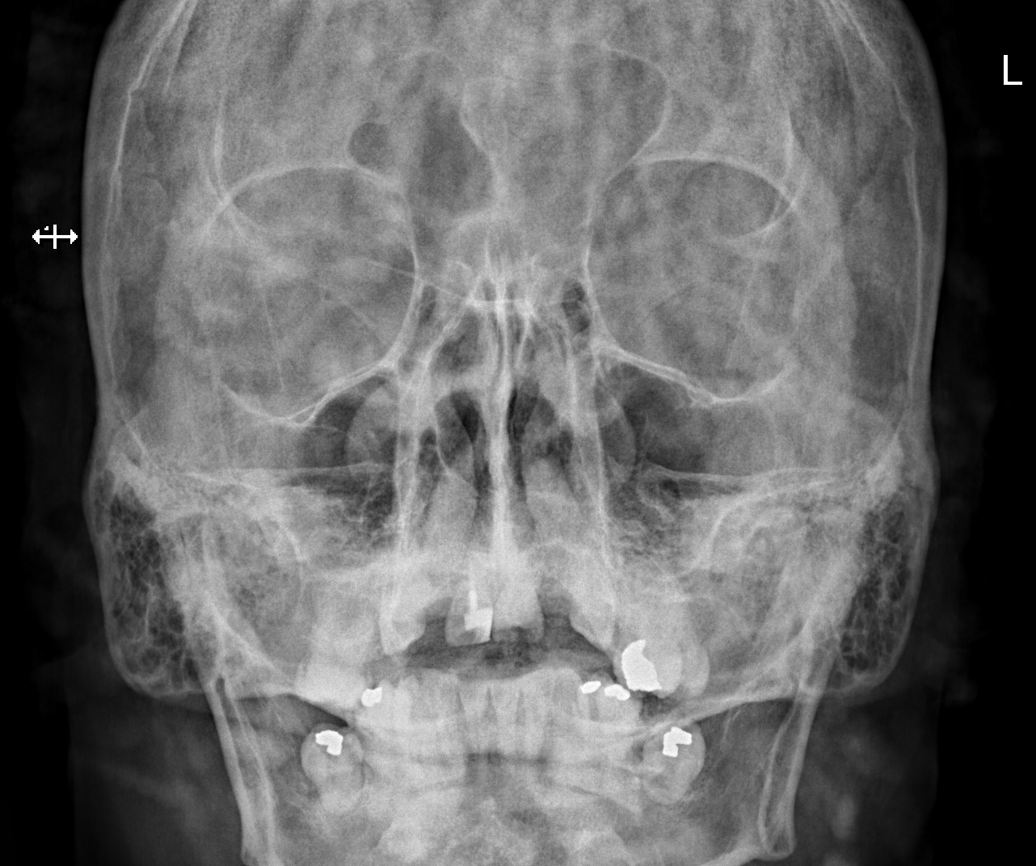

[w skull lat]
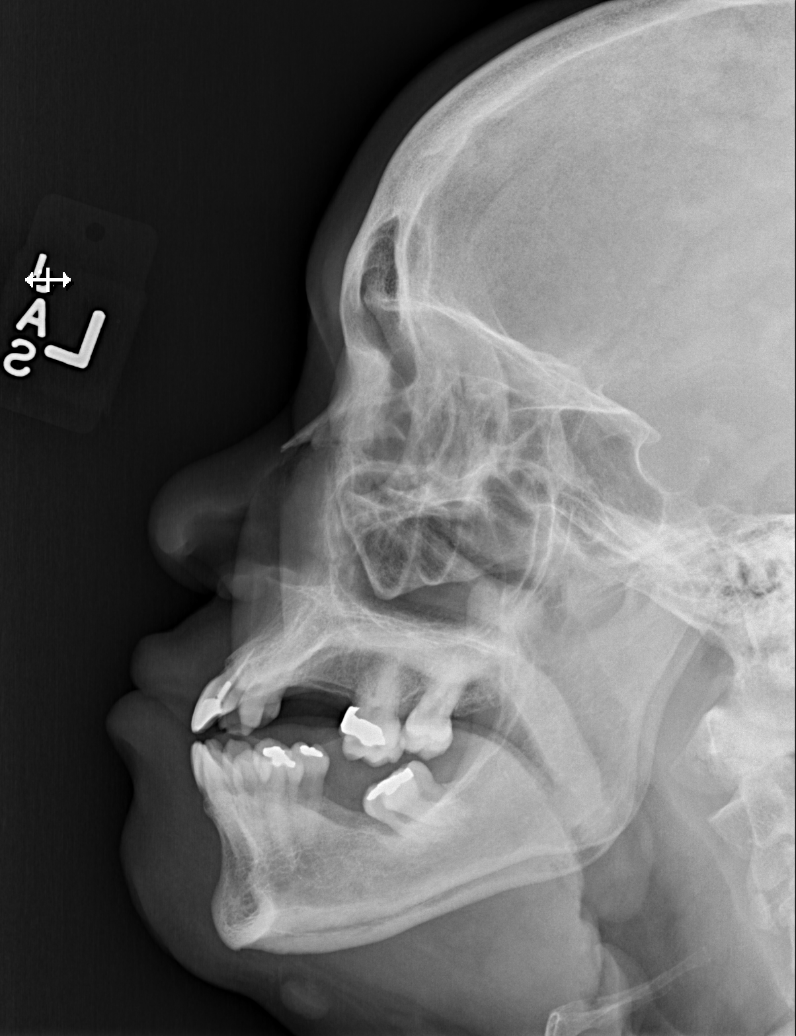

[w smv (1 of 2)]
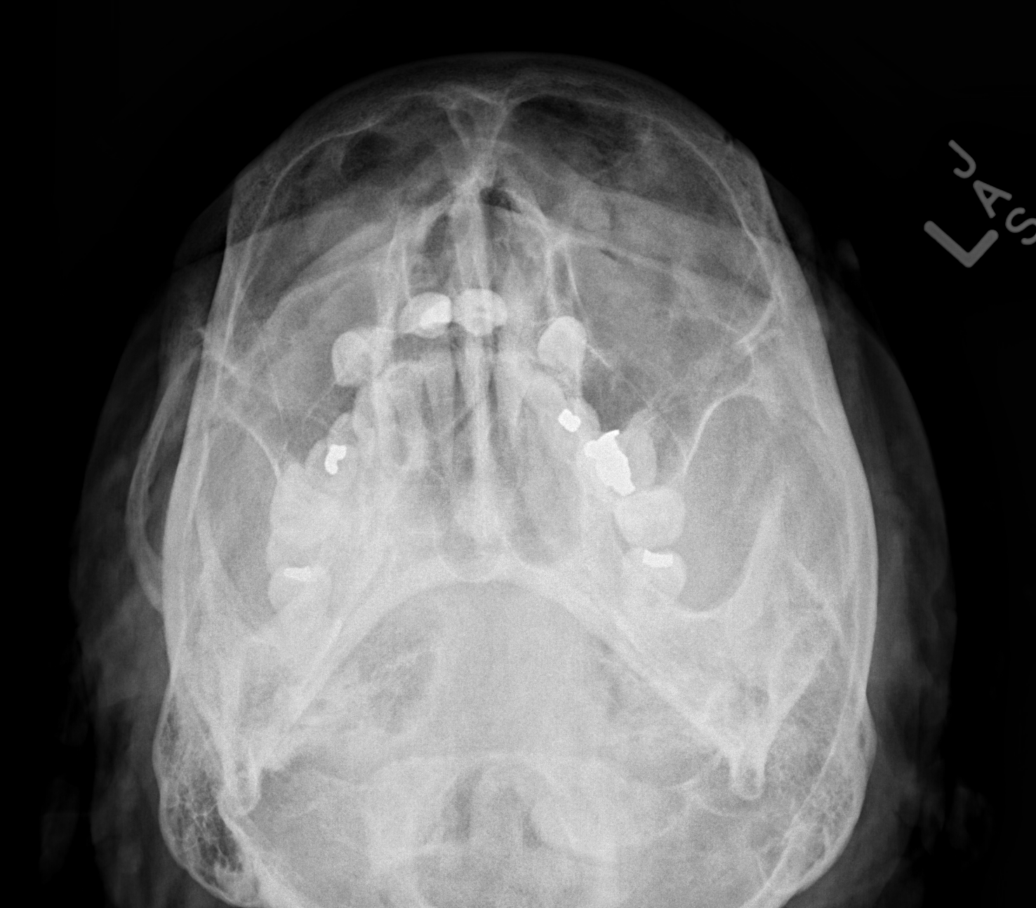

[w smv (2 of 2)]
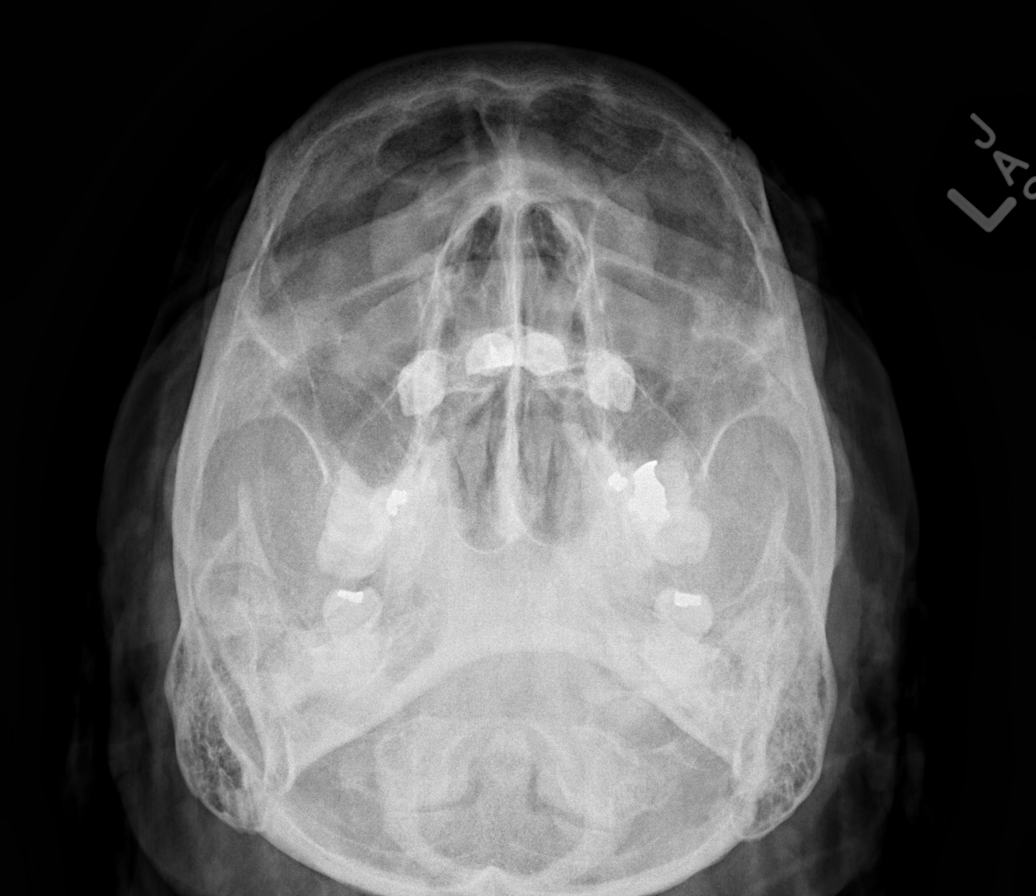

[6 of 6 positions shown; findings below may reference images not displayed]

FINDINGS: Frontal and maxillary sinuses appear clear.  No abnormal
opacity.  No air-fluid level.
IMPRESSION: No evidence of sinusitis radiographically.  If indicated, consider
CT of the sinuses for more detailed evaluation.

## 2015-08-13 DIAGNOSIS — E119 Type 2 diabetes mellitus without complications: Secondary | ICD-10-CM | POA: Diagnosis not present

## 2015-08-13 DIAGNOSIS — H11159 Pinguecula, unspecified eye: Secondary | ICD-10-CM | POA: Diagnosis not present

## 2015-08-13 DIAGNOSIS — I10 Essential (primary) hypertension: Secondary | ICD-10-CM | POA: Diagnosis not present

## 2015-08-13 DIAGNOSIS — H18419 Arcus senilis, unspecified eye: Secondary | ICD-10-CM | POA: Diagnosis not present

## 2015-08-13 LAB — HM DIABETES EYE EXAM

## 2015-09-17 ENCOUNTER — Telehealth: Payer: Self-pay | Admitting: Certified Registered Nurse Anesthetist

## 2015-09-17 NOTE — Telephone Encounter (Signed)
Please get this patient in with Dr. Etter Sjogren for a CPE and Flu shot as soon as possible please  Thanks Charlena Cross

## 2015-09-17 NOTE — Telephone Encounter (Signed)
Please schedule for yearly physical/gap closure with Dr. Etter Sjogren.  Also, please schedule patient for flu shot on nurse visit schedule.

## 2015-09-22 NOTE — Telephone Encounter (Signed)
Scheduled appt for 12/19

## 2015-10-05 ENCOUNTER — Encounter: Payer: Self-pay | Admitting: Family Medicine

## 2015-10-05 ENCOUNTER — Ambulatory Visit (INDEPENDENT_AMBULATORY_CARE_PROVIDER_SITE_OTHER): Payer: Medicare Other | Admitting: Family Medicine

## 2015-10-05 VITALS — BP 122/84 | HR 90 | Temp 98.0°F | Ht 63.0 in | Wt 216.8 lb

## 2015-10-05 DIAGNOSIS — Z23 Encounter for immunization: Secondary | ICD-10-CM | POA: Diagnosis not present

## 2015-10-05 DIAGNOSIS — E785 Hyperlipidemia, unspecified: Secondary | ICD-10-CM | POA: Diagnosis not present

## 2015-10-05 DIAGNOSIS — E1165 Type 2 diabetes mellitus with hyperglycemia: Secondary | ICD-10-CM | POA: Diagnosis not present

## 2015-10-05 DIAGNOSIS — Z1159 Encounter for screening for other viral diseases: Secondary | ICD-10-CM | POA: Diagnosis not present

## 2015-10-05 DIAGNOSIS — I10 Essential (primary) hypertension: Secondary | ICD-10-CM

## 2015-10-05 DIAGNOSIS — E1151 Type 2 diabetes mellitus with diabetic peripheral angiopathy without gangrene: Secondary | ICD-10-CM

## 2015-10-05 DIAGNOSIS — IMO0002 Reserved for concepts with insufficient information to code with codable children: Secondary | ICD-10-CM

## 2015-10-05 DIAGNOSIS — R8290 Unspecified abnormal findings in urine: Secondary | ICD-10-CM

## 2015-10-05 LAB — POCT URINALYSIS DIPSTICK
Bilirubin, UA: NEGATIVE
Blood, UA: NEGATIVE
Ketones, UA: NEGATIVE
Leukocytes, UA: NEGATIVE
Spec Grav, UA: 1.02
Urobilinogen, UA: 0.2
pH, UA: 6

## 2015-10-05 LAB — LIPID PANEL
Cholesterol: 167 mg/dL (ref 0–200)
HDL: 42.6 mg/dL (ref >39.00)
NonHDL: 123.9
Total CHOL/HDL Ratio: 4
Triglycerides: 207 mg/dL — ABNORMAL HIGH (ref 0.0–149.0)
VLDL: 41.4 mg/dL — ABNORMAL HIGH (ref 0.0–40.0)

## 2015-10-05 LAB — MICROALBUMIN / CREATININE URINE RATIO
Creatinine,U: 102.1 mg/dL
Microalb Creat Ratio: 2.2 mg/g (ref 0.0–30.0)
Microalb, Ur: 2.2 mg/dL — ABNORMAL HIGH (ref 0.0–1.9)

## 2015-10-05 LAB — HEMOGLOBIN A1C: Hgb A1c MFr Bld: 8.5 % — ABNORMAL HIGH (ref 4.6–6.5)

## 2015-10-05 LAB — LDL CHOLESTEROL, DIRECT: Direct LDL: 84 mg/dL

## 2015-10-05 NOTE — Progress Notes (Signed)
Patient ID: Heather Walker, female    DOB: 12-29-1960  Age: 54 y.o. MRN: [id number]    Subjective:  Subjective HPI Heather Walker presents for f/u dm, cholesterol and htn.    HYPERTENSION  Blood pressure range-105/  Chest pain- no      Dyspnea- no Lightheadedness- no   Edema- no Other side effects - no   Medication compliance: good Low salt diet- yes  DIABETES  Blood Sugar ranges-110-130  Polyuria- no New Visual problems- no Hypoglycemic symptoms- no Other side effects-no Medication compliance - good Last eye exam- nov Foot exam- today  HYPERLIPIDEMIA  Medication compliance- good RUQ pain- no  Muscle aches- no Other side effects-no     Review of Systems  Constitutional: Negative for diaphoresis, appetite change, fatigue and unexpected weight change.  Eyes: Negative for pain, redness and visual disturbance.  Respiratory: Negative for cough, chest tightness, shortness of breath and wheezing.   Cardiovascular: Negative for chest pain, palpitations and leg swelling.  Endocrine: Negative for cold intolerance, heat intolerance, polydipsia, polyphagia and polyuria.  Genitourinary: Negative for dysuria, frequency and difficulty urinating.  Neurological: Negative for dizziness, light-headedness, numbness and headaches.    History Past Medical History  Diagnosis Date  . Hypertension   . Depression   . Asthma   . Arthritis   . Heart murmur   . Diabetes mellitus without complication ([place])     She has past surgical history that includes Abdominal hysterectomy; Bladder suspension; Appendectomy; Cesarean section; and Tubal ligation.   Her family history includes Aneurysm in her sister; Asthma in her mother, sister, sister, and sister; Heart defect in her sister; Hypertension in her maternal grandmother, mother, and sister.She reports that she has never smoked. She has never used smokeless tobacco. She reports that she does not drink alcohol or use illicit drugs.  Current  Outpatient Prescriptions on File Prior to Visit  Medication Sig Dispense Refill  . albuterol (PROVENTIL HFA;VENTOLIN HFA) 108 (90 BASE) MCG/ACT inhaler Inhale 2 puffs into the lungs every 6 (six) hours as needed. For shortness of breath. 1 Inhaler 4  . albuterol (PROVENTIL) (2.5 MG/3ML) 0.083% nebulizer solution Take 3 mLs (2.5 mg total) by nebulization every 6 (six) hours as needed for wheezing or shortness of breath. 150 mL 1  . aspirin EC 81 MG EC tablet Take 1 tablet (81 mg total) by mouth daily.    . cloNIDine (CATAPRES) 0.2 MG tablet Take 1 tablet (0.2 mg total) by mouth 2 (two) times daily. 180 tablet 1  . cyclobenzaprine (FLEXERIL) 5 MG tablet Take 1 tablet (5 mg total) by mouth 3 (three) times daily as needed for muscle spasms. 30 tablet 1  . diclofenac (VOLTAREN) 75 MG EC tablet Take 1 tablet (75 mg total) by mouth 2 (two) times daily. 60 tablet 2  . glucose blood (BAYER CONTOUR NEXT TEST) test strip Check Blood Sugar once daily. Blood Glucose daily Dx: E11.9 100 each 5  . Lancets MISC Check Blood sugar once a day. 100 each 5  . metFORMIN (GLUCOPHAGE XR) 500 MG 24 hr tablet Take 1 tablet (500 mg total) by mouth daily with breakfast. 90 tablet 1  . Naproxen Sodium (ALEVE) 220 MG CAPS Take 440 mg by mouth daily as needed (pain).    . potassium chloride 20 MEQ TBCR Take 20 mEq by mouth daily. 30 tablet 0  . Respiratory Therapy Supplies (NEBULIZER COMPRESSOR) KIT 1 Device by Does not apply route as needed. 1 each 0  . traMADol (  ULTRAM) 50 MG tablet Take 1 tablet (50 mg total) by mouth every 6 (six) hours as needed for moderate pain. 90 tablet 0  . traZODone (DESYREL) 50 MG tablet Take 0.5-1 tablets (25-50 mg total) by mouth at bedtime as needed for sleep. 30 tablet 3  . triamterene-hydrochlorothiazide (MAXZIDE) 75-50 MG per tablet Take 1 tablet by mouth daily. 90 tablet 1   No current facility-administered medications on file prior to visit.     Objective:  Objective Physical Exam    Constitutional: She is oriented to person, place, and time. She appears well-developed and well-nourished.  HENT:  Head: Normocephalic and atraumatic.  Eyes: Conjunctivae and EOM are normal.  Neck: Normal range of motion. Neck supple. No JVD present. Carotid bruit is not present. No thyromegaly present.  Cardiovascular: Normal rate, regular rhythm and normal heart sounds.   No murmur heard. Pulmonary/Chest: Effort normal and breath sounds normal. No respiratory distress. She has no wheezes. She has no rales. She exhibits no tenderness.  Musculoskeletal: She exhibits no edema.  Neurological: She is alert and oriented to person, place, and time.  Psychiatric: She has a normal mood and affect.  Nursing note and vitals reviewed. Sensory exam of the foot is normal, tested with the monofilament. Good pulses, no lesions or ulcers, good peripheral pulses.  BP 122/84 mmHg  Pulse 90  Temp(Src) 98 F (36.7 C) (Oral)  Ht 5\' 3"  (1.6 m)  Wt 216 lb 12.8 oz (98.34 kg)  BMI 38.41 kg/m2  SpO2 96% Wt Readings from Last 3 Encounters:  10/05/15 216 lb 12.8 oz (98.34 kg)  12/16/14 216 lb (97.977 kg)  12/08/14 216 lb 6.4 oz (98.158 kg)     Lab Results  Component Value Date   WBC 10.1 12/12/2014   HGB 12.0 12/12/2014   HCT 35.2* 12/12/2014   PLT 245 12/12/2014   GLUCOSE 201* 12/12/2014   CHOL 178 10/13/2014   TRIG 177.0* 10/13/2014   HDL 44.60 10/13/2014   LDLCALC 98 10/13/2014   ALT 19 10/13/2014   AST 22 10/13/2014   NA 136 12/12/2014   K 2.8* 12/12/2014   CL 100 12/12/2014   CREATININE 1.56* 12/12/2014   BUN 12 12/12/2014   CO2 22 12/12/2014   TSH 1.124 12/08/2014   HGBA1C 9.4* 07/13/2014   MICROALBUR 1.0 07/25/2014    Dg Chest 2 View  12/24/2014  CLINICAL DATA:  Shortness of breath, nausea, poor appetite and weakness for 1 week. Initial encounter. EXAM: CHEST  2 VIEW COMPARISON:  12/12/2014. FINDINGS: Trachea is midline. Heart size normal. Streak atelectasis or scarring at the left  lung base. Lungs are otherwise clear. No pleural fluid. Flowing anterior osteophytosis in the thoracic spine. IMPRESSION: Streaky atelectasis or scarring at the left lung base. No acute findings. Electronically Signed   By: 12/14/2014 M.D.   On: 12/24/2014 08:16     Assessment & Plan:  Plan I have discontinued Ms. Rodocker's ondansetron, oxyCODONE-acetaminophen, insulin lispro, levofloxacin, doxycycline, lisinopril, pantoprazole, and sulfamethoxazole-trimethoprim. I am also having her maintain her aspirin, Potassium Chloride ER, Naproxen Sodium, diclofenac, cyclobenzaprine, traZODone, albuterol, Nebulizer Compressor, cloNIDine, triamterene-hydrochlorothiazide, metFORMIN, albuterol, traMADol, glucose blood, and Lancets.  No orders of the defined types were placed in this encounter.    Problem List Items Addressed This Visit    Essential hypertension   Relevant Orders   Lipid panel   POCT urinalysis dipstick (Completed)   Microalbumin / creatinine urine ratio    Other Visit Diagnoses    DM (diabetes  mellitus) type II uncontrolled, periph vascular disorder (HCC)    -  Primary    Relevant Orders    Lipid panel    Microalbumin / creatinine urine ratio    Hemoglobin A1c    Comp Met (CMET)    Need for immunization against influenza        Relevant Orders    Flu Vaccine QUAD 36+ mos IM (Fluarix) (Completed)    Hyperlipidemia LDL goal <70        Relevant Orders    Microalbumin / creatinine urine ratio    Need for hepatitis C screening test        Relevant Orders    Hepatitis C antibody    Abnormal urine           Follow-up: Return in about 6 months (around 04/04/2016) for annual exam, fasting.  Garnet Koyanagi, DO

## 2015-10-05 NOTE — Patient Instructions (Signed)

## 2015-10-05 NOTE — Progress Notes (Signed)
Pre visit review using our clinic review tool, if applicable. No additional management support is needed unless otherwise documented below in the visit note. 

## 2015-10-06 LAB — HEPATITIS C ANTIBODY: HCV Ab: NEGATIVE

## 2015-10-08 ENCOUNTER — Other Ambulatory Visit: Payer: Self-pay | Admitting: Family Medicine

## 2015-10-08 NOTE — Telephone Encounter (Signed)
Last seen 10/05/15 and filled 03/02/15 #90  Please advise     KP

## 2015-10-13 LAB — COMPREHENSIVE METABOLIC PANEL
ALT: 15 U/L (ref 0–35)
AST: 15 U/L (ref 0–37)
Albumin: 3.8 g/dL (ref 3.5–5.2)
Alkaline Phosphatase: 85 U/L (ref 39–117)
BUN: 12 mg/dL (ref 6–23)
CO2: 29 mEq/L (ref 19–32)
Calcium: 9.2 mg/dL (ref 8.4–10.5)
Chloride: 99 mEq/L (ref 96–112)
Creatinine, Ser: 0.82 mg/dL (ref 0.40–1.20)
GFR: 93.81 mL/min (ref >60.00)
Glucose, Bld: 220 mg/dL — ABNORMAL HIGH (ref 70–99)
Potassium: 3.1 mEq/L — ABNORMAL LOW (ref 3.5–5.1)
Sodium: 138 mEq/L (ref 135–145)
Total Bilirubin: 0.3 mg/dL (ref 0.2–1.2)
Total Protein: 7.3 g/dL (ref 6.0–8.3)

## 2015-10-14 ENCOUNTER — Telehealth: Payer: Self-pay

## 2015-10-14 DIAGNOSIS — E1165 Type 2 diabetes mellitus with hyperglycemia: Principal | ICD-10-CM

## 2015-10-14 DIAGNOSIS — IMO0001 Reserved for inherently not codable concepts without codable children: Secondary | ICD-10-CM

## 2015-10-14 MED ORDER — POTASSIUM CHLORIDE ER 20 MEQ PO TBCR: 40.0000 meq | EXTENDED_RELEASE_TABLET | Freq: Every day | ORAL | Status: DC

## 2015-10-14 MED ORDER — METFORMIN HCL ER 500 MG PO TB24: 1000.0000 mg | ORAL_TABLET | Freq: Every day | ORAL | Status: DC

## 2015-10-14 NOTE — Telephone Encounter (Signed)
-----   Message from Rosalita Chessman, DO sent at 10/10/2015 10:05 PM EST ----- Dm not controlled--- inc metformin xr to 500 mg 2 po qpm#60  2 refills Potassium is low--- increase to 2 a day Recheck 3 months-----lipid, cmp, hgba1c

## 2015-10-14 NOTE — Telephone Encounter (Addendum)
RX dose changed due to labs new orders sent to pharmacy, patient informed of new Rx and has agreed to follow medication change.

## 2015-11-17 ENCOUNTER — Other Ambulatory Visit: Payer: Self-pay | Admitting: Family Medicine

## 2015-11-19 ENCOUNTER — Telehealth: Payer: Self-pay | Admitting: Family Medicine

## 2015-11-19 MED ORDER — CLONIDINE HCL 0.2 MG PO TABS: 0.2000 mg | ORAL_TABLET | Freq: Two times a day (BID) | ORAL | Status: DC

## 2015-11-19 NOTE — Telephone Encounter (Signed)
Caller name: Bryelle    Relationship to patient: Self   Can be reached: [phone]  Pharmacy: [facility], [place]  Reason for call: Pt called in because she says that she requested a refill on her CloNIDine and was told by pharmacy that it was refused by pcp. Pt would like to fu and know why because she says that all of her other meds were filled and also she was last seen in Dec.

## 2015-11-19 NOTE — Telephone Encounter (Signed)
I called the pharmacy and the Rx was deleted at the pharmacy during a transfer and they needed it to be re-faxed.  I left a message on the patient's VM advising the Rx has been faxed.    KP

## 2015-12-25 ENCOUNTER — Emergency Department (HOSPITAL_COMMUNITY)
Admission: EM | Admit: 2015-12-25 | Discharge: 2015-12-26 | Disposition: A | Discharge status: Home / Self Care | Payer: Medicare Other | Attending: Emergency Medicine | Admitting: Emergency Medicine

## 2015-12-25 ENCOUNTER — Encounter (HOSPITAL_COMMUNITY): Payer: Self-pay | Admitting: Emergency Medicine

## 2015-12-25 DIAGNOSIS — E876 Hypokalemia: Secondary | ICD-10-CM | POA: Insufficient documentation

## 2015-12-25 DIAGNOSIS — R509 Fever, unspecified: Secondary | ICD-10-CM | POA: Diagnosis present

## 2015-12-25 DIAGNOSIS — Z79899 Other long term (current) drug therapy: Secondary | ICD-10-CM | POA: Insufficient documentation

## 2015-12-25 DIAGNOSIS — K625 Hemorrhage of anus and rectum: Secondary | ICD-10-CM | POA: Diagnosis not present

## 2015-12-25 DIAGNOSIS — F329 Major depressive disorder, single episode, unspecified: Secondary | ICD-10-CM | POA: Insufficient documentation

## 2015-12-25 DIAGNOSIS — R011 Cardiac murmur, unspecified: Secondary | ICD-10-CM | POA: Diagnosis not present

## 2015-12-25 DIAGNOSIS — M1990 Unspecified osteoarthritis, unspecified site: Secondary | ICD-10-CM | POA: Insufficient documentation

## 2015-12-25 DIAGNOSIS — M791 Myalgia: Secondary | ICD-10-CM | POA: Insufficient documentation

## 2015-12-25 DIAGNOSIS — E119 Type 2 diabetes mellitus without complications: Secondary | ICD-10-CM | POA: Diagnosis not present

## 2015-12-25 DIAGNOSIS — R197 Diarrhea, unspecified: Secondary | ICD-10-CM | POA: Diagnosis not present

## 2015-12-25 DIAGNOSIS — J45901 Unspecified asthma with (acute) exacerbation: Secondary | ICD-10-CM | POA: Diagnosis not present

## 2015-12-25 DIAGNOSIS — R05 Cough: Secondary | ICD-10-CM | POA: Diagnosis not present

## 2015-12-25 DIAGNOSIS — R630 Anorexia: Secondary | ICD-10-CM | POA: Insufficient documentation

## 2015-12-25 DIAGNOSIS — N390 Urinary tract infection, site not specified: Secondary | ICD-10-CM

## 2015-12-25 DIAGNOSIS — Z7982 Long term (current) use of aspirin: Secondary | ICD-10-CM | POA: Insufficient documentation

## 2015-12-25 DIAGNOSIS — I10 Essential (primary) hypertension: Secondary | ICD-10-CM | POA: Insufficient documentation

## 2015-12-25 DIAGNOSIS — R059 Cough, unspecified: Secondary | ICD-10-CM

## 2015-12-25 DIAGNOSIS — R51 Headache: Secondary | ICD-10-CM | POA: Insufficient documentation

## 2015-12-25 LAB — URINALYSIS, ROUTINE W REFLEX MICROSCOPIC
Bilirubin Urine: NEGATIVE
Glucose, UA: NEGATIVE mg/dL
Hgb urine dipstick: NEGATIVE
Ketones, ur: NEGATIVE mg/dL
Nitrite: NEGATIVE
Protein, ur: 30 mg/dL — AB
Specific Gravity, Urine: 1.015 (ref 1.005–1.030)
pH: 6 (ref 5.0–8.0)

## 2015-12-25 LAB — COMPREHENSIVE METABOLIC PANEL
ALT: 20 U/L (ref 14–54)
AST: 30 U/L (ref 15–41)
Albumin: 3.6 g/dL (ref 3.5–5.0)
Alkaline Phosphatase: 88 U/L (ref 38–126)
Anion gap: 12 (ref 5–15)
BUN: <5 mg/dL — ABNORMAL LOW (ref 6–20)
CO2: 27 mmol/L (ref 22–32)
Calcium: 8.5 mg/dL — ABNORMAL LOW (ref 8.9–10.3)
Chloride: 99 mmol/L — ABNORMAL LOW (ref 101–111)
Creatinine, Ser: 0.9 mg/dL (ref 0.44–1.00)
GFR calc Af Amer: >60 mL/min (ref >60)
GFR calc non Af Amer: >60 mL/min (ref >60)
Glucose, Bld: 178 mg/dL — ABNORMAL HIGH (ref 65–99)
Potassium: 2.8 mmol/L — ABNORMAL LOW (ref 3.5–5.1)
Sodium: 138 mmol/L (ref 135–145)
Total Bilirubin: 0.7 mg/dL (ref 0.3–1.2)
Total Protein: 7.6 g/dL (ref 6.5–8.1)

## 2015-12-25 LAB — CBC
HCT: 41.1 % (ref 36.0–46.0)
Hemoglobin: 13.6 g/dL (ref 12.0–15.0)
MCH: 27.4 pg (ref 26.0–34.0)
MCHC: 33.1 g/dL (ref 30.0–36.0)
MCV: 82.9 fL (ref 78.0–100.0)
Platelets: 189 10*3/uL (ref 150–400)
RBC: 4.96 MIL/uL (ref 3.87–5.11)
RDW: 14.5 % (ref 11.5–15.5)
WBC: 4.1 10*3/uL (ref 4.0–10.5)

## 2015-12-25 LAB — TYPE AND SCREEN
ABO/RH(D): O POS
Antibody Screen: NEGATIVE

## 2015-12-25 LAB — URINE MICROSCOPIC-ADD ON: RBC / HPF: NONE SEEN RBC/hpf (ref 0–5)

## 2015-12-25 LAB — LIPASE, BLOOD: Lipase: 37 U/L (ref 11–51)

## 2015-12-25 LAB — ABO/RH: ABO/RH(D): O POS

## 2015-12-25 MED ORDER — DEXTROSE 5 % IV SOLN
1.0000 g | Freq: Once | INTRAVENOUS | Status: AC
  Administered 2015-12-25: 1 g via INTRAVENOUS
  Filled 2015-12-25: qty 10

## 2015-12-25 MED ORDER — POTASSIUM CHLORIDE 10 MEQ/100ML IV SOLN
10.0000 meq | Freq: Once | INTRAVENOUS | Status: AC
  Administered 2015-12-25: 10 meq via INTRAVENOUS
  Filled 2015-12-25: qty 100

## 2015-12-25 NOTE — ED Notes (Signed)
Pt reports not taking HTN medications for several days now due to taking OTC medications for symptoms.

## 2015-12-25 NOTE — ED Notes (Signed)
Pt from home with c/o fever, chills, nasal congestion, body aches, headaches, sore throat starting this past Sat.  Pt denies N/V.  Reports diarrhea starting this past Wed.  Pt states she started noticing blood passing with BM and noticed she was passing blood clots starting today.  NAD, A&O.

## 2015-12-25 NOTE — ED Provider Notes (Signed)
CSN: [id number]     Arrival date & time 12/25/15  1618 History  By signing my name below, I, Rowan Blase, attest that this documentation has been prepared under the direction and in the presence of Noemi Chapel, MD . Electronically Signed: Rowan Blase, Scribe. 12/25/2015. 11:57 PM.  Chief Complaint  Patient presents with  . Flu Like Symptoms with diarrhea   . Rectal Bleeding   The history is provided by the patient. No language interpreter was used.    HPI Comments:  Heather Walker is a 55 y.o. female with PMHx of asthma who presents to the Emergency Department complaining of gradual onset persistent cough, moderate generalized body aches, chills, headache, and intermittent subjective fever onset 5 days ago, improving slightly in the past two days. Pt notes she feels wheezy. Pt reports associated burning and frequency. She also notes decreased PO intake for the past two days. No alleviating factors noted. Denies h/o MI, CVA, or kidney problems.  Past Medical History  Diagnosis Date  . Hypertension   . Depression   . Asthma   . Arthritis   . Heart murmur   . Diabetes mellitus without complication [facility])    Past Surgical History  Procedure Laterality Date  . Abdominal hysterectomy    . Bladder suspension    . Appendectomy    . Cesarean section      3 previous  . Tubal ligation     Family History  Problem Relation Age of Onset  . Hypertension Mother   . Asthma Mother   . Hypertension Sister   . Asthma Sister   . Hypertension Maternal Grandmother   . Heart defect Sister   . Asthma Sister   . Aneurysm Sister   . Asthma Sister    Social History  Substance Use Topics  . Smoking status: Never Smoker   . Smokeless tobacco: Never Used  . Alcohol Use: No   OB History    Gravida Para Term Preterm AB TAB SAB Ectopic Multiple Living   _0 Review of Systems  Constitutional: Positive for fever (subjective ), chills and appetite change.  Respiratory:  Positive for cough.   Genitourinary: Positive for dysuria and frequency.  Musculoskeletal: Positive for myalgias.  Neurological: Positive for headaches.  All other systems reviewed and are negative.  Allergies  Codeine  Home Medications   Prior to Admission medications   Medication Sig Start Date End Date Taking? Authorizing Provider  albuterol (PROVENTIL HFA;VENTOLIN HFA) 108 (90 BASE) MCG/ACT inhaler Inhale 2 puffs into the lungs every 6 (six) hours as needed. For shortness of breath. 02/27/15  Yes Yvonne R Lowne, DO  albuterol (PROVENTIL) (2.5 MG/3ML) 0.083% nebulizer solution Take 3 mLs (2.5 mg total) by nebulization every 6 (six) hours as needed for wheezing or shortness of breath. 10/16/14  Yes Rosalita Chessman, DO  aspirin EC 81 MG EC tablet Take 1 tablet (81 mg total) by mouth daily. 07/15/14  Yes Eugenie Filler, MD  cloNIDine (CATAPRES) 0.2 MG tablet Take 1 tablet (0.2 mg total) by mouth 2 (two) times daily. 11/19/15  Yes Rosalita Chessman, DO  metFORMIN (GLUCOPHAGE XR) 500 MG 24 hr tablet Take 2 tablets (1,000 mg total) by mouth daily with breakfast. 10/14/15  Yes Rosalita Chessman, DO  Potassium Chloride ER 20 MEQ TBCR Take 40 mEq by mouth daily. 10/14/15  Yes Alferd Apa Lowne, DO  traMADol (ULTRAM) 50 MG  tablet take 1 tablet by mouth every 6 hours if needed for pain 10/08/15  Yes Yvonne R Lowne, DO  triamterene-hydrochlorothiazide (MAXZIDE) 75-50 MG per tablet Take 1 tablet by mouth daily. 02/27/15  Yes Yvonne R Lowne, DO  benzonatate (TESSALON) 100 MG capsule Take 1 capsule (100 mg total) by mouth every 8 (eight) hours. 12/26/15   Noemi Chapel, MD  cephALEXin (KEFLEX) 500 MG capsule Take 1 capsule (500 mg total) by mouth 4 (four) times daily. 12/26/15   Noemi Chapel, MD  cyclobenzaprine (FLEXERIL) 5 MG tablet Take 1 tablet (5 mg total) by mouth 3 (three) times daily as needed for muscle spasms. Patient not taking: Reported on 12/26/2015 09/18/14   Rosalita Chessman, DO  diclofenac (VOLTAREN) 75 MG  EC tablet Take 1 tablet (75 mg total) by mouth 2 (two) times daily. Patient not taking: Reported on 12/26/2015 09/18/14   Alferd Apa Lowne, DO  glucose blood (BAYER CONTOUR NEXT TEST) test strip Check Blood Sugar once daily. Blood Glucose daily Dx: E11.9 03/18/15   Rosalita Chessman, DO  Lancets MISC Check Blood sugar once a day. 03/18/15   Rosalita Chessman, DO  Respiratory Therapy Supplies (NEBULIZER COMPRESSOR) KIT 1 Device by Does not apply route as needed. 10/20/14   Rosalita Chessman, DO  traZODone (DESYREL) 50 MG tablet Take 0.5-1 tablets (25-50 mg total) by mouth at bedtime as needed for sleep. Patient not taking: Reported on 12/26/2015 10/03/14   Alferd Apa Lowne, DO   BP 145/89 mmHg  Pulse 78  Temp(Src) 98.5 F (36.9 C) (Oral)  Resp 19  Ht _0  (1.6 m)  Wt 210 lb (95.255 kg)  BMI 37.21 kg/m2  SpO2 92% Physical Exam  Constitutional: She appears well-developed and well-nourished. No distress.  HENT:  Head: Normocephalic and atraumatic.  Mouth/Throat: Oropharynx is clear and moist. No oropharyngeal exudate.  Eyes: Conjunctivae and EOM are normal. Pupils are equal, round, and reactive to light. Right eye exhibits no discharge. Left eye exhibits no discharge. No scleral icterus.  Neck: Normal range of motion. Neck supple. No JVD present. No thyromegaly present.  Cardiovascular: Normal rate, regular rhythm, normal heart sounds and intact distal pulses.  Exam reveals no gallop and no friction rub.   No murmur heard. Pulmonary/Chest: Effort normal. No respiratory distress. She has wheezes. She has no rales.  Slight expiratory wheezes   Abdominal: Soft. Bowel sounds are normal. She exhibits no distension and no mass. There is no tenderness.  Musculoskeletal: Normal range of motion. She exhibits no edema or tenderness.  Lymphadenopathy:    She has no cervical adenopathy.  Neurological: She is alert. Coordination normal.  Skin: Skin is warm and dry. No rash noted. She is not diaphoretic. No erythema.   Psychiatric: She has a normal mood and affect. Her behavior is normal.  Nursing note and vitals reviewed.  ED Course  Procedures  DIAGNOSTIC STUDIES:  Oxygen Saturation is 94% on RA, adequate by my interpretation.    COORDINATION OF CARE:  11:10 PM Discussed lab results with pt. Will administer antibiotic and potassium. Discussed treatment plan with pt at bedside and pt agreed to plan.  Labs Review Labs Reviewed  COMPREHENSIVE METABOLIC PANEL - Abnormal; Notable for the following:    Potassium 2.8 (*)    Chloride 99 (*)    Glucose, Bld 178 (*)    BUN <5 (*)    Calcium 8.5 (*)    All other components within normal limits  URINALYSIS, ROUTINE W REFLEX  MICROSCOPIC (NOT AT [facility]) - Abnormal; Notable for the following:    APPearance CLOUDY (*)    Protein, ur 30 (*)    Leukocytes, UA MODERATE (*)    All other components within normal limits  URINE MICROSCOPIC-ADD ON - Abnormal; Notable for the following:    Squamous Epithelial / LPF 0-5 (*)    Bacteria, UA MANY (*)    Casts HYALINE CASTS (*)    All other components within normal limits  LIPASE, BLOOD  CBC  POC OCCULT BLOOD, ED  TYPE AND SCREEN  ABO/RH    Imaging Review No results found. I have personally reviewed and evaluated these images and lab results as part of my medical decision-making.   MDM   Final diagnoses:  UTI (lower urinary tract infection)  Cough  Hypokalemia    I personally performed the services described in this documentation, which was scribed in my presence. The recorded information has been reviewed and is accurate.   K replaced with IV K - pt informed and will f/u for repeat testing  The patient's vital signs have been in a relatively normal range, she had an isolated hypoxic measurement at 92% however on my exam in the room on room air she is 98% and unlabored. She has received an albuterol treatment, her urinalysis does show signs of infection discussed Keflex was started, she has no systemic  symptoms at this time, no signs of sepsis, no leukocytosis, and no renal dysfunction. We'll prescribe Keflex, the patient appears stable for discharge.  Pt expressed understanding to my verbal and written d/c instructions.  Meds given in ED:  Medications  potassium chloride 10 mEq in 100 mL IVPB (0 mEq Intravenous Stopped 12/26/15 0100)  cefTRIAXone (ROCEPHIN) 1 g in dextrose 5 % 50 mL IVPB (0 g Intravenous Stopped 12/26/15 0016)  albuterol (PROVENTIL) (2.5 MG/3ML) 0.083% nebulizer solution 5 mg (5 mg Nebulization Given 12/26/15 0100)    New Prescriptions   BENZONATATE (TESSALON) 100 MG CAPSULE    Take 1 capsule (100 mg total) by mouth every 8 (eight) hours.   CEPHALEXIN (KEFLEX) 500 MG CAPSULE    Take 1 capsule (500 mg total) by mouth 4 (four) times daily.       Noemi Chapel, MD 12/26/15 [phone]

## 2015-12-26 MED ORDER — CEPHALEXIN 500 MG PO CAPS: 500.0000 mg | ORAL_CAPSULE | Freq: Four times a day (QID) | ORAL | Status: DC

## 2015-12-26 MED ORDER — ALBUTEROL SULFATE (2.5 MG/3ML) 0.083% IN NEBU
5.0000 mg | INHALATION_SOLUTION | Freq: Once | RESPIRATORY_TRACT | Status: AC
  Administered 2015-12-26: 5 mg via RESPIRATORY_TRACT
  Filled 2015-12-26: qty 6

## 2015-12-26 MED ORDER — BENZONATATE 100 MG PO CAPS: 100.0000 mg | ORAL_CAPSULE | Freq: Three times a day (TID) | ORAL | Status: DC

## 2015-12-26 NOTE — Discharge Instructions (Signed)

## 2016-01-22 ENCOUNTER — Other Ambulatory Visit: Payer: Self-pay

## 2016-01-22 MED ORDER — GLUCOSE BLOOD VI STRP: ORAL_STRIP | Status: DC

## 2016-02-16 ENCOUNTER — Other Ambulatory Visit: Payer: Self-pay | Admitting: Family Medicine

## 2016-02-18 ENCOUNTER — Emergency Department (HOSPITAL_COMMUNITY)
Admission: EM | Admit: 2016-02-18 | Discharge: 2016-02-18 | Disposition: A | Discharge status: Home / Self Care | Payer: Medicare Other | Attending: Emergency Medicine | Admitting: Emergency Medicine

## 2016-02-18 ENCOUNTER — Emergency Department (HOSPITAL_COMMUNITY): Payer: Medicare Other

## 2016-02-18 ENCOUNTER — Encounter (HOSPITAL_COMMUNITY): Payer: Self-pay

## 2016-02-18 DIAGNOSIS — R079 Chest pain, unspecified: Secondary | ICD-10-CM

## 2016-02-18 DIAGNOSIS — Z7982 Long term (current) use of aspirin: Secondary | ICD-10-CM | POA: Insufficient documentation

## 2016-02-18 DIAGNOSIS — E119 Type 2 diabetes mellitus without complications: Secondary | ICD-10-CM | POA: Insufficient documentation

## 2016-02-18 DIAGNOSIS — Z7984 Long term (current) use of oral hypoglycemic drugs: Secondary | ICD-10-CM | POA: Insufficient documentation

## 2016-02-18 DIAGNOSIS — R0602 Shortness of breath: Secondary | ICD-10-CM | POA: Diagnosis not present

## 2016-02-18 DIAGNOSIS — Z79899 Other long term (current) drug therapy: Secondary | ICD-10-CM | POA: Insufficient documentation

## 2016-02-18 DIAGNOSIS — R011 Cardiac murmur, unspecified: Secondary | ICD-10-CM | POA: Insufficient documentation

## 2016-02-18 DIAGNOSIS — I10 Essential (primary) hypertension: Secondary | ICD-10-CM | POA: Insufficient documentation

## 2016-02-18 DIAGNOSIS — J45901 Unspecified asthma with (acute) exacerbation: Secondary | ICD-10-CM | POA: Diagnosis not present

## 2016-02-18 DIAGNOSIS — R0789 Other chest pain: Secondary | ICD-10-CM | POA: Diagnosis not present

## 2016-02-18 DIAGNOSIS — M1990 Unspecified osteoarthritis, unspecified site: Secondary | ICD-10-CM | POA: Insufficient documentation

## 2016-02-18 DIAGNOSIS — F329 Major depressive disorder, single episode, unspecified: Secondary | ICD-10-CM | POA: Insufficient documentation

## 2016-02-18 LAB — BASIC METABOLIC PANEL
Anion gap: 13 (ref 5–15)
BUN: 14 mg/dL (ref 6–20)
CO2: 26 mmol/L (ref 22–32)
Calcium: 9.5 mg/dL (ref 8.9–10.3)
Chloride: 98 mmol/L — ABNORMAL LOW (ref 101–111)
Creatinine, Ser: 1.17 mg/dL — ABNORMAL HIGH (ref 0.44–1.00)
GFR calc Af Amer: >60 mL/min (ref >60)
GFR calc non Af Amer: 52 mL/min — ABNORMAL LOW (ref >60)
Glucose, Bld: 188 mg/dL — ABNORMAL HIGH (ref 65–99)
Potassium: 3.1 mmol/L — ABNORMAL LOW (ref 3.5–5.1)
Sodium: 137 mmol/L (ref 135–145)

## 2016-02-18 LAB — I-STAT TROPONIN, ED: Troponin i, poc: 0 ng/mL (ref 0.00–0.08)

## 2016-02-18 LAB — CBC
HCT: 37.8 % (ref 36.0–46.0)
Hemoglobin: 12.6 g/dL (ref 12.0–15.0)
MCH: 27.9 pg (ref 26.0–34.0)
MCHC: 33.3 g/dL (ref 30.0–36.0)
MCV: 83.6 fL (ref 78.0–100.0)
Platelets: 262 10*3/uL (ref 150–400)
RBC: 4.52 MIL/uL (ref 3.87–5.11)
RDW: 14.4 % (ref 11.5–15.5)
WBC: 5.6 10*3/uL (ref 4.0–10.5)

## 2016-02-18 MED ORDER — SODIUM CHLORIDE 0.9 % IV SOLN
Freq: Once | INTRAVENOUS | Status: AC
  Administered 2016-02-18: 20:00:00 via INTRAVENOUS

## 2016-02-18 MED ORDER — KETOROLAC TROMETHAMINE 30 MG/ML IJ SOLN
30.0000 mg | Freq: Once | INTRAMUSCULAR | Status: AC
  Administered 2016-02-18: 30 mg via INTRAVENOUS
  Filled 2016-02-18: qty 1

## 2016-02-18 NOTE — Discharge Instructions (Signed)
As discussed, your evaluation today has been largely reassuring.  But, it is important that you monitor your condition carefully, and do not hesitate to return to the ED if you develop new, or concerning changes in your condition. ? ?Otherwise, please follow-up with your physician for appropriate ongoing care. ? ?

## 2016-02-18 NOTE — ED Notes (Signed)
Patient transported to X-ray 

## 2016-02-18 NOTE — ED Notes (Addendum)
Pt from home with chest pain in the right chest. Pain started around 1230 this afternoon and has gradually got worse. Pt denies N/V and has mild SOB. Pt given 324mg  ASA and 1 nitro with EMS

## 2016-02-18 NOTE — ED Provider Notes (Signed)
CSN: [id number]     Arrival date & time 02/18/16  1908 History   First MD Initiated Contact with Patient 02/18/16 1908     Chief Complaint  Patient presents with  . Chest Pain     (Consider location/radiation/quality/duration/timing/severity/associated sxs/prior Treatment) HPI Patient presents with concern of chest pain. The pain began today about 5 hours ago. Since onset the pain has been persistent, though the severity is waxing, waning. No clear precipitant, nor any clear alleviating or exacerbating factors. Mild associated dyspnea, when the pain is severe, otherwise no dyspnea, no lightheadedness, no significant be, no nausea, no vomiting. No fever, chills. Patient was well prior to the onset of symptoms. Patient acknowledges prior episode of similar pain, with reassuring evaluation.  Past Medical History  Diagnosis Date  . Hypertension   . Depression   . Asthma   . Arthritis   . Heart murmur   . Diabetes mellitus without complication [facility])    Past Surgical History  Procedure Laterality Date  . Abdominal hysterectomy    . Bladder suspension    . Appendectomy    . Cesarean section      3 previous  . Tubal ligation     Family History  Problem Relation Age of Onset  . Hypertension Mother   . Asthma Mother   . Hypertension Sister   . Asthma Sister   . Hypertension Maternal Grandmother   . Heart defect Sister   . Asthma Sister   . Aneurysm Sister   . Asthma Sister    Social History  Substance Use Topics  . Smoking status: Never Smoker   . Smokeless tobacco: Never Used  . Alcohol Use: No   OB History    Gravida Para Term Preterm AB TAB SAB Ectopic Multiple Living   '3 3 3       3     '$ Review of Systems  Constitutional:       Per HPI, otherwise negative  HENT:       Per HPI, otherwise negative  Respiratory:       Per HPI, otherwise negative  Cardiovascular:       Per HPI, otherwise negative  Gastrointestinal: Negative for vomiting.  Endocrine:   Negative aside from HPI  Genitourinary:       Neg aside from HPI   Musculoskeletal:       Per HPI, otherwise negative  Skin: Negative.   Neurological: Negative for syncope.      Allergies  Codeine  Home Medications   Prior to Admission medications   Medication Sig Start Date End Date Taking? Authorizing Provider  albuterol (PROVENTIL HFA;VENTOLIN HFA) 108 (90 BASE) MCG/ACT inhaler Inhale 2 puffs into the lungs every 6 (six) hours as needed. For shortness of breath. 02/27/15  Yes Yvonne R Lowne Chase, DO  albuterol (PROVENTIL) (2.5 MG/3ML) 0.083% nebulizer solution Take 3 mLs (2.5 mg total) by nebulization every 6 (six) hours as needed for wheezing or shortness of breath. 10/16/14  Yes Alferd Apa Lowne Chase, DO  aspirin EC 81 MG EC tablet Take 1 tablet (81 mg total) by mouth daily. 07/15/14  Yes Eugenie Filler, MD  cloNIDine (CATAPRES) 0.2 MG tablet Take 1 tablet (0.2 mg total) by mouth 2 (two) times daily. 11/19/15  Yes Yvonne R Lowne Chase, DO  glucose blood (BAYER CONTOUR NEXT TEST) test strip Check Blood Sugar once daily. Blood Glucose daily Dx: E11.9 03/18/15  Yes Yvonne R Lowne Chase, DO  glucose blood (ONETOUCH VERIO) test  strip Check blood sugar twice daily. Dx: E11.9 01/22/16  Yes Rosalita Chessman Chase, DO  Lancets MISC Check Blood sugar once a day. 03/18/15  Yes Alferd Apa Lowne Chase, DO  metFORMIN (GLUCOPHAGE XR) 500 MG 24 hr tablet Take 2 tablets (1,000 mg total) by mouth daily with breakfast. 10/14/15  Yes Yvonne R Lowne Chase, DO  Potassium Chloride ER 20 MEQ TBCR Take 40 mEq by mouth daily. 10/14/15  Yes Yvonne R Lowne Chase, DO  triamterene-hydrochlorothiazide (MAXZIDE) 75-50 MG tablet take 1 tablet by mouth once daily 02/16/16  Yes Yvonne R Lowne Chase, DO  benzonatate (TESSALON) 100 MG capsule Take 1 capsule (100 mg total) by mouth every 8 (eight) hours. Patient not taking: Reported on 02/18/2016 12/26/15   Noemi Chapel, MD  cephALEXin (KEFLEX) 500 MG capsule Take 1 capsule (500 mg  total) by mouth 4 (four) times daily. Patient not taking: Reported on 02/18/2016 12/26/15   Noemi Chapel, MD  cyclobenzaprine (FLEXERIL) 5 MG tablet Take 1 tablet (5 mg total) by mouth 3 (three) times daily as needed for muscle spasms. Patient not taking: Reported on 12/26/2015 09/18/14   Rosalita Chessman Chase, DO  diclofenac (VOLTAREN) 75 MG EC tablet Take 1 tablet (75 mg total) by mouth 2 (two) times daily. Patient not taking: Reported on 12/26/2015 09/18/14   [name], DO  Respiratory Therapy Supplies (NEBULIZER COMPRESSOR) KIT 1 Device by Does not apply route as needed. Patient not taking: Reported on 02/18/2016 10/20/14   Rosalita Chessman Chase, DO  traMADol Veatrice Bourbon) 50 MG tablet take 1 tablet by mouth every 6 hours if needed for pain Patient not taking: Reported on 02/18/2016 10/08/15   Rosalita Chessman Chase, DO  traZODone (DESYREL) 50 MG tablet Take 0.5-1 tablets (25-50 mg total) by mouth at bedtime as needed for sleep. Patient not taking: Reported on 12/26/2015 10/03/14   Alferd Apa Lowne Chase, DO   BP 119/108 mmHg  Pulse 88  Temp(Src) 98.1 F (36.7 C) (Oral)  Resp 22  SpO2 99% Physical Exam  Constitutional: She is oriented to person, place, and time. She appears well-developed and well-nourished. No distress.  HENT:  Head: Normocephalic and atraumatic.  Eyes: Conjunctivae and EOM are normal.  Cardiovascular: Normal rate and regular rhythm.   Pulmonary/Chest: Effort normal and breath sounds normal. No stridor. No respiratory distress.  Abdominal: She exhibits no distension.  Musculoskeletal: She exhibits no edema.  Neurological: She is alert and oriented to person, place, and time. No cranial nerve deficit.  Skin: Skin is warm and dry.  Psychiatric: She has a normal mood and affect.  Nursing note and vitals reviewed.   ED Course  Procedures (including critical care time) Labs Review Labs Reviewed  BASIC METABOLIC PANEL - Abnormal; Notable for the following:    Potassium 3.1  (*)    Chloride 98 (*)    Glucose, Bld 188 (*)    Creatinine, Ser 1.17 (*)    GFR calc non Af Amer 52 (*)    All other components within normal limits  CBC  I-STAT TROPOININ, ED    Imaging Review Dg Chest 2 View  02/18/2016  CLINICAL DATA:  Chest pain EXAM: CHEST  2 VIEW COMPARISON:  12/23/2014 FINDINGS: Cardiomediastinal silhouette is stable. No acute infiltrate or pleural effusion. No pulmonary edema. Stable degenerative changes thoracic spine. Stable left basilar scarring. IMPRESSION: No active cardiopulmonary disease. Electronically Signed   By: Lahoma Crocker M.D.   On: 02/18/2016 19:43  I have personally reviewed and evaluated these images and lab results as part of my medical decision-making.   EKG Interpretation   Date/Time:  Thursday Feb 18 2016 19:12:46 EDT Ventricular Rate:  84 PR Interval:  180 QRS Duration: 81 QT Interval:  436 QTC Calculation: 515 R Axis:   -42 Text Interpretation:  Sinus rhythm Abnormal R-wave progression, early  transition Left ventricular hypertrophy Inferior infarct, old Minimal ST  elevation, anterolateral leads Prolonged QT interval No significant change  since last tracing Abnormal ekg Confirmed by Carmin Muskrat  MD ([id number])  on 02/18/2016 7:19:08 PM     Pulse oximetry 100% room air normal Cardiac 95 sinus normal Chart review notable for multiple medical issues, and prior similar pain, right-sided chest pain, right breast pain, no evidence for PE.  9:02 PM Patient is smiling, in no distress, no ongoing complaints. I discussed all findings with the patient and her companion. She will follow-up with primary care tomorrow. MDM  Patient presents with concern of right-sided chest pain. Here the patient is awake, alert, focally on the right side, no left-sided chest pain. He has been present for several hours prior to arrival, and given the reassuring findings, there is low suspicion for ACS, hypertensive crisis, PE. Patient's pain resolved, and  she was smiling on repeat evaluation. Patient will follow up tomorrow with primary care.   Carmin Muskrat, MD 02/18/16 2105

## 2016-03-08 ENCOUNTER — Telehealth: Payer: Self-pay | Admitting: Family Medicine

## 2016-03-08 MED ORDER — LANCETS MISC: Status: DC

## 2016-03-08 NOTE — Telephone Encounter (Signed)
Medication filled to pharmacy as requested.   

## 2016-03-08 NOTE — Telephone Encounter (Signed)
Relation to [id number] Call back number: [phone] Pharmacy: [place], [place] [phone] (Phone) [phone] (Fax)         Reason for call:  Patient requesting one touch verio flex needles

## 2016-04-04 ENCOUNTER — Telehealth: Payer: Self-pay | Admitting: Family Medicine

## 2016-04-04 ENCOUNTER — Encounter: Payer: Medicare Other | Admitting: Family Medicine

## 2016-04-04 NOTE — Telephone Encounter (Signed)
No charge. 

## 2016-04-04 NOTE — Telephone Encounter (Addendum)
Patient LVM at 9:05am cancelling her 3pm appointment due to a family emergency. Charge or no charge

## 2016-04-05 ENCOUNTER — Encounter: Payer: Self-pay | Admitting: Family Medicine

## 2016-05-09 ENCOUNTER — Ambulatory Visit (INDEPENDENT_AMBULATORY_CARE_PROVIDER_SITE_OTHER): Payer: Medicare Other | Admitting: Family Medicine

## 2016-05-09 ENCOUNTER — Ambulatory Visit (HOSPITAL_BASED_OUTPATIENT_CLINIC_OR_DEPARTMENT_OTHER)
Admission: RE | Admit: 2016-05-09 | Discharge: 2016-05-09 | Disposition: A | Discharge status: Home / Self Care | Payer: Medicare Other | Source: Ambulatory Visit | Attending: Family Medicine | Admitting: Family Medicine

## 2016-05-09 ENCOUNTER — Encounter: Payer: Self-pay | Admitting: Family Medicine

## 2016-05-09 VITALS — BP 142/90 | HR 67 | Temp 98.6°F | Ht 63.0 in | Wt 207.6 lb

## 2016-05-09 DIAGNOSIS — G4700 Insomnia, unspecified: Secondary | ICD-10-CM | POA: Diagnosis not present

## 2016-05-09 DIAGNOSIS — E785 Hyperlipidemia, unspecified: Secondary | ICD-10-CM

## 2016-05-09 DIAGNOSIS — E1151 Type 2 diabetes mellitus with diabetic peripheral angiopathy without gangrene: Secondary | ICD-10-CM | POA: Diagnosis not present

## 2016-05-09 DIAGNOSIS — I10 Essential (primary) hypertension: Secondary | ICD-10-CM | POA: Diagnosis not present

## 2016-05-09 DIAGNOSIS — M25571 Pain in right ankle and joints of right foot: Secondary | ICD-10-CM | POA: Diagnosis not present

## 2016-05-09 DIAGNOSIS — E1165 Type 2 diabetes mellitus with hyperglycemia: Secondary | ICD-10-CM

## 2016-05-09 DIAGNOSIS — IMO0001 Reserved for inherently not codable concepts without codable children: Secondary | ICD-10-CM

## 2016-05-09 LAB — COMPREHENSIVE METABOLIC PANEL
ALT: 16 U/L (ref 0–35)
AST: 19 U/L (ref 0–37)
Albumin: 4 g/dL (ref 3.5–5.2)
Alkaline Phosphatase: 82 U/L (ref 39–117)
BUN: 10 mg/dL (ref 6–23)
CO2: 33 mEq/L — ABNORMAL HIGH (ref 19–32)
Calcium: 9.4 mg/dL (ref 8.4–10.5)
Chloride: 99 mEq/L (ref 96–112)
Creatinine, Ser: 0.9 mg/dL (ref 0.40–1.20)
GFR: 84.06 mL/min (ref >60.00)
Glucose, Bld: 232 mg/dL — ABNORMAL HIGH (ref 70–99)
Potassium: 3.9 mEq/L (ref 3.5–5.1)
Sodium: 138 mEq/L (ref 135–145)
Total Bilirubin: 0.4 mg/dL (ref 0.2–1.2)
Total Protein: 7.6 g/dL (ref 6.0–8.3)

## 2016-05-09 LAB — POCT URINALYSIS DIPSTICK
Bilirubin, UA: NEGATIVE
Blood, UA: NEGATIVE
Glucose, UA: NEGATIVE
Ketones, UA: NEGATIVE
Leukocytes, UA: NEGATIVE
Nitrite, UA: NEGATIVE
Protein, UA: NEGATIVE
Spec Grav, UA: 1.015
Urobilinogen, UA: 0.2
pH, UA: 7.5

## 2016-05-09 LAB — MICROALBUMIN / CREATININE URINE RATIO
Creatinine,U: 83.3 mg/dL
Microalb Creat Ratio: 1.1 mg/g (ref 0.0–30.0)
Microalb, Ur: 0.9 mg/dL (ref 0.0–1.9)

## 2016-05-09 LAB — LIPID PANEL
Cholesterol: 177 mg/dL (ref 0–200)
HDL: 48.5 mg/dL (ref >39.00)
LDL Cholesterol: 94 mg/dL (ref 0–99)
NonHDL: 128.41
Total CHOL/HDL Ratio: 4
Triglycerides: 170 mg/dL — ABNORMAL HIGH (ref 0.0–149.0)
VLDL: 34 mg/dL (ref 0.0–40.0)

## 2016-05-09 LAB — URIC ACID: Uric Acid, Serum: 6.3 mg/dL (ref 2.4–7.0)

## 2016-05-09 LAB — HEMOGLOBIN A1C: Hgb A1c MFr Bld: 9.4 % — ABNORMAL HIGH (ref 4.6–6.5)

## 2016-05-09 MED ORDER — CLONIDINE HCL 0.2 MG PO TABS: 0.2000 mg | ORAL_TABLET | Freq: Two times a day (BID) | ORAL | 1 refills | Status: DC

## 2016-05-09 MED ORDER — METFORMIN HCL ER 500 MG PO TB24: 1000.0000 mg | ORAL_TABLET | Freq: Every day | ORAL | 1 refills | Status: DC

## 2016-05-09 MED ORDER — GLUCOSE BLOOD VI STRP: ORAL_STRIP | 12 refills | Status: DC

## 2016-05-09 MED ORDER — TRAZODONE HCL 50 MG PO TABS: 25.0000 mg | ORAL_TABLET | Freq: Every evening | ORAL | 3 refills | Status: DC | PRN

## 2016-05-09 NOTE — Assessment & Plan Note (Signed)
Slightly elevated but pt is in a lot of pain today Recheck 2-3 weeks

## 2016-05-09 NOTE — Progress Notes (Signed)
Patient ID: Heather Walker, female    DOB: July 26, 1961  Age: 55 y.o. MRN: [id number]    Subjective:  Subjective  HPI Heather Walker presents for dm, bp and cholesterol Pt also c/o pain in ankle x 1 1/2 weeks.  No known injury.  Painful to walk on in 10/10 per pt.   HPI HYPERTENSION   Blood pressure range-120/79  Chest pain- no      Dyspnea- no Lightheadedness- no   Edema- no  Other side effects - no   Medication compliance: good Low salt diet- yes    DIABETES    Blood Sugar ranges-125-208  Polyuria- no New Visual problems- no  Hypoglycemic symptoms- no  Other side effects-no Medication compliance - good Last eye exam- 09/2015 Foot exam- today   HYPERLIPIDEMIA  Medication compliance- good RUQ pain- no  Muscle aches- no Other side effects-no    Review of Systems  Constitutional: Negative for appetite change, diaphoresis, fatigue and unexpected weight change.  Eyes: Negative for pain, redness and visual disturbance.  Respiratory: Negative for cough, chest tightness, shortness of breath and wheezing.   Cardiovascular: Negative for chest pain, palpitations and leg swelling.  Endocrine: Negative for cold intolerance, heat intolerance, polydipsia, polyphagia and polyuria.  Genitourinary: Negative for difficulty urinating, dysuria and frequency.  Neurological: Negative for dizziness, light-headedness, numbness and headaches.    History Past Medical History:  Diagnosis Date  . Arthritis   . Asthma   . Depression   . Diabetes mellitus without complication ([place])   . Heart murmur   . Hypertension     She has a past surgical history that includes Abdominal hysterectomy; Bladder suspension; Appendectomy; Cesarean section; and Tubal ligation.   Her family history includes Aneurysm in her sister; Asthma in her mother, sister, sister, and sister; Heart defect in her sister; Hypertension in her maternal grandmother, mother, and sister.She reports that she has never smoked. She  has never used smokeless tobacco. She reports that she does not drink alcohol or use drugs.  Current Outpatient Prescriptions on File Prior to Visit  Medication Sig Dispense Refill  . albuterol (PROVENTIL HFA;VENTOLIN HFA) 108 (90 BASE) MCG/ACT inhaler Inhale 2 puffs into the lungs every 6 (six) hours as needed. For shortness of breath. 1 Inhaler 4  . albuterol (PROVENTIL) (2.5 MG/3ML) 0.083% nebulizer solution Take 3 mLs (2.5 mg total) by nebulization every 6 (six) hours as needed for wheezing or shortness of breath. 150 mL 1  . aspirin EC 81 MG EC tablet Take 1 tablet (81 mg total) by mouth daily.    . cyclobenzaprine (FLEXERIL) 5 MG tablet Take 1 tablet (5 mg total) by mouth 3 (three) times daily as needed for muscle spasms. 30 tablet 1  . Lancets MISC Check Blood sugar once a day. 100 each 5  . Potassium Chloride ER 20 MEQ TBCR Take 40 mEq by mouth daily. 60 tablet 2  . traMADol (ULTRAM) 50 MG tablet take 1 tablet by mouth every 6 hours if needed for pain 90 tablet 0  . triamterene-hydrochlorothiazide (MAXZIDE) 75-50 MG tablet take 1 tablet by mouth once daily 90 tablet 0   No current facility-administered medications on file prior to visit.      Objective:  Objective  Physical Exam  Constitutional: She is oriented to person, place, and time. She appears well-developed and well-nourished.  HENT:  Head: Normocephalic and atraumatic.  Eyes: Conjunctivae and EOM are normal.  Neck: Normal range of motion. Neck supple. No JVD present. Carotid  bruit is not present. No thyromegaly present.  Cardiovascular: Normal rate, regular rhythm and normal heart sounds.   No murmur heard. Pulmonary/Chest: Effort normal and breath sounds normal. No respiratory distress. She has no wheezes. She has no rales. She exhibits no tenderness.  Musculoskeletal: She exhibits no edema.  Neurological: She is alert and oriented to person, place, and time.  Psychiatric: She has a normal mood and affect. Her behavior  is normal.   BP (!) 142/90 (BP Location: Left Arm, Patient Position: Sitting, Cuff Size: Large)   Pulse 67   Temp 98.6 F (37 C) (Oral)   Ht 5\' 3"  (1.6 m)   Wt 207 lb 9.6 oz (94.2 kg)   SpO2 98%   BMI 36.77 kg/m  Wt Readings from Last 3 Encounters:  05/09/16 207 lb 9.6 oz (94.2 kg)  12/25/15 210 lb (95.3 kg)  10/05/15 216 lb 12.8 oz (98.3 kg)     Lab Results  Component Value Date   WBC 5.6 02/18/2016   HGB 12.6 02/18/2016   HCT 37.8 02/18/2016   PLT 262 02/18/2016   GLUCOSE 232 (H) 05/09/2016   CHOL 177 05/09/2016   TRIG 170.0 (H) 05/09/2016   HDL 48.50 05/09/2016   LDLDIRECT 84.0 10/05/2015   LDLCALC 94 05/09/2016   ALT 16 05/09/2016   AST 19 05/09/2016   NA 138 05/09/2016   K 3.9 05/09/2016   CL 99 05/09/2016   CREATININE 0.90 05/09/2016   BUN 10 05/09/2016   CO2 33 (H) 05/09/2016   TSH 1.124 12/08/2014   HGBA1C 9.4 (H) 05/09/2016   MICROALBUR 0.9 05/09/2016    Dg Chest 2 View  Result Date: 02/18/2016 CLINICAL DATA:  Chest pain EXAM: CHEST  2 VIEW COMPARISON:  12/23/2014 FINDINGS: Cardiomediastinal silhouette is stable. No acute infiltrate or pleural effusion. No pulmonary edema. Stable degenerative changes thoracic spine. Stable left basilar scarring. IMPRESSION: No active cardiopulmonary disease. Electronically Signed   By: Heather Walker M.D.   On: 02/18/2016 19:43     Assessment & Plan:  Plan  I have discontinued [name]'s diclofenac, Nebulizer Compressor, cephALEXin, and benzonatate. I am also having her maintain her aspirin, cyclobenzaprine, albuterol, albuterol, traMADol, Potassium Chloride ER, triamterene-hydrochlorothiazide, Lancets, traZODone, glucose blood, cloNIDine, and metFORMIN.  Meds ordered this encounter  Medications  . traZODone (DESYREL) 50 MG tablet    Sig: Take 0.5-1 tablets (25-50 mg total) by mouth at bedtime as needed for sleep.    Dispense:  30 tablet    Refill:  3  . glucose blood (ONETOUCH VERIO) test strip    Sig: Check blood  sugar twice daily. Dx: E11.9    Dispense:  100 each    Refill:  12  . cloNIDine (CATAPRES) 0.2 MG tablet    Sig: Take 1 tablet (0.2 mg total) by mouth 2 (two) times daily.    Dispense:  180 tablet    Refill:  1  . metFORMIN (GLUCOPHAGE XR) 500 MG 24 hr tablet    Sig: Take 2 tablets (1,000 mg total) by mouth daily with breakfast.    Dispense:  180 tablet    Refill:  1    This is a new RX based on labs please discontinue the last RX.    Problem List Items Addressed This Visit      Unprioritized   Diabetes mellitus type II, uncontrolled ([place])   Relevant Medications   metFORMIN (GLUCOPHAGE XR) 500 MG 24 hr tablet   Essential hypertension    Slightly elevated  but pt is in a lot of pain today Recheck 2-3 weeks      Relevant Medications   cloNIDine (CATAPRES) 0.2 MG tablet   Other Relevant Orders   Comprehensive metabolic panel (Completed)   POCT urinalysis dipstick (Completed)   Microalbumin / creatinine urine ratio (Completed)   Right ankle pain    aso splint  Ice, rest , elevation Check xray      Relevant Orders   DG Ankle Complete Right (Completed)   Uric acid (Completed)    Other Visit Diagnoses    DM (diabetes mellitus) type II controlled peripheral vascular disorder ([place])    -  Primary   Relevant Medications   glucose blood (ONETOUCH VERIO) test strip   cloNIDine (CATAPRES) 0.2 MG tablet   metFORMIN (GLUCOPHAGE XR) 500 MG 24 hr tablet   Other Relevant Orders   Comprehensive metabolic panel (Completed)   Hemoglobin A1c (Completed)   POCT urinalysis dipstick (Completed)   Microalbumin / creatinine urine ratio (Completed)   Insomnia       Relevant Medications   traZODone (DESYREL) 50 MG tablet   Hyperlipidemia LDL goal <70       Relevant Medications   cloNIDine (CATAPRES) 0.2 MG tablet   Other Relevant Orders   Comprehensive metabolic panel (Completed)   Lipid panel (Completed)      Follow-up: Return in about 2 weeks (around 05/23/2016) for fasting, annual  exam.  [name], DO

## 2016-05-09 NOTE — Assessment & Plan Note (Signed)
aso splint  Ice, rest , elevation Check xray

## 2016-05-09 NOTE — Patient Instructions (Signed)
diBasic Carbohydrate Counting for Diabetes Mellitus Carbohydrate counting is a method for keeping track of the amount of carbohydrates you eat. Eating carbohydrates naturally increases the level of sugar (glucose) in your blood, so it is important for you to know the amount that is okay for you to have in every meal. Carbohydrate counting helps keep the level of glucose in your blood within normal limits. The amount of carbohydrates allowed is different for every person. A dietitian can help you calculate the amount that is right for you. Once you know the amount of carbohydrates you can have, you can count the carbohydrates in the foods you want to eat. Carbohydrates are found in the following foods:  Grains, such as breads and cereals.  Dried beans and soy products.  Starchy vegetables, such as potatoes, peas, and corn.  Fruit and fruit juices.  Milk and yogurt.  Sweets and snack foods, such as cake, cookies, candy, chips, soft drinks, and fruit drinks. CARBOHYDRATE COUNTING There are two ways to count the carbohydrates in your food. You can use either of the methods or a combination of both. Reading the "Nutrition Facts" on [place] The "Nutrition Facts" is an area that is included on the labels of almost all packaged food and beverages in the [place]. It includes the serving size of that food or beverage and information about the nutrients in each serving of the food, including the grams (g) of carbohydrate per serving.  Decide the number of servings of this food or beverage that you will be able to eat or drink. Multiply that number of servings by the number of grams of carbohydrate that is listed on the label for that serving. The total will be the amount of carbohydrates you will be having when you eat or drink this food or beverage. Learning Standard Serving Sizes of Food When you eat food that is not packaged or does not include "Nutrition Facts" on the label, you need to  measure the servings in order to count the amount of carbohydrates.A serving of most carbohydrate-rich foods contains about 15 g of carbohydrates. The following list includes serving sizes of carbohydrate-rich foods that provide 15 g ofcarbohydrate per serving:   1 slice of bread (1 oz) or 1 six-inch tortilla.    of a hamburger bun or English muffin.  4-6 crackers.   cup unsweetened dry cereal.    cup hot cereal.   cup rice or pasta.    cup mashed potatoes or  of a large baked potato.  1 cup fresh fruit or one small piece of fruit.    cup canned or frozen fruit or fruit juice.  1 cup milk.   cup plain fat-free yogurt or yogurt sweetened with artificial sweeteners.   cup cooked dried beans or starchy vegetable, such as peas, corn, or potatoes.  Decide the number of standard-size servings that you will eat. Multiply that number of servings by 15 (the grams of carbohydrates in that serving). For example, if you eat 2 cups of strawberries, you will have eaten 2 servings and 30 g of carbohydrates (2 servings x 15 g = 30 g). For foods such as soups and casseroles, in which more than one food is mixed in, you will need to count the carbohydrates in each food that is included. EXAMPLE OF CARBOHYDRATE COUNTING Sample Dinner  3 oz chicken breast.   cup of brown rice.   cup of corn.  1 cup milk.   1 cup strawberries with  sugar-free whipped topping.  Carbohydrate Calculation Step 1: Identify the foods that contain carbohydrates:   Rice.   Corn.   Milk.   Strawberries. Step 2:Calculate the number of servings eaten of each:   2 servings of rice.   1 serving of corn.   1 serving of milk.   1 serving of strawberries. Step 3: Multiply each of those number of servings by 15 g:   2 servings of rice x 15 g = 30 g.   1 serving of corn x 15 g = 15 g.   1 serving of milk x 15 g = 15 g.   1 serving of strawberries x 15 g = 15 g. Step 4: Add  together all of the amounts to find the total grams of carbohydrates eaten: 30 g + 15 g + 15 g + 15 g = 75 g.   This information is not intended to replace advice given to you by your health care provider. Make sure you discuss any questions you have with your health care provider.   Document Released: 10/03/2005 Document Revised: 10/24/2014 Document Reviewed: 08/30/2013 Elsevier Interactive Patient Education 2016 Elsevier Inc.  

## 2016-05-09 NOTE — Progress Notes (Signed)
Pre visit review using our clinic review tool, if applicable. No additional management support is needed unless otherwise documented below in the visit note. 

## 2016-05-10 ENCOUNTER — Telehealth: Payer: Self-pay

## 2016-05-10 ENCOUNTER — Other Ambulatory Visit: Payer: Self-pay

## 2016-05-10 ENCOUNTER — Telehealth: Payer: Self-pay | Admitting: Family Medicine

## 2016-05-10 DIAGNOSIS — M7731 Calcaneal spur, right foot: Secondary | ICD-10-CM

## 2016-05-10 MED ORDER — ONETOUCH DELICA LANCETS FINE MISC: 11 refills | Status: DC

## 2016-05-10 NOTE — Telephone Encounter (Signed)
Relation to [id number] Call back [phone] Pharmacy: [place], [place] [phone] (Phone) [phone] (Fax)     Reason for call:  Patient requesting a refill one touch meter

## 2016-05-10 NOTE — Telephone Encounter (Addendum)
[place], RN        Pt bp high today-- she left before I saw her repeat bp  I would like to see her in 2-3 weeks to see if bp comes down with pain relief

## 2016-05-10 NOTE — Telephone Encounter (Signed)
Spoke with patient and she needed the one touch lancets. Rx faxed.     KP

## 2016-05-10 NOTE — Telephone Encounter (Signed)
Follow up appt scheduled as recommended by provider.

## 2016-05-11 ENCOUNTER — Ambulatory Visit (INDEPENDENT_AMBULATORY_CARE_PROVIDER_SITE_OTHER): Payer: Medicare Other | Admitting: Family Medicine

## 2016-05-11 ENCOUNTER — Encounter: Payer: Self-pay | Admitting: Family Medicine

## 2016-05-11 DIAGNOSIS — M25571 Pain in right ankle and joints of right foot: Secondary | ICD-10-CM

## 2016-05-11 MED ORDER — DICLOFENAC SODIUM 75 MG PO TBEC: 75.0000 mg | DELAYED_RELEASE_TABLET | Freq: Two times a day (BID) | ORAL | 1 refills | Status: DC

## 2016-05-11 NOTE — Patient Instructions (Addendum)
You either have sinus tarsi syndrome or an acute flare of gout. Typically you want the uric acid level to be less than 6 but even a normal level of uric acid can be seen in up to 43% of people with an acute flare. They're both treated similarly though. Arch support is very important - our green insoles with scaphoid pad or something like dr. Zoe Lan active series. Avoid flat shoes and barefoot walking as much as possible the next 2 weeks. Icing 15 minutes at a time 3-4 times a day for swelling. Elevate above your heart as needed also for swelling. Diclofenac 75mg  twice a day with food for pain and inflammation for 7-10 days then as needed. Call me in a week to let me know how you're doing. If not improving can consider steroid dose pack, cortisone injection, custom orthotics. Otherwise follow up with me in 2 weeks.

## 2016-05-16 ENCOUNTER — Other Ambulatory Visit: Payer: Self-pay

## 2016-05-16 MED ORDER — SAXAGLIPTIN HCL 5 MG PO TABS: 5.0000 mg | ORAL_TABLET | Freq: Every day | ORAL | 2 refills | Status: DC

## 2016-05-16 NOTE — Assessment & Plan Note (Signed)
independently reviewed radiographs and no evidence DJD, bony findings.  No acute injury - location of pain, description consistent with either acute gout flare (can have normal uric acid up to 43% though her level is slightly about typical normal of 6.0) or sinus tarsi syndrome.  Will treat for both - arch supports critical, icing, avoid flat shoes and barefoot walking.  Diclofenac regularly.  Consider injection, prednisone dose pack, custom orthotics if not improving.  F/u in 2 weeks.

## 2016-05-16 NOTE — Progress Notes (Signed)
PCP and consultation requested by: [name], DO  Subjective:   HPI: Patient is a 55 y.o. female here for right ankle pain.  Patient reports 2 weeks of anterior, anterolateral right ankle pain. No known injury or trauma. Associated with swelling though. Worse walking. Pain level 8/10 and sharp at worst. Tried ASO, hot epsom salt soaks. No skin changes, numbness. No prior issues.  Past Medical History:  Diagnosis Date  . Arthritis   . Asthma   . Depression   . Diabetes mellitus without complication ([place])   . Heart murmur   . Hypertension     Current Outpatient Prescriptions on File Prior to Visit  Medication Sig Dispense Refill  . albuterol (PROVENTIL HFA;VENTOLIN HFA) 108 (90 BASE) MCG/ACT inhaler Inhale 2 puffs into the lungs every 6 (six) hours as needed. For shortness of breath. 1 Inhaler 4  . albuterol (PROVENTIL) (2.5 MG/3ML) 0.083% nebulizer solution Take 3 mLs (2.5 mg total) by nebulization every 6 (six) hours as needed for wheezing or shortness of breath. 150 mL 1  . aspirin EC 81 MG EC tablet Take 1 tablet (81 mg total) by mouth daily.    . cloNIDine (CATAPRES) 0.2 MG tablet Take 1 tablet (0.2 mg total) by mouth 2 (two) times daily. 180 tablet 1  . cyclobenzaprine (FLEXERIL) 5 MG tablet Take 1 tablet (5 mg total) by mouth 3 (three) times daily as needed for muscle spasms. 30 tablet 1  . glucose blood (ONETOUCH VERIO) test strip Check blood sugar twice daily. Dx: E11.9 100 each 12  . Lancets MISC Check Blood sugar once a day. 100 each 5  . metFORMIN (GLUCOPHAGE XR) 500 MG 24 hr tablet Take 2 tablets (1,000 mg total) by mouth daily with breakfast. 180 tablet 1  . ONETOUCH DELICA LANCETS FINE MISC Check blood sugar once daily 50 each 11  . Potassium Chloride ER 20 MEQ TBCR Take 40 mEq by mouth daily. 60 tablet 2  . traMADol (ULTRAM) 50 MG tablet take 1 tablet by mouth every 6 hours if needed for pain 90 tablet 0  . traZODone (DESYREL) 50 MG tablet Take 0.5-1  tablets (25-50 mg total) by mouth at bedtime as needed for sleep. 30 tablet 3  . triamterene-hydrochlorothiazide (MAXZIDE) 75-50 MG tablet take 1 tablet by mouth once daily 90 tablet 0   No current facility-administered medications on file prior to visit.     Past Surgical History:  Procedure Laterality Date  . ABDOMINAL HYSTERECTOMY    . APPENDECTOMY    . BLADDER SUSPENSION    . CESAREAN SECTION     3 previous  . TUBAL LIGATION      Allergies  Allergen Reactions  . Codeine Itching    Social History   Social History  . Marital status: Married    Spouse name: N/A  . Number of children: N/A  . Years of education: ged   Occupational History  . disabled    Social History Main Topics  . Smoking status: Never Smoker  . Smokeless tobacco: Never Used  . Alcohol use No  . Drug use: No  . Sexual activity: Yes    Partners: Male    Birth control/ protection: Surgical   Other Topics Concern  . Not on file   Social History Narrative  . No narrative on file    Family History  Problem Relation Age of Onset  . Hypertension Mother   . Asthma Mother   . Hypertension Sister   .  Asthma Sister   . Hypertension Maternal Grandmother   . Heart defect Sister   . Asthma Sister   . Aneurysm Sister   . Asthma Sister     BP 135/86   Pulse 71   Ht 5\' 3"  (1.6 m)   Wt 205 lb (93 kg)   BMI 36.31 kg/m   Review of Systems: See HPI above.    Objective:  Physical Exam:  Gen: NAD, comfortable in exam room  Right ankle/foot: Mild anterolateral swelling ankle compared to left.  No other gross deformity, ecchymoses, deformity. FROM TTP over sinus tarsi, anterior ankle joint. Negative ant drawer and talar tilt.   Negative syndesmotic compression. Thompsons test negative. NV intact distally.    Left foot/ankle: FROM without pain.  Assessment & Plan:  1. Right ankle pain - independently reviewed radiographs and no evidence DJD, bony findings.  No acute injury - location of  pain, description consistent with either acute gout flare (can have normal uric acid up to 43% though her level is slightly about typical normal of 6.0) or sinus tarsi syndrome.  Will treat for both - arch supports critical, icing, avoid flat shoes and barefoot walking.  Diclofenac regularly.  Consider injection, prednisone dose pack, custom orthotics if not improving.  F/u in 2 weeks.

## 2016-05-24 ENCOUNTER — Ambulatory Visit (INDEPENDENT_AMBULATORY_CARE_PROVIDER_SITE_OTHER): Payer: Medicare Other | Admitting: Family Medicine

## 2016-05-24 ENCOUNTER — Encounter: Payer: Self-pay | Admitting: Family Medicine

## 2016-05-24 VITALS — BP 162/100 | HR 63 | Temp 98.2°F | Resp 17 | Ht 63.0 in | Wt 212.2 lb

## 2016-05-24 DIAGNOSIS — I10 Essential (primary) hypertension: Secondary | ICD-10-CM

## 2016-05-24 MED ORDER — LOSARTAN POTASSIUM 50 MG PO TABS: 50.0000 mg | ORAL_TABLET | Freq: Every day | ORAL | 3 refills | Status: DC

## 2016-05-24 NOTE — Patient Instructions (Signed)
DASH Eating Plan  DASH stands for "Dietary Approaches to Stop Hypertension." The DASH eating plan is a healthy eating plan that has been shown to reduce high blood pressure (hypertension). Additional health benefits may include reducing the risk of type 2 diabetes mellitus, heart disease, and stroke. The DASH eating plan may also help with weight loss.  WHAT DO I NEED TO KNOW ABOUT THE DASH EATING PLAN?  For the DASH eating plan, you will follow these general guidelines:  · Choose foods with a percent daily value for sodium of less than 5% (as listed on the food label).  · Use salt-free seasonings or herbs instead of table salt or sea salt.  · Check with your health care provider or pharmacist before using salt substitutes.  · Eat lower-sodium products, often labeled as "lower sodium" or "no salt added."  · Eat fresh foods.  · Eat more vegetables, fruits, and low-fat dairy products.  · Choose whole grains. Look for the word "whole" as the first word in the ingredient list.  · Choose fish and skinless chicken or turkey more often than red meat. Limit fish, poultry, and meat to 6 oz (170 g) each day.  · Limit sweets, desserts, sugars, and sugary drinks.  · Choose heart-healthy fats.  · Limit cheese to 1 oz (28 g) per day.  · Eat more home-cooked food and less restaurant, buffet, and fast food.  · Limit fried foods.  · Cook foods using methods other than frying.  · Limit canned vegetables. If you do use them, rinse them well to decrease the sodium.  · When eating at a restaurant, ask that your food be prepared with less salt, or no salt if possible.  WHAT FOODS CAN I EAT?  Seek help from a dietitian for individual calorie needs.  Grains  Whole grain or whole wheat bread. Brown rice. Whole grain or whole wheat pasta. Quinoa, bulgur, and whole grain cereals. Low-sodium cereals. Corn or whole wheat flour tortillas. Whole grain cornbread. Whole grain crackers. Low-sodium crackers.  Vegetables  Fresh or frozen vegetables  (raw, steamed, roasted, or grilled). Low-sodium or reduced-sodium tomato and vegetable juices. Low-sodium or reduced-sodium tomato sauce and paste. Low-sodium or reduced-sodium canned vegetables.   Fruits  All fresh, canned (in natural juice), or frozen fruits.  Meat and Other Protein Products  Ground beef (85% or leaner), grass-fed beef, or beef trimmed of fat. Skinless chicken or turkey. Ground chicken or turkey. Pork trimmed of fat. All fish and seafood. Eggs. Dried beans, peas, or lentils. Unsalted nuts and seeds. Unsalted canned beans.  Dairy  Low-fat dairy products, such as skim or 1% milk, 2% or reduced-fat cheeses, low-fat ricotta or cottage cheese, or plain low-fat yogurt. Low-sodium or reduced-sodium cheeses.  Fats and Oils  Tub margarines without trans fats. Light or reduced-fat mayonnaise and salad dressings (reduced sodium). Avocado. Safflower, olive, or canola oils. Natural peanut or almond butter.  Other  Unsalted popcorn and pretzels.  The items listed above may not be a complete list of recommended foods or beverages. Contact your dietitian for more options.  WHAT FOODS ARE NOT RECOMMENDED?  Grains  White bread. White pasta. White rice. Refined cornbread. Bagels and croissants. Crackers that contain trans fat.  Vegetables  Creamed or fried vegetables. Vegetables in a cheese sauce. Regular canned vegetables. Regular canned tomato sauce and paste. Regular tomato and vegetable juices.  Fruits  Dried fruits. Canned fruit in light or heavy syrup. Fruit juice.  Meat and Other Protein   Products  Fatty cuts of meat. Ribs, chicken wings, bacon, sausage, bologna, salami, chitterlings, fatback, hot dogs, bratwurst, and packaged luncheon meats. Salted nuts and seeds. Canned beans with salt.  Dairy  Whole or 2% milk, cream, half-and-half, and cream cheese. Whole-fat or sweetened yogurt. Full-fat cheeses or blue cheese. Nondairy creamers and whipped toppings. Processed cheese, cheese spreads, or cheese  curds.  Condiments  Onion and garlic salt, seasoned salt, table salt, and sea salt. Canned and packaged gravies. Worcestershire sauce. Tartar sauce. Barbecue sauce. Teriyaki sauce. Soy sauce, including reduced sodium. Steak sauce. Fish sauce. Oyster sauce. Cocktail sauce. Horseradish. Ketchup and mustard. Meat flavorings and tenderizers. Bouillon cubes. Hot sauce. Tabasco sauce. Marinades. Taco seasonings. Relishes.  Fats and Oils  Butter, stick margarine, lard, shortening, ghee, and bacon fat. Coconut, palm kernel, or palm oils. Regular salad dressings.  Other  Pickles and olives. Salted popcorn and pretzels.  The items listed above may not be a complete list of foods and beverages to avoid. Contact your dietitian for more information.  WHERE CAN I FIND MORE INFORMATION?  National Heart, Lung, and Blood Institute: www.nhlbi.nih.gov/health/health-topics/topics/dash/     This information is not intended to replace advice given to you by your health care provider. Make sure you discuss any questions you have with your health care provider.     Document Released: 09/22/2011 Document Revised: 10/24/2014 Document Reviewed: 08/07/2013  Elsevier Interactive Patient Education ©2016 Elsevier Inc.

## 2016-05-24 NOTE — Progress Notes (Signed)
Check labs.  Adjust meds prn Patient ID: Heather Walker, female    DOB: [birth year]  Age: 55 y.o. MRN: [id number]    Subjective:  Subjective  HPI Heather Walker presents for bp f/u.    Review of Systems  Constitutional: Negative for activity change, appetite change, fatigue and unexpected weight change.  Respiratory: Negative for cough and shortness of breath.   Cardiovascular: Negative for chest pain and palpitations.  Psychiatric/Behavioral: Negative for behavioral problems and dysphoric mood. The patient is not nervous/anxious.     History Past Medical History:  Diagnosis Date  . Arthritis   . Asthma   . Depression   . Diabetes mellitus without complication ([place])   . Heart murmur   . Hypertension     She has a past surgical history that includes Abdominal hysterectomy; Bladder suspension; Appendectomy; Cesarean section; and Tubal ligation.   Her family history includes Aneurysm in her sister; Asthma in her mother, sister, sister, and sister; Heart defect in her sister; Hypertension in her maternal grandmother, mother, and sister.She reports that she has never smoked. She has never used smokeless tobacco. She reports that she does not drink alcohol or use drugs.  Current Outpatient Prescriptions on File Prior to Visit  Medication Sig Dispense Refill  . albuterol (PROVENTIL HFA;VENTOLIN HFA) 108 (90 BASE) MCG/ACT inhaler Inhale 2 puffs into the lungs every 6 (six) hours as needed. For shortness of breath. 1 Inhaler 4  . albuterol (PROVENTIL) (2.5 MG/3ML) 0.083% nebulizer solution Take 3 mLs (2.5 mg total) by nebulization every 6 (six) hours as needed for wheezing or shortness of breath. 150 mL 1  . aspirin EC 81 MG EC tablet Take 1 tablet (81 mg total) by mouth daily.    . cloNIDine (CATAPRES) 0.2 MG tablet Take 1 tablet (0.2 mg total) by mouth 2 (two) times daily. 180 tablet 1  . cyclobenzaprine (FLEXERIL) 5 MG tablet Take 1 tablet (5 mg total) by mouth 3 (three) times daily as  needed for muscle spasms. 30 tablet 1  . diclofenac (VOLTAREN) 75 MG EC tablet Take 1 tablet (75 mg total) by mouth 2 (two) times daily. 60 tablet 1  . glucose blood (ONETOUCH VERIO) test strip Check blood sugar twice daily. Dx: E11.9 100 each 12  . Lancets MISC Check Blood sugar once a day. 100 each 5  . metFORMIN (GLUCOPHAGE XR) 500 MG 24 hr tablet Take 2 tablets (1,000 mg total) by mouth daily with breakfast. 180 tablet 1  . ONETOUCH DELICA LANCETS FINE MISC Check blood sugar once daily 50 each 11  . Potassium Chloride ER 20 MEQ TBCR Take 40 mEq by mouth daily. 60 tablet 2  . saxagliptin HCl (ONGLYZA) 5 MG TABS tablet Take 1 tablet (5 mg total) by mouth daily. 30 tablet 2  . traMADol (ULTRAM) 50 MG tablet take 1 tablet by mouth every 6 hours if needed for pain 90 tablet 0  . traZODone (DESYREL) 50 MG tablet Take 0.5-1 tablets (25-50 mg total) by mouth at bedtime as needed for sleep. 30 tablet 3  . triamterene-hydrochlorothiazide (MAXZIDE) 75-50 MG tablet take 1 tablet by mouth once daily 90 tablet 0   No current facility-administered medications on file prior to visit.      Objective:  Objective  Physical Exam  Constitutional: She is oriented to person, place, and time. She appears well-developed and well-nourished.  HENT:  Head: Normocephalic and atraumatic.  Eyes: Conjunctivae and EOM are normal.  Neck: Normal range of motion.  Neck supple. No JVD present. Carotid bruit is not present. No thyromegaly present.  Cardiovascular: Normal rate, regular rhythm and normal heart sounds.   No murmur heard. Pulmonary/Chest: Effort normal and breath sounds normal. No respiratory distress. She has no wheezes. She has no rales. She exhibits no tenderness.  Musculoskeletal: She exhibits no edema.  Neurological: She is alert and oriented to person, place, and time.  Psychiatric: She has a normal mood and affect. Her behavior is normal. Judgment and thought content normal.  Nursing note and vitals  reviewed.  BP (!) 162/100 (BP Location: Right Arm, Patient Position: Sitting, Cuff Size: Large)   Pulse 63   Temp 98.2 F (36.8 C) (Oral)   Resp 17   Ht 5\' 3"  (1.6 m)   Wt 212 lb 3.2 oz (96.3 kg)   SpO2 98%   BMI 37.59 kg/m  Wt Readings from Last 3 Encounters:  05/24/16 212 lb 3.2 oz (96.3 kg)  05/11/16 205 lb (93 kg)  05/09/16 207 lb 9.6 oz (94.2 kg)     Lab Results  Component Value Date   WBC 5.6 02/18/2016   HGB 12.6 02/18/2016   HCT 37.8 02/18/2016   PLT 262 02/18/2016   GLUCOSE 232 (H) 05/09/2016   CHOL 177 05/09/2016   TRIG 170.0 (H) 05/09/2016   HDL 48.50 05/09/2016   LDLDIRECT 84.0 10/05/2015   LDLCALC 94 05/09/2016   ALT 16 05/09/2016   AST 19 05/09/2016   NA 138 05/09/2016   K 3.9 05/09/2016   CL 99 05/09/2016   CREATININE 0.90 05/09/2016   BUN 10 05/09/2016   CO2 33 (H) 05/09/2016   TSH 1.124 12/08/2014   HGBA1C 9.4 (H) 05/09/2016   MICROALBUR 0.9 05/09/2016    Dg Ankle Complete Right  Result Date: 05/09/2016 CLINICAL DATA:  Lateral ankle pain for a week.  No known injury. EXAM: RIGHT ANKLE - COMPLETE 3+ VIEW COMPARISON:  None. FINDINGS: No acute bony abnormality. Specifically, no fracture, subluxation, or dislocation. Soft tissues are intact. Soft tissue calcifications likely within the plantar fascia near a calcaneal bone spur. IMPRESSION: No acute bony abnormality. Electronically Signed   By: Rolm Baptise M.D.   On: 05/09/2016 12:51    Assessment & Plan:  Plan  I am having Heather Walker start on losartan. I am also having her maintain her aspirin, cyclobenzaprine, albuterol, albuterol, traMADol, Potassium Chloride ER, triamterene-hydrochlorothiazide, Lancets, traZODone, glucose blood, cloNIDine, metFORMIN, ONETOUCH DELICA LANCETS FINE, diclofenac, and saxagliptin HCl.  Meds ordered this encounter  Medications  . losartan (COZAAR) 50 MG tablet    Sig: Take 1 tablet (50 mg total) by mouth daily.    Dispense:  90 tablet    Refill:  3    Problem  List Items Addressed This Visit      Unprioritized   Essential hypertension - Primary    con't meds Add losartan rto 2-3 weeks      Relevant Medications   losartan (COZAAR) 50 MG tablet    Other Visit Diagnoses   None.     Follow-up: Return in about 2 weeks (around 06/07/2016) for hypertension.  [name], DO

## 2016-05-25 NOTE — Assessment & Plan Note (Signed)
con't meds Add losartan rto 2-3 weeks

## 2016-06-07 ENCOUNTER — Encounter: Payer: Self-pay | Admitting: Family Medicine

## 2016-06-07 ENCOUNTER — Ambulatory Visit (INDEPENDENT_AMBULATORY_CARE_PROVIDER_SITE_OTHER): Payer: Medicare Other | Admitting: Family Medicine

## 2016-06-07 VITALS — BP 122/90 | HR 75 | Temp 97.8°F | Wt 206.8 lb

## 2016-06-07 DIAGNOSIS — H109 Unspecified conjunctivitis: Secondary | ICD-10-CM

## 2016-06-07 DIAGNOSIS — F329 Major depressive disorder, single episode, unspecified: Secondary | ICD-10-CM | POA: Diagnosis not present

## 2016-06-07 DIAGNOSIS — I10 Essential (primary) hypertension: Secondary | ICD-10-CM

## 2016-06-07 DIAGNOSIS — F32A Depression, unspecified: Secondary | ICD-10-CM

## 2016-06-07 MED ORDER — NEOMYCIN-POLYMYXIN-HC 3.5-10000-1 OP SUSP: 3.0000 [drp] | Freq: Four times a day (QID) | OPHTHALMIC | 0 refills | Status: DC

## 2016-06-07 MED ORDER — CLONIDINE HCL 0.2 MG PO TABS: 0.2000 mg | ORAL_TABLET | Freq: Two times a day (BID) | ORAL | 1 refills | Status: DC

## 2016-06-07 MED ORDER — LOSARTAN POTASSIUM 100 MG PO TABS: 100.0000 mg | ORAL_TABLET | Freq: Every day | ORAL | 3 refills | Status: DC

## 2016-06-07 NOTE — Progress Notes (Signed)
Patient ID: [name], female    DOB: November 06, 1960  Age: 55 y.o. MRN: [id number]    Subjective:  Subjective  HPI Heather Walker presents for f/u bp --- no chest pain or sob. Pt also c/o pink eye x last few days.  No congestion.  No fever, chills.  Review of Systems  Constitutional: Negative for appetite change, diaphoresis, fatigue and unexpected weight change.  Eyes: Positive for discharge and redness. Negative for photophobia and visual disturbance.  Respiratory: Negative for cough, chest tightness, shortness of breath and wheezing.   Cardiovascular: Negative for chest pain, palpitations and leg swelling.  Endocrine: Negative for cold intolerance, heat intolerance, polydipsia, polyphagia and polyuria.  Genitourinary: Negative for difficulty urinating, dysuria and frequency.  Neurological: Negative for dizziness, light-headedness, numbness and headaches.    History Past Medical History:  Diagnosis Date  . Arthritis   . Asthma   . Depression   . Diabetes mellitus without complication ([place])   . Heart murmur   . Hypertension     She has a past surgical history that includes Abdominal hysterectomy; Bladder suspension; Appendectomy; Cesarean section; and Tubal ligation.   Her family history includes Aneurysm in her sister; Asthma in her mother, sister, sister, and sister; Heart defect in her sister; Hypertension in her maternal grandmother, mother, and sister.She reports that she has never smoked. She has never used smokeless tobacco. She reports that she does not drink alcohol or use drugs.  Current Outpatient Prescriptions on File Prior to Visit  Medication Sig Dispense Refill  . albuterol (PROVENTIL HFA;VENTOLIN HFA) 108 (90 BASE) MCG/ACT inhaler Inhale 2 puffs into the lungs every 6 (six) hours as needed. For shortness of breath. 1 Inhaler 4  . albuterol (PROVENTIL) (2.5 MG/3ML) 0.083% nebulizer solution Take 3 mLs (2.5 mg total) by nebulization every 6 (six) hours as needed  for wheezing or shortness of breath. 150 mL 1  . aspirin EC 81 MG EC tablet Take 1 tablet (81 mg total) by mouth daily.    . cyclobenzaprine (FLEXERIL) 5 MG tablet Take 1 tablet (5 mg total) by mouth 3 (three) times daily as needed for muscle spasms. 30 tablet 1  . diclofenac (VOLTAREN) 75 MG EC tablet Take 1 tablet (75 mg total) by mouth 2 (two) times daily. 60 tablet 1  . glucose blood (ONETOUCH VERIO) test strip Check blood sugar twice daily. Dx: E11.9 100 each 12  . Lancets MISC Check Blood sugar once a day. 100 each 5  . metFORMIN (GLUCOPHAGE XR) 500 MG 24 hr tablet Take 2 tablets (1,000 mg total) by mouth daily with breakfast. 180 tablet 1  . ONETOUCH DELICA LANCETS FINE MISC Check blood sugar once daily 50 each 11  . Potassium Chloride ER 20 MEQ TBCR Take 40 mEq by mouth daily. 60 tablet 2  . saxagliptin HCl (ONGLYZA) 5 MG TABS tablet Take 1 tablet (5 mg total) by mouth daily. 30 tablet 2  . traMADol (ULTRAM) 50 MG tablet take 1 tablet by mouth every 6 hours if needed for pain 90 tablet 0  . traZODone (DESYREL) 50 MG tablet Take 0.5-1 tablets (25-50 mg total) by mouth at bedtime as needed for sleep. 30 tablet 3  . triamterene-hydrochlorothiazide (MAXZIDE) 75-50 MG tablet take 1 tablet by mouth once daily 90 tablet 0   No current facility-administered medications on file prior to visit.      Objective:  Objective  Physical Exam  Constitutional: She is oriented to person, place, and time. She  appears well-developed and well-nourished.  HENT:  Head: Normocephalic and atraumatic.  Eyes: Conjunctivae and EOM are normal. Right eye exhibits discharge. Left eye exhibits discharge.  Neck: Normal range of motion. Neck supple. No JVD present. Carotid bruit is not present. No thyromegaly present.  Cardiovascular: Normal rate, regular rhythm and normal heart sounds.   No murmur heard. Pulmonary/Chest: Effort normal and breath sounds normal. No respiratory distress. She has no wheezes. She has no  rales. She exhibits no tenderness.  Musculoskeletal: She exhibits no edema.  Neurological: She is alert and oriented to person, place, and time.  Psychiatric: She has a normal mood and affect.  Nursing note and vitals reviewed.  BP 122/90 (BP Location: Left Arm, Patient Position: Sitting, Cuff Size: Normal)   Pulse 75   Temp 97.8 F (36.6 C) (Oral)   Wt 206 lb 12.8 oz (93.8 kg)   SpO2 97%   BMI 36.63 kg/m  Wt Readings from Last 3 Encounters:  06/07/16 206 lb 12.8 oz (93.8 kg)  05/24/16 212 lb 3.2 oz (96.3 kg)  05/11/16 205 lb (93 kg)     Lab Results  Component Value Date   WBC 5.6 02/18/2016   HGB 12.6 02/18/2016   HCT 37.8 02/18/2016   PLT 262 02/18/2016   GLUCOSE 232 (H) 05/09/2016   CHOL 177 05/09/2016   TRIG 170.0 (H) 05/09/2016   HDL 48.50 05/09/2016   LDLDIRECT 84.0 10/05/2015   LDLCALC 94 05/09/2016   ALT 16 05/09/2016   AST 19 05/09/2016   NA 138 05/09/2016   K 3.9 05/09/2016   CL 99 05/09/2016   CREATININE 0.90 05/09/2016   BUN 10 05/09/2016   CO2 33 (H) 05/09/2016   TSH 1.124 12/08/2014   HGBA1C 9.4 (H) 05/09/2016   MICROALBUR 0.9 05/09/2016    Dg Ankle Complete Right  Result Date: 05/09/2016 CLINICAL DATA:  Lateral ankle pain for a week.  No known injury. EXAM: RIGHT ANKLE - COMPLETE 3+ VIEW COMPARISON:  None. FINDINGS: No acute bony abnormality. Specifically, no fracture, subluxation, or dislocation. Soft tissues are intact. Soft tissue calcifications likely within the plantar fascia near a calcaneal bone spur. IMPRESSION: No acute bony abnormality. Electronically Signed   By: Rolm Baptise M.D.   On: 05/09/2016 12:51    Assessment & Plan:  Plan  I have discontinued Heather Walker's losartan. I am also having her start on neomycin-polymyxin-hydrocortisone and losartan. Additionally, I am having her maintain her aspirin, cyclobenzaprine, albuterol, albuterol, traMADol, Potassium Chloride ER, triamterene-hydrochlorothiazide, Lancets, traZODone, glucose blood,  metFORMIN, ONETOUCH DELICA LANCETS FINE, diclofenac, saxagliptin HCl, and cloNIDine.  Meds ordered this encounter  Medications  . neomycin-polymyxin-hydrocortisone (CORTISPORIN) 3.5-10000-1 ophthalmic suspension    Sig: Place 3 drops into both eyes 4 (four) times daily.    Dispense:  7.5 mL    Refill:  0  . losartan (COZAAR) 100 MG tablet    Sig: Take 1 tablet (100 mg total) by mouth daily.    Dispense:  90 tablet    Refill:  3  . cloNIDine (CATAPRES) 0.2 MG tablet    Sig: Take 1 tablet (0.2 mg total) by mouth 2 (two) times daily.    Dispense:  180 tablet    Refill:  1    Problem List Items Addressed This Visit      Unprioritized   Essential hypertension    Stable con't cozaar and clonidine       Relevant Medications   losartan (COZAAR) 100 MG tablet   cloNIDine (CATAPRES)  0.2 MG tablet    Other Visit Diagnoses    Bilateral conjunctivitis    -  Primary   Relevant Medications   neomycin-polymyxin-hydrocortisone (CORTISPORIN) 3.5-10000-1 ophthalmic suspension   Depression       Relevant Medications   losartan (COZAAR) 100 MG tablet      Follow-up: Return in about 6 months (around 12/08/2016) for annual exam, fasting.  [name], DO

## 2016-06-07 NOTE — Patient Instructions (Signed)

## 2016-06-07 NOTE — Assessment & Plan Note (Addendum)
Slightly elevated con't clonidine and inc cozaar to 100 mg daily

## 2016-06-07 NOTE — Progress Notes (Signed)
Patient ID: Heather Walker, female    DOB: Mar 07, 1961  Age: 55 y.o. MRN: [id number]    Subjective:  Subjective  HPI Heather Walker presents for bp f/u . No cp, sob or palpitations.    Pt also c/o pink eye b/l x 2-3 days.  Her eyes have been crusted shut the last few days.     Review of Systems  Constitutional: Negative for activity change, appetite change, fatigue and unexpected weight change.  Eyes: Positive for discharge and redness. Negative for photophobia, pain and visual disturbance.  Respiratory: Negative for cough and shortness of breath.   Cardiovascular: Negative for chest pain and palpitations.  Psychiatric/Behavioral: Negative for behavioral problems and dysphoric mood. The patient is not nervous/anxious.     History Past Medical History:  Diagnosis Date  . Arthritis   . Asthma   . Depression   . Diabetes mellitus without complication ([place])   . Heart murmur   . Hypertension     She has a past surgical history that includes Abdominal hysterectomy; Bladder suspension; Appendectomy; Cesarean section; and Tubal ligation.   Her family history includes Aneurysm in her sister; Asthma in her mother, sister, sister, and sister; Heart defect in her sister; Hypertension in her maternal grandmother, mother, and sister.She reports that she has never smoked. She has never used smokeless tobacco. She reports that she does not drink alcohol or use drugs.  Current Outpatient Prescriptions on File Prior to Visit  Medication Sig Dispense Refill  . albuterol (PROVENTIL HFA;VENTOLIN HFA) 108 (90 BASE) MCG/ACT inhaler Inhale 2 puffs into the lungs every 6 (six) hours as needed. For shortness of breath. 1 Inhaler 4  . albuterol (PROVENTIL) (2.5 MG/3ML) 0.083% nebulizer solution Take 3 mLs (2.5 mg total) by nebulization every 6 (six) hours as needed for wheezing or shortness of breath. 150 mL 1  . aspirin EC 81 MG EC tablet Take 1 tablet (81 mg total) by mouth daily.    . cyclobenzaprine  (FLEXERIL) 5 MG tablet Take 1 tablet (5 mg total) by mouth 3 (three) times daily as needed for muscle spasms. 30 tablet 1  . diclofenac (VOLTAREN) 75 MG EC tablet Take 1 tablet (75 mg total) by mouth 2 (two) times daily. 60 tablet 1  . glucose blood (ONETOUCH VERIO) test strip Check blood sugar twice daily. Dx: E11.9 100 each 12  . Lancets MISC Check Blood sugar once a day. 100 each 5  . metFORMIN (GLUCOPHAGE XR) 500 MG 24 hr tablet Take 2 tablets (1,000 mg total) by mouth daily with breakfast. 180 tablet 1  . ONETOUCH DELICA LANCETS FINE MISC Check blood sugar once daily 50 each 11  . Potassium Chloride ER 20 MEQ TBCR Take 40 mEq by mouth daily. 60 tablet 2  . saxagliptin HCl (ONGLYZA) 5 MG TABS tablet Take 1 tablet (5 mg total) by mouth daily. 30 tablet 2  . traMADol (ULTRAM) 50 MG tablet take 1 tablet by mouth every 6 hours if needed for pain 90 tablet 0  . traZODone (DESYREL) 50 MG tablet Take 0.5-1 tablets (25-50 mg total) by mouth at bedtime as needed for sleep. 30 tablet 3  . triamterene-hydrochlorothiazide (MAXZIDE) 75-50 MG tablet take 1 tablet by mouth once daily 90 tablet 0   No current facility-administered medications on file prior to visit.      Objective:  Objective  Physical Exam  Constitutional: She is oriented to person, place, and time. She appears well-developed and well-nourished.  HENT:  Head:  Normocephalic and atraumatic.  Eyes: Conjunctivae and EOM are normal.  Neck: Normal range of motion. Neck supple. No JVD present. Carotid bruit is not present. No thyromegaly present.  Cardiovascular: Normal rate, regular rhythm and normal heart sounds.   No murmur heard. Pulmonary/Chest: Effort normal and breath sounds normal. No respiratory distress. She has no wheezes. She has no rales. She exhibits no tenderness.  Musculoskeletal: She exhibits no edema.  Neurological: She is alert and oriented to person, place, and time.  Psychiatric: She has a normal mood and affect. Her  behavior is normal. Judgment and thought content normal.  Nursing note and vitals reviewed.  BP 122/90 (BP Location: Left Arm, Patient Position: Sitting, Cuff Size: Normal)   Pulse 75   Temp 97.8 F (36.6 C) (Oral)   Wt 206 lb 12.8 oz (93.8 kg)   SpO2 97%   BMI 36.63 kg/m  Wt Readings from Last 3 Encounters:  06/07/16 206 lb 12.8 oz (93.8 kg)  05/24/16 212 lb 3.2 oz (96.3 kg)  05/11/16 205 lb (93 kg)     Lab Results  Component Value Date   WBC 5.6 02/18/2016   HGB 12.6 02/18/2016   HCT 37.8 02/18/2016   PLT 262 02/18/2016   GLUCOSE 232 (H) 05/09/2016   CHOL 177 05/09/2016   TRIG 170.0 (H) 05/09/2016   HDL 48.50 05/09/2016   LDLDIRECT 84.0 10/05/2015   LDLCALC 94 05/09/2016   ALT 16 05/09/2016   AST 19 05/09/2016   NA 138 05/09/2016   K 3.9 05/09/2016   CL 99 05/09/2016   CREATININE 0.90 05/09/2016   BUN 10 05/09/2016   CO2 33 (H) 05/09/2016   TSH 1.124 12/08/2014   HGBA1C 9.4 (H) 05/09/2016   MICROALBUR 0.9 05/09/2016    Dg Ankle Complete Right  Result Date: 05/09/2016 CLINICAL DATA:  Lateral ankle pain for a week.  No known injury. EXAM: RIGHT ANKLE - COMPLETE 3+ VIEW COMPARISON:  None. FINDINGS: No acute bony abnormality. Specifically, no fracture, subluxation, or dislocation. Soft tissues are intact. Soft tissue calcifications likely within the plantar fascia near a calcaneal bone spur. IMPRESSION: No acute bony abnormality. Electronically Signed   By: Rolm Baptise M.D.   On: 05/09/2016 12:51    Assessment & Plan:  Plan  I have discontinued Ms. Fulp's losartan. I am also having her start on neomycin-polymyxin-hydrocortisone and losartan. Additionally, I am having her maintain her aspirin, cyclobenzaprine, albuterol, albuterol, traMADol, Potassium Chloride ER, triamterene-hydrochlorothiazide, Lancets, traZODone, glucose blood, metFORMIN, ONETOUCH DELICA LANCETS FINE, diclofenac, saxagliptin HCl, and cloNIDine.  Meds ordered this encounter  Medications  .  neomycin-polymyxin-hydrocortisone (CORTISPORIN) 3.5-10000-1 ophthalmic suspension    Sig: Place 3 drops into both eyes 4 (four) times daily.    Dispense:  7.5 mL    Refill:  0  . losartan (COZAAR) 100 MG tablet    Sig: Take 1 tablet (100 mg total) by mouth daily.    Dispense:  90 tablet    Refill:  3  . cloNIDine (CATAPRES) 0.2 MG tablet    Sig: Take 1 tablet (0.2 mg total) by mouth 2 (two) times daily.    Dispense:  180 tablet    Refill:  1    Problem List Items Addressed This Visit      Unprioritized   Essential hypertension    Slightly elevated con't clonidine and inc cozaar to 100 mg daily       Relevant Medications   losartan (COZAAR) 100 MG tablet   cloNIDine (CATAPRES) 0.2  MG tablet    Other Visit Diagnoses    Bilateral conjunctivitis    -  Primary   Relevant Medications   neomycin-polymyxin-hydrocortisone (CORTISPORIN) 3.5-10000-1 ophthalmic suspension   Depression       Relevant Medications   losartan (COZAAR) 100 MG tablet      Follow-up: Return in about 6 months (around 12/08/2016) for annual exam, fasting.  [name], DO

## 2016-06-07 NOTE — Progress Notes (Signed)
Pre visit review using our clinic review tool, if applicable. No additional management support is needed unless otherwise documented below in the visit note. 

## 2016-06-17 ENCOUNTER — Other Ambulatory Visit: Payer: Self-pay | Admitting: Family Medicine

## 2016-09-13 ENCOUNTER — Other Ambulatory Visit: Payer: Self-pay | Admitting: Family Medicine

## 2016-10-06 ENCOUNTER — Other Ambulatory Visit: Payer: Self-pay | Admitting: Family Medicine

## 2016-10-06 NOTE — Telephone Encounter (Signed)
approved

## 2016-10-06 NOTE — Telephone Encounter (Signed)
Faxed hard copy of Rx request to [facility]: [phone]. Forwarded to provider for review and signature.

## 2016-11-10 ENCOUNTER — Other Ambulatory Visit: Payer: Medicare Other

## 2016-11-10 ENCOUNTER — Ambulatory Visit (INDEPENDENT_AMBULATORY_CARE_PROVIDER_SITE_OTHER): Payer: Medicare Other | Admitting: Family Medicine

## 2016-11-10 ENCOUNTER — Ambulatory Visit (HOSPITAL_BASED_OUTPATIENT_CLINIC_OR_DEPARTMENT_OTHER)
Admission: RE | Admit: 2016-11-10 | Discharge: 2016-11-10 | Disposition: A | Discharge status: Home / Self Care | Payer: Medicare Other | Source: Ambulatory Visit | Attending: Family Medicine | Admitting: Family Medicine

## 2016-11-10 ENCOUNTER — Encounter: Payer: Self-pay | Admitting: Family Medicine

## 2016-11-10 VITALS — BP 117/85 | HR 80 | Temp 98.4°F | Resp 16 | Ht 63.0 in | Wt 209.2 lb

## 2016-11-10 DIAGNOSIS — E785 Hyperlipidemia, unspecified: Secondary | ICD-10-CM | POA: Diagnosis not present

## 2016-11-10 DIAGNOSIS — Z9189 Other specified personal risk factors, not elsewhere classified: Secondary | ICD-10-CM

## 2016-11-10 DIAGNOSIS — I10 Essential (primary) hypertension: Secondary | ICD-10-CM | POA: Diagnosis not present

## 2016-11-10 DIAGNOSIS — Z0000 Encounter for general adult medical examination without abnormal findings: Secondary | ICD-10-CM | POA: Diagnosis not present

## 2016-11-10 DIAGNOSIS — K219 Gastro-esophageal reflux disease without esophagitis: Secondary | ICD-10-CM

## 2016-11-10 DIAGNOSIS — E118 Type 2 diabetes mellitus with unspecified complications: Secondary | ICD-10-CM

## 2016-11-10 DIAGNOSIS — R7309 Other abnormal glucose: Secondary | ICD-10-CM

## 2016-11-10 DIAGNOSIS — F329 Major depressive disorder, single episode, unspecified: Secondary | ICD-10-CM

## 2016-11-10 DIAGNOSIS — Z1239 Encounter for other screening for malignant neoplasm of breast: Secondary | ICD-10-CM

## 2016-11-10 DIAGNOSIS — F32A Depression, unspecified: Secondary | ICD-10-CM

## 2016-11-10 DIAGNOSIS — G4700 Insomnia, unspecified: Secondary | ICD-10-CM

## 2016-11-10 DIAGNOSIS — E1165 Type 2 diabetes mellitus with hyperglycemia: Secondary | ICD-10-CM

## 2016-11-10 DIAGNOSIS — Z1231 Encounter for screening mammogram for malignant neoplasm of breast: Secondary | ICD-10-CM | POA: Diagnosis not present

## 2016-11-10 DIAGNOSIS — IMO0001 Reserved for inherently not codable concepts without codable children: Secondary | ICD-10-CM

## 2016-11-10 LAB — COMPREHENSIVE METABOLIC PANEL
ALT: 13 U/L (ref 0–35)
AST: 14 U/L (ref 0–37)
Albumin: 4.2 g/dL (ref 3.5–5.2)
Alkaline Phosphatase: 72 U/L (ref 39–117)
BUN: 16 mg/dL (ref 6–23)
CO2: 27 mEq/L (ref 19–32)
Calcium: 9.5 mg/dL (ref 8.4–10.5)
Chloride: 100 mEq/L (ref 96–112)
Creatinine, Ser: 1.13 mg/dL (ref 0.40–1.20)
GFR: 64.52 mL/min (ref >60.00)
Glucose, Bld: 258 mg/dL — ABNORMAL HIGH (ref 70–99)
Potassium: 3.3 mEq/L — ABNORMAL LOW (ref 3.5–5.1)
Sodium: 137 mEq/L (ref 135–145)
Total Bilirubin: 0.3 mg/dL (ref 0.2–1.2)
Total Protein: 8 g/dL (ref 6.0–8.3)

## 2016-11-10 LAB — MICROALBUMIN / CREATININE URINE RATIO
Creatinine,U: 144.7 mg/dL
Microalb Creat Ratio: 0.9 mg/g (ref 0.0–30.0)
Microalb, Ur: 1.3 mg/dL (ref 0.0–1.9)

## 2016-11-10 LAB — LIPID PANEL
Cholesterol: 193 mg/dL (ref 0–200)
HDL: 57.3 mg/dL (ref >39.00)
NonHDL: 135.61
Total CHOL/HDL Ratio: 3
Triglycerides: 207 mg/dL — ABNORMAL HIGH (ref 0.0–149.0)
VLDL: 41.4 mg/dL — ABNORMAL HIGH (ref 0.0–40.0)

## 2016-11-10 LAB — CBC
HCT: 38 % (ref 36.0–46.0)
Hemoglobin: 12.9 g/dL (ref 12.0–15.0)
MCHC: 34.1 g/dL (ref 30.0–36.0)
MCV: 83.8 fl (ref 78.0–100.0)
Platelets: 256 10*3/uL (ref 150.0–400.0)
RBC: 4.53 Mil/uL (ref 3.87–5.11)
RDW: 14.8 % (ref 11.5–15.5)
WBC: 4.8 10*3/uL (ref 4.0–10.5)

## 2016-11-10 LAB — LDL CHOLESTEROL, DIRECT: Direct LDL: 103 mg/dL

## 2016-11-10 MED ORDER — OMEPRAZOLE 20 MG PO CPDR: 20.0000 mg | DELAYED_RELEASE_CAPSULE | Freq: Every day | ORAL | 3 refills | Status: DC

## 2016-11-10 NOTE — Progress Notes (Signed)
Pre visit review using our clinic review tool, if applicable. No additional management support is needed unless otherwise documented below in the visit note. 

## 2016-11-10 NOTE — Patient Instructions (Signed)

## 2016-11-10 NOTE — Progress Notes (Addendum)
Patient ID: Heather Walker, female    DOB: 12/02/1960  Age: 56 y.o. MRN: [id number]     I acted as a Education administrator for Dr. Gerrit Heck, CMA Subjective:  Subjective  HPI [name] presents for f/u dm, htn and hyperlipidemia.  HPI HYPERTENSION   Blood pressure range-not checking Chest pain- no      Dyspnea- no Lightheadedness- no   Edema- no change Other side effects - no   Medication compliance: good Low salt diet- yes    DIABETES    Blood Sugar ranges-good per pt  Polyuria- no New Visual problems- no  Hypoglycemic symptoms- no  Other side effects-no Medication compliance - good Last eye exam- due Foot exam- today   HYPERLIPIDEMIA  Medication compliance- good RUQ pain- no  Muscle aches- no Other side effects-no  Review of Systems  Constitutional: Negative.  Negative for appetite change, diaphoresis, fatigue and unexpected weight change.  HENT: Negative.   Eyes: Negative.  Negative for pain, redness and visual disturbance.  Respiratory: Negative.  Negative for cough, chest tightness, shortness of breath and wheezing.   Cardiovascular: Negative.  Negative for chest pain, palpitations and leg swelling.  Gastrointestinal: Negative.   Endocrine: Negative.  Negative for cold intolerance, heat intolerance, polydipsia, polyphagia and polyuria.  Genitourinary: Negative.  Negative for difficulty urinating, dysuria and frequency.  Musculoskeletal: Negative.   Skin: Negative.   Allergic/Immunologic: Negative.   Neurological: Negative.  Negative for dizziness, light-headedness, numbness and headaches.  Hematological: Negative.   Psychiatric/Behavioral: Negative.     History Past Medical History:  Diagnosis Date  . Arthritis   . Asthma   . Depression   . Diabetes mellitus without complication ([place])   . Heart murmur   . Hypertension     She has a past surgical history that includes Abdominal hysterectomy; Bladder suspension; Appendectomy; Cesarean section; and  Tubal ligation.   Her family history includes Aneurysm in her sister; Asthma in her mother, sister, sister, and sister; Heart defect in her sister; Hypertension in her maternal grandmother, mother, and sister.She reports that she has never smoked. She has never used smokeless tobacco. She reports that she does not drink alcohol or use drugs.  Current Outpatient Prescriptions on File Prior to Visit  Medication Sig Dispense Refill  . albuterol (PROVENTIL HFA;VENTOLIN HFA) 108 (90 BASE) MCG/ACT inhaler Inhale 2 puffs into the lungs every 6 (six) hours as needed. For shortness of breath. 1 Inhaler 4  . albuterol (PROVENTIL) (2.5 MG/3ML) 0.083% nebulizer solution Take 3 mLs (2.5 mg total) by nebulization every 6 (six) hours as needed for wheezing or shortness of breath. 150 mL 1  . aspirin EC 81 MG EC tablet Take 1 tablet (81 mg total) by mouth daily.    . cyclobenzaprine (FLEXERIL) 5 MG tablet Take 1 tablet (5 mg total) by mouth 3 (three) times daily as needed for muscle spasms. 30 tablet 1  . diclofenac (VOLTAREN) 75 MG EC tablet Take 1 tablet (75 mg total) by mouth 2 (two) times daily. 60 tablet 1  . glucose blood (ONETOUCH VERIO) test strip Check blood sugar twice daily. Dx: E11.9 100 each 12  . Lancets MISC Check Blood sugar once a day. 100 each 5  . neomycin-polymyxin-hydrocortisone (CORTISPORIN) 3.5-10000-1 ophthalmic suspension Place 3 drops into both eyes 4 (four) times daily. 7.5 mL 0  . ONETOUCH DELICA LANCETS FINE MISC Check blood sugar once daily 50 each 11  . traMADol (ULTRAM) 50 MG tablet take 1 tablet by mouth every  6 hours if needed for pain 90 tablet 0   No current facility-administered medications on file prior to visit.      Objective:  Objective  Physical Exam  Constitutional: She is oriented to person, place, and time. She appears well-developed and well-nourished. No distress.  HENT:  Head: Normocephalic and atraumatic.  Right Ear: External ear normal.  Left Ear: External  ear normal.  Nose: Nose normal.  Mouth/Throat: Oropharynx is clear and moist.  Eyes: Conjunctivae and EOM are normal. Pupils are equal, round, and reactive to light.  Neck: Normal range of motion. Neck supple. No JVD present. Carotid bruit is not present. No thyromegaly present.  Cardiovascular: Normal rate, regular rhythm and normal heart sounds.   No murmur heard. Pulmonary/Chest: Effort normal and breath sounds normal. No respiratory distress. She has no wheezes. She has no rales. She exhibits no tenderness.  Abdominal: Soft. Bowel sounds are normal. She exhibits no distension and no mass. There is no tenderness. There is no rebound and no guarding.  Musculoskeletal: She exhibits no edema.  Neurological: She is alert and oriented to person, place, and time.  Skin: Skin is warm and dry. No rash noted. No erythema. No pallor.  Psychiatric: She has a normal mood and affect. Her behavior is normal. Judgment and thought content normal.  Nursing note and vitals reviewed.  BP 117/85 (BP Location: Right Arm, Cuff Size: Large)   Pulse 80   Temp 98.4 F (36.9 C) (Oral)   Resp 16   Ht 5\' 3"  (1.6 m)   Wt 209 lb 3.2 oz (94.9 kg)   SpO2 98%   BMI 37.06 kg/m  Wt Readings from Last 3 Encounters:  11/10/16 209 lb 3.2 oz (94.9 kg)  06/07/16 206 lb 12.8 oz (93.8 kg)  05/24/16 212 lb 3.2 oz (96.3 kg)     Lab Results  Component Value Date   WBC 4.8 11/10/2016   HGB 12.9 11/10/2016   HCT 38.0 11/10/2016   PLT 256.0 11/10/2016   GLUCOSE 258 (H) 11/10/2016   CHOL 193 11/10/2016   TRIG 207.0 (H) 11/10/2016   HDL 57.30 11/10/2016   LDLDIRECT 103.0 11/10/2016   LDLCALC 94 05/09/2016   ALT 13 11/10/2016   AST 14 11/10/2016   NA 137 11/10/2016   K 3.3 (L) 11/10/2016   CL 100 11/10/2016   CREATININE 1.13 11/10/2016   BUN 16 11/10/2016   CO2 27 11/10/2016   TSH 1.124 12/08/2014   HGBA1C 9.7 (H) 11/10/2016   MICROALBUR 1.3 11/10/2016    Dg Ankle Complete Right  Result Date:  05/09/2016 CLINICAL DATA:  Lateral ankle pain for a week.  No known injury. EXAM: RIGHT ANKLE - COMPLETE 3+ VIEW COMPARISON:  None. FINDINGS: No acute bony abnormality. Specifically, no fracture, subluxation, or dislocation. Soft tissues are intact. Soft tissue calcifications likely within the plantar fascia near a calcaneal bone spur. IMPRESSION: No acute bony abnormality. Electronically Signed   By: Rolm Baptise M.D.   On: 05/09/2016 12:51    Assessment & Plan:  Plan  I have changed Ms. Dudzinski's triamterene-hydrochlorothiazide and cloNIDine. I am also having her maintain her aspirin, cyclobenzaprine, albuterol, albuterol, Lancets, glucose blood, ONETOUCH DELICA LANCETS FINE, diclofenac, neomycin-polymyxin-hydrocortisone, traMADol, traZODone, saxagliptin HCl, Potassium Chloride ER, omeprazole, metFORMIN, and losartan.  Meds ordered this encounter  Medications  . DISCONTD: omeprazole (PRILOSEC) 20 MG capsule    Sig: Take 1 capsule (20 mg total) by mouth daily.    Dispense:  30 capsule  Refill:  3  . triamterene-hydrochlorothiazide (MAXZIDE) 75-50 MG tablet    Sig: Take 1 tablet by mouth daily.    Dispense:  90 tablet    Refill:  0  . traZODone (DESYREL) 50 MG tablet    Sig: Take 0.5-1 tablets (25-50 mg total) by mouth at bedtime as needed for sleep.    Dispense:  30 tablet    Refill:  3  . saxagliptin HCl (ONGLYZA) 5 MG TABS tablet    Sig: Take 1 tablet (5 mg total) by mouth daily.    Dispense:  30 tablet    Refill:  2  . Potassium Chloride ER 20 MEQ TBCR    Sig: Take 40 mEq by mouth daily.    Dispense:  60 tablet    Refill:  2    This is a new dose based on labs please discontinue previous RX.  Marland Kitchen omeprazole (PRILOSEC) 20 MG capsule    Sig: Take 1 capsule (20 mg total) by mouth daily.    Dispense:  30 capsule    Refill:  3  . metFORMIN (GLUCOPHAGE XR) 500 MG 24 hr tablet    Sig: Take 2 tablets (1,000 mg total) by mouth daily with breakfast.    Dispense:  180 tablet    Refill:   1    This is a new RX based on labs please discontinue the last RX.  Marland Kitchen losartan (COZAAR) 100 MG tablet    Sig: Take 1 tablet (100 mg total) by mouth daily.    Dispense:  90 tablet    Refill:  3  . cloNIDine (CATAPRES) 0.2 MG tablet    Sig: Take 1 tablet (0.2 mg total) by mouth 2 (two) times daily.    Dispense:  180 tablet    Refill:  1    Problem List Items Addressed This Visit      Unprioritized   Diabetes mellitus ([place])   Relevant Medications   saxagliptin HCl (ONGLYZA) 5 MG TABS tablet   metFORMIN (GLUCOPHAGE XR) 500 MG 24 hr tablet   losartan (COZAAR) 100 MG tablet   Other Relevant Orders   Comprehensive metabolic panel (Completed)   HgB A1c   Urine Microalbumin w/creat. ratio (Completed)   Diabetes mellitus type II, uncontrolled ([facility])   Relevant Medications   saxagliptin HCl (ONGLYZA) 5 MG TABS tablet   metFORMIN (GLUCOPHAGE XR) 500 MG 24 hr tablet   losartan (COZAAR) 100 MG tablet   Essential hypertension   Relevant Medications   triamterene-hydrochlorothiazide (MAXZIDE) 75-50 MG tablet   Potassium Chloride ER 20 MEQ TBCR   losartan (COZAAR) 100 MG tablet   cloNIDine (CATAPRES) 0.2 MG tablet   Other Relevant Orders   Comprehensive metabolic panel (Completed)   CBC (Completed)   Urine Microalbumin w/creat. ratio (Completed)   Preventative health care    See AVS Check labs  ghm utd       Other Visit Diagnoses    Medicare annual wellness visit, subsequent    -  Primary   Relevant Orders   Ambulatory referral to Gastroenterology   Breast cancer screening, high risk patient       Relevant Orders   MM SCREENING BREAST TOMO BILATERAL (Completed)   Hyperlipidemia LDL goal <70       Relevant Medications   triamterene-hydrochlorothiazide (MAXZIDE) 75-50 MG tablet   losartan (COZAAR) 100 MG tablet   cloNIDine (CATAPRES) 0.2 MG tablet   Other Relevant Orders   Comprehensive metabolic panel (Completed)   HgB A1c  Urine Microalbumin w/creat. ratio (Completed)    Lipid panel (Completed)   LDL cholesterol, direct (Completed)   At high risk for breast cancer       Relevant Orders   MM SCREENING BREAST TOMO BILATERAL (Completed)   Insomnia, unspecified type       Relevant Medications   traZODone (DESYREL) 50 MG tablet   Depression, unspecified depression type       Relevant Medications   traZODone (DESYREL) 50 MG tablet   losartan (COZAAR) 100 MG tablet   Gastroesophageal reflux disease, esophagitis presence not specified       Relevant Medications   omeprazole (PRILOSEC) 20 MG capsule    CMA served as scribe during this visit. History, Physical and Plan performed by medical provider. Documentation and orders reviewed and attested to.   Follow-up: Return in about 6 months (around 05/10/2017) for hypertension, hyperlipidemia, diabetes II.  [name], DO

## 2016-11-11 ENCOUNTER — Other Ambulatory Visit: Payer: Self-pay | Admitting: Family Medicine

## 2016-11-11 ENCOUNTER — Telehealth: Payer: Self-pay

## 2016-11-11 DIAGNOSIS — R739 Hyperglycemia, unspecified: Secondary | ICD-10-CM

## 2016-11-11 DIAGNOSIS — I10 Essential (primary) hypertension: Secondary | ICD-10-CM

## 2016-11-11 LAB — HEMOGLOBIN A1C
Hgb A1c MFr Bld: 9.7 % — ABNORMAL HIGH (ref <5.7)
Mean Plasma Glucose: 232 mg/dL

## 2016-11-11 MED ORDER — DAPAGLIFLOZIN PROPANEDIOL 5 MG PO TABS: ORAL_TABLET | ORAL | 2 refills | Status: DC

## 2016-11-11 NOTE — Telephone Encounter (Signed)
Spoke with pt, pt state she understand her results, new medication instructions, and follow up appointment with lab in 3 mth. Sent new Rx to pharmacy. Pt had no further questions or comments. LB

## 2016-11-14 ENCOUNTER — Encounter: Payer: Self-pay | Admitting: Family Medicine

## 2016-11-16 ENCOUNTER — Other Ambulatory Visit: Payer: Self-pay | Admitting: Family Medicine

## 2016-11-16 MED ORDER — ATORVASTATIN CALCIUM 20 MG PO TABS: 20.0000 mg | ORAL_TABLET | Freq: Every day | ORAL | 2 refills | Status: DC

## 2016-11-20 DIAGNOSIS — Z0000 Encounter for general adult medical examination without abnormal findings: Secondary | ICD-10-CM | POA: Insufficient documentation

## 2016-11-20 MED ORDER — TRAZODONE HCL 50 MG PO TABS: 25.0000 mg | ORAL_TABLET | Freq: Every evening | ORAL | 3 refills | Status: DC | PRN

## 2016-11-20 MED ORDER — OMEPRAZOLE 20 MG PO CPDR: 20.0000 mg | DELAYED_RELEASE_CAPSULE | Freq: Every day | ORAL | 3 refills | Status: DC

## 2016-11-20 MED ORDER — POTASSIUM CHLORIDE ER 20 MEQ PO TBCR: 40.0000 meq | EXTENDED_RELEASE_TABLET | Freq: Every day | ORAL | 2 refills | Status: DC

## 2016-11-20 MED ORDER — SAXAGLIPTIN HCL 5 MG PO TABS: 5.0000 mg | ORAL_TABLET | Freq: Every day | ORAL | 2 refills | Status: DC

## 2016-11-20 MED ORDER — METFORMIN HCL ER 500 MG PO TB24: 1000.0000 mg | ORAL_TABLET | Freq: Every day | ORAL | 1 refills | Status: DC

## 2016-11-20 MED ORDER — TRIAMTERENE-HCTZ 75-50 MG PO TABS: 1.0000 | ORAL_TABLET | Freq: Every day | ORAL | 0 refills | Status: DC

## 2016-11-20 MED ORDER — LOSARTAN POTASSIUM 100 MG PO TABS: 100.0000 mg | ORAL_TABLET | Freq: Every day | ORAL | 3 refills | Status: DC

## 2016-11-20 MED ORDER — CLONIDINE HCL 0.2 MG PO TABS: 0.2000 mg | ORAL_TABLET | Freq: Two times a day (BID) | ORAL | 1 refills | Status: DC

## 2016-11-20 NOTE — Assessment & Plan Note (Signed)
See AVS Check labs  ghm utd

## 2016-11-24 ENCOUNTER — Telehealth: Payer: Self-pay | Admitting: Family Medicine

## 2016-11-24 NOTE — Telephone Encounter (Signed)
Insurance will pay for [employer] or Jardiance.  [facility] is non formulary.

## 2016-11-24 NOTE — Telephone Encounter (Signed)
[employer] called stated that the medication dapagliflozin propanediol (FARXIGA) 5 MG TABS tablet Is not covered by the patient's insurance plan. They are faxing over documentation with approved alternatives. Please advise  [facility] phone: [phone]

## 2016-11-24 NOTE — Telephone Encounter (Signed)
jardiance 10 mg #30  1 po qd,  2 refills Recheck labs 3 months

## 2016-11-28 MED ORDER — EMPAGLIFLOZIN 10 MG PO TABS: 10.0000 mg | ORAL_TABLET | Freq: Every day | ORAL | 2 refills | Status: DC

## 2016-11-28 NOTE — Telephone Encounter (Signed)
rx sent to pharmacy and patient notified.  She asked about cologuard.  Form sent to cologuard.

## 2016-11-29 DIAGNOSIS — E119 Type 2 diabetes mellitus without complications: Secondary | ICD-10-CM | POA: Diagnosis not present

## 2016-11-29 LAB — HM DIABETES EYE EXAM

## 2016-12-02 ENCOUNTER — Telehealth: Payer: Self-pay | Admitting: Family Medicine

## 2016-12-02 NOTE — Telephone Encounter (Signed)
Patient called stating that the atorvastatin (LIPITOR) 20 MG tablet She has started taking is causing painful stiffness in her neck. Please advise  Phone: [phone]

## 2016-12-05 MED ORDER — PRAVASTATIN SODIUM 20 MG PO TABS: 20.0000 mg | ORAL_TABLET | Freq: Every day | ORAL | 3 refills | Status: DC

## 2016-12-05 NOTE — Telephone Encounter (Signed)
D/c lipitor pravachol 20 mg #30  1 po qhs prn , 2 refills

## 2016-12-05 NOTE — Telephone Encounter (Signed)
D/c lipitor Sent in pravachol as instructed. The patient has been informed Added liptor to her intolerance/allergy list.

## 2016-12-06 ENCOUNTER — Encounter: Payer: Self-pay | Admitting: Family Medicine

## 2016-12-12 ENCOUNTER — Encounter: Payer: Self-pay | Admitting: Family Medicine

## 2016-12-12 LAB — COLOGUARD

## 2016-12-13 ENCOUNTER — Encounter: Payer: Self-pay | Admitting: Family Medicine

## 2017-01-03 ENCOUNTER — Ambulatory Visit: Payer: Medicare Other | Admitting: Family Medicine

## 2017-01-06 ENCOUNTER — Other Ambulatory Visit: Payer: Self-pay | Admitting: Family Medicine

## 2017-01-06 DIAGNOSIS — E1165 Type 2 diabetes mellitus with hyperglycemia: Principal | ICD-10-CM

## 2017-01-06 DIAGNOSIS — IMO0001 Reserved for inherently not codable concepts without codable children: Secondary | ICD-10-CM

## 2017-02-09 ENCOUNTER — Other Ambulatory Visit: Payer: Medicare Other

## 2017-04-04 ENCOUNTER — Emergency Department (HOSPITAL_COMMUNITY)
Admission: EM | Admit: 2017-04-04 | Discharge: 2017-04-04 | Disposition: A | Discharge status: Home / Self Care | Payer: Medicare Other | Attending: Emergency Medicine | Admitting: Emergency Medicine

## 2017-04-04 ENCOUNTER — Encounter (HOSPITAL_COMMUNITY): Payer: Self-pay | Admitting: Emergency Medicine

## 2017-04-04 ENCOUNTER — Emergency Department (HOSPITAL_COMMUNITY): Payer: Medicare Other

## 2017-04-04 DIAGNOSIS — Z79899 Other long term (current) drug therapy: Secondary | ICD-10-CM | POA: Insufficient documentation

## 2017-04-04 DIAGNOSIS — I10 Essential (primary) hypertension: Secondary | ICD-10-CM | POA: Insufficient documentation

## 2017-04-04 DIAGNOSIS — J45909 Unspecified asthma, uncomplicated: Secondary | ICD-10-CM | POA: Diagnosis not present

## 2017-04-04 DIAGNOSIS — R079 Chest pain, unspecified: Secondary | ICD-10-CM | POA: Diagnosis not present

## 2017-04-04 DIAGNOSIS — Z7984 Long term (current) use of oral hypoglycemic drugs: Secondary | ICD-10-CM | POA: Insufficient documentation

## 2017-04-04 DIAGNOSIS — Z7982 Long term (current) use of aspirin: Secondary | ICD-10-CM | POA: Diagnosis not present

## 2017-04-04 DIAGNOSIS — E119 Type 2 diabetes mellitus without complications: Secondary | ICD-10-CM | POA: Diagnosis not present

## 2017-04-04 DIAGNOSIS — R0789 Other chest pain: Secondary | ICD-10-CM | POA: Insufficient documentation

## 2017-04-04 LAB — BASIC METABOLIC PANEL
Anion gap: 12 (ref 5–15)
BUN: 14 mg/dL (ref 6–20)
CO2: 24 mmol/L (ref 22–32)
Calcium: 8.7 mg/dL — ABNORMAL LOW (ref 8.9–10.3)
Chloride: 100 mmol/L — ABNORMAL LOW (ref 101–111)
Creatinine, Ser: 1.05 mg/dL — ABNORMAL HIGH (ref 0.44–1.00)
GFR calc Af Amer: >60 mL/min (ref >60)
GFR calc non Af Amer: 59 mL/min — ABNORMAL LOW (ref >60)
Glucose, Bld: 280 mg/dL — ABNORMAL HIGH (ref 65–99)
Potassium: 3.3 mmol/L — ABNORMAL LOW (ref 3.5–5.1)
Sodium: 136 mmol/L (ref 135–145)

## 2017-04-04 LAB — CBC
HCT: 37.3 % (ref 36.0–46.0)
Hemoglobin: 12.3 g/dL (ref 12.0–15.0)
MCH: 27.6 pg (ref 26.0–34.0)
MCHC: 33 g/dL (ref 30.0–36.0)
MCV: 83.6 fL (ref 78.0–100.0)
Platelets: 239 10*3/uL (ref 150–400)
RBC: 4.46 MIL/uL (ref 3.87–5.11)
RDW: 14.3 % (ref 11.5–15.5)
WBC: 5.2 10*3/uL (ref 4.0–10.5)

## 2017-04-04 LAB — I-STAT TROPONIN, ED: Troponin i, poc: 0 ng/mL (ref 0.00–0.08)

## 2017-04-04 MED ORDER — ALBUTEROL SULFATE HFA 108 (90 BASE) MCG/ACT IN AERS
2.0000 | INHALATION_SPRAY | RESPIRATORY_TRACT | Status: DC
  Administered 2017-04-04: 2 via RESPIRATORY_TRACT
  Filled 2017-04-04: qty 6.7

## 2017-04-04 MED ORDER — GI COCKTAIL ~~LOC~~
30.0000 mL | Freq: Once | ORAL | Status: AC
  Administered 2017-04-04: 30 mL via ORAL
  Filled 2017-04-04: qty 30

## 2017-04-04 MED ORDER — KETOROLAC TROMETHAMINE 15 MG/ML IJ SOLN
15.0000 mg | Freq: Once | INTRAMUSCULAR | Status: AC
  Administered 2017-04-04: 15 mg via INTRAVENOUS
  Filled 2017-04-04: qty 1

## 2017-04-04 MED ORDER — ACETAMINOPHEN 325 MG PO TABS
650.0000 mg | ORAL_TABLET | Freq: Once | ORAL | Status: AC
  Administered 2017-04-04: 650 mg via ORAL
  Filled 2017-04-04: qty 2

## 2017-04-04 NOTE — ED Provider Notes (Signed)
[facility] DEPT Provider Note   CSN: [id number] Arrival date & time: 04/04/17  4656     History   Chief Complaint Chief Complaint  Patient presents with  . Chest Pain    HPI Heather Walker is a 56 y.o. female.  HPI Patient presents the emergency department several days of constant left-sided chest discomfort and chest pressure.  She states it continued this morning when she woke that she came to the ER for evaluation.  She does have a history of asthma but is currently without an inhaler.  She denies significant shortness of breath.  Denies nausea vomiting.  No diarrhea.  No fevers or chills.   Past Medical History:  Diagnosis Date  . Arthritis   . Asthma   . Depression   . Diabetes mellitus without complication ([place])   . Heart murmur   . Hypertension     Patient Active Problem List   Diagnosis Date Noted  . Preventative health care 11/20/2016  . Right ankle pain 05/09/2016  . Palpitations 12/08/2014  . Diabetes mellitus type II, uncontrolled ([place]) 08/03/2014  . Leg wound, right 08/03/2014  . Hyperglycemia 07/13/2014  . Chest pain 07/12/2014  . Diabetes mellitus ([place]) 07/12/2014  . Acute kidney injury ([place]) 07/12/2014  . Abdominal pain, right upper quadrant 05/28/2014  . Tachycardia 05/03/2013  . Abscess 11/13/2012  . Breast pain, right 08/10/2012  . OA (osteoarthritis) 03/04/2011  . DEPRESSION, MAJOR, RECURRENT, SEVERE 12/22/2009  . BACK PAIN, LUMBAR, WITH RADICULOPATHY 09/17/2009  . CALF PAIN, RIGHT 09/17/2009  . RENAL CYST 08/28/2009  . LOW BACK PAIN, ACUTE 11/13/2008  . FIBROIDS, UTERUS 09/10/2007  . UTI 09/03/2007  . BACTERIAL VAGINITIS 09/03/2007  . PELVIC PAIN, RIGHT 09/03/2007  . HYPOKALEMIA, HX OF 09/03/2007  . MOLE 06/21/2007  . ALLERGIC RHINITIS 06/21/2007  . HEMOCCULT POSITIVE STOOL 06/21/2007  . SINUSITIS, ACUTE NOS 04/05/2007  . REFLUX, ESOPHAGEAL 04/05/2007  . CHEST PAIN 04/05/2007  . Essential hypertension 03/19/2007  . ALLERGIC  RHINITIS, SEASONAL 03/09/2007  . ASTHMA 03/09/2007    Past Surgical History:  Procedure Laterality Date  . ABDOMINAL HYSTERECTOMY    . APPENDECTOMY    . BLADDER SUSPENSION    . CESAREAN SECTION     3 previous  . TUBAL LIGATION      OB History    Gravida Para Term Preterm AB Living   3 3 3     3    SAB TAB Ectopic Multiple Live Births                   Home Medications    Prior to Admission medications   Medication Sig Start Date End Date Taking? Authorizing Provider  albuterol (PROVENTIL HFA;VENTOLIN HFA) 108 (90 BASE) MCG/ACT inhaler Inhale 2 puffs into the lungs every 6 (six) hours as needed. For shortness of breath. 02/27/15  Yes Roma Schanz R, DO  albuterol (PROVENTIL) (2.5 MG/3ML) 0.083% nebulizer solution Take 3 mLs (2.5 mg total) by nebulization every 6 (six) hours as needed for wheezing or shortness of breath. 10/16/14  Yes Roma Schanz R, DO  cloNIDine (CATAPRES) 0.2 MG tablet Take 1 tablet (0.2 mg total) by mouth 2 (two) times daily. 11/20/16  Yes [name], DO  cyclobenzaprine (FLEXERIL) 5 MG tablet Take 1 tablet (5 mg total) by mouth 3 (three) times daily as needed for muscle spasms. 09/18/14  Yes Roma Schanz R, DO  dapagliflozin propanediol (FARXIGA) 5 MG TABS tablet Take 1 tablet  po qid daily. Patient taking differently: Take 5 mg by mouth daily.  11/11/16  Yes [name], DO  diclofenac (VOLTAREN) 75 MG EC tablet Take 1 tablet (75 mg total) by mouth 2 (two) times daily. 05/11/16  Yes Hudnall, Sharyn Lull, MD  empagliflozin (JARDIANCE) 10 MG TABS tablet Take 10 mg by mouth daily. 11/28/16  Yes Roma Schanz R, DO  glucose blood (ONETOUCH VERIO) test strip Check blood sugar twice daily. Dx: E11.9 05/09/16  Yes Roma Schanz R, DO  Lancets MISC Check Blood sugar once a day. 03/08/16  Yes Roma Schanz R, DO  losartan (COZAAR) 100 MG tablet Take 1 tablet (100 mg total) by mouth daily. 11/20/16  Yes Roma Schanz R,  DO  omeprazole (PRILOSEC) 20 MG capsule Take 1 capsule (20 mg total) by mouth daily. 11/20/16  Yes Carollee Herter, Alferd Apa, DO  [facility] DELICA LANCETS FINE MISC Check blood sugar once daily 05/10/16  Yes Lowne Koren Shiver, DO  Potassium Chloride ER 20 MEQ TBCR Take 40 mEq by mouth daily. 11/20/16  Yes Roma Schanz R, DO  pravastatin (PRAVACHOL) 20 MG tablet Take 1 tablet (20 mg total) by mouth daily. 12/05/16  Yes Roma Schanz R, DO  saxagliptin HCl (ONGLYZA) 5 MG TABS tablet Take 1 tablet (5 mg total) by mouth daily. 11/20/16  Yes [name], DO  traMADol (ULTRAM) 50 MG tablet take 1 tablet by mouth every 6 hours if needed for pain 10/06/16  Yes Mosie Lukes, MD  traZODone (DESYREL) 50 MG tablet Take 0.5-1 tablets (25-50 mg total) by mouth at bedtime as needed for sleep. 11/20/16  Yes Roma Schanz R, DO  triamterene-hydrochlorothiazide (MAXZIDE) 75-50 MG tablet Take 1 tablet by mouth daily. 11/20/16  Yes [name], DO  aspirin EC 81 MG EC tablet Take 1 tablet (81 mg total) by mouth daily. Patient not taking: Reported on 04/04/2017 07/15/14   Eugenie Filler, MD  metFORMIN (GLUCOPHAGE XR) 500 MG 24 hr tablet Take 2 tablets (1,000 mg total) by mouth daily with breakfast. Patient not taking: Reported on 04/04/2017 11/20/16   Carollee Herter, Alferd Apa, DO  metFORMIN (GLUCOPHAGE-XR) 500 MG 24 hr tablet take 2 tablets by mouth once daily with BREAKFAST Patient not taking: Reported on 04/04/2017 01/06/17   [name], DO  neomycin-polymyxin-hydrocortisone (CORTISPORIN) 3.5-10000-1 ophthalmic suspension Place 3 drops into both eyes 4 (four) times daily. Patient not taking: Reported on 04/04/2017 06/07/16   [name], DO    Family History Family History  Problem Relation Age of Onset  . Hypertension Mother   . Asthma Mother   . Hypertension Sister   . Asthma Sister   . Hypertension Maternal Grandmother   . Heart defect Sister   . Asthma Sister     . Aneurysm Sister   . Asthma Sister     Social History Social History  Substance Use Topics  . Smoking status: Never Smoker  . Smokeless tobacco: Never Used  . Alcohol use No     Allergies   Atorvastatin and Codeine   Review of Systems Review of Systems  All other systems reviewed and are negative.    Physical Exam Updated Vital Signs BP 110/81   Pulse (!) 59   Temp 97.6 F (36.4 C) (Oral)   Resp 16   Wt 94.8 kg (209 lb)   SpO2 98%   BMI 37.02 kg/m  Physical Exam  Constitutional: She is oriented to person, place, and time. She appears well-developed and well-nourished. No distress.  HENT:  Head: Normocephalic and atraumatic.  Eyes: EOM are normal.  Neck: Normal range of motion.  Cardiovascular: Normal rate, regular rhythm and normal heart sounds.   Pulmonary/Chest: Effort normal and breath sounds normal.  Abdominal: Soft. She exhibits no distension. There is no tenderness.  Musculoskeletal: Normal range of motion.  Neurological: She is alert and oriented to person, place, and time.  Skin: Skin is warm and dry.  Psychiatric: She has a normal mood and affect. Judgment normal.  Nursing note and vitals reviewed.    ED Treatments / Results  Labs (all labs ordered are listed, but only abnormal results are displayed) Labs Reviewed  BASIC METABOLIC PANEL - Abnormal; Notable for the following:       Result Value   Potassium 3.3 (*)    Chloride 100 (*)    Glucose, Bld 280 (*)    Creatinine, Ser 1.05 (*)    Calcium 8.7 (*)    GFR calc non Af Amer 59 (*)    All other components within normal limits  CBC  I-STAT TROPOININ, ED    EKG  EKG Interpretation  Date/Time:  Tuesday April 04 2017 08:24:03 EDT Ventricular Rate:  75 PR Interval:  186 QRS Duration: 90 QT Interval:  400 QTC Calculation: 446 R Axis:   -55 Text Interpretation:  Normal sinus rhythm Left anterior fascicular block Abnormal ECG No significant change was found Confirmed by Jola Schmidt  [phone]) on 04/04/2017 9:26:36 AM       Radiology Dg Chest 2 View  Result Date: 04/04/2017 CLINICAL DATA:  Left-sided chest pain, axillary pain, short of breath for 2 days EXAM: CHEST  2 VIEW COMPARISON:  Chest x-ray of 02/18/2016 FINDINGS: No active infiltrate or effusion is seen. Mediastinal and hilar contours are unremarkable. The heart is within normal limits in size. There are mild degenerative changes in the mid to lower thoracic spine IMPRESSION: No active cardiopulmonary disease. Electronically Signed   By: Ivar Drape M.D.   On: 04/04/2017 10:24    Procedures Procedures (including critical care time)  Medications Ordered in ED Medications  albuterol (PROVENTIL HFA;VENTOLIN HFA) 108 (90 Base) MCG/ACT inhaler 2 puff (2 puffs Inhalation Given 04/04/17 1029)  ketorolac (TORADOL) 15 MG/ML injection 15 mg (15 mg Intravenous Given 04/04/17 0937)  acetaminophen (TYLENOL) tablet 650 mg (650 mg Oral Given 04/04/17 0937)  gi cocktail (Maalox,Lidocaine,Donnatal) (30 mLs Oral Given 04/04/17 3614)     Initial Impression / Assessment and Plan / ED Course  I have reviewed the triage vital signs and the nursing notes.  Pertinent labs & imaging results that were available during my care of the patient were reviewed by me and considered in my medical decision making (see chart for details).     Patient is overall well-appearing.  Primary care follow-up.  Doubt ACS.  Doubt PE.  Patient understands return to the ER for new or worsening symptoms.   Final Clinical Impressions(s) / ED Diagnoses   Final diagnoses:  None    New Prescriptions New Prescriptions   No medications on file     Jola Schmidt, MD 04/04/17 1345

## 2017-04-04 NOTE — ED Triage Notes (Signed)
Pt in from home with c/o sharp central/L sided cp since waking this am. Pt denies nausea, dizziness or sob. Alert, VSS

## 2017-04-06 ENCOUNTER — Other Ambulatory Visit: Payer: Self-pay

## 2017-04-06 NOTE — Patient Outreach (Signed)
[facility]) Care Management  04/06/2017  ADRYANNA FRIEDT Sep 22, 1961 [id number]  Medication Adherence call to Mrs. Gaetana Kawahara call patient because she is showing past due on her pravastatin 20 mg patient said she already order medication and will pick up today (04/06/17) I also call [facility] and they have the medication ready for her.    [facility] Management Direct Dial [phone]  Fax [phone] [email or web address]

## 2017-04-08 ENCOUNTER — Other Ambulatory Visit: Payer: Self-pay | Admitting: Family Medicine

## 2017-05-02 ENCOUNTER — Ambulatory Visit (INDEPENDENT_AMBULATORY_CARE_PROVIDER_SITE_OTHER): Payer: Medicare Other | Admitting: Family Medicine

## 2017-05-02 ENCOUNTER — Encounter: Payer: Self-pay | Admitting: Family Medicine

## 2017-05-02 VITALS — BP 137/88 | HR 74 | Temp 98.4°F | Resp 16 | Ht 63.0 in | Wt 204.0 lb

## 2017-05-02 DIAGNOSIS — I10 Essential (primary) hypertension: Secondary | ICD-10-CM | POA: Diagnosis not present

## 2017-05-02 DIAGNOSIS — G8929 Other chronic pain: Secondary | ICD-10-CM | POA: Diagnosis not present

## 2017-05-02 DIAGNOSIS — G4700 Insomnia, unspecified: Secondary | ICD-10-CM | POA: Diagnosis not present

## 2017-05-02 DIAGNOSIS — M25511 Pain in right shoulder: Secondary | ICD-10-CM

## 2017-05-02 DIAGNOSIS — E1165 Type 2 diabetes mellitus with hyperglycemia: Secondary | ICD-10-CM | POA: Diagnosis not present

## 2017-05-02 DIAGNOSIS — E785 Hyperlipidemia, unspecified: Secondary | ICD-10-CM

## 2017-05-02 DIAGNOSIS — K219 Gastro-esophageal reflux disease without esophagitis: Secondary | ICD-10-CM

## 2017-05-02 DIAGNOSIS — IMO0002 Reserved for concepts with insufficient information to code with codable children: Secondary | ICD-10-CM

## 2017-05-02 DIAGNOSIS — E1151 Type 2 diabetes mellitus with diabetic peripheral angiopathy without gangrene: Secondary | ICD-10-CM

## 2017-05-02 MED ORDER — TRAMADOL HCL 50 MG PO TABS: ORAL_TABLET | ORAL | 0 refills | Status: DC

## 2017-05-02 MED ORDER — CYCLOBENZAPRINE HCL 5 MG PO TABS: 5.0000 mg | ORAL_TABLET | Freq: Three times a day (TID) | ORAL | 1 refills | Status: DC | PRN

## 2017-05-02 MED ORDER — OMEPRAZOLE 20 MG PO CPDR
20.0000 mg | DELAYED_RELEASE_CAPSULE | Freq: Every day | ORAL | 3 refills | Status: DC
Start: 2017-05-02 — End: 2018-01-29

## 2017-05-02 MED ORDER — CLONIDINE HCL 0.2 MG PO TABS: 0.2000 mg | ORAL_TABLET | Freq: Two times a day (BID) | ORAL | 1 refills | Status: DC

## 2017-05-02 MED ORDER — POTASSIUM CHLORIDE ER 20 MEQ PO TBCR: 40.0000 meq | EXTENDED_RELEASE_TABLET | Freq: Every day | ORAL | 2 refills | Status: DC

## 2017-05-02 MED ORDER — TRAZODONE HCL 50 MG PO TABS: 25.0000 mg | ORAL_TABLET | Freq: Every evening | ORAL | 3 refills | Status: DC | PRN

## 2017-05-02 MED ORDER — TRIAMTERENE-HCTZ 75-50 MG PO TABS: 1.0000 | ORAL_TABLET | Freq: Every day | ORAL | 0 refills | Status: DC

## 2017-05-02 MED ORDER — OLMESARTAN MEDOXOMIL 20 MG PO TABS: 20.0000 mg | ORAL_TABLET | Freq: Every day | ORAL | 2 refills | Status: DC

## 2017-05-02 NOTE — Patient Instructions (Signed)

## 2017-05-02 NOTE — Progress Notes (Signed)
Patient ID: Heather Walker, female   DOB: [birth year], 56 y.o.   MRN: [id number]     Subjective:  I acted as a Education administrator for Dr. Carollee Herter.  Heather Walker, [place]   Patient ID: Heather Walker, female    DOB: 1961/08/28, 56 y.o.   MRN: [id number]  Chief Complaint  Patient presents with  . Hypertension  . Hyperlipidemia  . Diabetes    HPI  Patient is in today for follow up blood pressure, cholesterol, and diabetes.  She has been doing well on current treatment for cholesterol with no side effects.  She has not been checking sugars as she should.  She has not been taking metformin because it causes her stomach upset and diarrhea. HYPERTENSION   Blood pressure range-running high at home as well   Chest pain- no      Dyspnea- no Lightheadedness- no   Edema- no  Other side effects - no   Medication compliance: good Low salt diet- yes    DIABETES    Blood Sugar ranges-not checking   Polyuria- no New Visual problems- no  Hypoglycemic symptoms- no  Other side effects-no Medication compliance - good Last eye exam- 12/2016 Foot exam- today   HYPERLIPIDEMIA  Medication compliance- good RUQ pain- no  Muscle aches- no Other side effects-no   Pt needs refills on several meds   Patient Care Team: [name], DO as PCP - General (Family Medicine)   Past Medical History:  Diagnosis Date  . Arthritis   . Asthma   . Depression   . Diabetes mellitus without complication ([place])   . Heart murmur   . Hypertension     Past Surgical History:  Procedure Laterality Date  . ABDOMINAL HYSTERECTOMY    . APPENDECTOMY    . BLADDER SUSPENSION    . CESAREAN SECTION     3 previous  . TUBAL LIGATION      Family History  Problem Relation Age of Onset  . Hypertension Mother   . Asthma Mother   . Hypertension Sister   . Asthma Sister   . Hypertension Maternal Grandmother   . Heart defect Sister   . Asthma Sister   . Aneurysm Sister   . Asthma Sister     Social History   Social  History  . Marital status: Married    Spouse name: N/A  . Number of children: N/A  . Years of education: ged   Occupational History  . disabled    Social History Main Topics  . Smoking status: Never Smoker  . Smokeless tobacco: Never Used  . Alcohol use No  . Drug use: No  . Sexual activity: Yes    Partners: Male    Birth control/ protection: Surgical   Other Topics Concern  . Not on file   Social History Narrative  . No narrative on file    Outpatient Medications Prior to Visit  Medication Sig Dispense Refill  . albuterol (PROVENTIL HFA;VENTOLIN HFA) 108 (90 BASE) MCG/ACT inhaler Inhale 2 puffs into the lungs every 6 (six) hours as needed. For shortness of breath. 1 Inhaler 4  . albuterol (PROVENTIL) (2.5 MG/3ML) 0.083% nebulizer solution Take 3 mLs (2.5 mg total) by nebulization every 6 (six) hours as needed for wheezing or shortness of breath. 150 mL 1  . aspirin EC 81 MG EC tablet Take 1 tablet (81 mg total) by mouth daily.    Marland Kitchen glucose blood (ONETOUCH VERIO) test strip Check blood sugar twice daily.  Dx: E11.9 100 each 12  . JARDIANCE 10 MG TABS tablet take 1 tablet by mouth once daily 30 tablet 2  . Lancets MISC Check Blood sugar once a day. 100 each 5  . ONETOUCH DELICA LANCETS FINE MISC Check blood sugar once daily 50 each 11  . pravastatin (PRAVACHOL) 20 MG tablet Take 1 tablet (20 mg total) by mouth daily. 30 tablet 3  . saxagliptin HCl (ONGLYZA) 5 MG TABS tablet Take 1 tablet (5 mg total) by mouth daily. 30 tablet 2  . cloNIDine (CATAPRES) 0.2 MG tablet Take 1 tablet (0.2 mg total) by mouth 2 (two) times daily. 180 tablet 1  . cyclobenzaprine (FLEXERIL) 5 MG tablet Take 1 tablet (5 mg total) by mouth 3 (three) times daily as needed for muscle spasms. 30 tablet 1  . losartan (COZAAR) 100 MG tablet Take 1 tablet (100 mg total) by mouth daily. 90 tablet 3  . omeprazole (PRILOSEC) 20 MG capsule Take 1 capsule (20 mg total) by mouth daily. 30 capsule 3  . Potassium  Chloride ER 20 MEQ TBCR Take 40 mEq by mouth daily. 60 tablet 2  . traMADol (ULTRAM) 50 MG tablet take 1 tablet by mouth every 6 hours if needed for pain 90 tablet 0  . traZODone (DESYREL) 50 MG tablet Take 0.5-1 tablets (25-50 mg total) by mouth at bedtime as needed for sleep. 30 tablet 3  . triamterene-hydrochlorothiazide (MAXZIDE) 75-50 MG tablet Take 1 tablet by mouth daily. 90 tablet 0  . dapagliflozin propanediol (FARXIGA) 5 MG TABS tablet Take 1 tablet po qid daily. (Patient taking differently: Take 5 mg by mouth daily. ) 30 tablet 2  . diclofenac (VOLTAREN) 75 MG EC tablet Take 1 tablet (75 mg total) by mouth 2 (two) times daily. 60 tablet 1  . metFORMIN (GLUCOPHAGE XR) 500 MG 24 hr tablet Take 2 tablets (1,000 mg total) by mouth daily with breakfast. (Patient not taking: Reported on 04/04/2017) 180 tablet 1  . metFORMIN (GLUCOPHAGE-XR) 500 MG 24 hr tablet take 2 tablets by mouth once daily with BREAKFAST (Patient not taking: Reported on 04/04/2017) 180 tablet 1  . neomycin-polymyxin-hydrocortisone (CORTISPORIN) 3.5-10000-1 ophthalmic suspension Place 3 drops into both eyes 4 (four) times daily. (Patient not taking: Reported on 04/04/2017) 7.5 mL 0   No facility-administered medications prior to visit.     Allergies  Allergen Reactions  . Atorvastatin Other (See Comments)    Neck stiffness  . Codeine Itching    Review of Systems  Constitutional: Negative for fever and malaise/fatigue.  HENT: Negative for congestion.   Eyes: Negative for blurred vision.  Respiratory: Negative for cough and shortness of breath.   Cardiovascular: Negative for chest pain, palpitations and leg swelling.  Gastrointestinal: Negative for vomiting.  Musculoskeletal: Negative for back pain.  Skin: Negative for rash.  Neurological: Negative for loss of consciousness and headaches.       Objective:    Physical Exam  Constitutional: She is oriented to person, place, and time. She appears well-developed  and well-nourished. No distress.  HENT:  Head: Normocephalic and atraumatic.  Eyes: Conjunctivae are normal.  Neck: Normal range of motion. No thyromegaly present.  Cardiovascular: Normal rate and regular rhythm.   Pulmonary/Chest: Effort normal and breath sounds normal. She has no wheezes.  Abdominal: Soft. Bowel sounds are normal. There is no tenderness.  Musculoskeletal: Normal range of motion. She exhibits no edema or deformity.  Neurological: She is alert and oriented to person, place, and time.  Skin:  Skin is warm and dry. She is not diaphoretic.  Psychiatric: She has a normal mood and affect.  Nursing note and vitals reviewed. .monofil  BP 137/88   Pulse 74   Temp 98.4 F (36.9 C) (Oral)   Resp 16   Ht 5\' 3"  (1.6 m)   Wt 204 lb (92.5 kg)   SpO2 97%   BMI 36.14 kg/m  Wt Readings from Last 3 Encounters:  05/02/17 204 lb (92.5 kg)  04/04/17 209 lb (94.8 kg)  11/10/16 209 lb 3.2 oz (94.9 kg)   BP Readings from Last 3 Encounters:  05/02/17 137/88  04/04/17 111/72  11/10/16 117/85     Immunization History  Administered Date(s) Administered  . Influenza,inj,Quad PF,36+ Mos 07/25/2014, 10/05/2015  . Pneumococcal Polysaccharide-23 07/15/2014  . Td 04/12/2006  . Tdap 02/24/2011    Health Maintenance  Topic Date Due  . COLONOSCOPY  11/10/2017 (Originally 10/26/2012)  . HEMOGLOBIN A1C  05/10/2017  . INFLUENZA VACCINE  05/17/2017  . FOOT EXAM  11/10/2017  . MAMMOGRAM  11/10/2017  . OPHTHALMOLOGY EXAM  11/29/2017  . PNEUMOCOCCAL POLYSACCHARIDE VACCINE (2) 07/16/2019  . TETANUS/TDAP  02/23/2021  . Hepatitis C Screening  Completed  . HIV Screening  Completed    Lab Results  Component Value Date   WBC 5.2 04/04/2017   HGB 12.3 04/04/2017   HCT 37.3 04/04/2017   PLT 239 04/04/2017   GLUCOSE 134 (H) 05/02/2017   CHOL 150 05/02/2017   TRIG 142.0 05/02/2017   HDL 55.20 05/02/2017   LDLDIRECT 103.0 11/10/2016   LDLCALC 66 05/02/2017   ALT 13 05/02/2017   AST 16  05/02/2017   NA 140 05/02/2017   K 3.6 05/02/2017   CL 102 05/02/2017   CREATININE 0.88 05/02/2017   BUN 14 05/02/2017   CO2 32 05/02/2017   TSH 1.124 12/08/2014   HGBA1C 9.7 (H) 05/02/2017   MICROALBUR 1.3 11/10/2016    Lab Results  Component Value Date   TSH 1.124 12/08/2014   Lab Results  Component Value Date   WBC 5.2 04/04/2017   HGB 12.3 04/04/2017   HCT 37.3 04/04/2017   MCV 83.6 04/04/2017   PLT 239 04/04/2017   Lab Results  Component Value Date   NA 140 05/02/2017   K 3.6 05/02/2017   CO2 32 05/02/2017   GLUCOSE 134 (H) 05/02/2017   BUN 14 05/02/2017   CREATININE 0.88 05/02/2017   BILITOT 0.5 05/02/2017   ALKPHOS 87 05/02/2017   AST 16 05/02/2017   ALT 13 05/02/2017   PROT 7.2 05/02/2017   ALBUMIN 3.8 05/02/2017   CALCIUM 9.2 05/02/2017   ANIONGAP 12 04/04/2017   GFR 85.95 05/02/2017   Lab Results  Component Value Date   CHOL 150 05/02/2017   Lab Results  Component Value Date   HDL 55.20 05/02/2017   Lab Results  Component Value Date   LDLCALC 66 05/02/2017   Lab Results  Component Value Date   TRIG 142.0 05/02/2017   Lab Results  Component Value Date   CHOLHDL 3 05/02/2017   Lab Results  Component Value Date   HGBA1C 9.7 (H) 05/02/2017         Assessment & Plan:   Problem List Items Addressed This Visit      Unprioritized   REFLUX, ESOPHAGEAL   Relevant Medications   omeprazole (PRILOSEC) 20 MG capsule   Essential hypertension - Primary    Well controlled, no changes to meds. Encouraged heart healthy diet such as the  DASH diet and exercise as tolerated.       Relevant Medications   olmesartan (BENICAR) 20 MG tablet   cloNIDine (CATAPRES) 0.2 MG tablet   Potassium Chloride ER 20 MEQ TBCR   triamterene-hydrochlorothiazide (MAXZIDE) 75-50 MG tablet   Other Relevant Orders   Hemoglobin A1c (Completed)   Comprehensive metabolic panel (Completed)   Lipid panel (Completed)    Other Visit Diagnoses    Hyperlipidemia LDL  goal <70       Relevant Medications   olmesartan (BENICAR) 20 MG tablet   cloNIDine (CATAPRES) 0.2 MG tablet   triamterene-hydrochlorothiazide (MAXZIDE) 75-50 MG tablet   Other Relevant Orders   Comprehensive metabolic panel (Completed)   Lipid panel (Completed)   DM (diabetes mellitus) type II uncontrolled, periph vascular disorder (HCC)       Relevant Medications   olmesartan (BENICAR) 20 MG tablet   cloNIDine (CATAPRES) 0.2 MG tablet   triamterene-hydrochlorothiazide (MAXZIDE) 75-50 MG tablet   Other Relevant Orders   Hemoglobin A1c (Completed)   Comprehensive metabolic panel (Completed)   Chronic right shoulder pain       Relevant Medications   cyclobenzaprine (FLEXERIL) 5 MG tablet   traMADol (ULTRAM) 50 MG tablet   traZODone (DESYREL) 50 MG tablet   Insomnia, unspecified type       Relevant Medications   traZODone (DESYREL) 50 MG tablet      I have discontinued Ms. Hemrick's diclofenac, neomycin-polymyxin-hydrocortisone, dapagliflozin propanediol, metFORMIN, losartan, and metFORMIN. I am also having her start on olmesartan. Additionally, I am having her maintain her aspirin, albuterol, albuterol, Lancets, glucose blood, ONETOUCH DELICA LANCETS FINE, saxagliptin HCl, pravastatin, JARDIANCE, cloNIDine, cyclobenzaprine, omeprazole, Potassium Chloride ER, traMADol, traZODone, and triamterene-hydrochlorothiazide.  Meds ordered this encounter  Medications  . olmesartan (BENICAR) 20 MG tablet    Sig: Take 1 tablet (20 mg total) by mouth daily.    Dispense:  30 tablet    Refill:  2  . cloNIDine (CATAPRES) 0.2 MG tablet    Sig: Take 1 tablet (0.2 mg total) by mouth 2 (two) times daily.    Dispense:  180 tablet    Refill:  1  . cyclobenzaprine (FLEXERIL) 5 MG tablet    Sig: Take 1 tablet (5 mg total) by mouth 3 (three) times daily as needed for muscle spasms.    Dispense:  30 tablet    Refill:  1  . omeprazole (PRILOSEC) 20 MG capsule    Sig: Take 1 capsule (20 mg total) by  mouth daily.    Dispense:  30 capsule    Refill:  3  . Potassium Chloride ER 20 MEQ TBCR    Sig: Take 40 mEq by mouth daily.    Dispense:  60 tablet    Refill:  2    This is a new dose based on labs please discontinue previous RX.  . traMADol (ULTRAM) 50 MG tablet    Sig: take 1 tablet by mouth every 6 hours if needed for pain    Dispense:  90 tablet    Refill:  0  . traZODone (DESYREL) 50 MG tablet    Sig: Take 0.5-1 tablets (25-50 mg total) by mouth at bedtime as needed for sleep.    Dispense:  30 tablet    Refill:  3  . triamterene-hydrochlorothiazide (MAXZIDE) 75-50 MG tablet    Sig: Take 1 tablet by mouth daily.    Dispense:  90 tablet    Refill:  0    CMA served as Education administrator  during this visit. History, Physical and Plan performed by medical provider. Documentation and orders reviewed and attested to.  [name], DO

## 2017-05-03 LAB — COMPREHENSIVE METABOLIC PANEL
ALT: 13 U/L (ref 0–35)
AST: 16 U/L (ref 0–37)
Albumin: 3.8 g/dL (ref 3.5–5.2)
Alkaline Phosphatase: 87 U/L (ref 39–117)
BUN: 14 mg/dL (ref 6–23)
CO2: 32 mEq/L (ref 19–32)
Calcium: 9.2 mg/dL (ref 8.4–10.5)
Chloride: 102 mEq/L (ref 96–112)
Creatinine, Ser: 0.88 mg/dL (ref 0.40–1.20)
GFR: 85.95 mL/min (ref >60.00)
Glucose, Bld: 134 mg/dL — ABNORMAL HIGH (ref 70–99)
Potassium: 3.6 mEq/L (ref 3.5–5.1)
Sodium: 140 mEq/L (ref 135–145)
Total Bilirubin: 0.5 mg/dL (ref 0.2–1.2)
Total Protein: 7.2 g/dL (ref 6.0–8.3)

## 2017-05-03 LAB — LIPID PANEL
Cholesterol: 150 mg/dL (ref 0–200)
HDL: 55.2 mg/dL (ref >39.00)
LDL Cholesterol: 66 mg/dL (ref 0–99)
NonHDL: 94.87
Total CHOL/HDL Ratio: 3
Triglycerides: 142 mg/dL (ref 0.0–149.0)
VLDL: 28.4 mg/dL (ref 0.0–40.0)

## 2017-05-03 LAB — HEMOGLOBIN A1C: Hgb A1c MFr Bld: 9.7 % — ABNORMAL HIGH (ref 4.6–6.5)

## 2017-05-03 NOTE — Assessment & Plan Note (Signed)
Well controlled, no changes to meds. Encouraged heart healthy diet such as the DASH diet and exercise as tolerated.  °

## 2017-05-03 NOTE — Assessment & Plan Note (Signed)
hgba1c to be done minimize simple carbs. Increase exercise as tolerated. Continue current meds  

## 2017-05-11 ENCOUNTER — Other Ambulatory Visit: Payer: Self-pay | Admitting: Family Medicine

## 2017-05-11 DIAGNOSIS — E785 Hyperlipidemia, unspecified: Secondary | ICD-10-CM

## 2017-05-11 DIAGNOSIS — E119 Type 2 diabetes mellitus without complications: Secondary | ICD-10-CM

## 2017-05-17 ENCOUNTER — Telehealth: Payer: Self-pay | Admitting: Family Medicine

## 2017-05-17 NOTE — Telephone Encounter (Signed)
Pt called in for lab results  °

## 2017-05-17 NOTE — Telephone Encounter (Signed)
Patient notified

## 2017-06-09 ENCOUNTER — Other Ambulatory Visit: Payer: Self-pay | Admitting: Family Medicine

## 2017-06-09 DIAGNOSIS — G4700 Insomnia, unspecified: Secondary | ICD-10-CM

## 2017-06-29 ENCOUNTER — Other Ambulatory Visit: Payer: Self-pay | Admitting: Family Medicine

## 2017-06-29 NOTE — Telephone Encounter (Signed)
Faxed 30d+3/appt in Jan/thx dmf

## 2017-08-04 ENCOUNTER — Telehealth: Payer: Self-pay | Admitting: Family Medicine

## 2017-08-04 DIAGNOSIS — M25511 Pain in right shoulder: Principal | ICD-10-CM

## 2017-08-04 DIAGNOSIS — G8929 Other chronic pain: Secondary | ICD-10-CM

## 2017-08-04 NOTE — Telephone Encounter (Signed)
Relation to MP:NTIR Call back [phone] Pharmacy:  Reason for call:  Patient requesting muscle spasm  please send to  [facility] [place], [place] [phone] (Phone) [phone] (Fax)

## 2017-08-09 MED ORDER — CYCLOBENZAPRINE HCL 5 MG PO TABS: 5.0000 mg | ORAL_TABLET | Freq: Three times a day (TID) | ORAL | 1 refills | Status: DC | PRN

## 2017-08-20 ENCOUNTER — Emergency Department (HOSPITAL_COMMUNITY): Payer: Medicare Other

## 2017-08-20 ENCOUNTER — Encounter (HOSPITAL_COMMUNITY): Payer: Self-pay | Admitting: Emergency Medicine

## 2017-08-20 ENCOUNTER — Inpatient Hospital Stay (HOSPITAL_COMMUNITY)
Admission: EM | Admit: 2017-08-20 | Discharge: 2017-08-28 | DRG: 871 | Disposition: A | Discharge status: Skilled Nursing Facility | Payer: Medicare Other | Attending: Internal Medicine | Admitting: Internal Medicine

## 2017-08-20 ENCOUNTER — Encounter (HOSPITAL_COMMUNITY): Payer: Self-pay

## 2017-08-20 ENCOUNTER — Emergency Department (HOSPITAL_COMMUNITY)
Admission: EM | Admit: 2017-08-20 | Discharge: 2017-08-20 | Disposition: A | Discharge status: Home / Self Care | Payer: Medicare Other | Source: Home / Self Care | Attending: Physician Assistant | Admitting: Physician Assistant

## 2017-08-20 DIAGNOSIS — E118 Type 2 diabetes mellitus with unspecified complications: Secondary | ICD-10-CM | POA: Diagnosis not present

## 2017-08-20 DIAGNOSIS — R000 Tachycardia, unspecified: Secondary | ICD-10-CM | POA: Diagnosis not present

## 2017-08-20 DIAGNOSIS — E669 Obesity, unspecified: Secondary | ICD-10-CM | POA: Diagnosis present

## 2017-08-20 DIAGNOSIS — M5489 Other dorsalgia: Secondary | ICD-10-CM | POA: Diagnosis not present

## 2017-08-20 DIAGNOSIS — Z79899 Other long term (current) drug therapy: Secondary | ICD-10-CM

## 2017-08-20 DIAGNOSIS — M25511 Pain in right shoulder: Secondary | ICD-10-CM | POA: Diagnosis not present

## 2017-08-20 DIAGNOSIS — M25551 Pain in right hip: Secondary | ICD-10-CM

## 2017-08-20 DIAGNOSIS — G8929 Other chronic pain: Secondary | ICD-10-CM | POA: Diagnosis present

## 2017-08-20 DIAGNOSIS — E1151 Type 2 diabetes mellitus with diabetic peripheral angiopathy without gangrene: Secondary | ICD-10-CM | POA: Diagnosis present

## 2017-08-20 DIAGNOSIS — Z6836 Body mass index (BMI) 36.0-36.9, adult: Secondary | ICD-10-CM

## 2017-08-20 DIAGNOSIS — K760 Fatty (change of) liver, not elsewhere classified: Secondary | ICD-10-CM | POA: Diagnosis not present

## 2017-08-20 DIAGNOSIS — M25451 Effusion, right hip: Secondary | ICD-10-CM | POA: Diagnosis not present

## 2017-08-20 DIAGNOSIS — J45909 Unspecified asthma, uncomplicated: Secondary | ICD-10-CM | POA: Diagnosis present

## 2017-08-20 DIAGNOSIS — Z885 Allergy status to narcotic agent status: Secondary | ICD-10-CM | POA: Diagnosis not present

## 2017-08-20 DIAGNOSIS — K5900 Constipation, unspecified: Secondary | ICD-10-CM | POA: Diagnosis not present

## 2017-08-20 DIAGNOSIS — M1990 Unspecified osteoarthritis, unspecified site: Secondary | ICD-10-CM | POA: Diagnosis not present

## 2017-08-20 DIAGNOSIS — E876 Hypokalemia: Secondary | ICD-10-CM | POA: Diagnosis present

## 2017-08-20 DIAGNOSIS — Z7984 Long term (current) use of oral hypoglycemic drugs: Secondary | ICD-10-CM

## 2017-08-20 DIAGNOSIS — A419 Sepsis, unspecified organism: Secondary | ICD-10-CM | POA: Diagnosis not present

## 2017-08-20 DIAGNOSIS — M545 Low back pain: Secondary | ICD-10-CM | POA: Diagnosis not present

## 2017-08-20 DIAGNOSIS — R0602 Shortness of breath: Secondary | ICD-10-CM | POA: Diagnosis not present

## 2017-08-20 DIAGNOSIS — Z888 Allergy status to other drugs, medicaments and biological substances status: Secondary | ICD-10-CM | POA: Diagnosis not present

## 2017-08-20 DIAGNOSIS — F329 Major depressive disorder, single episode, unspecified: Secondary | ICD-10-CM | POA: Diagnosis present

## 2017-08-20 DIAGNOSIS — A4101 Sepsis due to Methicillin susceptible Staphylococcus aureus: Principal | ICD-10-CM | POA: Diagnosis present

## 2017-08-20 DIAGNOSIS — E119 Type 2 diabetes mellitus without complications: Secondary | ICD-10-CM | POA: Insufficient documentation

## 2017-08-20 DIAGNOSIS — J9601 Acute respiratory failure with hypoxia: Secondary | ICD-10-CM | POA: Diagnosis not present

## 2017-08-20 DIAGNOSIS — Z7982 Long term (current) use of aspirin: Secondary | ICD-10-CM | POA: Insufficient documentation

## 2017-08-20 DIAGNOSIS — I10 Essential (primary) hypertension: Secondary | ICD-10-CM | POA: Diagnosis present

## 2017-08-20 DIAGNOSIS — R7881 Bacteremia: Secondary | ICD-10-CM | POA: Diagnosis not present

## 2017-08-20 DIAGNOSIS — Z794 Long term (current) use of insulin: Secondary | ICD-10-CM | POA: Diagnosis not present

## 2017-08-20 DIAGNOSIS — IMO0002 Reserved for concepts with insufficient information to code with codable children: Secondary | ICD-10-CM | POA: Diagnosis present

## 2017-08-20 DIAGNOSIS — E1165 Type 2 diabetes mellitus with hyperglycemia: Secondary | ICD-10-CM | POA: Diagnosis present

## 2017-08-20 DIAGNOSIS — R9431 Abnormal electrocardiogram [ECG] [EKG]: Secondary | ICD-10-CM | POA: Diagnosis not present

## 2017-08-20 LAB — COMPREHENSIVE METABOLIC PANEL
ALT: 13 U/L — ABNORMAL LOW (ref 14–54)
AST: 17 U/L (ref 15–41)
Albumin: 3.5 g/dL (ref 3.5–5.0)
Alkaline Phosphatase: 77 U/L (ref 38–126)
Anion gap: 10 (ref 5–15)
BUN: 12 mg/dL (ref 6–20)
CO2: 27 mmol/L (ref 22–32)
Calcium: 8.7 mg/dL — ABNORMAL LOW (ref 8.9–10.3)
Chloride: 100 mmol/L — ABNORMAL LOW (ref 101–111)
Creatinine, Ser: 0.98 mg/dL (ref 0.44–1.00)
GFR calc Af Amer: >60 mL/min (ref >60)
GFR calc non Af Amer: >60 mL/min (ref >60)
Glucose, Bld: 212 mg/dL — ABNORMAL HIGH (ref 65–99)
Potassium: 2.8 mmol/L — ABNORMAL LOW (ref 3.5–5.1)
Sodium: 137 mmol/L (ref 135–145)
Total Bilirubin: 0.8 mg/dL (ref 0.3–1.2)
Total Protein: 7.3 g/dL (ref 6.5–8.1)

## 2017-08-20 LAB — CBC WITH DIFFERENTIAL/PLATELET
Basophils Absolute: 0 10*3/uL (ref 0.0–0.1)
Basophils Relative: 0 %
Eosinophils Absolute: 0 10*3/uL (ref 0.0–0.7)
Eosinophils Relative: 0 %
HCT: 37.7 % (ref 36.0–46.0)
Hemoglobin: 12.7 g/dL (ref 12.0–15.0)
Lymphocytes Relative: 5 %
Lymphs Abs: 0.5 10*3/uL — ABNORMAL LOW (ref 0.7–4.0)
MCH: 28 pg (ref 26.0–34.0)
MCHC: 33.7 g/dL (ref 30.0–36.0)
MCV: 83.2 fL (ref 78.0–100.0)
Monocytes Absolute: 0.8 10*3/uL (ref 0.1–1.0)
Monocytes Relative: 7 %
Neutro Abs: 10.1 10*3/uL — ABNORMAL HIGH (ref 1.7–7.7)
Neutrophils Relative %: 88 %
Platelets: 155 10*3/uL (ref 150–400)
RBC: 4.53 MIL/uL (ref 3.87–5.11)
RDW: 14.7 % (ref 11.5–15.5)
WBC: 11.4 10*3/uL — ABNORMAL HIGH (ref 4.0–10.5)

## 2017-08-20 LAB — I-STAT CG4 LACTIC ACID, ED
Lactic Acid, Venous: 1.7 mmol/L (ref 0.5–1.9)
Lactic Acid, Venous: 2.39 mmol/L (ref 0.5–1.9)

## 2017-08-20 LAB — URINALYSIS, ROUTINE W REFLEX MICROSCOPIC
Bilirubin Urine: NEGATIVE
Glucose, UA: >=500 mg/dL — AB
Hgb urine dipstick: NEGATIVE
Ketones, ur: NEGATIVE mg/dL
Leukocytes, UA: NEGATIVE
Nitrite: NEGATIVE
Protein, ur: NEGATIVE mg/dL
Specific Gravity, Urine: 1.01 (ref 1.005–1.030)
pH: 6 (ref 5.0–8.0)

## 2017-08-20 LAB — URINALYSIS, MICROSCOPIC (REFLEX): Bacteria, UA: NONE SEEN

## 2017-08-20 LAB — LIPASE, BLOOD: Lipase: 24 U/L (ref 11–51)

## 2017-08-20 MED ORDER — ACETAMINOPHEN 325 MG PO TABS
650.0000 mg | ORAL_TABLET | Freq: Once | ORAL | Status: AC
  Administered 2017-08-20: 650 mg via ORAL
  Filled 2017-08-20: qty 2

## 2017-08-20 MED ORDER — IOPAMIDOL (ISOVUE-300) INJECTION 61%
INTRAVENOUS | Status: AC
  Administered 2017-08-21: 100 mL
  Filled 2017-08-20: qty 100

## 2017-08-20 MED ORDER — PIPERACILLIN-TAZOBACTAM 3.375 G IVPB
3.3750 g | Freq: Three times a day (TID) | INTRAVENOUS | Status: DC
  Administered 2017-08-20 – 2017-08-21 (×2): 3.375 g via INTRAVENOUS
  Filled 2017-08-20 (×4): qty 50

## 2017-08-20 MED ORDER — GADOBENATE DIMEGLUMINE 529 MG/ML IV SOLN
19.0000 mL | Freq: Once | INTRAVENOUS | Status: AC | PRN
  Administered 2017-08-20: 20 mL via INTRAVENOUS

## 2017-08-20 MED ORDER — POTASSIUM CHLORIDE 10 MEQ/100ML IV SOLN
10.0000 meq | INTRAVENOUS | Status: AC
  Administered 2017-08-21 (×2): 10 meq via INTRAVENOUS
  Filled 2017-08-20 (×2): qty 100

## 2017-08-20 MED ORDER — SODIUM CHLORIDE 0.9 % IV SOLN
1000.0000 mL | INTRAVENOUS | Status: DC
  Administered 2017-08-20: 1000 mL via INTRAVENOUS

## 2017-08-20 MED ORDER — VANCOMYCIN HCL IN DEXTROSE 1-5 GM/200ML-% IV SOLN
1000.0000 mg | Freq: Two times a day (BID) | INTRAVENOUS | Status: DC
  Administered 2017-08-21: 1000 mg via INTRAVENOUS
  Filled 2017-08-20 (×2): qty 200

## 2017-08-20 MED ORDER — SODIUM CHLORIDE 0.9 % IV BOLUS (SEPSIS)
1000.0000 mL | Freq: Once | INTRAVENOUS | Status: AC
  Administered 2017-08-20: 1000 mL via INTRAVENOUS

## 2017-08-20 MED ORDER — PIPERACILLIN-TAZOBACTAM 3.375 G IVPB 30 MIN
3.3750 g | Freq: Once | INTRAVENOUS | Status: AC
  Administered 2017-08-20: 3.375 g via INTRAVENOUS
  Filled 2017-08-20: qty 50

## 2017-08-20 MED ORDER — HYDROMORPHONE HCL 1 MG/ML IJ SOLN
0.5000 mg | INTRAMUSCULAR | Status: DC | PRN
  Administered 2017-08-20 – 2017-08-21 (×5): 0.5 mg via INTRAVENOUS
  Filled 2017-08-20 (×5): qty 1

## 2017-08-20 MED ORDER — ACETAMINOPHEN 500 MG PO TABS
1000.0000 mg | ORAL_TABLET | Freq: Once | ORAL | Status: AC
  Administered 2017-08-20: 1000 mg via ORAL
  Filled 2017-08-20: qty 2

## 2017-08-20 MED ORDER — OXYCODONE-ACETAMINOPHEN 5-325 MG PO TABS
1.0000 | ORAL_TABLET | Freq: Once | ORAL | Status: AC
  Administered 2017-08-20: 1 via ORAL
  Filled 2017-08-20: qty 1

## 2017-08-20 MED ORDER — IBUPROFEN 400 MG PO TABS
400.0000 mg | ORAL_TABLET | Freq: Once | ORAL | Status: AC
  Administered 2017-08-20: 400 mg via ORAL
  Filled 2017-08-20: qty 1

## 2017-08-20 MED ORDER — VANCOMYCIN HCL IN DEXTROSE 1-5 GM/200ML-% IV SOLN
1000.0000 mg | Freq: Once | INTRAVENOUS | Status: AC
  Administered 2017-08-20: 1000 mg via INTRAVENOUS
  Filled 2017-08-20: qty 200

## 2017-08-20 NOTE — ED Triage Notes (Signed)
Pt from home via EMS for back pain that began last night. Pt seen in ED today and d/c after administration of pain medication. Pt reports no relief, per EMS on arrival pt screaming and yelling due to severe pain. Pt wheelchair bound but was reportedly standing for a long time yesterday cooking. A&Ox4. 18 G L AC. Pt given 149mcg fentanyl en route and 4 mg zofran. Pt febrile 101.1, hot to the touch. EMS 120 HR, 160/110, 93% on RA after fentanyl administration. Pt on 2L [place].

## 2017-08-20 NOTE — Progress Notes (Signed)
Pharmacy Antibiotic Note  Heather Walker is a 56 y.o. female admitted on 08/20/2017 with sepsis.    Plan: Zosyn 3.375 gm iv q8h Vanc 1 g q12h Monitor renal fx cx vt prn  Height: 5\' 3"  (160 cm) Weight: 198 lb (89.8 kg) IBW/kg (Calculated) : 52.4  Temp (24hrs), Avg:99.9 F (37.7 C), Min:98.9 F (37.2 C), Max:101.3 F (38.5 C)  Recent Labs  Lab 08/20/17 1640 08/20/17 1659  WBC 11.4*  --   CREATININE 0.98  --   LATICACIDVEN  --  1.70    Estimated Creatinine Clearance: 69.8 mL/min (by C-G formula based on SCr of 0.98 mg/dL).    Allergies  Allergen Reactions  . Atorvastatin Other (See Comments)    Neck stiffness  . Codeine Itching   Levester Fresh, PharmD, BCPS, BCCCP Clinical Pharmacist Clinical phone for 08/20/2017 from 7a-3:30p: [phone] If after 3:30p, please call main pharmacy at: [phone] 08/20/2017 5:53 PM

## 2017-08-20 NOTE — ED Notes (Signed)
ED Provider at bedside. 

## 2017-08-20 NOTE — ED Notes (Signed)
Abx hung after second culture drawn at approx 1705.

## 2017-08-20 NOTE — ED Notes (Signed)
Pt arrives to room in tears c/ o of right hip pain, denies trauma. Able to ambulate from wheelchair to bed. Pt has equal pedal pulses no deformity noted. No redness or swelling.

## 2017-08-20 NOTE — ED Notes (Signed)
Unable to obtain repeat lactic at this time, pt still in MRI. Will draw lab upon pt return to room.

## 2017-08-20 NOTE — ED Notes (Signed)
Pt transported to MRI 

## 2017-08-20 NOTE — ED Notes (Signed)
Pt still in MRI 

## 2017-08-20 NOTE — Discharge Instructions (Signed)
Please read and follow all provided instructions.  Your diagnoses today include:  1. Right hip pain     Tests performed today include: Vital signs. See below for your results today.   Medications prescribed:  Take as prescribed   Home care instructions:  Follow any educational materials contained in this packet.  Follow-up instructions: Please follow-up with your primary care provider for further evaluation of symptoms and treatment   Return instructions:  Please return to the Emergency Department if you do not get better, if you get worse, or new symptoms OR  - Fever (temperature greater than 101.57F)  - Bleeding that does not stop with holding pressure to the area    -Severe pain (please note that you may be more sore the day after your accident)  - Chest Pain  - Difficulty breathing  - Severe nausea or vomiting  - Inability to tolerate food and liquids  - Passing out  - Skin becoming red around your wounds  - Change in mental status (confusion or lethargy)  - New numbness or weakness    Please return if you have any other emergent concerns.  Additional Information:  Your vital signs today were: BP (!) 143/130 (BP Location: Left Arm)    Pulse (!) 112    Temp 98.9 F (37.2 C) (Oral)    Resp 14    Ht 5\' 4"  (1.626 m)    Wt 85.7 kg (189 lb)    SpO2 100%    BMI 32.44 kg/m  If your blood pressure (BP) was elevated above 135/85 this visit, please have this repeated by your doctor within one month. ---------------

## 2017-08-20 NOTE — ED Notes (Signed)
Pt helped into wheelchair and carried to husbands car.

## 2017-08-20 NOTE — ED Provider Notes (Signed)
[facility] EMERGENCY DEPARTMENT Provider Note   CSN: [id number] Arrival date & time: 08/20/17  1609     History   Chief Complaint Chief Complaint  Patient presents with  . Back Pain  . Fever    HPI Heather Walker is a 56 y.o. female.  HPI Heather Walker is a 56 y.o. female with history of asthma, arthritis, diabetes, hypertension, presents to emergency department complaining of lower back and right hip pain.  Patient states her symptoms began about 2-3 days ago.  States pain is in the lower back, as well as right hip, worsened with any movement or palpation or walking.  She reports history of back problems and hip problems, attributed to arthritis, states she sometimes uses wheelchair and sometimes walks with a cane.  She states she takes muscle relaxants and ibuprofen for her pain at home.  She states that on last several days her pain has gotten significantly worse.  She was seen in emergency department for the same this morning, was treated with pain medications, discharged home with follow-up.  She states pain did not improve.  Per EMS, patient was screaming in pain upon their arrival.  Fentanyl given for pain relief.  Patient was found to be febrile and tachycardic.  Patient is unsure when her fever began.  Denies any injuries.  Denies any IV drug use.  She does report some tingling and weakness in the right leg.  She is not sure if it is new.  Past Medical History:  Diagnosis Date  . Arthritis   . Asthma   . Depression   . Diabetes mellitus without complication ([place])   . Heart murmur   . Hypertension     Patient Active Problem List   Diagnosis Date Noted  . Preventative health care 11/20/2016  . Right ankle pain 05/09/2016  . Palpitations 12/08/2014  . Diabetes mellitus type II, uncontrolled ([place]) 08/03/2014  . Leg wound, right 08/03/2014  . Hyperglycemia 07/13/2014  . Chest pain 07/12/2014  . Diabetes mellitus ([place]) 07/12/2014  . Acute kidney  injury ([place]) 07/12/2014  . Abdominal pain, right upper quadrant 05/28/2014  . Tachycardia 05/03/2013  . Abscess 11/13/2012  . Breast pain, right 08/10/2012  . OA (osteoarthritis) 03/04/2011  . DEPRESSION, MAJOR, RECURRENT, SEVERE 12/22/2009  . BACK PAIN, LUMBAR, WITH RADICULOPATHY 09/17/2009  . CALF PAIN, RIGHT 09/17/2009  . RENAL CYST 08/28/2009  . LOW BACK PAIN, ACUTE 11/13/2008  . FIBROIDS, UTERUS 09/10/2007  . UTI 09/03/2007  . BACTERIAL VAGINITIS 09/03/2007  . PELVIC PAIN, RIGHT 09/03/2007  . HYPOKALEMIA, HX OF 09/03/2007  . MOLE 06/21/2007  . ALLERGIC RHINITIS 06/21/2007  . HEMOCCULT POSITIVE STOOL 06/21/2007  . SINUSITIS, ACUTE NOS 04/05/2007  . REFLUX, ESOPHAGEAL 04/05/2007  . CHEST PAIN 04/05/2007  . Essential hypertension 03/19/2007  . ALLERGIC RHINITIS, SEASONAL 03/09/2007  . ASTHMA 03/09/2007    Past Surgical History:  Procedure Laterality Date  . ABDOMINAL HYSTERECTOMY    . APPENDECTOMY    . BLADDER SUSPENSION    . CESAREAN SECTION     3 previous  . TUBAL LIGATION      OB History    Gravida Para Term Preterm AB Living   3 3 3     3    SAB TAB Ectopic Multiple Live Births                   Home Medications    Prior to Admission medications   Medication Sig Start Date  End Date Taking? Authorizing Provider  albuterol (PROVENTIL HFA;VENTOLIN HFA) 108 (90 BASE) MCG/ACT inhaler Inhale 2 puffs into the lungs every 6 (six) hours as needed. For shortness of breath. 02/27/15   Roma Schanz R, DO  albuterol (PROVENTIL) (2.5 MG/3ML) 0.083% nebulizer solution Take 3 mLs (2.5 mg total) by nebulization every 6 (six) hours as needed for wheezing or shortness of breath. 10/16/14   [name], DO  aspirin EC 81 MG EC tablet Take 1 tablet (81 mg total) by mouth daily. 07/15/14   Eugenie Filler, MD  cloNIDine (CATAPRES) 0.2 MG tablet Take 1 tablet (0.2 mg total) by mouth 2 (two) times daily. 05/02/17   [name], DO  cyclobenzaprine  (FLEXERIL) 5 MG tablet Take 1 tablet (5 mg total) by mouth 3 (three) times daily as needed for muscle spasms. 08/09/17   [name], DO  empagliflozin (JARDIANCE) 10 MG TABS tablet Take 10 mg by mouth daily. 06/12/17   Roma Schanz R, DO  glucose blood (ONETOUCH VERIO) test strip Check blood sugar twice daily. Dx: E11.9 05/09/16   Roma Schanz R, DO  Lancets MISC Check Blood sugar once a day. 03/08/16   [name], DO  olmesartan (BENICAR) 20 MG tablet Take 1 tablet (20 mg total) by mouth daily. 05/02/17   [name], DO  omeprazole (PRILOSEC) 20 MG capsule Take 1 capsule (20 mg total) by mouth daily. 05/02/17   Carollee Herter, Alferd Apa, DO  ONETOUCH DELICA LANCETS FINE MISC Check blood sugar once daily 05/10/16   Carollee Herter, Alferd Apa, DO  Potassium Chloride ER 20 MEQ TBCR Take 40 mEq by mouth daily. 05/02/17   [name], DO  pravastatin (PRAVACHOL) 20 MG tablet take 1 tablet by mouth once daily 06/29/17   Carollee Herter, Alferd Apa, DO  saxagliptin HCl (ONGLYZA) 5 MG TABS tablet Take 1 tablet (5 mg total) by mouth daily. 11/20/16   [name], DO  traMADol (ULTRAM) 50 MG tablet take 1 tablet by mouth every 6 hours if needed for pain 05/02/17   Carollee Herter, Alferd Apa, DO  traZODone (DESYREL) 50 MG tablet Take 0.5-1 tablets (25-50 mg total) by mouth at bedtime as needed for sleep. 06/12/17   [name], DO  triamterene-hydrochlorothiazide (MAXZIDE) 75-50 MG tablet Take 1 tablet by mouth daily. 05/02/17   [name], DO    Family History Family History  Problem Relation Age of Onset  . Hypertension Mother   . Asthma Mother   . Hypertension Sister   . Asthma Sister   . Hypertension Maternal Grandmother   . Heart defect Sister   . Asthma Sister   . Aneurysm Sister   . Asthma Sister     Social History Social History   Tobacco Use  . Smoking status: Never Smoker  . Smokeless tobacco: Never Used  Substance Use Topics    . Alcohol use: No  . Drug use: No     Allergies   Atorvastatin and Codeine   Review of Systems Review of Systems  Constitutional: Negative for chills and fever.  Respiratory: Negative for cough, chest tightness and shortness of breath.   Cardiovascular: Negative for chest pain, palpitations and leg swelling.  Gastrointestinal: Negative for abdominal pain, diarrhea, nausea and vomiting.  Genitourinary: Negative for dysuria, flank pain and pelvic pain.  Musculoskeletal: Positive for arthralgias, back pain, gait problem and myalgias. Negative  for neck pain and neck stiffness.  Skin: Negative for rash.  Neurological: Positive for weakness and numbness. Negative for dizziness and headaches.  All other systems reviewed and are negative.    Physical Exam Updated Vital Signs BP (!) 159/102 (BP Location: Left Arm)   Pulse (!) 120   Temp 100.1 F (37.8 C) (Oral)   Resp 19   Ht 5\' 3"  (1.6 m)   Wt 89.8 kg (198 lb)   SpO2 94%   BMI 35.07 kg/m   Physical Exam  Constitutional: She is oriented to person, place, and time. She appears well-developed and well-nourished. No distress.  HENT:  Head: Normocephalic.  Eyes: Conjunctivae are normal.  Neck: Neck supple.  Cardiovascular: Normal rate, regular rhythm and normal heart sounds.  Pulmonary/Chest: Effort normal and breath sounds normal. No respiratory distress. She has no wheezes. She has no rales.  Abdominal: Soft. Bowel sounds are normal. She exhibits no distension. There is no tenderness. There is no rebound and no guarding.  No CVA tenderness bilaterally  Musculoskeletal: She exhibits no edema.  Midline lumbar spine tenderness. ttp over lateral right hip. Pain with flexion of the hip. No pain with internal or external hip movement. Unable to raise right leg actively off stretcher.   Neurological: She is alert and oriented to person, place, and time.  5/5 and equal lower extremity strength. 2+ and equal patellar reflexes  bilaterally. Pt able to dorsiflex bilateral toes and feet with good strength against resistance. Equal sensation bilaterally over thighs and lower legs.   Skin: Skin is warm and dry.  Psychiatric: She has a normal mood and affect. Her behavior is normal.  Nursing note and vitals reviewed.    ED Treatments / Results  Labs (all labs ordered are listed, but only abnormal results are displayed) Labs Reviewed  COMPREHENSIVE METABOLIC PANEL - Abnormal; Notable for the following components:      Result Value   Potassium 2.8 (*)    Chloride 100 (*)    Glucose, Bld 212 (*)    Calcium 8.7 (*)    ALT 13 (*)    All other components within normal limits  CBC WITH DIFFERENTIAL/PLATELET - Abnormal; Notable for the following components:   WBC 11.4 (*)    Neutro Abs 10.1 (*)    Lymphs Abs 0.5 (*)    All other components within normal limits  URINALYSIS, ROUTINE W REFLEX MICROSCOPIC - Abnormal; Notable for the following components:   Glucose, UA >=500 (*)    All other components within normal limits  URINALYSIS, MICROSCOPIC (REFLEX) - Abnormal; Notable for the following components:   Squamous Epithelial / LPF 0-5 (*)    All other components within normal limits  I-STAT CG4 LACTIC ACID, ED - Abnormal; Notable for the following components:   Lactic Acid, Venous 2.39 (*)    All other components within normal limits  URINE CULTURE  CULTURE, BLOOD (ROUTINE X 2)  CULTURE, BLOOD (ROUTINE X 2)  LIPASE, BLOOD  I-STAT CG4 LACTIC ACID, ED    EKG  EKG Interpretation  Date/Time:  Sunday August 20 2017 16:47:12 EST Ventricular Rate:  112 PR Interval:    QRS Duration: 82 QT Interval:  339 QTC Calculation: 463 R Axis:   -59 Text Interpretation:  Sinus tachycardia Abnormal R-wave progression, late transition Left ventricular hypertrophy Inferior infarct, old changes noted since June 2018 Confirmed by Sherwood Gambler [phone]) on 08/20/2017 5:38:13 PM       Radiology Mr Lumbar Spine W Lottie Dawson  Contrast  Result Date: 08/20/2017 CLINICAL DATA:  Severe RIGHT hip pain radiating to back for 2 days. Back pain beginning last night. Sepsis. History of diabetes. EXAM: MRI LUMBAR SPINE WITHOUT AND WITH CONTRAST TECHNIQUE: Multiplanar and multiecho pulse sequences of the lumbar spine were obtained without and with intravenous contrast. CONTRAST:  74mL MULTIHANCE GADOBENATE DIMEGLUMINE 529 MG/ML IV SOLN COMPARISON:  None. FINDINGS: SEGMENTATION: For the purposes of this report, the last well-formed intervertebral disc will be reported as L5-S1. Transitional anatomy with small S1-2 disc, lumbarized S1 vertebral body. ALIGNMENT: Maintained lumbar lordosis. No malalignment. VERTEBRAE:Vertebral bodies are intact. Intervertebral discs demonstrate normal morphology, mild desiccation L5-S1 disc. Multilevel mild acute on chronic discogenic endplate changes. No abnormal osseous or suspicious disc enhancement. CONUS MEDULLARIS: Conus medullaris terminates at L2-3 and demonstrates normal morphology and signal characteristics. 2 mm tubular T1 bright signal along the filum terminale with chemical shift artifact consistent with fibro lipomatosis changes. No abnormal cord, leptomeningeal enhancement. PARASPINAL AND SOFT TISSUES: Faint enhancement and bright interstitial STIR signal paraspinal muscles mid to lower lumbar spine. No focal fluid collection. DISC LEVELS: T12-L1 thru L2-3: No disc bulge, canal stenosis nor neural foraminal narrowing. L3-4: No disc bulge. Moderate RIGHT mild LEFT facet arthropathy without canal stenosis or neural foraminal narrowing. L4-5: Small RIGHT subarticular disc protrusion with faint enhancing annular fissure. Moderate RIGHT greater than LEFT facet arthropathy and ligamentum flavum redundancy. Trace RIGHT facet synovial cyst within dorsal epidural space. No canal stenosis. Mild RIGHT neural foraminal narrowing. L5-S1: Small LEFT subarticular disc protrusion. Central enhancing annular fissure.  Severe facet arthropathy. No canal stenosis. Moderate RIGHT, moderate to severe LEFT neural foraminal narrowing. IMPRESSION: 1. No MR findings of discitis osteomyelitis. Faintly enhancing paraspinal muscles seen with low-grade strain, less likely myositis. 2. Small RIGHT L4-5 disc protrusion with annular fissure may be acute. 3. Degenerative change of the lumbar spine. No canal stenosis. Neural foraminal narrowing L4-5 and L5-S1: Moderate to severe on the LEFT at L5-S1. 4. Low-lying spinal cord with fibro lipomatosis changes of filum terminale, no cord tethering. Electronically Signed   By: Elon Alas M.D.   On: 08/20/2017 22:43   Dg Chest Port 1 View  Result Date: 08/20/2017 CLINICAL DATA:  Nodes sepsis.  Fever.  Shortness of breath. EXAM: PORTABLE CHEST 1 VIEW COMPARISON:  None. FINDINGS: The heart size and mediastinal contours are within normal limits. Both lungs are clear. The visualized skeletal structures are unremarkable. IMPRESSION: No active disease. Electronically Signed   By: Dorise Bullion III M.D   On: 08/20/2017 17:28   Dg Hip Unilat W Or Wo Pelvis 2-3 Views Right  Result Date: 08/20/2017 CLINICAL DATA:  Right hip pain.  No known injury. EXAM: DG HIP (WITH OR WITHOUT PELVIS) 2-3V RIGHT COMPARISON:  None. FINDINGS: There is no evidence of hip fracture or dislocation. There is no evidence of arthropathy or other focal bone abnormality. IMPRESSION: Normal examination. Electronically Signed   By: Claudie Revering M.D.   On: 08/20/2017 09:18    Procedures Procedures (including critical care time)   CRITICAL CARE Performed by: Jeannett Senior A Total critical care time: 30 minutes Critical care time was exclusive of separately billable procedures and treating other patients. Critical care was necessary to treat or prevent imminent or life-threatening deterioration. Critical care was time spent personally by me on the following activities: development of treatment plan with patient  and/or surrogate as well as nursing, discussions with consultants, evaluation of patient's response to treatment, examination of patient, obtaining history  from patient or surrogate, ordering and performing treatments and interventions, ordering and review of laboratory studies, ordering and review of radiographic studies, pulse oximetry and re-evaluation of patient's condition.  Medications Ordered in ED Medications  0.9 %  sodium chloride infusion (1,000 mLs Intravenous Restarted 08/20/17 2230)  HYDROmorphone (DILAUDID) injection 0.5 mg (0.5 mg Intravenous Given 08/20/17 2256)  piperacillin-tazobactam (ZOSYN) IVPB 3.375 g (not administered)  vancomycin (VANCOCIN) IVPB 1000 mg/200 mL premix (not administered)  iopamidol (ISOVUE-300) 61 % injection (not administered)  sodium chloride 0.9 % bolus 1,000 mL (0 mLs Intravenous Stopped 08/20/17 1846)    And  sodium chloride 0.9 % bolus 1,000 mL (0 mLs Intravenous Stopped 08/20/17 1846)    And  sodium chloride 0.9 % bolus 1,000 mL (0 mLs Intravenous Stopped 08/20/17 1846)  piperacillin-tazobactam (ZOSYN) IVPB 3.375 g (0 g Intravenous Stopped 08/20/17 1735)  vancomycin (VANCOCIN) IVPB 1000 mg/200 mL premix (0 mg Intravenous Stopped 08/20/17 1805)  acetaminophen (TYLENOL) tablet 1,000 mg (1,000 mg Oral Given 08/20/17 1702)  gadobenate dimeglumine (MULTIHANCE) injection 19 mL (20 mLs Intravenous Contrast Given 08/20/17 2229)  acetaminophen (TYLENOL) tablet 650 mg (650 mg Oral Given 08/20/17 2255)     Initial Impression / Assessment and Plan / ED Course  I have reviewed the triage vital signs and the nursing notes.  Pertinent labs & imaging results that were available during my care of the patient were reviewed by me and considered in my medical decision making (see chart for details).     Patient with worsening pain to the right hip and lower back.  History of arthritis.  Patient with acutely worsening pain without any injury.  Found to be febrile and  tachycardic by EMS.  Patient has a fever 101.6, she is tachycardic into 120s.  Blood pressure is slightly elevated.  We will get labs, sepsis order set placed since patient does meet Sirs criteria.  Unknown source of infection at this time.  Hip was imaged this morning and was unremarkable.  Will get urinalysis, chest x-ray blood cultures.  IV fluids ordered.  Pain medications and Tylenol ordered. Abdomen bening.   Sepsis - Repeat Assessment  Performed at:    6:37 PM   Vitals     Blood pressure (!) 160/98, pulse (!) 117, temperature 99.1 F (37.3 C), temperature source Oral, resp. rate 14, height 5\' 3"  (1.6 m), weight 89.8 kg (198 lb), SpO2 100 %.  Heart:     Tachycardic  Lungs:    CTA  Capillary Refill:   <2 sec  Peripheral Pulse:   Radial pulse palpable  Skin:     Normal Color   6:37 PM Labs show hypokalemia and mild hyperglycemia. WBC elevated at 11.4, lactic normal. Pt received 3L of NS, antibiotics, tylenol. HR still in 120s. Continues to have severe back pain. Will get MR lumbar spine to ro infectious process.   11:18 PM MR lumbar spine negative. MR right hip will not be read till tomorrow. Lactic repeat is 2.39. Pt still tachycardic, additional tylenol ordered. BP remaining stable. Receiving fluids. Dr. Colin Mulders reassessed pt, now having some abdominal tenderness, CT ordered. Spoke with triad, will admit  Vitals:   08/20/17 2230 08/20/17 2231 08/20/17 2245 08/20/17 2300  BP: (!) 146/99 (!) 146/99 (!) 137/97 131/87  Pulse: (!) 127 (!) 124 (!) 124 (!) 123  Resp: (!) 24 18 19  (!) 24  Temp:  99.7 F (37.6 C)    TempSrc:  Oral    SpO2: 94% 96% 98% 99%  Weight:      Height:         Final Clinical Impressions(s) / ED Diagnoses   Final diagnoses:  Sepsis, due to unspecified organism [facility])  Right hip pain    New Prescriptions This SmartLink is deprecated. Use AVSMEDLIST instead to display the medication list for a patient.   Jeannett Senior, PA-C 08/20/17 2321     Jeannett Senior, PA-C 08/20/17 2322    Sherwood Gambler, MD 08/21/17 [phone]

## 2017-08-20 NOTE — ED Provider Notes (Signed)
[facility]ERGENCY DEPARTMENT Provider Note   CSN: [id number] Arrival date & time: 08/20/17  0350     History   Chief Complaint Chief Complaint  Patient presents with  . Hip Pain  . Arthritis    HPI Heather Walker is a 56 y.o. female.  HPI  56 y.o. female with a hx of DM, HTN, presents to the Emergency Department today due to hip pain. Notes this is on the right side. States pain has worsened the past 2 days. Notes hx of arthritis in the hip. States that she thinks she has been standing too long and that exacerbated the pain. Denies trauma to area. No numbness/tingling. Notes pain 10/10 and isolated to right outside of hip. No CP/SOB/ABD pain. No other symptoms noted   Past Medical History:  Diagnosis Date  . Arthritis   . Asthma   . Depression   . Diabetes mellitus without complication ([place])   . Heart murmur   . Hypertension     Patient Active Problem List   Diagnosis Date Noted  . Preventative health care 11/20/2016  . Right ankle pain 05/09/2016  . Palpitations 12/08/2014  . Diabetes mellitus type II, uncontrolled ([place]) 08/03/2014  . Leg wound, right 08/03/2014  . Hyperglycemia 07/13/2014  . Chest pain 07/12/2014  . Diabetes mellitus ([place]) 07/12/2014  . Acute kidney injury ([place]) 07/12/2014  . Abdominal pain, right upper quadrant 05/28/2014  . Tachycardia 05/03/2013  . Abscess 11/13/2012  . Breast pain, right 08/10/2012  . OA (osteoarthritis) 03/04/2011  . DEPRESSION, MAJOR, RECURRENT, SEVERE 12/22/2009  . BACK PAIN, LUMBAR, WITH RADICULOPATHY 09/17/2009  . CALF PAIN, RIGHT 09/17/2009  . RENAL CYST 08/28/2009  . LOW BACK PAIN, ACUTE 11/13/2008  . FIBROIDS, UTERUS 09/10/2007  . UTI 09/03/2007  . BACTERIAL VAGINITIS 09/03/2007  . PELVIC PAIN, RIGHT 09/03/2007  . HYPOKALEMIA, HX OF 09/03/2007  . MOLE 06/21/2007  . ALLERGIC RHINITIS 06/21/2007  . HEMOCCULT POSITIVE STOOL 06/21/2007  . SINUSITIS, ACUTE NOS 04/05/2007  . REFLUX,  ESOPHAGEAL 04/05/2007  . CHEST PAIN 04/05/2007  . Essential hypertension 03/19/2007  . ALLERGIC RHINITIS, SEASONAL 03/09/2007  . ASTHMA 03/09/2007    Past Surgical History:  Procedure Laterality Date  . ABDOMINAL HYSTERECTOMY    . APPENDECTOMY    . BLADDER SUSPENSION    . CESAREAN SECTION     3 previous  . TUBAL LIGATION      OB History    Gravida Para Term Preterm AB Living   3 3 3     3    SAB TAB Ectopic Multiple Live Births                   Home Medications    Prior to Admission medications   Medication Sig Start Date End Date Taking? Authorizing Provider  albuterol (PROVENTIL HFA;VENTOLIN HFA) 108 (90 BASE) MCG/ACT inhaler Inhale 2 puffs into the lungs every 6 (six) hours as needed. For shortness of breath. 02/27/15   Roma Schanz R, DO  albuterol (PROVENTIL) (2.5 MG/3ML) 0.083% nebulizer solution Take 3 mLs (2.5 mg total) by nebulization every 6 (six) hours as needed for wheezing or shortness of breath. 10/16/14   [name], DO  aspirin EC 81 MG EC tablet Take 1 tablet (81 mg total) by mouth daily. 07/15/14   Eugenie Filler, MD  cloNIDine (CATAPRES) 0.2 MG tablet Take 1 tablet (0.2 mg total) by mouth 2 (two) times daily. 05/02/17   Roma Schanz  R, DO  cyclobenzaprine (FLEXERIL) 5 MG tablet Take 1 tablet (5 mg total) by mouth 3 (three) times daily as needed for muscle spasms. 08/09/17   [name], DO  empagliflozin (JARDIANCE) 10 MG TABS tablet Take 10 mg by mouth daily. 06/12/17   Roma Schanz R, DO  glucose blood (ONETOUCH VERIO) test strip Check blood sugar twice daily. Dx: E11.9 05/09/16   Roma Schanz R, DO  Lancets MISC Check Blood sugar once a day. 03/08/16   [name], DO  olmesartan (BENICAR) 20 MG tablet Take 1 tablet (20 mg total) by mouth daily. 05/02/17   [name], DO  omeprazole (PRILOSEC) 20 MG capsule Take 1 capsule (20 mg total) by mouth daily. 05/02/17   Carollee Herter, Alferd Apa, DO    ONETOUCH DELICA LANCETS FINE MISC Check blood sugar once daily 05/10/16   Carollee Herter, Alferd Apa, DO  Potassium Chloride ER 20 MEQ TBCR Take 40 mEq by mouth daily. 05/02/17   [name], DO  pravastatin (PRAVACHOL) 20 MG tablet take 1 tablet by mouth once daily 06/29/17   Carollee Herter, Alferd Apa, DO  saxagliptin HCl (ONGLYZA) 5 MG TABS tablet Take 1 tablet (5 mg total) by mouth daily. 11/20/16   [name], DO  traMADol (ULTRAM) 50 MG tablet take 1 tablet by mouth every 6 hours if needed for pain 05/02/17   Carollee Herter, Alferd Apa, DO  traZODone (DESYREL) 50 MG tablet Take 0.5-1 tablets (25-50 mg total) by mouth at bedtime as needed for sleep. 06/12/17   [name], DO  triamterene-hydrochlorothiazide (MAXZIDE) 75-50 MG tablet Take 1 tablet by mouth daily. 05/02/17   [name], DO    Family History Family History  Problem Relation Age of Onset  . Hypertension Mother   . Asthma Mother   . Hypertension Sister   . Asthma Sister   . Hypertension Maternal Grandmother   . Heart defect Sister   . Asthma Sister   . Aneurysm Sister   . Asthma Sister     Social History Social History   Tobacco Use  . Smoking status: Never Smoker  . Smokeless tobacco: Never Used  Substance Use Topics  . Alcohol use: No  . Drug use: No     Allergies   Atorvastatin and Codeine   Review of Systems Review of Systems ROS reviewed and all are negative for acute change except as noted in the HPI.  Physical Exam Updated Vital Signs BP (!) 143/130 (BP Location: Left Arm)   Pulse (!) 112   Temp 98.9 F (37.2 C) (Oral)   Resp 14   Ht 5\' 4"  (1.626 m)   Wt 85.7 kg (189 lb)   SpO2 100%   BMI 32.44 kg/m   Physical Exam  Constitutional: She is oriented to person, place, and time. Vital signs are normal. She appears well-developed and well-nourished.  HENT:  Head: Normocephalic and atraumatic.  Right Ear: Hearing normal.  Left Ear: Hearing normal.  Eyes:  Conjunctivae and EOM are normal. Pupils are equal, round, and reactive to light.  Neck: Normal range of motion. Neck supple.  Cardiovascular: Normal rate, regular rhythm, normal heart sounds and intact distal pulses.  Pulmonary/Chest: Effort normal and breath sounds normal.  Musculoskeletal:  TTP right lateral aspect of hip. No palpable or visible deformities. Notes pain with internal/external rotation on lateral aspect. Able to raise leg against resistance. NVI. Distal pulses  appreciated.   Neurological: She is alert and oriented to person, place, and time.  Skin: Skin is warm and dry.  Psychiatric: She has a normal mood and affect. Her speech is normal and behavior is normal. Thought content normal.  Nursing note and vitals reviewed.    ED Treatments / Results  Labs (all labs ordered are listed, but only abnormal results are displayed) Labs Reviewed - No data to display  EKG  EKG Interpretation None       Radiology Dg Hip Unilat W Or Wo Pelvis 2-3 Views Right  Result Date: 08/20/2017 CLINICAL DATA:  Right hip pain.  No known injury. EXAM: DG HIP (WITH OR WITHOUT PELVIS) 2-3V RIGHT COMPARISON:  None. FINDINGS: There is no evidence of hip fracture or dislocation. There is no evidence of arthropathy or other focal bone abnormality. IMPRESSION: Normal examination. Electronically Signed   By: Claudie Revering M.D.   On: 08/20/2017 09:18    Procedures Procedures (including critical care time)  Medications Ordered in ED Medications - No data to display   Initial Impression / Assessment and Plan / ED Course  I have reviewed the triage vital signs and the nursing notes.  Pertinent labs & imaging results that were available during my care of the patient were reviewed by me and considered in my medical decision making (see chart for details).  Final Clinical Impressions(s) / ED Diagnoses   {I have reviewed and evaluated the relevant imaging studies. {I{I have reviewed the relevant  previous healthcare records.  {I obtained HPI from historian.   ED Course:  Assessment: Patient X-Ray negative for obvious fracture or dislocation.  Pt advised to follow up with orthopedics. Conservative therapy recommended and discussed. Patient will be discharged home & is agreeable with above plan. Returns precautions discussed. Pt appears safe for discharge  Disposition/Plan:  DC Home Additional Verbal discharge instructions given and discussed with patient.  Pt Instructed to f/u with Ortho in the next week for evaluation and treatment of symptoms. Return precautions given Pt acknowledges and agrees with plan  Supervising Physician Mackuen, Courteney Lyn, *  Final diagnoses:  Right hip pain    New Prescriptions This SmartLink is deprecated. Use AVSMEDLIST instead to display the medication list for a patient.   Shary Decamp, PA-C 08/20/17 1005    Mackuen, Fredia Sorrow, MD 08/22/17 2257

## 2017-08-20 NOTE — ED Notes (Signed)
Pt returned from MRI °

## 2017-08-20 NOTE — ED Notes (Signed)
EDP aware of pt lactic

## 2017-08-20 NOTE — ED Notes (Signed)
Pt family leaving to get something to eat while pt in scan. Will be back shortly.

## 2017-08-20 NOTE — ED Triage Notes (Signed)
Pt. Stated (crying) my hip is hurting so bad for 2 days , its my arthritis and I can't stand the pain. It goes up in my butt.

## 2017-08-21 ENCOUNTER — Emergency Department (HOSPITAL_COMMUNITY): Payer: Medicare Other

## 2017-08-21 ENCOUNTER — Other Ambulatory Visit: Payer: Self-pay

## 2017-08-21 DIAGNOSIS — F329 Major depressive disorder, single episode, unspecified: Secondary | ICD-10-CM | POA: Diagnosis present

## 2017-08-21 DIAGNOSIS — A419 Sepsis, unspecified organism: Secondary | ICD-10-CM | POA: Diagnosis not present

## 2017-08-21 DIAGNOSIS — Z6836 Body mass index (BMI) 36.0-36.9, adult: Secondary | ICD-10-CM | POA: Diagnosis not present

## 2017-08-21 DIAGNOSIS — E118 Type 2 diabetes mellitus with unspecified complications: Secondary | ICD-10-CM | POA: Diagnosis not present

## 2017-08-21 DIAGNOSIS — E1165 Type 2 diabetes mellitus with hyperglycemia: Secondary | ICD-10-CM | POA: Diagnosis not present

## 2017-08-21 DIAGNOSIS — E876 Hypokalemia: Secondary | ICD-10-CM | POA: Diagnosis present

## 2017-08-21 DIAGNOSIS — I10 Essential (primary) hypertension: Secondary | ICD-10-CM | POA: Diagnosis not present

## 2017-08-21 DIAGNOSIS — K5900 Constipation, unspecified: Secondary | ICD-10-CM | POA: Diagnosis present

## 2017-08-21 DIAGNOSIS — M25551 Pain in right hip: Secondary | ICD-10-CM | POA: Diagnosis not present

## 2017-08-21 DIAGNOSIS — E669 Obesity, unspecified: Secondary | ICD-10-CM | POA: Diagnosis present

## 2017-08-21 DIAGNOSIS — K760 Fatty (change of) liver, not elsewhere classified: Secondary | ICD-10-CM | POA: Diagnosis not present

## 2017-08-21 DIAGNOSIS — Z888 Allergy status to other drugs, medicaments and biological substances status: Secondary | ICD-10-CM | POA: Diagnosis not present

## 2017-08-21 DIAGNOSIS — Z794 Long term (current) use of insulin: Secondary | ICD-10-CM | POA: Diagnosis not present

## 2017-08-21 DIAGNOSIS — A4101 Sepsis due to Methicillin susceptible Staphylococcus aureus: Principal | ICD-10-CM

## 2017-08-21 DIAGNOSIS — G8929 Other chronic pain: Secondary | ICD-10-CM | POA: Diagnosis present

## 2017-08-21 DIAGNOSIS — J9601 Acute respiratory failure with hypoxia: Secondary | ICD-10-CM | POA: Diagnosis not present

## 2017-08-21 DIAGNOSIS — R269 Unspecified abnormalities of gait and mobility: Secondary | ICD-10-CM | POA: Diagnosis not present

## 2017-08-21 DIAGNOSIS — M6281 Muscle weakness (generalized): Secondary | ICD-10-CM | POA: Diagnosis not present

## 2017-08-21 DIAGNOSIS — R7881 Bacteremia: Secondary | ICD-10-CM | POA: Diagnosis not present

## 2017-08-21 DIAGNOSIS — R2689 Other abnormalities of gait and mobility: Secondary | ICD-10-CM | POA: Diagnosis not present

## 2017-08-21 DIAGNOSIS — J45909 Unspecified asthma, uncomplicated: Secondary | ICD-10-CM | POA: Diagnosis not present

## 2017-08-21 DIAGNOSIS — Z885 Allergy status to narcotic agent status: Secondary | ICD-10-CM | POA: Diagnosis not present

## 2017-08-21 DIAGNOSIS — F5101 Primary insomnia: Secondary | ICD-10-CM | POA: Diagnosis not present

## 2017-08-21 DIAGNOSIS — K219 Gastro-esophageal reflux disease without esophagitis: Secondary | ICD-10-CM | POA: Diagnosis not present

## 2017-08-21 DIAGNOSIS — M1990 Unspecified osteoarthritis, unspecified site: Secondary | ICD-10-CM | POA: Diagnosis present

## 2017-08-21 DIAGNOSIS — M545 Low back pain: Secondary | ICD-10-CM | POA: Diagnosis not present

## 2017-08-21 DIAGNOSIS — M25511 Pain in right shoulder: Secondary | ICD-10-CM | POA: Diagnosis not present

## 2017-08-21 DIAGNOSIS — Z79899 Other long term (current) drug therapy: Secondary | ICD-10-CM | POA: Diagnosis not present

## 2017-08-21 LAB — BLOOD CULTURE ID PANEL (REFLEXED)

## 2017-08-21 LAB — HIV ANTIBODY (ROUTINE TESTING W REFLEX): HIV Screen 4th Generation wRfx: NONREACTIVE

## 2017-08-21 LAB — URINE CULTURE: Culture: NO GROWTH

## 2017-08-21 LAB — GLUCOSE, CAPILLARY
Glucose-Capillary: 168 mg/dL — ABNORMAL HIGH (ref 65–99)
Glucose-Capillary: 159 mg/dL — ABNORMAL HIGH (ref 65–99)
Glucose-Capillary: 132 mg/dL — ABNORMAL HIGH (ref 65–99)
Glucose-Capillary: 109 mg/dL — ABNORMAL HIGH (ref 65–99)

## 2017-08-21 MED ORDER — TRAZODONE HCL 50 MG PO TABS: 25.0000 mg | ORAL_TABLET | Freq: Every evening | ORAL | Status: DC | PRN

## 2017-08-21 MED ORDER — POTASSIUM CHLORIDE CRYS ER 20 MEQ PO TBCR
40.0000 meq | EXTENDED_RELEASE_TABLET | Freq: Once | ORAL | Status: AC
  Administered 2017-08-21: 40 meq via ORAL
  Filled 2017-08-21: qty 2

## 2017-08-21 MED ORDER — ALBUTEROL SULFATE (2.5 MG/3ML) 0.083% IN NEBU
2.5000 mg | INHALATION_SOLUTION | RESPIRATORY_TRACT | Status: DC | PRN
  Administered 2017-08-23 (×3): 2.5 mg via RESPIRATORY_TRACT
  Filled 2017-08-21 (×3): qty 3

## 2017-08-21 MED ORDER — CEFAZOLIN SODIUM-DEXTROSE 2-4 GM/100ML-% IV SOLN
2.0000 g | Freq: Three times a day (TID) | INTRAVENOUS | Status: DC
  Filled 2017-08-21 (×2): qty 100

## 2017-08-21 MED ORDER — CYCLOBENZAPRINE HCL 5 MG PO TABS
5.0000 mg | ORAL_TABLET | Freq: Three times a day (TID) | ORAL | Status: DC | PRN
  Administered 2017-08-23: 5 mg via ORAL
  Filled 2017-08-21: qty 1

## 2017-08-21 MED ORDER — ASPIRIN EC 81 MG PO TBEC: 81.0000 mg | DELAYED_RELEASE_TABLET | Freq: Every day | ORAL | Status: DC

## 2017-08-21 MED ORDER — HYDROMORPHONE HCL 1 MG/ML IJ SOLN
0.5000 mg | INTRAMUSCULAR | Status: DC | PRN
  Administered 2017-08-21 – 2017-08-22 (×4): 0.5 mg via INTRAVENOUS
  Filled 2017-08-21 (×4): qty 1

## 2017-08-21 MED ORDER — ONDANSETRON HCL 4 MG PO TABS: 4.0000 mg | ORAL_TABLET | Freq: Four times a day (QID) | ORAL | Status: DC | PRN

## 2017-08-21 MED ORDER — PANTOPRAZOLE SODIUM 40 MG PO TBEC
40.0000 mg | DELAYED_RELEASE_TABLET | Freq: Every day | ORAL | Status: DC
  Administered 2017-08-21 – 2017-08-28 (×7): 40 mg via ORAL
  Filled 2017-08-21 (×7): qty 1

## 2017-08-21 MED ORDER — KETOROLAC TROMETHAMINE 30 MG/ML IJ SOLN
30.0000 mg | Freq: Four times a day (QID) | INTRAMUSCULAR | Status: AC | PRN
  Administered 2017-08-21 – 2017-08-25 (×7): 30 mg via INTRAVENOUS
  Filled 2017-08-21 (×7): qty 1

## 2017-08-21 MED ORDER — ACETAMINOPHEN 650 MG RE SUPP: 650.0000 mg | Freq: Four times a day (QID) | RECTAL | Status: DC | PRN

## 2017-08-21 MED ORDER — SODIUM CHLORIDE 0.9 % IV SOLN: 1000.0000 mL | INTRAVENOUS | Status: DC

## 2017-08-21 MED ORDER — ENOXAPARIN SODIUM 40 MG/0.4ML ~~LOC~~ SOLN
40.0000 mg | SUBCUTANEOUS | Status: DC
  Administered 2017-08-21 – 2017-08-28 (×8): 40 mg via SUBCUTANEOUS
  Filled 2017-08-21 (×8): qty 0.4

## 2017-08-21 MED ORDER — SODIUM CHLORIDE 0.9 % IV SOLN
1000.0000 mL | INTRAVENOUS | Status: DC
  Administered 2017-08-21 – 2017-08-22 (×2): 1000 mL via INTRAVENOUS

## 2017-08-21 MED ORDER — ALBUTEROL SULFATE HFA 108 (90 BASE) MCG/ACT IN AERS: 2.0000 | INHALATION_SPRAY | Freq: Four times a day (QID) | RESPIRATORY_TRACT | Status: DC | PRN

## 2017-08-21 MED ORDER — INSULIN ASPART 100 UNIT/ML ~~LOC~~ SOLN
0.0000 [IU] | Freq: Three times a day (TID) | SUBCUTANEOUS | Status: DC
  Administered 2017-08-21: 2 [IU] via SUBCUTANEOUS
  Administered 2017-08-21 (×2): 3 [IU] via SUBCUTANEOUS
  Administered 2017-08-22 – 2017-08-23 (×2): 2 [IU] via SUBCUTANEOUS
  Administered 2017-08-23: 3 [IU] via SUBCUTANEOUS
  Administered 2017-08-23 – 2017-08-25 (×3): 2 [IU] via SUBCUTANEOUS
  Administered 2017-08-26: 3 [IU] via SUBCUTANEOUS
  Administered 2017-08-27 – 2017-08-28 (×3): 2 [IU] via SUBCUTANEOUS

## 2017-08-21 MED ORDER — ONDANSETRON HCL 4 MG/2ML IJ SOLN
4.0000 mg | Freq: Four times a day (QID) | INTRAMUSCULAR | Status: DC | PRN
  Administered 2017-08-23: 4 mg via INTRAVENOUS
  Filled 2017-08-21: qty 2

## 2017-08-21 MED ORDER — ACETAMINOPHEN 325 MG PO TABS
650.0000 mg | ORAL_TABLET | Freq: Four times a day (QID) | ORAL | Status: DC | PRN
  Administered 2017-08-21 – 2017-08-23 (×3): 650 mg via ORAL
  Filled 2017-08-21 (×4): qty 2

## 2017-08-21 MED ORDER — CEFAZOLIN SODIUM-DEXTROSE 2-4 GM/100ML-% IV SOLN
2.0000 g | Freq: Three times a day (TID) | INTRAVENOUS | Status: DC
  Administered 2017-08-21 – 2017-08-22 (×3): 2 g via INTRAVENOUS
  Filled 2017-08-21 (×5): qty 100

## 2017-08-21 MED ORDER — ALBUTEROL SULFATE (2.5 MG/3ML) 0.083% IN NEBU: 2.5000 mg | INHALATION_SOLUTION | Freq: Four times a day (QID) | RESPIRATORY_TRACT | Status: DC | PRN

## 2017-08-21 MED ORDER — PRAVASTATIN SODIUM 40 MG PO TABS: 20.0000 mg | ORAL_TABLET | Freq: Every day | ORAL | Status: DC

## 2017-08-21 MED ORDER — CLONIDINE HCL 0.2 MG PO TABS
0.2000 mg | ORAL_TABLET | Freq: Two times a day (BID) | ORAL | Status: DC
  Administered 2017-08-21 – 2017-08-28 (×14): 0.2 mg via ORAL
  Filled 2017-08-21 (×14): qty 1

## 2017-08-21 MED ORDER — ACETAMINOPHEN 325 MG PO TABS: 650.0000 mg | ORAL_TABLET | Freq: Four times a day (QID) | ORAL | Status: DC | PRN

## 2017-08-21 NOTE — H&P (Signed)
History and Physical    Heather Walker KVQ:[id number] DOB: [birth year] DOA: 08/20/2017  PCP: [name], DO  Patient coming from: Home  I have personally briefly reviewed patient's old medical records in [place]  Chief Complaint: Right hip pain  HPI: Heather Walker is a 56 y.o. female with medical history significant of DM2, HTN.  Patient presents to the ED with c/o severe lower back and R hip pain.  Persistent.  Onset 2-3 days ago.  Worsening.  Worse with any movement or palpation or walking.  H/o Arthritis.  Sometimes uses wheelchair sometimes walks with cane.  Per EMS screaming in pain on arrival.  Seen in ED for same this AM and discharged.  Denies any IVDU.   ED Course: Patient febrile T 101.3, HR 110s-120s.  BP is elevated 643P systolic.  WBC 11.4k.  K 2.8.  MRI of L spine and R hip are negative.  UA neg, CXR neg, no skin lesions or changes.  CT abd/pelvis neg.  No headache, no meningismus, no cough, no SOB.   Review of Systems: As per HPI otherwise 10 point review of systems negative.   Past Medical History:  Diagnosis Date  . Arthritis   . Asthma   . Depression   . Diabetes mellitus without complication ([place])   . Heart murmur   . Hypertension     Past Surgical History:  Procedure Laterality Date  . ABDOMINAL HYSTERECTOMY    . APPENDECTOMY    . BLADDER SUSPENSION    . CESAREAN SECTION     3 previous  . TUBAL LIGATION       reports that  has never smoked. she has never used smokeless tobacco. She reports that she does not drink alcohol or use drugs.  Allergies  Allergen Reactions  . Atorvastatin Other (See Comments)    Neck stiffness  . Codeine Itching    Family History  Problem Relation Age of Onset  . Hypertension Mother   . Asthma Mother   . Hypertension Sister   . Asthma Sister   . Hypertension Maternal Grandmother   . Heart defect Sister   . Asthma Sister   . Aneurysm Sister   . Asthma Sister      Prior to Admission  medications   Medication Sig Start Date End Date Taking? Authorizing Provider  albuterol (PROVENTIL HFA;VENTOLIN HFA) 108 (90 BASE) MCG/ACT inhaler Inhale 2 puffs into the lungs every 6 (six) hours as needed. For shortness of breath. 02/27/15  Yes Roma Schanz R, DO  albuterol (PROVENTIL) (2.5 MG/3ML) 0.083% nebulizer solution Take 3 mLs (2.5 mg total) by nebulization every 6 (six) hours as needed for wheezing or shortness of breath. 10/16/14  Yes Roma Schanz R, DO  cloNIDine (CATAPRES) 0.2 MG tablet Take 1 tablet (0.2 mg total) by mouth 2 (two) times daily. 05/02/17  Yes [name], DO  cyclobenzaprine (FLEXERIL) 5 MG tablet Take 1 tablet (5 mg total) by mouth 3 (three) times daily as needed for muscle spasms. 08/09/17  Yes Roma Schanz R, DO  empagliflozin (JARDIANCE) 10 MG TABS tablet Take 10 mg by mouth daily. 06/12/17  Yes Roma Schanz R, DO  olmesartan (BENICAR) 20 MG tablet Take 1 tablet (20 mg total) by mouth daily. 05/02/17  Yes [name], DO  Potassium Chloride ER 20 MEQ TBCR Take 40 mEq by mouth daily. 05/02/17  Yes Roma Schanz R, DO  saxagliptin HCl (ONGLYZA)  5 MG TABS tablet Take 1 tablet (5 mg total) by mouth daily. 11/20/16  Yes [name], DO  traMADol (ULTRAM) 50 MG tablet take 1 tablet by mouth every 6 hours if needed for pain 05/02/17  Yes Lowne Lyndal Pulley R, DO  traZODone (DESYREL) 50 MG tablet Take 0.5-1 tablets (25-50 mg total) by mouth at bedtime as needed for sleep. 06/12/17  Yes Roma Schanz R, DO  triamterene-hydrochlorothiazide (MAXZIDE) 75-50 MG tablet Take 1 tablet by mouth daily. 05/02/17  Yes Roma Schanz R, DO  omeprazole (PRILOSEC) 20 MG capsule Take 1 capsule (20 mg total) by mouth daily. 05/02/17   [name], DO    Physical Exam: Vitals:   08/21/17 0030 08/21/17 0100 08/21/17 0200 08/21/17 0230  BP: (!) 151/93  (!) 151/96 (!) 153/96  Pulse: (!) 116 (!) 115 (!) 113 (!) 113    Resp: 17 10 15 16   Temp:      TempSrc:      SpO2: 98% 100% 100% 100%  Weight:      Height:        Constitutional: NAD, calm, comfortable Eyes: PERRL, lids and conjunctivae normal ENMT: Mucous membranes are moist. Posterior pharynx clear of any exudate or lesions.Normal dentition.  Neck: normal, supple, no masses, no thyromegaly Respiratory: clear to auscultation bilaterally, no wheezing, no crackles. Normal respiratory effort. No accessory muscle use.  Cardiovascular: Tachycardic, regular Abdomen: no tenderness, no masses palpated. No hepatosplenomegaly. Bowel sounds positive.  Musculoskeletal: R hip pain with movement or palpation.  Low back pain with straight leg raise of L leg.  R hip skin feels warm compared to elsewhere, but no erythema, skin lesions, etc. Skin: no rashes, lesions, ulcers. No induration Neurologic: CN 2-12 grossly intact. Sensation intact, DTR normal. Strength 5/5 in all 4.  Psychiatric: Normal judgment and insight. Alert and oriented x 3. Normal mood.    Labs on Admission: I have personally reviewed following labs and imaging studies  CBC: Recent Labs  Lab 08/20/17 1640  WBC 11.4*  NEUTROABS 10.1*  HGB 12.7  HCT 37.7  MCV 83.2  PLT 921   Basic Metabolic Panel: Recent Labs  Lab 08/20/17 1640  NA 137  K 2.8*  CL 100*  CO2 27  GLUCOSE 212*  BUN 12  CREATININE 0.98  CALCIUM 8.7*   GFR: Estimated Creatinine Clearance: 69.8 mL/min (by C-G formula based on SCr of 0.98 mg/dL). Liver Function Tests: Recent Labs  Lab 08/20/17 1640  AST 17  ALT 13*  ALKPHOS 77  BILITOT 0.8  PROT 7.3  ALBUMIN 3.5   Recent Labs  Lab 08/20/17 1640  LIPASE 24   No results for input(s): AMMONIA in the last 168 hours. Coagulation Profile: No results for input(s): INR, PROTIME in the last 168 hours. Cardiac Enzymes: No results for input(s): CKTOTAL, CKMB, CKMBINDEX, TROPONINI in the last 168 hours. BNP (last 3 results) No results for input(s): PROBNP in  the last 8760 hours. HbA1C: No results for input(s): HGBA1C in the last 72 hours. CBG: No results for input(s): GLUCAP in the last 168 hours. Lipid Profile: No results for input(s): CHOL, HDL, LDLCALC, TRIG, CHOLHDL, LDLDIRECT in the last 72 hours. Thyroid Function Tests: No results for input(s): TSH, T4TOTAL, FREET4, T3FREE, THYROIDAB in the last 72 hours. Anemia Panel: No results for input(s): VITAMINB12, FOLATE, FERRITIN, TIBC, IRON, RETICCTPCT in the last 72 hours. Urine analysis:    Component Value Date/Time   COLORURINE YELLOW 08/20/2017 1841  APPEARANCEUR CLEAR 08/20/2017 1841   LABSPEC 1.010 08/20/2017 1841   PHURINE 6.0 08/20/2017 1841   GLUCOSEU >=500 (A) 08/20/2017 1841   HGBUR NEGATIVE 08/20/2017 1841   HGBUR negative 09/03/2007 1610   BILIRUBINUR NEGATIVE 08/20/2017 1841   BILIRUBINUR neg 05/09/2016 1324   KETONESUR NEGATIVE 08/20/2017 1841   PROTEINUR NEGATIVE 08/20/2017 1841   UROBILINOGEN 0.2 05/09/2016 1324   UROBILINOGEN 1.0 02/09/2014 1257   NITRITE NEGATIVE 08/20/2017 1841   LEUKOCYTESUR NEGATIVE 08/20/2017 1841    Radiological Exams on Admission: Mr Lumbar Spine W Wo Contrast  Result Date: 08/20/2017 CLINICAL DATA:  Severe RIGHT hip pain radiating to back for 2 days. Back pain beginning last night. Sepsis. History of diabetes. EXAM: MRI LUMBAR SPINE WITHOUT AND WITH CONTRAST TECHNIQUE: Multiplanar and multiecho pulse sequences of the lumbar spine were obtained without and with intravenous contrast. CONTRAST:  17mL MULTIHANCE GADOBENATE DIMEGLUMINE 529 MG/ML IV SOLN COMPARISON:  None. FINDINGS: SEGMENTATION: For the purposes of this report, the last well-formed intervertebral disc will be reported as L5-S1. Transitional anatomy with small S1-2 disc, lumbarized S1 vertebral body. ALIGNMENT: Maintained lumbar lordosis. No malalignment. VERTEBRAE:Vertebral bodies are intact. Intervertebral discs demonstrate normal morphology, mild desiccation L5-S1 disc. Multilevel  mild acute on chronic discogenic endplate changes. No abnormal osseous or suspicious disc enhancement. CONUS MEDULLARIS: Conus medullaris terminates at L2-3 and demonstrates normal morphology and signal characteristics. 2 mm tubular T1 bright signal along the filum terminale with chemical shift artifact consistent with fibro lipomatosis changes. No abnormal cord, leptomeningeal enhancement. PARASPINAL AND SOFT TISSUES: Faint enhancement and bright interstitial STIR signal paraspinal muscles mid to lower lumbar spine. No focal fluid collection. DISC LEVELS: T12-L1 thru L2-3: No disc bulge, canal stenosis nor neural foraminal narrowing. L3-4: No disc bulge. Moderate RIGHT mild LEFT facet arthropathy without canal stenosis or neural foraminal narrowing. L4-5: Small RIGHT subarticular disc protrusion with faint enhancing annular fissure. Moderate RIGHT greater than LEFT facet arthropathy and ligamentum flavum redundancy. Trace RIGHT facet synovial cyst within dorsal epidural space. No canal stenosis. Mild RIGHT neural foraminal narrowing. L5-S1: Small LEFT subarticular disc protrusion. Central enhancing annular fissure. Severe facet arthropathy. No canal stenosis. Moderate RIGHT, moderate to severe LEFT neural foraminal narrowing. IMPRESSION: 1. No MR findings of discitis osteomyelitis. Faintly enhancing paraspinal muscles seen with low-grade strain, less likely myositis. 2. Small RIGHT L4-5 disc protrusion with annular fissure may be acute. 3. Degenerative change of the lumbar spine. No canal stenosis. Neural foraminal narrowing L4-5 and L5-S1: Moderate to severe on the LEFT at L5-S1. 4. Low-lying spinal cord with fibro lipomatosis changes of filum terminale, no cord tethering. Electronically Signed   By: Elon Alas M.D.   On: 08/20/2017 22:43   Mr Hip Right W Wo Contrast  Result Date: 08/20/2017 CLINICAL DATA:  Right hip pain. No known injury. Suspect soft tissue infection. EXAM: MRI OF THE RIGHT HIP  WITHOUT AND WITH CONTRAST TECHNIQUE: Multiplanar, multisequence MR imaging was performed both before and after administration of intravenous contrast. CONTRAST:  35mL MULTIHANCE GADOBENATE DIMEGLUMINE 529 MG/ML IV SOLN COMPARISON:  Right hip radiographs 08/20/2017. MRI lumbar spine 11/16/2012 FINDINGS: Bones: No focal bone lesions identified. Bone cortex appears intact. Marrow signal intensities are homogeneous and normal. Articular cartilage and labrum Articular cartilage:  Appears homogeneous and intact. Labrum:  Appears intact. Joint or bursal effusion Joint effusion: Small joint effusions are present bilaterally and are symmetrical. Bursae:  No abnormal bursal collections. Muscles and tendons Muscles and tendons: Normal appearance. No abnormal signal intensity changes, expansile  process, solid mass, or fluid collections identified. Other findings Miscellaneous: Visualized portions of the pelvic organs are unremarkable. IMPRESSION: No acute process identified. No evidence of acute fracture, bone contusion, or inflammatory change involving the right hip or musculature. Electronically Signed   By: Lucienne Capers M.D.   On: 08/20/2017 23:25   Ct Abdomen Pelvis W Contrast  Result Date: 08/21/2017 CLINICAL DATA:  Back pain EXAM: CT ABDOMEN AND PELVIS WITH CONTRAST TECHNIQUE: Multidetector CT imaging of the abdomen and pelvis was performed using the standard protocol following bolus administration of intravenous contrast. CONTRAST:  123mL ISOVUE-300 IOPAMIDOL (ISOVUE-300) INJECTION 61% COMPARISON:  08/20/2017, 11/10/2011 FINDINGS: Lower chest: Lung bases demonstrate patchy dependent atelectasis. No consolidation or pleural effusion. Heart size within normal limits. Hepatobiliary: Hepatic steatosis. Post cholecystectomy. Prominent extrahepatic bile duct likely due to postsurgical changes. Pancreas: Unremarkable. No pancreatic ductal dilatation or surrounding inflammatory changes. Spleen: Normal in size without  focal abnormality. Adrenals/Urinary Tract: Adrenal glands are unremarkable. Kidneys are normal, without renal calculi, focal lesion, or hydronephrosis. Bladder is unremarkable. Stomach/Bowel: Stomach is within normal limits. Status post appendectomy. No evidence of bowel wall thickening, distention, or inflammatory changes. Vascular/Lymphatic: Aortic atherosclerosis. No enlarged abdominal or pelvic lymph nodes. Reproductive: Status post hysterectomy. No adnexal masses. Other: Negative for free air or free fluid.  Fat in the umbilicus Musculoskeletal: Degenerative changes. No acute or suspicious bone lesion IMPRESSION: 1. No CT evidence for acute intra-abdominal or pelvic abnormality 2. Hepatic steatosis Electronically Signed   By: Donavan Foil M.D.   On: 08/21/2017 02:17   Dg Chest Port 1 View  Result Date: 08/20/2017 CLINICAL DATA:  Nodes sepsis.  Fever.  Shortness of breath. EXAM: PORTABLE CHEST 1 VIEW COMPARISON:  None. FINDINGS: The heart size and mediastinal contours are within normal limits. Both lungs are clear. The visualized skeletal structures are unremarkable. IMPRESSION: No active disease. Electronically Signed   By: Dorise Bullion III M.D   On: 08/20/2017 17:28   Dg Hip Unilat W Or Wo Pelvis 2-3 Views Right  Result Date: 08/20/2017 CLINICAL DATA:  Right hip pain.  No known injury. EXAM: DG HIP (WITH OR WITHOUT PELVIS) 2-3V RIGHT COMPARISON:  None. FINDINGS: There is no evidence of hip fracture or dislocation. There is no evidence of arthropathy or other focal bone abnormality. IMPRESSION: Normal examination. Electronically Signed   By: Claudie Revering M.D.   On: 08/20/2017 09:18    EKG: Independently reviewed.  Assessment/Plan Principal Problem:   Sepsis ([place]) Active Problems:   Essential hypertension   Diabetes mellitus type II, uncontrolled ([place])   Acute right hip pain    1. Sepsis - unknown source 1. IVF: bolus in ED and 125 cc/hr NS 2. Replacing K 3. Empiric zosyn /  vanc 4. BCx pending 5. Keep eye on R hip given sever pain.  Maybe very early bursitis or septic joint?  Although MRI negative. 2. HTN - 1. Hold diuretics 2. Hold losartan 3. Continue Clonadine for now 3. DM2 - 1. Hold home PO hypoglycemics 2. Mod scale SSI AC 4. Hip pain / back pain - 1. Imaging negative for cause 2. Dilaudid PRN 3. Toradol PRN  DVT prophylaxis: Lovenox Code Status: Full Family Communication: No family in room Disposition Plan: Home after admit Consults called: None Admission status: Admit to inpatient - inpatient status for IV abx and further sepsis work up for the unknown source.   Etta Quill DO [facility] Pager [phone]  If 7AM-7PM, please contact day team taking care  of patient [email or web address] Password [email or web address]  08/21/2017, 2:55 AM

## 2017-08-21 NOTE — Progress Notes (Signed)
PHARMACY - PHYSICIAN COMMUNICATION CRITICAL VALUE ALERT - BLOOD CULTURE IDENTIFICATION (BCID)  Results for orders placed or performed during the hospital encounter of 08/20/17  Blood Culture ID Panel (Reflexed) (Collected: 08/20/2017  5:03 PM)  Result Value Ref Range   Enterococcus species NOT DETECTED NOT DETECTED   Listeria monocytogenes NOT DETECTED NOT DETECTED   Staphylococcus species DETECTED (A) NOT DETECTED   Staphylococcus aureus DETECTED (A) NOT DETECTED   Methicillin resistance NOT DETECTED NOT DETECTED   Streptococcus species NOT DETECTED NOT DETECTED   Streptococcus agalactiae NOT DETECTED NOT DETECTED   Streptococcus pneumoniae NOT DETECTED NOT DETECTED   Streptococcus pyogenes NOT DETECTED NOT DETECTED   Acinetobacter baumannii NOT DETECTED NOT DETECTED   Enterobacteriaceae species NOT DETECTED NOT DETECTED   Enterobacter cloacae complex NOT DETECTED NOT DETECTED   Escherichia coli NOT DETECTED NOT DETECTED   Klebsiella oxytoca NOT DETECTED NOT DETECTED   Klebsiella pneumoniae NOT DETECTED NOT DETECTED   Proteus species NOT DETECTED NOT DETECTED   Serratia marcescens NOT DETECTED NOT DETECTED   Haemophilus influenzae NOT DETECTED NOT DETECTED   Neisseria meningitidis NOT DETECTED NOT DETECTED   Pseudomonas aeruginosa NOT DETECTED NOT DETECTED   Candida albicans NOT DETECTED NOT DETECTED   Candida glabrata NOT DETECTED NOT DETECTED   Candida krusei NOT DETECTED NOT DETECTED   Candida parapsilosis NOT DETECTED NOT DETECTED   Candida tropicalis NOT DETECTED NOT DETECTED    Name of physician (or Provider) Contacted: Dr. Thereasa Solo  Changes to prescribed antibiotics required: Pt with MSSA bacteremia and currently on Zosyn and vancomycin. Can likely deescalate. ID to see.   Vincenza Hews, PharmD, BCPS 08/21/2017, 8:45 AM

## 2017-08-21 NOTE — Progress Notes (Signed)
Pharmacy Antibiotic Note  Heather Walker is a 56 y.o. female admitted on 08/20/2017 with sepsis. Blood cultures have grown MSSA from 11/4. Was originally started on zosyn and vancomycin. Consulted to de-escalate to cefazolin. Scr yesterday was 0.98 (CrCl 68 mL/min).   Plan: Cefazolin 2 grams every 8 hours Monitor renal function, cx results, clinical picture Would recommend repeat blood cultures and consider TEE  Height: 5\' 2"  (157.5 cm) Weight: 198 lb (89.8 kg) IBW/kg (Calculated) : 50.1  Temp (24hrs), Avg:99.7 F (37.6 C), Min:98.9 F (37.2 C), Max:101.3 F (38.5 C)  Recent Labs  Lab 08/20/17 1640 08/20/17 1659 08/20/17 2243  WBC 11.4*  --   --   CREATININE 0.98  --   --   LATICACIDVEN  --  1.70 2.39*    Estimated Creatinine Clearance: 68.4 mL/min (by C-G formula based on SCr of 0.98 mg/dL).    Allergies  Allergen Reactions  . Atorvastatin Other (See Comments)    Neck stiffness  . Codeine Itching   Antimicrobials this admission:  Zosyn  11/4 >> 11/5 Vancomycin 11/4 >> 11/5 Cefazolin 11/5 >>  Dose adjustments this admission:  N/A  Microbiology results:  11/4 BCx: MSSA 11/4 UCx: pending   Doylene Canard, PharmD Clinical Pharmacist  Pager: [phone] Clinical Phone for 08/21/2017 until 3:30pm: [phone] If after 3:30pm, please call main pharmacy at [phone] 08/21/2017 2:33 PM

## 2017-08-21 NOTE — Progress Notes (Signed)
[place] TEAM 1 - Stepdown/ICU TEAM  Heather Walker  ASN:[id number] DOB: September 05, 1961 DOA: 08/20/2017 PCP: [name], DO    Brief Narrative:  56 y.o. female with a history of DM2 and HTN who presented to the ED with c/o severe persistent low back and R hip pain for 2-3 days, worse with any movement or palpation or walking.  Per EMS she was screaming in pain on arrival.  MRI of the L spine and R hip in the ED were unremarkable.  No overlying skin lesions were noted on exam.    Significant Events: 11/4 admit through ED  Subjective: Sleeping comfortably.    Assessment & Plan:  MSSA bacteremia (2 of 2 blood cx)  No clear source - ID following and directing antibiotic choice/length of use   Low back/hip pain Suspect septic arthritis in setting of above, but no acute findings on MRI  Hypokalemia  HTN  DM2   DVT prophylaxis: lovenox  Code Status: FULL CODE Family Communication: no family present at time of exam  Disposition Plan:   Consultants:  ID  Antimicrobials:  Zosyn 11/4 > 11/5 Vanc 11/4 Ancef 11/5 >  Objective: Blood pressure (!) 138/94, pulse (!) 107, temperature 99.1 F (37.3 C), resp. rate 17, height 5\' 2"  (1.575 m), weight 89.8 kg (198 lb), SpO2 93 %.  Intake/Output Summary (Last 24 hours) at 08/21/2017 1533 Last data filed at 08/21/2017 0500 Gross per 24 hour  Intake 4630.33 ml  Output -  Net 4630.33 ml   Filed Weights   08/20/17 1616 08/20/17 1619 08/21/17 0500  Weight: 89.8 kg (198 lb) 89.8 kg (198 lb) 89.8 kg (198 lb)    Examination: Pt seen in f/u  CBC: Recent Labs  Lab 08/20/17 1640  WBC 11.4*  NEUTROABS 10.1*  HGB 12.7  HCT 37.7  MCV 83.2  PLT 193   Basic Metabolic Panel: Recent Labs  Lab 08/20/17 1640  NA 137  K 2.8*  CL 100*  CO2 27  GLUCOSE 212*  BUN 12  CREATININE 0.98  CALCIUM 8.7*   GFR: Estimated Creatinine Clearance: 68.4 mL/min (by C-G formula based on SCr of 0.98 mg/dL).  Liver Function  Tests: Recent Labs  Lab 08/20/17 1640  AST 17  ALT 13*  ALKPHOS 77  BILITOT 0.8  PROT 7.3  ALBUMIN 3.5   Recent Labs  Lab 08/20/17 1640  LIPASE 24    HbA1C: Hgb A1c MFr Bld  Date/Time Value Ref Range Status  05/02/2017 03:02 PM 9.7 (H) 4.6 - 6.5 % Final    Comment:    Glycemic Control Guidelines for People with Diabetes:Non Diabetic:  <6%Goal of Therapy: <7%Additional Action Suggested:  >8%   11/10/2016 05:24 PM 9.7 (H) <5.7 % Final    Comment:      For someone without known diabetes, a hemoglobin A1c value of 6.5% or greater indicates that they may have diabetes and this should be confirmed with a follow-up test.   For someone with known diabetes, a value <7% indicates that their diabetes is well controlled and a value greater than or equal to 7% indicates suboptimal control. A1c targets should be individualized based on duration of diabetes, age, comorbid conditions, and other considerations.   Currently, no consensus exists for use of hemoglobin A1c for diagnosis of diabetes for children.       CBG: Recent Labs  Lab 08/21/17 0742 08/21/17 1137  GLUCAP 168* 159*    Recent Results (from the past 240 hour(s))  Blood Culture (routine x 2)     Status: None (Preliminary result)   Collection Time: 08/20/17  5:03 PM  Result Value Ref Range Status   Specimen Description BLOOD RIGHT ANTECUBITAL  Final   Special Requests   Final    BOTTLES DRAWN AEROBIC AND ANAEROBIC Blood Culture adequate volume   Culture  Setup Time   Final    GRAM POSITIVE COCCI IN CLUSTERS IN BOTH AEROBIC AND ANAEROBIC BOTTLES Organism ID to follow CRITICAL RESULT CALLED TO, READ BACK BY AND VERIFIED WITH: A. JOHNSTON PHARMD, AT [place] 08/21/17 BY D. VANHOOK    Culture GRAM POSITIVE COCCI  Final   Report Status PENDING  Incomplete  Blood Culture ID Panel (Reflexed)     Status: Abnormal   Collection Time: 08/20/17  5:03 PM  Result Value Ref Range Status   Enterococcus species NOT DETECTED  NOT DETECTED Final   Listeria monocytogenes NOT DETECTED NOT DETECTED Final   Staphylococcus species DETECTED (A) NOT DETECTED Final    Comment: CRITICAL RESULT CALLED TO, READ BACK BY AND VERIFIED WITH: A. JOHNSTON PHARMD, AT 9357 08/21/17 BY D. VANHOOK    Staphylococcus aureus DETECTED (A) NOT DETECTED Final    Comment: Methicillin (oxacillin) susceptible Staphylococcus aureus (MSSA). Preferred therapy is anti staphylococcal beta lactam antibiotic (Cefazolin or Nafcillin), unless clinically contraindicated. CRITICAL RESULT CALLED TO, READ BACK BY AND VERIFIED WITH: A. JOHNSTON PHARMD, AT 0177 08/21/17 BY D. VANHOOK    Methicillin resistance NOT DETECTED NOT DETECTED Final   Streptococcus species NOT DETECTED NOT DETECTED Final   Streptococcus agalactiae NOT DETECTED NOT DETECTED Final   Streptococcus pneumoniae NOT DETECTED NOT DETECTED Final   Streptococcus pyogenes NOT DETECTED NOT DETECTED Final   Acinetobacter baumannii NOT DETECTED NOT DETECTED Final   Enterobacteriaceae species NOT DETECTED NOT DETECTED Final   Enterobacter cloacae complex NOT DETECTED NOT DETECTED Final   Escherichia coli NOT DETECTED NOT DETECTED Final   Klebsiella oxytoca NOT DETECTED NOT DETECTED Final   Klebsiella pneumoniae NOT DETECTED NOT DETECTED Final   Proteus species NOT DETECTED NOT DETECTED Final   Serratia marcescens NOT DETECTED NOT DETECTED Final   Haemophilus influenzae NOT DETECTED NOT DETECTED Final   Neisseria meningitidis NOT DETECTED NOT DETECTED Final   Pseudomonas aeruginosa NOT DETECTED NOT DETECTED Final   Candida albicans NOT DETECTED NOT DETECTED Final   Candida glabrata NOT DETECTED NOT DETECTED Final   Candida krusei NOT DETECTED NOT DETECTED Final   Candida parapsilosis NOT DETECTED NOT DETECTED Final   Candida tropicalis NOT DETECTED NOT DETECTED Final  Blood Culture (routine x 2)     Status: None (Preliminary result)   Collection Time: 08/20/17  5:05 PM  Result Value Ref Range  Status   Specimen Description BLOOD RIGHT HAND  Final   Special Requests   Final    BOTTLES DRAWN AEROBIC ONLY Blood Culture adequate volume   Culture  Setup Time   Final    GRAM POSITIVE COCCI IN CLUSTERS AEROBIC BOTTLE ONLY CRITICAL VALUE NOTED.  VALUE IS CONSISTENT WITH PREVIOUSLY REPORTED AND CALLED VALUE.    Culture NO GROWTH < 24 HOURS  Final   Report Status PENDING  Incomplete  Urine culture     Status: None   Collection Time: 08/20/17  6:53 PM  Result Value Ref Range Status   Specimen Description URINE, CATHETERIZED  Final   Special Requests NONE  Final   Culture NO GROWTH  Final   Report Status 08/21/2017 FINAL  Final  Scheduled Meds: . cloNIDine  0.2 mg Oral BID  . enoxaparin (LOVENOX) injection  40 mg Subcutaneous Q24H  . insulin aspart  0-15 Units Subcutaneous TID WC  . pantoprazole  40 mg Oral Daily   Continuous Infusions: . sodium chloride 1,000 mL (08/20/17 2230)  .  ceFAZolin (ANCEF) IV       LOS: 0 days     [name], MD [facility] Office  [phone] Pager - Text Page per Amion as per below:  On-Call/Text Page:      [email or web address]      password [email or web address]  If 7PM-7AM, please contact night-coverage [email or web address] Password [facility] 08/21/2017, 3:33 PM

## 2017-08-21 NOTE — Consult Note (Signed)
[facility] for Infectious Disease    Date of Admission:  08/20/2017     Total days of antibiotics 2  Vancomycin 11/4 >> Zosyn 11/4 >>                Reason for Consult: SIRS/Sepsis and Bacteremia   Referring Provider: Thereasa Solo Primary Care Provider: Carollee Herter, Alferd Apa, DO  Assessment/Plan:  MSSA Bacteremia - Change antibiotics to cefazolin to narrow spectrum. Recommend TEE to rule out endocarditis given no significant findings on CT abdomen, MR lumbar spine and right hip. Repeat blood cultures in the am.   Back pain - chronic back pain with mild improvement likely musculoskeletal. No significant evidence of infection on MRI.    Principal Problem:   Sepsis ([place]) Active Problems:   Essential hypertension   Diabetes mellitus type II, uncontrolled ([place])   Acute right hip pain   . cloNIDine  0.2 mg Oral BID  . enoxaparin (LOVENOX) injection  40 mg Subcutaneous Q24H  . insulin aspart  0-15 Units Subcutaneous TID WC  . pantoprazole  40 mg Oral Daily     HPI: Heather Walker is a 56 y.o. female with PMH asthma, arthritis, diabetes, and hypertension who reported to the ED on 11/4 with the associated symptoms of increasing back pain starting 2-3 days prior to her initial presentation. Pain located in her lumbar spine and right with and aggravated with movement, touch and walking. Modifying factors include muscle relaxants and ibuprofen. Course of the symptoms progressively worsened prompting her to seek further care.   Hospital Course: Brought to the ED via EMS and noted to be febrile and tachycardic. Some weakness and tingling of her right leg. In the ED found to have midline lumbar spine tenderness and also over the right lateral hip. Unable to raise her right leg actively off the stretcher. Fever of 101.6 in the ED.  Labs with potassium of 2.8, WBC 11.4, glucose > 500 and lactic acid of 2.3. EKG with sinus tachycardia and abnormal R wave progression with an old inferior  infarct and left ventricular hypertrophy.  MRI of the lumbar spine with no findings of discitis/osteomyelitis with concern for low-grade strain and less likely myositis; small right L4-L5 disc protrusion with annular fissure; degenerative change of the lumbar spine with no canal stenosis; low lying spinal cord with fibro-lipomatosis changes of filum terminale and no cord tethering. Chest x-ray with no active disease. X-ray of the hip with no evidence of fracture, dislocation of arthropathy. Diagnosed with SIRS/Sepsis of unknown source. Current antibiotics are vancomycin and zoysn for empiric coverage. Blood cultures positive for MSSA.   Review of Systems: Review of Systems  Constitutional: Negative for chills and fever.  Respiratory: Negative for cough, sputum production, shortness of breath and wheezing.   Cardiovascular: Positive for chest pain (Occasional).  Gastrointestinal: Negative for abdominal pain, diarrhea, nausea and vomiting.  Genitourinary: Negative for dysuria, flank pain, frequency, hematuria and urgency.  Musculoskeletal: Positive for back pain.     Past Medical History:  Diagnosis Date  . Arthritis   . Asthma   . Depression   . Diabetes mellitus without complication ([place])   . Heart murmur   . Hypertension     Social History   Tobacco Use  . Smoking status: Never Smoker  . Smokeless tobacco: Never Used  Substance Use Topics  . Alcohol use: No  . Drug use: No    Family History  Problem Relation Age of Onset  .  Hypertension Mother   . Asthma Mother   . Hypertension Sister   . Asthma Sister   . Hypertension Maternal Grandmother   . Heart defect Sister   . Asthma Sister   . Aneurysm Sister   . Asthma Sister     Allergies  Allergen Reactions  . Atorvastatin Other (See Comments)    Neck stiffness  . Codeine Itching    OBJECTIVE: Blood pressure (!) 138/94, pulse (!) 107, temperature 99.5 F (37.5 C), temperature source Axillary, resp. rate 17, height 5'  2" (1.575 m), weight 198 lb (89.8 kg), SpO2 93 %.  Physical Exam  Constitutional: She is oriented to person, place, and time and well-developed, well-nourished, and in no distress. No distress.  Cardiovascular: Normal rate, regular rhythm, normal heart sounds and intact distal pulses. Exam reveals no gallop and no friction rub.  No murmur heard. Pulmonary/Chest: Effort normal and breath sounds normal. No respiratory distress. She has no wheezes. She has no rales. She exhibits no tenderness.  Abdominal: Soft. Bowel sounds are normal. She exhibits no distension and no mass. There is no tenderness. There is no rebound and no guarding.  Musculoskeletal:  Right hip - No obvious deformity, discoloration or edema. Tenderness over anterior and lateral hip. Unable to flex hip against gravity. Distal pulses and sensation are intact.   Neurological: She is alert and oriented to person, place, and time.  Skin: Skin is warm and dry. No rash noted.  Psychiatric: Mood, memory, affect and judgment normal.    Lab Results Lab Results  Component Value Date   WBC 11.4 (H) 08/20/2017   HGB 12.7 08/20/2017   HCT 37.7 08/20/2017   MCV 83.2 08/20/2017   PLT 155 08/20/2017    Lab Results  Component Value Date   CREATININE 0.98 08/20/2017   BUN 12 08/20/2017   NA 137 08/20/2017   K 2.8 (L) 08/20/2017   CL 100 (L) 08/20/2017   CO2 27 08/20/2017    Lab Results  Component Value Date   ALT 13 (L) 08/20/2017   AST 17 08/20/2017   ALKPHOS 77 08/20/2017   BILITOT 0.8 08/20/2017     Microbiology: Recent Results (from the past 240 hour(s))  Blood Culture (routine x 2)     Status: None (Preliminary result)   Collection Time: 08/20/17  5:03 PM  Result Value Ref Range Status   Specimen Description BLOOD RIGHT ANTECUBITAL  Final   Special Requests   Final    BOTTLES DRAWN AEROBIC AND ANAEROBIC Blood Culture adequate volume   Culture  Setup Time   Final    GRAM POSITIVE COCCI IN CLUSTERS IN BOTH AEROBIC  AND ANAEROBIC BOTTLES Organism ID to follow CRITICAL RESULT CALLED TO, READ BACK BY AND VERIFIED WITH: A. JOHNSTON PHARMD, AT 0748 08/21/17 BY D. VANHOOK    Culture GRAM POSITIVE COCCI  Final   Report Status PENDING  Incomplete  Blood Culture ID Panel (Reflexed)     Status: Abnormal   Collection Time: 08/20/17  5:03 PM  Result Value Ref Range Status   Enterococcus species NOT DETECTED NOT DETECTED Final   Listeria monocytogenes NOT DETECTED NOT DETECTED Final   Staphylococcus species DETECTED (A) NOT DETECTED Final    Comment: CRITICAL RESULT CALLED TO, READ BACK BY AND VERIFIED WITH: A. JOHNSTON PHARMD, AT 8338 08/21/17 BY D. VANHOOK    Staphylococcus aureus DETECTED (A) NOT DETECTED Final    Comment: Methicillin (oxacillin) susceptible Staphylococcus aureus (MSSA). Preferred therapy is anti staphylococcal beta lactam  antibiotic (Cefazolin or Nafcillin), unless clinically contraindicated. CRITICAL RESULT CALLED TO, READ BACK BY AND VERIFIED WITH: A. JOHNSTON PHARMD, AT 4010 08/21/17 BY D. VANHOOK    Methicillin resistance NOT DETECTED NOT DETECTED Final   Streptococcus species NOT DETECTED NOT DETECTED Final   Streptococcus agalactiae NOT DETECTED NOT DETECTED Final   Streptococcus pneumoniae NOT DETECTED NOT DETECTED Final   Streptococcus pyogenes NOT DETECTED NOT DETECTED Final   Acinetobacter baumannii NOT DETECTED NOT DETECTED Final   Enterobacteriaceae species NOT DETECTED NOT DETECTED Final   Enterobacter cloacae complex NOT DETECTED NOT DETECTED Final   Escherichia coli NOT DETECTED NOT DETECTED Final   Klebsiella oxytoca NOT DETECTED NOT DETECTED Final   Klebsiella pneumoniae NOT DETECTED NOT DETECTED Final   Proteus species NOT DETECTED NOT DETECTED Final   Serratia marcescens NOT DETECTED NOT DETECTED Final   Haemophilus influenzae NOT DETECTED NOT DETECTED Final   Neisseria meningitidis NOT DETECTED NOT DETECTED Final   Pseudomonas aeruginosa NOT DETECTED NOT DETECTED  Final   Candida albicans NOT DETECTED NOT DETECTED Final   Candida glabrata NOT DETECTED NOT DETECTED Final   Candida krusei NOT DETECTED NOT DETECTED Final   Candida parapsilosis NOT DETECTED NOT DETECTED Final   Candida tropicalis NOT DETECTED NOT DETECTED Final  Blood Culture (routine x 2)     Status: None (Preliminary result)   Collection Time: 08/20/17  5:05 PM  Result Value Ref Range Status   Specimen Description BLOOD RIGHT HAND  Final   Special Requests   Final    BOTTLES DRAWN AEROBIC ONLY Blood Culture adequate volume   Culture  Setup Time   Final    GRAM POSITIVE COCCI IN CLUSTERS AEROBIC BOTTLE ONLY CRITICAL VALUE NOTED.  VALUE IS CONSISTENT WITH PREVIOUSLY REPORTED AND CALLED VALUE.    Culture PENDING  Incomplete   Report Status PENDING  Incomplete  Urine culture     Status: None (Preliminary result)   Collection Time: 08/20/17  6:53 PM  Result Value Ref Range Status   Specimen Description URINE, CATHETERIZED  Final   Special Requests NONE  Final   Culture PENDING  Incomplete   Report Status PENDING  Incomplete     Terri Piedra, NP [place] [facility] [phone] Pager [phone] Cell  08/21/2017 10:03 AM

## 2017-08-22 LAB — GLUCOSE, CAPILLARY
Glucose-Capillary: 131 mg/dL — ABNORMAL HIGH (ref 65–99)
Glucose-Capillary: 77 mg/dL (ref 65–99)
Glucose-Capillary: 125 mg/dL — ABNORMAL HIGH (ref 65–99)
Glucose-Capillary: 94 mg/dL (ref 65–99)

## 2017-08-22 LAB — CBC
HCT: 30.2 % — ABNORMAL LOW (ref 36.0–46.0)
Hemoglobin: 10 g/dL — ABNORMAL LOW (ref 12.0–15.0)
MCH: 27.2 pg (ref 26.0–34.0)
MCHC: 33.1 g/dL (ref 30.0–36.0)
MCV: 82.3 fL (ref 78.0–100.0)
Platelets: 110 10*3/uL — ABNORMAL LOW (ref 150–400)
RBC: 3.67 MIL/uL — ABNORMAL LOW (ref 3.87–5.11)
RDW: 15 % (ref 11.5–15.5)
WBC: 6.8 10*3/uL (ref 4.0–10.5)

## 2017-08-22 LAB — COMPREHENSIVE METABOLIC PANEL
ALT: 15 U/L (ref 14–54)
AST: 24 U/L (ref 15–41)
Albumin: 2.3 g/dL — ABNORMAL LOW (ref 3.5–5.0)
Alkaline Phosphatase: 69 U/L (ref 38–126)
Anion gap: 6 (ref 5–15)
BUN: 13 mg/dL (ref 6–20)
CO2: 21 mmol/L — ABNORMAL LOW (ref 22–32)
Calcium: 7.4 mg/dL — ABNORMAL LOW (ref 8.9–10.3)
Chloride: 110 mmol/L (ref 101–111)
Creatinine, Ser: 1.09 mg/dL — ABNORMAL HIGH (ref 0.44–1.00)
GFR calc Af Amer: >60 mL/min (ref >60)
GFR calc non Af Amer: 56 mL/min — ABNORMAL LOW (ref >60)
Glucose, Bld: 112 mg/dL — ABNORMAL HIGH (ref 65–99)
Potassium: 3.4 mmol/L — ABNORMAL LOW (ref 3.5–5.1)
Sodium: 137 mmol/L (ref 135–145)
Total Bilirubin: 0.5 mg/dL (ref 0.3–1.2)
Total Protein: 5.6 g/dL — ABNORMAL LOW (ref 6.5–8.1)

## 2017-08-22 LAB — MAGNESIUM: Magnesium: 1.4 mg/dL — ABNORMAL LOW (ref 1.7–2.4)

## 2017-08-22 MED ORDER — MAGNESIUM SULFATE 2 GM/50ML IV SOLN
2.0000 g | Freq: Once | INTRAVENOUS | Status: AC
  Administered 2017-08-22: 2 g via INTRAVENOUS
  Filled 2017-08-22: qty 50

## 2017-08-22 MED ORDER — CEFAZOLIN SODIUM-DEXTROSE 2-4 GM/100ML-% IV SOLN
2.0000 g | Freq: Three times a day (TID) | INTRAVENOUS | Status: DC
  Administered 2017-08-22 – 2017-08-28 (×18): 2 g via INTRAVENOUS
  Filled 2017-08-22 (×20): qty 100

## 2017-08-22 MED ORDER — TRAMADOL HCL 50 MG PO TABS: 50.0000 mg | ORAL_TABLET | Freq: Four times a day (QID) | ORAL | Status: DC | PRN

## 2017-08-22 MED ORDER — POTASSIUM CHLORIDE CRYS ER 20 MEQ PO TBCR
40.0000 meq | EXTENDED_RELEASE_TABLET | Freq: Two times a day (BID) | ORAL | Status: AC
  Administered 2017-08-22 – 2017-08-23 (×3): 40 meq via ORAL
  Filled 2017-08-22 (×3): qty 2

## 2017-08-22 MED ORDER — OXYCODONE HCL 5 MG PO TABS
5.0000 mg | ORAL_TABLET | ORAL | Status: DC | PRN
  Administered 2017-08-22 – 2017-08-23 (×3): 10 mg via ORAL
  Administered 2017-08-23 – 2017-08-26 (×7): 5 mg via ORAL
  Administered 2017-08-27 – 2017-08-28 (×3): 10 mg via ORAL
  Filled 2017-08-22 (×2): qty 2
  Filled 2017-08-22 (×2): qty 1
  Filled 2017-08-22: qty 2
  Filled 2017-08-22: qty 1
  Filled 2017-08-22 (×3): qty 2
  Filled 2017-08-22 (×2): qty 1
  Filled 2017-08-22: qty 2
  Filled 2017-08-22: qty 1

## 2017-08-22 MED ORDER — SODIUM CHLORIDE 0.9 % IV SOLN
1000.0000 mL | INTRAVENOUS | Status: DC
  Administered 2017-08-22: 1000 mL via INTRAVENOUS

## 2017-08-22 NOTE — Progress Notes (Signed)
[place] for Infectious Disease  Date of Admission:  08/20/2017     Total days of antibiotics 3  Ancef 11/5 >> Vancomycin 11/4 >> 11/5 Zoysn 11/4 >> 11/5         ASSESSMENT/PLAN   Principal Problem:   Sepsis [facility]) Active Problems:   Essential hypertension   Diabetes mellitus type II, uncontrolled ([place])   Acute right hip pain   . cloNIDine  0.2 mg Oral BID  . enoxaparin (LOVENOX) injection  40 mg Subcutaneous Q24H  . insulin aspart  0-15 Units Subcutaneous TID WC  . pantoprazole  40 mg Oral Daily    SUBJECTIVE: 56 year old female with PMH of DM and hypertension with increasing back and right hip pain noted to have MSSA bacteremia. MRI of the lumbar spine and R hip were unremarkable. Current antibiotic is Ancef. With fever overnight. Awaiting TEE to rule out endocarditis. Repeat blood cultures are pending.   MSSA bacteremia - Continue current dosage of Ancef. Await follow up blood cultures and TEE results to determine length of treatment. Monitor CBC and fevers.   Back/Hip pain - Continues to have back and hip pain despite negative MRI of the lumbar spine and right hip with no evidence of infection. Recommend out of bed as this may be a contributing factor.   Allergies  Allergen Reactions  . Atorvastatin Other (See Comments)    Neck stiffness  . Codeine Itching     Review of Systems: Review of Systems  Constitutional: Positive for chills and fever. Negative for weight loss.  Cardiovascular: Negative for chest pain.  Gastrointestinal: Negative for abdominal pain, diarrhea, nausea and vomiting.  Musculoskeletal: Positive for back pain.  Skin: Negative for rash.  Neurological: Negative for weakness.     OBJECTIVE: Vitals:   08/21/17 2155 08/22/17 0048 08/22/17 0150 08/22/17 0603  BP: 136/88   (!) 136/92  Pulse: (!) 119   (!) 109  Resp: 20   18  Temp: 100 F (37.8 C) (!) 100.9 F (38.3 C) 99.3 F (37.4 C) 99.1 F (37.3 C)  TempSrc: Oral Oral Oral  Oral  SpO2: 94%   96%  Weight:      Height:       Body mass index is 36.21 kg/m.  Physical Exam  Constitutional: She is oriented to person, place, and time and well-developed, well-nourished, and in no distress. No distress.  Pulmonary/Chest: Effort normal and breath sounds normal. No respiratory distress. She has no wheezes. She has no rales. She exhibits no tenderness.  Musculoskeletal:  Right hip - No obvious deformity, discoloration or deformity. Tenderness over the anterior and lateral hip with continued decreased motion.   Neurological: She is alert and oriented to person, place, and time.  Skin: Skin is warm and dry. No rash noted. No erythema. No pallor.    Lab Results Lab Results  Component Value Date   WBC 6.8 08/22/2017   HGB 10.0 (L) 08/22/2017   HCT 30.2 (L) 08/22/2017   MCV 82.3 08/22/2017   PLT 110 (L) 08/22/2017    Lab Results  Component Value Date   CREATININE 1.09 (H) 08/22/2017   BUN 13 08/22/2017   NA 137 08/22/2017   K 3.4 (L) 08/22/2017   CL 110 08/22/2017   CO2 21 (L) 08/22/2017    Lab Results  Component Value Date   ALT 15 08/22/2017   AST 24 08/22/2017   ALKPHOS 69 08/22/2017   BILITOT 0.5 08/22/2017     Microbiology:  Recent Results (from the past 240 hour(s))  Blood Culture (routine x 2)     Status: Abnormal (Preliminary result)   Collection Time: 08/20/17  5:03 PM  Result Value Ref Range Status   Specimen Description BLOOD RIGHT ANTECUBITAL  Final   Special Requests   Final    BOTTLES DRAWN AEROBIC AND ANAEROBIC Blood Culture adequate volume   Culture  Setup Time   Final    GRAM POSITIVE COCCI IN CLUSTERS IN BOTH AEROBIC AND ANAEROBIC BOTTLES CRITICAL RESULT CALLED TO, READ BACK BY AND VERIFIED WITH: A. JOHNSTON PHARMD, AT [place] 08/21/17 BY D. VANHOOK    Culture (A)  Final    STAPHYLOCOCCUS AUREUS SUSCEPTIBILITIES TO FOLLOW    Report Status PENDING  Incomplete  Blood Culture ID Panel (Reflexed)     Status: Abnormal   Collection  Time: 08/20/17  5:03 PM  Result Value Ref Range Status   Enterococcus species NOT DETECTED NOT DETECTED Final   Listeria monocytogenes NOT DETECTED NOT DETECTED Final   Staphylococcus species DETECTED (A) NOT DETECTED Final    Comment: CRITICAL RESULT CALLED TO, READ BACK BY AND VERIFIED WITH: A. JOHNSTON PHARMD, AT 8546 08/21/17 BY D. VANHOOK    Staphylococcus aureus DETECTED (A) NOT DETECTED Final    Comment: Methicillin (oxacillin) susceptible Staphylococcus aureus (MSSA). Preferred therapy is anti staphylococcal beta lactam antibiotic (Cefazolin or Nafcillin), unless clinically contraindicated. CRITICAL RESULT CALLED TO, READ BACK BY AND VERIFIED WITH: A. JOHNSTON PHARMD, AT 2703 08/21/17 BY D. VANHOOK    Methicillin resistance NOT DETECTED NOT DETECTED Final   Streptococcus species NOT DETECTED NOT DETECTED Final   Streptococcus agalactiae NOT DETECTED NOT DETECTED Final   Streptococcus pneumoniae NOT DETECTED NOT DETECTED Final   Streptococcus pyogenes NOT DETECTED NOT DETECTED Final   Acinetobacter baumannii NOT DETECTED NOT DETECTED Final   Enterobacteriaceae species NOT DETECTED NOT DETECTED Final   Enterobacter cloacae complex NOT DETECTED NOT DETECTED Final   Escherichia coli NOT DETECTED NOT DETECTED Final   Klebsiella oxytoca NOT DETECTED NOT DETECTED Final   Klebsiella pneumoniae NOT DETECTED NOT DETECTED Final   Proteus species NOT DETECTED NOT DETECTED Final   Serratia marcescens NOT DETECTED NOT DETECTED Final   Haemophilus influenzae NOT DETECTED NOT DETECTED Final   Neisseria meningitidis NOT DETECTED NOT DETECTED Final   Pseudomonas aeruginosa NOT DETECTED NOT DETECTED Final   Candida albicans NOT DETECTED NOT DETECTED Final   Candida glabrata NOT DETECTED NOT DETECTED Final   Candida krusei NOT DETECTED NOT DETECTED Final   Candida parapsilosis NOT DETECTED NOT DETECTED Final   Candida tropicalis NOT DETECTED NOT DETECTED Final  Blood Culture (routine x 2)      Status: Abnormal (Preliminary result)   Collection Time: 08/20/17  5:05 PM  Result Value Ref Range Status   Specimen Description BLOOD RIGHT HAND  Final   Special Requests   Final    BOTTLES DRAWN AEROBIC ONLY Blood Culture adequate volume   Culture  Setup Time   Final    GRAM POSITIVE COCCI IN CLUSTERS AEROBIC BOTTLE ONLY CRITICAL VALUE NOTED.  VALUE IS CONSISTENT WITH PREVIOUSLY REPORTED AND CALLED VALUE.    Culture STAPHYLOCOCCUS AUREUS (A)  Final   Report Status PENDING  Incomplete  Urine culture     Status: None   Collection Time: 08/20/17  6:53 PM  Result Value Ref Range Status   Specimen Description URINE, CATHETERIZED  Final   Special Requests NONE  Final   Culture NO GROWTH  Final  Report Status 08/21/2017 FINAL  Final     Terri Piedra, NP [facility] [phone] Pager [phone] Cell  08/22/2017 11:17 AM

## 2017-08-22 NOTE — Progress Notes (Signed)
[facility] TEAM 1 - Stepdown/ICU TEAM  Heather Walker  NWG:[id number] DOB: 09-Sep-1961 DOA: 08/20/2017 PCP: [name], DO    Brief Narrative:  56 y.o. female with a history of DM2 and HTN who presented to the ED with c/o severe persistent low back and R hip pain for 2-3 days, worse with any movement or palpation or walking.  Per EMS she was screaming in pain on arrival.  MRI of the L spine and R hip in the ED were unremarkable.  No overlying skin lesions were noted on exam.    Significant Events: 11/4 admit through ED  Subjective: Pt is sleeping soundly, and difficult to wake up.  On waking she c/o pain in her back and hips.  She denies sob or chest pain.    Assessment & Plan:  MSSA bacteremia (2 of 2 blood cx)  No clear source - ID following and directing antibiotic choice/length of use - TEE has been arranged per Cards  Low back/hip pain no acute findings on MRI - begin PT/OT and follow   Hypokalemia Cont to supplement to goal of 4.0  Hypomagnesemia  Supplement and follow   HTN BP reasonably controlled - follow w/o change today   DM2  CBG reasonably controlled at this time - follow trend   DVT prophylaxis: lovenox  Code Status: FULL CODE Family Communication: no family present at time of exam  Disposition Plan: tele bed   Consultants:  ID  Antimicrobials:  Zosyn 11/4 > 11/5 Vanc 11/4 Ancef 11/5 >  Objective: Blood pressure (!) 136/92, pulse (!) 109, temperature 99.1 F (37.3 C), temperature source Oral, resp. rate 18, height 5\' 2"  (1.575 m), weight 89.8 kg (198 lb), SpO2 96 %.  Intake/Output Summary (Last 24 hours) at 08/22/2017 1435 Last data filed at 08/22/2017 0934 Gross per 24 hour  Intake 100 ml  Output -  Net 100 ml   Filed Weights   08/20/17 1616 08/20/17 1619 08/21/17 0500  Weight: 89.8 kg (198 lb) 89.8 kg (198 lb) 89.8 kg (198 lb)    Examination: General: No acute respiratory distress Lungs: Clear to auscultation bilaterally without  wheezes or crackles Cardiovascular: Regular rate and rhythm without murmur gallop or rub normal S1 and S2 Abdomen: Nontender, nondistended, soft, bowel sounds positive, no rebound, no ascites, no appreciable mass Extremities: No significant cyanosis, clubbing, or edema bilateral lower extremities   CBC: Recent Labs  Lab 08/20/17 1640 08/22/17 0444  WBC 11.4* 6.8  NEUTROABS 10.1*  --   HGB 12.7 10.0*  HCT 37.7 30.2*  MCV 83.2 82.3  PLT 155 578*   Basic Metabolic Panel: Recent Labs  Lab 08/20/17 1640 08/22/17 0444  NA 137 137  K 2.8* 3.4*  CL 100* 110  CO2 27 21*  GLUCOSE 212* 112*  BUN 12 13  CREATININE 0.98 1.09*  CALCIUM 8.7* 7.4*  MG  --  1.4*   GFR: Estimated Creatinine Clearance: 61.5 mL/min (A) (by C-G formula based on SCr of 1.09 mg/dL (H)).  Liver Function Tests: Recent Labs  Lab 08/20/17 1640 08/22/17 0444  AST 17 24  ALT 13* 15  ALKPHOS 77 69  BILITOT 0.8 0.5  PROT 7.3 5.6*  ALBUMIN 3.5 2.3*   Recent Labs  Lab 08/20/17 1640  LIPASE 24    HbA1C: Hgb A1c MFr Bld  Date/Time Value Ref Range Status  05/02/2017 03:02 PM 9.7 (H) 4.6 - 6.5 % Final    Comment:    Glycemic Control Guidelines  for People with Diabetes:Non Diabetic:  <6%Goal of Therapy: <7%Additional Action Suggested:  >8%   11/10/2016 05:24 PM 9.7 (H) <5.7 % Final    Comment:      For someone without known diabetes, a hemoglobin A1c value of 6.5% or greater indicates that they may have diabetes and this should be confirmed with a follow-up test.   For someone with known diabetes, a value <7% indicates that their diabetes is well controlled and a value greater than or equal to 7% indicates suboptimal control. A1c targets should be individualized based on duration of diabetes, age, comorbid conditions, and other considerations.   Currently, no consensus exists for use of hemoglobin A1c for diagnosis of diabetes for children.       CBG: Recent Labs  Lab 08/21/17 1137  08/21/17 1649 08/21/17 2120 08/22/17 0741 08/22/17 1206  GLUCAP 159* 132* 109* 131* 77    Recent Results (from the past 240 hour(s))  Blood Culture (routine x 2)     Status: Abnormal (Preliminary result)   Collection Time: 08/20/17  5:03 PM  Result Value Ref Range Status   Specimen Description BLOOD RIGHT ANTECUBITAL  Final   Special Requests   Final    BOTTLES DRAWN AEROBIC AND ANAEROBIC Blood Culture adequate volume   Culture  Setup Time   Final    GRAM POSITIVE COCCI IN CLUSTERS IN BOTH AEROBIC AND ANAEROBIC BOTTLES CRITICAL RESULT CALLED TO, READ BACK BY AND VERIFIED WITH: A. JOHNSTON PHARMD, AT 0748 08/21/17 BY D. VANHOOK    Culture (A)  Final    STAPHYLOCOCCUS AUREUS SUSCEPTIBILITIES TO FOLLOW    Report Status PENDING  Incomplete  Blood Culture ID Panel (Reflexed)     Status: Abnormal   Collection Time: 08/20/17  5:03 PM  Result Value Ref Range Status   Enterococcus species NOT DETECTED NOT DETECTED Final   Listeria monocytogenes NOT DETECTED NOT DETECTED Final   Staphylococcus species DETECTED (A) NOT DETECTED Final    Comment: CRITICAL RESULT CALLED TO, READ BACK BY AND VERIFIED WITH: A. JOHNSTON PHARMD, AT 5993 08/21/17 BY D. VANHOOK    Staphylococcus aureus DETECTED (A) NOT DETECTED Final    Comment: Methicillin (oxacillin) susceptible Staphylococcus aureus (MSSA). Preferred therapy is anti staphylococcal beta lactam antibiotic (Cefazolin or Nafcillin), unless clinically contraindicated. CRITICAL RESULT CALLED TO, READ BACK BY AND VERIFIED WITH: A. JOHNSTON PHARMD, AT 5701 08/21/17 BY D. VANHOOK    Methicillin resistance NOT DETECTED NOT DETECTED Final   Streptococcus species NOT DETECTED NOT DETECTED Final   Streptococcus agalactiae NOT DETECTED NOT DETECTED Final   Streptococcus pneumoniae NOT DETECTED NOT DETECTED Final   Streptococcus pyogenes NOT DETECTED NOT DETECTED Final   Acinetobacter baumannii NOT DETECTED NOT DETECTED Final   Enterobacteriaceae species  NOT DETECTED NOT DETECTED Final   Enterobacter cloacae complex NOT DETECTED NOT DETECTED Final   Escherichia coli NOT DETECTED NOT DETECTED Final   Klebsiella oxytoca NOT DETECTED NOT DETECTED Final   Klebsiella pneumoniae NOT DETECTED NOT DETECTED Final   Proteus species NOT DETECTED NOT DETECTED Final   Serratia marcescens NOT DETECTED NOT DETECTED Final   Haemophilus influenzae NOT DETECTED NOT DETECTED Final   Neisseria meningitidis NOT DETECTED NOT DETECTED Final   Pseudomonas aeruginosa NOT DETECTED NOT DETECTED Final   Candida albicans NOT DETECTED NOT DETECTED Final   Candida glabrata NOT DETECTED NOT DETECTED Final   Candida krusei NOT DETECTED NOT DETECTED Final   Candida parapsilosis NOT DETECTED NOT DETECTED Final   Candida tropicalis NOT  DETECTED NOT DETECTED Final  Blood Culture (routine x 2)     Status: Abnormal (Preliminary result)   Collection Time: 08/20/17  5:05 PM  Result Value Ref Range Status   Specimen Description BLOOD RIGHT HAND  Final   Special Requests   Final    BOTTLES DRAWN AEROBIC ONLY Blood Culture adequate volume   Culture  Setup Time   Final    GRAM POSITIVE COCCI IN CLUSTERS AEROBIC BOTTLE ONLY CRITICAL VALUE NOTED.  VALUE IS CONSISTENT WITH PREVIOUSLY REPORTED AND CALLED VALUE.    Culture STAPHYLOCOCCUS AUREUS (A)  Final   Report Status PENDING  Incomplete  Urine culture     Status: None   Collection Time: 08/20/17  6:53 PM  Result Value Ref Range Status   Specimen Description URINE, CATHETERIZED  Final   Special Requests NONE  Final   Culture NO GROWTH  Final   Report Status 08/21/2017 FINAL  Final     Scheduled Meds: . cloNIDine  0.2 mg Oral BID  . enoxaparin (LOVENOX) injection  40 mg Subcutaneous Q24H  . insulin aspart  0-15 Units Subcutaneous TID WC  . pantoprazole  40 mg Oral Daily   Continuous Infusions: . sodium chloride 1,000 mL (08/22/17 0153)  .  ceFAZolin (ANCEF) IV Stopped (08/22/17 1013)     LOS: 1 day     [name], MD [facility] Office  [phone] Pager - Text Page per Amion as per below:  On-Call/Text Page:      [email or web address]      password [email or web address]  If 7PM-7AM, please contact night-coverage [email or web address] Password [email or web address] 08/22/2017, 2:35 PM

## 2017-08-22 NOTE — Progress Notes (Signed)
  [place] TEAM 1 - Stepdown/ICU TEAM  FYI - Cardiology has been contacted and has placed the pt on the schedule for a TEE on Thursday of this week.  [name], MD [facility] Office  [phone] Pager - Text Page per Amion as per below:  On-Call/Text Page:      [email or web address]      password [email or web address]  If 7PM-7AM, please contact night-coverage [email or web address] Password [facility] 08/22/2017, 5:54 PM

## 2017-08-22 NOTE — Progress Notes (Signed)
[facility] Infusion Coordinator will follow pt with ID team to support home infusion pharmacy services at [place] as ordered.  If patient discharges after hours, please call [phone].   Larry Sierras 08/22/2017, 1:52 PM

## 2017-08-22 NOTE — Plan of Care (Signed)
  Progressing Clinical Measurements: Diagnostic test results will improve 08/22/2017 1207 - Progressing by Marice Potter, RN Coping: Level of anxiety will decrease 08/22/2017 1207 - Progressing by Marice Potter, RN

## 2017-08-23 ENCOUNTER — Encounter (HOSPITAL_COMMUNITY): Payer: Self-pay | Admitting: Cardiology

## 2017-08-23 DIAGNOSIS — R7881 Bacteremia: Secondary | ICD-10-CM

## 2017-08-23 DIAGNOSIS — E118 Type 2 diabetes mellitus with unspecified complications: Secondary | ICD-10-CM

## 2017-08-23 DIAGNOSIS — J9601 Acute respiratory failure with hypoxia: Secondary | ICD-10-CM

## 2017-08-23 LAB — CULTURE, BLOOD (ROUTINE X 2)
Special Requests: ADEQUATE
Special Requests: ADEQUATE

## 2017-08-23 LAB — BASIC METABOLIC PANEL
Anion gap: 6 (ref 5–15)
BUN: 9 mg/dL (ref 6–20)
CO2: 22 mmol/L (ref 22–32)
Calcium: 7.8 mg/dL — ABNORMAL LOW (ref 8.9–10.3)
Chloride: 106 mmol/L (ref 101–111)
Creatinine, Ser: 0.96 mg/dL (ref 0.44–1.00)
GFR calc Af Amer: >60 mL/min (ref >60)
GFR calc non Af Amer: >60 mL/min (ref >60)
Glucose, Bld: 132 mg/dL — ABNORMAL HIGH (ref 65–99)
Potassium: 3.8 mmol/L (ref 3.5–5.1)
Sodium: 134 mmol/L — ABNORMAL LOW (ref 135–145)

## 2017-08-23 LAB — CBC
HCT: 30.3 % — ABNORMAL LOW (ref 36.0–46.0)
Hemoglobin: 10.2 g/dL — ABNORMAL LOW (ref 12.0–15.0)
MCH: 27.1 pg (ref 26.0–34.0)
MCHC: 33.7 g/dL (ref 30.0–36.0)
MCV: 80.6 fL (ref 78.0–100.0)
Platelets: 136 10*3/uL — ABNORMAL LOW (ref 150–400)
RBC: 3.76 MIL/uL — ABNORMAL LOW (ref 3.87–5.11)
RDW: 14.7 % (ref 11.5–15.5)
WBC: 5.8 10*3/uL (ref 4.0–10.5)

## 2017-08-23 LAB — GLUCOSE, CAPILLARY
Glucose-Capillary: 123 mg/dL — ABNORMAL HIGH (ref 65–99)
Glucose-Capillary: 150 mg/dL — ABNORMAL HIGH (ref 65–99)
Glucose-Capillary: 156 mg/dL — ABNORMAL HIGH (ref 65–99)
Glucose-Capillary: 152 mg/dL — ABNORMAL HIGH (ref 65–99)
Glucose-Capillary: 113 mg/dL — ABNORMAL HIGH (ref 65–99)

## 2017-08-23 MED ORDER — HYDRALAZINE HCL 20 MG/ML IJ SOLN
5.0000 mg | INTRAMUSCULAR | Status: DC | PRN
  Administered 2017-08-23 – 2017-08-27 (×4): 5 mg via INTRAVENOUS
  Filled 2017-08-23 (×3): qty 1

## 2017-08-23 MED ORDER — SODIUM CHLORIDE 0.9 % IV SOLN
1000.0000 mL | INTRAVENOUS | Status: DC
  Administered 2017-08-23 – 2017-08-24 (×2): 1000 mL via INTRAVENOUS

## 2017-08-23 MED ORDER — SODIUM CHLORIDE 0.9% FLUSH: 10.0000 mL | INTRAVENOUS | Status: DC | PRN

## 2017-08-23 NOTE — Plan of Care (Signed)
  Pain Managment: General experience of comfort will improve 08/23/2017 2323 - Progressing by Jerene Bears, RN 08/23/2017 2322 - Progressing by Jerene Bears, RN   Education: Knowledge of General Education information will improve 08/23/2017 2323 - Progressing by Jerene Bears, RN 08/23/2017 2322 - Progressing by Jerene Bears, RN

## 2017-08-23 NOTE — Evaluation (Signed)
Occupational Therapy Evaluation Patient Details Name: Heather Walker MRN: [id number] DOB: 23-Dec-1960 Today's Date: 08/23/2017    History of Present Illness Heather Walker a 56 y.o.femalewith medical history significant ofDM2, HTN. Patient presents to the ED with c/o severe lower back and R hip pain. MRI negative however pt with sepsis. Pt with planned TEE for thursday to rule out endocarditis.   Clinical Impression   Patient presenting with increased pain, decreased I in self care, balance, functional transfers/mobility, endurance, strength, and safety awareness. Patient reports being independent PTA. Patient currently functioning at min A level with functional transfers and needing mod - max A for LB self care secondary to pain. Patient will benefit from acute OT to increase overall independence in the areas of ADLs, functional mobility,and safety in order to safely discharge home with family once medically cleared.     Follow Up Recommendations  Home health OT;Supervision/Assistance - 24 hour    Equipment Recommendations  3 in 1 bedside commode    Recommendations for Other Services       Precautions / Restrictions Precautions Precautions: Fall Precaution Comments: pain Restrictions Weight Bearing Restrictions: No      Mobility Bed Mobility Overal bed mobility: Needs Assistance Bed Mobility: Sit to Supine   Sidelying to sit: Min assist   Sit to supine: Mod assist   General bed mobility comments: Pt required assistance with lifting B LE's into bed secondary to increased pain in back and R hip   Transfers Overall transfer level: Needs assistance Equipment used: Rolling walker (2 wheeled) Transfers: Sit to/from Stand Sit to Stand: Min assist         General transfer comment: v/c's for safe hand placement, minA for initial power up and to remain steady during transition of hands from bed to RW    Balance Overall balance assessment: Needs  assistance Sitting-balance support: Feet supported;Bilateral upper extremity supported Sitting balance-Leahy Scale: Fair Sitting balance - Comments: braced self with bilat UE support due to increased back pain from sitting. pt unable to don socks due to inability to bend over or bring foot to knee   Standing balance support: Bilateral upper extremity supported Standing balance-Leahy Scale: Poor Standing balance comment: requires UE support        ADL either performed or assessed with clinical judgement   ADL Overall ADL's : Needs assistance/impaired Eating/Feeding: Modified independent   Grooming: Set up;Sitting   Upper Body Bathing: Set up;Supervision/ safety;Sitting   Lower Body Bathing: Maximal assistance;Cueing for safety;Sit to/from stand   Upper Body Dressing : Minimal assistance;Sitting   Lower Body Dressing: Maximal assistance;Sit to/from stand   Toilet Transfer: Minimal assistance;Comfort height toilet;Ambulation;RW Toilet Transfer Details (indicate cue type and reason): min A for standing from elevated BSC with RW Toileting- Clothing Manipulation and Hygiene: Min guard;Sit to/from stand Toileting - Clothing Manipulation Details (indicate cue type and reason): min guard for balance with clothing management. Hygiene performed from seated position on toilet     Functional mobility during ADLs: Min guard;Rolling walker;Cueing for safety General ADL Comments: increased pain of 10/10 with all functional tasks                  Pertinent Vitals/Pain Pain Assessment: 0-10 Pain Score: 10-Worst pain ever Pain Location: R hip and back Pain Descriptors / Indicators: Sharp;Guarding;Discomfort Pain Intervention(s): Premedicated before session;Monitored during session;Limited activity within patient's tolerance     Hand Dominance Right   Extremity/Trunk Assessment Upper Extremity Assessment Upper Extremity Assessment: Overall WFL for  tasks assessed   Lower Extremity  Assessment Lower Extremity Assessment: Generalized weakness   Cervical / Trunk Assessment Cervical / Trunk Assessment: (back pain)   Communication Communication Communication: No difficulties   Cognition Arousal/Alertness: Awake/alert Behavior During Therapy: WFL for tasks assessed/performed Overall Cognitive Status: Within Functional Limits for tasks assessed           General Comments  Pt received seated on toilet with min A for safe transfer            Home Living Family/patient expects to be discharged to:: Private residence Living Arrangements: Spouse/significant other Available Help at Discharge: Family;Available 24 hours/day Type of Home: House Home Access: Stairs to enter [employer] of Steps: 3 Entrance Stairs-Rails: Can reach both Home Layout: One level     Bathroom Shower/Tub: Tub/shower unit;Curtain   Biochemist, clinical: Standard     Home Equipment: Cane - single point;Tub bench;Electric scooter          Prior Functioning/Environment Level of Independence: Independent        Comments: uses cane typically for ambulation, hasn't used electric scooter in a year, until yesterday due to severe pain        OT Problem List: Decreased strength;Impaired balance (sitting and/or standing);Pain;Decreased safety awareness;Decreased knowledge of use of DME or AE;Decreased coordination;Decreased activity tolerance      OT Treatment/Interventions: Self-care/ADL training;Manual therapy;Therapeutic exercise;Patient/family education;Balance training;Energy conservation;Therapeutic activities;DME and/or AE instruction    OT Goals(Current goals can be found in the care plan section) Acute Rehab OT Goals Patient Stated Goal: decrease pain OT Goal Formulation: With patient Time For Goal Achievement: 09/06/17 Potential to Achieve Goals: Good ADL Goals Pt Will Perform Grooming: with supervision Pt Will Perform Upper Body Bathing: with  supervision;standing Pt Will Perform Lower Body Bathing: with supervision;sit to/from stand;with adaptive equipment Pt Will Perform Upper Body Dressing: sitting;with supervision Pt Will Perform Lower Body Dressing: with supervision;with adaptive equipment;sit to/from stand Pt Will Transfer to Toilet: with supervision;ambulating Pt Will Perform Toileting - Clothing Manipulation and hygiene: with supervision;sit to/from stand Pt Will Perform Tub/Shower Transfer: with min guard assist;ambulating;tub bench;rolling walker  OT Frequency: Min 2X/week   Barriers to D/C:    none known at this time          AM-PAC PT "6 Clicks" Daily Activity     Outcome Measure Help from another person eating meals?: None Help from another person taking care of personal grooming?: None Help from another person toileting, which includes using toliet, bedpan, or urinal?: A Little Help from another person bathing (including washing, rinsing, drying)?: A Lot Help from another person to put on and taking off regular upper body clothing?: A Little Help from another person to put on and taking off regular lower body clothing?: A Lot 6 Click Score: 18   End of Session Equipment Utilized During Treatment: Rolling walker  Activity Tolerance: Patient limited by fatigue;Patient limited by pain Patient left: in bed;with call bell/phone within reach  OT Visit Diagnosis: Unsteadiness on feet (R26.81);Muscle weakness (generalized) (M62.81)                Time: 1009-1030 OT Time Calculation (min): 21 min Charges:  OT General Charges $OT Visit: 1 Visit OT Evaluation $OT Eval Low Complexity: 1 Low G-Codes:       Elma Limas P, MS, OTR/L, CBIS 08/23/2017, 10:46 AM

## 2017-08-23 NOTE — Progress Notes (Signed)
PHARMACY - PHYSICIAN COMMUNICATION CRITICAL VALUE ALERT - BLOOD CULTURE IDENTIFICATION (BCID)  Results for orders placed or performed during the hospital encounter of 08/20/17  Blood Culture ID Panel (Reflexed) (Collected: 08/20/2017  5:03 PM)  Result Value Ref Range   Enterococcus species NOT DETECTED NOT DETECTED   Listeria monocytogenes NOT DETECTED NOT DETECTED   Staphylococcus species DETECTED (A) NOT DETECTED   Staphylococcus aureus DETECTED (A) NOT DETECTED   Methicillin resistance NOT DETECTED NOT DETECTED   Streptococcus species NOT DETECTED NOT DETECTED   Streptococcus agalactiae NOT DETECTED NOT DETECTED   Streptococcus pneumoniae NOT DETECTED NOT DETECTED   Streptococcus pyogenes NOT DETECTED NOT DETECTED   Acinetobacter baumannii NOT DETECTED NOT DETECTED   Enterobacteriaceae species NOT DETECTED NOT DETECTED   Enterobacter cloacae complex NOT DETECTED NOT DETECTED   Escherichia coli NOT DETECTED NOT DETECTED   Klebsiella oxytoca NOT DETECTED NOT DETECTED   Klebsiella pneumoniae NOT DETECTED NOT DETECTED   Proteus species NOT DETECTED NOT DETECTED   Serratia marcescens NOT DETECTED NOT DETECTED   Haemophilus influenzae NOT DETECTED NOT DETECTED   Neisseria meningitidis NOT DETECTED NOT DETECTED   Pseudomonas aeruginosa NOT DETECTED NOT DETECTED   Candida albicans NOT DETECTED NOT DETECTED   Candida glabrata NOT DETECTED NOT DETECTED   Candida krusei NOT DETECTED NOT DETECTED   Candida parapsilosis NOT DETECTED NOT DETECTED   Candida tropicalis NOT DETECTED NOT DETECTED    Name of physician (or Provider) Contacted: Dr. Hal Hope ([facility])  Changes to prescribed antibiotics required: Cont Ancef  Narda Bonds 08/23/2017  12:08 AM

## 2017-08-23 NOTE — Progress Notes (Signed)
[facility] for [facility]  Date of Admission:  08/20/2017     Total days of antibiotics 4 Ancef 11/5 >> Vancomycin 11/4 >> 11/5 Zosyn 11/4 >> 11/5         ASSESSMENT/PLAN  MSSA bacteremia - Continues to have occasional fever. Repeat blood cultures positive for gram positive cocci. TEE scheduled for tomorrow with otherwise unknown source. May require additional scans if TEE is negative. Continue Ancef. Will likely need PICC and 4 weeks of antibiotic therapy pending TEE results.   Hip/Back - No source of infection identified for bacteremia. Agree with PT/OT. Will continue to monitor.   Therapeutic drug monitoring - Liver and kidney function stable with current dosage of Ancef. Continue to monitor.    Principal Problem:   Sepsis ([place]) Active Problems:   Essential hypertension   Diabetes mellitus type II, uncontrolled ([place])   Acute right hip pain   . cloNIDine  0.2 mg Oral BID  . enoxaparin (LOVENOX) injection  40 mg Subcutaneous Q24H  . insulin aspart  0-15 Units Subcutaneous TID WC  . pantoprazole  40 mg Oral Daily    SUBJECTIVE: Did have a temp of 102.9 this morning treated with acetaminophen. WBC of 5.8. Repeat blood cultures positive for gram positive cocci. Continues to have back and hip pain.   Allergies  Allergen Reactions  . Atorvastatin Other (See Comments)    Neck stiffness  . Codeine Itching   Review of Systems: Review of Systems  Constitutional: Positive for fever.  Respiratory: Negative for cough.   Cardiovascular: Negative for chest pain and palpitations.  Skin: Negative for rash.  Neurological: Negative for weakness.    OBJECTIVE: Vitals:   08/23/17 0447 08/23/17 0623 08/23/17 0930 08/23/17 0936  BP:  (!) 136/93 (!) 168/118 (!) 159/106  Pulse: (!) 111 (!) 108 98   Resp: (!) 24  15   Temp:  98.3 F (36.8 C) 98.5 F (36.9 C)   TempSrc:  Oral Oral   SpO2: 97%  94%   Weight:      Height:       Body mass index is 36.21  kg/m.  Physical Exam  Constitutional: She is well-developed, well-nourished, and in no distress. No distress.  Cardiovascular: Regular rhythm, normal heart sounds and intact distal pulses.  No extrasystoles are present. Tachycardia present. Exam reveals no gallop.  No murmur heard. Pulmonary/Chest: Effort normal. She has wheezes.    Lab Results Lab Results  Component Value Date   WBC 5.8 08/23/2017   HGB 10.2 (L) 08/23/2017   HCT 30.3 (L) 08/23/2017   MCV 80.6 08/23/2017   PLT 136 (L) 08/23/2017    Lab Results  Component Value Date   CREATININE 0.96 08/23/2017   BUN 9 08/23/2017   NA 134 (L) 08/23/2017   K 3.8 08/23/2017   CL 106 08/23/2017   CO2 22 08/23/2017    Lab Results  Component Value Date   ALT 15 08/22/2017   AST 24 08/22/2017   ALKPHOS 69 08/22/2017   BILITOT 0.5 08/22/2017     Microbiology: Recent Results (from the past 240 hour(s))  Blood Culture (routine x 2)     Status: Abnormal   Collection Time: 08/20/17  5:03 PM  Result Value Ref Range Status   Specimen Description BLOOD RIGHT ANTECUBITAL  Final   Special Requests   Final    BOTTLES DRAWN AEROBIC AND ANAEROBIC Blood Culture adequate volume   Culture  Setup Time   Final  GRAM POSITIVE COCCI IN CLUSTERS IN BOTH AEROBIC AND ANAEROBIC BOTTLES CRITICAL RESULT CALLED TO, READ BACK BY AND VERIFIED WITH: A. JOHNSTON PHARMD, AT 0748 08/21/17 BY D. VANHOOK    Culture STAPHYLOCOCCUS AUREUS (A)  Final   Report Status 08/23/2017 FINAL  Final   Organism ID, Bacteria STAPHYLOCOCCUS AUREUS  Final      Susceptibility   Staphylococcus aureus - MIC*    CIPROFLOXACIN <=0.5 SENSITIVE Sensitive     ERYTHROMYCIN >=8 RESISTANT Resistant     GENTAMICIN <=0.5 SENSITIVE Sensitive     OXACILLIN <=0.25 SENSITIVE Sensitive     TETRACYCLINE <=1 SENSITIVE Sensitive     VANCOMYCIN <=0.5 SENSITIVE Sensitive     TRIMETH/SULFA <=10 SENSITIVE Sensitive     CLINDAMYCIN <=0.25 SENSITIVE Sensitive     RIFAMPIN <=0.5 SENSITIVE  Sensitive     Inducible Clindamycin NEGATIVE Sensitive     * STAPHYLOCOCCUS AUREUS  Blood Culture ID Panel (Reflexed)     Status: Abnormal   Collection Time: 08/20/17  5:03 PM  Result Value Ref Range Status   Enterococcus species NOT DETECTED NOT DETECTED Final   Listeria monocytogenes NOT DETECTED NOT DETECTED Final   Staphylococcus species DETECTED (A) NOT DETECTED Final    Comment: CRITICAL RESULT CALLED TO, READ BACK BY AND VERIFIED WITH: A. JOHNSTON PHARMD, AT 5035 08/21/17 BY D. VANHOOK    Staphylococcus aureus DETECTED (A) NOT DETECTED Final    Comment: Methicillin (oxacillin) susceptible Staphylococcus aureus (MSSA). Preferred therapy is anti staphylococcal beta lactam antibiotic (Cefazolin or Nafcillin), unless clinically contraindicated. CRITICAL RESULT CALLED TO, READ BACK BY AND VERIFIED WITH: A. JOHNSTON PHARMD, AT 4656 08/21/17 BY D. VANHOOK    Methicillin resistance NOT DETECTED NOT DETECTED Final   Streptococcus species NOT DETECTED NOT DETECTED Final   Streptococcus agalactiae NOT DETECTED NOT DETECTED Final   Streptococcus pneumoniae NOT DETECTED NOT DETECTED Final   Streptococcus pyogenes NOT DETECTED NOT DETECTED Final   Acinetobacter baumannii NOT DETECTED NOT DETECTED Final   Enterobacteriaceae species NOT DETECTED NOT DETECTED Final   Enterobacter cloacae complex NOT DETECTED NOT DETECTED Final   Escherichia coli NOT DETECTED NOT DETECTED Final   Klebsiella oxytoca NOT DETECTED NOT DETECTED Final   Klebsiella pneumoniae NOT DETECTED NOT DETECTED Final   Proteus species NOT DETECTED NOT DETECTED Final   Serratia marcescens NOT DETECTED NOT DETECTED Final   Haemophilus influenzae NOT DETECTED NOT DETECTED Final   Neisseria meningitidis NOT DETECTED NOT DETECTED Final   Pseudomonas aeruginosa NOT DETECTED NOT DETECTED Final   Candida albicans NOT DETECTED NOT DETECTED Final   Candida glabrata NOT DETECTED NOT DETECTED Final   Candida krusei NOT DETECTED NOT  DETECTED Final   Candida parapsilosis NOT DETECTED NOT DETECTED Final   Candida tropicalis NOT DETECTED NOT DETECTED Final  Blood Culture (routine x 2)     Status: Abnormal   Collection Time: 08/20/17  5:05 PM  Result Value Ref Range Status   Specimen Description BLOOD RIGHT HAND  Final   Special Requests   Final    BOTTLES DRAWN AEROBIC ONLY Blood Culture adequate volume   Culture  Setup Time   Final    GRAM POSITIVE COCCI IN CLUSTERS AEROBIC BOTTLE ONLY CRITICAL VALUE NOTED.  VALUE IS CONSISTENT WITH PREVIOUSLY REPORTED AND CALLED VALUE.    Culture (A)  Final    STAPHYLOCOCCUS AUREUS SUSCEPTIBILITIES PERFORMED ON PREVIOUS CULTURE WITHIN THE LAST 5 DAYS.    Report Status 08/23/2017 FINAL  Final  Urine culture  Status: None   Collection Time: 08/20/17  6:53 PM  Result Value Ref Range Status   Specimen Description URINE, CATHETERIZED  Final   Special Requests NONE  Final   Culture NO GROWTH  Final   Report Status 08/21/2017 FINAL  Final  Culture, blood (routine x 2)     Status: None (Preliminary result)   Collection Time: 08/22/17  4:45 AM  Result Value Ref Range Status   Specimen Description BLOOD LEFT HAND  Final   Special Requests IN PEDIATRIC BOTTLE Blood Culture adequate volume  Final   Culture  Setup Time   Final    GRAM POSITIVE COCCI IN CLUSTERS IN PEDIATRIC BOTTLE CRITICAL RESULT CALLED TO, READ BACK BY AND VERIFIED WITH: J.LEDFORD PHARMD 08/22/17 2343 L.CHAMPION    Culture   Final    GRAM POSITIVE COCCI IN CLUSTERS CULTURE REINCUBATED FOR BETTER GROWTH    Report Status PENDING  Incomplete  Culture, blood (routine x 2)     Status: None (Preliminary result)   Collection Time: 08/22/17  4:50 AM  Result Value Ref Range Status   Specimen Description BLOOD RIGHT HAND  Final   Special Requests IN PEDIATRIC BOTTLE Blood Culture adequate volume  Final   Culture  Setup Time   Final    GRAM POSITIVE COCCI IN CLUSTERS IN PEDIATRIC BOTTLE CRITICAL VALUE NOTED.  VALUE IS  CONSISTENT WITH PREVIOUSLY REPORTED AND CALLED VALUE.    Culture GRAM POSITIVE COCCI  Final   Report Status PENDING  Incomplete     Terri Piedra, NP [facility] [phone] Pager [phone] Cell  08/23/2017  10:56 AM

## 2017-08-23 NOTE — Progress Notes (Signed)
PROGRESS NOTE    Heather Walker  ZOX:[id number] DOB: Feb 17, 1961 DOA: 08/20/2017 PCP: [name], DO   Brief Narrative:  56 y.o. BF PMHx DM Type 2 without complicationand HTN, Heart Murmur, Arthritis    Presented to the ED with c/o severe persistent low back and RIGHThip pain for 2-3 days, worse with any movement or palpation or walking.  Per EMS she was screaming in pain on arrival.  MRI of the L spine and R hip in the ED were unremarkable.  No overlying skin lesions were noted on exam.       Subjective: 11/7 A/O 4, negative CP, positive SOB, negative abdominal pain, negative N/V. States not on home O2. Not on home CPAP. Tmax last 24 hours 39.4C    Assessment & Plan:   Principal Problem:   Sepsis ([place]) Active Problems:   Essential hypertension   Diabetes mellitus type II, uncontrolled ([place])   Acute right hip pain  Acute respiratory failure with hypoxia -Titrate O2 to maintain SPO2 > 93% -ABG in the a.m.   OSA/OHS? -ABG in a.m.  MSSA Bacteremia -resource. -TEE per cardiology on 11/8 -Antibiotics per ID. Will need to complete at least 2 weeks antibiotics if TEE clear  Low back pain/hip pain -No acute findings on MRI  -Patiently morbidly obese: Await PT/OT recommendations patient probably will require some rehabilitation.    Hypokalemia Cont to supplement to goal of 4.0   Essential HTN  -Clonidine 0.2 mg BID -Hydralazine PRN -  Diabetes Type 2 uncontrolled with complication -7/7 Hemoglobin A1c= 9.7 -Moderate SSI   DVT prophylaxis: Lovenox Code Status: Full Family Communication: Spoke with son over the phone explain plan of care Disposition Plan: TBD   Consultants:  ID Cardiology  Procedures/Significant Events:     I have personally reviewed and interpreted all radiology studies and my findings are as above.  VENTILATOR SETTINGS:    Cultures   Antimicrobials: Anti-infectives (From admission, onward)   Start     Stop   08/22/17  1945  ceFAZolin (ANCEF) IVPB 2g/100 mL premix         08/21/17 1700  ceFAZolin (ANCEF) IVPB 2g/100 mL premix  Status:  Discontinued     08/22/17 1933   08/21/17 1500  ceFAZolin (ANCEF) IVPB 2g/100 mL premix  Status:  Discontinued     08/21/17 1443   08/21/17 0400  vancomycin (VANCOCIN) IVPB 1000 mg/200 mL premix  Status:  Discontinued     08/21/17 1430   08/21/17 0000  piperacillin-tazobactam (ZOSYN) IVPB 3.375 g  Status:  Discontinued     08/21/17 1430   08/20/17 1645  piperacillin-tazobactam (ZOSYN) IVPB 3.375 g     08/20/17 1735   08/20/17 1645  vancomycin (VANCOCIN) IVPB 1000 mg/200 mL premix     08/20/17 1805       Devices    LINES / TUBES:      Continuous Infusions: . sodium chloride 1,000 mL (08/22/17 1518)  .  ceFAZolin (ANCEF) IV 2 g (08/23/17 1254)     Objective: Vitals:   08/23/17 1142 08/23/17 1149 08/23/17 1307 08/23/17 1330  BP: (!) 147/96  (!) 142/93 (!) 133/92  Pulse: (!) 103  98 91  Resp: 18   15  Temp:   98.6 F (37 C) 98.7 F (37.1 C)  TempSrc:   Oral Oral  SpO2: 97% 99% 100% 100%  Weight:      Height:        Intake/Output Summary (Last 24 hours) at  08/23/2017 1730 Last data filed at 08/23/2017 1433 Gross per 24 hour  Intake 240 ml  Output 875 ml  Net -635 ml   Filed Weights   08/20/17 1616 08/20/17 1619 08/21/17 0500  Weight: 198 lb (89.8 kg) 198 lb (89.8 kg) 198 lb (89.8 kg)    Examination:  General: A/O 4, positive  acute respiratory distress Neck:  Negative scars, masses, torticollis, lymphadenopathy, JVD Lungs: Clear to auscultation bilaterally without wheezes or crackles Cardiovascular: Tachycardic, Regular rhythm without murmur gallop or rub normal S1 and S2 Abdomen: MORBIDLY OBESE, negative abdominal pain, nondistended, positive soft, bowel sounds, no rebound, no ascites, no appreciable mass Extremities: No significant cyanosis, clubbing, or edema bilateral lower extremities Skin: Negative rashes, lesions,  ulcers Psychiatric:  Negative depression, negative anxiety, negative fatigue, negative mania  Central nervous system:  Cranial nerves II through XII intact, tongue/uvula midline, all extremities muscle strength 5/5, sensation intact throughout, negative dysarthria, negative expressive aphasia, negative receptive aphasia.  .     Data Reviewed: Care during the described time interval was provided by me .  I have reviewed this patient's available data, including medical history, events of note, physical examination, and all test results as part of my evaluation.   CBC: Recent Labs  Lab 08/20/17 1640 08/22/17 0444 08/23/17 0410  WBC 11.4* 6.8 5.8  NEUTROABS 10.1*  --   --   HGB 12.7 10.0* 10.2*  HCT 37.7 30.2* 30.3*  MCV 83.2 82.3 80.6  PLT 155 110* 270*   Basic Metabolic Panel: Recent Labs  Lab 08/20/17 1640 08/22/17 0444 08/23/17 0410  NA 137 137 134*  K 2.8* 3.4* 3.8  CL 100* 110 106  CO2 27 21* 22  GLUCOSE 212* 112* 132*  BUN 12 13 9   CREATININE 0.98 1.09* 0.96  CALCIUM 8.7* 7.4* 7.8*  MG  --  1.4*  --    GFR: Estimated Creatinine Clearance: 69.8 mL/min (by C-G formula based on SCr of 0.96 mg/dL). Liver Function Tests: Recent Labs  Lab 08/20/17 1640 08/22/17 0444  AST 17 24  ALT 13* 15  ALKPHOS 77 69  BILITOT 0.8 0.5  PROT 7.3 5.6*  ALBUMIN 3.5 2.3*   Recent Labs  Lab 08/20/17 1640  LIPASE 24   No results for input(s): AMMONIA in the last 168 hours. Coagulation Profile: No results for input(s): INR, PROTIME in the last 168 hours. Cardiac Enzymes: No results for input(s): CKTOTAL, CKMB, CKMBINDEX, TROPONINI in the last 168 hours. BNP (last 3 results) No results for input(s): PROBNP in the last 8760 hours. HbA1C: No results for input(s): HGBA1C in the last 72 hours. CBG: Recent Labs  Lab 08/22/17 2010 08/23/17 0831 08/23/17 1152 08/23/17 1208 08/23/17 1728  GLUCAP 94 123* 156* 150* 152*   Lipid Profile: No results for input(s): CHOL, HDL,  LDLCALC, TRIG, CHOLHDL, LDLDIRECT in the last 72 hours. Thyroid Function Tests: No results for input(s): TSH, T4TOTAL, FREET4, T3FREE, THYROIDAB in the last 72 hours. Anemia Panel: No results for input(s): VITAMINB12, FOLATE, FERRITIN, TIBC, IRON, RETICCTPCT in the last 72 hours. Urine analysis:    Component Value Date/Time   COLORURINE YELLOW 08/20/2017 1841   APPEARANCEUR CLEAR 08/20/2017 1841   LABSPEC 1.010 08/20/2017 1841   PHURINE 6.0 08/20/2017 1841   GLUCOSEU >=500 (A) 08/20/2017 1841   HGBUR NEGATIVE 08/20/2017 1841   HGBUR negative 09/03/2007 1610   BILIRUBINUR NEGATIVE 08/20/2017 1841   BILIRUBINUR neg 05/09/2016 [place] 08/20/2017 1841   PROTEINUR NEGATIVE 08/20/2017 1841  UROBILINOGEN 0.2 05/09/2016 1324   UROBILINOGEN 1.0 02/09/2014 1257   NITRITE NEGATIVE 08/20/2017 1841   LEUKOCYTESUR NEGATIVE 08/20/2017 1841   Sepsis Labs: @LABRCNTIP (procalcitonin:4,lacticidven:4)  ) Recent Results (from the past 240 hour(s))  Blood Culture (routine x 2)     Status: Abnormal   Collection Time: 08/20/17  5:03 PM  Result Value Ref Range Status   Specimen Description BLOOD RIGHT ANTECUBITAL  Final   Special Requests   Final    BOTTLES DRAWN AEROBIC AND ANAEROBIC Blood Culture adequate volume   Culture  Setup Time   Final    GRAM POSITIVE COCCI IN CLUSTERS IN BOTH AEROBIC AND ANAEROBIC BOTTLES CRITICAL RESULT CALLED TO, READ BACK BY AND VERIFIED WITH: A. JOHNSTON PHARMD, AT 0814 08/21/17 BY D. VANHOOK    Culture STAPHYLOCOCCUS AUREUS (A)  Final   Report Status 08/23/2017 FINAL  Final   Organism ID, Bacteria STAPHYLOCOCCUS AUREUS  Final      Susceptibility   Staphylococcus aureus - MIC*    CIPROFLOXACIN <=0.5 SENSITIVE Sensitive     ERYTHROMYCIN >=8 RESISTANT Resistant     GENTAMICIN <=0.5 SENSITIVE Sensitive     OXACILLIN <=0.25 SENSITIVE Sensitive     TETRACYCLINE <=1 SENSITIVE Sensitive     VANCOMYCIN <=0.5 SENSITIVE Sensitive     TRIMETH/SULFA <=10  SENSITIVE Sensitive     CLINDAMYCIN <=0.25 SENSITIVE Sensitive     RIFAMPIN <=0.5 SENSITIVE Sensitive     Inducible Clindamycin NEGATIVE Sensitive     * STAPHYLOCOCCUS AUREUS  Blood Culture ID Panel (Reflexed)     Status: Abnormal   Collection Time: 08/20/17  5:03 PM  Result Value Ref Range Status   Enterococcus species NOT DETECTED NOT DETECTED Final   Listeria monocytogenes NOT DETECTED NOT DETECTED Final   Staphylococcus species DETECTED (A) NOT DETECTED Final    Comment: CRITICAL RESULT CALLED TO, READ BACK BY AND VERIFIED WITH: A. JOHNSTON PHARMD, AT 4818 08/21/17 BY D. VANHOOK    Staphylococcus aureus DETECTED (A) NOT DETECTED Final    Comment: Methicillin (oxacillin) susceptible Staphylococcus aureus (MSSA). Preferred therapy is anti staphylococcal beta lactam antibiotic (Cefazolin or Nafcillin), unless clinically contraindicated. CRITICAL RESULT CALLED TO, READ BACK BY AND VERIFIED WITH: A. JOHNSTON PHARMD, AT 5631 08/21/17 BY D. VANHOOK    Methicillin resistance NOT DETECTED NOT DETECTED Final   Streptococcus species NOT DETECTED NOT DETECTED Final   Streptococcus agalactiae NOT DETECTED NOT DETECTED Final   Streptococcus pneumoniae NOT DETECTED NOT DETECTED Final   Streptococcus pyogenes NOT DETECTED NOT DETECTED Final   Acinetobacter baumannii NOT DETECTED NOT DETECTED Final   Enterobacteriaceae species NOT DETECTED NOT DETECTED Final   Enterobacter cloacae complex NOT DETECTED NOT DETECTED Final   Escherichia coli NOT DETECTED NOT DETECTED Final   Klebsiella oxytoca NOT DETECTED NOT DETECTED Final   Klebsiella pneumoniae NOT DETECTED NOT DETECTED Final   Proteus species NOT DETECTED NOT DETECTED Final   Serratia marcescens NOT DETECTED NOT DETECTED Final   Haemophilus influenzae NOT DETECTED NOT DETECTED Final   Neisseria meningitidis NOT DETECTED NOT DETECTED Final   Pseudomonas aeruginosa NOT DETECTED NOT DETECTED Final   Candida albicans NOT DETECTED NOT DETECTED Final    Candida glabrata NOT DETECTED NOT DETECTED Final   Candida krusei NOT DETECTED NOT DETECTED Final   Candida parapsilosis NOT DETECTED NOT DETECTED Final   Candida tropicalis NOT DETECTED NOT DETECTED Final  Blood Culture (routine x 2)     Status: Abnormal   Collection Time: 08/20/17  5:05 PM  Result  Value Ref Range Status   Specimen Description BLOOD RIGHT HAND  Final   Special Requests   Final    BOTTLES DRAWN AEROBIC ONLY Blood Culture adequate volume   Culture  Setup Time   Final    GRAM POSITIVE COCCI IN CLUSTERS AEROBIC BOTTLE ONLY CRITICAL VALUE NOTED.  VALUE IS CONSISTENT WITH PREVIOUSLY REPORTED AND CALLED VALUE.    Culture (A)  Final    STAPHYLOCOCCUS AUREUS SUSCEPTIBILITIES PERFORMED ON PREVIOUS CULTURE WITHIN THE LAST 5 DAYS.    Report Status 08/23/2017 FINAL  Final  Urine culture     Status: None   Collection Time: 08/20/17  6:53 PM  Result Value Ref Range Status   Specimen Description URINE, CATHETERIZED  Final   Special Requests NONE  Final   Culture NO GROWTH  Final   Report Status 08/21/2017 FINAL  Final  Culture, blood (routine x 2)     Status: None (Preliminary result)   Collection Time: 08/22/17  4:45 AM  Result Value Ref Range Status   Specimen Description BLOOD LEFT HAND  Final   Special Requests IN PEDIATRIC BOTTLE Blood Culture adequate volume  Final   Culture  Setup Time   Final    GRAM POSITIVE COCCI IN CLUSTERS IN PEDIATRIC BOTTLE CRITICAL RESULT CALLED TO, READ BACK BY AND VERIFIED WITH: J.LEDFORD PHARMD 08/22/17 2343 L.CHAMPION    Culture   Final    GRAM POSITIVE COCCI IN CLUSTERS CULTURE REINCUBATED FOR BETTER GROWTH    Report Status PENDING  Incomplete  Culture, blood (routine x 2)     Status: None (Preliminary result)   Collection Time: 08/22/17  4:50 AM  Result Value Ref Range Status   Specimen Description BLOOD RIGHT HAND  Final   Special Requests IN PEDIATRIC BOTTLE Blood Culture adequate volume  Final   Culture  Setup Time   Final     GRAM POSITIVE COCCI IN CLUSTERS IN PEDIATRIC BOTTLE CRITICAL VALUE NOTED.  VALUE IS CONSISTENT WITH PREVIOUSLY REPORTED AND CALLED VALUE.    Culture GRAM POSITIVE COCCI  Final   Report Status PENDING  Incomplete         Radiology Studies: No results found.      Scheduled Meds: . cloNIDine  0.2 mg Oral BID  . enoxaparin (LOVENOX) injection  40 mg Subcutaneous Q24H  . insulin aspart  0-15 Units Subcutaneous TID WC  . pantoprazole  40 mg Oral Daily   Continuous Infusions: . sodium chloride 1,000 mL (08/22/17 1518)  .  ceFAZolin (ANCEF) IV 2 g (08/23/17 1254)     LOS: 2 days    Time spent: 40 minutes    Deondray Ospina, Geraldo Docker, MD [facility] Pager [phone]   If 7PM-7AM, please contact night-coverage [email or web address] Password [email or web address] 08/23/2017, 5:30 PM

## 2017-08-23 NOTE — H&P (View-Only) (Signed)
PROGRESS NOTE    Heather Walker  RXV:[id number] DOB: [birth year] DOA: 08/20/2017 PCP: [name], DO   Brief Narrative:  56 y.o. BF PMHx DM Type 2 without complicationand HTN, Heart Murmur, Arthritis    Presented to the ED with c/o severe persistent low back and RIGHThip pain for 2-3 days, worse with any movement or palpation or walking.  Per EMS she was screaming in pain on arrival.  MRI of the L spine and R hip in the ED were unremarkable.  No overlying skin lesions were noted on exam.       Subjective: 11/7 A/O 4, negative CP, positive SOB, negative abdominal pain, negative N/V. States not on home O2. Not on home CPAP. Tmax last 24 hours 39.4C    Assessment & Plan:   Principal Problem:   Sepsis ([place]) Active Problems:   Essential hypertension   Diabetes mellitus type II, uncontrolled ([place])   Acute right hip pain  Acute respiratory failure with hypoxia -Titrate O2 to maintain SPO2 > 93% -ABG in the a.m.   OSA/OHS? -ABG in a.m.  MSSA Bacteremia -resource. -TEE per cardiology on 11/8 -Antibiotics per ID. Will need to complete at least 2 weeks antibiotics if TEE clear  Low back pain/hip pain -No acute findings on MRI  -Patiently morbidly obese: Await PT/OT recommendations patient probably will require some rehabilitation.    Hypokalemia Cont to supplement to goal of 4.0   Essential HTN  -Clonidine 0.2 mg BID -Hydralazine PRN -  Diabetes Type 2 uncontrolled with complication -7/7 Hemoglobin A1c= 9.7 -Moderate SSI   DVT prophylaxis: Lovenox Code Status: Full Family Communication: Spoke with son over the phone explain plan of care Disposition Plan: TBD   Consultants:  ID Cardiology  Procedures/Significant Events:     I have personally reviewed and interpreted all radiology studies and my findings are as above.  VENTILATOR SETTINGS:    Cultures   Antimicrobials: Anti-infectives (From admission, onward)   Start     Stop   08/22/17  1945  ceFAZolin (ANCEF) IVPB 2g/100 mL premix         08/21/17 1700  ceFAZolin (ANCEF) IVPB 2g/100 mL premix  Status:  Discontinued     08/22/17 1933   08/21/17 1500  ceFAZolin (ANCEF) IVPB 2g/100 mL premix  Status:  Discontinued     08/21/17 1443   08/21/17 0400  vancomycin (VANCOCIN) IVPB 1000 mg/200 mL premix  Status:  Discontinued     08/21/17 1430   08/21/17 0000  piperacillin-tazobactam (ZOSYN) IVPB 3.375 g  Status:  Discontinued     08/21/17 1430   08/20/17 1645  piperacillin-tazobactam (ZOSYN) IVPB 3.375 g     08/20/17 1735   08/20/17 1645  vancomycin (VANCOCIN) IVPB 1000 mg/200 mL premix     08/20/17 1805       Devices    LINES / TUBES:      Continuous Infusions: . sodium chloride 1,000 mL (08/22/17 1518)  .  ceFAZolin (ANCEF) IV 2 g (08/23/17 1254)     Objective: Vitals:   08/23/17 1142 08/23/17 1149 08/23/17 1307 08/23/17 1330  BP: (!) 147/96  (!) 142/93 (!) 133/92  Pulse: (!) 103  98 91  Resp: 18   15  Temp:   98.6 F (37 C) 98.7 F (37.1 C)  TempSrc:   Oral Oral  SpO2: 97% 99% 100% 100%  Weight:      Height:        Intake/Output Summary (Last 24 hours) at  08/23/2017 1730 Last data filed at 08/23/2017 1433 Gross per 24 hour  Intake 240 ml  Output 875 ml  Net -635 ml   Filed Weights   08/20/17 1616 08/20/17 1619 08/21/17 0500  Weight: 198 lb (89.8 kg) 198 lb (89.8 kg) 198 lb (89.8 kg)    Examination:  General: A/O 4, positive  acute respiratory distress Neck:  Negative scars, masses, torticollis, lymphadenopathy, JVD Lungs: Clear to auscultation bilaterally without wheezes or crackles Cardiovascular: Tachycardic, Regular rhythm without murmur gallop or rub normal S1 and S2 Abdomen: MORBIDLY OBESE, negative abdominal pain, nondistended, positive soft, bowel sounds, no rebound, no ascites, no appreciable mass Extremities: No significant cyanosis, clubbing, or edema bilateral lower extremities Skin: Negative rashes, lesions,  ulcers Psychiatric:  Negative depression, negative anxiety, negative fatigue, negative mania  Central nervous system:  Cranial nerves II through XII intact, tongue/uvula midline, all extremities muscle strength 5/5, sensation intact throughout, negative dysarthria, negative expressive aphasia, negative receptive aphasia.  .     Data Reviewed: Care during the described time interval was provided by me .  I have reviewed this patient's available data, including medical history, events of note, physical examination, and all test results as part of my evaluation.   CBC: Recent Labs  Lab 08/20/17 1640 08/22/17 0444 08/23/17 0410  WBC 11.4* 6.8 5.8  NEUTROABS 10.1*  --   --   HGB 12.7 10.0* 10.2*  HCT 37.7 30.2* 30.3*  MCV 83.2 82.3 80.6  PLT 155 110* 749*   Basic Metabolic Panel: Recent Labs  Lab 08/20/17 1640 08/22/17 0444 08/23/17 0410  NA 137 137 134*  K 2.8* 3.4* 3.8  CL 100* 110 106  CO2 27 21* 22  GLUCOSE 212* 112* 132*  BUN 12 13 9   CREATININE 0.98 1.09* 0.96  CALCIUM 8.7* 7.4* 7.8*  MG  --  1.4*  --    GFR: Estimated Creatinine Clearance: 69.8 mL/min (by C-G formula based on SCr of 0.96 mg/dL). Liver Function Tests: Recent Labs  Lab 08/20/17 1640 08/22/17 0444  AST 17 24  ALT 13* 15  ALKPHOS 77 69  BILITOT 0.8 0.5  PROT 7.3 5.6*  ALBUMIN 3.5 2.3*   Recent Labs  Lab 08/20/17 1640  LIPASE 24   No results for input(s): AMMONIA in the last 168 hours. Coagulation Profile: No results for input(s): INR, PROTIME in the last 168 hours. Cardiac Enzymes: No results for input(s): CKTOTAL, CKMB, CKMBINDEX, TROPONINI in the last 168 hours. BNP (last 3 results) No results for input(s): PROBNP in the last 8760 hours. HbA1C: No results for input(s): HGBA1C in the last 72 hours. CBG: Recent Labs  Lab 08/22/17 2010 08/23/17 0831 08/23/17 1152 08/23/17 1208 08/23/17 1728  GLUCAP 94 123* 156* 150* 152*   Lipid Profile: No results for input(s): CHOL, HDL,  LDLCALC, TRIG, CHOLHDL, LDLDIRECT in the last 72 hours. Thyroid Function Tests: No results for input(s): TSH, T4TOTAL, FREET4, T3FREE, THYROIDAB in the last 72 hours. Anemia Panel: No results for input(s): VITAMINB12, FOLATE, FERRITIN, TIBC, IRON, RETICCTPCT in the last 72 hours. Urine analysis:    Component Value Date/Time   COLORURINE YELLOW 08/20/2017 1841   APPEARANCEUR CLEAR 08/20/2017 1841   LABSPEC 1.010 08/20/2017 1841   PHURINE 6.0 08/20/2017 1841   GLUCOSEU >=500 (A) 08/20/2017 1841   HGBUR NEGATIVE 08/20/2017 1841   HGBUR negative 09/03/2007 1610   BILIRUBINUR NEGATIVE 08/20/2017 1841   BILIRUBINUR neg 05/09/2016 [place] 08/20/2017 1841   PROTEINUR NEGATIVE 08/20/2017 1841  UROBILINOGEN 0.2 05/09/2016 1324   UROBILINOGEN 1.0 02/09/2014 1257   NITRITE NEGATIVE 08/20/2017 1841   LEUKOCYTESUR NEGATIVE 08/20/2017 1841   Sepsis Labs: @LABRCNTIP (procalcitonin:4,lacticidven:4)  ) Recent Results (from the past 240 hour(s))  Blood Culture (routine x 2)     Status: Abnormal   Collection Time: 08/20/17  5:03 PM  Result Value Ref Range Status   Specimen Description BLOOD RIGHT ANTECUBITAL  Final   Special Requests   Final    BOTTLES DRAWN AEROBIC AND ANAEROBIC Blood Culture adequate volume   Culture  Setup Time   Final    GRAM POSITIVE COCCI IN CLUSTERS IN BOTH AEROBIC AND ANAEROBIC BOTTLES CRITICAL RESULT CALLED TO, READ BACK BY AND VERIFIED WITH: A. JOHNSTON PHARMD, AT 1517 08/21/17 BY D. VANHOOK    Culture STAPHYLOCOCCUS AUREUS (A)  Final   Report Status 08/23/2017 FINAL  Final   Organism ID, Bacteria STAPHYLOCOCCUS AUREUS  Final      Susceptibility   Staphylococcus aureus - MIC*    CIPROFLOXACIN <=0.5 SENSITIVE Sensitive     ERYTHROMYCIN >=8 RESISTANT Resistant     GENTAMICIN <=0.5 SENSITIVE Sensitive     OXACILLIN <=0.25 SENSITIVE Sensitive     TETRACYCLINE <=1 SENSITIVE Sensitive     VANCOMYCIN <=0.5 SENSITIVE Sensitive     TRIMETH/SULFA <=10  SENSITIVE Sensitive     CLINDAMYCIN <=0.25 SENSITIVE Sensitive     RIFAMPIN <=0.5 SENSITIVE Sensitive     Inducible Clindamycin NEGATIVE Sensitive     * STAPHYLOCOCCUS AUREUS  Blood Culture ID Panel (Reflexed)     Status: Abnormal   Collection Time: 08/20/17  5:03 PM  Result Value Ref Range Status   Enterococcus species NOT DETECTED NOT DETECTED Final   Listeria monocytogenes NOT DETECTED NOT DETECTED Final   Staphylococcus species DETECTED (A) NOT DETECTED Final    Comment: CRITICAL RESULT CALLED TO, READ BACK BY AND VERIFIED WITH: A. JOHNSTON PHARMD, AT 6160 08/21/17 BY D. VANHOOK    Staphylococcus aureus DETECTED (A) NOT DETECTED Final    Comment: Methicillin (oxacillin) susceptible Staphylococcus aureus (MSSA). Preferred therapy is anti staphylococcal beta lactam antibiotic (Cefazolin or Nafcillin), unless clinically contraindicated. CRITICAL RESULT CALLED TO, READ BACK BY AND VERIFIED WITH: A. JOHNSTON PHARMD, AT 7371 08/21/17 BY D. VANHOOK    Methicillin resistance NOT DETECTED NOT DETECTED Final   Streptococcus species NOT DETECTED NOT DETECTED Final   Streptococcus agalactiae NOT DETECTED NOT DETECTED Final   Streptococcus pneumoniae NOT DETECTED NOT DETECTED Final   Streptococcus pyogenes NOT DETECTED NOT DETECTED Final   Acinetobacter baumannii NOT DETECTED NOT DETECTED Final   Enterobacteriaceae species NOT DETECTED NOT DETECTED Final   Enterobacter cloacae complex NOT DETECTED NOT DETECTED Final   Escherichia coli NOT DETECTED NOT DETECTED Final   Klebsiella oxytoca NOT DETECTED NOT DETECTED Final   Klebsiella pneumoniae NOT DETECTED NOT DETECTED Final   Proteus species NOT DETECTED NOT DETECTED Final   Serratia marcescens NOT DETECTED NOT DETECTED Final   Haemophilus influenzae NOT DETECTED NOT DETECTED Final   Neisseria meningitidis NOT DETECTED NOT DETECTED Final   Pseudomonas aeruginosa NOT DETECTED NOT DETECTED Final   Candida albicans NOT DETECTED NOT DETECTED Final    Candida glabrata NOT DETECTED NOT DETECTED Final   Candida krusei NOT DETECTED NOT DETECTED Final   Candida parapsilosis NOT DETECTED NOT DETECTED Final   Candida tropicalis NOT DETECTED NOT DETECTED Final  Blood Culture (routine x 2)     Status: Abnormal   Collection Time: 08/20/17  5:05 PM  Result  Value Ref Range Status   Specimen Description BLOOD RIGHT HAND  Final   Special Requests   Final    BOTTLES DRAWN AEROBIC ONLY Blood Culture adequate volume   Culture  Setup Time   Final    GRAM POSITIVE COCCI IN CLUSTERS AEROBIC BOTTLE ONLY CRITICAL VALUE NOTED.  VALUE IS CONSISTENT WITH PREVIOUSLY REPORTED AND CALLED VALUE.    Culture (A)  Final    STAPHYLOCOCCUS AUREUS SUSCEPTIBILITIES PERFORMED ON PREVIOUS CULTURE WITHIN THE LAST 5 DAYS.    Report Status 08/23/2017 FINAL  Final  Urine culture     Status: None   Collection Time: 08/20/17  6:53 PM  Result Value Ref Range Status   Specimen Description URINE, CATHETERIZED  Final   Special Requests NONE  Final   Culture NO GROWTH  Final   Report Status 08/21/2017 FINAL  Final  Culture, blood (routine x 2)     Status: None (Preliminary result)   Collection Time: 08/22/17  4:45 AM  Result Value Ref Range Status   Specimen Description BLOOD LEFT HAND  Final   Special Requests IN PEDIATRIC BOTTLE Blood Culture adequate volume  Final   Culture  Setup Time   Final    GRAM POSITIVE COCCI IN CLUSTERS IN PEDIATRIC BOTTLE CRITICAL RESULT CALLED TO, READ BACK BY AND VERIFIED WITH: J.LEDFORD PHARMD 08/22/17 2343 L.CHAMPION    Culture   Final    GRAM POSITIVE COCCI IN CLUSTERS CULTURE REINCUBATED FOR BETTER GROWTH    Report Status PENDING  Incomplete  Culture, blood (routine x 2)     Status: None (Preliminary result)   Collection Time: 08/22/17  4:50 AM  Result Value Ref Range Status   Specimen Description BLOOD RIGHT HAND  Final   Special Requests IN PEDIATRIC BOTTLE Blood Culture adequate volume  Final   Culture  Setup Time   Final     GRAM POSITIVE COCCI IN CLUSTERS IN PEDIATRIC BOTTLE CRITICAL VALUE NOTED.  VALUE IS CONSISTENT WITH PREVIOUSLY REPORTED AND CALLED VALUE.    Culture GRAM POSITIVE COCCI  Final   Report Status PENDING  Incomplete         Radiology Studies: No results found.      Scheduled Meds: . cloNIDine  0.2 mg Oral BID  . enoxaparin (LOVENOX) injection  40 mg Subcutaneous Q24H  . insulin aspart  0-15 Units Subcutaneous TID WC  . pantoprazole  40 mg Oral Daily   Continuous Infusions: . sodium chloride 1,000 mL (08/22/17 1518)  .  ceFAZolin (ANCEF) IV 2 g (08/23/17 1254)     LOS: 2 days    Time spent: 40 minutes    Renie Stelmach, Geraldo Docker, MD [facility] Pager [phone]   If 7PM-7AM, please contact night-coverage [email or web address] Password [email or web address] 08/23/2017, 5:30 PM

## 2017-08-23 NOTE — Progress Notes (Signed)
Pt's temp in am was 102.9 orally and BP 166/101-MD notified and order obtained for 5mg  IV hydralazine q4h prn SBP>160. 5 mg IV hydralazine given and PO tylenol given. Will monitor effect.  *Temp recheck 98.3 and BP 136/93. Will ctm and assess.

## 2017-08-23 NOTE — Evaluation (Signed)
Physical Therapy Evaluation Patient Details Name: Heather Walker MRN: [id number] DOB: 08/16/1961 Today's Date: 08/23/2017   History of Present Illness  Heather Walker a 56 y.o.femalewith medical history significant ofDM2, HTN. Patient presents to the ED with c/o severe lower back and R hip pain. MRI negative however pt with sepsis. Pt with planned TEE for thursday to rule out endocarditis.  Clinical Impression  Pt with 10/10 back/ R hip pain greatly limiting mobility. Pt was indep PTA. Anticipate once pain under control pt will progress well with RW and be safe to d/c home with spouse once medically cleared. Acute PT to con't to follow to progress indep with mobility.    Follow Up Recommendations No PT follow up;Supervision - Intermittent    Equipment Recommendations  Rolling walker with 5" wheels;3in1 (PT)    Recommendations for Other Services       Precautions / Restrictions Precautions Precautions: Fall Precaution Comments: pain Restrictions Weight Bearing Restrictions: No(pt self  RLE WBIng limited due to pain)      Mobility  Bed Mobility Overal bed mobility: Needs Assistance Bed Mobility: Sidelying to Sit   Sidelying to sit: Min assist       General bed mobility comments: pt used bed rail and was able to bring LEs off EOB with increased time but required minA for trunk elevation due to pain  Transfers Overall transfer level: Needs assistance Equipment used: Rolling walker (2 wheeled) Transfers: Sit to/from Stand Sit to Stand: Min assist         General transfer comment: v/c's for safe hand placement, minA for initial power up and to remain steady during transition of hands from bed to RW  Ambulation/Gait Ambulation/Gait assistance: Min assist Ambulation Distance (Feet): 15 Feet(to bathroom) Assistive device: Rolling walker (2 wheeled) Gait Pattern/deviations: Step-through pattern;Decreased stride length;Antalgic;Decreased stance time - right Gait  velocity: slow Gait velocity interpretation: Below normal speed for age/gender General Gait Details: pt very dependent on UEs due to low back pain and R LE, pt unsteady due to pain requiring minA for walker management around obstacles  Stairs            Wheelchair Mobility    Modified Rankin (Stroke Patients Only)       Balance   Sitting-balance support: Feet supported;Bilateral upper extremity supported Sitting balance-Leahy Scale: Fair Sitting balance - Comments: braced self with bilat UE support due to increased back pain from sitting. pt unable to don socks due to inability to bend over or bring foot to knee   Standing balance support: Bilateral upper extremity supported Standing balance-Leahy Scale: Poor Standing balance comment: requires UE support                             Pertinent Vitals/Pain Pain Assessment: 0-10 Pain Score: 10-Worst pain ever Pain Location: R hip and back Pain Descriptors / Indicators: Sharp Pain Intervention(s): Premedicated before session    Home Living Family/patient expects to be discharged to:: Private residence Living Arrangements: Spouse/significant other Available Help at Discharge: Family;Available 24 hours/day Type of Home: House Home Access: Stairs to enter Entrance Stairs-Rails: Can reach both Entrance Stairs-Number of Steps: 3 Home Layout: One level Home Equipment: Cane - single point;Tub bench;Electric scooter      Prior Function Level of Independence: Independent         Comments: uses cane typically for ambulation, hasn't used electric scooter in a year, until yesterday due to severe pain  Hand Dominance   Dominant Hand: Right    Extremity/Trunk Assessment   Upper Extremity Assessment Upper Extremity Assessment: Overall WFL for tasks assessed    Lower Extremity Assessment Lower Extremity Assessment: Generalized weakness(due to pain, limited ROM due to pain, NO MMT due to pain)     Cervical / Trunk Assessment Cervical / Trunk Assessment: (back pain)  Communication   Communication: No difficulties  Cognition Arousal/Alertness: Awake/alert Behavior During Therapy: WFL for tasks assessed/performed Overall Cognitive Status: Within Functional Limits for tasks assessed                                        General Comments General comments (skin integrity, edema, etc.): assist pt to the bathroom, supervision for transfer    Exercises     Assessment/Plan    PT Assessment Patient needs continued PT services  PT Problem List Decreased strength;Decreased range of motion;Decreased activity tolerance;Decreased balance;Decreased mobility;Decreased coordination;Decreased knowledge of use of DME;Pain       PT Treatment Interventions DME instruction;Gait training;Therapeutic activities;Therapeutic exercise;Balance training;Functional mobility training;Stair training    PT Goals (Current goals can be found in the Care Plan section)  Acute Rehab PT Goals Patient Stated Goal: stop the pain PT Goal Formulation: With patient Time For Goal Achievement: 09/06/17 Potential to Achieve Goals: Good    Frequency Min 4X/week   Barriers to discharge        Co-evaluation               AM-PAC PT "6 Clicks" Daily Activity  Outcome Measure Difficulty turning over in bed (including adjusting bedclothes, sheets and blankets)?: Unable Difficulty moving from lying on back to sitting on the side of the bed? : Unable Difficulty sitting down on and standing up from a chair with arms (e.g., wheelchair, bedside commode, etc,.)?: Unable Help needed moving to and from a bed to chair (including a wheelchair)?: A Little Help needed walking in hospital room?: A Little Help needed climbing 3-5 steps with a railing? : A Lot 6 Click Score: 11    End of Session Equipment Utilized During Treatment: Gait belt Activity Tolerance: Patient limited by pain Patient left:  (on commode with OT) Nurse Communication: Mobility status PT Visit Diagnosis: Difficulty in walking, not elsewhere classified (R26.2);Pain Pain - Right/Left: Right Pain - part of body: Hip(back)    Time: 2683-4196 PT Time Calculation (min) (ACUTE ONLY): 17 min   Charges:   PT Evaluation $PT Eval Moderate Complexity: 1 Mod     PT G Codes:        Heather Walker, PT, DPT Pager #: [phone] Office #: [phone]   Heather Walker 08/23/2017, 10:19 AM

## 2017-08-23 NOTE — Progress Notes (Signed)
    [facility] has been requested to perform a transesophageal echocardiogram on Heather Walker for bacteremia.  After careful review of history and examination, the risks and benefits of transesophageal echocardiogram have been explained including risks of esophageal damage, perforation (1:10,000 risk), bleeding, pharyngeal hematoma as well as other potential complications associated with conscious sedation including aspiration, arrhythmia, respiratory failure and death. Alternatives to treatment were discussed, questions were answered. Patient is willing to proceed.  Pt was sleepy but I kept her awake and she did agree to procedure.  Cecilie Kicks, NP  08/23/2017 4:33 PM

## 2017-08-24 ENCOUNTER — Ambulatory Visit: Payer: Medicare Other | Admitting: Internal Medicine

## 2017-08-24 ENCOUNTER — Encounter (HOSPITAL_COMMUNITY)
Admission: EM | Disposition: A | Discharge status: Skilled Nursing Facility | Payer: Self-pay | Source: Home / Self Care | Attending: Internal Medicine

## 2017-08-24 ENCOUNTER — Encounter (HOSPITAL_COMMUNITY): Payer: Self-pay | Admitting: *Deleted

## 2017-08-24 ENCOUNTER — Inpatient Hospital Stay (HOSPITAL_COMMUNITY): Payer: Medicare Other

## 2017-08-24 DIAGNOSIS — R7881 Bacteremia: Secondary | ICD-10-CM

## 2017-08-24 DIAGNOSIS — Z0289 Encounter for other administrative examinations: Secondary | ICD-10-CM

## 2017-08-24 HISTORY — PX: TEE WITHOUT CARDIOVERSION: SHX5443

## 2017-08-24 LAB — BASIC METABOLIC PANEL
Anion gap: 7 (ref 5–15)
BUN: 6 mg/dL (ref 6–20)
CO2: 24 mmol/L (ref 22–32)
Calcium: 8 mg/dL — ABNORMAL LOW (ref 8.9–10.3)
Chloride: 106 mmol/L (ref 101–111)
Creatinine, Ser: 0.84 mg/dL (ref 0.44–1.00)
GFR calc Af Amer: >60 mL/min (ref >60)
GFR calc non Af Amer: >60 mL/min (ref >60)
Glucose, Bld: 118 mg/dL — ABNORMAL HIGH (ref 65–99)
Potassium: 3.6 mmol/L (ref 3.5–5.1)
Sodium: 137 mmol/L (ref 135–145)

## 2017-08-24 LAB — PROTIME-INR
INR: 1.05
Prothrombin Time: 13.7 seconds (ref 11.4–15.2)

## 2017-08-24 LAB — BLOOD GAS, ARTERIAL
Acid-base deficit: 1 mmol/L (ref 0.0–2.0)
Bicarbonate: 22.7 mmol/L (ref 20.0–28.0)
Drawn by: 575321
O2 Content: 2 L/min
O2 Saturation: 97.9 %
Patient temperature: 98.6
pCO2 arterial: 34.9 mmHg (ref 32.0–48.0)
pH, Arterial: 7.429 (ref 7.350–7.450)
pO2, Arterial: 103 mmHg (ref 83.0–108.0)

## 2017-08-24 LAB — CULTURE, BLOOD (ROUTINE X 2)
Special Requests: ADEQUATE
Special Requests: ADEQUATE

## 2017-08-24 LAB — MAGNESIUM: Magnesium: 1.7 mg/dL (ref 1.7–2.4)

## 2017-08-24 LAB — BRAIN NATRIURETIC PEPTIDE: B Natriuretic Peptide: 201.7 pg/mL — ABNORMAL HIGH (ref 0.0–100.0)

## 2017-08-24 LAB — GLUCOSE, CAPILLARY
Glucose-Capillary: 97 mg/dL (ref 65–99)
Glucose-Capillary: 85 mg/dL (ref 65–99)
Glucose-Capillary: 75 mg/dL (ref 65–99)
Glucose-Capillary: 152 mg/dL — ABNORMAL HIGH (ref 65–99)

## 2017-08-24 LAB — LACTIC ACID, PLASMA: Lactic Acid, Venous: 1 mmol/L (ref 0.5–1.9)

## 2017-08-24 SURGERY — ECHOCARDIOGRAM, TRANSESOPHAGEAL
Anesthesia: Moderate Sedation

## 2017-08-24 MED ORDER — MIDAZOLAM HCL 5 MG/ML IJ SOLN
INTRAMUSCULAR | Status: AC
  Filled 2017-08-24: qty 2

## 2017-08-24 MED ORDER — MIDAZOLAM HCL 10 MG/2ML IJ SOLN
INTRAMUSCULAR | Status: DC | PRN
  Administered 2017-08-24: 1 mg via INTRAVENOUS
  Administered 2017-08-24 (×3): 2 mg via INTRAVENOUS

## 2017-08-24 MED ORDER — BUTAMBEN-TETRACAINE-BENZOCAINE 2-2-14 % EX AERO
INHALATION_SPRAY | CUTANEOUS | Status: DC | PRN
  Administered 2017-08-24: 2 via TOPICAL

## 2017-08-24 MED ORDER — KETOROLAC TROMETHAMINE 30 MG/ML IJ SOLN
INTRAMUSCULAR | Status: AC
  Filled 2017-08-24: qty 1

## 2017-08-24 MED ORDER — FENTANYL CITRATE (PF) 100 MCG/2ML IJ SOLN
INTRAMUSCULAR | Status: DC | PRN
  Administered 2017-08-24 (×2): 25 ug via INTRAVENOUS

## 2017-08-24 MED ORDER — HYDRALAZINE HCL 20 MG/ML IJ SOLN
INTRAMUSCULAR | Status: DC | PRN
  Administered 2017-08-24: 10 mg via INTRAVENOUS

## 2017-08-24 MED ORDER — HYDRALAZINE HCL 20 MG/ML IJ SOLN
INTRAMUSCULAR | Status: AC
  Filled 2017-08-24: qty 1

## 2017-08-24 MED ORDER — FENTANYL CITRATE (PF) 100 MCG/2ML IJ SOLN
INTRAMUSCULAR | Status: AC
  Filled 2017-08-24: qty 2

## 2017-08-24 NOTE — Progress Notes (Signed)
[facility]  Date of Admission:  08/20/2017     Total days of antibiotics 5 Ancef 11/5 >> Vancomycin 11/4 >> 11/5 Zosyn 11/4 >> 11/5          ASSESSMENT/PLAN  MSSA Bacteremia / Sepsis - Continue Ancef. TEE negative for endocarditis. Repeat blood cultures on 11/6 positive for staphylococcus aureas with unknown source of infection with back and hip imaging without evidence of infection. PICC line when blood cultures are cleared. Likely require 4 weeks of IV antibiotics.  Back/Hip pain - Unlikely to be infectious. Continue with pain management and PT/OT.   Principal Problem:   Sepsis ([place]) Active Problems:   Essential hypertension   Diabetes mellitus type II, uncontrolled ([place])   Acute right hip pain   . cloNIDine  0.2 mg Oral BID  . enoxaparin (LOVENOX) injection  40 mg Subcutaneous Q24H  . insulin aspart  0-15 Units Subcutaneous TID WC  . ketorolac      . pantoprazole  40 mg Oral Daily    SUBJECTIVE: Drowsy following procedure and easily arousable. Afebrile overnight. Continues to have back and hip pain. TEE completed without evidence of endocarditis.   Allergies  Allergen Reactions  . Atorvastatin Other (See Comments)    Neck stiffness  . Codeine Itching     Review of Systems: Review of Systems  Constitutional: Negative for chills and fever.  Respiratory: Negative for cough, sputum production and shortness of breath.   Cardiovascular: Negative for chest pain.  Gastrointestinal: Negative for abdominal pain, constipation, nausea and vomiting.  Musculoskeletal: Positive for back pain.  Skin: Negative for rash.      OBJECTIVE: Vitals:   08/24/17 1033 08/24/17 1036 08/24/17 1045 08/24/17 1113  BP: (!) 177/97 (!) 169/74 (!) 171/90 (!) 164/88  Pulse: (!) 102  99 (!) 102  Resp: (!) 27 (!) 36 (!) 26 (!) 22  Temp:  98.7 F (37.1 C)  99.6 F (37.6 C)  TempSrc:  Oral  Oral  SpO2: 100% 100% 100% 99%  Weight:      Height:        Body mass index is 36.21 kg/m.  Physical Exam  Constitutional:  Obese  Cardiovascular: Normal rate, regular rhythm and intact distal pulses.  Murmur heard. Pulmonary/Chest: Effort normal. No respiratory distress. She has wheezes. She has no rales. She exhibits no tenderness.  Abdominal: Soft. She exhibits no distension and no mass. There is no tenderness. There is no rebound and no guarding.    Lab Results Lab Results  Component Value Date   WBC 5.8 08/23/2017   HGB 10.2 (L) 08/23/2017   HCT 30.3 (L) 08/23/2017   MCV 80.6 08/23/2017   PLT 136 (L) 08/23/2017    Lab Results  Component Value Date   CREATININE 0.84 08/24/2017   BUN 6 08/24/2017   NA 137 08/24/2017   K 3.6 08/24/2017   CL 106 08/24/2017   CO2 24 08/24/2017    Lab Results  Component Value Date   ALT 15 08/22/2017   AST 24 08/22/2017   ALKPHOS 69 08/22/2017   BILITOT 0.5 08/22/2017     Microbiology: Recent Results (from the past 240 hour(s))  Blood Culture (routine x 2)     Status: Abnormal   Collection Time: 08/20/17  5:03 PM  Result Value Ref Range Status   Specimen Description BLOOD RIGHT ANTECUBITAL  Final   Special Requests   Final    BOTTLES DRAWN AEROBIC AND ANAEROBIC Blood Culture adequate  volume   Culture  Setup Time   Final    GRAM POSITIVE COCCI IN CLUSTERS IN BOTH AEROBIC AND ANAEROBIC BOTTLES CRITICAL RESULT CALLED TO, READ BACK BY AND VERIFIED WITH: A. JOHNSTON PHARMD, AT 0748 08/21/17 BY D. VANHOOK    Culture STAPHYLOCOCCUS AUREUS (A)  Final   Report Status 08/23/2017 FINAL  Final   Organism ID, Bacteria STAPHYLOCOCCUS AUREUS  Final      Susceptibility   Staphylococcus aureus - MIC*    CIPROFLOXACIN <=0.5 SENSITIVE Sensitive     ERYTHROMYCIN >=8 RESISTANT Resistant     GENTAMICIN <=0.5 SENSITIVE Sensitive     OXACILLIN <=0.25 SENSITIVE Sensitive     TETRACYCLINE <=1 SENSITIVE Sensitive     VANCOMYCIN <=0.5 SENSITIVE Sensitive     TRIMETH/SULFA <=10 SENSITIVE Sensitive      CLINDAMYCIN <=0.25 SENSITIVE Sensitive     RIFAMPIN <=0.5 SENSITIVE Sensitive     Inducible Clindamycin NEGATIVE Sensitive     * STAPHYLOCOCCUS AUREUS  Blood Culture ID Panel (Reflexed)     Status: Abnormal   Collection Time: 08/20/17  5:03 PM  Result Value Ref Range Status   Enterococcus species NOT DETECTED NOT DETECTED Final   Listeria monocytogenes NOT DETECTED NOT DETECTED Final   Staphylococcus species DETECTED (A) NOT DETECTED Final    Comment: CRITICAL RESULT CALLED TO, READ BACK BY AND VERIFIED WITH: A. JOHNSTON PHARMD, AT 7106 08/21/17 BY D. VANHOOK    Staphylococcus aureus DETECTED (A) NOT DETECTED Final    Comment: Methicillin (oxacillin) susceptible Staphylococcus aureus (MSSA). Preferred therapy is anti staphylococcal beta lactam antibiotic (Cefazolin or Nafcillin), unless clinically contraindicated. CRITICAL RESULT CALLED TO, READ BACK BY AND VERIFIED WITH: A. JOHNSTON PHARMD, AT 2694 08/21/17 BY D. VANHOOK    Methicillin resistance NOT DETECTED NOT DETECTED Final   Streptococcus species NOT DETECTED NOT DETECTED Final   Streptococcus agalactiae NOT DETECTED NOT DETECTED Final   Streptococcus pneumoniae NOT DETECTED NOT DETECTED Final   Streptococcus pyogenes NOT DETECTED NOT DETECTED Final   Acinetobacter baumannii NOT DETECTED NOT DETECTED Final   Enterobacteriaceae species NOT DETECTED NOT DETECTED Final   Enterobacter cloacae complex NOT DETECTED NOT DETECTED Final   Escherichia coli NOT DETECTED NOT DETECTED Final   Klebsiella oxytoca NOT DETECTED NOT DETECTED Final   Klebsiella pneumoniae NOT DETECTED NOT DETECTED Final   Proteus species NOT DETECTED NOT DETECTED Final   Serratia marcescens NOT DETECTED NOT DETECTED Final   Haemophilus influenzae NOT DETECTED NOT DETECTED Final   Neisseria meningitidis NOT DETECTED NOT DETECTED Final   Pseudomonas aeruginosa NOT DETECTED NOT DETECTED Final   Candida albicans NOT DETECTED NOT DETECTED Final   Candida glabrata NOT  DETECTED NOT DETECTED Final   Candida krusei NOT DETECTED NOT DETECTED Final   Candida parapsilosis NOT DETECTED NOT DETECTED Final   Candida tropicalis NOT DETECTED NOT DETECTED Final  Blood Culture (routine x 2)     Status: Abnormal   Collection Time: 08/20/17  5:05 PM  Result Value Ref Range Status   Specimen Description BLOOD RIGHT HAND  Final   Special Requests   Final    BOTTLES DRAWN AEROBIC ONLY Blood Culture adequate volume   Culture  Setup Time   Final    GRAM POSITIVE COCCI IN CLUSTERS AEROBIC BOTTLE ONLY CRITICAL VALUE NOTED.  VALUE IS CONSISTENT WITH PREVIOUSLY REPORTED AND CALLED VALUE.    Culture (A)  Final    STAPHYLOCOCCUS AUREUS SUSCEPTIBILITIES PERFORMED ON PREVIOUS CULTURE WITHIN THE LAST 5 DAYS.  Report Status 08/23/2017 FINAL  Final  Urine culture     Status: None   Collection Time: 08/20/17  6:53 PM  Result Value Ref Range Status   Specimen Description URINE, CATHETERIZED  Final   Special Requests NONE  Final   Culture NO GROWTH  Final   Report Status 08/21/2017 FINAL  Final  Culture, blood (routine x 2)     Status: Abnormal   Collection Time: 08/22/17  4:45 AM  Result Value Ref Range Status   Specimen Description BLOOD LEFT HAND  Final   Special Requests IN PEDIATRIC BOTTLE Blood Culture adequate volume  Final   Culture  Setup Time   Final    GRAM POSITIVE COCCI IN CLUSTERS IN PEDIATRIC BOTTLE CRITICAL RESULT CALLED TO, READ BACK BY AND VERIFIED WITH: J.LEDFORD PHARMD 08/22/17 2343 L.CHAMPION    Culture (A)  Final    STAPHYLOCOCCUS AUREUS SUSCEPTIBILITIES PERFORMED ON PREVIOUS CULTURE WITHIN THE LAST 5 DAYS.    Report Status 08/24/2017 FINAL  Final  Culture, blood (routine x 2)     Status: Abnormal   Collection Time: 08/22/17  4:50 AM  Result Value Ref Range Status   Specimen Description BLOOD RIGHT HAND  Final   Special Requests IN PEDIATRIC BOTTLE Blood Culture adequate volume  Final   Culture  Setup Time   Final    GRAM POSITIVE COCCI IN  CLUSTERS IN PEDIATRIC BOTTLE CRITICAL VALUE NOTED.  VALUE IS CONSISTENT WITH PREVIOUSLY REPORTED AND CALLED VALUE.    Culture (A)  Final    STAPHYLOCOCCUS AUREUS SUSCEPTIBILITIES PERFORMED ON PREVIOUS CULTURE WITHIN THE LAST 5 DAYS.    Report Status 08/24/2017 FINAL  Final    Terri Piedra, NP [facility] [phone] Pager  08/24/2017  12:08 PM

## 2017-08-24 NOTE — CV Procedure (Signed)
Procedure: TEE  Indication: MSSA bacteremia  Sedation: Versed 7 mg IV, Fentanyl 50 mcg IV  Findings: Please see echo section for full report.  Normal LV size with moderate LV hypertrophy, EF 60-65%.  Normal wall motion.  Normal right ventricular size and systolic function.  Normal right and left atrial sizes.  No LA appendage thrombus.  No PFO or ASD by color doppler.  Trivial TR with no vegetation.  Normal PV with no vegetation.  Trivial MR, no MV vegetation.  Trileaflet aortic valve with no significant stenosis or regurgitation.  No AoV vegetation.  Normal caliber aorta with minimal plaque.    No evidence for endocarditis.   Heather Walker 08/24/2017 10:30 AM

## 2017-08-24 NOTE — Progress Notes (Addendum)
Echocardiogram Echocardiogram Transesophageal has been performed.  08/24/2017, 10:54 AM

## 2017-08-24 NOTE — Progress Notes (Signed)
[facility]                                                                              Patient Demographics  Heather Walker, is a 56 y.o. female, DOB - February 12, 1961, ATF:[id number]  Admit date - 08/20/2017   Admitting Physician Etta Quill, DO  Outpatient Primary MD for the patient is Carollee Herter, Alferd Apa, DO  Outpatient specialists:   LOS - 3  days   Medical records reviewed and are as summarized below:    Chief Complaint  Patient presents with  . Code Sepsis  . Fever       Brief summary  56 y.o.BF PMHx DM Type 2 without complicationand HTN, Heart Murmur, Arthritis Presented to the ED with c/o severe persistent low back and RIGHThip pain for 2-3 days, worse with any movement or palpation or walking. Per EMS she was screaming in pain on arrival. MRI of the L spine and R hip in the ED were unremarkable. No overlying skin lesions were noted on exam.     Assessment & Plan    Principal Problem:   Sepsis (HCC)/MSSA bacteremia - Unclear source, MRI of the right hip and lumbar spine did not show any discitis, stomatitis or abscess - Blood cultures 11/4 positive for MSSA, repeat 11/6 are also positive for GPC staph - TEE today, per report showed no vegetations - CT abdomen showed no acute intra-abdominal pelvic abnormality - UA negative for UTI -Chest x-ray 11/4 with no active disease -ID following, currently on IV Ancef  Active Problems:   Essential hypertension -BP currently elevated. Restart outpatient medications including Benicar, triamterene HCTZ, continue clonidine    Diabetes mellitus type II, uncontrolled (HCC) -Continue sliding scale insulin.  Hemoglobin A1c 9.7 in 04/2017 - recheck hemoglobin A1c    Acute right hip pain, back pain -Continue pain control, PT OT -No acute findings on the MRI of the lumbar spine and right hip  Obesity - BMI 36, patient recommended diet and weight control   Code Status: Full code DVT Prophylaxis:   Lovenox  Family Communication: Discussed in detail with the patient, all imaging results, lab results explained to the patient    Disposition Plan:   Time Spent in minutes   25 minutes  Procedures:    Consultants:   cardiology ID   Antimicrobials:   IV Ancef   Medications  Scheduled Meds: . [MAR Hold] cloNIDine  0.2 mg Oral BID  . [MAR Hold] enoxaparin (LOVENOX) injection  40 mg Subcutaneous Q24H  . [MAR Hold] insulin aspart  0-15 Units Subcutaneous TID WC  . ketorolac      . [MAR Hold] pantoprazole  40 mg Oral Daily   Continuous Infusions: . [MAR Hold]  ceFAZolin (ANCEF) IV Stopped (08/24/17 0600)   PRN Meds:.[MAR Hold] acetaminophen **OR** [MAR Hold] acetaminophen, [MAR Hold] albuterol, [MAR Hold] cyclobenzaprine, [MAR Hold] hydrALAZINE, [MAR Hold] ketorolac, [MAR Hold] ondansetron **OR** [MAR Hold] ondansetron (ZOFRAN) IV, [MAR Hold] oxyCODONE, [MAR Hold] sodium chloride flush, [MAR Hold] traZODone   Antibiotics   Anti-infectives (From admission, onward)   Start     Dose/Rate Route Frequency Ordered Stop  08/22/17 1945  [MAR Hold]  ceFAZolin (ANCEF) IVPB 2g/100 mL premix     (MAR Hold since 08/24/17 0911)   2 g 200 mL/hr over 30 Minutes Intravenous Every 8 hours 08/22/17 1933     08/21/17 1700  ceFAZolin (ANCEF) IVPB 2g/100 mL premix  Status:  Discontinued     2 g 200 mL/hr over 30 Minutes Intravenous Every 8 hours 08/21/17 1443 08/22/17 1933   08/21/17 1500  ceFAZolin (ANCEF) IVPB 2g/100 mL premix  Status:  Discontinued     2 g 200 mL/hr over 30 Minutes Intravenous Every 8 hours 08/21/17 1439 08/21/17 1443   08/21/17 0400  vancomycin (VANCOCIN) IVPB 1000 mg/200 mL premix  Status:  Discontinued     1,000 mg 200 mL/hr over 60 Minutes Intravenous Every 12 hours 08/20/17 1752 08/21/17 1430   08/21/17 0000  piperacillin-tazobactam (ZOSYN) IVPB 3.375 g  Status:  Discontinued     3.375 g 12.5 mL/hr over 240 Minutes Intravenous Every 8 hours 08/20/17 1752 08/21/17  1430   08/20/17 1645  piperacillin-tazobactam (ZOSYN) IVPB 3.375 g     3.375 g 100 mL/hr over 30 Minutes Intravenous  Once 08/20/17 1640 08/20/17 1735   08/20/17 1645  vancomycin (VANCOCIN) IVPB 1000 mg/200 mL premix     1,000 mg 200 mL/hr over 60 Minutes Intravenous  Once 08/20/17 1640 08/20/17 1805        Subjective:   Thara Searing was seen and examined today.  Groggy but able to answer questions.  Patient denies dizziness, chest pain, shortness of breath, abdominal pain, N/V/D/C, new weakness, numbess, tingling. No acute events overnight.    Objective:   Vitals:   08/24/17 1020 08/24/17 1025 08/24/17 1030 08/24/17 1033  BP: (!) 198/118 (!) 172/90 (!) 172/90 (!) 177/97  Pulse: (!) 103 (!) 108 (!) 102 (!) 102  Resp: (!) 28 20 (!) 23 (!) 27  Temp:      TempSrc:      SpO2: 99% 100% 100% 100%  Weight:      Height:        Intake/Output Summary (Last 24 hours) at 08/24/2017 1036 Last data filed at 08/24/2017 0300 Gross per 24 hour  Intake 2053.33 ml  Output 600 ml  Net 1453.33 ml     Wt Readings from Last 3 Encounters:  08/24/17 89.8 kg (198 lb)  08/20/17 85.7 kg (189 lb)  05/02/17 92.5 kg (204 lb)     Exam  General: Groggy but easily arousable, oriented  Eyes:   HEENT:  Atraumatic, normocephalic  Cardiovascular: S1 S2 auscultated, no rubs, murmurs or gallops. Regular rate and rhythm.  Respiratory: Clear to auscultation bilaterally, no wheezing, rales or rhonchi  Gastrointestinal: Soft, nontender, nondistended, + bowel sounds  Ext: no pedal edema bilaterally  Neuro: Cr N's II- XII. Strength 5/5 upper and lower extremities on left, RLE difficult to lift up due to pain    Musculoskeletal: No digital cyanosis, clubbing  Skin: No rashes  Psych: Groggy but easily arousable, oriented   Data Reviewed:  I have personally reviewed following labs and imaging studies  Micro Results Recent Results (from the past 240 hour(s))  Blood Culture (routine x 2)      Status: Abnormal   Collection Time: 08/20/17  5:03 PM  Result Value Ref Range Status   Specimen Description BLOOD RIGHT ANTECUBITAL  Final   Special Requests   Final    BOTTLES DRAWN AEROBIC AND ANAEROBIC Blood Culture adequate volume   Culture  Setup Time  Final    GRAM POSITIVE COCCI IN CLUSTERS IN BOTH AEROBIC AND ANAEROBIC BOTTLES CRITICAL RESULT CALLED TO, READ BACK BY AND VERIFIED WITH: A. JOHNSTON PHARMD, AT 0748 08/21/17 BY D. VANHOOK    Culture STAPHYLOCOCCUS AUREUS (A)  Final   Report Status 08/23/2017 FINAL  Final   Organism ID, Bacteria STAPHYLOCOCCUS AUREUS  Final      Susceptibility   Staphylococcus aureus - MIC*    CIPROFLOXACIN <=0.5 SENSITIVE Sensitive     ERYTHROMYCIN >=8 RESISTANT Resistant     GENTAMICIN <=0.5 SENSITIVE Sensitive     OXACILLIN <=0.25 SENSITIVE Sensitive     TETRACYCLINE <=1 SENSITIVE Sensitive     VANCOMYCIN <=0.5 SENSITIVE Sensitive     TRIMETH/SULFA <=10 SENSITIVE Sensitive     CLINDAMYCIN <=0.25 SENSITIVE Sensitive     RIFAMPIN <=0.5 SENSITIVE Sensitive     Inducible Clindamycin NEGATIVE Sensitive     * STAPHYLOCOCCUS AUREUS  Blood Culture ID Panel (Reflexed)     Status: Abnormal   Collection Time: 08/20/17  5:03 PM  Result Value Ref Range Status   Enterococcus species NOT DETECTED NOT DETECTED Final   Listeria monocytogenes NOT DETECTED NOT DETECTED Final   Staphylococcus species DETECTED (A) NOT DETECTED Final    Comment: CRITICAL RESULT CALLED TO, READ BACK BY AND VERIFIED WITH: A. JOHNSTON PHARMD, AT 9678 08/21/17 BY D. VANHOOK    Staphylococcus aureus DETECTED (A) NOT DETECTED Final    Comment: Methicillin (oxacillin) susceptible Staphylococcus aureus (MSSA). Preferred therapy is anti staphylococcal beta lactam antibiotic (Cefazolin or Nafcillin), unless clinically contraindicated. CRITICAL RESULT CALLED TO, READ BACK BY AND VERIFIED WITH: A. JOHNSTON PHARMD, AT 9381 08/21/17 BY D. VANHOOK    Methicillin resistance NOT DETECTED NOT  DETECTED Final   Streptococcus species NOT DETECTED NOT DETECTED Final   Streptococcus agalactiae NOT DETECTED NOT DETECTED Final   Streptococcus pneumoniae NOT DETECTED NOT DETECTED Final   Streptococcus pyogenes NOT DETECTED NOT DETECTED Final   Acinetobacter baumannii NOT DETECTED NOT DETECTED Final   Enterobacteriaceae species NOT DETECTED NOT DETECTED Final   Enterobacter cloacae complex NOT DETECTED NOT DETECTED Final   Escherichia coli NOT DETECTED NOT DETECTED Final   Klebsiella oxytoca NOT DETECTED NOT DETECTED Final   Klebsiella pneumoniae NOT DETECTED NOT DETECTED Final   Proteus species NOT DETECTED NOT DETECTED Final   Serratia marcescens NOT DETECTED NOT DETECTED Final   Haemophilus influenzae NOT DETECTED NOT DETECTED Final   Neisseria meningitidis NOT DETECTED NOT DETECTED Final   Pseudomonas aeruginosa NOT DETECTED NOT DETECTED Final   Candida albicans NOT DETECTED NOT DETECTED Final   Candida glabrata NOT DETECTED NOT DETECTED Final   Candida krusei NOT DETECTED NOT DETECTED Final   Candida parapsilosis NOT DETECTED NOT DETECTED Final   Candida tropicalis NOT DETECTED NOT DETECTED Final  Blood Culture (routine x 2)     Status: Abnormal   Collection Time: 08/20/17  5:05 PM  Result Value Ref Range Status   Specimen Description BLOOD RIGHT HAND  Final   Special Requests   Final    BOTTLES DRAWN AEROBIC ONLY Blood Culture adequate volume   Culture  Setup Time   Final    GRAM POSITIVE COCCI IN CLUSTERS AEROBIC BOTTLE ONLY CRITICAL VALUE NOTED.  VALUE IS CONSISTENT WITH PREVIOUSLY REPORTED AND CALLED VALUE.    Culture (A)  Final    STAPHYLOCOCCUS AUREUS SUSCEPTIBILITIES PERFORMED ON PREVIOUS CULTURE WITHIN THE LAST 5 DAYS.    Report Status 08/23/2017 FINAL  Final  Urine culture  Status: None   Collection Time: 08/20/17  6:53 PM  Result Value Ref Range Status   Specimen Description URINE, CATHETERIZED  Final   Special Requests NONE  Final   Culture NO GROWTH   Final   Report Status 08/21/2017 FINAL  Final  Culture, blood (routine x 2)     Status: Abnormal   Collection Time: 08/22/17  4:45 AM  Result Value Ref Range Status   Specimen Description BLOOD LEFT HAND  Final   Special Requests IN PEDIATRIC BOTTLE Blood Culture adequate volume  Final   Culture  Setup Time   Final    GRAM POSITIVE COCCI IN CLUSTERS IN PEDIATRIC BOTTLE CRITICAL RESULT CALLED TO, READ BACK BY AND VERIFIED WITH: J.LEDFORD PHARMD 08/22/17 2343 L.CHAMPION    Culture (A)  Final    STAPHYLOCOCCUS AUREUS SUSCEPTIBILITIES PERFORMED ON PREVIOUS CULTURE WITHIN THE LAST 5 DAYS.    Report Status 08/24/2017 FINAL  Final  Culture, blood (routine x 2)     Status: Abnormal   Collection Time: 08/22/17  4:50 AM  Result Value Ref Range Status   Specimen Description BLOOD RIGHT HAND  Final   Special Requests IN PEDIATRIC BOTTLE Blood Culture adequate volume  Final   Culture  Setup Time   Final    GRAM POSITIVE COCCI IN CLUSTERS IN PEDIATRIC BOTTLE CRITICAL VALUE NOTED.  VALUE IS CONSISTENT WITH PREVIOUSLY REPORTED AND CALLED VALUE.    Culture (A)  Final    STAPHYLOCOCCUS AUREUS SUSCEPTIBILITIES PERFORMED ON PREVIOUS CULTURE WITHIN THE LAST 5 DAYS.    Report Status 08/24/2017 FINAL  Final    Radiology Reports Mr Lumbar Spine W Wo Contrast  Result Date: 08/20/2017 CLINICAL DATA:  Severe RIGHT hip pain radiating to back for 2 days. Back pain beginning last night. Sepsis. History of diabetes. EXAM: MRI LUMBAR SPINE WITHOUT AND WITH CONTRAST TECHNIQUE: Multiplanar and multiecho pulse sequences of the lumbar spine were obtained without and with intravenous contrast. CONTRAST:  74mL MULTIHANCE GADOBENATE DIMEGLUMINE 529 MG/ML IV SOLN COMPARISON:  None. FINDINGS: SEGMENTATION: For the purposes of this report, the last well-formed intervertebral disc will be reported as L5-S1. Transitional anatomy with small S1-2 disc, lumbarized S1 vertebral body. ALIGNMENT: Maintained lumbar lordosis. No  malalignment. VERTEBRAE:Vertebral bodies are intact. Intervertebral discs demonstrate normal morphology, mild desiccation L5-S1 disc. Multilevel mild acute on chronic discogenic endplate changes. No abnormal osseous or suspicious disc enhancement. CONUS MEDULLARIS: Conus medullaris terminates at L2-3 and demonstrates normal morphology and signal characteristics. 2 mm tubular T1 bright signal along the filum terminale with chemical shift artifact consistent with fibro lipomatosis changes. No abnormal cord, leptomeningeal enhancement. PARASPINAL AND SOFT TISSUES: Faint enhancement and bright interstitial STIR signal paraspinal muscles mid to lower lumbar spine. No focal fluid collection. DISC LEVELS: T12-L1 thru L2-3: No disc bulge, canal stenosis nor neural foraminal narrowing. L3-4: No disc bulge. Moderate RIGHT mild LEFT facet arthropathy without canal stenosis or neural foraminal narrowing. L4-5: Small RIGHT subarticular disc protrusion with faint enhancing annular fissure. Moderate RIGHT greater than LEFT facet arthropathy and ligamentum flavum redundancy. Trace RIGHT facet synovial cyst within dorsal epidural space. No canal stenosis. Mild RIGHT neural foraminal narrowing. L5-S1: Small LEFT subarticular disc protrusion. Central enhancing annular fissure. Severe facet arthropathy. No canal stenosis. Moderate RIGHT, moderate to severe LEFT neural foraminal narrowing. IMPRESSION: 1. No MR findings of discitis osteomyelitis. Faintly enhancing paraspinal muscles seen with low-grade strain, less likely myositis. 2. Small RIGHT L4-5 disc protrusion with annular fissure may be acute. 3. Degenerative change  of the lumbar spine. No canal stenosis. Neural foraminal narrowing L4-5 and L5-S1: Moderate to severe on the LEFT at L5-S1. 4. Low-lying spinal cord with fibro lipomatosis changes of filum terminale, no cord tethering. Electronically Signed   By: Elon Alas M.D.   On: 08/20/2017 22:43   Mr Hip Right W Wo  Contrast  Result Date: 08/20/2017 CLINICAL DATA:  Right hip pain. No known injury. Suspect soft tissue infection. EXAM: MRI OF THE RIGHT HIP WITHOUT AND WITH CONTRAST TECHNIQUE: Multiplanar, multisequence MR imaging was performed both before and after administration of intravenous contrast. CONTRAST:  77mL MULTIHANCE GADOBENATE DIMEGLUMINE 529 MG/ML IV SOLN COMPARISON:  Right hip radiographs 08/20/2017. MRI lumbar spine 11/16/2012 FINDINGS: Bones: No focal bone lesions identified. Bone cortex appears intact. Marrow signal intensities are homogeneous and normal. Articular cartilage and labrum Articular cartilage:  Appears homogeneous and intact. Labrum:  Appears intact. Joint or bursal effusion Joint effusion: Small joint effusions are present bilaterally and are symmetrical. Bursae:  No abnormal bursal collections. Muscles and tendons Muscles and tendons: Normal appearance. No abnormal signal intensity changes, expansile process, solid mass, or fluid collections identified. Other findings Miscellaneous: Visualized portions of the pelvic organs are unremarkable. IMPRESSION: No acute process identified. No evidence of acute fracture, bone contusion, or inflammatory change involving the right hip or musculature. Electronically Signed   By: Lucienne Capers M.D.   On: 08/20/2017 23:25   Ct Abdomen Pelvis W Contrast  Result Date: 08/21/2017 CLINICAL DATA:  Back pain EXAM: CT ABDOMEN AND PELVIS WITH CONTRAST TECHNIQUE: Multidetector CT imaging of the abdomen and pelvis was performed using the standard protocol following bolus administration of intravenous contrast. CONTRAST:  183mL ISOVUE-300 IOPAMIDOL (ISOVUE-300) INJECTION 61% COMPARISON:  08/20/2017, 11/10/2011 FINDINGS: Lower chest: Lung bases demonstrate patchy dependent atelectasis. No consolidation or pleural effusion. Heart size within normal limits. Hepatobiliary: Hepatic steatosis. Post cholecystectomy. Prominent extrahepatic bile duct likely due to  postsurgical changes. Pancreas: Unremarkable. No pancreatic ductal dilatation or surrounding inflammatory changes. Spleen: Normal in size without focal abnormality. Adrenals/Urinary Tract: Adrenal glands are unremarkable. Kidneys are normal, without renal calculi, focal lesion, or hydronephrosis. Bladder is unremarkable. Stomach/Bowel: Stomach is within normal limits. Status post appendectomy. No evidence of bowel wall thickening, distention, or inflammatory changes. Vascular/Lymphatic: Aortic atherosclerosis. No enlarged abdominal or pelvic lymph nodes. Reproductive: Status post hysterectomy. No adnexal masses. Other: Negative for free air or free fluid.  Fat in the umbilicus Musculoskeletal: Degenerative changes. No acute or suspicious bone lesion IMPRESSION: 1. No CT evidence for acute intra-abdominal or pelvic abnormality 2. Hepatic steatosis Electronically Signed   By: Donavan Foil M.D.   On: 08/21/2017 02:17   Dg Chest Port 1 View  Result Date: 08/20/2017 CLINICAL DATA:  Nodes sepsis.  Fever.  Shortness of breath. EXAM: PORTABLE CHEST 1 VIEW COMPARISON:  None. FINDINGS: The heart size and mediastinal contours are within normal limits. Both lungs are clear. The visualized skeletal structures are unremarkable. IMPRESSION: No active disease. Electronically Signed   By: Dorise Bullion III M.D   On: 08/20/2017 17:28   Dg Hip Unilat W Or Wo Pelvis 2-3 Views Right  Result Date: 08/20/2017 CLINICAL DATA:  Right hip pain.  No known injury. EXAM: DG HIP (WITH OR WITHOUT PELVIS) 2-3V RIGHT COMPARISON:  None. FINDINGS: There is no evidence of hip fracture or dislocation. There is no evidence of arthropathy or other focal bone abnormality. IMPRESSION: Normal examination. Electronically Signed   By: Claudie Revering M.D.   On: 08/20/2017 09:18  Lab Data:  CBC: Recent Labs  Lab 08/20/17 1640 08/22/17 0444 08/23/17 0410  WBC 11.4* 6.8 5.8  NEUTROABS 10.1*  --   --   HGB 12.7 10.0* 10.2*  HCT 37.7 30.2*  30.3*  MCV 83.2 82.3 80.6  PLT 155 110* 254*   Basic Metabolic Panel: Recent Labs  Lab 08/20/17 1640 08/22/17 0444 08/23/17 0410 08/24/17 0544  NA 137 137 134* 137  K 2.8* 3.4* 3.8 3.6  CL 100* 110 106 106  CO2 27 21* 22 24  GLUCOSE 212* 112* 132* 118*  BUN 12 13 9 6   CREATININE 0.98 1.09* 0.96 0.84  CALCIUM 8.7* 7.4* 7.8* 8.0*  MG  --  1.4*  --  1.7   GFR: Estimated Creatinine Clearance: 79.8 mL/min (by C-G formula based on SCr of 0.84 mg/dL). Liver Function Tests: Recent Labs  Lab 08/20/17 1640 08/22/17 0444  AST 17 24  ALT 13* 15  ALKPHOS 77 69  BILITOT 0.8 0.5  PROT 7.3 5.6*  ALBUMIN 3.5 2.3*   Recent Labs  Lab 08/20/17 1640  LIPASE 24   No results for input(s): AMMONIA in the last 168 hours. Coagulation Profile: Recent Labs  Lab 08/24/17 0544  INR 1.05   Cardiac Enzymes: No results for input(s): CKTOTAL, CKMB, CKMBINDEX, TROPONINI in the last 168 hours. BNP (last 3 results) No results for input(s): PROBNP in the last 8760 hours. HbA1C: No results for input(s): HGBA1C in the last 72 hours. CBG: Recent Labs  Lab 08/23/17 1152 08/23/17 1208 08/23/17 1728 08/23/17 2058 08/24/17 0809  GLUCAP 156* 150* 152* 113* 97   Lipid Profile: No results for input(s): CHOL, HDL, LDLCALC, TRIG, CHOLHDL, LDLDIRECT in the last 72 hours. Thyroid Function Tests: No results for input(s): TSH, T4TOTAL, FREET4, T3FREE, THYROIDAB in the last 72 hours. Anemia Panel: No results for input(s): VITAMINB12, FOLATE, FERRITIN, TIBC, IRON, RETICCTPCT in the last 72 hours. Urine analysis:    Component Value Date/Time   COLORURINE YELLOW 08/20/2017 1841   APPEARANCEUR CLEAR 08/20/2017 1841   LABSPEC 1.010 08/20/2017 1841   PHURINE 6.0 08/20/2017 1841   GLUCOSEU >=500 (A) 08/20/2017 1841   HGBUR NEGATIVE 08/20/2017 1841   HGBUR negative 09/03/2007 1610   BILIRUBINUR NEGATIVE 08/20/2017 1841   BILIRUBINUR neg 05/09/2016 1324   KETONESUR NEGATIVE 08/20/2017 1841    PROTEINUR NEGATIVE 08/20/2017 1841   UROBILINOGEN 0.2 05/09/2016 1324   UROBILINOGEN 1.0 02/09/2014 1257   NITRITE NEGATIVE 08/20/2017 1841   LEUKOCYTESUR NEGATIVE 08/20/2017 1841     Deja Pisarski M.D. [facility] 08/24/2017, 10:36 AM  Pager: [phone] Between 7am to 7pm - call Pager - [phone]  After 7pm go to [email or web address] - password [email or web address]  Call night coverage person covering after 7pm

## 2017-08-24 NOTE — Consult Note (Signed)
[facility] CM Primary Care Navigator  08/24/2017  Heather Walker 03-27-1961 [id number]    [facility] CM Primary Care Navigator  08/24/2017  Heather Walker 10-Sep-1961 [id number]   Seen patient and met with son Elberta Fortis) at the bedside to identify possible discharge needs. Son states that patient had been sleepy possibly from pain medications taken. Patient awakens on and off but goes back to sleep during this visit. Son reports that patient have had severe persistent back and hip pain going down to her legs that resulted to this admission.   Patient's son is not sure of primary care provider's name with Therapist, music at [employer]. Called and verified with office and was confirmed that patient's PCP is Dr. Garnet Koyanagi- [place].    Per son, patient is using Product/process development scientist on [facility] to obtain medications without any problem.   Patient is managing her medications at home straight out of the containers according to son.   Patient was driving prior to admission but husband Joneen Boers) will be able to provide transportation to her doctors' appointments after discharge.  Patient has been independent prior to hospitalization. Son states that patient's husband will be the primary caregiver at home and her children take turns in checking on patient and needs.  Discharge plan is to be determined per MD note- pending improvement in condition.  Patient's son voiced understanding to call primary care provider's office when patient returns home for a post discharge follow-up appointment within a week or sooner if needs arise. Patient letter (with PCP's contact number) was provided as a reminder.  Explained to patient's son regarding [facility] CM services available for healthmanagement at home and he was interested about it but patient is not fully awake to decide.  Encouraged patient's son to seekreferral from primary care provider to [facility] care management if deemed necessary  andappropriate for services in the near future.  [facility] care management information provided for future needs that patient may have.   Primary care provider's office called Guerry Bruin) and notified of patient's admission; need for transition of care and post discharge follow-up and discussed regarding patient's health issues needing follow-up (elevated A1C levels - uncontrolled DM).   Made aware to refer patient to [facility] care management if deemed necessary and appropriate for services (on patient's next follow-up visit).   For additional questions please contact:  Edwena Felty A. Eagan Shifflett, BSN, RN-BC [facility] PRIMARY CARE Navigator Cell: [phone]

## 2017-08-24 NOTE — Progress Notes (Signed)
Pharmacy Antibiotic Note  Heather Walker is a 56 y.o. female admitted on 08/20/2017 with MSSA bacteremia.  Pharmacy has been consulted for Cefazolin dosing.  MRI negative, TSS negative for vegetations.  BCx still growing MSSA.  SCr stable.  Plan: Cefazolin 2gm IV q8h Follow-up ID recs re: LOT Needs repeat BCx to show clearance  Height: 5\' 2"  (157.5 cm) Weight: 198 lb (89.8 kg) IBW/kg (Calculated) : 50.1  Temp (24hrs), Avg:99 F (37.2 C), Min:98.6 F (37 C), Max:99.6 F (37.6 C)  Recent Labs  Lab 08/20/17 1640 08/20/17 1659 08/20/17 2243 08/22/17 0444 08/23/17 0410 08/24/17 0544  WBC 11.4*  --   --  6.8 5.8  --   CREATININE 0.98  --   --  1.09* 0.96 0.84  LATICACIDVEN  --  1.70 2.39*  --   --  1.0    Estimated Creatinine Clearance: 79.8 mL/min (by C-G formula based on SCr of 0.84 mg/dL).    Allergies  Allergen Reactions  . Atorvastatin Other (See Comments)    Neck stiffness  . Codeine Itching    Antimicrobials this admission: Zosyn 11/4 >>11/5 Vancomycin 11/4 >>11/5 Ancef 11/5 >>  Dose adjustments this admission:   Microbiology results: 11/4 Urine >> neg 11/4 blood x 2 >> MSSA (R Ery) BCID with MSSA 11/6 blood x 2 >> MSSA   Thank you for allowing pharmacy to be a part of this patient's care.  [employer], Pharm.D., BCPS Clinical Pharmacist Pager: [phone] Clinical phone for 08/24/2017 from 8:30-4:00 is [phone]. After 4pm, please call Main Rx ([phone]) for assistance. 08/24/2017 11:42 AM

## 2017-08-24 NOTE — Interval H&P Note (Signed)
History and Physical Interval Note:  08/24/2017 10:08 AM  Heather Walker  has presented today for surgery, with the diagnosis of BACTEREMIA  The various methods of treatment have been discussed with the patient and family. After consideration of risks, benefits and other options for treatment, the patient has consented to  Procedure(s): TRANSESOPHAGEAL ECHOCARDIOGRAM (TEE) (N/A) as a surgical intervention .  The patient's history has been reviewed, patient examined, no change in status, stable for surgery.  I have reviewed the patient's chart and labs.  Questions were answered to the patient's satisfaction.     Dalton [employer]

## 2017-08-24 NOTE — Progress Notes (Signed)
Patient was able to swallow water safely with out any troubles.

## 2017-08-24 NOTE — Progress Notes (Signed)
Physical Therapy Treatment Patient Details Name: Heather Walker MRN: [id number] DOB: 03-06-1961 Today's Date: 08/24/2017    History of Present Illness Heather Walker a 56 y.o.femalewith medical history significant ofDM2, HTN. Patient presents to the ED with c/o severe lower back and R hip pain. MRI negative however pt with sepsis.  TEE perfomed on 08/24/17--negative for endocarditis    PT Comments    Pt progressing slowly; arouses easily but still groggy today--possibly d/t TEE earlier this am; pt does complain of continued hip and back pain and is requesting to be able to eat;   May need HHPT if continued slow progress; will continue to follow in acute setting   Follow Up Recommendations  Home health PT;No PT follow up(depending on progress)     Equipment Recommendations  Rolling walker with 5" wheels;3in1 (PT)    Recommendations for Other Services       Precautions / Restrictions Precautions Precautions: Fall Restrictions Weight Bearing Restrictions: No    Mobility  Bed Mobility Overal bed mobility: Needs Assistance Bed Mobility: Rolling;Sidelying to Sit;Sit to Sidelying Rolling: Min assist Sidelying to sit: Min assist     Sit to sidelying: Min assist General bed mobility comments: hand over  hand and multi-modal cues for pt to assist self from s/l to sitting, rail used, HOB flat; assist to bring bil LEs onto bed,incr time needed  Transfers Overall transfer level: Needs assistance Equipment used: Rolling walker (2 wheeled) Transfers: Sit to/from [facility] Sit to Stand: Min assist Stand pivot transfers: Min assist       General transfer comment: v/c's for safe hand placement, minA for initial power up and to remain steady during transition of hands from bed to RW;  cues needed throughout to utilize RW to assist with pt unsteadiness during stand-step pivot  Ambulation/Gait             General Gait Details: (gait deferred d/t  unsteadiness during transfer/lethargy)   Stairs            Wheelchair Mobility    Modified Rankin (Stroke Patients Only)       Balance   Sitting-balance support: Feet supported;Bilateral upper extremity supported Sitting balance-Leahy Scale: Fair     Standing balance support: Bilateral upper extremity supported Standing balance-Leahy Scale: Poor Standing balance comment: requires UE support and support from PT; pt with ataxic type movement during transfer                             Cognition Arousal/Alertness: (awake but groggy--possibly 2* TEE/meds earlier today) Behavior During Therapy: WFL for tasks assessed/performed Overall Cognitive Status: Within Functional Limits for tasks assessed                                 General Comments: pt follows commands, is awake but minimally conversant other than to say she's hurting      Exercises      General Comments        Pertinent Vitals/Pain Pain Assessment: Faces Faces Pain Scale: Hurts little more Pain Location: R hip and back Pain Descriptors / Indicators: Grimacing Pain Intervention(s): Monitored during session    Home Living                      Prior Function            PT Goals (  current goals can now be found in the care plan section) Acute Rehab PT Goals Patient Stated Goal: decrease pain PT Goal Formulation: With patient Time For Goal Achievement: 09/06/17 Potential to Achieve Goals: Good Progress towards PT goals: Progressing toward goals    Frequency    Min 3X/week      PT Plan Current plan remains appropriate    Co-evaluation              AM-PAC PT "6 Clicks" Daily Activity  Outcome Measure  Difficulty turning over in bed (including adjusting bedclothes, sheets and blankets)?: Unable Difficulty moving from lying on back to sitting on the side of the bed? : Unable Difficulty sitting down on and standing up from a chair with arms (e.g.,  wheelchair, bedside commode, etc,.)?: Unable Help needed moving to and from a bed to chair (including a wheelchair)?: A Little Help needed walking in hospital room?: A Lot Help needed climbing 3-5 steps with a railing? : A Lot 6 Click Score: 10    End of Session Equipment Utilized During Treatment: Gait belt Activity Tolerance: Patient limited by pain(arousable but groggy --? d/t earlier sedation) Patient left: in bed;with call bell/phone within reach;with nursing/sitter in room Nurse Communication: Mobility status PT Visit Diagnosis: Difficulty in walking, not elsewhere classified (R26.2);Pain Pain - Right/Left: Right Pain - part of body: Hip(back)     Time: 0964-3838 PT Time Calculation (min) (ACUTE ONLY): 11 min  Charges:  $Therapeutic Activity: 8-22 mins                    G CodesKenyon Ana, PT Pager: [phone] 08/24/2017    Kenyon Ana 08/24/2017, 3:21 PM

## 2017-08-25 ENCOUNTER — Encounter (HOSPITAL_COMMUNITY): Payer: Self-pay | Admitting: Cardiology

## 2017-08-25 LAB — BASIC METABOLIC PANEL
Anion gap: 6 (ref 5–15)
BUN: 6 mg/dL (ref 6–20)
CO2: 28 mmol/L (ref 22–32)
Calcium: 8.2 mg/dL — ABNORMAL LOW (ref 8.9–10.3)
Chloride: 103 mmol/L (ref 101–111)
Creatinine, Ser: 0.73 mg/dL (ref 0.44–1.00)
GFR calc Af Amer: >60 mL/min (ref >60)
GFR calc non Af Amer: >60 mL/min (ref >60)
Glucose, Bld: 148 mg/dL — ABNORMAL HIGH (ref 65–99)
Potassium: 3 mmol/L — ABNORMAL LOW (ref 3.5–5.1)
Sodium: 137 mmol/L (ref 135–145)

## 2017-08-25 LAB — GLUCOSE, CAPILLARY
Glucose-Capillary: 120 mg/dL — ABNORMAL HIGH (ref 65–99)
Glucose-Capillary: 136 mg/dL — ABNORMAL HIGH (ref 65–99)
Glucose-Capillary: 123 mg/dL — ABNORMAL HIGH (ref 65–99)
Glucose-Capillary: 164 mg/dL — ABNORMAL HIGH (ref 65–99)

## 2017-08-25 LAB — HEMOGLOBIN A1C
Hgb A1c MFr Bld: 8.9 % — ABNORMAL HIGH (ref 4.8–5.6)
Mean Plasma Glucose: 208.73 mg/dL

## 2017-08-25 LAB — MAGNESIUM: Magnesium: 1.5 mg/dL — ABNORMAL LOW (ref 1.7–2.4)

## 2017-08-25 MED ORDER — INSULIN GLARGINE 100 UNIT/ML ~~LOC~~ SOLN
5.0000 [IU] | Freq: Every day | SUBCUTANEOUS | Status: DC
  Administered 2017-08-25 – 2017-08-27 (×3): 5 [IU] via SUBCUTANEOUS
  Filled 2017-08-25 (×4): qty 0.05

## 2017-08-25 NOTE — Progress Notes (Signed)
PHARMACY CONSULT NOTE FOR:  OUTPATIENT  PARENTERAL ANTIBIOTIC THERAPY (OPAT)  Indication: MSSA bacteremia Regimen:  Cefazolin 2gm IV q8hrs End date:  09/21/17 - or 4 weeks from negative blood cultures.                                  - 08/24/17 blood cultures with no growth x 1 day to date.  IV antibiotic discharge orders are pended. To discharging provider:  please sign these orders via discharge navigator,  Select New Orders & click on the button choice - Manage This Unsigned Work.     Thank you for allowing pharmacy to be a part of this patient's care.  Arty Baumgartner, [place] Pager: [phone] 08/25/2017, 5:04 PM

## 2017-08-25 NOTE — Progress Notes (Signed)
[facility] for Infectious Disease  Date of Admission:  08/20/2017     Total days of antibiotics 6 Ancef 11/5 >> Vancomycin 11/4 >> 11/5 Zosyn 11/4 >> 11/5          ASSESSMENT/PLAN  MSSA bacteremia / Sepsis - Unknown source. Continue Ancef. TEE negative. Repeat blood cultures on 11/8 remain in process. Will treat for complicated bacteremia with 4 weeks of Ancef on discharge. PICC line when blood cultures are clear for 72 hours.   Back / Hip Pain - Appears to be improving with current therapy and PT/OT. No evidence of infection.      [place] [facility]icrobial Management Team Staphylococcus aureus bacteremia   Staphylococcus aureus bacteremia (SAB) is associated with a high rate of complications and mortality.  Specific aspects of clinical management are critical to optimizing the outcome of patients with SAB.  Therefore, the [facility] Antimicrobial Management Team [facility]) has initiated an intervention aimed at improving the management of SAB at [facility].  To do so, Infectious Diseases physicians are providing an evidence-based consult for the management of all patients with SAB.     Yes No Comments  Perform follow-up blood cultures (even if the patient is afebrile) to ensure clearance of bacteremia []  []  Repeat blood cultures 11/8 in process  Remove vascular catheter and obtain follow-up blood cultures after the removal of the catheter []  []  None  Perform echocardiography to evaluate for endocarditis (transthoracic ECHO is 40-50% sensitive, TEE is > 90% sensitive) []  []  TEE 11/8 with no vegetation.    Consult electrophysiologist to evaluate implanted cardiac device (pacemaker, ICD) []  []  No pacemaker present  Ensure source control []  []  Unknown source - MRI of the hip and back are negative for evidence of infection.   Investigate for "metastatic" sites of infection []  []  Continued back pain.    Change antibiotic therapy to __________________ []  []  Continue Ancef  Estimated  duration of IV antibiotic therapy:   []  []  4 weeks post discharge of Ancef.     Principal Problem:   Sepsis ([place]) Active Problems:   Essential hypertension   Diabetes mellitus type II, uncontrolled ([place])   Acute right hip pain   . cloNIDine  0.2 mg Oral BID  . enoxaparin (LOVENOX) injection  40 mg Subcutaneous Q24H  . insulin aspart  0-15 Units Subcutaneous TID WC  . pantoprazole  40 mg Oral Daily    SUBJECTIVE: Up in the chair and indicates that both her back and hip are feeling better. Afebrile overnight.  Allergies  Allergen Reactions  . Atorvastatin Other (See Comments)    Neck stiffness  . Codeine Itching     Review of Systems: Review of Systems  Constitutional: Negative for chills and fever.  Respiratory: Negative for cough, shortness of breath and wheezing.   Cardiovascular: Negative for chest pain and leg swelling.  Gastrointestinal: Negative for abdominal pain, constipation, diarrhea, nausea and vomiting.  Musculoskeletal: Positive for back pain.  Neurological: Negative for weakness.      OBJECTIVE: Vitals:   08/24/17 1521 08/24/17 2100 08/24/17 2148 08/25/17 0522  BP: (!) 177/93  (!) 142/87 (!) 158/87  Pulse: 94  90 71  Resp:   20 19  Temp: 99.8 F (37.7 C)  98.7 F (37.1 C) 98.2 F (36.8 C)  TempSrc: Oral  Oral Oral  SpO2: 100% 100%  100%  Weight:      Height:       Body mass index is 36.21 kg/m.  Physical Exam  Constitutional: She is oriented to person, place, and time and well-developed, well-nourished, and in no distress. No distress.  Seated in the chair talking on the phone.   Cardiovascular: Normal rate, regular rhythm and intact distal pulses.  Murmur heard. Pulmonary/Chest: Effort normal and breath sounds normal. No respiratory distress. She has no wheezes. She has no rales. She exhibits no tenderness.  Abdominal: Soft. Bowel sounds are normal. She exhibits no distension and no mass. There is no tenderness. There is no rebound and no  guarding.  Neurological: She is alert and oriented to person, place, and time.  Skin: Skin is warm and dry.  Psychiatric: Mood, memory, affect and judgment normal.    Lab Results Lab Results  Component Value Date   WBC 5.8 08/23/2017   HGB 10.2 (L) 08/23/2017   HCT 30.3 (L) 08/23/2017   MCV 80.6 08/23/2017   PLT 136 (L) 08/23/2017    Lab Results  Component Value Date   CREATININE 0.73 08/25/2017   BUN 6 08/25/2017   NA 137 08/25/2017   K 3.0 (L) 08/25/2017   CL 103 08/25/2017   CO2 28 08/25/2017    Lab Results  Component Value Date   ALT 15 08/22/2017   AST 24 08/22/2017   ALKPHOS 69 08/22/2017   BILITOT 0.5 08/22/2017     Microbiology: Recent Results (from the past 240 hour(s))  Blood Culture (routine x 2)     Status: Abnormal   Collection Time: 08/20/17  5:03 PM  Result Value Ref Range Status   Specimen Description BLOOD RIGHT ANTECUBITAL  Final   Special Requests   Final    BOTTLES DRAWN AEROBIC AND ANAEROBIC Blood Culture adequate volume   Culture  Setup Time   Final    GRAM POSITIVE COCCI IN CLUSTERS IN BOTH AEROBIC AND ANAEROBIC BOTTLES CRITICAL RESULT CALLED TO, READ BACK BY AND VERIFIED WITH: A. JOHNSTON PHARMD, AT 0748 08/21/17 BY D. VANHOOK    Culture STAPHYLOCOCCUS AUREUS (A)  Final   Report Status 08/23/2017 FINAL  Final   Organism ID, Bacteria STAPHYLOCOCCUS AUREUS  Final      Susceptibility   Staphylococcus aureus - MIC*    CIPROFLOXACIN <=0.5 SENSITIVE Sensitive     ERYTHROMYCIN >=8 RESISTANT Resistant     GENTAMICIN <=0.5 SENSITIVE Sensitive     OXACILLIN <=0.25 SENSITIVE Sensitive     TETRACYCLINE <=1 SENSITIVE Sensitive     VANCOMYCIN <=0.5 SENSITIVE Sensitive     TRIMETH/SULFA <=10 SENSITIVE Sensitive     CLINDAMYCIN <=0.25 SENSITIVE Sensitive     RIFAMPIN <=0.5 SENSITIVE Sensitive     Inducible Clindamycin NEGATIVE Sensitive     * STAPHYLOCOCCUS AUREUS  Blood Culture ID Panel (Reflexed)     Status: Abnormal   Collection Time: 08/20/17   5:03 PM  Result Value Ref Range Status   Enterococcus species NOT DETECTED NOT DETECTED Final   Listeria monocytogenes NOT DETECTED NOT DETECTED Final   Staphylococcus species DETECTED (A) NOT DETECTED Final    Comment: CRITICAL RESULT CALLED TO, READ BACK BY AND VERIFIED WITH: A. JOHNSTON PHARMD, AT 4081 08/21/17 BY D. VANHOOK    Staphylococcus aureus DETECTED (A) NOT DETECTED Final    Comment: Methicillin (oxacillin) susceptible Staphylococcus aureus (MSSA). Preferred therapy is anti staphylococcal beta lactam antibiotic (Cefazolin or Nafcillin), unless clinically contraindicated. CRITICAL RESULT CALLED TO, READ BACK BY AND VERIFIED WITH: A. JOHNSTON PHARMD, AT 0748 08/21/17 BY D. VANHOOK    Methicillin resistance NOT DETECTED NOT DETECTED Final  Streptococcus species NOT DETECTED NOT DETECTED Final   Streptococcus agalactiae NOT DETECTED NOT DETECTED Final   Streptococcus pneumoniae NOT DETECTED NOT DETECTED Final   Streptococcus pyogenes NOT DETECTED NOT DETECTED Final   Acinetobacter baumannii NOT DETECTED NOT DETECTED Final   Enterobacteriaceae species NOT DETECTED NOT DETECTED Final   Enterobacter cloacae complex NOT DETECTED NOT DETECTED Final   Escherichia coli NOT DETECTED NOT DETECTED Final   Klebsiella oxytoca NOT DETECTED NOT DETECTED Final   Klebsiella pneumoniae NOT DETECTED NOT DETECTED Final   Proteus species NOT DETECTED NOT DETECTED Final   Serratia marcescens NOT DETECTED NOT DETECTED Final   Haemophilus influenzae NOT DETECTED NOT DETECTED Final   Neisseria meningitidis NOT DETECTED NOT DETECTED Final   Pseudomonas aeruginosa NOT DETECTED NOT DETECTED Final   Candida albicans NOT DETECTED NOT DETECTED Final   Candida glabrata NOT DETECTED NOT DETECTED Final   Candida krusei NOT DETECTED NOT DETECTED Final   Candida parapsilosis NOT DETECTED NOT DETECTED Final   Candida tropicalis NOT DETECTED NOT DETECTED Final  Blood Culture (routine x 2)     Status: Abnormal    Collection Time: 08/20/17  5:05 PM  Result Value Ref Range Status   Specimen Description BLOOD RIGHT HAND  Final   Special Requests   Final    BOTTLES DRAWN AEROBIC ONLY Blood Culture adequate volume   Culture  Setup Time   Final    GRAM POSITIVE COCCI IN CLUSTERS AEROBIC BOTTLE ONLY CRITICAL VALUE NOTED.  VALUE IS CONSISTENT WITH PREVIOUSLY REPORTED AND CALLED VALUE.    Culture (A)  Final    STAPHYLOCOCCUS AUREUS SUSCEPTIBILITIES PERFORMED ON PREVIOUS CULTURE WITHIN THE LAST 5 DAYS.    Report Status 08/23/2017 FINAL  Final  Urine culture     Status: None   Collection Time: 08/20/17  6:53 PM  Result Value Ref Range Status   Specimen Description URINE, CATHETERIZED  Final   Special Requests NONE  Final   Culture NO GROWTH  Final   Report Status 08/21/2017 FINAL  Final  Culture, blood (routine x 2)     Status: Abnormal   Collection Time: 08/22/17  4:45 AM  Result Value Ref Range Status   Specimen Description BLOOD LEFT HAND  Final   Special Requests IN PEDIATRIC BOTTLE Blood Culture adequate volume  Final   Culture  Setup Time   Final    GRAM POSITIVE COCCI IN CLUSTERS IN PEDIATRIC BOTTLE CRITICAL RESULT CALLED TO, READ BACK BY AND VERIFIED WITH: J.LEDFORD PHARMD 08/22/17 2343 L.CHAMPION    Culture (A)  Final    STAPHYLOCOCCUS AUREUS SUSCEPTIBILITIES PERFORMED ON PREVIOUS CULTURE WITHIN THE LAST 5 DAYS.    Report Status 08/24/2017 FINAL  Final  Culture, blood (routine x 2)     Status: Abnormal   Collection Time: 08/22/17  4:50 AM  Result Value Ref Range Status   Specimen Description BLOOD RIGHT HAND  Final   Special Requests IN PEDIATRIC BOTTLE Blood Culture adequate volume  Final   Culture  Setup Time   Final    GRAM POSITIVE COCCI IN CLUSTERS IN PEDIATRIC BOTTLE CRITICAL VALUE NOTED.  VALUE IS CONSISTENT WITH PREVIOUSLY REPORTED AND CALLED VALUE.    Culture (A)  Final    STAPHYLOCOCCUS AUREUS SUSCEPTIBILITIES PERFORMED ON PREVIOUS CULTURE WITHIN THE LAST 5 DAYS.     Report Status 08/24/2017 FINAL  Final     Terri Piedra, NP [facility] [phone] Pager  08/25/2017  11:23 AM

## 2017-08-25 NOTE — Progress Notes (Signed)
[facility]  [facility] is prepared for weekend DC for [facility] for home IV ABX.  OPAT consult has been entered but IV ABX script for home needs to be entered by Kathy Breach for [facility] pharmacy.   If patient discharges after hours, please call [phone].   Heather Walker 08/25/2017, 4:32 PM

## 2017-08-25 NOTE — Care Management Note (Addendum)
Case Management Note  Patient Details  Name: Heather Walker MRN: [id number] Date of Birth: Jul 14, 1961  Subjective/Objective:       Admitted with sepsis/MSSA bacteremia, hx DM, HTN, Heart Murmur,Arthritis. From home with husband.  Heather Walker (Spouse) Lucia Gaskins [phone]          PCP: Garnet Koyanagi  Action/Plan:  ID following .Marland KitchenMarland KitchenMarland KitchenMarland Kitchen pt will need 4 weeks of IV antibiotics, PICC line to be placed. OPAT consult in place. [facility] to provide IV home infusion.      Expected Discharge Date: 08/26/2017 Expected Discharge Plan:  [place]  In-House Referral:     Discharge planning Services  CM Consult  Post Acute Care Choice:    Choice offered to:  Patient  DME Arranged:  IV pump/equipment, Gilford Rile, 3-N-1 DME Agency:  [place] Arranged:  RN, PT, OT [facility] Agency:  [facility] [place]  Status of Service:  completed  If discussed at [place] of Stay Meetings, dates discussed:    Additional Comments:  Sharin Mons, RN 08/25/2017, 2:33 PM

## 2017-08-25 NOTE — Progress Notes (Signed)
Occupational Therapy Treatment Patient Details Name: Heather Walker MRN: [id number] DOB: May 15, 1961 Today's Date: 08/25/2017    History of present illness Heather Walker a 56 y.o.femalewith medical history significant ofDM2, HTN. Patient presents to the ED with c/o severe lower back and R hip pain. MRI negative however pt with sepsis.  TEE perfomed on 08/24/17--negative for endocarditis   OT comments  Pt progressing. Reports using BSC independently. Ambulated to bathroom pushing IV pole with min guard assist, managed toileting and standing grooming with supervision. Pt able to don socks in long sitting without assist. Back pain continues to be an issue interfering with ambulation and prolonged standing.  Follow Up Recommendations  Home health OT;Supervision/Assistance - 24 hour    Equipment Recommendations  3 in 1 bedside commode    Recommendations for Other Services      Precautions / Restrictions Precautions Precautions: Fall Precaution Comments: pain Restrictions Weight Bearing Restrictions: No       Mobility Bed Mobility Overal bed mobility: Modified Independent                Transfers Overall transfer level: Needs assistance Equipment used: None Transfers: Sit to/from Stand Sit to Stand: Supervision         General transfer comment: stood without support, used IV pole for ambulation    Balance Overall balance assessment: Needs assistance   Sitting balance-Leahy Scale: Good Sitting balance - Comments: no LOB with donning socks     Standing balance-Leahy Scale: Poor Standing balance comment: requires one hand on a surface to balance                            ADL either performed or assessed with clinical judgement   ADL Overall ADL's : Needs assistance/impaired     Grooming: Wash/dry hands;Standing;Min guard           Upper Body Dressing : Set up;Sitting   Lower Body Dressing: Set up;Sitting/lateral leans(long sitting in  bed)   Toilet Transfer: Min guard(pushed IV pole)   Toileting- Clothing Manipulation and Hygiene: Supervision/safety;Sit to/from stand       Functional mobility during ADLs: Min guard(pushing IV pole)       Vision       Perception     Praxis      Cognition Arousal/Alertness: Awake/alert Behavior During Therapy: WFL for tasks assessed/performed Overall Cognitive Status: Within Functional Limits for tasks assessed                                          Exercises     Shoulder Instructions       General Comments      Pertinent Vitals/ Pain       Pain Assessment: Faces Faces Pain Scale: Hurts little more Pain Location: back Pain Descriptors / Indicators: Grimacing;Guarding Pain Intervention(s): Monitored during session;Repositioned  Home Living                                          Prior Functioning/Environment              Frequency  Min 2X/week        Progress Toward Goals  OT Goals(current goals can now be found in the care plan section)  Progress towards OT goals: Progressing toward goals  Acute Rehab OT Goals Patient Stated Goal: to go home tomorrow OT Goal Formulation: With patient Time For Goal Achievement: 09/06/17 Potential to Achieve Goals: Good  Plan Discharge plan remains appropriate    Co-evaluation                 AM-PAC PT "6 Clicks" Daily Activity     Outcome Measure   Help from another person eating meals?: None Help from another person taking care of personal grooming?: A Little Help from another person toileting, which includes using toliet, bedpan, or urinal?: A Little Help from another person bathing (including washing, rinsing, drying)?: A Little Help from another person to put on and taking off regular upper body clothing?: None Help from another person to put on and taking off regular lower body clothing?: A Little 6 Click Score: 20    End of Session Equipment  Utilized During Treatment: Gait belt  OT Visit Diagnosis: Unsteadiness on feet (R26.81);Muscle weakness (generalized) (M62.81)   Activity Tolerance Patient tolerated treatment well   Patient Left in chair;with call bell/phone within reach;with nursing/sitter in room   Nurse Communication Mobility status(urine amount, ok to give diet sprite)        Time: 1001-1036 OT Time Calculation (min): 35 min  Charges: OT General Charges $OT Visit: 1 Visit OT Treatments $Self Care/Home Management : 23-37 mins  08/25/2017 Nestor Lewandowsky, OTR/L Pager: [phone]   Heather Walker 08/25/2017, 10:42 AM

## 2017-08-25 NOTE — Care Management Important Message (Signed)
Important Message  Patient Details  Name: Heather Walker MRN: [id number] Date of Birth: Sep 06, 1961   Medicare Important Message Given:  Yes    Nathen May 08/25/2017, 10:13 AM

## 2017-08-25 NOTE — Progress Notes (Signed)
[facility]                                                                              Patient Demographics  Heather Walker, is a 56 y.o. female, DOB - 1961-09-29, OZD:[id number]  Admit date - 08/20/2017   Admitting Physician Etta Quill, DO  Outpatient Primary MD for the patient is Carollee Herter, Alferd Apa, DO  Outpatient specialists:   LOS - 4  days   Medical records reviewed and are as summarized below:    Chief Complaint  Patient presents with  . Code Sepsis  . Fever       Brief summary  56 y.o.BF PMHx DM Type 2 without complicationand HTN, Heart Murmur, Arthritis Presented to the ED with c/o severe persistent low back and RIGHThip pain for 2-3 days, worse with any movement or palpation or walking. Per EMS she was screaming in pain on arrival. MRI of the L spine and R hip in the ED were unremarkable. No overlying skin lesions were noted on exam.     Assessment & Plan    Principal Problem:   Sepsis (HCC)/MSSA bacteremia - Unclear source, MRI of the right hip and lumbar spine did not show any discitis, abscess - Blood cultures 11/4 positive for MSSA, repeat 11/6 are also positive for GPC staph, cultures repeated on 11/8 so far negative - TEE  showed no vegetations - CT abdomen showed no acute intra-abdominal pelvic abnormality. UA negative for UTI - Chest x-ray 11/4 with no active disease - ID following, currently on IV Ancef.  Recommended 4 weeks of IV antibiotics, PICC line and the blood cultures are cleared  Active Problems:   Essential hypertension - continue outpatient medications including Benicar, triamterene HCTZ, continue clonidine    Diabetes mellitus type II, uncontrolled (HCC) -Continue sliding scale insulin.  Hemoglobin A1c 9.7 in 04/2017 -HbA1c 8.9, needs tighter glycemic control outpatient    Acute right hip pain, back pain -Continue pain control, PT OT -No acute findings on the MRI of the lumbar spine and right  hip  Obesity - BMI 36, patient recommended diet and weight control   Code Status: Full code DVT Prophylaxis:  Lovenox  Family Communication: Discussed in detail with the patient, all imaging results, lab results explained to the patient    Disposition Plan:   Time Spent in minutes   25 minutes  Procedures:    Consultants:   cardiology ID   Antimicrobials:   IV Ancef   Medications  Scheduled Meds: . cloNIDine  0.2 mg Oral BID  . enoxaparin (LOVENOX) injection  40 mg Subcutaneous Q24H  . insulin aspart  0-15 Units Subcutaneous TID WC  . pantoprazole  40 mg Oral Daily   Continuous Infusions: .  ceFAZolin (ANCEF) IV 2 g (08/25/17 1158)   PRN Meds:.acetaminophen **OR** acetaminophen, albuterol, cyclobenzaprine, hydrALAZINE, ketorolac, ondansetron **OR** ondansetron (ZOFRAN) IV, oxyCODONE, sodium chloride flush, traZODone   Antibiotics   Anti-infectives (From admission, onward)   Start     Dose/Rate Route Frequency Ordered Stop   08/22/17 1945  ceFAZolin (ANCEF) IVPB 2g/100 mL premix     2 g 200 mL/hr  over 30 Minutes Intravenous Every 8 hours 08/22/17 1933     08/21/17 1700  ceFAZolin (ANCEF) IVPB 2g/100 mL premix  Status:  Discontinued     2 g 200 mL/hr over 30 Minutes Intravenous Every 8 hours 08/21/17 1443 08/22/17 1933   08/21/17 1500  ceFAZolin (ANCEF) IVPB 2g/100 mL premix  Status:  Discontinued     2 g 200 mL/hr over 30 Minutes Intravenous Every 8 hours 08/21/17 1439 08/21/17 1443   08/21/17 0400  vancomycin (VANCOCIN) IVPB 1000 mg/200 mL premix  Status:  Discontinued     1,000 mg 200 mL/hr over 60 Minutes Intravenous Every 12 hours 08/20/17 1752 08/21/17 1430   08/21/17 0000  piperacillin-tazobactam (ZOSYN) IVPB 3.375 g  Status:  Discontinued     3.375 g 12.5 mL/hr over 240 Minutes Intravenous Every 8 hours 08/20/17 1752 08/21/17 1430   08/20/17 1645  piperacillin-tazobactam (ZOSYN) IVPB 3.375 g     3.375 g 100 mL/hr over 30 Minutes Intravenous  Once  08/20/17 1640 08/20/17 1735   08/20/17 1645  vancomycin (VANCOCIN) IVPB 1000 mg/200 mL premix     1,000 mg 200 mL/hr over 60 Minutes Intravenous  Once 08/20/17 1640 08/20/17 1805        Subjective:   Heather Walker was seen and examined today.  Much more alert and awake today, back pain is improving.  No fevers or chills. Patient denies dizziness, chest pain, shortness of breath, abdominal pain, N/V/D/C, new weakness, numbess, tingling. No acute events overnight.    Objective:   Vitals:   08/24/17 1521 08/24/17 2100 08/24/17 2148 08/25/17 0522  BP: (!) 177/93  (!) 142/87 (!) 158/87  Pulse: 94  90 71  Resp:   20 19  Temp: 99.8 F (37.7 C)  98.7 F (37.1 C) 98.2 F (36.8 C)  TempSrc: Oral  Oral Oral  SpO2: 100% 100%  100%  Weight:      Height:        Intake/Output Summary (Last 24 hours) at 08/25/2017 1204 Last data filed at 08/25/2017 0430 Gross per 24 hour  Intake 400 ml  Output 3350 ml  Net -2950 ml     Wt Readings from Last 3 Encounters:  08/24/17 89.8 kg (198 lb)  08/20/17 85.7 kg (189 lb)  05/02/17 92.5 kg (204 lb)     Exam   General: Alert and oriented x 3, NAD  Eyes:   HEENT:    Cardiovascular: S1 S2 auscultated, no rubs, murmurs or gallops. Regular rate and rhythm. No pedal edema b/l  Respiratory: CTAB  Gastrointestinal: Soft, nontender, nondistended, + bowel sounds  Ext: no pedal edema bilaterally  Neuro: no new deficits  Musculoskeletal: No digital cyanosis, clubbing  Skin: No rashes  Psych: Normal affect and demeanor, alert and oriented x3     Data Reviewed:  I have personally reviewed following labs and imaging studies  Micro Results Recent Results (from the past 240 hour(s))  Blood Culture (routine x 2)     Status: Abnormal   Collection Time: 08/20/17  5:03 PM  Result Value Ref Range Status   Specimen Description BLOOD RIGHT ANTECUBITAL  Final   Special Requests   Final    BOTTLES DRAWN AEROBIC AND ANAEROBIC Blood Culture  adequate volume   Culture  Setup Time   Final    GRAM POSITIVE COCCI IN CLUSTERS IN BOTH AEROBIC AND ANAEROBIC BOTTLES CRITICAL RESULT CALLED TO, READ BACK BY AND VERIFIED WITH: AEdwina Barth [place], AT 1660 08/21/17 BY D. Victoriano Lain  Culture STAPHYLOCOCCUS AUREUS (A)  Final   Report Status 08/23/2017 FINAL  Final   Organism ID, Bacteria STAPHYLOCOCCUS AUREUS  Final      Susceptibility   Staphylococcus aureus - MIC*    CIPROFLOXACIN <=0.5 SENSITIVE Sensitive     ERYTHROMYCIN >=8 RESISTANT Resistant     GENTAMICIN <=0.5 SENSITIVE Sensitive     OXACILLIN <=0.25 SENSITIVE Sensitive     TETRACYCLINE <=1 SENSITIVE Sensitive     VANCOMYCIN <=0.5 SENSITIVE Sensitive     TRIMETH/SULFA <=10 SENSITIVE Sensitive     CLINDAMYCIN <=0.25 SENSITIVE Sensitive     RIFAMPIN <=0.5 SENSITIVE Sensitive     Inducible Clindamycin NEGATIVE Sensitive     * STAPHYLOCOCCUS AUREUS  Blood Culture ID Panel (Reflexed)     Status: Abnormal   Collection Time: 08/20/17  5:03 PM  Result Value Ref Range Status   Enterococcus species NOT DETECTED NOT DETECTED Final   Listeria monocytogenes NOT DETECTED NOT DETECTED Final   Staphylococcus species DETECTED (A) NOT DETECTED Final    Comment: CRITICAL RESULT CALLED TO, READ BACK BY AND VERIFIED WITH: A. JOHNSTON PHARMD, AT 1856 08/21/17 BY D. VANHOOK    Staphylococcus aureus DETECTED (A) NOT DETECTED Final    Comment: Methicillin (oxacillin) susceptible Staphylococcus aureus (MSSA). Preferred therapy is anti staphylococcal beta lactam antibiotic (Cefazolin or Nafcillin), unless clinically contraindicated. CRITICAL RESULT CALLED TO, READ BACK BY AND VERIFIED WITH: A. JOHNSTON PHARMD, AT 3149 08/21/17 BY D. VANHOOK    Methicillin resistance NOT DETECTED NOT DETECTED Final   Streptococcus species NOT DETECTED NOT DETECTED Final   Streptococcus agalactiae NOT DETECTED NOT DETECTED Final   Streptococcus pneumoniae NOT DETECTED NOT DETECTED Final   Streptococcus pyogenes NOT  DETECTED NOT DETECTED Final   Acinetobacter baumannii NOT DETECTED NOT DETECTED Final   Enterobacteriaceae species NOT DETECTED NOT DETECTED Final   Enterobacter cloacae complex NOT DETECTED NOT DETECTED Final   Escherichia coli NOT DETECTED NOT DETECTED Final   Klebsiella oxytoca NOT DETECTED NOT DETECTED Final   Klebsiella pneumoniae NOT DETECTED NOT DETECTED Final   Proteus species NOT DETECTED NOT DETECTED Final   Serratia marcescens NOT DETECTED NOT DETECTED Final   Haemophilus influenzae NOT DETECTED NOT DETECTED Final   Neisseria meningitidis NOT DETECTED NOT DETECTED Final   Pseudomonas aeruginosa NOT DETECTED NOT DETECTED Final   Candida albicans NOT DETECTED NOT DETECTED Final   Candida glabrata NOT DETECTED NOT DETECTED Final   Candida krusei NOT DETECTED NOT DETECTED Final   Candida parapsilosis NOT DETECTED NOT DETECTED Final   Candida tropicalis NOT DETECTED NOT DETECTED Final  Blood Culture (routine x 2)     Status: Abnormal   Collection Time: 08/20/17  5:05 PM  Result Value Ref Range Status   Specimen Description BLOOD RIGHT HAND  Final   Special Requests   Final    BOTTLES DRAWN AEROBIC ONLY Blood Culture adequate volume   Culture  Setup Time   Final    GRAM POSITIVE COCCI IN CLUSTERS AEROBIC BOTTLE ONLY CRITICAL VALUE NOTED.  VALUE IS CONSISTENT WITH PREVIOUSLY REPORTED AND CALLED VALUE.    Culture (A)  Final    STAPHYLOCOCCUS AUREUS SUSCEPTIBILITIES PERFORMED ON PREVIOUS CULTURE WITHIN THE LAST 5 DAYS.    Report Status 08/23/2017 FINAL  Final  Urine culture     Status: None   Collection Time: 08/20/17  6:53 PM  Result Value Ref Range Status   Specimen Description URINE, CATHETERIZED  Final   Special Requests NONE  Final   Culture  NO GROWTH  Final   Report Status 08/21/2017 FINAL  Final  Culture, blood (routine x 2)     Status: Abnormal   Collection Time: 08/22/17  4:45 AM  Result Value Ref Range Status   Specimen Description BLOOD LEFT HAND  Final    Special Requests IN PEDIATRIC BOTTLE Blood Culture adequate volume  Final   Culture  Setup Time   Final    GRAM POSITIVE COCCI IN CLUSTERS IN PEDIATRIC BOTTLE CRITICAL RESULT CALLED TO, READ BACK BY AND VERIFIED WITH: J.LEDFORD PHARMD 08/22/17 2343 L.CHAMPION    Culture (A)  Final    STAPHYLOCOCCUS AUREUS SUSCEPTIBILITIES PERFORMED ON PREVIOUS CULTURE WITHIN THE LAST 5 DAYS.    Report Status 08/24/2017 FINAL  Final  Culture, blood (routine x 2)     Status: Abnormal   Collection Time: 08/22/17  4:50 AM  Result Value Ref Range Status   Specimen Description BLOOD RIGHT HAND  Final   Special Requests IN PEDIATRIC BOTTLE Blood Culture adequate volume  Final   Culture  Setup Time   Final    GRAM POSITIVE COCCI IN CLUSTERS IN PEDIATRIC BOTTLE CRITICAL VALUE NOTED.  VALUE IS CONSISTENT WITH PREVIOUSLY REPORTED AND CALLED VALUE.    Culture (A)  Final    STAPHYLOCOCCUS AUREUS SUSCEPTIBILITIES PERFORMED ON PREVIOUS CULTURE WITHIN THE LAST 5 DAYS.    Report Status 08/24/2017 FINAL  Final    Radiology Reports Mr Lumbar Spine W Wo Contrast  Result Date: 08/20/2017 CLINICAL DATA:  Severe RIGHT hip pain radiating to back for 2 days. Back pain beginning last night. Sepsis. History of diabetes. EXAM: MRI LUMBAR SPINE WITHOUT AND WITH CONTRAST TECHNIQUE: Multiplanar and multiecho pulse sequences of the lumbar spine were obtained without and with intravenous contrast. CONTRAST:  67mL MULTIHANCE GADOBENATE DIMEGLUMINE 529 MG/ML IV SOLN COMPARISON:  None. FINDINGS: SEGMENTATION: For the purposes of this report, the last well-formed intervertebral disc will be reported as L5-S1. Transitional anatomy with small S1-2 disc, lumbarized S1 vertebral body. ALIGNMENT: Maintained lumbar lordosis. No malalignment. VERTEBRAE:Vertebral bodies are intact. Intervertebral discs demonstrate normal morphology, mild desiccation L5-S1 disc. Multilevel mild acute on chronic discogenic endplate changes. No abnormal osseous or  suspicious disc enhancement. CONUS MEDULLARIS: Conus medullaris terminates at L2-3 and demonstrates normal morphology and signal characteristics. 2 mm tubular T1 bright signal along the filum terminale with chemical shift artifact consistent with fibro lipomatosis changes. No abnormal cord, leptomeningeal enhancement. PARASPINAL AND SOFT TISSUES: Faint enhancement and bright interstitial STIR signal paraspinal muscles mid to lower lumbar spine. No focal fluid collection. DISC LEVELS: T12-L1 thru L2-3: No disc bulge, canal stenosis nor neural foraminal narrowing. L3-4: No disc bulge. Moderate RIGHT mild LEFT facet arthropathy without canal stenosis or neural foraminal narrowing. L4-5: Small RIGHT subarticular disc protrusion with faint enhancing annular fissure. Moderate RIGHT greater than LEFT facet arthropathy and ligamentum flavum redundancy. Trace RIGHT facet synovial cyst within dorsal epidural space. No canal stenosis. Mild RIGHT neural foraminal narrowing. L5-S1: Small LEFT subarticular disc protrusion. Central enhancing annular fissure. Severe facet arthropathy. No canal stenosis. Moderate RIGHT, moderate to severe LEFT neural foraminal narrowing. IMPRESSION: 1. No MR findings of discitis osteomyelitis. Faintly enhancing paraspinal muscles seen with low-grade strain, less likely myositis. 2. Small RIGHT L4-5 disc protrusion with annular fissure may be acute. 3. Degenerative change of the lumbar spine. No canal stenosis. Neural foraminal narrowing L4-5 and L5-S1: Moderate to severe on the LEFT at L5-S1. 4. Low-lying spinal cord with fibro lipomatosis changes of filum terminale, no cord  tethering. Electronically Signed   By: Elon Alas M.D.   On: 08/20/2017 22:43   Mr Hip Right W Wo Contrast  Result Date: 08/20/2017 CLINICAL DATA:  Right hip pain. No known injury. Suspect soft tissue infection. EXAM: MRI OF THE RIGHT HIP WITHOUT AND WITH CONTRAST TECHNIQUE: Multiplanar, multisequence MR imaging was  performed both before and after administration of intravenous contrast. CONTRAST:  42mL MULTIHANCE GADOBENATE DIMEGLUMINE 529 MG/ML IV SOLN COMPARISON:  Right hip radiographs 08/20/2017. MRI lumbar spine 11/16/2012 FINDINGS: Bones: No focal bone lesions identified. Bone cortex appears intact. Marrow signal intensities are homogeneous and normal. Articular cartilage and labrum Articular cartilage:  Appears homogeneous and intact. Labrum:  Appears intact. Joint or bursal effusion Joint effusion: Small joint effusions are present bilaterally and are symmetrical. Bursae:  No abnormal bursal collections. Muscles and tendons Muscles and tendons: Normal appearance. No abnormal signal intensity changes, expansile process, solid mass, or fluid collections identified. Other findings Miscellaneous: Visualized portions of the pelvic organs are unremarkable. IMPRESSION: No acute process identified. No evidence of acute fracture, bone contusion, or inflammatory change involving the right hip or musculature. Electronically Signed   By: Lucienne Capers M.D.   On: 08/20/2017 23:25   Ct Abdomen Pelvis W Contrast  Result Date: 08/21/2017 CLINICAL DATA:  Back pain EXAM: CT ABDOMEN AND PELVIS WITH CONTRAST TECHNIQUE: Multidetector CT imaging of the abdomen and pelvis was performed using the standard protocol following bolus administration of intravenous contrast. CONTRAST:  174mL ISOVUE-300 IOPAMIDOL (ISOVUE-300) INJECTION 61% COMPARISON:  08/20/2017, 11/10/2011 FINDINGS: Lower chest: Lung bases demonstrate patchy dependent atelectasis. No consolidation or pleural effusion. Heart size within normal limits. Hepatobiliary: Hepatic steatosis. Post cholecystectomy. Prominent extrahepatic bile duct likely due to postsurgical changes. Pancreas: Unremarkable. No pancreatic ductal dilatation or surrounding inflammatory changes. Spleen: Normal in size without focal abnormality. Adrenals/Urinary Tract: Adrenal glands are unremarkable.  Kidneys are normal, without renal calculi, focal lesion, or hydronephrosis. Bladder is unremarkable. Stomach/Bowel: Stomach is within normal limits. Status post appendectomy. No evidence of bowel wall thickening, distention, or inflammatory changes. Vascular/Lymphatic: Aortic atherosclerosis. No enlarged abdominal or pelvic lymph nodes. Reproductive: Status post hysterectomy. No adnexal masses. Other: Negative for free air or free fluid.  Fat in the umbilicus Musculoskeletal: Degenerative changes. No acute or suspicious bone lesion IMPRESSION: 1. No CT evidence for acute intra-abdominal or pelvic abnormality 2. Hepatic steatosis Electronically Signed   By: Donavan Foil M.D.   On: 08/21/2017 02:17   Dg Chest Port 1 View  Result Date: 08/20/2017 CLINICAL DATA:  Nodes sepsis.  Fever.  Shortness of breath. EXAM: PORTABLE CHEST 1 VIEW COMPARISON:  None. FINDINGS: The heart size and mediastinal contours are within normal limits. Both lungs are clear. The visualized skeletal structures are unremarkable. IMPRESSION: No active disease. Electronically Signed   By: Dorise Bullion III M.D   On: 08/20/2017 17:28   Dg Hip Unilat W Or Wo Pelvis 2-3 Views Right  Result Date: 08/20/2017 CLINICAL DATA:  Right hip pain.  No known injury. EXAM: DG HIP (WITH OR WITHOUT PELVIS) 2-3V RIGHT COMPARISON:  None. FINDINGS: There is no evidence of hip fracture or dislocation. There is no evidence of arthropathy or other focal bone abnormality. IMPRESSION: Normal examination. Electronically Signed   By: Claudie Revering M.D.   On: 08/20/2017 09:18    Lab Data:  CBC: Recent Labs  Lab 08/20/17 1640 08/22/17 0444 08/23/17 0410  WBC 11.4* 6.8 5.8  NEUTROABS 10.1*  --   --   HGB 12.7 10.0*  10.2*  HCT 37.7 30.2* 30.3*  MCV 83.2 82.3 80.6  PLT 155 110* 147*   Basic Metabolic Panel: Recent Labs  Lab 08/20/17 1640 08/22/17 0444 08/23/17 0410 08/24/17 0544 08/25/17 0304  NA 137 137 134* 137 137  K 2.8* 3.4* 3.8 3.6 3.0*   CL 100* 110 106 106 103  CO2 27 21* 22 24 28   GLUCOSE 212* 112* 132* 118* 148*  BUN 12 13 9 6 6   CREATININE 0.98 1.09* 0.96 0.84 0.73  CALCIUM 8.7* 7.4* 7.8* 8.0* 8.2*  MG  --  1.4*  --  1.7 1.5*   GFR: Estimated Creatinine Clearance: 83.8 mL/min (by C-G formula based on SCr of 0.73 mg/dL). Liver Function Tests: Recent Labs  Lab 08/20/17 1640 08/22/17 0444  AST 17 24  ALT 13* 15  ALKPHOS 77 69  BILITOT 0.8 0.5  PROT 7.3 5.6*  ALBUMIN 3.5 2.3*   Recent Labs  Lab 08/20/17 1640  LIPASE 24   No results for input(s): AMMONIA in the last 168 hours. Coagulation Profile: Recent Labs  Lab 08/24/17 0544  INR 1.05   Cardiac Enzymes: No results for input(s): CKTOTAL, CKMB, CKMBINDEX, TROPONINI in the last 168 hours. BNP (last 3 results) No results for input(s): PROBNP in the last 8760 hours. HbA1C: Recent Labs    08/25/17 0304  HGBA1C 8.9*   CBG: Recent Labs  Lab 08/24/17 0809 08/24/17 1209 08/24/17 1655 08/24/17 2145 08/25/17 0803  GLUCAP 97 85 75 152* 120*   Lipid Profile: No results for input(s): CHOL, HDL, LDLCALC, TRIG, CHOLHDL, LDLDIRECT in the last 72 hours. Thyroid Function Tests: No results for input(s): TSH, T4TOTAL, FREET4, T3FREE, THYROIDAB in the last 72 hours. Anemia Panel: No results for input(s): VITAMINB12, FOLATE, FERRITIN, TIBC, IRON, RETICCTPCT in the last 72 hours. Urine analysis:    Component Value Date/Time   COLORURINE YELLOW 08/20/2017 1841   APPEARANCEUR CLEAR 08/20/2017 1841   LABSPEC 1.010 08/20/2017 1841   PHURINE 6.0 08/20/2017 1841   GLUCOSEU >=500 (A) 08/20/2017 1841   HGBUR NEGATIVE 08/20/2017 1841   HGBUR negative 09/03/2007 1610   BILIRUBINUR NEGATIVE 08/20/2017 1841   BILIRUBINUR neg 05/09/2016 1324   KETONESUR NEGATIVE 08/20/2017 1841   PROTEINUR NEGATIVE 08/20/2017 1841   UROBILINOGEN 0.2 05/09/2016 1324   UROBILINOGEN 1.0 02/09/2014 1257   NITRITE NEGATIVE 08/20/2017 1841   LEUKOCYTESUR NEGATIVE 08/20/2017 1841      Ripudeep Rai M.D. [facility] 08/25/2017, 12:04 PM  Pager: [phone] Between 7am to 7pm - call Pager - [phone]  After 7pm go to [email or web address] - password [email or web address]  Call night coverage person covering after 7pm

## 2017-08-26 LAB — GLUCOSE, CAPILLARY
Glucose-Capillary: 108 mg/dL — ABNORMAL HIGH (ref 65–99)
Glucose-Capillary: 155 mg/dL — ABNORMAL HIGH (ref 65–99)
Glucose-Capillary: 108 mg/dL — ABNORMAL HIGH (ref 65–99)
Glucose-Capillary: 182 mg/dL — ABNORMAL HIGH (ref 65–99)

## 2017-08-26 LAB — BASIC METABOLIC PANEL
Anion gap: 6 (ref 5–15)
BUN: 6 mg/dL (ref 6–20)
CO2: 30 mmol/L (ref 22–32)
Calcium: 8.2 mg/dL — ABNORMAL LOW (ref 8.9–10.3)
Chloride: 101 mmol/L (ref 101–111)
Creatinine, Ser: 0.73 mg/dL (ref 0.44–1.00)
GFR calc Af Amer: >60 mL/min (ref >60)
GFR calc non Af Amer: >60 mL/min (ref >60)
Glucose, Bld: 125 mg/dL — ABNORMAL HIGH (ref 65–99)
Potassium: 2.7 mmol/L — CL (ref 3.5–5.1)
Sodium: 137 mmol/L (ref 135–145)

## 2017-08-26 LAB — MAGNESIUM: Magnesium: 1.4 mg/dL — ABNORMAL LOW (ref 1.7–2.4)

## 2017-08-26 MED ORDER — POLYETHYLENE GLYCOL 3350 17 G PO PACK
17.0000 g | PACK | Freq: Once | ORAL | Status: AC
  Administered 2017-08-26: 17 g via ORAL
  Filled 2017-08-26: qty 1

## 2017-08-26 MED ORDER — POTASSIUM CHLORIDE CRYS ER 20 MEQ PO TBCR
40.0000 meq | EXTENDED_RELEASE_TABLET | Freq: Two times a day (BID) | ORAL | Status: DC
  Administered 2017-08-26: 40 meq via ORAL
  Filled 2017-08-26: qty 2

## 2017-08-26 MED ORDER — POTASSIUM CHLORIDE 10 MEQ/100ML IV SOLN
10.0000 meq | INTRAVENOUS | Status: AC
Start: 2017-08-26 — End: 2017-08-26
  Administered 2017-08-26 (×2): 10 meq via INTRAVENOUS
  Filled 2017-08-26 (×2): qty 100

## 2017-08-26 MED ORDER — MAGNESIUM SULFATE 50 % IJ SOLN
3.0000 g | Freq: Once | INTRAMUSCULAR | Status: AC
  Administered 2017-08-26: 3 g via INTRAVENOUS
  Filled 2017-08-26: qty 6

## 2017-08-26 MED ORDER — POTASSIUM CHLORIDE CRYS ER 20 MEQ PO TBCR
40.0000 meq | EXTENDED_RELEASE_TABLET | ORAL | Status: AC
  Administered 2017-08-26 (×2): 40 meq via ORAL
  Filled 2017-08-26 (×2): qty 2

## 2017-08-26 NOTE — Progress Notes (Signed)
[facility]                                                                              Patient Demographics  Heather Walker, is a 56 y.o. female, DOB - [birth year], PIR:[id number]  Admit date - 08/20/2017   Admitting Physician Etta Quill, DO  Outpatient Primary MD for the patient is Carollee Herter, Alferd Apa, DO  Outpatient specialists:   LOS - 5  days   Medical records reviewed and are as summarized below:    Chief Complaint  Patient presents with  . Code Sepsis  . Fever       Brief summary  56 y.o.BF PMHx DM Type 2 without complicationand HTN, Heart Murmur, Arthritis Presented to the ED with c/o severe persistent low back and RIGHThip pain for 2-3 days, worse with any movement or palpation or walking. Per EMS she was screaming in pain on arrival. MRI of the L spine and R hip in the ED were unremarkable. No overlying skin lesions were noted on exam.     Assessment & Plan    Principal Problem:   Sepsis (HCC)/MSSA bacteremia - Unclear source, MRI of the right hip and lumbar spine did not show any discitis, abscess - Blood cultures 11/4 positive for MSSA, repeat 11/6 are also positive for GPC staph, cultures repeated on 11/8 so far negative - TEE  showed no vegetations - CT abdomen showed no acute intra-abdominal pelvic abnormality. UA negative for UTI - Chest x-ray 11/4 with no active disease - ID following, currently on IV Ancef.  Recommended 4 weeks of IV antibiotics, PICC line and the blood cultures are cleared -Will place PICC line on 11/11 tomorrow if blood cultures remain negative  Active Problems:   Essential hypertension - continue outpatient medications including Benicar, triamterene HCTZ, continue clonidine    Diabetes mellitus type II, uncontrolled (HCC) -Continue sliding scale insulin.  Hemoglobin A1c 9.7 in 04/2017 -HbA1c 8.9, needs tighter glycemic control outpatient    Acute right hip pain, back pain -Continue pain control, PT  OT -No acute findings on the MRI of the lumbar spine and right hip -Feels a lot better, will be able to DC with home health PT OT  Obesity - BMI 36, patient recommended diet and weight control  Constipation -Placed on MiraLAX   Code Status: Full code DVT Prophylaxis:  Lovenox  Family Communication: Discussed in detail with the patient, all imaging results, lab results explained to the patient    Disposition Plan: Tomorrow if no acute issues  Time Spent in minutes   25 minutes  Procedures:    Consultants:   cardiology ID   Antimicrobials:   IV Ancef   Medications  Scheduled Meds: . cloNIDine  0.2 mg Oral BID  . enoxaparin (LOVENOX) injection  40 mg Subcutaneous Q24H  . insulin aspart  0-15 Units Subcutaneous TID WC  . insulin glargine  5 Units Subcutaneous QHS  . pantoprazole  40 mg Oral Daily  . potassium chloride  40 mEq Oral Q4H   Continuous Infusions: .  ceFAZolin (ANCEF) IV Stopped (08/26/17 6301)  . magnesium sulfate LVP 250-500 ml 3  g (08/26/17 0923)   PRN Meds:.acetaminophen **OR** acetaminophen, albuterol, cyclobenzaprine, hydrALAZINE, ondansetron **OR** ondansetron (ZOFRAN) IV, oxyCODONE, sodium chloride flush, traZODone   Antibiotics   Anti-infectives (From admission, onward)   Start     Dose/Rate Route Frequency Ordered Stop   08/22/17 1945  ceFAZolin (ANCEF) IVPB 2g/100 mL premix     2 g 200 mL/hr over 30 Minutes Intravenous Every 8 hours 08/22/17 1933     08/21/17 1700  ceFAZolin (ANCEF) IVPB 2g/100 mL premix  Status:  Discontinued     2 g 200 mL/hr over 30 Minutes Intravenous Every 8 hours 08/21/17 1443 08/22/17 1933   08/21/17 1500  ceFAZolin (ANCEF) IVPB 2g/100 mL premix  Status:  Discontinued     2 g 200 mL/hr over 30 Minutes Intravenous Every 8 hours 08/21/17 1439 08/21/17 1443   08/21/17 0400  vancomycin (VANCOCIN) IVPB 1000 mg/200 mL premix  Status:  Discontinued     1,000 mg 200 mL/hr over 60 Minutes Intravenous Every 12 hours  08/20/17 1752 08/21/17 1430   08/21/17 0000  piperacillin-tazobactam (ZOSYN) IVPB 3.375 g  Status:  Discontinued     3.375 g 12.5 mL/hr over 240 Minutes Intravenous Every 8 hours 08/20/17 1752 08/21/17 1430   08/20/17 1645  piperacillin-tazobactam (ZOSYN) IVPB 3.375 g     3.375 g 100 mL/hr over 30 Minutes Intravenous  Once 08/20/17 1640 08/20/17 1735   08/20/17 1645  vancomycin (VANCOCIN) IVPB 1000 mg/200 mL premix     1,000 mg 200 mL/hr over 60 Minutes Intravenous  Once 08/20/17 1640 08/20/17 1805        Subjective:   Heather Walker was seen and examined today.  Feels a lot better, back pain is controlled, no fevers.   Patient denies dizziness, chest pain, shortness of breath, abdominal pain, N/V/D/C, new weakness, numbess, tingling. No acute events overnight.  + Constipation  Objective:   Vitals:   08/26/17 0149 08/26/17 0511 08/26/17 0649 08/26/17 0819  BP: (!) 167/83 (!) 170/87 (!) 156/74   Pulse: 63 69 70   Resp: 20 19    Temp: 98.6 F (37 C) (!) 97.5 F (36.4 C)    TempSrc: Oral Oral    SpO2: 96% 97%  96%  Weight:      Height:        Intake/Output Summary (Last 24 hours) at 08/26/2017 1134 Last data filed at 08/26/2017 0721 Gross per 24 hour  Intake 638 ml  Output 1650 ml  Net -1012 ml     Wt Readings from Last 3 Encounters:  08/24/17 89.8 kg (198 lb)  08/20/17 85.7 kg (189 lb)  05/02/17 92.5 kg (204 lb)     Exam  General: Alert and oriented x 3, NAD, comfortable  Eyes:  HEENT:   Cardiovascular: S1 S2 clear, RRR. No pedal edema b/l Respiratory: CTAB Gastrointestinal: Soft, nontender, nondistended, + bowel sounds Ext: no pedal edema bilaterally Neuro: no new deficits Musculoskeletal: No digital cyanosis, clubbing Skin: No rashes Psych: Normal affect and demeanor, alert and oriented x3       Data Reviewed:  I have personally reviewed following labs and imaging studies  Micro Results Recent Results (from the past 240 hour(s))  Blood Culture  (routine x 2)     Status: Abnormal   Collection Time: 08/20/17  5:03 PM  Result Value Ref Range Status   Specimen Description BLOOD RIGHT ANTECUBITAL  Final   Special Requests   Final    BOTTLES DRAWN AEROBIC AND ANAEROBIC Blood Culture adequate  volume   Culture  Setup Time   Final    GRAM POSITIVE COCCI IN CLUSTERS IN BOTH AEROBIC AND ANAEROBIC BOTTLES CRITICAL RESULT CALLED TO, READ BACK BY AND VERIFIED WITH: A. JOHNSTON PHARMD, AT 0748 08/21/17 BY D. VANHOOK    Culture STAPHYLOCOCCUS AUREUS (A)  Final   Report Status 08/23/2017 FINAL  Final   Organism ID, Bacteria STAPHYLOCOCCUS AUREUS  Final      Susceptibility   Staphylococcus aureus - MIC*    CIPROFLOXACIN <=0.5 SENSITIVE Sensitive     ERYTHROMYCIN >=8 RESISTANT Resistant     GENTAMICIN <=0.5 SENSITIVE Sensitive     OXACILLIN <=0.25 SENSITIVE Sensitive     TETRACYCLINE <=1 SENSITIVE Sensitive     VANCOMYCIN <=0.5 SENSITIVE Sensitive     TRIMETH/SULFA <=10 SENSITIVE Sensitive     CLINDAMYCIN <=0.25 SENSITIVE Sensitive     RIFAMPIN <=0.5 SENSITIVE Sensitive     Inducible Clindamycin NEGATIVE Sensitive     * STAPHYLOCOCCUS AUREUS  Blood Culture ID Panel (Reflexed)     Status: Abnormal   Collection Time: 08/20/17  5:03 PM  Result Value Ref Range Status   Enterococcus species NOT DETECTED NOT DETECTED Final   Listeria monocytogenes NOT DETECTED NOT DETECTED Final   Staphylococcus species DETECTED (A) NOT DETECTED Final    Comment: CRITICAL RESULT CALLED TO, READ BACK BY AND VERIFIED WITH: A. JOHNSTON PHARMD, AT 5361 08/21/17 BY D. VANHOOK    Staphylococcus aureus DETECTED (A) NOT DETECTED Final    Comment: Methicillin (oxacillin) susceptible Staphylococcus aureus (MSSA). Preferred therapy is anti staphylococcal beta lactam antibiotic (Cefazolin or Nafcillin), unless clinically contraindicated. CRITICAL RESULT CALLED TO, READ BACK BY AND VERIFIED WITH: A. JOHNSTON PHARMD, AT 4431 08/21/17 BY D. VANHOOK    Methicillin resistance  NOT DETECTED NOT DETECTED Final   Streptococcus species NOT DETECTED NOT DETECTED Final   Streptococcus agalactiae NOT DETECTED NOT DETECTED Final   Streptococcus pneumoniae NOT DETECTED NOT DETECTED Final   Streptococcus pyogenes NOT DETECTED NOT DETECTED Final   Acinetobacter baumannii NOT DETECTED NOT DETECTED Final   Enterobacteriaceae species NOT DETECTED NOT DETECTED Final   Enterobacter cloacae complex NOT DETECTED NOT DETECTED Final   Escherichia coli NOT DETECTED NOT DETECTED Final   Klebsiella oxytoca NOT DETECTED NOT DETECTED Final   Klebsiella pneumoniae NOT DETECTED NOT DETECTED Final   Proteus species NOT DETECTED NOT DETECTED Final   Serratia marcescens NOT DETECTED NOT DETECTED Final   Haemophilus influenzae NOT DETECTED NOT DETECTED Final   Neisseria meningitidis NOT DETECTED NOT DETECTED Final   Pseudomonas aeruginosa NOT DETECTED NOT DETECTED Final   Candida albicans NOT DETECTED NOT DETECTED Final   Candida glabrata NOT DETECTED NOT DETECTED Final   Candida krusei NOT DETECTED NOT DETECTED Final   Candida parapsilosis NOT DETECTED NOT DETECTED Final   Candida tropicalis NOT DETECTED NOT DETECTED Final  Blood Culture (routine x 2)     Status: Abnormal   Collection Time: 08/20/17  5:05 PM  Result Value Ref Range Status   Specimen Description BLOOD RIGHT HAND  Final   Special Requests   Final    BOTTLES DRAWN AEROBIC ONLY Blood Culture adequate volume   Culture  Setup Time   Final    GRAM POSITIVE COCCI IN CLUSTERS AEROBIC BOTTLE ONLY CRITICAL VALUE NOTED.  VALUE IS CONSISTENT WITH PREVIOUSLY REPORTED AND CALLED VALUE.    Culture (A)  Final    STAPHYLOCOCCUS AUREUS SUSCEPTIBILITIES PERFORMED ON PREVIOUS CULTURE WITHIN THE LAST 5 DAYS.    Report  Status 08/23/2017 FINAL  Final  Urine culture     Status: None   Collection Time: 08/20/17  6:53 PM  Result Value Ref Range Status   Specimen Description URINE, CATHETERIZED  Final   Special Requests NONE  Final    Culture NO GROWTH  Final   Report Status 08/21/2017 FINAL  Final  Culture, blood (routine x 2)     Status: Abnormal   Collection Time: 08/22/17  4:45 AM  Result Value Ref Range Status   Specimen Description BLOOD LEFT HAND  Final   Special Requests IN PEDIATRIC BOTTLE Blood Culture adequate volume  Final   Culture  Setup Time   Final    GRAM POSITIVE COCCI IN CLUSTERS IN PEDIATRIC BOTTLE CRITICAL RESULT CALLED TO, READ BACK BY AND VERIFIED WITH: J.LEDFORD PHARMD 08/22/17 2343 L.CHAMPION    Culture (A)  Final    STAPHYLOCOCCUS AUREUS SUSCEPTIBILITIES PERFORMED ON PREVIOUS CULTURE WITHIN THE LAST 5 DAYS.    Report Status 08/24/2017 FINAL  Final  Culture, blood (routine x 2)     Status: Abnormal   Collection Time: 08/22/17  4:50 AM  Result Value Ref Range Status   Specimen Description BLOOD RIGHT HAND  Final   Special Requests IN PEDIATRIC BOTTLE Blood Culture adequate volume  Final   Culture  Setup Time   Final    GRAM POSITIVE COCCI IN CLUSTERS IN PEDIATRIC BOTTLE CRITICAL VALUE NOTED.  VALUE IS CONSISTENT WITH PREVIOUSLY REPORTED AND CALLED VALUE.    Culture (A)  Final    STAPHYLOCOCCUS AUREUS SUSCEPTIBILITIES PERFORMED ON PREVIOUS CULTURE WITHIN THE LAST 5 DAYS.    Report Status 08/24/2017 FINAL  Final  Culture, blood (routine x 2)     Status: None (Preliminary result)   Collection Time: 08/24/17 12:36 PM  Result Value Ref Range Status   Specimen Description BLOOD LEFT ANTECUBITAL  Final   Special Requests IN PEDIATRIC BOTTLE Blood Culture adequate volume  Final   Culture NO GROWTH 1 DAY  Final   Report Status PENDING  Incomplete  Culture, blood (routine x 2)     Status: None (Preliminary result)   Collection Time: 08/24/17 12:40 PM  Result Value Ref Range Status   Specimen Description BLOOD BLOOD LEFT HAND  Final   Special Requests IN PEDIATRIC BOTTLE Blood Culture adequate volume  Final   Culture NO GROWTH 1 DAY  Final   Report Status PENDING  Incomplete    Radiology  Reports Mr Lumbar Spine W Wo Contrast  Result Date: 08/20/2017 CLINICAL DATA:  Severe RIGHT hip pain radiating to back for 2 days. Back pain beginning last night. Sepsis. History of diabetes. EXAM: MRI LUMBAR SPINE WITHOUT AND WITH CONTRAST TECHNIQUE: Multiplanar and multiecho pulse sequences of the lumbar spine were obtained without and with intravenous contrast. CONTRAST:  82mL MULTIHANCE GADOBENATE DIMEGLUMINE 529 MG/ML IV SOLN COMPARISON:  None. FINDINGS: SEGMENTATION: For the purposes of this report, the last well-formed intervertebral disc will be reported as L5-S1. Transitional anatomy with small S1-2 disc, lumbarized S1 vertebral body. ALIGNMENT: Maintained lumbar lordosis. No malalignment. VERTEBRAE:Vertebral bodies are intact. Intervertebral discs demonstrate normal morphology, mild desiccation L5-S1 disc. Multilevel mild acute on chronic discogenic endplate changes. No abnormal osseous or suspicious disc enhancement. CONUS MEDULLARIS: Conus medullaris terminates at L2-3 and demonstrates normal morphology and signal characteristics. 2 mm tubular T1 bright signal along the filum terminale with chemical shift artifact consistent with fibro lipomatosis changes. No abnormal cord, leptomeningeal enhancement. PARASPINAL AND SOFT TISSUES: Faint enhancement and bright  interstitial STIR signal paraspinal muscles mid to lower lumbar spine. No focal fluid collection. DISC LEVELS: T12-L1 thru L2-3: No disc bulge, canal stenosis nor neural foraminal narrowing. L3-4: No disc bulge. Moderate RIGHT mild LEFT facet arthropathy without canal stenosis or neural foraminal narrowing. L4-5: Small RIGHT subarticular disc protrusion with faint enhancing annular fissure. Moderate RIGHT greater than LEFT facet arthropathy and ligamentum flavum redundancy. Trace RIGHT facet synovial cyst within dorsal epidural space. No canal stenosis. Mild RIGHT neural foraminal narrowing. L5-S1: Small LEFT subarticular disc protrusion. Central  enhancing annular fissure. Severe facet arthropathy. No canal stenosis. Moderate RIGHT, moderate to severe LEFT neural foraminal narrowing. IMPRESSION: 1. No MR findings of discitis osteomyelitis. Faintly enhancing paraspinal muscles seen with low-grade strain, less likely myositis. 2. Small RIGHT L4-5 disc protrusion with annular fissure may be acute. 3. Degenerative change of the lumbar spine. No canal stenosis. Neural foraminal narrowing L4-5 and L5-S1: Moderate to severe on the LEFT at L5-S1. 4. Low-lying spinal cord with fibro lipomatosis changes of filum terminale, no cord tethering. Electronically Signed   By: Elon Alas M.D.   On: 08/20/2017 22:43   Mr Hip Right W Wo Contrast  Result Date: 08/20/2017 CLINICAL DATA:  Right hip pain. No known injury. Suspect soft tissue infection. EXAM: MRI OF THE RIGHT HIP WITHOUT AND WITH CONTRAST TECHNIQUE: Multiplanar, multisequence MR imaging was performed both before and after administration of intravenous contrast. CONTRAST:  48mL MULTIHANCE GADOBENATE DIMEGLUMINE 529 MG/ML IV SOLN COMPARISON:  Right hip radiographs 08/20/2017. MRI lumbar spine 11/16/2012 FINDINGS: Bones: No focal bone lesions identified. Bone cortex appears intact. Marrow signal intensities are homogeneous and normal. Articular cartilage and labrum Articular cartilage:  Appears homogeneous and intact. Labrum:  Appears intact. Joint or bursal effusion Joint effusion: Small joint effusions are present bilaterally and are symmetrical. Bursae:  No abnormal bursal collections. Muscles and tendons Muscles and tendons: Normal appearance. No abnormal signal intensity changes, expansile process, solid mass, or fluid collections identified. Other findings Miscellaneous: Visualized portions of the pelvic organs are unremarkable. IMPRESSION: No acute process identified. No evidence of acute fracture, bone contusion, or inflammatory change involving the right hip or musculature. Electronically Signed    By: Lucienne Capers M.D.   On: 08/20/2017 23:25   Ct Abdomen Pelvis W Contrast  Result Date: 08/21/2017 CLINICAL DATA:  Back pain EXAM: CT ABDOMEN AND PELVIS WITH CONTRAST TECHNIQUE: Multidetector CT imaging of the abdomen and pelvis was performed using the standard protocol following bolus administration of intravenous contrast. CONTRAST:  123mL ISOVUE-300 IOPAMIDOL (ISOVUE-300) INJECTION 61% COMPARISON:  08/20/2017, 11/10/2011 FINDINGS: Lower chest: Lung bases demonstrate patchy dependent atelectasis. No consolidation or pleural effusion. Heart size within normal limits. Hepatobiliary: Hepatic steatosis. Post cholecystectomy. Prominent extrahepatic bile duct likely due to postsurgical changes. Pancreas: Unremarkable. No pancreatic ductal dilatation or surrounding inflammatory changes. Spleen: Normal in size without focal abnormality. Adrenals/Urinary Tract: Adrenal glands are unremarkable. Kidneys are normal, without renal calculi, focal lesion, or hydronephrosis. Bladder is unremarkable. Stomach/Bowel: Stomach is within normal limits. Status post appendectomy. No evidence of bowel wall thickening, distention, or inflammatory changes. Vascular/Lymphatic: Aortic atherosclerosis. No enlarged abdominal or pelvic lymph nodes. Reproductive: Status post hysterectomy. No adnexal masses. Other: Negative for free air or free fluid.  Fat in the umbilicus Musculoskeletal: Degenerative changes. No acute or suspicious bone lesion IMPRESSION: 1. No CT evidence for acute intra-abdominal or pelvic abnormality 2. Hepatic steatosis Electronically Signed   By: Donavan Foil M.D.   On: 08/21/2017 02:17   Dg Chest  Port 1 View  Result Date: 08/20/2017 CLINICAL DATA:  Nodes sepsis.  Fever.  Shortness of breath. EXAM: PORTABLE CHEST 1 VIEW COMPARISON:  None. FINDINGS: The heart size and mediastinal contours are within normal limits. Both lungs are clear. The visualized skeletal structures are unremarkable. IMPRESSION: No active  disease. Electronically Signed   By: Dorise Bullion III M.D   On: 08/20/2017 17:28   Dg Hip Unilat W Or Wo Pelvis 2-3 Views Right  Result Date: 08/20/2017 CLINICAL DATA:  Right hip pain.  No known injury. EXAM: DG HIP (WITH OR WITHOUT PELVIS) 2-3V RIGHT COMPARISON:  None. FINDINGS: There is no evidence of hip fracture or dislocation. There is no evidence of arthropathy or other focal bone abnormality. IMPRESSION: Normal examination. Electronically Signed   By: Claudie Revering M.D.   On: 08/20/2017 09:18    Lab Data:  CBC: Recent Labs  Lab 08/20/17 1640 08/22/17 0444 08/23/17 0410  WBC 11.4* 6.8 5.8  NEUTROABS 10.1*  --   --   HGB 12.7 10.0* 10.2*  HCT 37.7 30.2* 30.3*  MCV 83.2 82.3 80.6  PLT 155 110* 063*   Basic Metabolic Panel: Recent Labs  Lab 08/22/17 0444 08/23/17 0410 08/24/17 0544 08/25/17 0304 08/26/17 0342  NA 137 134* 137 137 137  K 3.4* 3.8 3.6 3.0* 2.7*  CL 110 106 106 103 101  CO2 21* 22 24 28 30   GLUCOSE 112* 132* 118* 148* 125*  BUN 13 9 6 6 6   CREATININE 1.09* 0.96 0.84 0.73 0.73  CALCIUM 7.4* 7.8* 8.0* 8.2* 8.2*  MG 1.4*  --  1.7 1.5* 1.4*   GFR: Estimated Creatinine Clearance: 83.8 mL/min (by C-G formula based on SCr of 0.73 mg/dL). Liver Function Tests: Recent Labs  Lab 08/20/17 1640 08/22/17 0444  AST 17 24  ALT 13* 15  ALKPHOS 77 69  BILITOT 0.8 0.5  PROT 7.3 5.6*  ALBUMIN 3.5 2.3*   Recent Labs  Lab 08/20/17 1640  LIPASE 24   No results for input(s): AMMONIA in the last 168 hours. Coagulation Profile: Recent Labs  Lab 08/24/17 0544  INR 1.05   Cardiac Enzymes: No results for input(s): CKTOTAL, CKMB, CKMBINDEX, TROPONINI in the last 168 hours. BNP (last 3 results) No results for input(s): PROBNP in the last 8760 hours. HbA1C: Recent Labs    08/25/17 0304  HGBA1C 8.9*   CBG: Recent Labs  Lab 08/25/17 0803 08/25/17 1230 08/25/17 1625 08/25/17 2125 08/26/17 0753  GLUCAP 120* 136* 123* 164* 108*   Lipid Profile: No  results for input(s): CHOL, HDL, LDLCALC, TRIG, CHOLHDL, LDLDIRECT in the last 72 hours. Thyroid Function Tests: No results for input(s): TSH, T4TOTAL, FREET4, T3FREE, THYROIDAB in the last 72 hours. Anemia Panel: No results for input(s): VITAMINB12, FOLATE, FERRITIN, TIBC, IRON, RETICCTPCT in the last 72 hours. Urine analysis:    Component Value Date/Time   COLORURINE YELLOW 08/20/2017 1841   APPEARANCEUR CLEAR 08/20/2017 1841   LABSPEC 1.010 08/20/2017 1841   PHURINE 6.0 08/20/2017 1841   GLUCOSEU >=500 (A) 08/20/2017 1841   HGBUR NEGATIVE 08/20/2017 1841   HGBUR negative 09/03/2007 1610   BILIRUBINUR NEGATIVE 08/20/2017 1841   BILIRUBINUR neg 05/09/2016 1324   KETONESUR NEGATIVE 08/20/2017 1841   PROTEINUR NEGATIVE 08/20/2017 1841   UROBILINOGEN 0.2 05/09/2016 1324   UROBILINOGEN 1.0 02/09/2014 1257   NITRITE NEGATIVE 08/20/2017 1841   LEUKOCYTESUR NEGATIVE 08/20/2017 1841     Geovany Trudo M.D. [facility] 08/26/2017, 11:34 AM  Pager: [phone] Between 7am  to 7pm - call Pager - [phone]  After 7pm go to [email or web address] - password [email or web address]  Call night coverage person covering after 7pm

## 2017-08-26 NOTE — Progress Notes (Signed)
CRITICAL VALUE ALERT  Critical Value:  K- 2.7  Date & Time Notied:  08/26/17 05:05  Provider Notified: Dr. Kennon Holter    Orders Received/Actions taken: PO Potassium BID ordered

## 2017-08-26 NOTE — Progress Notes (Signed)
[facility]  [facility] is providing [place] [facility] services for Mrs. Pomales at [place].   [facility] is prepared for possible DC on Sunday as mentioned by pt.    [facility] will need IV ABX script for home to dispense at DC.   Teaching provided with pt for home IV ABX and daughter and husband will be primary caregivers to administered IV ABX at home.  If patient discharges after hours, please call [phone].   Larry Sierras 08/26/2017, 7:30 AM

## 2017-08-27 DIAGNOSIS — M25511 Pain in right shoulder: Secondary | ICD-10-CM

## 2017-08-27 DIAGNOSIS — G8929 Other chronic pain: Secondary | ICD-10-CM

## 2017-08-27 LAB — BASIC METABOLIC PANEL
Anion gap: 9 (ref 5–15)
BUN: 6 mg/dL (ref 6–20)
CO2: 26 mmol/L (ref 22–32)
Calcium: 8.2 mg/dL — ABNORMAL LOW (ref 8.9–10.3)
Chloride: 100 mmol/L — ABNORMAL LOW (ref 101–111)
Creatinine, Ser: 0.74 mg/dL (ref 0.44–1.00)
GFR calc Af Amer: >60 mL/min (ref >60)
GFR calc non Af Amer: >60 mL/min (ref >60)
Glucose, Bld: 157 mg/dL — ABNORMAL HIGH (ref 65–99)
Potassium: 3.3 mmol/L — ABNORMAL LOW (ref 3.5–5.1)
Sodium: 135 mmol/L (ref 135–145)

## 2017-08-27 LAB — GLUCOSE, CAPILLARY
Glucose-Capillary: 129 mg/dL — ABNORMAL HIGH (ref 65–99)
Glucose-Capillary: 124 mg/dL — ABNORMAL HIGH (ref 65–99)
Glucose-Capillary: 100 mg/dL — ABNORMAL HIGH (ref 65–99)
Glucose-Capillary: 120 mg/dL — ABNORMAL HIGH (ref 65–99)

## 2017-08-27 LAB — MAGNESIUM: Magnesium: 1.7 mg/dL (ref 1.7–2.4)

## 2017-08-27 MED ORDER — SODIUM CHLORIDE 0.9% FLUSH: 10.0000 mL | INTRAVENOUS | Status: DC | PRN

## 2017-08-27 MED ORDER — CYCLOBENZAPRINE HCL 5 MG PO TABS: 5.0000 mg | ORAL_TABLET | Freq: Three times a day (TID) | ORAL | 1 refills | Status: DC | PRN

## 2017-08-27 MED ORDER — CEFAZOLIN IV (FOR PTA / DISCHARGE USE ONLY): 2.0000 g | Freq: Three times a day (TID) | INTRAVENOUS | 0 refills | Status: DC

## 2017-08-27 MED ORDER — POTASSIUM CHLORIDE CRYS ER 20 MEQ PO TBCR
40.0000 meq | EXTENDED_RELEASE_TABLET | Freq: Once | ORAL | Status: AC
  Administered 2017-08-27: 40 meq via ORAL
  Filled 2017-08-27: qty 2

## 2017-08-27 MED ORDER — TRAMADOL HCL 50 MG PO TABS: 50.0000 mg | ORAL_TABLET | Freq: Four times a day (QID) | ORAL | 0 refills | Status: DC | PRN

## 2017-08-27 MED ORDER — PHENOL 1.4 % MT LIQD
1.0000 | OROMUCOSAL | Status: DC | PRN
  Administered 2017-08-27: 1 via OROMUCOSAL
  Filled 2017-08-27: qty 177

## 2017-08-27 NOTE — Plan of Care (Signed)
Patient progressing 

## 2017-08-27 NOTE — Care Management Note (Addendum)
Case Management Note  Patient Details  Name: Heather Walker MRN: [id number] Date of Birth: 21-Apr-1961  Subjective/Objective:  Pt DOES NOT HAVE PICC, is to be discharged  Home with IV Abx with [facility]. All DME in room. Rxs have been signed Last dose in hospital should be 12ish. If PICC placed. CM has spoken with Brenton Grills, Rep for that agency to prepare for discharge later today. Carolynn Sayers, IV specialist has been in close contact with this family, educating them in preparation for discharge today.                   Action/Plan:CM will sign off for now but will be available should additional discharge needs arise or disposition change.    Expected Discharge Date:  08/27/17               Expected Discharge Plan:  [place]  In-House Referral:     Discharge planning Services  CM Consult  Post Acute Care Choice:    Choice offered to:  Patient  DME Arranged:  IV pump/equipment, Gilford Rile, 3-N-1 DME Agency:  [place] Arranged:  RN, PT, OT [facility] Agency:  [facility] [place]  Status of Service:  Completed, signed off  If discussed at [place] of Stay Meetings, dates discussed:    Additional Comments:  [name], RN 08/27/2017, 9:07 AM

## 2017-08-27 NOTE — Discharge Summary (Signed)
Physician Discharge Summary   Patient ID: Heather Walker MRN: [id number] DOB/AGE: [birth year] 54 y.o.  Admit date: 08/20/2017 Discharge date: 08/27/2017  Primary Care Physician:  [name], DO  Discharge Diagnoses:    . Sepsis ([place]) . Acute right hip pain . Diabetes mellitus type II, uncontrolled ([place]) . Essential hypertension   Consults: Infectious disease   Recommendations for Outpatient Follow-up:  1. Home health PT, OT, RN to be arranged by the case management 2. Please repeat CBC/BMET at next visit 3. Please follow blood cultures till final 4. Patient was placed on cefazolin for 4 weeks, through December 5 and then stop.    DIET: Carb modified diet    Allergies:   Allergies  Allergen Reactions  . Atorvastatin Other (See Comments)    Neck stiffness  . Codeine Itching     DISCHARGE MEDICATIONS: Current Discharge Medication List    START taking these medications   Details  ceFAZolin (ANCEF) IVPB Inject 2 g every 8 (eight) hours into the vein. Indication:  MSSA bacteremia Last Day of Therapy:  09/21/17 - or 4 weeks from negative blood cultures (08/24/17 cultures negative to date) Labs - Once weekly:  CBC/D and BMP, Labs - Every other week:  ESR and CRP Qty: 78 Units, Refills: 0      CONTINUE these medications which have CHANGED   Details  cyclobenzaprine (FLEXERIL) 5 MG tablet Take 1 tablet (5 mg total) 3 (three) times daily as needed by mouth for muscle spasms. Qty: 30 tablet, Refills: 1   Associated Diagnoses: Chronic right shoulder pain    traMADol (ULTRAM) 50 MG tablet Take 1 tablet (50 mg total) every 6 (six) hours as needed by mouth for moderate pain or severe pain. Qty: 30 tablet, Refills: 0   Associated Diagnoses: Chronic right shoulder pain      CONTINUE these medications which have NOT CHANGED   Details  albuterol (PROVENTIL HFA;VENTOLIN HFA) 108 (90 BASE) MCG/ACT inhaler Inhale 2 puffs into the lungs every 6 (six) hours as needed.  For shortness of breath. Qty: 1 Inhaler, Refills: 4   Associated Diagnoses: Asthma, chronic, unspecified asthma severity, uncomplicated    albuterol (PROVENTIL) (2.5 MG/3ML) 0.083% nebulizer solution Take 3 mLs (2.5 mg total) by nebulization every 6 (six) hours as needed for wheezing or shortness of breath. Qty: 150 mL, Refills: 1    cloNIDine (CATAPRES) 0.2 MG tablet Take 1 tablet (0.2 mg total) by mouth 2 (two) times daily. Qty: 180 tablet, Refills: 1   Associated Diagnoses: Essential hypertension    empagliflozin (JARDIANCE) 10 MG TABS tablet Take 10 mg by mouth daily. Qty: 30 tablet, Refills: 3    olmesartan (BENICAR) 20 MG tablet Take 1 tablet (20 mg total) by mouth daily. Qty: 30 tablet, Refills: 2   Associated Diagnoses: Essential hypertension    Potassium Chloride ER 20 MEQ TBCR Take 40 mEq by mouth daily. Qty: 60 tablet, Refills: 2   Associated Diagnoses: Essential hypertension    saxagliptin HCl (ONGLYZA) 5 MG TABS tablet Take 1 tablet (5 mg total) by mouth daily. Qty: 30 tablet, Refills: 2   Associated Diagnoses: Type 2 diabetes mellitus with complication, unspecified long term insulin use status    traZODone (DESYREL) 50 MG tablet Take 0.5-1 tablets (25-50 mg total) by mouth at bedtime as needed for sleep. Qty: 30 tablet, Refills: 3   Associated Diagnoses: Insomnia    triamterene-hydrochlorothiazide (MAXZIDE) 75-50 MG tablet Take 1 tablet by mouth daily. Qty: 90 tablet, Refills:  0   Associated Diagnoses: Essential hypertension    omeprazole (PRILOSEC) 20 MG capsule Take 1 capsule (20 mg total) by mouth daily. Qty: 30 capsule, Refills: 3   Associated Diagnoses: Gastroesophageal reflux disease, esophagitis presence not specified         Brief H and P: For complete details please refer to admission H and P, but in brief 56 y.o.BFPMHx [id number] complicationand HTN, Heart Murmur,Arthritis Presented to the ED with c/o severe persistent low back  andRIGHThip pain for 2-3 days, worse with any movement or palpation or walking. Per EMS she was screaming in pain on arrival. MRI of the L spine and R hip in the ED were unremarkable. No overlying skin lesions were noted on exam.  Hospital Course:  Sepsis (HCC)/MSSA bacteremia - Unclear source, MRI of the right hip and lumbar spine did not show any discitis, abscess - Blood cultures 11/4 positive for MSSA, repeat 11/6 are also positive for GPC staph, cultures repeated on 11/8 so far negative - TEE  showed no vegetations - CT abdomen showed no acute intra-abdominal pelvic abnormality. UA negative for UTI - Chest x-ray 11/4 with no active disease - ID was consulted and followed closely.  Recommended IV Ancef for 4 weeks, PICC line was placed.  Home health RN was arranged  Active Problems:   Essential hypertension - continue outpatient medications including Benicar, triamterene HCTZ, continue clonidine    Diabetes mellitus type II, uncontrolled (HCC) -Continue sliding scale insulin.  Hemoglobin A1c 9.7 in 04/2017 -HbA1c 8.9, needs tighter glycemic control outpatient    Acute right hip pain, back pain -Continue pain control, PT OT -No acute findings on the MRI of the lumbar spine and right hip -Feels a lot better, will be able to DC with home health PT OT  Obesity - BMI 36, patient recommended diet and weight control  Constipation -Placed on MiraLAX     Day of Discharge BP (!) 167/95 (BP Location: Left Arm)   Pulse 75   Temp 98.5 F (36.9 C) (Oral)   Resp 20   Ht 5' 2" (1.575 m)   Wt 89.8 kg (198 lb)   SpO2 96%   BMI 36.21 kg/m   Physical Exam: General: Alert and awake oriented x3 not in any acute distress. HEENT: anicteric sclera, pupils reactive to light and accommodation CVS: S1-S2 clear no murmur rubs or gallops Chest: clear to auscultation bilaterally, no wheezing rales or rhonchi Abdomen: soft nontender, nondistended, normal bowel sounds Extremities:  no cyanosis, clubbing or edema noted bilaterally Neuro: Cranial nerves II-XII intact, no focal neurological deficits   The results of significant diagnostics from this hospitalization (including imaging, microbiology, ancillary and laboratory) are listed below for reference.    LAB RESULTS: Basic Metabolic Panel: Recent Labs  Lab 08/26/17 0342 08/27/17 0511  NA 137 135  K 2.7* 3.3*  CL 101 100*  CO2 30 26  GLUCOSE 125* 157*  BUN 6 6  CREATININE 0.73 0.74  CALCIUM 8.2* 8.2*  MG 1.4* 1.7   Liver Function Tests: Recent Labs  Lab 08/20/17 1640 08/22/17 0444  AST 17 24  ALT 13* 15  ALKPHOS 77 69  BILITOT 0.8 0.5  PROT 7.3 5.6*  ALBUMIN 3.5 2.3*   Recent Labs  Lab 08/20/17 1640  LIPASE 24   No results for input(s): AMMONIA in the last 168 hours. CBC: Recent Labs  Lab 08/20/17 1640 08/22/17 0444 08/23/17 0410  WBC 11.4* 6.8 5.8  NEUTROABS 10.1*  --   --  HGB 12.7 10.0* 10.2*  HCT 37.7 30.2* 30.3*  MCV 83.2 82.3 80.6  PLT 155 110* 136*   Cardiac Enzymes: No results for input(s): CKTOTAL, CKMB, CKMBINDEX, TROPONINI in the last 168 hours. BNP: Invalid input(s): POCBNP CBG: Recent Labs  Lab 08/26/17 2227 08/27/17 0819  GLUCAP 182* 129*    Significant Diagnostic Studies:  Mr Lumbar Spine W Wo Contrast  Result Date: 08/20/2017 CLINICAL DATA:  Severe RIGHT hip pain radiating to back for 2 days. Back pain beginning last night. Sepsis. History of diabetes. EXAM: MRI LUMBAR SPINE WITHOUT AND WITH CONTRAST TECHNIQUE: Multiplanar and multiecho pulse sequences of the lumbar spine were obtained without and with intravenous contrast. CONTRAST:  5m MULTIHANCE GADOBENATE DIMEGLUMINE 529 MG/ML IV SOLN COMPARISON:  None. FINDINGS: SEGMENTATION: For the purposes of this report, the last well-formed intervertebral disc will be reported as L5-S1. Transitional anatomy with small S1-2 disc, lumbarized S1 vertebral body. ALIGNMENT: Maintained lumbar lordosis. No malalignment.  VERTEBRAE:Vertebral bodies are intact. Intervertebral discs demonstrate normal morphology, mild desiccation L5-S1 disc. Multilevel mild acute on chronic discogenic endplate changes. No abnormal osseous or suspicious disc enhancement. CONUS MEDULLARIS: Conus medullaris terminates at L2-3 and demonstrates normal morphology and signal characteristics. 2 mm tubular T1 bright signal along the filum terminale with chemical shift artifact consistent with fibro lipomatosis changes. No abnormal cord, leptomeningeal enhancement. PARASPINAL AND SOFT TISSUES: Faint enhancement and bright interstitial STIR signal paraspinal muscles mid to lower lumbar spine. No focal fluid collection. DISC LEVELS: T12-L1 thru L2-3: No disc bulge, canal stenosis nor neural foraminal narrowing. L3-4: No disc bulge. Moderate RIGHT mild LEFT facet arthropathy without canal stenosis or neural foraminal narrowing. L4-5: Small RIGHT subarticular disc protrusion with faint enhancing annular fissure. Moderate RIGHT greater than LEFT facet arthropathy and ligamentum flavum redundancy. Trace RIGHT facet synovial cyst within dorsal epidural space. No canal stenosis. Mild RIGHT neural foraminal narrowing. L5-S1: Small LEFT subarticular disc protrusion. Central enhancing annular fissure. Severe facet arthropathy. No canal stenosis. Moderate RIGHT, moderate to severe LEFT neural foraminal narrowing. IMPRESSION: 1. No MR findings of discitis osteomyelitis. Faintly enhancing paraspinal muscles seen with low-grade strain, less likely myositis. 2. Small RIGHT L4-5 disc protrusion with annular fissure may be acute. 3. Degenerative change of the lumbar spine. No canal stenosis. Neural foraminal narrowing L4-5 and L5-S1: Moderate to severe on the LEFT at L5-S1. 4. Low-lying spinal cord with fibro lipomatosis changes of filum terminale, no cord tethering. Electronically Signed   By: CElon AlasM.D.   On: 08/20/2017 22:43   Mr Hip Right W Wo Contrast  Result  Date: 08/20/2017 CLINICAL DATA:  Right hip pain. No known injury. Suspect soft tissue infection. EXAM: MRI OF THE RIGHT HIP WITHOUT AND WITH CONTRAST TECHNIQUE: Multiplanar, multisequence MR imaging was performed both before and after administration of intravenous contrast. CONTRAST:  226mMULTIHANCE GADOBENATE DIMEGLUMINE 529 MG/ML IV SOLN COMPARISON:  Right hip radiographs 08/20/2017. MRI lumbar spine 11/16/2012 FINDINGS: Bones: No focal bone lesions identified. Bone cortex appears intact. Marrow signal intensities are homogeneous and normal. Articular cartilage and labrum Articular cartilage:  Appears homogeneous and intact. Labrum:  Appears intact. Joint or bursal effusion Joint effusion: Small joint effusions are present bilaterally and are symmetrical. Bursae:  No abnormal bursal collections. Muscles and tendons Muscles and tendons: Normal appearance. No abnormal signal intensity changes, expansile process, solid mass, or fluid collections identified. Other findings Miscellaneous: Visualized portions of the pelvic organs are unremarkable. IMPRESSION: No acute process identified. No evidence of acute fracture, bone contusion,  or inflammatory change involving the right hip or musculature. Electronically Signed   By: Lucienne Capers M.D.   On: 08/20/2017 23:25   Ct Abdomen Pelvis W Contrast  Result Date: 08/21/2017 CLINICAL DATA:  Back pain EXAM: CT ABDOMEN AND PELVIS WITH CONTRAST TECHNIQUE: Multidetector CT imaging of the abdomen and pelvis was performed using the standard protocol following bolus administration of intravenous contrast. CONTRAST:  142m ISOVUE-300 IOPAMIDOL (ISOVUE-300) INJECTION 61% COMPARISON:  08/20/2017, 11/10/2011 FINDINGS: Lower chest: Lung bases demonstrate patchy dependent atelectasis. No consolidation or pleural effusion. Heart size within normal limits. Hepatobiliary: Hepatic steatosis. Post cholecystectomy. Prominent extrahepatic bile duct likely due to postsurgical changes.  Pancreas: Unremarkable. No pancreatic ductal dilatation or surrounding inflammatory changes. Spleen: Normal in size without focal abnormality. Adrenals/Urinary Tract: Adrenal glands are unremarkable. Kidneys are normal, without renal calculi, focal lesion, or hydronephrosis. Bladder is unremarkable. Stomach/Bowel: Stomach is within normal limits. Status post appendectomy. No evidence of bowel wall thickening, distention, or inflammatory changes. Vascular/Lymphatic: Aortic atherosclerosis. No enlarged abdominal or pelvic lymph nodes. Reproductive: Status post hysterectomy. No adnexal masses. Other: Negative for free air or free fluid.  Fat in the umbilicus Musculoskeletal: Degenerative changes. No acute or suspicious bone lesion IMPRESSION: 1. No CT evidence for acute intra-abdominal or pelvic abnormality 2. Hepatic steatosis Electronically Signed   By: KDonavan FoilM.D.   On: 08/21/2017 02:17   Dg Chest Port 1 View  Result Date: 08/20/2017 CLINICAL DATA:  Nodes sepsis.  Fever.  Shortness of breath. EXAM: PORTABLE CHEST 1 VIEW COMPARISON:  None. FINDINGS: The heart size and mediastinal contours are within normal limits. Both lungs are clear. The visualized skeletal structures are unremarkable. IMPRESSION: No active disease. Electronically Signed   By: DDorise BullionIII M.D   On: 08/20/2017 17:28   Dg Hip Unilat W Or Wo Pelvis 2-3 Views Right  Result Date: 08/20/2017 CLINICAL DATA:  Right hip pain.  No known injury. EXAM: DG HIP (WITH OR WITHOUT PELVIS) 2-3V RIGHT COMPARISON:  None. FINDINGS: There is no evidence of hip fracture or dislocation. There is no evidence of arthropathy or other focal bone abnormality. IMPRESSION: Normal examination. Electronically Signed   By: SClaudie ReveringM.D.   On: 08/20/2017 09:18    2D ECHO:   Disposition and Follow-up: Discharge Instructions    Diet Carb Modified   Complete by:  As directed    Home infusion instructions [facility] May follow [facility] Dosing Protocol; May administer Cathflo as needed to maintain patency of vascular access device.; Flushing of vascular access device: per [facility]Protocol: 0.9% NaCl pre/post medica...   Complete by:  As directed    Instructions:  May follow A[place]landDosing Protocol   Instructions:  May administer Cathflo as needed to maintain patency of vascular access device.   Instructions:  Flushing of vascular access device: per [facility]Protocol: 0.9% NaCl pre/post medication administration and prn patency; Heparin 100 u/ml, 573mfor implanted ports and Heparin 10u/ml, 93m43mor all other central venous catheters.   Instructions:  May follow [facility] Anaphylaxis Protocol for First Dose Administration in the home: 0.9% NaCl at 25-50 ml/hr to maintain IV access for protocol meds. Epinephrine 0.3 ml IV/IM PRN and Benadryl 25-50 IV/IM PRN s/s of anaphylaxis.   Instructions:  [facility] Coordinator (RN) to assist per patient IV care needs in the home PRN.   Increase activity slowly   Complete by:  As directed        DISPOSITION: Home  [place] Follow up.   Why:  ROLLING WALKER AND 3 IN 1/ BEDSIDE COMMODE WILL BE DELIVERED TO BEDSIDE PRIOR TO DISCHARGE Contact information: [address] [place] [address] [phone]        Health, [facility]-Home Follow up.   Specialty:  Home Health Services Why:  [place], OFFICE WILL CALL AND SET UP HOME VISITS Contact information: [address] [place] [address] [phone]        Carollee Herter, Alferd Apa, DO. Schedule an appointment as soon as possible for a visit in 2 week(s).   Specialty:  Family Medicine Contact information: [address] [place] [address] [phone]        Thayer Headings, MD. Schedule an appointment as soon as possible for a visit in 3 week(s).   Specialty:  Infectious Diseases Why:   please call on Monday for follow-up appt  Contact information: [address] [address] [phone]            Time spent on Discharge: 81mns   Signed:   REstill CottaM.D. [facility] 08/27/2017, 10:05 AM Pager: [phone]

## 2017-08-27 NOTE — Clinical Social Work Note (Signed)
Clinical Social Work Assessment  Patient Details  Name: Heather Walker MRN: [id number] Date of Birth: 06/30/1961  Date of referral:  08/27/17               Reason for consult:  Discharge Planning                Permission sought to share information with:  Family Supports Permission granted to share information::  Yes, Verbal Permission Granted  Name::     Heather Walker  Agency::  snf  Relationship::  spouse  Contact Information:  [phone]  Housing/Transportation Living arrangements for the past 2 months:  [place] of Information:  Patient Patient Interpreter Needed:  None Criminal Activity/Legal Involvement Pertinent to Current Situation/Hospitalization:  No - Comment as needed Significant Relationships:  Spouse, Adult Children Lives with:  Spouse Do you feel safe going back to the place where you live?  Yes Need for family participation in patient care:  Yes (Comment)  Care giving concerns:  Patients husband at bedside and very supportive of patient and her needs.  Social Worker assessment / plan:  CSW met patient at bedside to offer support and discharge needs. Patients husband was present during assessment. Patient stated she would do better at a SNF. Patient stated she has never been to a facility before but is aware that it will assist her. Patient willing to d/c to facility to be able to get back to baseline. CSW to follow up with patients once bed offers are available.   Employment status:  Retired Nurse, adult PT Recommendations:  [place] / Referral to community resources:  [place]  Patient/Family's Response to care:  Patient very appreciative of CSW role in care  Patient/Family's Understanding of and Emotional Response to Diagnosis, Current Treatment, and Prognosis:  Patient and family supportive of patents needs. Patient has clear understanding of recent hospitalization and  limitations.   Emotional Assessment Appearance:  Appears stated age Attitude/Demeanor/Rapport:  Other Affect (typically observed):  Pleasant Orientation:  Oriented to Self, Oriented to Place, Oriented to  Time, Oriented to Situation Alcohol / Substance use:  Not Applicable Psych involvement (Current and /or in the community):  No (Comment)  Discharge Needs  Concerns to be addressed:  No discharge needs identified Readmission within the last 30 days:  No Current discharge risk:  None Barriers to Discharge:  No Barriers Identified   Wende Neighbors, LCSW 08/27/2017, 6:41 PM

## 2017-08-28 DIAGNOSIS — A419 Sepsis, unspecified organism: Secondary | ICD-10-CM | POA: Diagnosis not present

## 2017-08-28 DIAGNOSIS — I10 Essential (primary) hypertension: Secondary | ICD-10-CM | POA: Diagnosis not present

## 2017-08-28 DIAGNOSIS — R262 Difficulty in walking, not elsewhere classified: Secondary | ICD-10-CM | POA: Diagnosis not present

## 2017-08-28 DIAGNOSIS — J45909 Unspecified asthma, uncomplicated: Secondary | ICD-10-CM | POA: Diagnosis not present

## 2017-08-28 DIAGNOSIS — R2689 Other abnormalities of gait and mobility: Secondary | ICD-10-CM | POA: Diagnosis not present

## 2017-08-28 DIAGNOSIS — A4101 Sepsis due to Methicillin susceptible Staphylococcus aureus: Secondary | ICD-10-CM | POA: Diagnosis not present

## 2017-08-28 DIAGNOSIS — M6281 Muscle weakness (generalized): Secondary | ICD-10-CM | POA: Diagnosis not present

## 2017-08-28 DIAGNOSIS — M25511 Pain in right shoulder: Secondary | ICD-10-CM | POA: Diagnosis not present

## 2017-08-28 DIAGNOSIS — E031 Congenital hypothyroidism without goiter: Secondary | ICD-10-CM | POA: Diagnosis not present

## 2017-08-28 DIAGNOSIS — R7881 Bacteremia: Secondary | ICD-10-CM | POA: Diagnosis not present

## 2017-08-28 DIAGNOSIS — E1165 Type 2 diabetes mellitus with hyperglycemia: Secondary | ICD-10-CM | POA: Diagnosis not present

## 2017-08-28 DIAGNOSIS — R269 Unspecified abnormalities of gait and mobility: Secondary | ICD-10-CM | POA: Diagnosis not present

## 2017-08-28 DIAGNOSIS — M25551 Pain in right hip: Secondary | ICD-10-CM | POA: Diagnosis not present

## 2017-08-28 DIAGNOSIS — M545 Low back pain: Secondary | ICD-10-CM | POA: Diagnosis not present

## 2017-08-28 DIAGNOSIS — F5101 Primary insomnia: Secondary | ICD-10-CM | POA: Diagnosis not present

## 2017-08-28 DIAGNOSIS — K219 Gastro-esophageal reflux disease without esophagitis: Secondary | ICD-10-CM | POA: Diagnosis not present

## 2017-08-28 DIAGNOSIS — R5381 Other malaise: Secondary | ICD-10-CM | POA: Diagnosis not present

## 2017-08-28 LAB — GLUCOSE, CAPILLARY
Glucose-Capillary: 126 mg/dL — ABNORMAL HIGH (ref 65–99)
Glucose-Capillary: 109 mg/dL — ABNORMAL HIGH (ref 65–99)

## 2017-08-28 LAB — MAGNESIUM: Magnesium: 1.8 mg/dL (ref 1.7–2.4)

## 2017-08-28 NOTE — Progress Notes (Signed)
Patient was discharged to nursing home [facility])  by MD order; discharged instructions review and sent to facility via [facility]  with care notes and prescriptions; IV DIC; Midline RUA for long-term ABX in place; skin intact; facility was called and report was given to the nurse who is going to receive the patient; patient will be transported to facility via [place].

## 2017-08-28 NOTE — Care Management Note (Addendum)
Case Management Note  Patient Details  Name: Heather Walker MRN: [id number] Date of Birth: 09-10-1961  Subjective/Objective:               Admitted with sepsis/MSSA bacteremia, hx DM, HTN, Heart Murmur,Arthritis. From home with husband.  Action/Plan: Plan is to d/c to SNF today. CSW aware and is managing disposition to facility.  Expected Discharge Date:  08/28/2017           Expected Discharge Plan:  SNF  In-House Referral:  Clinical Social Work  Discharge planning Services  CM Consult  Status of Service:  Completed, signed off  If discussed at [employer] of Stay Meetings, dates discussed:    Additional Comments:  Sharin Mons, RN 08/28/2017, 10:32 AM

## 2017-08-28 NOTE — NC FL2 (Signed)
[place] LEVEL OF CARE SCREENING TOOL     IDENTIFICATION  Patient Name: Heather Walker Birthdate: 03-08-1961 Sex: female Admission Date (Current Location): 08/20/2017  [facility] and [place] Number:  Herbalist and Address:  [facility]. [facility], [place] [address] [place], [place] [address]      Provider Number: [id number]  Attending Physician Name and Address:  Mendel Corning, MD  Relative Name and Phone Number:  Rainy Rothman, [phone]    Current Level of Care: Hospital Recommended Level of Care: [place] Prior Approval Number:    Date Approved/Denied:   PASRR Number: [id number]  Discharge Plan: SNF    Current Diagnoses: Patient Active Problem List   Diagnosis Date Noted  . Sepsis ([place]) 08/21/2017  . Acute right hip pain 08/21/2017  . Preventative health care 11/20/2016  . Right ankle pain 05/09/2016  . Palpitations 12/08/2014  . Diabetes mellitus type II, uncontrolled ([place]) 08/03/2014  . Leg wound, right 08/03/2014  . Hyperglycemia 07/13/2014  . Chest pain 07/12/2014  . Diabetes mellitus ([place]) 07/12/2014  . Acute kidney injury ([place]) 07/12/2014  . Abdominal pain, right upper quadrant 05/28/2014  . Tachycardia 05/03/2013  . Abscess 11/13/2012  . Breast pain, right 08/10/2012  . OA (osteoarthritis) 03/04/2011  . DEPRESSION, MAJOR, RECURRENT, SEVERE 12/22/2009  . BACK PAIN, LUMBAR, WITH RADICULOPATHY 09/17/2009  . CALF PAIN, RIGHT 09/17/2009  . RENAL CYST 08/28/2009  . LOW BACK PAIN, ACUTE 11/13/2008  . FIBROIDS, UTERUS 09/10/2007  . UTI 09/03/2007  . BACTERIAL VAGINITIS 09/03/2007  . PELVIC PAIN, RIGHT 09/03/2007  . HYPOKALEMIA, HX OF 09/03/2007  . MOLE 06/21/2007  . ALLERGIC RHINITIS 06/21/2007  . HEMOCCULT POSITIVE STOOL 06/21/2007  . SINUSITIS, ACUTE NOS 04/05/2007  . REFLUX, ESOPHAGEAL 04/05/2007  . CHEST PAIN 04/05/2007  . Essential hypertension 03/19/2007  . ALLERGIC RHINITIS, SEASONAL  03/09/2007  . ASTHMA 03/09/2007    Orientation RESPIRATION BLADDER Height & Weight     Self, Time, Situation, Place  Normal Continent Weight: 89.8 kg (198 lb) Height:  5\' 2"  (157.5 cm)  BEHAVIORAL SYMPTOMS/MOOD NEUROLOGICAL BOWEL NUTRITION STATUS      Continent Diet(carb modified)  AMBULATORY STATUS COMMUNICATION OF NEEDS Skin   Limited Assist Verbally Normal                       Personal Care Assistance Level of Assistance  Bathing, Feeding, Dressing Bathing Assistance: Limited assistance Feeding assistance: Independent Dressing Assistance: Limited assistance     Functional Limitations Info  Sight, Hearing, Speech Sight Info: Adequate Hearing Info: Adequate Speech Info: Adequate    SPECIAL CARE FACTORS FREQUENCY  PT (By licensed PT), OT (By licensed OT)                    Contractures Contractures Info: Not present    Additional Factors Info  Code Status, Allergies Code Status Info: Full Code Allergies Info: Atorvastatin, Codeine           Current Medications (08/28/2017):  This is the current hospital active medication list Current Facility-Administered Medications  Medication Dose Route Frequency Provider Last Rate Last Dose  . acetaminophen (TYLENOL) tablet 650 mg  650 mg Oral Q6H PRN [name], MD   650 mg at 08/23/17 2050   Or  . acetaminophen (TYLENOL) suppository 650 mg  650 mg Rectal Q6H PRN [name], MD      . albuterol (PROVENTIL) (2.5 MG/3ML) 0.083% nebulizer  solution 2.5 mg  2.5 mg Nebulization Q2H PRN [name], MD   2.5 mg at 08/23/17 2247  . ceFAZolin (ANCEF) IVPB 2g/100 mL premix  2 g Intravenous Q8H [name], MD   Stopped at 08/28/17 [phone]  . cloNIDine (CATAPRES) tablet 0.2 mg  0.2 mg Oral BID Etta Quill, DO   0.2 mg at 08/27/17 2121  . cyclobenzaprine (FLEXERIL) tablet 5 mg  5 mg Oral TID PRN Etta Quill, DO   5 mg at 08/23/17 0936  . enoxaparin (LOVENOX) injection 40 mg  40 mg  Subcutaneous Q24H Jennette Kettle M, DO   40 mg at 08/27/17 1214  . hydrALAZINE (APRESOLINE) injection 5 mg  5 mg Intravenous Q4H PRN Rise Patience, MD   5 mg at 08/27/17 2300  . insulin aspart (novoLOG) injection 0-15 Units  0-15 Units Subcutaneous TID WC Etta Quill, DO   2 Units at 08/28/17 0749  . insulin glargine (LANTUS) injection 5 Units  5 Units Subcutaneous QHS Rai, Ripudeep K, MD   5 Units at 08/27/17 2121  . ondansetron (ZOFRAN) tablet 4 mg  4 mg Oral Q6H PRN Etta Quill, DO       Or  . ondansetron [facility]) injection 4 mg  4 mg Intravenous Q6H PRN Etta Quill, DO   4 mg at 08/23/17 1129  . oxyCODONE (Oxy IR/ROXICODONE) immediate release tablet 5-10 mg  5-10 mg Oral Q4H PRN [name], MD   10 mg at 08/27/17 2300  . pantoprazole (PROTONIX) EC tablet 40 mg  40 mg Oral Daily Jennette Kettle M, DO   40 mg at 08/27/17 1019  . phenol (CHLORASEPTIC) mouth spray 1 spray  1 spray Mouth/Throat PRN Rai, Ripudeep K, MD   1 spray at 08/27/17 1213  . sodium chloride flush (NS) 0.9 % injection 10-40 mL  10-40 mL Intracatheter PRN Allie Bossier, MD      . sodium chloride flush (NS) 0.9 % injection 10-40 mL  10-40 mL Intracatheter PRN Rai, Ripudeep K, MD      . traZODone (DESYREL) tablet 25-50 mg  25-50 mg Oral QHS PRN Etta Quill, DO         Discharge Medications: Please see discharge summary for a list of discharge medications.  Relevant Imaging Results:  Relevant Lab Results:   Additional Information SS#[id number] pt will d/c with IV antibiotics and will be on it for 3 1/2 weeks (ancef)   Benard Halsted, LCSWA

## 2017-08-28 NOTE — Progress Notes (Signed)
Patient will DC to: [facility] Anticipated DC date: 08/28/17 Family notified: Patient declined when CSW offered to contact family Transport by: Corey Harold   Per MD patient ready for DC to [facility]. RN, patient, patient's family, and facility notified of DC. Discharge Summary sent to facility. RN given number for report [phone]). DC packet on chart. Ambulance transport requested for patient.   CSW signing off.  Cedric Fishman, [employer] Social Worker [phone]

## 2017-08-28 NOTE — Clinical Social Work Placement (Signed)
   CLINICAL SOCIAL WORK PLACEMENT  NOTE  Date:  08/28/2017  Patient Details  Name: Heather Walker MRN: [id number] Date of Birth: [birth year]  Clinical Social Work is seeking post-discharge placement for this patient at the [place] level of care (*CSW will initial, date and re-position this form in  chart as items are completed):  Yes   Patient/family provided with [place] Work Department's list of facilities offering this level of care within the geographic area requested by the patient (or if unable, by the patient's family).  Yes   Patient/family informed of their freedom to choose among providers that offer the needed level of care, that participate in Medicare, Medicaid or managed care program needed by the patient, have an available bed and are willing to accept the patient.  Yes   Patient/family informed of [facility]'s ownership interest in [facility] and [facility], as well as of the fact that they are under no obligation to receive care at these facilities.  PASRR submitted to EDS on 08/28/17     PASRR number received on 08/28/17     Existing PASRR number confirmed on       FL2 transmitted to all facilities in geographic area requested by pt/family on 08/28/17     FL2 transmitted to all facilities within larger geographic area on       Patient informed that his/her managed care company has contracts with or will negotiate with certain facilities, including the following:        Yes   Patient/family informed of bed offers received.  Patient chooses bed at [facility]     Physician recommends and patient chooses bed at      Patient to be transferred to [facility] on 08/28/17.  Patient to be transferred to facility by [facility]     Patient family notified on 08/28/17 of transfer.  Name of family member notified:  Patient declined for CSW to contact family     PHYSICIAN       Additional Comment:     _______________________________________________ Benard Halsted, [place] 08/28/2017, 1:27 PM

## 2017-08-28 NOTE — Progress Notes (Signed)
Physical Therapy Treatment Patient Details Name: Heather Walker MRN: [id number] DOB: [birth year] Today's Date: 08/28/2017    History of Present Illness Heather Walker a 56 y.o.femalewith medical history significant ofDM2, HTN. Patient presents to the ED with c/o severe lower back and R hip pain. MRI negative however pt with sepsis.  TEE perfomed on 08/24/17--negative for endocarditis    PT Comments    Pt currently limited by pain and LE weakness. Ambulated 20 ft in room with min A for walker management. Updated pt recommendation to SNF at d/c to maximize pt safety with mobility and functional independence. D/C plan has been discussed with PT and patient is expected to d/c to SNF today.   Follow Up Recommendations  SNF     Equipment Recommendations  Rolling walker with 5" wheels;3in1 (PT)    Recommendations for Other Services       Precautions / Restrictions Precautions Precautions: Fall Precaution Comments: pain Restrictions Weight Bearing Restrictions: No    Mobility  Bed Mobility Overal bed mobility: Needs Assistance Bed Mobility: Supine to Sit     Supine to sit: Min guard     General bed mobility comments: Cues for technique, pt required use of bed rails to power up into seated position. Increased time, and effort. HOB flat  Transfers Overall transfer level: Needs assistance Equipment used: Rolling walker (2 wheeled) Transfers: Sit to/from Stand Sit to Stand: Min guard         General transfer comment: min guard for pt safety as pt reported dizziness on rising into sitting and standing.  Ambulation/Gait Ambulation/Gait assistance: Min assist Ambulation Distance (Feet): 20 Feet Assistive device: Rolling walker (2 wheeled) Gait Pattern/deviations: Step-through pattern;Decreased stride length;Antalgic;Decreased stance time - right;Decreased step length - right Gait velocity: slow Gait velocity interpretation: Below normal speed for age/gender General  Gait Details: min A for walker management during turns. Pt ambulated 20 ft in room. pt reliant on UE for support seconadry to R LE weakness.   Stairs            Wheelchair Mobility    Modified Rankin (Stroke Patients Only)       Balance Overall balance assessment: Needs assistance Sitting-balance support: Feet supported;Bilateral upper extremity supported Sitting balance-Leahy Scale: Good Sitting balance - Comments: no LOB with donning socks   Standing balance support: Bilateral upper extremity supported Standing balance-Leahy Scale: Poor Standing balance comment: reliant on BIL UE support                            Cognition Arousal/Alertness: Awake/alert Behavior During Therapy: WFL for tasks assessed/performed Overall Cognitive Status: Within Functional Limits for tasks assessed                                        Exercises General Exercises - Lower Extremity Quad Sets: Strengthening;Right;5 reps;Seated Long Arc Quad: AAROM;Right;5 reps;Seated Heel Slides: AROM;Right;5 reps;Seated    General Comments        Pertinent Vitals/Pain Pain Assessment: 0-10 Pain Score: 5  Pain Location: R Hip Pain Descriptors / Indicators: Grimacing;Guarding Pain Intervention(s): Monitored during session;Limited activity within patient's tolerance;Repositioned    Home Living                      Prior Function            PT  Goals (current goals can now be found in the care plan section) Acute Rehab PT Goals Patient Stated Goal: to go home tomorrow PT Goal Formulation: With patient Time For Goal Achievement: 09/06/17 Potential to Achieve Goals: Good Progress towards PT goals: Progressing toward goals(slowly)    Frequency    Min 3X/week      PT Plan Discharge plan needs to be updated    Co-evaluation              AM-PAC PT "6 Clicks" Daily Activity  Outcome Measure  Difficulty turning over in bed (including  adjusting bedclothes, sheets and blankets)?: Unable Difficulty moving from lying on back to sitting on the side of the bed? : Unable Difficulty sitting down on and standing up from a chair with arms (e.g., wheelchair, bedside commode, etc,.)?: Unable Help needed moving to and from a bed to chair (including a wheelchair)?: A Little Help needed walking in hospital room?: A Lot Help needed climbing 3-5 steps with a railing? : A Lot 6 Click Score: 10    End of Session Equipment Utilized During Treatment: Gait belt Activity Tolerance: Patient limited by pain(and weakness) Patient left: in chair;with call bell/phone within reach Nurse Communication: Mobility status PT Visit Diagnosis: Difficulty in walking, not elsewhere classified (R26.2);Pain Pain - Right/Left: Right Pain - part of body: Hip(back)     Time: 3833-3832 PT Time Calculation (min) (ACUTE ONLY): 16 min  Charges:  $Therapeutic Activity: 8-22 mins                    G Codes:      Benjiman Core, [place] Pager [phone] Acute Rehab   Allena Katz 08/28/2017, 11:32 AM

## 2017-08-28 NOTE — Progress Notes (Signed)
[facility]                                                                              Patient Demographics  Heather Walker, is a 56 y.o. female, DOB - 06-13-1961, ZOX:[id number]  Admit date - 08/20/2017   Admitting Physician Etta Quill, DO  Outpatient Primary MD for the patient is Carollee Herter, Alferd Apa, DO  Outpatient specialists:   LOS - 7  days   Medical records reviewed and are as summarized below:    Chief Complaint  Patient presents with  . Code Sepsis  . Fever       Brief summary  56 y.o.BF PMHx DM Type 2 without complicationand HTN, Heart Murmur, Arthritis Presented to the ED with c/o severe persistent low back and RIGHThip pain for 2-3 days, worse with any movement or palpation or walking. Per EMS she was screaming in pain on arrival. MRI of the L spine and R hip in the ED were unremarkable. No overlying skin lesions were noted on exam.     Assessment & Plan    Principal Problem:   Sepsis (HCC)/MSSA bacteremia - Unclear source, MRI of the right hip and lumbar spine did not show any discitis, abscess - Blood cultures 11/4 positive for MSSA, repeat 11/6 are also positive for GPC staph, cultures repeated on 11/8 so far negative - TEE  showed no vegetations - CT abdomen showed no acute intra-abdominal pelvic abnormality. UA negative for UTI - Chest x-ray 11/4 with no active disease -ID was consulted, PICC line placed, patient would need IV Ancef for 4 weeks.  Active Problems:   Essential hypertension - continue outpatient medications including Benicar, triamterene HCTZ, continue clonidine    Diabetes mellitus type II, uncontrolled (HCC) -Continue sliding scale insulin.  Hemoglobin A1c 9.7 in 04/2017 -HbA1c 8.9, needs tighter glycemic control outpatient    Acute right hip pain, back pain -Continue pain control, PT OT -No acute findings on the MRI of the lumbar spine and right hip  Obesity - BMI 36, patient recommended diet and  weight control  Constipation -Placed on MiraLAX   Code Status: Full code DVT Prophylaxis:  Lovenox  Family Communication: Discussed in detail with the patient, all imaging results, lab results explained to the patient    Disposition Plan: DC summary from 11/11 stillstands, no change in management.  Patient was initially plan to discharge home with [facility] but then requested for skilled nursing facility.  Time Spent in minutes   15 minutes  Procedures:    Consultants:   cardiology ID   Antimicrobials:   IV Ancef   Medications  Scheduled Meds: . cloNIDine  0.2 mg Oral BID  . enoxaparin (LOVENOX) injection  40 mg Subcutaneous Q24H  . insulin aspart  0-15 Units Subcutaneous TID WC  . insulin glargine  5 Units Subcutaneous QHS  . pantoprazole  40 mg Oral Daily   Continuous Infusions: .  ceFAZolin (ANCEF) IV Stopped (08/28/17 0627)   PRN Meds:.acetaminophen **OR** acetaminophen, albuterol, cyclobenzaprine, hydrALAZINE, ondansetron **OR** ondansetron (ZOFRAN) IV, oxyCODONE, phenol, sodium chloride flush, sodium chloride flush, traZODone   Antibiotics   Anti-infectives (From  admission, onward)   Start     Dose/Rate Route Frequency Ordered Stop   08/27/17 0000  ceFAZolin (ANCEF) IVPB     2 g Intravenous Every 8 hours 08/27/17 0817     08/22/17 1945  ceFAZolin (ANCEF) IVPB 2g/100 mL premix     2 g 200 mL/hr over 30 Minutes Intravenous Every 8 hours 08/22/17 1933     08/21/17 1700  ceFAZolin (ANCEF) IVPB 2g/100 mL premix  Status:  Discontinued     2 g 200 mL/hr over 30 Minutes Intravenous Every 8 hours 08/21/17 1443 08/22/17 1933   08/21/17 1500  ceFAZolin (ANCEF) IVPB 2g/100 mL premix  Status:  Discontinued     2 g 200 mL/hr over 30 Minutes Intravenous Every 8 hours 08/21/17 1439 08/21/17 1443   08/21/17 0400  vancomycin (VANCOCIN) IVPB 1000 mg/200 mL premix  Status:  Discontinued     1,000 mg 200 mL/hr over 60 Minutes Intravenous Every 12 hours 08/20/17 1752 08/21/17 1430    08/21/17 0000  piperacillin-tazobactam (ZOSYN) IVPB 3.375 g  Status:  Discontinued     3.375 g 12.5 mL/hr over 240 Minutes Intravenous Every 8 hours 08/20/17 1752 08/21/17 1430   08/20/17 1645  piperacillin-tazobactam (ZOSYN) IVPB 3.375 g     3.375 g 100 mL/hr over 30 Minutes Intravenous  Once 08/20/17 1640 08/20/17 1735   08/20/17 1645  vancomycin (VANCOCIN) IVPB 1000 mg/200 mL premix     1,000 mg 200 mL/hr over 60 Minutes Intravenous  Once 08/20/17 1640 08/20/17 1805        Subjective:   Heather Walker was seen and examined today.  No new complaints.,  No fevers or chills.   Patient denies dizziness, chest pain, shortness of breath, abdominal pain, N/V/D/C, new weakness, numbess, tingling. No acute events overnight.  Objective:   Vitals:   08/27/17 1429 08/27/17 2250 08/27/17 2358 08/28/17 0548  BP: (!) 169/90 (!) 166/100 (!) 156/89 (!) 146/85  Pulse: 83 67  76  Resp: 16 18  18   Temp: 99.2 F (37.3 C) 99 F (37.2 C)  98.8 F (37.1 C)  TempSrc: Oral Oral  Oral  SpO2: 98% 96%  97%  Weight:      Height:        Intake/Output Summary (Last 24 hours) at 08/28/2017 1046 Last data filed at 08/28/2017 0940 Gross per 24 hour  Intake 1082.4 ml  Output 500 ml  Net 582.4 ml     Wt Readings from Last 3 Encounters:  08/24/17 89.8 kg (198 lb)  08/20/17 85.7 kg (189 lb)  05/02/17 92.5 kg (204 lb)     Exam  General: Alert and oriented x 3, NAD Eyes: HEENT:   Cardiovascular: S1 S2 auscultated, no rubs, murmurs or gallops. Regular rate and rhythm. No pedal edema b/l Respiratory: Clear to auscultation bilaterally, no wheezing, rales or rhonchi Gastrointestinal: Soft, nontender, nondistended, + bowel sounds Ext: no pedal edema bilaterally Neuro: no new deficit  musculoskeletal: No digital cyanosis, clubbing Skin: No rashes Psych: Normal affect and demeanor, alert and oriented x3       Data Reviewed:  I have personally reviewed following labs and imaging  studies  Micro Results Recent Results (from the past 240 hour(s))  Blood Culture (routine x 2)     Status: Abnormal   Collection Time: 08/20/17  5:03 PM  Result Value Ref Range Status   Specimen Description BLOOD RIGHT ANTECUBITAL  Final   Special Requests   Final    BOTTLES DRAWN AEROBIC  AND ANAEROBIC Blood Culture adequate volume   Culture  Setup Time   Final    GRAM POSITIVE COCCI IN CLUSTERS IN BOTH AEROBIC AND ANAEROBIC BOTTLES CRITICAL RESULT CALLED TO, READ BACK BY AND VERIFIED WITH: A. JOHNSTON PHARMD, AT 0748 08/21/17 BY D. VANHOOK    Culture STAPHYLOCOCCUS AUREUS (A)  Final   Report Status 08/23/2017 FINAL  Final   Organism ID, Bacteria STAPHYLOCOCCUS AUREUS  Final      Susceptibility   Staphylococcus aureus - MIC*    CIPROFLOXACIN <=0.5 SENSITIVE Sensitive     ERYTHROMYCIN >=8 RESISTANT Resistant     GENTAMICIN <=0.5 SENSITIVE Sensitive     OXACILLIN <=0.25 SENSITIVE Sensitive     TETRACYCLINE <=1 SENSITIVE Sensitive     VANCOMYCIN <=0.5 SENSITIVE Sensitive     TRIMETH/SULFA <=10 SENSITIVE Sensitive     CLINDAMYCIN <=0.25 SENSITIVE Sensitive     RIFAMPIN <=0.5 SENSITIVE Sensitive     Inducible Clindamycin NEGATIVE Sensitive     * STAPHYLOCOCCUS AUREUS  Blood Culture ID Panel (Reflexed)     Status: Abnormal   Collection Time: 08/20/17  5:03 PM  Result Value Ref Range Status   Enterococcus species NOT DETECTED NOT DETECTED Final   Listeria monocytogenes NOT DETECTED NOT DETECTED Final   Staphylococcus species DETECTED (A) NOT DETECTED Final    Comment: CRITICAL RESULT CALLED TO, READ BACK BY AND VERIFIED WITH: A. JOHNSTON PHARMD, AT 2952 08/21/17 BY D. VANHOOK    Staphylococcus aureus DETECTED (A) NOT DETECTED Final    Comment: Methicillin (oxacillin) susceptible Staphylococcus aureus (MSSA). Preferred therapy is anti staphylococcal beta lactam antibiotic (Cefazolin or Nafcillin), unless clinically contraindicated. CRITICAL RESULT CALLED TO, READ BACK BY AND VERIFIED  WITH: A. JOHNSTON PHARMD, AT 8413 08/21/17 BY D. VANHOOK    Methicillin resistance NOT DETECTED NOT DETECTED Final   Streptococcus species NOT DETECTED NOT DETECTED Final   Streptococcus agalactiae NOT DETECTED NOT DETECTED Final   Streptococcus pneumoniae NOT DETECTED NOT DETECTED Final   Streptococcus pyogenes NOT DETECTED NOT DETECTED Final   Acinetobacter baumannii NOT DETECTED NOT DETECTED Final   Enterobacteriaceae species NOT DETECTED NOT DETECTED Final   Enterobacter cloacae complex NOT DETECTED NOT DETECTED Final   Escherichia coli NOT DETECTED NOT DETECTED Final   Klebsiella oxytoca NOT DETECTED NOT DETECTED Final   Klebsiella pneumoniae NOT DETECTED NOT DETECTED Final   Proteus species NOT DETECTED NOT DETECTED Final   Serratia marcescens NOT DETECTED NOT DETECTED Final   Haemophilus influenzae NOT DETECTED NOT DETECTED Final   Neisseria meningitidis NOT DETECTED NOT DETECTED Final   Pseudomonas aeruginosa NOT DETECTED NOT DETECTED Final   Candida albicans NOT DETECTED NOT DETECTED Final   Candida glabrata NOT DETECTED NOT DETECTED Final   Candida krusei NOT DETECTED NOT DETECTED Final   Candida parapsilosis NOT DETECTED NOT DETECTED Final   Candida tropicalis NOT DETECTED NOT DETECTED Final  Blood Culture (routine x 2)     Status: Abnormal   Collection Time: 08/20/17  5:05 PM  Result Value Ref Range Status   Specimen Description BLOOD RIGHT HAND  Final   Special Requests   Final    BOTTLES DRAWN AEROBIC ONLY Blood Culture adequate volume   Culture  Setup Time   Final    GRAM POSITIVE COCCI IN CLUSTERS AEROBIC BOTTLE ONLY CRITICAL VALUE NOTED.  VALUE IS CONSISTENT WITH PREVIOUSLY REPORTED AND CALLED VALUE.    Culture (A)  Final    STAPHYLOCOCCUS AUREUS SUSCEPTIBILITIES PERFORMED ON PREVIOUS CULTURE WITHIN THE LAST 5  DAYS.    Report Status 08/23/2017 FINAL  Final  Urine culture     Status: None   Collection Time: 08/20/17  6:53 PM  Result Value Ref Range Status    Specimen Description URINE, CATHETERIZED  Final   Special Requests NONE  Final   Culture NO GROWTH  Final   Report Status 08/21/2017 FINAL  Final  Culture, blood (routine x 2)     Status: Abnormal   Collection Time: 08/22/17  4:45 AM  Result Value Ref Range Status   Specimen Description BLOOD LEFT HAND  Final   Special Requests IN PEDIATRIC BOTTLE Blood Culture adequate volume  Final   Culture  Setup Time   Final    GRAM POSITIVE COCCI IN CLUSTERS IN PEDIATRIC BOTTLE CRITICAL RESULT CALLED TO, READ BACK BY AND VERIFIED WITH: J.LEDFORD PHARMD 08/22/17 2343 L.CHAMPION    Culture (A)  Final    STAPHYLOCOCCUS AUREUS SUSCEPTIBILITIES PERFORMED ON PREVIOUS CULTURE WITHIN THE LAST 5 DAYS.    Report Status 08/24/2017 FINAL  Final  Culture, blood (routine x 2)     Status: Abnormal   Collection Time: 08/22/17  4:50 AM  Result Value Ref Range Status   Specimen Description BLOOD RIGHT HAND  Final   Special Requests IN PEDIATRIC BOTTLE Blood Culture adequate volume  Final   Culture  Setup Time   Final    GRAM POSITIVE COCCI IN CLUSTERS IN PEDIATRIC BOTTLE CRITICAL VALUE NOTED.  VALUE IS CONSISTENT WITH PREVIOUSLY REPORTED AND CALLED VALUE.    Culture (A)  Final    STAPHYLOCOCCUS AUREUS SUSCEPTIBILITIES PERFORMED ON PREVIOUS CULTURE WITHIN THE LAST 5 DAYS.    Report Status 08/24/2017 FINAL  Final  Culture, blood (routine x 2)     Status: None (Preliminary result)   Collection Time: 08/24/17 12:36 PM  Result Value Ref Range Status   Specimen Description BLOOD LEFT ANTECUBITAL  Final   Special Requests IN PEDIATRIC BOTTLE Blood Culture adequate volume  Final   Culture NO GROWTH 3 DAYS  Final   Report Status PENDING  Incomplete  Culture, blood (routine x 2)     Status: None (Preliminary result)   Collection Time: 08/24/17 12:40 PM  Result Value Ref Range Status   Specimen Description BLOOD BLOOD LEFT HAND  Final   Special Requests IN PEDIATRIC BOTTLE Blood Culture adequate volume  Final    Culture NO GROWTH 3 DAYS  Final   Report Status PENDING  Incomplete    Radiology Reports Mr Lumbar Spine W Wo Contrast  Result Date: 08/20/2017 CLINICAL DATA:  Severe RIGHT hip pain radiating to back for 2 days. Back pain beginning last night. Sepsis. History of diabetes. EXAM: MRI LUMBAR SPINE WITHOUT AND WITH CONTRAST TECHNIQUE: Multiplanar and multiecho pulse sequences of the lumbar spine were obtained without and with intravenous contrast. CONTRAST:  55mL MULTIHANCE GADOBENATE DIMEGLUMINE 529 MG/ML IV SOLN COMPARISON:  None. FINDINGS: SEGMENTATION: For the purposes of this report, the last well-formed intervertebral disc will be reported as L5-S1. Transitional anatomy with small S1-2 disc, lumbarized S1 vertebral body. ALIGNMENT: Maintained lumbar lordosis. No malalignment. VERTEBRAE:Vertebral bodies are intact. Intervertebral discs demonstrate normal morphology, mild desiccation L5-S1 disc. Multilevel mild acute on chronic discogenic endplate changes. No abnormal osseous or suspicious disc enhancement. CONUS MEDULLARIS: Conus medullaris terminates at L2-3 and demonstrates normal morphology and signal characteristics. 2 mm tubular T1 bright signal along the filum terminale with chemical shift artifact consistent with fibro lipomatosis changes. No abnormal cord, leptomeningeal enhancement. PARASPINAL AND SOFT  TISSUES: Faint enhancement and bright interstitial STIR signal paraspinal muscles mid to lower lumbar spine. No focal fluid collection. DISC LEVELS: T12-L1 thru L2-3: No disc bulge, canal stenosis nor neural foraminal narrowing. L3-4: No disc bulge. Moderate RIGHT mild LEFT facet arthropathy without canal stenosis or neural foraminal narrowing. L4-5: Small RIGHT subarticular disc protrusion with faint enhancing annular fissure. Moderate RIGHT greater than LEFT facet arthropathy and ligamentum flavum redundancy. Trace RIGHT facet synovial cyst within dorsal epidural space. No canal stenosis. Mild RIGHT  neural foraminal narrowing. L5-S1: Small LEFT subarticular disc protrusion. Central enhancing annular fissure. Severe facet arthropathy. No canal stenosis. Moderate RIGHT, moderate to severe LEFT neural foraminal narrowing. IMPRESSION: 1. No MR findings of discitis osteomyelitis. Faintly enhancing paraspinal muscles seen with low-grade strain, less likely myositis. 2. Small RIGHT L4-5 disc protrusion with annular fissure may be acute. 3. Degenerative change of the lumbar spine. No canal stenosis. Neural foraminal narrowing L4-5 and L5-S1: Moderate to severe on the LEFT at L5-S1. 4. Low-lying spinal cord with fibro lipomatosis changes of filum terminale, no cord tethering. Electronically Signed   By: Elon Alas M.D.   On: 08/20/2017 22:43   Mr Hip Right W Wo Contrast  Result Date: 08/20/2017 CLINICAL DATA:  Right hip pain. No known injury. Suspect soft tissue infection. EXAM: MRI OF THE RIGHT HIP WITHOUT AND WITH CONTRAST TECHNIQUE: Multiplanar, multisequence MR imaging was performed both before and after administration of intravenous contrast. CONTRAST:  18mL MULTIHANCE GADOBENATE DIMEGLUMINE 529 MG/ML IV SOLN COMPARISON:  Right hip radiographs 08/20/2017. MRI lumbar spine 11/16/2012 FINDINGS: Bones: No focal bone lesions identified. Bone cortex appears intact. Marrow signal intensities are homogeneous and normal. Articular cartilage and labrum Articular cartilage:  Appears homogeneous and intact. Labrum:  Appears intact. Joint or bursal effusion Joint effusion: Small joint effusions are present bilaterally and are symmetrical. Bursae:  No abnormal bursal collections. Muscles and tendons Muscles and tendons: Normal appearance. No abnormal signal intensity changes, expansile process, solid mass, or fluid collections identified. Other findings Miscellaneous: Visualized portions of the pelvic organs are unremarkable. IMPRESSION: No acute process identified. No evidence of acute fracture, bone contusion, or  inflammatory change involving the right hip or musculature. Electronically Signed   By: Lucienne Capers M.D.   On: 08/20/2017 23:25   Ct Abdomen Pelvis W Contrast  Result Date: 08/21/2017 CLINICAL DATA:  Back pain EXAM: CT ABDOMEN AND PELVIS WITH CONTRAST TECHNIQUE: Multidetector CT imaging of the abdomen and pelvis was performed using the standard protocol following bolus administration of intravenous contrast. CONTRAST:  160mL ISOVUE-300 IOPAMIDOL (ISOVUE-300) INJECTION 61% COMPARISON:  08/20/2017, 11/10/2011 FINDINGS: Lower chest: Lung bases demonstrate patchy dependent atelectasis. No consolidation or pleural effusion. Heart size within normal limits. Hepatobiliary: Hepatic steatosis. Post cholecystectomy. Prominent extrahepatic bile duct likely due to postsurgical changes. Pancreas: Unremarkable. No pancreatic ductal dilatation or surrounding inflammatory changes. Spleen: Normal in size without focal abnormality. Adrenals/Urinary Tract: Adrenal glands are unremarkable. Kidneys are normal, without renal calculi, focal lesion, or hydronephrosis. Bladder is unremarkable. Stomach/Bowel: Stomach is within normal limits. Status post appendectomy. No evidence of bowel wall thickening, distention, or inflammatory changes. Vascular/Lymphatic: Aortic atherosclerosis. No enlarged abdominal or pelvic lymph nodes. Reproductive: Status post hysterectomy. No adnexal masses. Other: Negative for free air or free fluid.  Fat in the umbilicus Musculoskeletal: Degenerative changes. No acute or suspicious bone lesion IMPRESSION: 1. No CT evidence for acute intra-abdominal or pelvic abnormality 2. Hepatic steatosis Electronically Signed   By: Donavan Foil M.D.   On: 08/21/2017  02:17   Dg Chest Port 1 View  Result Date: 08/20/2017 CLINICAL DATA:  Nodes sepsis.  Fever.  Shortness of breath. EXAM: PORTABLE CHEST 1 VIEW COMPARISON:  None. FINDINGS: The heart size and mediastinal contours are within normal limits. Both lungs  are clear. The visualized skeletal structures are unremarkable. IMPRESSION: No active disease. Electronically Signed   By: Dorise Bullion III M.D   On: 08/20/2017 17:28   Dg Hip Unilat W Or Wo Pelvis 2-3 Views Right  Result Date: 08/20/2017 CLINICAL DATA:  Right hip pain.  No known injury. EXAM: DG HIP (WITH OR WITHOUT PELVIS) 2-3V RIGHT COMPARISON:  None. FINDINGS: There is no evidence of hip fracture or dislocation. There is no evidence of arthropathy or other focal bone abnormality. IMPRESSION: Normal examination. Electronically Signed   By: Claudie Revering M.D.   On: 08/20/2017 09:18    Lab Data:  CBC: Recent Labs  Lab 08/22/17 0444 08/23/17 0410  WBC 6.8 5.8  HGB 10.0* 10.2*  HCT 30.2* 30.3*  MCV 82.3 80.6  PLT 110* 798*   Basic Metabolic Panel: Recent Labs  Lab 08/23/17 0410 08/24/17 0544 08/25/17 0304 08/26/17 0342 08/27/17 0511 08/28/17 0423  NA 134* 137 137 137 135  --   K 3.8 3.6 3.0* 2.7* 3.3*  --   CL 106 106 103 101 100*  --   CO2 22 24 28 30 26   --   GLUCOSE 132* 118* 148* 125* 157*  --   BUN 9 6 6 6 6   --   CREATININE 0.96 0.84 0.73 0.73 0.74  --   CALCIUM 7.8* 8.0* 8.2* 8.2* 8.2*  --   MG  --  1.7 1.5* 1.4* 1.7 1.8   GFR: Estimated Creatinine Clearance: 83.8 mL/min (by C-G formula based on SCr of 0.74 mg/dL). Liver Function Tests: Recent Labs  Lab 08/22/17 0444  AST 24  ALT 15  ALKPHOS 69  BILITOT 0.5  PROT 5.6*  ALBUMIN 2.3*   No results for input(s): LIPASE, AMYLASE in the last 168 hours. No results for input(s): AMMONIA in the last 168 hours. Coagulation Profile: Recent Labs  Lab 08/24/17 0544  INR 1.05   Cardiac Enzymes: No results for input(s): CKTOTAL, CKMB, CKMBINDEX, TROPONINI in the last 168 hours. BNP (last 3 results) No results for input(s): PROBNP in the last 8760 hours. HbA1C: No results for input(s): HGBA1C in the last 72 hours. CBG: Recent Labs  Lab 08/27/17 0819 08/27/17 1157 08/27/17 1653 08/27/17 2246  08/28/17 0737  GLUCAP 129* 124* 100* 120* 126*   Lipid Profile: No results for input(s): CHOL, HDL, LDLCALC, TRIG, CHOLHDL, LDLDIRECT in the last 72 hours. Thyroid Function Tests: No results for input(s): TSH, T4TOTAL, FREET4, T3FREE, THYROIDAB in the last 72 hours. Anemia Panel: No results for input(s): VITAMINB12, FOLATE, FERRITIN, TIBC, IRON, RETICCTPCT in the last 72 hours. Urine analysis:    Component Value Date/Time   COLORURINE YELLOW 08/20/2017 1841   APPEARANCEUR CLEAR 08/20/2017 1841   LABSPEC 1.010 08/20/2017 1841   PHURINE 6.0 08/20/2017 1841   GLUCOSEU >=500 (A) 08/20/2017 1841   HGBUR NEGATIVE 08/20/2017 1841   HGBUR negative 09/03/2007 1610   BILIRUBINUR NEGATIVE 08/20/2017 1841   BILIRUBINUR neg 05/09/2016 1324   KETONESUR NEGATIVE 08/20/2017 1841   PROTEINUR NEGATIVE 08/20/2017 1841   UROBILINOGEN 0.2 05/09/2016 1324   UROBILINOGEN 1.0 02/09/2014 1257   NITRITE NEGATIVE 08/20/2017 1841   LEUKOCYTESUR NEGATIVE 08/20/2017 1841     Kyley Solow M.D. [facility] 08/28/2017, 10:46  AM  Pager: [phone] Between 7am to 7pm - call Pager - [phone]  After 7pm go to [email or web address] - password [email or web address]  Call night coverage person covering after 7pm

## 2017-08-29 LAB — CULTURE, BLOOD (ROUTINE X 2)
Culture: NO GROWTH
Culture: NO GROWTH
Special Requests: ADEQUATE
Special Requests: ADEQUATE

## 2017-08-30 ENCOUNTER — Telehealth: Payer: Self-pay

## 2017-08-30 NOTE — Telephone Encounter (Signed)
Called patient to do TCM states she is in a SNF for the next 4 weeks.

## 2017-08-31 DIAGNOSIS — R262 Difficulty in walking, not elsewhere classified: Secondary | ICD-10-CM | POA: Diagnosis not present

## 2017-08-31 DIAGNOSIS — M25551 Pain in right hip: Secondary | ICD-10-CM | POA: Diagnosis not present

## 2017-08-31 DIAGNOSIS — R5381 Other malaise: Secondary | ICD-10-CM | POA: Diagnosis not present

## 2017-08-31 DIAGNOSIS — M6281 Muscle weakness (generalized): Secondary | ICD-10-CM | POA: Diagnosis not present

## 2017-09-01 DIAGNOSIS — I10 Essential (primary) hypertension: Secondary | ICD-10-CM | POA: Diagnosis not present

## 2017-09-01 DIAGNOSIS — M25551 Pain in right hip: Secondary | ICD-10-CM | POA: Diagnosis not present

## 2017-09-01 DIAGNOSIS — E031 Congenital hypothyroidism without goiter: Secondary | ICD-10-CM | POA: Diagnosis not present

## 2017-09-01 DIAGNOSIS — R7881 Bacteremia: Secondary | ICD-10-CM | POA: Diagnosis not present

## 2017-09-06 ENCOUNTER — Other Ambulatory Visit: Payer: Self-pay | Admitting: *Deleted

## 2017-09-06 NOTE — Patient Outreach (Signed)
[facility]) Care Management  09/06/2017  TERICKA DEVINCENZI [birth year] [id number]   Spoke with Marita Kansas, SW at facility, she reports patient is on IV antibiotics for sepsis, scheduled through 09/21/17 She anticipates patient will remain at facility until then for IV medication administration/skilled nursing.  Plan to follow up closer to patient discharge and assess for any [facility] care management needs.  Royetta Crochet. Laymond Purser, RN, BSN, [place] [phone]) Business Cell  [phone]) Toll Free Office

## 2017-09-11 DIAGNOSIS — M6281 Muscle weakness (generalized): Secondary | ICD-10-CM | POA: Diagnosis not present

## 2017-09-11 DIAGNOSIS — A4101 Sepsis due to Methicillin susceptible Staphylococcus aureus: Secondary | ICD-10-CM | POA: Diagnosis not present

## 2017-09-11 DIAGNOSIS — E1165 Type 2 diabetes mellitus with hyperglycemia: Secondary | ICD-10-CM | POA: Diagnosis not present

## 2017-09-12 DIAGNOSIS — M25551 Pain in right hip: Secondary | ICD-10-CM | POA: Diagnosis not present

## 2017-09-12 DIAGNOSIS — M6281 Muscle weakness (generalized): Secondary | ICD-10-CM | POA: Diagnosis not present

## 2017-09-12 DIAGNOSIS — R262 Difficulty in walking, not elsewhere classified: Secondary | ICD-10-CM | POA: Diagnosis not present

## 2017-09-12 DIAGNOSIS — R5381 Other malaise: Secondary | ICD-10-CM | POA: Diagnosis not present

## 2017-09-13 ENCOUNTER — Other Ambulatory Visit: Payer: Self-pay | Admitting: *Deleted

## 2017-09-13 NOTE — Patient Outreach (Signed)
[facility]) Care Management  09/13/2017  JARETZY LHOMMEDIEU 03-18-1961 [id number]   Met with Marita Kansas the SW at facility. She reports patient should discharge end of next week after her IV meds are completed.   Met with Patient at bedside of facility.  Patient reports she is ready to go home.  She states she is independent at home.  She denies any issues with meds or transportation  She states her diabetes is controlled with diet.   RNCM reviewed [facility] program Patient denies any needs and declines services at this time.  RNCM left brochure and RNCM contact for future reference.  Plan to sign off. Royetta Crochet. Laymond Purser, RN, BSN, [place] [phone]) Business Cell  [phone]) Toll Free Office

## 2017-09-14 DIAGNOSIS — R262 Difficulty in walking, not elsewhere classified: Secondary | ICD-10-CM | POA: Diagnosis not present

## 2017-09-14 DIAGNOSIS — M6281 Muscle weakness (generalized): Secondary | ICD-10-CM | POA: Diagnosis not present

## 2017-09-14 DIAGNOSIS — M25551 Pain in right hip: Secondary | ICD-10-CM | POA: Diagnosis not present

## 2017-09-14 DIAGNOSIS — R5381 Other malaise: Secondary | ICD-10-CM | POA: Diagnosis not present

## 2017-09-27 ENCOUNTER — Telehealth: Payer: Self-pay | Admitting: Family Medicine

## 2017-09-27 NOTE — Telephone Encounter (Signed)
Copied from [place] #[id number]. Topic: Referral - Question >> Sep 27, 2017 12:32 PM Hewitt Shorts wrote: CRM for notification. See Telephone encounter for: pt home health nurse is needing to confirm orders from original referral since they have changed from the weekend due to weather   Best number [place]  09/27/17.

## 2017-09-29 DIAGNOSIS — I10 Essential (primary) hypertension: Secondary | ICD-10-CM | POA: Diagnosis not present

## 2017-09-29 DIAGNOSIS — M25551 Pain in right hip: Secondary | ICD-10-CM | POA: Diagnosis not present

## 2017-09-29 DIAGNOSIS — E119 Type 2 diabetes mellitus without complications: Secondary | ICD-10-CM | POA: Diagnosis not present

## 2017-09-29 DIAGNOSIS — M6281 Muscle weakness (generalized): Secondary | ICD-10-CM | POA: Diagnosis not present

## 2017-09-29 DIAGNOSIS — F5101 Primary insomnia: Secondary | ICD-10-CM | POA: Diagnosis not present

## 2017-09-29 DIAGNOSIS — K219 Gastro-esophageal reflux disease without esophagitis: Secondary | ICD-10-CM | POA: Diagnosis not present

## 2017-09-29 DIAGNOSIS — Z9181 History of falling: Secondary | ICD-10-CM | POA: Diagnosis not present

## 2017-09-29 DIAGNOSIS — M549 Dorsalgia, unspecified: Secondary | ICD-10-CM | POA: Diagnosis not present

## 2017-10-03 ENCOUNTER — Telehealth: Payer: Self-pay | Admitting: *Deleted

## 2017-10-03 DIAGNOSIS — M6281 Muscle weakness (generalized): Secondary | ICD-10-CM | POA: Diagnosis not present

## 2017-10-03 DIAGNOSIS — Z9181 History of falling: Secondary | ICD-10-CM | POA: Diagnosis not present

## 2017-10-03 DIAGNOSIS — I10 Essential (primary) hypertension: Secondary | ICD-10-CM | POA: Diagnosis not present

## 2017-10-03 DIAGNOSIS — M25551 Pain in right hip: Secondary | ICD-10-CM | POA: Diagnosis not present

## 2017-10-03 DIAGNOSIS — E119 Type 2 diabetes mellitus without complications: Secondary | ICD-10-CM | POA: Diagnosis not present

## 2017-10-03 DIAGNOSIS — F5101 Primary insomnia: Secondary | ICD-10-CM | POA: Diagnosis not present

## 2017-10-03 DIAGNOSIS — K219 Gastro-esophageal reflux disease without esophagitis: Secondary | ICD-10-CM | POA: Diagnosis not present

## 2017-10-03 DIAGNOSIS — M549 Dorsalgia, unspecified: Secondary | ICD-10-CM | POA: Diagnosis not present

## 2017-10-03 NOTE — Telephone Encounter (Signed)
Received Physician Orders/Medication Issue Communication from [facility]; forwarded to provider/SLS 12/18

## 2017-10-05 ENCOUNTER — Ambulatory Visit (INDEPENDENT_AMBULATORY_CARE_PROVIDER_SITE_OTHER): Payer: Medicare Other | Admitting: Family Medicine

## 2017-10-05 ENCOUNTER — Encounter: Payer: Self-pay | Admitting: Family Medicine

## 2017-10-05 VITALS — BP 116/80 | HR 77 | Temp 98.1°F | Resp 16 | Ht 63.0 in | Wt 198.0 lb

## 2017-10-05 DIAGNOSIS — Z23 Encounter for immunization: Secondary | ICD-10-CM | POA: Diagnosis not present

## 2017-10-05 DIAGNOSIS — I10 Essential (primary) hypertension: Secondary | ICD-10-CM | POA: Diagnosis not present

## 2017-10-05 DIAGNOSIS — E785 Hyperlipidemia, unspecified: Secondary | ICD-10-CM

## 2017-10-05 DIAGNOSIS — G4700 Insomnia, unspecified: Secondary | ICD-10-CM | POA: Diagnosis not present

## 2017-10-05 DIAGNOSIS — E1165 Type 2 diabetes mellitus with hyperglycemia: Secondary | ICD-10-CM | POA: Diagnosis not present

## 2017-10-05 DIAGNOSIS — A419 Sepsis, unspecified organism: Secondary | ICD-10-CM

## 2017-10-05 LAB — CBC WITH DIFFERENTIAL/PLATELET
Basophils Absolute: 0 10*3/uL (ref 0.0–0.1)
Basophils Relative: 0.3 % (ref 0.0–3.0)
Eosinophils Absolute: 0.2 10*3/uL (ref 0.0–0.7)
Eosinophils Relative: 3.1 % (ref 0.0–5.0)
HCT: 36.5 % (ref 36.0–46.0)
Hemoglobin: 12 g/dL (ref 12.0–15.0)
Lymphocytes Relative: 41.2 % (ref 12.0–46.0)
Lymphs Abs: 2.1 10*3/uL (ref 0.7–4.0)
MCHC: 32.8 g/dL (ref 30.0–36.0)
MCV: 85.2 fl (ref 78.0–100.0)
Monocytes Absolute: 0.6 10*3/uL (ref 0.1–1.0)
Monocytes Relative: 12.2 % — ABNORMAL HIGH (ref 3.0–12.0)
Neutro Abs: 2.2 10*3/uL (ref 1.4–7.7)
Neutrophils Relative %: 43.2 % (ref 43.0–77.0)
Platelets: 340 10*3/uL (ref 150.0–400.0)
RBC: 4.28 Mil/uL (ref 3.87–5.11)
RDW: 16.1 % — ABNORMAL HIGH (ref 11.5–15.5)
WBC: 5 10*3/uL (ref 4.0–10.5)

## 2017-10-05 LAB — COMPREHENSIVE METABOLIC PANEL
ALT: 7 U/L (ref 0–35)
AST: 10 U/L (ref 0–37)
Albumin: 4 g/dL (ref 3.5–5.2)
Alkaline Phosphatase: 93 U/L (ref 39–117)
BUN: 18 mg/dL (ref 6–23)
CO2: 32 mEq/L (ref 19–32)
Calcium: 9.4 mg/dL (ref 8.4–10.5)
Chloride: 97 mEq/L (ref 96–112)
Creatinine, Ser: 1.05 mg/dL (ref 0.40–1.20)
GFR: 69.99 mL/min (ref >60.00)
Glucose, Bld: 253 mg/dL — ABNORMAL HIGH (ref 70–99)
Potassium: 3.2 mEq/L — ABNORMAL LOW (ref 3.5–5.1)
Sodium: 136 mEq/L (ref 135–145)
Total Bilirubin: 0.5 mg/dL (ref 0.2–1.2)
Total Protein: 8.5 g/dL — ABNORMAL HIGH (ref 6.0–8.3)

## 2017-10-05 MED ORDER — TRAZODONE HCL 50 MG PO TABS: 25.0000 mg | ORAL_TABLET | Freq: Every evening | ORAL | 3 refills | Status: DC | PRN

## 2017-10-05 NOTE — Assessment & Plan Note (Addendum)
Pt doing well with pt/ot  Cultures neg Finished with abx Walking with walker

## 2017-10-05 NOTE — Assessment & Plan Note (Signed)
hgba1c was elevated, minimize simple carbs. Increase exercise as tolerated. Continue current meds Pt was in hosp with sepsis

## 2017-10-05 NOTE — Patient Instructions (Signed)
Sepsis, Adult Sepsis is a serious bodily reaction to an infection. The infection that causes sepsis may be from a bacteria, a virus, a fungus, or a parasite. Sepsis can result from an infection in any part of the body. Infections that commonly lead to sepsis include skin, lung, and urinary tract infections. Sepsis is a medical emergency that requires immediate treatment at the hospital. In severe cases, it can lead to septic shock. Shock can weaken the heart and cause blood pressure to drop. This can make the central nervous system and the body's organs to stop working. What are the causes? This condition is caused by a severe reaction to a bacterial, viral, fungal, or parasitic infection. The germs that most commonly lead to sepsis include:  Escherichia coli (E. coli).  Staphylococcus aureus (staph).  The most common infections that lead to sepsis include infections of:  The skin.  The lung (pneumonia).  The gut.  The kidneys (urinary tract infection).  What increases the risk? You are more likely to develop this condition if:  You have a weakened disease-fighting (immune) system.  You are 65 or older.  You are female.  You had surgery, or you have been hospitalized.  You have a catheter, breathing tube, or drainage tubes inserted into your body.  You are not getting enough nutrients from food (are malnourished).  You have other long-term (chronic) diseases, including: ? Cancer. ? AIDS. ? Liver disease. ? Lung disease. ? Diabetes.  You have severe burns or injuries.  You inject drugs.  You have heart valve problems.  What are the signs or symptoms? Symptoms of this condition may include:  Fever.  Chills or feeling very cold.  Fast heart rate (tachycardia).  Rapid breathing (hyperventilation).  Shortness of breath.  Confusion or light-headedness.  Changes in skin color. Your skin may look blotchy, pale, or blue.  Cool, clammy skin or sweaty skin.  Skin  rash.  Nausea and vomiting.  Urinating much less than usual.  How is this diagnosed? This condition is diagnosed based on:  Your symptoms.  Your medical history.  A physical exam.  Other tests may also be done to find out the cause of the infection and how severe the sepsis is. These tests may include:  Blood tests.  Urine tests.  Swabs from other areas of the body that may have an infection. These samples may be tested (cultured) to find out what type of bacteria is causing the infection.  Chest X-ray to check for pneumonia. Other imaging tests, such as a CT scan, may also be done.  Lumbar puncture. This is a procedure to remove a small amount of the fluid that surrounds the brain and spinal cord. The fluid is then examined for infection.  How is this treated? This condition is treated in a hospital with antibiotic medicines. You may also receive:  Fluids through an IV tube.  Oxygen and breathing assistance.  Kidney dialysis. This process cleans the blood if the kidneys have failed.  Surgery to remove infected tissue.  Medicines to increase your blood pressure.  Nutrients to correct imbalances in basic body function (metabolism). This may involve receiving important salts and minerals (electrolytes) through an IV and having your blood sugar level adjusted.  Steroid medicines to control your body's reaction to the infection.  Follow these instructions at home: Medicines  Take over-the-counter and prescription medicines only as told by your health care provider.  If you were prescribed an antibiotic or anti-fungal medicine, take it   as told by your health care provider. Do not stop taking the antibiotic or anti-fungal medicine even if you start to feel better. Activity  Rest and gradually return to your normal activities. Ask your health care provider what activities are safe for you.  Try to set small, achievable goals each week, such as dressing yourself,  bathing, or walking up stairs. It may take a while to rebuild your strength.  Try to exercise regularly, if you feel healthy enough to do so. Ask your health care provider what exercises are safe for you. General instructions  Drink enough fluid to keep your urine clear or pale yellow.  Eat a healthy, balanced diet. This includes plenty of fruits and vegetables, whole grains, and lowfat (lean) proteins. Ask your health care provider if you should avoid certain foods.  Keep all follow-up visits as told by your health care provider. This is important. Contact a health care provider if:  You do not feel like you are getting better or regaining strength.  You are having trouble coping with your recovery.  You frequently feel tired.  You feel worse or do not seem to get better after surgery.  You think you may have an infection after surgery. Get help right away if:  You have any symptoms of sepsis.  You have difficulty breathing.  You have a rapid or skipping heartbeat.  You become confused.  You have a high fever.  Your skin becomes blotchy, pale, or blue. These symptoms may represent a serious problem that is an emergency. Do not wait to see if the symptoms will go away. Get medical help right away. Call your local emergency services (911 in the [place] Summary  Sepsis is a medical emergency that requires immediate treatment at the hospital.  This condition is caused by a severe reaction to a bacterial, viral, fungal, or parasitic infection.  This condition is treated in a hospital with antibiotics. Treatment may also include IV fluids, breathing assistance, and kidney dialysis.  If you were prescribed an antibiotic or anti-fungal medicine, take it as told by your health care provider. Do not stop taking the antibiotic or anti-fungal medicine even if you start to feel better. This information is not intended to replace advice given to you by your health care provider. Make  sure you discuss any questions you have with your health care provider. Document Released: 07/02/2003 Document Revised: 09/06/2016 Document Reviewed: 09/06/2016 Elsevier Interactive Patient Education  2018 Elsevier Inc.  

## 2017-10-05 NOTE — Progress Notes (Signed)
Patient ID: Heather Walker, female   DOB: Jun 27, 1961, 56 y.o.   MRN: [id number]     Subjective:  I acted as a Education administrator for Dr. Carollee Herter.  Guerry Bruin, [place]   Patient ID: Heather Walker, female    DOB: February 07, 1961, 56 y.o.   MRN: [id number]  Chief Complaint  Patient presents with  . Follow-up    HPI  Patient is in today for follow up rehab for hip and back.  Patient is doing a lot better.  Pt wwas d/c from rehab dec 6.  She was in the hosp 11/4-11/12. With sepsis and hip/back pain.  She was d/c to NH for rehab . Pt is now getting pt/ot at home.     Patient Care Team: Carollee Herter, Alferd Apa, DO as PCP - General (Family Medicine)   Past Medical History:  Diagnosis Date  . Arthritis   . Asthma   . Depression   . Diabetes mellitus without complication ([place])   . Heart murmur   . Hypertension     Past Surgical History:  Procedure Laterality Date  . ABDOMINAL HYSTERECTOMY    . APPENDECTOMY    . BLADDER SUSPENSION    . CESAREAN SECTION     3 previous  . TEE WITHOUT CARDIOVERSION N/A 08/24/2017   Procedure: TRANSESOPHAGEAL ECHOCARDIOGRAM (TEE);  Surgeon: Larey Dresser, MD;  Location[facility] ENDOSCOPY;  Service: Cardiovascular;  Laterality: N/A;  . TUBAL LIGATION      Family History  Problem Relation Age of Onset  . Hypertension Mother   . Asthma Mother   . Hypertension Sister   . Asthma Sister   . Hypertension Maternal Grandmother   . Heart defect Sister   . Asthma Sister   . Aneurysm Sister   . Asthma Sister     Social History   Socioeconomic History  . Marital status: Married    Spouse name: Not on file  . Number of children: Not on file  . Years of education: ged  . Highest education level: Not on file  Social Needs  . Financial resource strain: Not on file  . Food insecurity - worry: Not on file  . Food insecurity - inability: Not on file  . Transportation needs - medical: Not on file  . Transportation needs - non-medical: Not on file  Occupational History  .  Occupation: disabled  Tobacco Use  . Smoking status: Never Smoker  . Smokeless tobacco: Never Used  Substance and Sexual Activity  . Alcohol use: No  . Drug use: No  . Sexual activity: Yes    Partners: Male    Birth control/protection: Surgical  Other Topics Concern  . Not on file  Social History Narrative  . Not on file    Outpatient Medications Prior to Visit  Medication Sig Dispense Refill  . albuterol (PROVENTIL HFA;VENTOLIN HFA) 108 (90 BASE) MCG/ACT inhaler Inhale 2 puffs into the lungs every 6 (six) hours as needed. For shortness of breath. 1 Inhaler 4  . albuterol (PROVENTIL) (2.5 MG/3ML) 0.083% nebulizer solution Take 3 mLs (2.5 mg total) by nebulization every 6 (six) hours as needed for wheezing or shortness of breath. 150 mL 1  . ceFAZolin (ANCEF) IVPB Inject 2 g every 8 (eight) hours into the vein. Indication:  MSSA bacteremia Last Day of Therapy:  09/21/17 - or 4 weeks from negative blood cultures (08/24/17 cultures negative to date) Labs - Once weekly:  CBC/D and BMP, Labs - Every other week:  ESR and CRP  78 Units 0  . cloNIDine (CATAPRES) 0.2 MG tablet Take 1 tablet (0.2 mg total) by mouth 2 (two) times daily. 180 tablet 1  . cyclobenzaprine (FLEXERIL) 5 MG tablet Take 1 tablet (5 mg total) 3 (three) times daily as needed by mouth for muscle spasms. 30 tablet 1  . empagliflozin (JARDIANCE) 10 MG TABS tablet Take 10 mg by mouth daily. 30 tablet 3  . olmesartan (BENICAR) 20 MG tablet Take 1 tablet (20 mg total) by mouth daily. 30 tablet 2  . omeprazole (PRILOSEC) 20 MG capsule Take 1 capsule (20 mg total) by mouth daily. 30 capsule 3  . Potassium Chloride ER 20 MEQ TBCR Take 40 mEq by mouth daily. 60 tablet 2  . saxagliptin HCl (ONGLYZA) 5 MG TABS tablet Take 1 tablet (5 mg total) by mouth daily. 30 tablet 2  . traMADol (ULTRAM) 50 MG tablet Take 1 tablet (50 mg total) every 6 (six) hours as needed by mouth for moderate pain or severe pain. 30 tablet 0  .  triamterene-hydrochlorothiazide (MAXZIDE) 75-50 MG tablet Take 1 tablet by mouth daily. 90 tablet 0  . traZODone (DESYREL) 50 MG tablet Take 0.5-1 tablets (25-50 mg total) by mouth at bedtime as needed for sleep. 30 tablet 3   No facility-administered medications prior to visit.     Allergies  Allergen Reactions  . Atorvastatin Other (See Comments)    Neck stiffness  . Codeine Itching    Review of Systems  Constitutional: Negative for fever and malaise/fatigue.  HENT: Negative for congestion.   Eyes: Negative for blurred vision.  Respiratory: Negative for cough and shortness of breath.   Cardiovascular: Negative for chest pain, palpitations and leg swelling.  Gastrointestinal: Negative for vomiting.  Musculoskeletal: Negative for back pain.  Skin: Negative for rash.  Neurological: Negative for loss of consciousness and headaches.       Objective:    Physical Exam  Constitutional: She is oriented to person, place, and time. She appears well-developed and well-nourished.  HENT:  Head: Normocephalic and atraumatic.  Eyes: Conjunctivae and EOM are normal.  Neck: Normal range of motion. Neck supple. No JVD present. Carotid bruit is not present. No thyromegaly present.  Cardiovascular: Normal rate, regular rhythm and normal heart sounds.  No murmur heard. Pulmonary/Chest: Effort normal and breath sounds normal. No respiratory distress. She has no wheezes. She has no rales. She exhibits no tenderness.  Musculoskeletal: She exhibits no edema.  Neurological: She is alert and oriented to person, place, and time.  Psychiatric: She has a normal mood and affect.  Nursing note and vitals reviewed.   BP 116/80 (BP Location: Left Arm, Cuff Size: Normal)   Pulse 77   Temp 98.1 F (36.7 C) (Oral)   Resp 16   Ht _0  (1.6 m)   Wt 198 lb (89.8 kg)   SpO2 98%   BMI 35.07 kg/m  Wt Readings from Last 3 Encounters:  10/05/17 198 lb (89.8 kg)  08/24/17 198 lb (89.8 kg)  08/20/17 189  lb (85.7 kg)   BP Readings from Last 3 Encounters:  10/05/17 116/80  08/28/17 (!) 145/88  08/20/17 132/70     Immunization History  Administered Date(s) Administered  . Influenza,inj,Quad PF,6+ Mos 07/25/2014, 10/05/2015, 10/05/2017  . Pneumococcal Polysaccharide-23 07/15/2014  . Td 04/12/2006  . Tdap 02/24/2011    Health Maintenance  Topic Date Due  . INFLUENZA VACCINE  05/17/2017  . COLONOSCOPY  11/10/2017 (Originally 10/26/2012)  . FOOT EXAM  11/10/2017  .  MAMMOGRAM  11/10/2017  . OPHTHALMOLOGY EXAM  11/29/2017  . HEMOGLOBIN A1C  02/22/2018  . PNEUMOCOCCAL POLYSACCHARIDE VACCINE (2) 07/16/2019  . TETANUS/TDAP  02/23/2021  . Hepatitis C Screening  Completed  . HIV Screening  Completed    Lab Results  Component Value Date   WBC 5.8 08/23/2017   HGB 10.2 (L) 08/23/2017   HCT 30.3 (L) 08/23/2017   PLT 136 (L) 08/23/2017   GLUCOSE 157 (H) 08/27/2017   CHOL 150 05/02/2017   TRIG 142.0 05/02/2017   HDL 55.20 05/02/2017   LDLDIRECT 103.0 11/10/2016   LDLCALC 66 05/02/2017   ALT 15 08/22/2017   AST 24 08/22/2017   NA 135 08/27/2017   K 3.3 (L) 08/27/2017   CL 100 (L) 08/27/2017   CREATININE 0.74 08/27/2017   BUN 6 08/27/2017   CO2 26 08/27/2017   TSH 1.124 12/08/2014   INR 1.05 08/24/2017   HGBA1C 8.9 (H) 08/25/2017   MICROALBUR 1.3 11/10/2016    Lab Results  Component Value Date   TSH 1.124 12/08/2014   Lab Results  Component Value Date   WBC 5.8 08/23/2017   HGB 10.2 (L) 08/23/2017   HCT 30.3 (L) 08/23/2017   MCV 80.6 08/23/2017   PLT 136 (L) 08/23/2017   Lab Results  Component Value Date   NA 135 08/27/2017   K 3.3 (L) 08/27/2017   CO2 26 08/27/2017   GLUCOSE 157 (H) 08/27/2017   BUN 6 08/27/2017   CREATININE 0.74 08/27/2017   BILITOT 0.5 08/22/2017   ALKPHOS 69 08/22/2017   AST 24 08/22/2017   ALT 15 08/22/2017   PROT 5.6 (L) 08/22/2017   ALBUMIN 2.3 (L) 08/22/2017   CALCIUM 8.2 (L) 08/27/2017   ANIONGAP 9 08/27/2017   GFR 85.95  05/02/2017   Lab Results  Component Value Date   CHOL 150 05/02/2017   Lab Results  Component Value Date   HDL 55.20 05/02/2017   Lab Results  Component Value Date   LDLCALC 66 05/02/2017   Lab Results  Component Value Date   TRIG 142.0 05/02/2017   Lab Results  Component Value Date   CHOLHDL 3 05/02/2017   Lab Results  Component Value Date   HGBA1C 8.9 (H) 08/25/2017         Assessment & Plan:   Problem List Items Addressed This Visit      Unprioritized   Diabetes mellitus type II, uncontrolled ([place]) (Chronic)    hgba1c was elevated, minimize simple carbs. Increase exercise as tolerated. Continue current meds Pt was in hosp with sepsis       Relevant Orders   CBC with Differential/Platelet   Comprehensive metabolic panel   Essential hypertension (Chronic)    Well controlled, no changes to meds. Encouraged heart healthy diet such as the DASH diet and exercise as tolerated.       Relevant Orders   CBC with Differential/Platelet   Comprehensive metabolic panel   Insomnia   Relevant Medications   traZODone (DESYREL) 50 MG tablet   Sepsis ([place]) - Primary    Pt doing well with pt/ot  Cultures neg Finished with abx Walking with walker        Other Visit Diagnoses    Hyperlipidemia, unspecified hyperlipidemia type       Relevant Orders   CBC with Differential/Platelet   Comprehensive metabolic panel   Influenza vaccine administered       Relevant Orders   Flu Vaccine QUAD 36+ mos IM ([employer]  PF (Completed)      I am having Connye Burkitt. [place] maintain her albuterol, albuterol, saxagliptin HCl, olmesartan, cloNIDine, omeprazole, Potassium Chloride ER, triamterene-hydrochlorothiazide, empagliflozin, ceFAZolin, cyclobenzaprine, traMADol, and traZODone.  Meds ordered this encounter  Medications  . traZODone (DESYREL) 50 MG tablet    Sig: Take 0.5-1 tablets (25-50 mg total) by mouth at bedtime as needed for sleep.    Dispense:  30 tablet      Refill:  3    CMA served as scribe during this visit. History, Physical and Plan performed by medical provider. Documentation and orders reviewed and attested to.  [name], DO

## 2017-10-05 NOTE — Assessment & Plan Note (Signed)
Well controlled, no changes to meds. Encouraged heart healthy diet such as the DASH diet and exercise as tolerated.  °

## 2017-10-06 DIAGNOSIS — F5101 Primary insomnia: Secondary | ICD-10-CM | POA: Diagnosis not present

## 2017-10-06 DIAGNOSIS — M549 Dorsalgia, unspecified: Secondary | ICD-10-CM | POA: Diagnosis not present

## 2017-10-06 DIAGNOSIS — M25551 Pain in right hip: Secondary | ICD-10-CM | POA: Diagnosis not present

## 2017-10-06 DIAGNOSIS — E119 Type 2 diabetes mellitus without complications: Secondary | ICD-10-CM | POA: Diagnosis not present

## 2017-10-06 DIAGNOSIS — K219 Gastro-esophageal reflux disease without esophagitis: Secondary | ICD-10-CM | POA: Diagnosis not present

## 2017-10-06 DIAGNOSIS — Z9181 History of falling: Secondary | ICD-10-CM | POA: Diagnosis not present

## 2017-10-06 DIAGNOSIS — M6281 Muscle weakness (generalized): Secondary | ICD-10-CM | POA: Diagnosis not present

## 2017-10-06 DIAGNOSIS — I10 Essential (primary) hypertension: Secondary | ICD-10-CM | POA: Diagnosis not present

## 2017-10-09 ENCOUNTER — Other Ambulatory Visit: Payer: Self-pay

## 2017-10-09 ENCOUNTER — Telehealth: Payer: Self-pay | Admitting: Family Medicine

## 2017-10-09 DIAGNOSIS — M6281 Muscle weakness (generalized): Secondary | ICD-10-CM | POA: Diagnosis not present

## 2017-10-09 DIAGNOSIS — Z9181 History of falling: Secondary | ICD-10-CM | POA: Diagnosis not present

## 2017-10-09 DIAGNOSIS — I10 Essential (primary) hypertension: Secondary | ICD-10-CM | POA: Diagnosis not present

## 2017-10-09 DIAGNOSIS — E119 Type 2 diabetes mellitus without complications: Secondary | ICD-10-CM | POA: Diagnosis not present

## 2017-10-09 DIAGNOSIS — M549 Dorsalgia, unspecified: Secondary | ICD-10-CM | POA: Diagnosis not present

## 2017-10-09 DIAGNOSIS — F5101 Primary insomnia: Secondary | ICD-10-CM | POA: Diagnosis not present

## 2017-10-09 DIAGNOSIS — K219 Gastro-esophageal reflux disease without esophagitis: Secondary | ICD-10-CM | POA: Diagnosis not present

## 2017-10-09 DIAGNOSIS — M25551 Pain in right hip: Secondary | ICD-10-CM | POA: Diagnosis not present

## 2017-10-09 NOTE — Telephone Encounter (Signed)
Inc benicar 40 mg  Daily #30  1 po qd , 2 refills

## 2017-10-09 NOTE — Telephone Encounter (Signed)
Spoke with Elta Guadeloupe at [facility] patient had not taken her BP medication when he arived this am but after about 45 min it was still 158/110. Says patient is non-symptomatic. Advised him that we are closing at 67 today and patient will need to go to ED if BP does not come down. Called patient as well but answering machjne came on. Left her message regarding ED.

## 2017-10-09 NOTE — Telephone Encounter (Signed)
Heather Walker, [facility] Therapist for [name] phoned to report high blood pressure for patient.  Today's readings: 160/110 average of three readings over 30 minutes.  Patient took Clonidine and Maxzide around 11:00am and at 11:35 her pressure had not come down.  He reports she is asymptomatic this morning.  Please advise.

## 2017-10-09 NOTE — Telephone Encounter (Signed)
Copied from [place]. Topic: Inquiry >> Oct 03, 2017  2:23 PM Pricilla Handler wrote: Reason for CRM: Clair Gulling from [facility] [phone]) called requesting verbal orders to follow. Please call Clair Gulling at [phone].       Thank You!!!  >> Oct 03, 2017  3:27 PM Damita Dunnings, CMA wrote: [place] Me[place] that I spoke w/ Dianah Field giving PT orders, unsure what kind of orders he was needing as not in original message. Instructed him to call office at his convenience.  >> Oct 09, 2017 12:07 PM Oneta Rack wrote: Patient returned call stating nurse from [facility] called to inform PCP BP was 160/110  this morning, please call nurse Clair Gulling from [facility] [phone] back regarding clinical advice.

## 2017-10-11 DIAGNOSIS — M6281 Muscle weakness (generalized): Secondary | ICD-10-CM | POA: Diagnosis not present

## 2017-10-11 DIAGNOSIS — Z9181 History of falling: Secondary | ICD-10-CM | POA: Diagnosis not present

## 2017-10-11 DIAGNOSIS — M25551 Pain in right hip: Secondary | ICD-10-CM | POA: Diagnosis not present

## 2017-10-11 DIAGNOSIS — F5101 Primary insomnia: Secondary | ICD-10-CM | POA: Diagnosis not present

## 2017-10-11 DIAGNOSIS — M549 Dorsalgia, unspecified: Secondary | ICD-10-CM | POA: Diagnosis not present

## 2017-10-11 DIAGNOSIS — I10 Essential (primary) hypertension: Secondary | ICD-10-CM | POA: Diagnosis not present

## 2017-10-11 DIAGNOSIS — E119 Type 2 diabetes mellitus without complications: Secondary | ICD-10-CM | POA: Diagnosis not present

## 2017-10-11 DIAGNOSIS — K219 Gastro-esophageal reflux disease without esophagitis: Secondary | ICD-10-CM | POA: Diagnosis not present

## 2017-10-11 MED ORDER — OLMESARTAN MEDOXOMIL 40 MG PO TABS: 40.0000 mg | ORAL_TABLET | Freq: Every day | ORAL | 2 refills | Status: DC

## 2017-10-11 NOTE — Telephone Encounter (Signed)
Benicar Rx revised and sent in as advised

## 2017-10-11 NOTE — Addendum Note (Signed)
Addended by: Roma Kayser on: 10/11/2017 01:43 PM   Modules accepted: Orders

## 2017-10-13 DIAGNOSIS — M6281 Muscle weakness (generalized): Secondary | ICD-10-CM | POA: Diagnosis not present

## 2017-10-13 DIAGNOSIS — M549 Dorsalgia, unspecified: Secondary | ICD-10-CM | POA: Diagnosis not present

## 2017-10-13 DIAGNOSIS — K219 Gastro-esophageal reflux disease without esophagitis: Secondary | ICD-10-CM | POA: Diagnosis not present

## 2017-10-13 DIAGNOSIS — F5101 Primary insomnia: Secondary | ICD-10-CM | POA: Diagnosis not present

## 2017-10-13 DIAGNOSIS — E119 Type 2 diabetes mellitus without complications: Secondary | ICD-10-CM | POA: Diagnosis not present

## 2017-10-13 DIAGNOSIS — I10 Essential (primary) hypertension: Secondary | ICD-10-CM | POA: Diagnosis not present

## 2017-10-13 DIAGNOSIS — M25551 Pain in right hip: Secondary | ICD-10-CM | POA: Diagnosis not present

## 2017-10-13 DIAGNOSIS — Z9181 History of falling: Secondary | ICD-10-CM | POA: Diagnosis not present

## 2017-10-18 DIAGNOSIS — I10 Essential (primary) hypertension: Secondary | ICD-10-CM | POA: Diagnosis not present

## 2017-10-18 DIAGNOSIS — F5101 Primary insomnia: Secondary | ICD-10-CM | POA: Diagnosis not present

## 2017-10-18 DIAGNOSIS — K219 Gastro-esophageal reflux disease without esophagitis: Secondary | ICD-10-CM | POA: Diagnosis not present

## 2017-10-18 DIAGNOSIS — Z9181 History of falling: Secondary | ICD-10-CM | POA: Diagnosis not present

## 2017-10-18 DIAGNOSIS — M549 Dorsalgia, unspecified: Secondary | ICD-10-CM | POA: Diagnosis not present

## 2017-10-18 DIAGNOSIS — M25551 Pain in right hip: Secondary | ICD-10-CM | POA: Diagnosis not present

## 2017-10-18 DIAGNOSIS — M6281 Muscle weakness (generalized): Secondary | ICD-10-CM | POA: Diagnosis not present

## 2017-10-18 DIAGNOSIS — E119 Type 2 diabetes mellitus without complications: Secondary | ICD-10-CM | POA: Diagnosis not present

## 2017-10-19 ENCOUNTER — Telehealth: Payer: Self-pay | Admitting: *Deleted

## 2017-10-19 NOTE — Telephone Encounter (Signed)
Received Physician Orders from [place]; forwarded to provider/SLS 01/03

## 2017-10-20 DIAGNOSIS — Z9181 History of falling: Secondary | ICD-10-CM | POA: Diagnosis not present

## 2017-10-20 DIAGNOSIS — M25551 Pain in right hip: Secondary | ICD-10-CM | POA: Diagnosis not present

## 2017-10-20 DIAGNOSIS — F5101 Primary insomnia: Secondary | ICD-10-CM | POA: Diagnosis not present

## 2017-10-20 DIAGNOSIS — M549 Dorsalgia, unspecified: Secondary | ICD-10-CM | POA: Diagnosis not present

## 2017-10-20 DIAGNOSIS — I10 Essential (primary) hypertension: Secondary | ICD-10-CM | POA: Diagnosis not present

## 2017-10-20 DIAGNOSIS — K219 Gastro-esophageal reflux disease without esophagitis: Secondary | ICD-10-CM | POA: Diagnosis not present

## 2017-10-20 DIAGNOSIS — M6281 Muscle weakness (generalized): Secondary | ICD-10-CM | POA: Diagnosis not present

## 2017-10-20 DIAGNOSIS — E119 Type 2 diabetes mellitus without complications: Secondary | ICD-10-CM | POA: Diagnosis not present

## 2017-10-23 DIAGNOSIS — M6281 Muscle weakness (generalized): Secondary | ICD-10-CM | POA: Diagnosis not present

## 2017-10-23 DIAGNOSIS — M25551 Pain in right hip: Secondary | ICD-10-CM | POA: Diagnosis not present

## 2017-10-23 DIAGNOSIS — E119 Type 2 diabetes mellitus without complications: Secondary | ICD-10-CM | POA: Diagnosis not present

## 2017-10-23 DIAGNOSIS — M549 Dorsalgia, unspecified: Secondary | ICD-10-CM | POA: Diagnosis not present

## 2017-10-23 DIAGNOSIS — F5101 Primary insomnia: Secondary | ICD-10-CM | POA: Diagnosis not present

## 2017-10-23 DIAGNOSIS — I10 Essential (primary) hypertension: Secondary | ICD-10-CM | POA: Diagnosis not present

## 2017-10-23 DIAGNOSIS — K219 Gastro-esophageal reflux disease without esophagitis: Secondary | ICD-10-CM | POA: Diagnosis not present

## 2017-10-23 DIAGNOSIS — Z9181 History of falling: Secondary | ICD-10-CM | POA: Diagnosis not present

## 2017-10-24 DIAGNOSIS — Z9181 History of falling: Secondary | ICD-10-CM | POA: Diagnosis not present

## 2017-10-24 DIAGNOSIS — M25551 Pain in right hip: Secondary | ICD-10-CM | POA: Diagnosis not present

## 2017-10-24 DIAGNOSIS — K219 Gastro-esophageal reflux disease without esophagitis: Secondary | ICD-10-CM | POA: Diagnosis not present

## 2017-10-24 DIAGNOSIS — M6281 Muscle weakness (generalized): Secondary | ICD-10-CM | POA: Diagnosis not present

## 2017-10-24 DIAGNOSIS — E119 Type 2 diabetes mellitus without complications: Secondary | ICD-10-CM | POA: Diagnosis not present

## 2017-10-24 DIAGNOSIS — M549 Dorsalgia, unspecified: Secondary | ICD-10-CM | POA: Diagnosis not present

## 2017-10-24 DIAGNOSIS — I10 Essential (primary) hypertension: Secondary | ICD-10-CM | POA: Diagnosis not present

## 2017-10-24 DIAGNOSIS — F5101 Primary insomnia: Secondary | ICD-10-CM | POA: Diagnosis not present

## 2017-10-25 DIAGNOSIS — I10 Essential (primary) hypertension: Secondary | ICD-10-CM | POA: Diagnosis not present

## 2017-10-25 DIAGNOSIS — F5101 Primary insomnia: Secondary | ICD-10-CM | POA: Diagnosis not present

## 2017-10-25 DIAGNOSIS — K219 Gastro-esophageal reflux disease without esophagitis: Secondary | ICD-10-CM | POA: Diagnosis not present

## 2017-10-25 DIAGNOSIS — M549 Dorsalgia, unspecified: Secondary | ICD-10-CM | POA: Diagnosis not present

## 2017-10-25 DIAGNOSIS — M6281 Muscle weakness (generalized): Secondary | ICD-10-CM | POA: Diagnosis not present

## 2017-10-25 DIAGNOSIS — M25551 Pain in right hip: Secondary | ICD-10-CM | POA: Diagnosis not present

## 2017-10-25 DIAGNOSIS — E119 Type 2 diabetes mellitus without complications: Secondary | ICD-10-CM | POA: Diagnosis not present

## 2017-10-25 DIAGNOSIS — Z9181 History of falling: Secondary | ICD-10-CM | POA: Diagnosis not present

## 2017-10-30 DIAGNOSIS — M549 Dorsalgia, unspecified: Secondary | ICD-10-CM | POA: Diagnosis not present

## 2017-10-30 DIAGNOSIS — E119 Type 2 diabetes mellitus without complications: Secondary | ICD-10-CM | POA: Diagnosis not present

## 2017-10-30 DIAGNOSIS — I10 Essential (primary) hypertension: Secondary | ICD-10-CM | POA: Diagnosis not present

## 2017-10-30 DIAGNOSIS — M6281 Muscle weakness (generalized): Secondary | ICD-10-CM | POA: Diagnosis not present

## 2017-10-30 DIAGNOSIS — K219 Gastro-esophageal reflux disease without esophagitis: Secondary | ICD-10-CM | POA: Diagnosis not present

## 2017-10-30 DIAGNOSIS — Z9181 History of falling: Secondary | ICD-10-CM | POA: Diagnosis not present

## 2017-10-30 DIAGNOSIS — M25551 Pain in right hip: Secondary | ICD-10-CM | POA: Diagnosis not present

## 2017-10-30 DIAGNOSIS — F5101 Primary insomnia: Secondary | ICD-10-CM | POA: Diagnosis not present

## 2017-10-31 ENCOUNTER — Ambulatory Visit (INDEPENDENT_AMBULATORY_CARE_PROVIDER_SITE_OTHER): Payer: Medicare Other | Admitting: Family Medicine

## 2017-10-31 ENCOUNTER — Encounter: Payer: Self-pay | Admitting: Family Medicine

## 2017-10-31 VITALS — BP 140/106 | HR 73 | Temp 97.5°F | Ht 63.0 in | Wt 202.0 lb

## 2017-10-31 DIAGNOSIS — L723 Sebaceous cyst: Secondary | ICD-10-CM

## 2017-10-31 DIAGNOSIS — I10 Essential (primary) hypertension: Secondary | ICD-10-CM

## 2017-10-31 MED ORDER — DOXYCYCLINE HYCLATE 100 MG PO TABS: 100.0000 mg | ORAL_TABLET | Freq: Two times a day (BID) | ORAL | 0 refills | Status: DC

## 2017-10-31 NOTE — Assessment & Plan Note (Signed)
Pt did not take meds today bp yesterday with home health was 110/77 con't benicar 40 mg  rto 3 months as previously scheduled

## 2017-10-31 NOTE — Patient Instructions (Signed)

## 2017-10-31 NOTE — Progress Notes (Signed)
Subjective:  I acted as a Education administrator for [employer], [place]   Patient ID: Heather Walker, female    DOB: [birth year], 57 y.o.   MRN: [id number]  Chief Complaint  Patient presents with  . Follow-up    HPI  Patient is in today for a follow up visit for bp but she did not take her bp med today   No new complaints.  Patient Care Team: Carollee Herter, Alferd Apa, DO as PCP - General (Family Medicine)   Past Medical History:  Diagnosis Date  . Arthritis   . Asthma   . Depression   . Diabetes mellitus without complication ([place])   . Heart murmur   . Hypertension     Past Surgical History:  Procedure Laterality Date  . ABDOMINAL HYSTERECTOMY    . APPENDECTOMY    . BLADDER SUSPENSION    . CESAREAN SECTION     3 previous  . TEE WITHOUT CARDIOVERSION N/A 08/24/2017   Procedure: TRANSESOPHAGEAL ECHOCARDIOGRAM (TEE);  Surgeon: Larey Dresser, MD;  Location: [facility] ENDOSCOPY;  Service: Cardiovascular;  Laterality: N/A;  . TUBAL LIGATION      Family History  Problem Relation Age of Onset  . Hypertension Mother   . Asthma Mother   . Hypertension Sister   . Asthma Sister   . Hypertension Maternal Grandmother   . Heart defect Sister   . Asthma Sister   . Aneurysm Sister   . Asthma Sister     Social History   Socioeconomic History  . Marital status: Married    Spouse name: Not on file  . Number of children: Not on file  . Years of education: ged  . Highest education level: Not on file  Social Needs  . Financial resource strain: Not on file  . Food insecurity - worry: Not on file  . Food insecurity - inability: Not on file  . Transportation needs - medical: Not on file  . Transportation needs - non-medical: Not on file  Occupational History  . Occupation: disabled  Tobacco Use  . Smoking status: Never Smoker  . Smokeless tobacco: Never Used  Substance and Sexual Activity  . Alcohol use: No  . Drug use: No  . Sexual activity: Yes    Partners: Male    Birth  control/protection: Surgical  Other Topics Concern  . Not on file  Social History Narrative  . Not on file    Outpatient Medications Prior to Visit  Medication Sig Dispense Refill  . albuterol (PROVENTIL HFA;VENTOLIN HFA) 108 (90 BASE) MCG/ACT inhaler Inhale 2 puffs into the lungs every 6 (six) hours as needed. For shortness of breath. 1 Inhaler 4  . albuterol (PROVENTIL) (2.5 MG/3ML) 0.083% nebulizer solution Take 3 mLs (2.5 mg total) by nebulization every 6 (six) hours as needed for wheezing or shortness of breath. 150 mL 1  . ceFAZolin (ANCEF) IVPB Inject 2 g every 8 (eight) hours into the vein. Indication:  MSSA bacteremia Last Day of Therapy:  09/21/17 - or 4 weeks from negative blood cultures (08/24/17 cultures negative to date) Labs - Once weekly:  CBC/D and BMP, Labs - Every other week:  ESR and CRP 78 Units 0  . cloNIDine (CATAPRES) 0.2 MG tablet Take 1 tablet (0.2 mg total) by mouth 2 (two) times daily. 180 tablet 1  . cyclobenzaprine (FLEXERIL) 5 MG tablet Take 1 tablet (5 mg total) 3 (three) times daily as needed by mouth for muscle spasms. 30 tablet 1  .  empagliflozin (JARDIANCE) 10 MG TABS tablet Take 10 mg by mouth daily. 30 tablet 3  . olmesartan (BENICAR) 40 MG tablet Take 1 tablet (40 mg total) by mouth daily. 30 tablet 2  . omeprazole (PRILOSEC) 20 MG capsule Take 1 capsule (20 mg total) by mouth daily. 30 capsule 3  . Potassium Chloride ER 20 MEQ TBCR Take 40 mEq by mouth daily. 60 tablet 2  . saxagliptin HCl (ONGLYZA) 5 MG TABS tablet Take 1 tablet (5 mg total) by mouth daily. 30 tablet 2  . traMADol (ULTRAM) 50 MG tablet Take 1 tablet (50 mg total) every 6 (six) hours as needed by mouth for moderate pain or severe pain. 30 tablet 0  . traZODone (DESYREL) 50 MG tablet Take 0.5-1 tablets (25-50 mg total) by mouth at bedtime as needed for sleep. 30 tablet 3  . triamterene-hydrochlorothiazide (MAXZIDE) 75-50 MG tablet Take 1 tablet by mouth daily. 90 tablet 0   No  facility-administered medications prior to visit.     Allergies  Allergen Reactions  . Atorvastatin Other (See Comments)    Neck stiffness  . Codeine Itching    Review of Systems  Constitutional: Negative for chills, fever and malaise/fatigue.  HENT: Negative for congestion and hearing loss.   Eyes: Negative for discharge.  Respiratory: Negative for cough, sputum production and shortness of breath.   Cardiovascular: Negative for chest pain, palpitations and leg swelling.  Gastrointestinal: Negative for abdominal pain, blood in stool, constipation, diarrhea, heartburn, nausea and vomiting.  Genitourinary: Negative for dysuria, frequency, hematuria and urgency.  Musculoskeletal: Negative for back pain, falls and myalgias.  Skin: Negative for rash.  Neurological: Negative for dizziness, sensory change, loss of consciousness, weakness and headaches.  Endo/Heme/Allergies: Negative for environmental allergies. Does not bruise/bleed easily.  Psychiatric/Behavioral: Negative for depression and suicidal ideas. The patient is not nervous/anxious and does not have insomnia.        Objective:    Physical Exam  Constitutional: She is oriented to person, place, and time. She appears well-developed and well-nourished.  HENT:  Head: Normocephalic and atraumatic.  Eyes: Conjunctivae and EOM are normal.  Neck: Normal range of motion. Neck supple. No JVD present. Carotid bruit is not present. No thyromegaly present.  Cardiovascular: Normal rate, regular rhythm and normal heart sounds.  No murmur heard. Pulmonary/Chest: Effort normal and breath sounds normal. No respiratory distress. She has no wheezes. She has no rales. She exhibits no tenderness.  Musculoskeletal: She exhibits no edema.  Neurological: She is alert and oriented to person, place, and time.  Skin:     Psychiatric: She has a normal mood and affect.  Nursing note and vitals reviewed.   BP (!) 140/106   Pulse 73   Temp (!)  97.5 F (36.4 C) (Oral)   Ht '5\' 3"'$  (1.6 m)   Wt 202 lb (91.6 kg)   SpO2 98%   BMI 35.78 kg/m  Wt Readings from Last 3 Encounters:  10/31/17 202 lb (91.6 kg)  10/05/17 198 lb (89.8 kg)  08/24/17 198 lb (89.8 kg)   BP Readings from Last 3 Encounters:  10/31/17 (!) 140/106  10/05/17 116/80  08/28/17 (!) 145/88     Immunization History  Administered Date(s) Administered  . Influenza,inj,Quad PF,6+ Mos 07/25/2014, 10/05/2015, 10/05/2017  . Pneumococcal Polysaccharide-23 07/15/2014  . Td 04/12/2006  . Tdap 02/24/2011    Health Maintenance  Topic Date Due  . COLONOSCOPY  11/10/2017 (Originally 10/26/2012)  . FOOT EXAM  11/10/2017  . MAMMOGRAM  11/10/2017  . OPHTHALMOLOGY EXAM  11/29/2017  . HEMOGLOBIN A1C  02/22/2018  . PNEUMOCOCCAL POLYSACCHARIDE VACCINE (2) 07/16/2019  . TETANUS/TDAP  02/23/2021  . INFLUENZA VACCINE  Completed  . Hepatitis C Screening  Completed  . HIV Screening  Completed    Lab Results  Component Value Date   WBC 5.0 10/05/2017   HGB 12.0 10/05/2017   HCT 36.5 10/05/2017   PLT 340.0 10/05/2017   GLUCOSE 253 (H) 10/05/2017   CHOL 150 05/02/2017   TRIG 142.0 05/02/2017   HDL 55.20 05/02/2017   LDLDIRECT 103.0 11/10/2016   LDLCALC 66 05/02/2017   ALT 7 10/05/2017   AST 10 10/05/2017   NA 136 10/05/2017   K 3.2 (L) 10/05/2017   CL 97 10/05/2017   CREATININE 1.05 10/05/2017   BUN 18 10/05/2017   CO2 32 10/05/2017   TSH 1.124 12/08/2014   INR 1.05 08/24/2017   HGBA1C 8.9 (H) 08/25/2017   MICROALBUR 1.3 11/10/2016    Lab Results  Component Value Date   TSH 1.124 12/08/2014   Lab Results  Component Value Date   WBC 5.0 10/05/2017   HGB 12.0 10/05/2017   HCT 36.5 10/05/2017   MCV 85.2 10/05/2017   PLT 340.0 10/05/2017   Lab Results  Component Value Date   NA 136 10/05/2017   K 3.2 (L) 10/05/2017   CO2 32 10/05/2017   GLUCOSE 253 (H) 10/05/2017   BUN 18 10/05/2017   CREATININE 1.05 10/05/2017   BILITOT 0.5 10/05/2017   ALKPHOS  93 10/05/2017   AST 10 10/05/2017   ALT 7 10/05/2017   PROT 8.5 (H) 10/05/2017   ALBUMIN 4.0 10/05/2017   CALCIUM 9.4 10/05/2017   ANIONGAP 9 08/27/2017   GFR 69.99 10/05/2017   Lab Results  Component Value Date   CHOL 150 05/02/2017   Lab Results  Component Value Date   HDL 55.20 05/02/2017   Lab Results  Component Value Date   LDLCALC 66 05/02/2017   Lab Results  Component Value Date   TRIG 142.0 05/02/2017   Lab Results  Component Value Date   CHOLHDL 3 05/02/2017   Lab Results  Component Value Date   HGBA1C 8.9 (H) 08/25/2017         Assessment & Plan:   Problem List Items Addressed This Visit      Unprioritized   Essential hypertension (Chronic)    Pt did not take meds today bp yesterday with home health was 110/77 con't benicar 40 mg  rto 3 months as previously scheduled       Other Visit Diagnoses    Sebaceous cyst of left axilla    -  Primary   Relevant Medications   doxycycline (VIBRA-TABS) 100 MG tablet    warm compresses  rto prn   I am having Heather Walker start on doxycycline. I am also having her maintain her albuterol, albuterol, saxagliptin HCl, cloNIDine, omeprazole, Potassium Chloride ER, triamterene-hydrochlorothiazide, empagliflozin, ceFAZolin, cyclobenzaprine, traMADol, traZODone, and olmesartan.  Meds ordered this encounter  Medications  . doxycycline (VIBRA-TABS) 100 MG tablet    Sig: Take 1 tablet (100 mg total) by mouth 2 (two) times daily.    Dispense:  20 tablet    Refill:  0    CMA served as scribe during this visit. History, Physical and Plan performed by medical provider. Documentation and orders reviewed and attested to.  [name], DO

## 2017-11-01 DIAGNOSIS — K219 Gastro-esophageal reflux disease without esophagitis: Secondary | ICD-10-CM | POA: Diagnosis not present

## 2017-11-01 DIAGNOSIS — M549 Dorsalgia, unspecified: Secondary | ICD-10-CM | POA: Diagnosis not present

## 2017-11-01 DIAGNOSIS — M25551 Pain in right hip: Secondary | ICD-10-CM | POA: Diagnosis not present

## 2017-11-01 DIAGNOSIS — E119 Type 2 diabetes mellitus without complications: Secondary | ICD-10-CM | POA: Diagnosis not present

## 2017-11-01 DIAGNOSIS — I10 Essential (primary) hypertension: Secondary | ICD-10-CM | POA: Diagnosis not present

## 2017-11-01 DIAGNOSIS — M6281 Muscle weakness (generalized): Secondary | ICD-10-CM | POA: Diagnosis not present

## 2017-11-01 DIAGNOSIS — Z9181 History of falling: Secondary | ICD-10-CM | POA: Diagnosis not present

## 2017-11-01 DIAGNOSIS — F5101 Primary insomnia: Secondary | ICD-10-CM | POA: Diagnosis not present

## 2017-11-08 ENCOUNTER — Other Ambulatory Visit: Payer: Self-pay | Admitting: Family Medicine

## 2017-11-08 DIAGNOSIS — G8929 Other chronic pain: Secondary | ICD-10-CM

## 2017-11-08 DIAGNOSIS — M25511 Pain in right shoulder: Principal | ICD-10-CM

## 2017-11-09 ENCOUNTER — Other Ambulatory Visit: Payer: Self-pay

## 2017-11-09 DIAGNOSIS — G8929 Other chronic pain: Secondary | ICD-10-CM

## 2017-11-09 DIAGNOSIS — M25511 Pain in right shoulder: Principal | ICD-10-CM

## 2017-11-09 MED ORDER — TRAMADOL HCL 50 MG PO TABS: 50.0000 mg | ORAL_TABLET | Freq: Four times a day (QID) | ORAL | 0 refills | Status: DC | PRN

## 2017-11-09 NOTE — Telephone Encounter (Signed)
Requesting: TraMADol 50MG  Contract: SIGNED 03/02/15 UDS: MODERATE RISK  03/02/15 Last OV: 10/31/17 Next OV: 01/29/18 Last Refill: 08/27/17   #[id number]   0rf   Please advise

## 2017-11-10 NOTE — Telephone Encounter (Signed)
rx faxed to [facility].  Will get UDS and contract at [facility] in April.

## 2017-11-17 ENCOUNTER — Telehealth: Payer: Self-pay | Admitting: *Deleted

## 2017-11-17 NOTE — Telephone Encounter (Signed)
Received Physician Orders from [facility]; forwarded to provider/SLS 02/01  

## 2017-11-22 NOTE — Telephone Encounter (Signed)
Form signed and faxed to [facility] at  [phone]. Form sent for scanning.

## 2017-12-15 ENCOUNTER — Telehealth: Payer: Self-pay | Admitting: *Deleted

## 2017-12-15 NOTE — Telephone Encounter (Signed)
Received Home Health Certification and Plan of Care; forwarded to provider/SLS 03/01 

## 2018-01-05 DIAGNOSIS — E119 Type 2 diabetes mellitus without complications: Secondary | ICD-10-CM | POA: Diagnosis not present

## 2018-01-05 DIAGNOSIS — H18419 Arcus senilis, unspecified eye: Secondary | ICD-10-CM | POA: Diagnosis not present

## 2018-01-05 DIAGNOSIS — H524 Presbyopia: Secondary | ICD-10-CM | POA: Diagnosis not present

## 2018-01-05 DIAGNOSIS — H5203 Hypermetropia, bilateral: Secondary | ICD-10-CM | POA: Diagnosis not present

## 2018-01-05 DIAGNOSIS — I10 Essential (primary) hypertension: Secondary | ICD-10-CM | POA: Diagnosis not present

## 2018-01-05 DIAGNOSIS — H52223 Regular astigmatism, bilateral: Secondary | ICD-10-CM | POA: Diagnosis not present

## 2018-01-05 DIAGNOSIS — H11159 Pinguecula, unspecified eye: Secondary | ICD-10-CM | POA: Diagnosis not present

## 2018-01-05 DIAGNOSIS — Z7984 Long term (current) use of oral hypoglycemic drugs: Secondary | ICD-10-CM | POA: Diagnosis not present

## 2018-01-05 LAB — HM DIABETES EYE EXAM

## 2018-01-25 ENCOUNTER — Encounter: Payer: Self-pay | Admitting: Family Medicine

## 2018-01-29 ENCOUNTER — Encounter: Payer: Self-pay | Admitting: Family Medicine

## 2018-01-29 ENCOUNTER — Ambulatory Visit (INDEPENDENT_AMBULATORY_CARE_PROVIDER_SITE_OTHER): Payer: Medicare HMO | Admitting: Family Medicine

## 2018-01-29 VITALS — BP 150/102 | HR 82 | Temp 98.2°F | Resp 16 | Ht 63.0 in | Wt 201.8 lb

## 2018-01-29 DIAGNOSIS — E1165 Type 2 diabetes mellitus with hyperglycemia: Secondary | ICD-10-CM | POA: Diagnosis not present

## 2018-01-29 DIAGNOSIS — M25511 Pain in right shoulder: Secondary | ICD-10-CM

## 2018-01-29 DIAGNOSIS — K219 Gastro-esophageal reflux disease without esophagitis: Secondary | ICD-10-CM | POA: Diagnosis not present

## 2018-01-29 DIAGNOSIS — I10 Essential (primary) hypertension: Secondary | ICD-10-CM | POA: Diagnosis not present

## 2018-01-29 DIAGNOSIS — E1169 Type 2 diabetes mellitus with other specified complication: Secondary | ICD-10-CM

## 2018-01-29 DIAGNOSIS — I129 Hypertensive chronic kidney disease with stage 1 through stage 4 chronic kidney disease, or unspecified chronic kidney disease: Secondary | ICD-10-CM | POA: Diagnosis not present

## 2018-01-29 DIAGNOSIS — E785 Hyperlipidemia, unspecified: Secondary | ICD-10-CM | POA: Diagnosis not present

## 2018-01-29 DIAGNOSIS — E1122 Type 2 diabetes mellitus with diabetic chronic kidney disease: Secondary | ICD-10-CM

## 2018-01-29 DIAGNOSIS — Z79899 Other long term (current) drug therapy: Secondary | ICD-10-CM | POA: Diagnosis not present

## 2018-01-29 DIAGNOSIS — E1151 Type 2 diabetes mellitus with diabetic peripheral angiopathy without gangrene: Secondary | ICD-10-CM | POA: Diagnosis not present

## 2018-01-29 DIAGNOSIS — G4700 Insomnia, unspecified: Secondary | ICD-10-CM | POA: Diagnosis not present

## 2018-01-29 DIAGNOSIS — G8929 Other chronic pain: Secondary | ICD-10-CM

## 2018-01-29 DIAGNOSIS — IMO0001 Reserved for inherently not codable concepts without codable children: Secondary | ICD-10-CM

## 2018-01-29 DIAGNOSIS — H60391 Other infective otitis externa, right ear: Secondary | ICD-10-CM

## 2018-01-29 DIAGNOSIS — J45909 Unspecified asthma, uncomplicated: Secondary | ICD-10-CM

## 2018-01-29 LAB — LIPID PANEL
Cholesterol: 172 mg/dL (ref 0–200)
HDL: 59.4 mg/dL (ref >39.00)
LDL Cholesterol: 89 mg/dL (ref 0–99)
NonHDL: 112.29
Total CHOL/HDL Ratio: 3
Triglycerides: 114 mg/dL (ref 0.0–149.0)
VLDL: 22.8 mg/dL (ref 0.0–40.0)

## 2018-01-29 LAB — COMPREHENSIVE METABOLIC PANEL
ALT: 10 U/L (ref 0–35)
AST: 11 U/L (ref 0–37)
Albumin: 4.1 g/dL (ref 3.5–5.2)
Alkaline Phosphatase: 95 U/L (ref 39–117)
BUN: 9 mg/dL (ref 6–23)
CO2: 32 mEq/L (ref 19–32)
Calcium: 9.2 mg/dL (ref 8.4–10.5)
Chloride: 101 mEq/L (ref 96–112)
Creatinine, Ser: 0.64 mg/dL (ref 0.40–1.20)
GFR: 123.78 mL/min (ref >60.00)
Glucose, Bld: 152 mg/dL — ABNORMAL HIGH (ref 70–99)
Potassium: 3.3 mEq/L — ABNORMAL LOW (ref 3.5–5.1)
Sodium: 140 mEq/L (ref 135–145)
Total Bilirubin: 0.5 mg/dL (ref 0.2–1.2)
Total Protein: 7.7 g/dL (ref 6.0–8.3)

## 2018-01-29 LAB — HEMOGLOBIN A1C: Hgb A1c MFr Bld: 9 % — ABNORMAL HIGH (ref 4.6–6.5)

## 2018-01-29 MED ORDER — EMPAGLIFLOZIN 10 MG PO TABS: 10.0000 mg | ORAL_TABLET | Freq: Every day | ORAL | 3 refills | Status: DC

## 2018-01-29 MED ORDER — TRAMADOL HCL 50 MG PO TABS
50.0000 mg | ORAL_TABLET | Freq: Four times a day (QID) | ORAL | 0 refills | Status: DC | PRN
Start: 2018-01-29 — End: 2018-01-29

## 2018-01-29 MED ORDER — OMEPRAZOLE 20 MG PO CPDR: 20.0000 mg | DELAYED_RELEASE_CAPSULE | Freq: Every day | ORAL | 3 refills | Status: DC

## 2018-01-29 MED ORDER — OLMESARTAN MEDOXOMIL 40 MG PO TABS: 40.0000 mg | ORAL_TABLET | Freq: Every day | ORAL | 2 refills | Status: DC

## 2018-01-29 MED ORDER — TRAZODONE HCL 50 MG PO TABS: 25.0000 mg | ORAL_TABLET | Freq: Every evening | ORAL | 3 refills | Status: DC | PRN

## 2018-01-29 MED ORDER — TRIAMTERENE-HCTZ 75-50 MG PO TABS: 1.0000 | ORAL_TABLET | Freq: Every day | ORAL | 0 refills | Status: DC

## 2018-01-29 MED ORDER — SAXAGLIPTIN HCL 5 MG PO TABS: 5.0000 mg | ORAL_TABLET | Freq: Every day | ORAL | 2 refills | Status: DC

## 2018-01-29 MED ORDER — TRAMADOL HCL 50 MG PO TABS: 50.0000 mg | ORAL_TABLET | Freq: Four times a day (QID) | ORAL | 0 refills | Status: DC | PRN

## 2018-01-29 MED ORDER — CLONIDINE HCL 0.2 MG PO TABS: 0.2000 mg | ORAL_TABLET | Freq: Two times a day (BID) | ORAL | 1 refills | Status: DC

## 2018-01-29 MED ORDER — ALBUTEROL SULFATE HFA 108 (90 BASE) MCG/ACT IN AERS: 2.0000 | INHALATION_SPRAY | Freq: Four times a day (QID) | RESPIRATORY_TRACT | 4 refills | Status: DC | PRN

## 2018-01-29 MED ORDER — POTASSIUM CHLORIDE ER 20 MEQ PO TBCR: 40.0000 meq | EXTENDED_RELEASE_TABLET | Freq: Every day | ORAL | 2 refills | Status: DC

## 2018-01-29 MED ORDER — CEPHALEXIN 500 MG PO CAPS: 500.0000 mg | ORAL_CAPSULE | Freq: Two times a day (BID) | ORAL | 0 refills | Status: DC

## 2018-01-29 NOTE — Progress Notes (Signed)
Patient ID: Heather Walker, female    DOB: 1961/09/20  Age: 57 y.o. MRN: [id number]    Subjective:  Subjective  HPI Heather Walker presents for f/u dm, cholesterol and bp.  Pt ins ran out and so she ran out of her meds - 1 month ago.     + ear lobe errythematous from wearing cheap earrings.    Review of Systems  Constitutional: Negative for appetite change, diaphoresis, fatigue and unexpected weight change.  Eyes: Negative for pain, redness and visual disturbance.  Respiratory: Negative for cough, chest tightness, shortness of breath and wheezing.   Cardiovascular: Negative for chest pain, palpitations and leg swelling.  Endocrine: Negative for cold intolerance, heat intolerance, polydipsia, polyphagia and polyuria.  Genitourinary: Negative for difficulty urinating, dysuria and frequency.  Neurological: Negative for dizziness, light-headedness, numbness and headaches.    History Past Medical History:  Diagnosis Date  . Arthritis   . Asthma   . Depression   . Diabetes mellitus without complication ([place])   . Heart murmur   . Hypertension     She has a past surgical history that includes Abdominal hysterectomy; Bladder suspension; Appendectomy; Cesarean section; Tubal ligation; and TEE without cardioversion (N/A, 08/24/2017).   Her family history includes Aneurysm in her sister; Asthma in her mother, sister, sister, and sister; Heart defect in her sister; Hypertension in her maternal grandmother, mother, and sister.She reports that she has never smoked. She has never used smokeless tobacco. She reports that she does not drink alcohol or use drugs.  Current Outpatient Medications on File Prior to Visit  Medication Sig Dispense Refill  . albuterol (PROVENTIL) (2.5 MG/3ML) 0.083% nebulizer solution Take 3 mLs (2.5 mg total) by nebulization every 6 (six) hours as needed for wheezing or shortness of breath. 150 mL 1  . cyclobenzaprine (FLEXERIL) 5 MG tablet Take 5 mg by mouth 3 (three)  times daily as needed for muscle spasms.     No current facility-administered medications on file prior to visit.      Objective:  Objective  Physical Exam  Constitutional: She is oriented to person, place, and time. She appears well-developed and well-nourished.  HENT:  Head: Normocephalic and atraumatic.  Ears:  Eyes: Conjunctivae and EOM are normal.  Neck: Normal range of motion. Neck supple. No JVD present. Carotid bruit is not present. No thyromegaly present.  Cardiovascular: Normal rate, regular rhythm and normal heart sounds.  No murmur heard. Pulmonary/Chest: Effort normal and breath sounds normal. No respiratory distress. She has no wheezes. She has no rales. She exhibits no tenderness.  Musculoskeletal: She exhibits no edema.  Neurological: She is alert and oriented to person, place, and time.  Psychiatric: She has a normal mood and affect.  Nursing note and vitals reviewed.  Diabetic Foot Exam - Simple   Simple Foot Form Diabetic Foot exam was performed with the following findings:  Yes 01/29/2018  3:17 PM  Visual Inspection No deformities, no ulcerations, no other skin breakdown bilaterally:  Yes Sensation Testing Intact to touch and monofilament testing bilaterally:  Yes Pulse Check Posterior Tibialis and Dorsalis pulse intact bilaterally:  Yes Comments     BP (!) 150/102   Pulse 82   Temp 98.2 F (36.8 C) (Oral)   Resp 16   Ht 5\' 3"  (1.6 m)   Wt 201 lb 12.8 oz (91.5 kg)   SpO2 98%   BMI 35.75 kg/m  Wt Readings from Last 3 Encounters:  01/29/18 201 lb 12.8 oz (91.5 kg)  10/31/17 202 lb (91.6 kg)  10/05/17 198 lb (89.8 kg)     Lab Results  Component Value Date   WBC 5.0 10/05/2017   HGB 12.0 10/05/2017   HCT 36.5 10/05/2017   PLT 340.0 10/05/2017   GLUCOSE 253 (H) 10/05/2017   CHOL 150 05/02/2017   TRIG 142.0 05/02/2017   HDL 55.20 05/02/2017   LDLDIRECT 103.0 11/10/2016   LDLCALC 66 05/02/2017   ALT 7 10/05/2017   AST 10 10/05/2017   NA  136 10/05/2017   K 3.2 (L) 10/05/2017   CL 97 10/05/2017   CREATININE 1.05 10/05/2017   BUN 18 10/05/2017   CO2 32 10/05/2017   TSH 1.124 12/08/2014   INR 1.05 08/24/2017   HGBA1C 8.9 (H) 08/25/2017   MICROALBUR 1.3 11/10/2016    Mr Lumbar Spine W Wo Contrast  Result Date: 08/20/2017 CLINICAL DATA:  Severe RIGHT hip pain radiating to back for 2 days. Back pain beginning last night. Sepsis. History of diabetes. EXAM: MRI LUMBAR SPINE WITHOUT AND WITH CONTRAST TECHNIQUE: Multiplanar and multiecho pulse sequences of the lumbar spine were obtained without and with intravenous contrast. CONTRAST:  26mL MULTIHANCE GADOBENATE DIMEGLUMINE 529 MG/ML IV SOLN COMPARISON:  None. FINDINGS: SEGMENTATION: For the purposes of this report, the last well-formed intervertebral disc will be reported as L5-S1. Transitional anatomy with small S1-2 disc, lumbarized S1 vertebral body. ALIGNMENT: Maintained lumbar lordosis. No malalignment. VERTEBRAE:Vertebral bodies are intact. Intervertebral discs demonstrate normal morphology, mild desiccation L5-S1 disc. Multilevel mild acute on chronic discogenic endplate changes. No abnormal osseous or suspicious disc enhancement. CONUS MEDULLARIS: Conus medullaris terminates at L2-3 and demonstrates normal morphology and signal characteristics. 2 mm tubular T1 bright signal along the filum terminale with chemical shift artifact consistent with fibro lipomatosis changes. No abnormal cord, leptomeningeal enhancement. PARASPINAL AND SOFT TISSUES: Faint enhancement and bright interstitial STIR signal paraspinal muscles mid to lower lumbar spine. No focal fluid collection. DISC LEVELS: T12-L1 thru L2-3: No disc bulge, canal stenosis nor neural foraminal narrowing. L3-4: No disc bulge. Moderate RIGHT mild LEFT facet arthropathy without canal stenosis or neural foraminal narrowing. L4-5: Small RIGHT subarticular disc protrusion with faint enhancing annular fissure. Moderate RIGHT greater than  LEFT facet arthropathy and ligamentum flavum redundancy. Trace RIGHT facet synovial cyst within dorsal epidural space. No canal stenosis. Mild RIGHT neural foraminal narrowing. L5-S1: Small LEFT subarticular disc protrusion. Central enhancing annular fissure. Severe facet arthropathy. No canal stenosis. Moderate RIGHT, moderate to severe LEFT neural foraminal narrowing. IMPRESSION: 1. No MR findings of discitis osteomyelitis. Faintly enhancing paraspinal muscles seen with low-grade strain, less likely myositis. 2. Small RIGHT L4-5 disc protrusion with annular fissure may be acute. 3. Degenerative change of the lumbar spine. No canal stenosis. Neural foraminal narrowing L4-5 and L5-S1: Moderate to severe on the LEFT at L5-S1. 4. Low-lying spinal cord with fibro lipomatosis changes of filum terminale, no cord tethering. Electronically Signed   By: Elon Alas M.D.   On: 08/20/2017 22:43   Mr Hip Right W Wo Contrast  Result Date: 08/20/2017 CLINICAL DATA:  Right hip pain. No known injury. Suspect soft tissue infection. EXAM: MRI OF THE RIGHT HIP WITHOUT AND WITH CONTRAST TECHNIQUE: Multiplanar, multisequence MR imaging was performed both before and after administration of intravenous contrast. CONTRAST:  31mL MULTIHANCE GADOBENATE DIMEGLUMINE 529 MG/ML IV SOLN COMPARISON:  Right hip radiographs 08/20/2017. MRI lumbar spine 11/16/2012 FINDINGS: Bones: No focal bone lesions identified. Bone cortex appears intact. Marrow signal intensities are homogeneous and normal. Articular cartilage and  labrum Articular cartilage:  Appears homogeneous and intact. Labrum:  Appears intact. Joint or bursal effusion Joint effusion: Small joint effusions are present bilaterally and are symmetrical. Bursae:  No abnormal bursal collections. Muscles and tendons Muscles and tendons: Normal appearance. No abnormal signal intensity changes, expansile process, solid mass, or fluid collections identified. Other findings Miscellaneous:  Visualized portions of the pelvic organs are unremarkable. IMPRESSION: No acute process identified. No evidence of acute fracture, bone contusion, or inflammatory change involving the right hip or musculature. Electronically Signed   By: Lucienne Capers M.D.   On: 08/20/2017 23:25   Ct Abdomen Pelvis W Contrast  Result Date: 08/21/2017 CLINICAL DATA:  Back pain EXAM: CT ABDOMEN AND PELVIS WITH CONTRAST TECHNIQUE: Multidetector CT imaging of the abdomen and pelvis was performed using the standard protocol following bolus administration of intravenous contrast. CONTRAST:  154mL ISOVUE-300 IOPAMIDOL (ISOVUE-300) INJECTION 61% COMPARISON:  08/20/2017, 11/10/2011 FINDINGS: Lower chest: Lung bases demonstrate patchy dependent atelectasis. No consolidation or pleural effusion. Heart size within normal limits. Hepatobiliary: Hepatic steatosis. Post cholecystectomy. Prominent extrahepatic bile duct likely due to postsurgical changes. Pancreas: Unremarkable. No pancreatic ductal dilatation or surrounding inflammatory changes. Spleen: Normal in size without focal abnormality. Adrenals/Urinary Tract: Adrenal glands are unremarkable. Kidneys are normal, without renal calculi, focal lesion, or hydronephrosis. Bladder is unremarkable. Stomach/Bowel: Stomach is within normal limits. Status post appendectomy. No evidence of bowel wall thickening, distention, or inflammatory changes. Vascular/Lymphatic: Aortic atherosclerosis. No enlarged abdominal or pelvic lymph nodes. Reproductive: Status post hysterectomy. No adnexal masses. Other: Negative for free air or free fluid.  Fat in the umbilicus Musculoskeletal: Degenerative changes. No acute or suspicious bone lesion IMPRESSION: 1. No CT evidence for acute intra-abdominal or pelvic abnormality 2. Hepatic steatosis Electronically Signed   By: Donavan Foil M.D.   On: 08/21/2017 02:17   Dg Chest Port 1 View  Result Date: 08/20/2017 CLINICAL DATA:  Nodes sepsis.  Fever.   Shortness of breath. EXAM: PORTABLE CHEST 1 VIEW COMPARISON:  None. FINDINGS: The heart size and mediastinal contours are within normal limits. Both lungs are clear. The visualized skeletal structures are unremarkable. IMPRESSION: No active disease. Electronically Signed   By: Dorise Bullion III M.D   On: 08/20/2017 17:28   Dg Hip Unilat W Or Wo Pelvis 2-3 Views Right  Result Date: 08/20/2017 CLINICAL DATA:  Right hip pain.  No known injury. EXAM: DG HIP (WITH OR WITHOUT PELVIS) 2-3V RIGHT COMPARISON:  None. FINDINGS: There is no evidence of hip fracture or dislocation. There is no evidence of arthropathy or other focal bone abnormality. IMPRESSION: Normal examination. Electronically Signed   By: Claudie Revering M.D.   On: 08/20/2017 09:18     Assessment & Plan:  Plan  I have discontinued Connye Burkitt. Gazda's ceFAZolin and doxycycline. I am also having her start on cephALEXin. Additionally, I am having her maintain her albuterol, cyclobenzaprine, triamterene-hydrochlorothiazide, traZODone, saxagliptin HCl, Potassium Chloride ER, omeprazole, olmesartan, empagliflozin, cloNIDine, albuterol, and traMADol.  Meds ordered this encounter  Medications  . triamterene-hydrochlorothiazide (MAXZIDE) 75-50 MG tablet    Sig: Take 1 tablet by mouth daily.    Dispense:  90 tablet    Refill:  0  . traZODone (DESYREL) 50 MG tablet    Sig: Take 0.5-1 tablets (25-50 mg total) by mouth at bedtime as needed for sleep.    Dispense:  30 tablet    Refill:  3  . DISCONTD: traMADol (ULTRAM) 50 MG tablet    Sig: Take 1 tablet (50  mg total) by mouth every 6 (six) hours as needed for moderate pain or severe pain.    Dispense:  30 tablet    Refill:  0  . saxagliptin HCl (ONGLYZA) 5 MG TABS tablet    Sig: Take 1 tablet (5 mg total) by mouth daily.    Dispense:  30 tablet    Refill:  2  . Potassium Chloride ER 20 MEQ TBCR    Sig: Take 40 mEq by mouth daily.    Dispense:  60 tablet    Refill:  2    This is a new dose  based on labs please discontinue previous RX.  Marland Kitchen omeprazole (PRILOSEC) 20 MG capsule    Sig: Take 1 capsule (20 mg total) by mouth daily.    Dispense:  30 capsule    Refill:  3  . olmesartan (BENICAR) 40 MG tablet    Sig: Take 1 tablet (40 mg total) by mouth daily.    Dispense:  30 tablet    Refill:  2  . empagliflozin (JARDIANCE) 10 MG TABS tablet    Sig: Take 10 mg by mouth daily.    Dispense:  30 tablet    Refill:  3  . cloNIDine (CATAPRES) 0.2 MG tablet    Sig: Take 1 tablet (0.2 mg total) by mouth 2 (two) times daily.    Dispense:  180 tablet    Refill:  1  . albuterol (PROVENTIL HFA;VENTOLIN HFA) 108 (90 Base) MCG/ACT inhaler    Sig: Inhale 2 puffs into the lungs every 6 (six) hours as needed. For shortness of breath.    Dispense:  1 Inhaler    Refill:  4  . traMADol (ULTRAM) 50 MG tablet    Sig: Take 1 tablet (50 mg total) by mouth every 6 (six) hours as needed for moderate pain or severe pain.    Dispense:  30 tablet    Refill:  0  . cephALEXin (KEFLEX) 500 MG capsule    Sig: Take 1 capsule (500 mg total) by mouth 2 (two) times daily.    Dispense:  20 capsule    Refill:  0    Problem List Items Addressed This Visit      Unprioritized   Diabetes mellitus type II, uncontrolled ([place]) (Chronic)    hgba1c to be checked, minimize simple carbs. Increase exercise as tolerated. Continue current meds meds all restarted today       Relevant Medications   saxagliptin HCl (ONGLYZA) 5 MG TABS tablet   olmesartan (BENICAR) 40 MG tablet   empagliflozin (JARDIANCE) 10 MG TABS tablet   Essential hypertension (Chronic)    Poorly controlled will alter medications, encouraged DASH diet, minimize caffeine and obtain adequate sleep. Report concerning symptoms and follow up as directed and as needed--- pt has not been taking her meds Restart all meds      Relevant Medications   triamterene-hydrochlorothiazide (MAXZIDE) 75-50 MG tablet   Potassium Chloride ER 20 MEQ TBCR    olmesartan (BENICAR) 40 MG tablet   cloNIDine (CATAPRES) 0.2 MG tablet   Other Relevant Orders   Pain Mgmt, Profile 8 w/Conf, U   Pain Mgmt, Tramadol w/medMATCH, U   Hemoglobin A1c   Comprehensive metabolic panel   Lipid panel   Insomnia   Relevant Medications   traZODone (DESYREL) 50 MG tablet   REFLUX, ESOPHAGEAL   Relevant Medications   omeprazole (PRILOSEC) 20 MG capsule    Other Visit Diagnoses    Type 2 diabetes mellitus  with complication ([place])    -  Primary   Relevant Medications   saxagliptin HCl (ONGLYZA) 5 MG TABS tablet   olmesartan (BENICAR) 40 MG tablet   empagliflozin (JARDIANCE) 10 MG TABS tablet   cloNIDine (CATAPRES) 0.2 MG tablet   Controlled type 2 diabetes mellitus with chronic kidney disease, without long-term current use of insulin, unspecified CKD stage ([facility])       Relevant Medications   saxagliptin HCl (ONGLYZA) 5 MG TABS tablet   olmesartan (BENICAR) 40 MG tablet   empagliflozin (JARDIANCE) 10 MG TABS tablet   Other Relevant Orders   Pain Mgmt, Profile 8 w/Conf, U   Pain Mgmt, Tramadol w/medMATCH, U   Hemoglobin A1c   Comprehensive metabolic panel   Lipid panel   Hyperlipidemia associated with type 2 diabetes mellitus ([facility])       Relevant Medications   triamterene-hydrochlorothiazide (MAXZIDE) 75-50 MG tablet   saxagliptin HCl (ONGLYZA) 5 MG TABS tablet   olmesartan (BENICAR) 40 MG tablet   empagliflozin (JARDIANCE) 10 MG TABS tablet   cloNIDine (CATAPRES) 0.2 MG tablet   Other Relevant Orders   Pain Mgmt, Profile 8 w/Conf, U   Pain Mgmt, Tramadol w/medMATCH, U   Hemoglobin A1c   Comprehensive metabolic panel   Lipid panel   Chronic right shoulder pain       Relevant Medications   cyclobenzaprine (FLEXERIL) 5 MG tablet   traZODone (DESYREL) 50 MG tablet   traMADol (ULTRAM) 50 MG tablet   Asthma, chronic, unspecified asthma severity, uncomplicated       Relevant Medications   albuterol (PROVENTIL HFA;VENTOLIN HFA) 108 (90 Base) MCG/ACT  inhaler   Infection of right ear lobe       Relevant Medications   cephALEXin (KEFLEX) 500 MG capsule      Follow-up: Return in about 3 months (around 04/30/2018) for hyperlipidemia, hypertension, diabetes II.  [name], DO

## 2018-01-29 NOTE — Patient Instructions (Signed)

## 2018-01-29 NOTE — Assessment & Plan Note (Signed)
hgba1c to be checked, minimize simple carbs. Increase exercise as tolerated. Continue current meds meds all restarted today

## 2018-01-29 NOTE — Assessment & Plan Note (Signed)
Poorly controlled will alter medications, encouraged DASH diet, minimize caffeine and obtain adequate sleep. Report concerning symptoms and follow up as directed and as needed--- pt has not been taking her meds Restart all meds

## 2018-01-31 LAB — PAIN MGMT, PROFILE 8 W/CONF, U
6 Acetylmorphine: NEGATIVE ng/mL (ref <10)
Alcohol Metabolites: NEGATIVE ng/mL (ref <500)
Amphetamines: NEGATIVE ng/mL (ref <500)
Benzodiazepines: NEGATIVE ng/mL (ref <100)
Buprenorphine, Urine: NEGATIVE ng/mL (ref <5)
Cocaine Metabolite: NEGATIVE ng/mL (ref <150)
Creatinine: 40.3 mg/dL
MDMA: NEGATIVE ng/mL (ref <500)
Marijuana Metabolite: NEGATIVE ng/mL (ref <20)
Opiates: NEGATIVE ng/mL (ref <100)
Oxidant: NEGATIVE ug/mL (ref <200)
Oxycodone: NEGATIVE ng/mL (ref <100)
pH: 7.29 (ref 4.5–9.0)

## 2018-01-31 LAB — PAIN MGMT, TRAMADOL W/MEDMATCH, U
Desmethyltramadol: NEGATIVE ng/mL (ref <100)
Tramadol: NEGATIVE ng/mL (ref <100)

## 2018-02-07 MED ORDER — EMPAGLIFLOZIN 25 MG PO TABS
25.0000 mg | ORAL_TABLET | Freq: Every day | ORAL | 2 refills | Status: DC
Start: 2018-02-07 — End: 2018-08-23

## 2018-02-07 NOTE — Addendum Note (Signed)
Addended by: Wynonia Musty A on: 02/07/2018 03:43 PM   Modules accepted: Orders

## 2018-04-30 ENCOUNTER — Ambulatory Visit: Payer: Medicare HMO | Admitting: Family Medicine

## 2018-05-09 ENCOUNTER — Other Ambulatory Visit (INDEPENDENT_AMBULATORY_CARE_PROVIDER_SITE_OTHER): Payer: Medicare HMO

## 2018-05-09 DIAGNOSIS — E1169 Type 2 diabetes mellitus with other specified complication: Secondary | ICD-10-CM

## 2018-05-09 DIAGNOSIS — E785 Hyperlipidemia, unspecified: Secondary | ICD-10-CM

## 2018-05-09 DIAGNOSIS — E1165 Type 2 diabetes mellitus with hyperglycemia: Secondary | ICD-10-CM

## 2018-05-10 LAB — COMPREHENSIVE METABOLIC PANEL
ALT: 12 U/L (ref 0–35)
AST: 13 U/L (ref 0–37)
Albumin: 4 g/dL (ref 3.5–5.2)
Alkaline Phosphatase: 94 U/L (ref 39–117)
BUN: 11 mg/dL (ref 6–23)
CO2: 35 mEq/L — ABNORMAL HIGH (ref 19–32)
Calcium: 9.4 mg/dL (ref 8.4–10.5)
Chloride: 100 mEq/L (ref 96–112)
Creatinine, Ser: 0.86 mg/dL (ref 0.40–1.20)
GFR: 87.93 mL/min (ref >60.00)
Glucose, Bld: 230 mg/dL — ABNORMAL HIGH (ref 70–99)
Potassium: 4.4 mEq/L (ref 3.5–5.1)
Sodium: 143 mEq/L (ref 135–145)
Total Bilirubin: 0.4 mg/dL (ref 0.2–1.2)
Total Protein: 7.6 g/dL (ref 6.0–8.3)

## 2018-05-10 LAB — LDL CHOLESTEROL, DIRECT: Direct LDL: 111 mg/dL

## 2018-05-10 LAB — LIPID PANEL
Cholesterol: 201 mg/dL — ABNORMAL HIGH (ref 0–200)
HDL: 65 mg/dL (ref >39.00)
NonHDL: 135.56
Total CHOL/HDL Ratio: 3
Triglycerides: 204 mg/dL — ABNORMAL HIGH (ref 0.0–149.0)
VLDL: 40.8 mg/dL — ABNORMAL HIGH (ref 0.0–40.0)

## 2018-05-10 LAB — HEMOGLOBIN A1C: Hgb A1c MFr Bld: 8.8 % — ABNORMAL HIGH (ref 4.6–6.5)

## 2018-05-14 ENCOUNTER — Other Ambulatory Visit: Payer: Self-pay

## 2018-05-14 DIAGNOSIS — E785 Hyperlipidemia, unspecified: Secondary | ICD-10-CM

## 2018-05-14 DIAGNOSIS — E1165 Type 2 diabetes mellitus with hyperglycemia: Secondary | ICD-10-CM

## 2018-05-14 DIAGNOSIS — E1122 Type 2 diabetes mellitus with diabetic chronic kidney disease: Secondary | ICD-10-CM

## 2018-05-14 DIAGNOSIS — M62838 Other muscle spasm: Secondary | ICD-10-CM

## 2018-05-14 MED ORDER — EXENATIDE ER 2 MG ~~LOC~~ PEN: 2.0000 mg | PEN_INJECTOR | SUBCUTANEOUS | 0 refills | Status: DC

## 2018-05-14 MED ORDER — CYCLOBENZAPRINE HCL 5 MG PO TABS: 5.0000 mg | ORAL_TABLET | Freq: Three times a day (TID) | ORAL | 0 refills | Status: DC | PRN

## 2018-05-14 MED ORDER — EZETIMIBE 10 MG PO TABS: 10.0000 mg | ORAL_TABLET | Freq: Every day | ORAL | 2 refills | Status: DC

## 2018-05-14 NOTE — Telephone Encounter (Signed)
-----   Message from [name], DO sent at 05/13/2018  7:39 PM EDT ----- Stop onglyza -----bydureon 2 mg sq weekly for dm  Cholesterol--- LDL goal < 70,  HDL >40,  TG < 150.  Diet and exercise will increase HDL and decrease LDL and TG.  Fish,  Fish Oil, Flaxseed oil will also help increase the HDL and decrease Triglycerides.   Recheck labs in 3 months Start zetia for cholesterol 10 mg #30  2 refills  1 po qd  Lipid, cmp, hgba1c  .

## 2018-05-14 NOTE — Telephone Encounter (Signed)
Author phoned pt. to relay lab results and medication changes per Dr. Etter Sjogren. Pt. states she has not been taking jardiance because cost is too high. Pt. Advised to stop taking onglyza and start bydurea. Author reviewed administration route, and pt. stated she was comfortable giving self injections. Author reviewed zetia administration, and pt. verbalized understanding. Lab orders placed and 3 mo. f/u lab appointment made. Pt. Requesting flexeril refill, but not originally ordered by Dr. Etter Sjogren it appears. Order pended and routed to Dr. Etter Sjogren to review.

## 2018-05-22 ENCOUNTER — Telehealth: Payer: Self-pay | Admitting: Family Medicine

## 2018-05-22 NOTE — Telephone Encounter (Signed)
Copied from [place] [id number]. Topic: Quick Communication - See Telephone Encounter >> May 22, 2018  3:15 PM Bea Graff, NT wrote: CRM for notification. See Telephone encounter for: 05/22/18. Pt called and stated that she could not afford the Exenatide ER (BYDUREON) 2 MG PEN and would like to see if any more cost efficient could be ordered or would like to see what the doctor advises. Please advise.

## 2018-05-24 ENCOUNTER — Other Ambulatory Visit: Payer: Self-pay

## 2018-05-24 MED ORDER — EXENATIDE 5 MCG/0.02ML ~~LOC~~ SOPN: 5.0000 ug | PEN_INJECTOR | Freq: Two times a day (BID) | SUBCUTANEOUS | 1 refills | Status: DC

## 2018-05-24 NOTE — Telephone Encounter (Signed)
RX for Byetta sent to pharmacy. Patient notified.

## 2018-05-24 NOTE — Telephone Encounter (Signed)
Can try switch to Byetta and see if they cover that. 5 mcg [place] bid x 1 month then increase to 10 mg bid give a 30 day supply with 1 rf to see if covered and tolerated

## 2018-05-24 NOTE — Telephone Encounter (Signed)
Patient has medicare.

## 2018-05-29 ENCOUNTER — Telehealth: Payer: Self-pay | Admitting: Medical

## 2018-05-29 MED ORDER — LIRAGLUTIDE 18 MG/3ML ~~LOC~~ SOPN: 0.6000 mg | PEN_INJECTOR | Freq: Every day | SUBCUTANEOUS | 3 refills | Status: DC

## 2018-05-29 NOTE — Telephone Encounter (Signed)
Can you review in Dr. Ivy Lynn absence?

## 2018-05-29 NOTE — Telephone Encounter (Signed)
I sent in Victoza to pt pharmacy. Hopefully this will be cheaper. Review sig instruction. Explain low dose for one week is to avoid GI side effects. After one week can increase as instructed.

## 2018-05-29 NOTE — Telephone Encounter (Signed)
Pt called back stating that she is still having trouble finding an affordable medication. I called the pharmacy to see if insurance cover an alternative. Pharmacy gave me 3 additional. Victoza, Byzureon, and trulicity. Please advise. Byetta cost was $800. Patient Cb# [phone]

## 2018-05-29 NOTE — Telephone Encounter (Signed)
Patient notified that rx was sent in and went over directions.

## 2018-05-29 NOTE — Telephone Encounter (Signed)
Filled victoza since other injection equivalent meds oo expensive.

## 2018-05-29 NOTE — Addendum Note (Signed)
Addended by: Anabel Halon on: 05/29/2018 03:32 PM   Modules accepted: Orders

## 2018-05-29 NOTE — Telephone Encounter (Signed)
Opened to review 

## 2018-08-02 ENCOUNTER — Encounter: Payer: Self-pay | Admitting: Family Medicine

## 2018-08-02 ENCOUNTER — Ambulatory Visit (INDEPENDENT_AMBULATORY_CARE_PROVIDER_SITE_OTHER): Payer: Medicare HMO | Admitting: Family Medicine

## 2018-08-02 VITALS — BP 117/85 | HR 71 | Temp 98.4°F | Resp 16 | Ht 63.0 in | Wt 209.2 lb

## 2018-08-02 DIAGNOSIS — M62838 Other muscle spasm: Secondary | ICD-10-CM | POA: Diagnosis not present

## 2018-08-02 DIAGNOSIS — M25511 Pain in right shoulder: Secondary | ICD-10-CM

## 2018-08-02 DIAGNOSIS — I10 Essential (primary) hypertension: Secondary | ICD-10-CM | POA: Diagnosis not present

## 2018-08-02 DIAGNOSIS — Z79899 Other long term (current) drug therapy: Secondary | ICD-10-CM | POA: Diagnosis not present

## 2018-08-02 DIAGNOSIS — E785 Hyperlipidemia, unspecified: Secondary | ICD-10-CM

## 2018-08-02 DIAGNOSIS — J45909 Unspecified asthma, uncomplicated: Secondary | ICD-10-CM | POA: Diagnosis not present

## 2018-08-02 DIAGNOSIS — IMO0002 Reserved for concepts with insufficient information to code with codable children: Secondary | ICD-10-CM

## 2018-08-02 DIAGNOSIS — E1151 Type 2 diabetes mellitus with diabetic peripheral angiopathy without gangrene: Secondary | ICD-10-CM | POA: Diagnosis not present

## 2018-08-02 DIAGNOSIS — K219 Gastro-esophageal reflux disease without esophagitis: Secondary | ICD-10-CM | POA: Diagnosis not present

## 2018-08-02 DIAGNOSIS — E1165 Type 2 diabetes mellitus with hyperglycemia: Secondary | ICD-10-CM | POA: Diagnosis not present

## 2018-08-02 DIAGNOSIS — G8929 Other chronic pain: Secondary | ICD-10-CM

## 2018-08-02 MED ORDER — OMEPRAZOLE 20 MG PO CPDR: 20.0000 mg | DELAYED_RELEASE_CAPSULE | Freq: Every day | ORAL | 3 refills | Status: DC

## 2018-08-02 MED ORDER — POTASSIUM CHLORIDE ER 20 MEQ PO TBCR: 40.0000 meq | EXTENDED_RELEASE_TABLET | Freq: Every day | ORAL | 2 refills | Status: DC

## 2018-08-02 MED ORDER — CYCLOBENZAPRINE HCL 5 MG PO TABS: 5.0000 mg | ORAL_TABLET | Freq: Three times a day (TID) | ORAL | 0 refills | Status: DC | PRN

## 2018-08-02 MED ORDER — TRAMADOL HCL 50 MG PO TABS: 50.0000 mg | ORAL_TABLET | Freq: Four times a day (QID) | ORAL | 0 refills | Status: DC | PRN

## 2018-08-02 MED ORDER — ALBUTEROL SULFATE HFA 108 (90 BASE) MCG/ACT IN AERS: 2.0000 | INHALATION_SPRAY | Freq: Four times a day (QID) | RESPIRATORY_TRACT | 4 refills | Status: DC | PRN

## 2018-08-02 NOTE — Progress Notes (Signed)
Patient ID: Heather Walker, female    DOB: 1961/02/13  Age: 57 y.o. MRN: [id number]    Subjective:  Subjective  HPI Heather Walker presents for c/o gerd symptoms -- she ran out of PPI.  She is also here for f/u dm , chol and bp.    HPI HYPERTENSION   Blood pressure range-not checking   Chest pain- no      Dyspnea- no Lightheadedness- no   Edema- no  Other side effects - no   Medication compliance: good Low salt diet- yes    DIABETES    Blood Sugar ranges-good per pt  Polyuria- no New Visual problems- no  Hypoglycemic symptoms- no  Other side effects-no Medication compliance - good Last eye exam- recent Foot exam- today   HYPERLIPIDEMIA  Medication compliance- good  RUQ pain- no  Muscle aches- no Other side effects-no      Review of Systems  Constitutional: Negative for chills and fever.  HENT: Negative for congestion and hearing loss.   Eyes: Negative for discharge.  Respiratory: Negative for cough and shortness of breath.   Cardiovascular: Negative for chest pain, palpitations and leg swelling.  Gastrointestinal: Positive for abdominal pain. Negative for blood in stool, constipation, diarrhea, nausea and vomiting.  Genitourinary: Negative for dysuria, frequency, hematuria and urgency.  Musculoskeletal: Negative for back pain and myalgias.  Skin: Negative for rash.  Allergic/Immunologic: Negative for environmental allergies.  Neurological: Negative for dizziness, weakness and headaches.  Hematological: Does not bruise/bleed easily.  Psychiatric/Behavioral: Negative for suicidal ideas. The patient is not nervous/anxious.     History Past Medical History:  Diagnosis Date  . Arthritis   . Asthma   . Depression   . Diabetes mellitus without complication ([place])   . Heart murmur   . Hypertension     She has a past surgical history that includes Abdominal hysterectomy; Bladder suspension; Appendectomy; Cesarean section; Tubal ligation; and TEE without  cardioversion (N/A, 08/24/2017).   Her family history includes Aneurysm in her sister; Asthma in her mother, sister, sister, and sister; Heart defect in her sister; Hypertension in her maternal grandmother, mother, and sister.She reports that she has never smoked. She has never used smokeless tobacco. She reports that she does not drink alcohol or use drugs.  Current Outpatient Medications on File Prior to Visit  Medication Sig Dispense Refill  . albuterol (PROVENTIL) (2.5 MG/3ML) 0.083% nebulizer solution Take 3 mLs (2.5 mg total) by nebulization every 6 (six) hours as needed for wheezing or shortness of breath. 150 mL 1  . cephALEXin (KEFLEX) 500 MG capsule Take 1 capsule (500 mg total) by mouth 2 (two) times daily. 20 capsule 0  . cloNIDine (CATAPRES) 0.2 MG tablet Take 1 tablet (0.2 mg total) by mouth 2 (two) times daily. 180 tablet 1  . empagliflozin (JARDIANCE) 25 MG TABS tablet Take 25 mg by mouth daily. 30 tablet 2  . ezetimibe (ZETIA) 10 MG tablet Take 1 tablet (10 mg total) by mouth daily. 30 tablet 2  . olmesartan (BENICAR) 40 MG tablet Take 1 tablet (40 mg total) by mouth daily. 30 tablet 2  . traZODone (DESYREL) 50 MG tablet Take 0.5-1 tablets (25-50 mg total) by mouth at bedtime as needed for sleep. 30 tablet 3  . triamterene-hydrochlorothiazide (MAXZIDE) 75-50 MG tablet Take 1 tablet by mouth daily. 90 tablet 0   No current facility-administered medications on file prior to visit.      Objective:  Objective  Physical Exam  Constitutional: She  is oriented to person, place, and time. She appears well-developed and well-nourished.  HENT:  Head: Normocephalic and atraumatic.  Eyes: Conjunctivae and EOM are normal.  Neck: Normal range of motion. Neck supple. No JVD present. Carotid bruit is not present. No thyromegaly present.  Cardiovascular: Normal rate, regular rhythm and normal heart sounds.  No murmur heard. Pulmonary/Chest: Effort normal and breath sounds normal. No  respiratory distress. She has no wheezes. She has no rales. She exhibits no tenderness.  Abdominal: Soft. Bowel sounds are normal. She exhibits no distension and no mass. There is no tenderness. There is no rebound and no guarding.  Musculoskeletal: She exhibits no edema.  Neurological: She is alert and oriented to person, place, and time.  Psychiatric: She has a normal mood and affect.  Nursing note and vitals reviewed.  Diabetic Foot Exam - Simple   Simple Foot Form Diabetic Foot exam was performed with the following findings:  Yes 07/26/2018  6:06 PM  Visual Inspection No deformities, no ulcerations, no other skin breakdown bilaterally:  Yes Sensation Testing Intact to touch and monofilament testing bilaterally:  Yes Pulse Check Posterior Tibialis and Dorsalis pulse intact bilaterally:  Yes Comments     BP 117/85   Pulse 71   Temp 98.4 F (36.9 C) (Oral)   Resp 16   Ht 5\' 3"  (1.6 m)   Wt 209 lb 3.2 oz (94.9 kg)   SpO2 99%   BMI 37.06 kg/m  Wt Readings from Last 3 Encounters:  08/02/18 209 lb 3.2 oz (94.9 kg)  01/29/18 201 lb 12.8 oz (91.5 kg)  10/31/17 202 lb (91.6 kg)     Lab Results  Component Value Date   WBC 5.0 10/05/2017   HGB 12.0 10/05/2017   HCT 36.5 10/05/2017   PLT 340.0 10/05/2017   GLUCOSE 197 (H) 08/02/2018   CHOL 190 08/02/2018   TRIG 205.0 (H) 08/02/2018   HDL 57.70 08/02/2018   LDLDIRECT 115.0 08/02/2018   LDLCALC 89 01/29/2018   ALT 15 08/02/2018   AST 16 08/02/2018   NA 140 08/02/2018   K 3.7 08/02/2018   CL 98 08/02/2018   CREATININE 0.96 08/02/2018   BUN 12 08/02/2018   CO2 32 08/02/2018   TSH 1.124 12/08/2014   INR 1.05 08/24/2017   HGBA1C 9.8 (H) 08/02/2018   MICROALBUR 1.3 11/10/2016    Mr Lumbar Spine W Wo Contrast  Result Date: 08/20/2017 CLINICAL DATA:  Severe RIGHT hip pain radiating to back for 2 days. Back pain beginning last night. Sepsis. History of diabetes. EXAM: MRI LUMBAR SPINE WITHOUT AND WITH CONTRAST TECHNIQUE:  Multiplanar and multiecho pulse sequences of the lumbar spine were obtained without and with intravenous contrast. CONTRAST:  65mL MULTIHANCE GADOBENATE DIMEGLUMINE 529 MG/ML IV SOLN COMPARISON:  None. FINDINGS: SEGMENTATION: For the purposes of this report, the last well-formed intervertebral disc will be reported as L5-S1. Transitional anatomy with small S1-2 disc, lumbarized S1 vertebral body. ALIGNMENT: Maintained lumbar lordosis. No malalignment. VERTEBRAE:Vertebral bodies are intact. Intervertebral discs demonstrate normal morphology, mild desiccation L5-S1 disc. Multilevel mild acute on chronic discogenic endplate changes. No abnormal osseous or suspicious disc enhancement. CONUS MEDULLARIS: Conus medullaris terminates at L2-3 and demonstrates normal morphology and signal characteristics. 2 mm tubular T1 bright signal along the filum terminale with chemical shift artifact consistent with fibro lipomatosis changes. No abnormal cord, leptomeningeal enhancement. PARASPINAL AND SOFT TISSUES: Faint enhancement and bright interstitial STIR signal paraspinal muscles mid to lower lumbar spine. No focal fluid collection. DISC LEVELS:  T12-L1 thru L2-3: No disc bulge, canal stenosis nor neural foraminal narrowing. L3-4: No disc bulge. Moderate RIGHT mild LEFT facet arthropathy without canal stenosis or neural foraminal narrowing. L4-5: Small RIGHT subarticular disc protrusion with faint enhancing annular fissure. Moderate RIGHT greater than LEFT facet arthropathy and ligamentum flavum redundancy. Trace RIGHT facet synovial cyst within dorsal epidural space. No canal stenosis. Mild RIGHT neural foraminal narrowing. L5-S1: Small LEFT subarticular disc protrusion. Central enhancing annular fissure. Severe facet arthropathy. No canal stenosis. Moderate RIGHT, moderate to severe LEFT neural foraminal narrowing. IMPRESSION: 1. No MR findings of discitis osteomyelitis. Faintly enhancing paraspinal muscles seen with low-grade  strain, less likely myositis. 2. Small RIGHT L4-5 disc protrusion with annular fissure may be acute. 3. Degenerative change of the lumbar spine. No canal stenosis. Neural foraminal narrowing L4-5 and L5-S1: Moderate to severe on the LEFT at L5-S1. 4. Low-lying spinal cord with fibro lipomatosis changes of filum terminale, no cord tethering. Electronically Signed   By: Elon Alas M.D.   On: 08/20/2017 22:43   Mr Hip Right W Wo Contrast  Result Date: 08/20/2017 CLINICAL DATA:  Right hip pain. No known injury. Suspect soft tissue infection. EXAM: MRI OF THE RIGHT HIP WITHOUT AND WITH CONTRAST TECHNIQUE: Multiplanar, multisequence MR imaging was performed both before and after administration of intravenous contrast. CONTRAST:  70mL MULTIHANCE GADOBENATE DIMEGLUMINE 529 MG/ML IV SOLN COMPARISON:  Right hip radiographs 08/20/2017. MRI lumbar spine 11/16/2012 FINDINGS: Bones: No focal bone lesions identified. Bone cortex appears intact. Marrow signal intensities are homogeneous and normal. Articular cartilage and labrum Articular cartilage:  Appears homogeneous and intact. Labrum:  Appears intact. Joint or bursal effusion Joint effusion: Small joint effusions are present bilaterally and are symmetrical. Bursae:  No abnormal bursal collections. Muscles and tendons Muscles and tendons: Normal appearance. No abnormal signal intensity changes, expansile process, solid mass, or fluid collections identified. Other findings Miscellaneous: Visualized portions of the pelvic organs are unremarkable. IMPRESSION: No acute process identified. No evidence of acute fracture, bone contusion, or inflammatory change involving the right hip or musculature. Electronically Signed   By: Lucienne Capers M.D.   On: 08/20/2017 23:25   Ct Abdomen Pelvis W Contrast  Result Date: 08/21/2017 CLINICAL DATA:  Back pain EXAM: CT ABDOMEN AND PELVIS WITH CONTRAST TECHNIQUE: Multidetector CT imaging of the abdomen and pelvis was performed  using the standard protocol following bolus administration of intravenous contrast. CONTRAST:  150mL ISOVUE-300 IOPAMIDOL (ISOVUE-300) INJECTION 61% COMPARISON:  08/20/2017, 11/10/2011 FINDINGS: Lower chest: Lung bases demonstrate patchy dependent atelectasis. No consolidation or pleural effusion. Heart size within normal limits. Hepatobiliary: Hepatic steatosis. Post cholecystectomy. Prominent extrahepatic bile duct likely due to postsurgical changes. Pancreas: Unremarkable. No pancreatic ductal dilatation or surrounding inflammatory changes. Spleen: Normal in size without focal abnormality. Adrenals/Urinary Tract: Adrenal glands are unremarkable. Kidneys are normal, without renal calculi, focal lesion, or hydronephrosis. Bladder is unremarkable. Stomach/Bowel: Stomach is within normal limits. Status post appendectomy. No evidence of bowel wall thickening, distention, or inflammatory changes. Vascular/Lymphatic: Aortic atherosclerosis. No enlarged abdominal or pelvic lymph nodes. Reproductive: Status post hysterectomy. No adnexal masses. Other: Negative for free air or free fluid.  Fat in the umbilicus Musculoskeletal: Degenerative changes. No acute or suspicious bone lesion IMPRESSION: 1. No CT evidence for acute intra-abdominal or pelvic abnormality 2. Hepatic steatosis Electronically Signed   By: Donavan Foil M.D.   On: 08/21/2017 02:17   Dg Chest Port 1 View  Result Date: 08/20/2017 CLINICAL DATA:  Nodes sepsis.  Fever.  Shortness  of breath. EXAM: PORTABLE CHEST 1 VIEW COMPARISON:  None. FINDINGS: The heart size and mediastinal contours are within normal limits. Both lungs are clear. The visualized skeletal structures are unremarkable. IMPRESSION: No active disease. Electronically Signed   By: Dorise Bullion III M.D   On: 08/20/2017 17:28   Dg Hip Unilat W Or Wo Pelvis 2-3 Views Right  Result Date: 08/20/2017 CLINICAL DATA:  Right hip pain.  No known injury. EXAM: DG HIP (WITH OR WITHOUT PELVIS) 2-3V  RIGHT COMPARISON:  None. FINDINGS: There is no evidence of hip fracture or dislocation. There is no evidence of arthropathy or other focal bone abnormality. IMPRESSION: Normal examination. Electronically Signed   By: Claudie Revering M.D.   On: 08/20/2017 09:18     Assessment & Plan:  Plan  I have discontinued Connye Burkitt. Diguglielmo's liraglutide. I am also having her maintain her albuterol, triamterene-hydrochlorothiazide, traZODone, olmesartan, cloNIDine, cephALEXin, empagliflozin, ezetimibe, omeprazole, albuterol, Potassium Chloride ER, traMADol, and cyclobenzaprine.  Meds ordered this encounter  Medications  . omeprazole (PRILOSEC) 20 MG capsule    Sig: Take 1 capsule (20 mg total) by mouth daily.    Dispense:  90 capsule    Refill:  3  . albuterol (PROVENTIL HFA;VENTOLIN HFA) 108 (90 Base) MCG/ACT inhaler    Sig: Inhale 2 puffs into the lungs every 6 (six) hours as needed. For shortness of breath.    Dispense:  1 Inhaler    Refill:  4  . Potassium Chloride ER 20 MEQ TBCR    Sig: Take 40 mEq by mouth daily.    Dispense:  60 tablet    Refill:  2    This is a new dose based on labs please discontinue previous RX.  . traMADol (ULTRAM) 50 MG tablet    Sig: Take 1 tablet (50 mg total) by mouth every 6 (six) hours as needed for moderate pain or severe pain.    Dispense:  30 tablet    Refill:  0  . cyclobenzaprine (FLEXERIL) 5 MG tablet    Sig: Take 1 tablet (5 mg total) by mouth 3 (three) times daily as needed for muscle spasms.    Dispense:  30 tablet    Refill:  0    Problem List Items Addressed This Visit      Unprioritized   Asthma, chronic, unspecified asthma severity, uncomplicated   Relevant Medications   albuterol (PROVENTIL HFA;VENTOLIN HFA) 108 (90 Base) MCG/ACT inhaler   Chronic right shoulder pain   Relevant Medications   traMADol (ULTRAM) 50 MG tablet   cyclobenzaprine (FLEXERIL) 5 MG tablet   DM (diabetes mellitus) type II uncontrolled, periph vascular disorder (HCC)     hgba1c acceptable, minimize simple carbs. Increase exercise as tolerated. Continue current meds      Relevant Orders   Hemoglobin A1c (Completed)   Comprehensive metabolic panel (Completed)   Lipid panel (Completed)   Essential hypertension (Chronic)    Well controlled, no changes to meds. Encouraged heart healthy diet such as the DASH diet and exercise as tolerated.       Relevant Medications   Potassium Chloride ER 20 MEQ TBCR   Other Relevant Orders   Comprehensive metabolic panel (Completed)   Hyperlipidemia - Primary    Encouraged heart healthy diet, increase exercise, avoid trans fats, consider a krill oil cap daily      Relevant Orders   Lipid panel (Completed)   Muscle spasm   Relevant Medications   cyclobenzaprine (FLEXERIL) 5 MG tablet  REFLUX, ESOPHAGEAL    Refill meds Watch acidic foods, caffeine, peppermint , spicy foods , etoh etc        Relevant Medications   omeprazole (PRILOSEC) 20 MG capsule    Other Visit Diagnoses    High risk medication use       Relevant Orders   Pain Mgmt, Profile 8 w/Conf, U (Completed)   Pain Mgmt, Tramadol w/medMATCH, U (Completed)      Follow-up: Return in about 6 months (around 02/01/2019), or if symptoms worsen or fail to improve, for fasting, annual exam.  [name], DO

## 2018-08-02 NOTE — Patient Instructions (Signed)

## 2018-08-03 LAB — COMPREHENSIVE METABOLIC PANEL
ALT: 15 U/L (ref 0–35)
AST: 16 U/L (ref 0–37)
Albumin: 4 g/dL (ref 3.5–5.2)
Alkaline Phosphatase: 85 U/L (ref 39–117)
BUN: 12 mg/dL (ref 6–23)
CO2: 32 mEq/L (ref 19–32)
Calcium: 9.2 mg/dL (ref 8.4–10.5)
Chloride: 98 mEq/L (ref 96–112)
Creatinine, Ser: 0.96 mg/dL (ref 0.40–1.20)
GFR: 77.38 mL/min (ref >60.00)
Glucose, Bld: 197 mg/dL — ABNORMAL HIGH (ref 70–99)
Potassium: 3.7 mEq/L (ref 3.5–5.1)
Sodium: 140 mEq/L (ref 135–145)
Total Bilirubin: 0.5 mg/dL (ref 0.2–1.2)
Total Protein: 7.3 g/dL (ref 6.0–8.3)

## 2018-08-03 LAB — LIPID PANEL
Cholesterol: 190 mg/dL (ref 0–200)
HDL: 57.7 mg/dL (ref >39.00)
NonHDL: 132.36
Total CHOL/HDL Ratio: 3
Triglycerides: 205 mg/dL — ABNORMAL HIGH (ref 0.0–149.0)
VLDL: 41 mg/dL — ABNORMAL HIGH (ref 0.0–40.0)

## 2018-08-03 LAB — HEMOGLOBIN A1C: Hgb A1c MFr Bld: 9.8 % — ABNORMAL HIGH (ref 4.6–6.5)

## 2018-08-03 LAB — LDL CHOLESTEROL, DIRECT: Direct LDL: 115 mg/dL

## 2018-08-04 LAB — PAIN MGMT, TRAMADOL W/MEDMATCH, U
Desmethyltramadol: NEGATIVE ng/mL (ref <100)
Tramadol: NEGATIVE ng/mL (ref <100)

## 2018-08-04 LAB — PAIN MGMT, PROFILE 8 W/CONF, U
6 Acetylmorphine: NEGATIVE ng/mL (ref <10)
Alcohol Metabolites: NEGATIVE ng/mL (ref <500)
Amphetamines: NEGATIVE ng/mL (ref <500)
Benzodiazepines: NEGATIVE ng/mL (ref <100)
Buprenorphine, Urine: NEGATIVE ng/mL (ref <5)
Cocaine Metabolite: NEGATIVE ng/mL (ref <150)
Creatinine: 122.5 mg/dL
MDMA: NEGATIVE ng/mL (ref <500)
Marijuana Metabolite: NEGATIVE ng/mL (ref <20)
Opiates: NEGATIVE ng/mL (ref <100)
Oxidant: NEGATIVE ug/mL (ref <200)
Oxycodone: NEGATIVE ng/mL (ref <100)
pH: 6.32 (ref 4.5–9.0)

## 2018-08-05 ENCOUNTER — Encounter: Payer: Self-pay | Admitting: Family Medicine

## 2018-08-05 DIAGNOSIS — M62838 Other muscle spasm: Secondary | ICD-10-CM | POA: Insufficient documentation

## 2018-08-05 DIAGNOSIS — G8929 Other chronic pain: Secondary | ICD-10-CM | POA: Insufficient documentation

## 2018-08-05 DIAGNOSIS — E781 Pure hyperglyceridemia: Secondary | ICD-10-CM | POA: Insufficient documentation

## 2018-08-05 DIAGNOSIS — J45909 Unspecified asthma, uncomplicated: Secondary | ICD-10-CM | POA: Insufficient documentation

## 2018-08-05 DIAGNOSIS — M25511 Pain in right shoulder: Secondary | ICD-10-CM

## 2018-08-05 NOTE — Assessment & Plan Note (Signed)
Well controlled, no changes to meds. Encouraged heart healthy diet such as the DASH diet and exercise as tolerated.  °

## 2018-08-05 NOTE — Assessment & Plan Note (Signed)
Encouraged heart healthy diet, increase exercise, avoid trans fats, consider a krill oil cap daily 

## 2018-08-05 NOTE — Assessment & Plan Note (Signed)
Refill meds Watch acidic foods, caffeine, peppermint , spicy foods , etoh etc

## 2018-08-05 NOTE — Assessment & Plan Note (Signed)
hgba1c acceptable, minimize simple carbs. Increase exercise as tolerated. Continue current meds 

## 2018-08-10 ENCOUNTER — Other Ambulatory Visit: Payer: Self-pay | Admitting: *Deleted

## 2018-08-10 DIAGNOSIS — E1165 Type 2 diabetes mellitus with hyperglycemia: Principal | ICD-10-CM

## 2018-08-10 DIAGNOSIS — E785 Hyperlipidemia, unspecified: Secondary | ICD-10-CM

## 2018-08-10 DIAGNOSIS — IMO0002 Reserved for concepts with insufficient information to code with codable children: Secondary | ICD-10-CM

## 2018-08-10 DIAGNOSIS — I10 Essential (primary) hypertension: Secondary | ICD-10-CM

## 2018-08-10 DIAGNOSIS — E1151 Type 2 diabetes mellitus with diabetic peripheral angiopathy without gangrene: Secondary | ICD-10-CM

## 2018-08-10 DIAGNOSIS — K219 Gastro-esophageal reflux disease without esophagitis: Secondary | ICD-10-CM

## 2018-08-10 MED ORDER — ROSUVASTATIN CALCIUM 10 MG PO TABS: 10.0000 mg | ORAL_TABLET | Freq: Every day | ORAL | 3 refills | Status: DC

## 2018-08-14 ENCOUNTER — Other Ambulatory Visit: Payer: Medicare HMO

## 2018-08-21 ENCOUNTER — Ambulatory Visit: Payer: Self-pay

## 2018-08-21 NOTE — Telephone Encounter (Signed)
Pt called and stated that last night after taking her new medication Rosuvastatin she became SOB and felt her throat swelling.. She stated that she was able to drink some water but didn't try food since it was late. Symptoms subsided and have not returned. No rash. Pt states her lips did not swell. Pt refused appointment today with PCP per protocol. Pt states she has no transportation. Appointment was scheduled per pt request Thursday 08/23/18. Pt was advised not to take the Rosuvastatin again until advised by her PCP. Pt verbalized understanding. Care advice read to patient. Pt verbalized understanding of all instructions.  Reason for Disposition . [1] Swallowing difficulty AND [2] cause unknown (Exception: difficulty swallowing is a chronic symptom)  Answer Assessment - Initial Assessment Questions 1. SYMPTOM: "Are you having difficulty swallowing liquids, solids, or both?"     She drank water 2. ONSET: "When did the swallowing problems begin?"      After taking cholesterol medication rosuvastatin 3. CAUSE: "What do you think is causing the problem?"    Medication first time taking 4. CHRONIC/RECURRENT: "Is this a new problem for you?"  If no, ask: "How long have you had this problem?" (e.g., days, weeks, months)      No recurrent 5. OTHER SYMPTOMS: "Do you have any other symptoms?" (e.g., difficulty breathing, sore throat, swollen tongue, chest pain)     SOB with reaction 6. PREGNANCY: "Is there any chance you are pregnant?" "When was your last menstrual period?"     No  Protocols used: SWALLOWING DIFFICULTY-A-AH

## 2018-08-23 ENCOUNTER — Ambulatory Visit (INDEPENDENT_AMBULATORY_CARE_PROVIDER_SITE_OTHER): Payer: Medicare HMO | Admitting: Family Medicine

## 2018-08-23 ENCOUNTER — Encounter: Payer: Self-pay | Admitting: Family Medicine

## 2018-08-23 VITALS — BP 159/98 | HR 88 | Temp 98.4°F | Resp 16 | Ht 63.0 in | Wt 210.0 lb

## 2018-08-23 DIAGNOSIS — I10 Essential (primary) hypertension: Secondary | ICD-10-CM | POA: Diagnosis not present

## 2018-08-23 DIAGNOSIS — E781 Pure hyperglyceridemia: Secondary | ICD-10-CM | POA: Diagnosis not present

## 2018-08-23 DIAGNOSIS — T50905A Adverse effect of unspecified drugs, medicaments and biological substances, initial encounter: Secondary | ICD-10-CM | POA: Insufficient documentation

## 2018-08-23 DIAGNOSIS — IMO0002 Reserved for concepts with insufficient information to code with codable children: Secondary | ICD-10-CM

## 2018-08-23 DIAGNOSIS — E1165 Type 2 diabetes mellitus with hyperglycemia: Secondary | ICD-10-CM

## 2018-08-23 DIAGNOSIS — E1151 Type 2 diabetes mellitus with diabetic peripheral angiopathy without gangrene: Secondary | ICD-10-CM

## 2018-08-23 MED ORDER — GLUCOSE BLOOD VI STRP: ORAL_STRIP | 12 refills | Status: DC

## 2018-08-23 MED ORDER — SITAGLIPTIN PHOSPHATE 100 MG PO TABS: 100.0000 mg | ORAL_TABLET | Freq: Every day | ORAL | 2 refills | Status: DC

## 2018-08-23 MED ORDER — ONETOUCH ULTRASOFT LANCETS MISC: 12 refills | Status: DC

## 2018-08-23 MED ORDER — AMLODIPINE BESYLATE 5 MG PO TABS: 5.0000 mg | ORAL_TABLET | Freq: Every day | ORAL | 3 refills | Status: DC

## 2018-08-23 MED ORDER — FENOFIBRATE 160 MG PO TABS: 160.0000 mg | ORAL_TABLET | Freq: Every day | ORAL | 1 refills | Status: DC

## 2018-08-23 NOTE — Assessment & Plan Note (Signed)
Stop statin Instructed pt to keep benadryl in the house  If it ever occurs again-- go to ER

## 2018-08-23 NOTE — Progress Notes (Signed)
Patient ID: Heather Walker, female    DOB: [birth year]  Age: 57 y.o. MRN: [id number]    Subjective:  Subjective  HPI Heather Walker presents for f/u allergic reaction to crestor.  Her tongue swelled and she could not swallow--- it lasted 2-3 hours ---she took no otc meds ----symptoms have resolved.      Review of Systems  Constitutional: Negative for chills and fever.  HENT: Negative for congestion and hearing loss.   Eyes: Negative for discharge.  Respiratory: Negative for cough and shortness of breath.   Cardiovascular: Negative for chest pain, palpitations and leg swelling.  Gastrointestinal: Negative for abdominal pain, blood in stool, constipation, diarrhea, nausea and vomiting.  Genitourinary: Negative for dysuria, frequency, hematuria and urgency.  Musculoskeletal: Negative for back pain and myalgias.  Skin: Negative for rash.  Allergic/Immunologic: Negative for environmental allergies.  Neurological: Negative for dizziness, weakness and headaches.  Hematological: Does not bruise/bleed easily.  Psychiatric/Behavioral: Negative for suicidal ideas. The patient is not nervous/anxious.     History Past Medical History:  Diagnosis Date  . Arthritis   . Asthma   . Depression   . Diabetes mellitus without complication ([place])   . Heart murmur   . Hypertension     She has a past surgical history that includes Abdominal hysterectomy; Bladder suspension; Appendectomy; Cesarean section; Tubal ligation; and TEE without cardioversion (N/A, 08/24/2017).   Her family history includes Aneurysm in her sister; Asthma in her mother, sister, sister, and sister; Heart defect in her sister; Hypertension in her maternal grandmother, mother, and sister.She reports that she has never smoked. She has never used smokeless tobacco. She reports that she does not drink alcohol or use drugs.  Current Outpatient Medications on File Prior to Visit  Medication Sig Dispense Refill  . albuterol (PROVENTIL  HFA;VENTOLIN HFA) 108 (90 Base) MCG/ACT inhaler Inhale 2 puffs into the lungs every 6 (six) hours as needed. For shortness of breath. 1 Inhaler 4  . albuterol (PROVENTIL) (2.5 MG/3ML) 0.083% nebulizer solution Take 3 mLs (2.5 mg total) by nebulization every 6 (six) hours as needed for wheezing or shortness of breath. 150 mL 1  . cloNIDine (CATAPRES) 0.2 MG tablet Take 1 tablet (0.2 mg total) by mouth 2 (two) times daily. 180 tablet 1  . cyclobenzaprine (FLEXERIL) 5 MG tablet Take 1 tablet (5 mg total) by mouth 3 (three) times daily as needed for muscle spasms. 30 tablet 0  . olmesartan (BENICAR) 40 MG tablet Take 1 tablet (40 mg total) by mouth daily. 30 tablet 2  . omeprazole (PRILOSEC) 20 MG capsule Take 1 capsule (20 mg total) by mouth daily. 90 capsule 3  . Potassium Chloride ER 20 MEQ TBCR Take 40 mEq by mouth daily. 60 tablet 2  . traMADol (ULTRAM) 50 MG tablet Take 1 tablet (50 mg total) by mouth every 6 (six) hours as needed for moderate pain or severe pain. 30 tablet 0  . traZODone (DESYREL) 50 MG tablet Take 0.5-1 tablets (25-50 mg total) by mouth at bedtime as needed for sleep. 30 tablet 3  . triamterene-hydrochlorothiazide (MAXZIDE) 75-50 MG tablet Take 1 tablet by mouth daily. 90 tablet 0  . cephALEXin (KEFLEX) 500 MG capsule Take 1 capsule (500 mg total) by mouth 2 (two) times daily. (Patient not taking: Reported on 08/23/2018) 20 capsule 0   No current facility-administered medications on file prior to visit.      Objective:  Objective  Physical Exam  Constitutional: She is oriented to person,  place, and time. She appears well-developed and well-nourished. No distress.  HENT:  Right Ear: External ear normal.  Left Ear: External ear normal.  Nose: Nose normal.  Mouth/Throat: Oropharynx is clear and moist.  Eyes: Pupils are equal, round, and reactive to light. EOM are normal.  Neck: Normal range of motion. Neck supple.  Cardiovascular: Normal rate, regular rhythm and normal  heart sounds.  No murmur heard. Pulmonary/Chest: Effort normal and breath sounds normal. No respiratory distress. She has no wheezes. She has no rales. She exhibits no tenderness.  Neurological: She is alert and oriented to person, place, and time.  Psychiatric: She has a normal mood and affect. Her behavior is normal. Judgment and thought content normal.  Nursing note and vitals reviewed.  BP (!) 159/98 (BP Location: Right Arm, Patient Position: Sitting, Cuff Size: Small)   Pulse 88   Temp 98.4 F (36.9 C) (Oral)   Resp 16   Ht 5\' 3"  (1.6 m)   Wt 210 lb (95.3 kg)   SpO2 99%   BMI 37.20 kg/m  Wt Readings from Last 3 Encounters:  08/23/18 210 lb (95.3 kg)  08/02/18 209 lb 3.2 oz (94.9 kg)  01/29/18 201 lb 12.8 oz (91.5 kg)     Lab Results  Component Value Date   WBC 5.0 10/05/2017   HGB 12.0 10/05/2017   HCT 36.5 10/05/2017   PLT 340.0 10/05/2017   GLUCOSE 197 (H) 08/02/2018   CHOL 190 08/02/2018   TRIG 205.0 (H) 08/02/2018   HDL 57.70 08/02/2018   LDLDIRECT 115.0 08/02/2018   LDLCALC 89 01/29/2018   ALT 15 08/02/2018   AST 16 08/02/2018   NA 140 08/02/2018   K 3.7 08/02/2018   CL 98 08/02/2018   CREATININE 0.96 08/02/2018   BUN 12 08/02/2018   CO2 32 08/02/2018   TSH 1.124 12/08/2014   INR 1.05 08/24/2017   HGBA1C 9.8 (H) 08/02/2018   MICROALBUR 1.3 11/10/2016    Mr Lumbar Spine W Wo Contrast  Result Date: 08/20/2017 CLINICAL DATA:  Severe RIGHT hip pain radiating to back for 2 days. Back pain beginning last night. Sepsis. History of diabetes. EXAM: MRI LUMBAR SPINE WITHOUT AND WITH CONTRAST TECHNIQUE: Multiplanar and multiecho pulse sequences of the lumbar spine were obtained without and with intravenous contrast. CONTRAST:  69mL MULTIHANCE GADOBENATE DIMEGLUMINE 529 MG/ML IV SOLN COMPARISON:  None. FINDINGS: SEGMENTATION: For the purposes of this report, the last well-formed intervertebral disc will be reported as L5-S1. Transitional anatomy with small S1-2 disc,  lumbarized S1 vertebral body. ALIGNMENT: Maintained lumbar lordosis. No malalignment. VERTEBRAE:Vertebral bodies are intact. Intervertebral discs demonstrate normal morphology, mild desiccation L5-S1 disc. Multilevel mild acute on chronic discogenic endplate changes. No abnormal osseous or suspicious disc enhancement. CONUS MEDULLARIS: Conus medullaris terminates at L2-3 and demonstrates normal morphology and signal characteristics. 2 mm tubular T1 bright signal along the filum terminale with chemical shift artifact consistent with fibro lipomatosis changes. No abnormal cord, leptomeningeal enhancement. PARASPINAL AND SOFT TISSUES: Faint enhancement and bright interstitial STIR signal paraspinal muscles mid to lower lumbar spine. No focal fluid collection. DISC LEVELS: T12-L1 thru L2-3: No disc bulge, canal stenosis nor neural foraminal narrowing. L3-4: No disc bulge. Moderate RIGHT mild LEFT facet arthropathy without canal stenosis or neural foraminal narrowing. L4-5: Small RIGHT subarticular disc protrusion with faint enhancing annular fissure. Moderate RIGHT greater than LEFT facet arthropathy and ligamentum flavum redundancy. Trace RIGHT facet synovial cyst within dorsal epidural space. No canal stenosis. Mild RIGHT neural foraminal narrowing.  L5-S1: Small LEFT subarticular disc protrusion. Central enhancing annular fissure. Severe facet arthropathy. No canal stenosis. Moderate RIGHT, moderate to severe LEFT neural foraminal narrowing. IMPRESSION: 1. No MR findings of discitis osteomyelitis. Faintly enhancing paraspinal muscles seen with low-grade strain, less likely myositis. 2. Small RIGHT L4-5 disc protrusion with annular fissure may be acute. 3. Degenerative change of the lumbar spine. No canal stenosis. Neural foraminal narrowing L4-5 and L5-S1: Moderate to severe on the LEFT at L5-S1. 4. Low-lying spinal cord with fibro lipomatosis changes of filum terminale, no cord tethering. Electronically Signed   By:  Elon Alas M.D.   On: 08/20/2017 22:43   Mr Hip Right W Wo Contrast  Result Date: 08/20/2017 CLINICAL DATA:  Right hip pain. No known injury. Suspect soft tissue infection. EXAM: MRI OF THE RIGHT HIP WITHOUT AND WITH CONTRAST TECHNIQUE: Multiplanar, multisequence MR imaging was performed both before and after administration of intravenous contrast. CONTRAST:  20mL MULTIHANCE GADOBENATE DIMEGLUMINE 529 MG/ML IV SOLN COMPARISON:  Right hip radiographs 08/20/2017. MRI lumbar spine 11/16/2012 FINDINGS: Bones: No focal bone lesions identified. Bone cortex appears intact. Marrow signal intensities are homogeneous and normal. Articular cartilage and labrum Articular cartilage:  Appears homogeneous and intact. Labrum:  Appears intact. Joint or bursal effusion Joint effusion: Small joint effusions are present bilaterally and are symmetrical. Bursae:  No abnormal bursal collections. Muscles and tendons Muscles and tendons: Normal appearance. No abnormal signal intensity changes, expansile process, solid mass, or fluid collections identified. Other findings Miscellaneous: Visualized portions of the pelvic organs are unremarkable. IMPRESSION: No acute process identified. No evidence of acute fracture, bone contusion, or inflammatory change involving the right hip or musculature. Electronically Signed   By: Lucienne Capers M.D.   On: 08/20/2017 23:25   Ct Abdomen Pelvis W Contrast  Result Date: 08/21/2017 CLINICAL DATA:  Back pain EXAM: CT ABDOMEN AND PELVIS WITH CONTRAST TECHNIQUE: Multidetector CT imaging of the abdomen and pelvis was performed using the standard protocol following bolus administration of intravenous contrast. CONTRAST:  153mL ISOVUE-300 IOPAMIDOL (ISOVUE-300) INJECTION 61% COMPARISON:  08/20/2017, 11/10/2011 FINDINGS: Lower chest: Lung bases demonstrate patchy dependent atelectasis. No consolidation or pleural effusion. Heart size within normal limits. Hepatobiliary: Hepatic steatosis. Post  cholecystectomy. Prominent extrahepatic bile duct likely due to postsurgical changes. Pancreas: Unremarkable. No pancreatic ductal dilatation or surrounding inflammatory changes. Spleen: Normal in size without focal abnormality. Adrenals/Urinary Tract: Adrenal glands are unremarkable. Kidneys are normal, without renal calculi, focal lesion, or hydronephrosis. Bladder is unremarkable. Stomach/Bowel: Stomach is within normal limits. Status post appendectomy. No evidence of bowel wall thickening, distention, or inflammatory changes. Vascular/Lymphatic: Aortic atherosclerosis. No enlarged abdominal or pelvic lymph nodes. Reproductive: Status post hysterectomy. No adnexal masses. Other: Negative for free air or free fluid.  Fat in the umbilicus Musculoskeletal: Degenerative changes. No acute or suspicious bone lesion IMPRESSION: 1. No CT evidence for acute intra-abdominal or pelvic abnormality 2. Hepatic steatosis Electronically Signed   By: Donavan Foil M.D.   On: 08/21/2017 02:17   Dg Chest Port 1 View  Result Date: 08/20/2017 CLINICAL DATA:  Nodes sepsis.  Fever.  Shortness of breath. EXAM: PORTABLE CHEST 1 VIEW COMPARISON:  None. FINDINGS: The heart size and mediastinal contours are within normal limits. Both lungs are clear. The visualized skeletal structures are unremarkable. IMPRESSION: No active disease. Electronically Signed   By: Dorise Bullion III M.D   On: 08/20/2017 17:28   Dg Hip Unilat W Or Wo Pelvis 2-3 Views Right  Result Date: 08/20/2017 CLINICAL DATA:  Right hip pain.  No known injury. EXAM: DG HIP (WITH OR WITHOUT PELVIS) 2-3V RIGHT COMPARISON:  None. FINDINGS: There is no evidence of hip fracture or dislocation. There is no evidence of arthropathy or other focal bone abnormality. IMPRESSION: Normal examination. Electronically Signed   By: Claudie Revering M.D.   On: 08/20/2017 09:18     Assessment & Plan:  Plan  I have discontinued Connye Burkitt. Fetch's empagliflozin and rosuvastatin. I am  also having her start on fenofibrate, sitaGLIPtin, amLODipine, glucose blood, and onetouch ultrasoft. Additionally, I am having her maintain her albuterol, triamterene-hydrochlorothiazide, traZODone, olmesartan, cloNIDine, cephALEXin, omeprazole, albuterol, Potassium Chloride ER, traMADol, and cyclobenzaprine.  Meds ordered this encounter  Medications  . fenofibrate 160 MG tablet    Sig: Take 1 tablet (160 mg total) by mouth daily.    Dispense:  90 tablet    Refill:  1  . sitaGLIPtin (JANUVIA) 100 MG tablet    Sig: Take 1 tablet (100 mg total) by mouth daily.    Dispense:  90 tablet    Refill:  2  . amLODipine (NORVASC) 5 MG tablet    Sig: Take 1 tablet (5 mg total) by mouth daily.    Dispense:  30 tablet    Refill:  3  . glucose blood test strip    Sig: Use as instructed    Dispense:  100 each    Refill:  12  . Lancets (ONETOUCH ULTRASOFT) lancets    Sig: Use as instructed    Dispense:  100 each    Refill:  12    Problem List Items Addressed This Visit      Unprioritized   Allergic reaction due to correct medicinal substance properly administered    Stop statin Instructed pt to keep benadryl in the house  If it ever occurs again-- go to ER       DM (diabetes mellitus) type II uncontrolled, periph vascular disorder (HCC)   Relevant Medications   fenofibrate 160 MG tablet   sitaGLIPtin (JANUVIA) 100 MG tablet   amLODipine (NORVASC) 5 MG tablet   glucose blood test strip   Lancets (ONETOUCH ULTRASOFT) lancets   Essential hypertension (Chronic)    Well controlled, no changes to meds. Encouraged heart healthy diet such as the DASH diet and exercise as tolerated.       Relevant Medications   fenofibrate 160 MG tablet   amLODipine (NORVASC) 5 MG tablet   Hypertriglyceridemia - Primary    Fenofibrate daily Recheck labs 3 months      Relevant Medications   fenofibrate 160 MG tablet   amLODipine (NORVASC) 5 MG tablet      Follow-up: Return in about 3 months (around  11/23/2018), or if symptoms worsen or fail to improve, for hypertension, hyperlipidemia, diabetes II.  [name], DO

## 2018-08-23 NOTE — Patient Instructions (Signed)
DASH Eating Plan DASH stands for "Dietary Approaches to Stop Hypertension." The DASH eating plan is a healthy eating plan that has been shown to reduce high blood pressure (hypertension). It may also reduce your risk for type 2 diabetes, heart disease, and stroke. The DASH eating plan may also help with weight loss. What are tips for following this plan? General guidelines  Avoid eating more than 2,300 mg (milligrams) of salt (sodium) a day. If you have hypertension, you may need to reduce your sodium intake to 1,500 mg a day.  Limit alcohol intake to no more than 1 drink a day for nonpregnant women and 2 drinks a day for men. One drink equals 12 oz of beer, 5 oz of wine, or 1 oz of hard liquor.  Work with your health care provider to maintain a healthy body weight or to lose weight. Ask what an ideal weight is for you.  Get at least 30 minutes of exercise that causes your heart to beat faster (aerobic exercise) most days of the week. Activities may include walking, swimming, or biking.  Work with your health care provider or diet and nutrition specialist (dietitian) to adjust your eating plan to your individual calorie needs. Reading food labels  Check food labels for the amount of sodium per serving. Choose foods with less than 5 percent of the Daily Value of sodium. Generally, foods with less than 300 mg of sodium per serving fit into this eating plan.  To find whole grains, look for the word "whole" as the first word in the ingredient list. Shopping  Buy products labeled as "low-sodium" or "no salt added."  Buy fresh foods. Avoid canned foods and premade or frozen meals. Cooking  Avoid adding salt when cooking. Use salt-free seasonings or herbs instead of table salt or sea salt. Check with your health care provider or pharmacist before using salt substitutes.  Do not fry foods. Cook foods using healthy methods such as baking, boiling, grilling, and broiling instead.  Cook with  heart-healthy oils, such as olive, canola, soybean, or sunflower oil. Meal planning   Eat a balanced diet that includes: ? 5 or more servings of fruits and vegetables each day. At each meal, try to fill half of your plate with fruits and vegetables. ? Up to 6-8 servings of whole grains each day. ? Less than 6 oz of lean meat, poultry, or fish each day. A 3-oz serving of meat is about the same size as a deck of cards. One egg equals 1 oz. ? 2 servings of low-fat dairy each day. ? A serving of nuts, seeds, or beans 5 times each week. ? Heart-healthy fats. Healthy fats called Omega-3 fatty acids are found in foods such as flaxseeds and coldwater fish, like sardines, salmon, and mackerel.  Limit how much you eat of the following: ? Canned or prepackaged foods. ? Food that is high in trans fat, such as fried foods. ? Food that is high in saturated fat, such as fatty meat. ? Sweets, desserts, sugary drinks, and other foods with added sugar. ? Full-fat dairy products.  Do not salt foods before eating.  Try to eat at least 2 vegetarian meals each week.  Eat more home-cooked food and less restaurant, buffet, and fast food.  When eating at a restaurant, ask that your food be prepared with less salt or no salt, if possible. What foods are recommended? The items listed may not be a complete list. Talk with your dietitian about what   dietary choices are best for you. Grains Whole-grain or whole-wheat bread. Whole-grain or whole-wheat pasta. Brown rice. Oatmeal. Quinoa. Bulgur. Whole-grain and low-sodium cereals. Pita bread. Low-fat, low-sodium crackers. Whole-wheat flour tortillas. Vegetables Fresh or frozen vegetables (raw, steamed, roasted, or grilled). Low-sodium or reduced-sodium tomato and vegetable juice. Low-sodium or reduced-sodium tomato sauce and tomato paste. Low-sodium or reduced-sodium canned vegetables. Fruits All fresh, dried, or frozen fruit. Canned fruit in natural juice (without  added sugar). Meat and other protein foods Skinless chicken or turkey. Ground chicken or turkey. Pork with fat trimmed off. Fish and seafood. Egg whites. Dried beans, peas, or lentils. Unsalted nuts, nut butters, and seeds. Unsalted canned beans. Lean cuts of beef with fat trimmed off. Low-sodium, lean deli meat. Dairy Low-fat (1%) or fat-free (skim) milk. Fat-free, low-fat, or reduced-fat cheeses. Nonfat, low-sodium ricotta or cottage cheese. Low-fat or nonfat yogurt. Low-fat, low-sodium cheese. Fats and oils Soft margarine without trans fats. Vegetable oil. Low-fat, reduced-fat, or light mayonnaise and salad dressings (reduced-sodium). Canola, safflower, olive, soybean, and sunflower oils. Avocado. Seasoning and other foods Herbs. Spices. Seasoning mixes without salt. Unsalted popcorn and pretzels. Fat-free sweets. What foods are not recommended? The items listed may not be a complete list. Talk with your dietitian about what dietary choices are best for you. Grains Baked goods made with fat, such as croissants, muffins, or some breads. Dry pasta or rice meal packs. Vegetables Creamed or fried vegetables. Vegetables in a cheese sauce. Regular canned vegetables (not low-sodium or reduced-sodium). Regular canned tomato sauce and paste (not low-sodium or reduced-sodium). Regular tomato and vegetable juice (not low-sodium or reduced-sodium). Pickles. Olives. Fruits Canned fruit in a light or heavy syrup. Fried fruit. Fruit in cream or butter sauce. Meat and other protein foods Fatty cuts of meat. Ribs. Fried meat. Bacon. Sausage. Bologna and other processed lunch meats. Salami. Fatback. Hotdogs. Bratwurst. Salted nuts and seeds. Canned beans with added salt. Canned or smoked fish. Whole eggs or egg yolks. Chicken or turkey with skin. Dairy Whole or 2% milk, cream, and half-and-half. Whole or full-fat cream cheese. Whole-fat or sweetened yogurt. Full-fat cheese. Nondairy creamers. Whipped toppings.  Processed cheese and cheese spreads. Fats and oils Butter. Stick margarine. Lard. Shortening. Ghee. Bacon fat. Tropical oils, such as coconut, palm kernel, or palm oil. Seasoning and other foods Salted popcorn and pretzels. Onion salt, garlic salt, seasoned salt, table salt, and sea salt. Worcestershire sauce. Tartar sauce. Barbecue sauce. Teriyaki sauce. Soy sauce, including reduced-sodium. Steak sauce. Canned and packaged gravies. Fish sauce. Oyster sauce. Cocktail sauce. Horseradish that you find on the shelf. Ketchup. Mustard. Meat flavorings and tenderizers. Bouillon cubes. Hot sauce and Tabasco sauce. Premade or packaged marinades. Premade or packaged taco seasonings. Relishes. Regular salad dressings. Where to find more information:  National Heart, Lung, and Blood Institute: www.nhlbi.nih.gov  American Heart Association: www.heart.org Summary  The DASH eating plan is a healthy eating plan that has been shown to reduce high blood pressure (hypertension). It may also reduce your risk for type 2 diabetes, heart disease, and stroke.  With the DASH eating plan, you should limit salt (sodium) intake to 2,300 mg a day. If you have hypertension, you may need to reduce your sodium intake to 1,500 mg a day.  When on the DASH eating plan, aim to eat more fresh fruits and vegetables, whole grains, lean proteins, low-fat dairy, and heart-healthy fats.  Work with your health care provider or diet and nutrition specialist (dietitian) to adjust your eating plan to your individual   calorie needs. This information is not intended to replace advice given to you by your health care provider. Make sure you discuss any questions you have with your health care provider. Document Released: 09/22/2011 Document Revised: 09/26/2016 Document Reviewed: 09/26/2016 Elsevier Interactive Patient Education  2018 [employer]  

## 2018-08-23 NOTE — Assessment & Plan Note (Signed)
Fenofibrate daily Recheck labs 3 months

## 2018-08-23 NOTE — Assessment & Plan Note (Signed)
Well controlled, no changes to meds. Encouraged heart healthy diet such as the DASH diet and exercise as tolerated.  °

## 2018-08-25 ENCOUNTER — Other Ambulatory Visit: Payer: Self-pay | Admitting: Family Medicine

## 2018-08-25 DIAGNOSIS — I10 Essential (primary) hypertension: Secondary | ICD-10-CM

## 2018-09-06 ENCOUNTER — Telehealth: Payer: Self-pay | Admitting: *Deleted

## 2018-09-06 NOTE — Telephone Encounter (Signed)
Copied from [place] [id number]. Topic: Quick Communication - Office Called Patient (Clinic Use ONLY) >> Sep 05, 2018 12:55 PM Yvette Rack wrote: Reason for CRM: pt states that someone had called her an she was returning the call

## 2018-09-06 NOTE — Telephone Encounter (Signed)
Nothing notated in chart regarding call. Deferred to Dr. Nonda Lou CMA to confirm.

## 2018-09-07 NOTE — Telephone Encounter (Signed)
No call documented from office.

## 2018-09-11 NOTE — Progress Notes (Deleted)
Name: Heather Walker  MRN/ DOB: [id number], 20-Nov-1960   Age/ Sex: 57 y.o., female    PCP: [name], DO   Reason for Endocrinology Evaluation: Type 2 Diabetes Mellitus     Date of Initial Endocrinology Visit: 09/12/2018     PATIENT IDENTIFIER: [name] is a 57 y.o. female with a past medical history of ***. The patient presented for initial endocrinology clinic visit on 09/12/2018 for consultative assistance with her diabetes management.    HPI: Ms. Munos was    Diagnosed with DM *** Prior Medications tried/Intolerance: *** Currently checking blood sugars *** x / day,  before breakfast and ***.  Hypoglycemia episodes : ***               Symptoms: ***                 Frequency: ***/  Hemoglobin A1c has ranged from 8.5% in 2016, peaking at 9.8% in 07/2018. Patient required assistance for hypoglycemia:  Patient has required hospitalization within the last 1 year from hyper or hypoglycemia:   In terms of diet, the patient ***   HOME DIABETES REGIMEN: Basal: ***  Bolus: ***   Statin: {Yes/No:11203} ACE-I/ARB: {YES/NO:17245} Prior Diabetic Education: {Yes/No:11203}   METER DOWNLOAD SUMMARY: Date range evaluated: *** Fingerstick Blood Glucose Tests = *** Average Number Tests/Day = *** Overall Mean FS Glucose = *** Standard Deviation = ***  BG Ranges: Low = *** High = ***   Hypoglycemic Events/30 Days: BG < 50 = *** Episodes of symptomatic severe hypoglycemia = ***   DIABETIC COMPLICATIONS: Microvascular complications:   ***  Denies: ***  Last eye exam: Completed   Macrovascular complications:   ***  Denies: CAD, PVD, CVA   PAST HISTORY: Past Medical History:  Past Medical History:  Diagnosis Date  . Arthritis   . Asthma   . Depression   . Diabetes mellitus without complication ([place])   . Heart murmur   . Hypertension     Past Surgical History:  Past Surgical History:  Procedure Laterality Date  . ABDOMINAL  HYSTERECTOMY    . APPENDECTOMY    . BLADDER SUSPENSION    . CESAREAN SECTION     3 previous  . TEE WITHOUT CARDIOVERSION N/A 08/24/2017   Procedure: TRANSESOPHAGEAL ECHOCARDIOGRAM (TEE);  Surgeon: Larey Dresser, MD;  Location: [facility] ENDOSCOPY;  Service: Cardiovascular;  Laterality: N/A;  . TUBAL LIGATION        Social History:  reports that she has never smoked. She has never used smokeless tobacco. She reports that she does not drink alcohol or use drugs. Family History:  Family History  Problem Relation Age of Onset  . Hypertension Mother   . Asthma Mother   . Hypertension Sister   . Asthma Sister   . Hypertension Maternal Grandmother   . Heart defect Sister   . Asthma Sister   . Aneurysm Sister   . Asthma Sister       HOME MEDICATIONS: Allergies as of 09/12/2018      Reactions   Crestor [rosuvastatin Calcium] Anaphylaxis   Metformin And Related Diarrhea   Nausea and diarrhea   Atorvastatin Other (See Comments)   Neck stiffness   Codeine Itching      Medication List        Accurate as of 09/11/18 12:54 PM. Always use your most recent med list.          albuterol (2.5 MG/3ML) 0.083% nebulizer  solution Commonly known as:  PROVENTIL Take 3 mLs (2.5 mg total) by nebulization every 6 (six) hours as needed for wheezing or shortness of breath.   albuterol 108 (90 Base) MCG/ACT inhaler Commonly known as:  PROVENTIL HFA;VENTOLIN HFA Inhale 2 puffs into the lungs every 6 (six) hours as needed. For shortness of breath.   amLODipine 5 MG tablet Commonly known as:  NORVASC Take 1 tablet (5 mg total) by mouth daily.   cephALEXin 500 MG capsule Commonly known as:  KEFLEX Take 1 capsule (500 mg total) by mouth 2 (two) times daily.   cloNIDine 0.2 MG tablet Commonly known as:  CATAPRES Take 1 tablet (0.2 mg total) by mouth 2 (two) times daily.   cyclobenzaprine 5 MG tablet Commonly known as:  FLEXERIL Take 1 tablet (5 mg total) by mouth 3 (three) times daily as  needed for muscle spasms.   fenofibrate 160 MG tablet Take 1 tablet (160 mg total) by mouth daily.   glucose blood test strip Use as instructed   olmesartan 40 MG tablet Commonly known as:  BENICAR Take 1 tablet (40 mg total) by mouth daily.   omeprazole 20 MG capsule Commonly known as:  PRILOSEC Take 1 capsule (20 mg total) by mouth daily.   onetouch ultrasoft lancets Use as instructed   Potassium Chloride ER 20 MEQ Tbcr Take 40 mEq by mouth daily.   sitaGLIPtin 100 MG tablet Commonly known as:  JANUVIA Take 1 tablet (100 mg total) by mouth daily.   traMADol 50 MG tablet Commonly known as:  ULTRAM Take 1 tablet (50 mg total) by mouth every 6 (six) hours as needed for moderate pain or severe pain.   traZODone 50 MG tablet Commonly known as:  DESYREL Take 0.5-1 tablets (25-50 mg total) by mouth at bedtime as needed for sleep.   triamterene-hydrochlorothiazide 75-50 MG tablet Commonly known as:  MAXZIDE TAKE 1 TABLET BY MOUTH DAILY        ALLERGIES: Allergies  Allergen Reactions  . Crestor [Rosuvastatin Calcium] Anaphylaxis  . Metformin And Related Diarrhea    Nausea and diarrhea  . Atorvastatin Other (See Comments)    Neck stiffness  . Codeine Itching     REVIEW OF SYSTEMS: A comprehensive ROS was conducted with the patient and is negative except as per HPI and below:  ROS    OBJECTIVE:   VITAL SIGNS: There were no vitals taken for this visit.   PHYSICAL EXAM:  General: Pt appears well and is in NAD  Hydration: Well-hydrated with moist mucous membranes and good skin turgor  HEENT: Head: Unremarkable with good dentition. Oropharynx clear without exudate.  Eyes: External eye exam normal without stare, lid lag or exophthalmos.  EOM intact.  PERRL.  Neck: General: Supple without adenopathy or carotid bruits. Thyroid: Thyroid size normal.  No goiter or nodules appreciated. No thyroid bruit.  Lungs: Clear with good BS bilat with no rales, rhonchi, or  wheezes  Heart: RRR with normal S1 and S2 and no gallops; no murmurs; no rub  Abdomen: Normoactive bowel sounds, soft, nontender, without masses or organomegaly palpable  Extremities:  Lower extremities - No pretibial edema. No lesions.  Skin: Normal texture and temperature to palpation. No rash noted. No Acanthosis nigricans/skin tags. No lipohypertrophy.  Neuro: MS is good with appropriate affect, pt is alert and Ox3    DM foot exam:    DATA REVIEWED:  Lab Results  Component Value Date   HGBA1C 9.8 (H) 08/02/2018  HGBA1C 8.8 (H) 05/09/2018   HGBA1C 9.0 (H) 01/29/2018   Lab Results  Component Value Date   MICROALBUR 1.3 11/10/2016   LDLCALC 89 01/29/2018   CREATININE 0.96 08/02/2018   Lab Results  Component Value Date   MICRALBCREAT 0.9 11/10/2016    Lab Results  Component Value Date   CHOL 190 08/02/2018   HDL 57.70 08/02/2018   LDLCALC 89 01/29/2018   LDLDIRECT 115.0 08/02/2018   TRIG 205.0 (H) 08/02/2018   CHOLHDL 3 08/02/2018        ASSESSMENT / PLAN / RECOMMENDATIONS:   1) Type *** Diabetes Mellitus, ***controlled, With*** complications - Most recent A1c of *** %. Goal A1c < *** %.  ***  Plan: GENERAL:  ***  MEDICATIONS:  ***  EDUCATION / INSTRUCTIONS:  BG monitoring instructions: Patient is instructed to check her blood sugars *** times a day, ***.  Call [facility] if: BG persistently < 70 or > 300. . I reviewed the Rule of 15 for the treatment of hypoglycemia in detail with the patient. Literature supplied.   2) Diabetic complications:   Eye: Does *** have known diabetic retinopathy. Last eye exam was   Neuro/ Feet: Does *** have known diabetic peripheral neuropathy.  Renal: Patient does *** have known baseline CKD. She is *** on an ACEI/ARB at present.   3) Lipids: Patient is *** on a statin.    4) Hypertension: ***  at goal of < 140/90 mmHg.       Signed electronically by: Mack Guise,  MD  [facility] Endocrinology  [facility] [address] [place], [place] [address] Phone: [phone] FAX: [phone]   CC: [address] [place] [address] Phone: [phone]  Fax: [phone]    Return to Endocrinology clinic as below: Future Appointments  Date Time Provider [place]  09/12/2018  3:20 PM Shamleffer, Melanie Crazier, MD [facility] None  11/12/2018  1:15 PM [facility]  02/04/2019  3:00 PM [name], DO [facility]

## 2018-09-12 ENCOUNTER — Ambulatory Visit: Payer: Medicare HMO | Admitting: Internal Medicine

## 2018-09-12 DIAGNOSIS — Z0289 Encounter for other administrative examinations: Secondary | ICD-10-CM

## 2018-09-25 ENCOUNTER — Encounter: Payer: Self-pay | Admitting: Internal Medicine

## 2018-09-25 ENCOUNTER — Ambulatory Visit (INDEPENDENT_AMBULATORY_CARE_PROVIDER_SITE_OTHER): Payer: Medicare HMO | Admitting: Internal Medicine

## 2018-09-25 VITALS — BP 190/120 | HR 87 | Ht 63.0 in | Wt 207.8 lb

## 2018-09-25 DIAGNOSIS — E1165 Type 2 diabetes mellitus with hyperglycemia: Secondary | ICD-10-CM

## 2018-09-25 MED ORDER — GLIPIZIDE 5 MG PO TABS: 5.0000 mg | ORAL_TABLET | Freq: Two times a day (BID) | ORAL | 3 refills | Status: DC

## 2018-09-25 NOTE — Progress Notes (Signed)
Name: Heather Walker  MRN/ DOB: [id number], [birth year]   Age/ Sex: 57 y.o., female    PCP: Carollee Herter, Alferd Apa, DO   Reason for Endocrinology Evaluation: Type 2 Diabetes Mellitus     Date of Initial Endocrinology Visit: 09/26/2018     PATIENT IDENTIFIER: Heather Walker is a 57 y.o. female with a past medical history of HTN, T2DM, GERD and Dyslipidemia. The patient presented for initial endocrinology clinic visit on 09/26/2018 for consultative assistance with her diabetes management.    HPI: Heather Walker was    Diagnosed with T2DM in 2015 Prior Medications tried/Intolerance: Metformin - GI , Jardiance - due to insurance , farxiga - neck pain  Currently checking blood sugars 1 x / day,  before breakfast  Hypoglycemia episodes : no             Hemoglobin A1c has ranged from 8.5%  in 2016, peaking at 9.8% in 2019. Patient required assistance for hypoglycemia: no Patient has required hospitalization within the last 1 year from hyper or hypoglycemia: no  In terms of diet, the patient rarely drinks sweet teas,  Eats fruits for snacks.    Has not been taking any thing for meds ~ 8 months   Disabled based on arthritis   HOME DIABETES REGIMEN: N/A  Statin: No ACE-I/ARB:Yes Prior Diabetic Education: Yes    METER DOWNLOAD SUMMARY: Did not bring     DIABETIC COMPLICATIONS: Microvascular complications:   Denies: retinopathy , neuropathy  Last eye exam: Completed 12/2017  Macrovascular complications:   Denies: CAD, PVD, CVA   PAST HISTORY: Past Medical History:  Past Medical History:  Diagnosis Date  . Arthritis   . Asthma   . Depression   . Diabetes mellitus without complication ([place])   . Heart murmur   . Hypertension    Past Surgical History:  Past Surgical History:  Procedure Laterality Date  . ABDOMINAL HYSTERECTOMY    . APPENDECTOMY    . BLADDER SUSPENSION    . CESAREAN SECTION     3 previous  . TEE WITHOUT CARDIOVERSION N/A 08/24/2017   Procedure: TRANSESOPHAGEAL ECHOCARDIOGRAM (TEE);  Surgeon: Larey Dresser, MD;  Location: [facility] ENDOSCOPY;  Service: Cardiovascular;  Laterality: N/A;  . TUBAL LIGATION        Social History:  reports that she has never smoked. She has never used smokeless tobacco. She reports that she does not drink alcohol or use drugs. Family History:  Family History  Problem Relation Age of Onset  . Hypertension Mother   . Asthma Mother   . Hypertension Sister   . Asthma Sister   . Diabetes Sister   . Hypertension Maternal Grandmother   . Heart defect Sister   . Asthma Sister   . Aneurysm Sister   . Asthma Sister      HOME MEDICATIONS: Allergies as of 09/25/2018      Reactions   Crestor [rosuvastatin Calcium] Anaphylaxis   Metformin And Related Diarrhea   Nausea and diarrhea   Atorvastatin Other (See Comments)   Neck stiffness   Codeine Itching      Medication List        Accurate as of 09/25/18 11:59 PM. Always use your most recent med list.          albuterol (2.5 MG/3ML) 0.083% nebulizer solution Commonly known as:  PROVENTIL Take 3 mLs (2.5 mg total) by nebulization every 6 (six) hours as needed for wheezing or shortness of breath.  albuterol 108 (90 Base) MCG/ACT inhaler Commonly known as:  PROVENTIL HFA;VENTOLIN HFA Inhale 2 puffs into the lungs every 6 (six) hours as needed. For shortness of breath.   amLODipine 5 MG tablet Commonly known as:  NORVASC Take 1 tablet (5 mg total) by mouth daily.   cephALEXin 500 MG capsule Commonly known as:  KEFLEX Take 1 capsule (500 mg total) by mouth 2 (two) times daily.   cloNIDine 0.2 MG tablet Commonly known as:  CATAPRES Take 1 tablet (0.2 mg total) by mouth 2 (two) times daily.   cyclobenzaprine 5 MG tablet Commonly known as:  FLEXERIL Take 1 tablet (5 mg total) by mouth 3 (three) times daily as needed for muscle spasms.   fenofibrate 160 MG tablet Take 1 tablet (160 mg total) by mouth daily.   glipiZIDE 5 MG  tablet Commonly known as:  GLUCOTROL Take 1 tablet (5 mg total) by mouth 2 (two) times daily before a meal.   glucose blood test strip Use as instructed   losartan 100 MG tablet Commonly known as:  COZAAR Take 100 mg by mouth.   omeprazole 20 MG capsule Commonly known as:  PRILOSEC Take 1 capsule (20 mg total) by mouth daily.   onetouch ultrasoft lancets Use as instructed   Potassium Chloride ER 20 MEQ Tbcr Take 40 mEq by mouth daily.   traMADol 50 MG tablet Commonly known as:  ULTRAM Take 1 tablet (50 mg total) by mouth every 6 (six) hours as needed for moderate pain or severe pain.   traZODone 50 MG tablet Commonly known as:  DESYREL Take 0.5-1 tablets (25-50 mg total) by mouth at bedtime as needed for sleep.   triamterene-hydrochlorothiazide 75-50 MG tablet Commonly known as:  MAXZIDE TAKE 1 TABLET BY MOUTH DAILY        ALLERGIES: Allergies  Allergen Reactions  . Crestor [Rosuvastatin Calcium] Anaphylaxis  . Metformin And Related Diarrhea    Nausea and diarrhea  . Atorvastatin Other (See Comments)    Neck stiffness  . Codeine Itching     REVIEW OF SYSTEMS: A comprehensive ROS was conducted with the patient and is negative except as per HPI and below:  Review of Systems  Constitutional: Positive for malaise/fatigue. Negative for weight loss.  HENT: Negative for congestion and sore throat.   Eyes: Negative for blurred vision and pain.  Respiratory: Negative for cough and shortness of breath.   Cardiovascular: Negative for chest pain and palpitations.  Gastrointestinal: Negative for constipation and heartburn.  Genitourinary: Positive for frequency.  Musculoskeletal: Negative for falls.  Skin: Negative.   Neurological: Negative for tingling and tremors.  Endo/Heme/Allergies: Positive for polydipsia.      OBJECTIVE:   VITAL SIGNS: BP (!) 190/120   Pulse 87   Ht 5\' 3"  (1.6 m)   Wt 207 lb 12.8 oz (94.3 kg)   SpO2 97%   BMI 36.81 kg/m     PHYSICAL EXAM:  General: Pt appears well and is in NAD  Hydration: Well-hydrated with moist mucous membranes and good skin turgor  HEENT: Head: Unremarkable with good dentition. Oropharynx clear without exudate.  Eyes: External eye exam normal without stare, lid lag or exophthalmos.  EOM intact.  PERRL.  Neck: General: Supple without adenopathy or carotid bruits. Thyroid: Thyroid size normal.  No goiter or nodules appreciated. No thyroid bruit.  Lungs: Clear with good BS bilat with no rales, rhonchi, or wheezes  Heart: RRR with normal S1 and S2 and no gallops; no murmurs; no  rub  Abdomen: Normoactive bowel sounds, soft, nontender, without masses or organomegaly palpable  Extremities:  Lower extremities - No pretibial edema. No lesions.  Skin: Normal texture and temperature to palpation. No rash noted. No Acanthosis nigricans/skin tags. No lipohypertrophy.  Neuro: MS is good with appropriate affect, pt is alert and Ox3    DM foot exam:  The skin of the feet is intact without sores or ulcerations. The pedal pulses are 2+ on right and 2+ on left. The sensation is intact to a screening 5.07, 10 gram monofilament bilaterally   DATA REVIEWED:  Lab Results  Component Value Date   HGBA1C 9.8 (H) 08/02/2018   HGBA1C 8.8 (H) 05/09/2018   HGBA1C 9.0 (H) 01/29/2018   Lab Results  Component Value Date   MICROALBUR 1.3 11/10/2016   LDLCALC 89 01/29/2018   CREATININE 0.96 08/02/2018   Lab Results  Component Value Date   MICRALBCREAT 0.9 11/10/2016    Lab Results  Component Value Date   CHOL 190 08/02/2018   HDL 57.70 08/02/2018   LDLCALC 89 01/29/2018   LDLDIRECT 115.0 08/02/2018   TRIG 205.0 (H) 08/02/2018   CHOLHDL 3 08/02/2018        ASSESSMENT / PLAN / RECOMMENDATIONS:   1) Type 2 Diabetes Mellitus, poorly controlled, Without complications - Most recent A1c of  9.8 %. Goal A1c < 7.0 %.   Plan: GENERAL:  Poorly controlled DM due to poor medication adherence and  dietary indiscretions.  I have discussed with the patient the pathophysiology of diabetes. We went over the natural progression of the disease. We talked about both insulin resistance and insulin deficiency. We stressed the importance of lifestyle changes including diet and exercise. I explained the complications associated with diabetes including retinopathy, nephropathy, neuropathy as well as increased risk of cardiovascular disease. We went over the benefit seen with glycemic control.    I explained to the patient that diabetic patients are at higher than normal risk for amputations. The patient was informed that diabetes is the number one cause of non-traumatic amputations in [place].   Discussed the importance of lifestyle changes in diabetes management. Advised pt to avoid sugar-sweetened beverages and snacks.   She is intolerant to a lot of medications in the past, she also has issues with affording medications, pt advised to contact her insurance and obtain a list of formulary meds, as I do not have that .   Will start for SU , we discussed risk of hypoglycemia and weight gain with these, she is not very please about the weight gain but options are limited at this time.   We discussed the importance of checking glucose and bringing this data to the next office visit.   MEDICATIONS:  Glipizide 5 mg BID   EDUCATION / INSTRUCTIONS:  BG monitoring instructions: Patient is instructed to check her blood sugars 2 times a day, fasting and bedtime.  Call [place] [facility] clinic if: BG persistently < 70 or > 300. . I reviewed the Rule of 15 for the treatment of hypoglycemia in detail with the patient. Literature supplied.   2) Diabetic complications:   Eye: Does not have known diabetic retinopathy.   Neuro/ Feet: Does not have known diabetic peripheral neuropathy.  Renal: Patient does not have known baseline CKD. She is on an ACEI/ARB at present.   3) Lipids: Patient is  intolerant to statins.    4) Hypertension: She is not at goal of < 140/90 mmHg. Pt non-compliant with her medications,  she has not taken her meds in a while. Discussed her risk of stroke and CAD. Pt urged to start antihypertensive meds ASAP.    F/u in 3 weeks with glucose data.   Signed electronically by: Mack Guise, MD  [facility] Endocrinology  [facility] [address] [place], [place] [address] Phone: [phone] FAX: [phone]   CC: [address] [place] [address] Phone: [phone]  Fax: [phone]    Return to Endocrinology clinic as below: Future Appointments  Date Time Provider [place]  10/24/2018  3:40 PM Shamleffer, Melanie Crazier, MD [facility] None  11/12/2018  1:15 PM [facility]  02/04/2019  3:00 PM [name], DO [facility]

## 2018-09-25 NOTE — Patient Instructions (Addendum)
-   Check sugar twice a day (fasting and bedtime) - Start Glipizide 5 mg twice a day with meals, If you are not going to eat a meal, DO NOT take Glipizide - Bring meter on next visit  - Choose healthy, lower carb lower calorie snacks: toss salad, cooked vegetables, cottage cheese, peanut butter, low fat cheese / string cheese, lower sodium deli meat, tuna salad or chicken salad

## 2018-10-24 ENCOUNTER — Ambulatory Visit: Payer: Medicare HMO | Admitting: Internal Medicine

## 2018-10-25 ENCOUNTER — Ambulatory Visit: Payer: Medicare HMO | Admitting: Internal Medicine

## 2018-11-12 ENCOUNTER — Other Ambulatory Visit: Payer: Medicare HMO

## 2018-12-17 ENCOUNTER — Other Ambulatory Visit: Payer: Self-pay | Admitting: Family Medicine

## 2018-12-17 DIAGNOSIS — M62838 Other muscle spasm: Secondary | ICD-10-CM

## 2018-12-17 DIAGNOSIS — M25511 Pain in right shoulder: Secondary | ICD-10-CM

## 2018-12-17 DIAGNOSIS — E1122 Type 2 diabetes mellitus with diabetic chronic kidney disease: Secondary | ICD-10-CM

## 2018-12-17 DIAGNOSIS — G4700 Insomnia, unspecified: Secondary | ICD-10-CM

## 2018-12-17 DIAGNOSIS — I10 Essential (primary) hypertension: Secondary | ICD-10-CM

## 2018-12-17 DIAGNOSIS — IMO0001 Reserved for inherently not codable concepts without codable children: Secondary | ICD-10-CM

## 2018-12-17 DIAGNOSIS — G8929 Other chronic pain: Secondary | ICD-10-CM

## 2018-12-17 DIAGNOSIS — I129 Hypertensive chronic kidney disease with stage 1 through stage 4 chronic kidney disease, or unspecified chronic kidney disease: Secondary | ICD-10-CM

## 2018-12-17 NOTE — Telephone Encounter (Addendum)
Requesting: Tramadol  Contract: Yes-  UDS: Yes--08/02/2018, low risk Last OV: 08/23/2018 Next OV: 02/04/2019 Last Refill: 08/02/2018, #30- 0 RF Database:   Please advise Epic gave me a hard stop for Trazadone and Tramadol and couldn't send refills.

## 2019-02-04 ENCOUNTER — Encounter: Payer: Medicare HMO | Admitting: Family Medicine

## 2019-02-11 ENCOUNTER — Ambulatory Visit (INDEPENDENT_AMBULATORY_CARE_PROVIDER_SITE_OTHER): Payer: Medicare HMO | Admitting: Family Medicine

## 2019-02-11 ENCOUNTER — Encounter: Payer: Self-pay | Admitting: Family Medicine

## 2019-02-11 ENCOUNTER — Other Ambulatory Visit: Payer: Self-pay

## 2019-02-11 DIAGNOSIS — E1122 Type 2 diabetes mellitus with diabetic chronic kidney disease: Secondary | ICD-10-CM

## 2019-02-11 DIAGNOSIS — M25511 Pain in right shoulder: Secondary | ICD-10-CM

## 2019-02-11 DIAGNOSIS — G4700 Insomnia, unspecified: Secondary | ICD-10-CM

## 2019-02-11 DIAGNOSIS — G8929 Other chronic pain: Secondary | ICD-10-CM

## 2019-02-11 DIAGNOSIS — K219 Gastro-esophageal reflux disease without esophagitis: Secondary | ICD-10-CM

## 2019-02-11 DIAGNOSIS — I10 Essential (primary) hypertension: Secondary | ICD-10-CM | POA: Diagnosis not present

## 2019-02-11 DIAGNOSIS — IMO0001 Reserved for inherently not codable concepts without codable children: Secondary | ICD-10-CM

## 2019-02-11 DIAGNOSIS — I129 Hypertensive chronic kidney disease with stage 1 through stage 4 chronic kidney disease, or unspecified chronic kidney disease: Secondary | ICD-10-CM

## 2019-02-11 DIAGNOSIS — E781 Pure hyperglyceridemia: Secondary | ICD-10-CM

## 2019-02-11 DIAGNOSIS — M62838 Other muscle spasm: Secondary | ICD-10-CM | POA: Diagnosis not present

## 2019-02-11 MED ORDER — FENOFIBRATE 160 MG PO TABS: 160.0000 mg | ORAL_TABLET | Freq: Every day | ORAL | 1 refills | Status: DC

## 2019-02-11 MED ORDER — CLONIDINE HCL 0.2 MG PO TABS: ORAL_TABLET | ORAL | 1 refills | Status: DC

## 2019-02-11 MED ORDER — OMEPRAZOLE 20 MG PO CPDR: 20.0000 mg | DELAYED_RELEASE_CAPSULE | Freq: Every day | ORAL | 3 refills | Status: DC

## 2019-02-11 MED ORDER — CYCLOBENZAPRINE HCL 5 MG PO TABS: ORAL_TABLET | ORAL | 0 refills | Status: DC

## 2019-02-11 MED ORDER — TRAZODONE HCL 50 MG PO TABS: 25.0000 mg | ORAL_TABLET | Freq: Every evening | ORAL | 1 refills | Status: DC | PRN

## 2019-02-11 MED ORDER — AMLODIPINE BESYLATE 5 MG PO TABS: 5.0000 mg | ORAL_TABLET | Freq: Every day | ORAL | 3 refills | Status: DC

## 2019-02-11 MED ORDER — TRAMADOL HCL 50 MG PO TABS: ORAL_TABLET | ORAL | 0 refills | Status: DC

## 2019-02-11 MED ORDER — TRIAMTERENE-HCTZ 75-50 MG PO TABS: 1.0000 | ORAL_TABLET | Freq: Every day | ORAL | 1 refills | Status: DC

## 2019-02-11 MED ORDER — POTASSIUM CHLORIDE ER 20 MEQ PO TBCR: 40.0000 meq | EXTENDED_RELEASE_TABLET | Freq: Every day | ORAL | 2 refills | Status: DC

## 2019-02-11 NOTE — Progress Notes (Signed)
Virtual Visit via Video Note  I connected with Heather Walker on 02/11/19 at  9:00 AM EDT by a video enabled telemedicine application and verified that I am speaking with the correct person using two identifiers.   I discussed the limitations of evaluation and management by telemedicine and the availability of in person appointments. The patient expressed understanding and agreed to proceed.  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 Arthritis   . Asthma   . Depression   . Diabetes mellitus without complication ([place])   . Heart murmur   . Hypertension    Outpatient Encounter Medications as of 02/11/2019  Medication Sig  . albuterol (PROVENTIL HFA;VENTOLIN HFA) 108 (90 Base) MCG/ACT inhaler Inhale 2 puffs into the lungs every 6 (six) hours as needed. For shortness of breath.  Marland Kitchen albuterol (PROVENTIL) (2.5 MG/3ML) 0.083% nebulizer solution Take 3 mLs (2.5 mg total) by nebulization every 6 (six) hours as needed for wheezing or shortness of breath.  Marland Kitchen amLODipine (NORVASC) 5 MG tablet Take 1 tablet (5 mg total) by mouth daily.  . cephALEXin (KEFLEX) 500 MG capsule Take 1 capsule (500 mg total) by mouth 2 (two) times daily. (Patient not taking: Reported on 08/23/2018)  . cloNIDine (CATAPRES) 0.2 MG tablet TAKE 1 TABLET(0.2 MG) BY MOUTH TWICE DAILY  . cyclobenzaprine (FLEXERIL) 5 MG tablet TAKE 1 TABLET(5 MG) BY MOUTH THREE TIMES DAILY  AS NEEDED FOR MUSCLE SPASMS  . fenofibrate 160 MG tablet Take 1 tablet (160 mg total) by mouth daily.  Marland Kitchen glipiZIDE (GLUCOTROL) 5 MG tablet Take 1 tablet (5 mg total) by mouth 2 (two) times daily before a meal.  . glucose blood test strip Use as instructed (Patient taking differently: 1 each by Other route every morning. Use as instructed)  . Lancets (ONETOUCH ULTRASOFT) lancets Use as instructed (Patient taking differently: by Other route every morning. Use as instructed)  . losartan (COZAAR) 100 MG tablet Take 100 mg by mouth.  Marland Kitchen omeprazole (PRILOSEC) 20 MG capsule Take 1 capsule (20 mg total) by mouth daily.  . Potassium Chloride ER 20 MEQ TBCR Take 40 mEq by mouth daily.  . traMADol (ULTRAM) 50 MG tablet TAKE 1 TABLET(50 MG) BY MOUTH EVERY 6 HOURS AS NEEDED FOR MODERATE PAIN OR SEVERE PAIN  . traZODone (DESYREL) 50 MG tablet Take 0.5-1 tablets (25-50 mg total) by mouth at bedtime as needed. for sleep  . triamterene-hydrochlorothiazide (MAXZIDE) 75-50 MG tablet Take 1 tablet by mouth daily.  . [DISCONTINUED] amLODipine (NORVASC) 5 MG tablet Take 1 tablet (5 mg total) by mouth daily.  . [DISCONTINUED] cloNIDine (CATAPRES) 0.2 MG tablet TAKE 1 TABLET(0.2 MG) BY MOUTH TWICE DAILY  . [DISCONTINUED] cyclobenzaprine (FLEXERIL) 5 MG tablet TAKE 1 TABLET(5 MG) BY MOUTH THREE TIMES DAILY AS NEEDED FOR MUSCLE SPASMS  . [  DISCONTINUED] fenofibrate 160 MG tablet Take 1 tablet (160 mg total) by mouth daily.  . [DISCONTINUED] omeprazole (PRILOSEC) 20 MG capsule Take 1 capsule (20 mg total) by mouth daily.  . [DISCONTINUED] Potassium Chloride ER 20 MEQ TBCR Take 40 mEq by mouth daily. (Patient not taking: Reported on 09/25/2018)  . [DISCONTINUED] traMADol (ULTRAM) 50 MG tablet TAKE 1 TABLET(50 MG) BY MOUTH EVERY 6 HOURS AS NEEDED FOR MODERATE PAIN OR SEVERE PAIN  . [DISCONTINUED] traZODone (DESYREL) 50 MG tablet TAKE 1/2 TO 1 TABLET BY MOUTH AT BEDTIME AS NEEDED FOR SLEEP  . [DISCONTINUED]  triamterene-hydrochlorothiazide (MAXZIDE) 75-50 MG tablet TAKE 1 TABLET BY MOUTH DAILY   No facility-administered encounter medications on file as of 02/11/2019.    Observations/Objective:  bp running good per pt -- pt states sys was 118 today but she did not write it down  Afebrile  Pt is in NAD Neatly dressed, happy Assessment and Plan: 1. Essential hypertension .Well controlled, no changes to meds. Encouraged heart healthy diet such as the DASH diet and exercise as tolerated.   - Lipid panel; Future - Comprehensive metabolic panel; Future - triamterene-hydrochlorothiazide (MAXZIDE) 75-50 MG tablet; Take 1 tablet by mouth daily.  Dispense: 90 tablet; Refill: 1 - amLODipine (NORVASC) 5 MG tablet; Take 1 tablet (5 mg total) by mouth daily.  Dispense: 30 tablet; Refill: 3 - cloNIDine (CATAPRES) 0.2 MG tablet; TAKE 1 TABLET(0.2 MG) BY MOUTH TWICE DAILY  Dispense: 180 tablet; Refill: 1 - Potassium Chloride ER 20 MEQ TBCR; Take 40 mEq by mouth daily.  Dispense: 60 tablet; Refill: 2  2. Type 2 DM with CKD and hypertension (HCC) hgba1c per endo, minimize simple carbs. Increase exercise as tolerated. Continue current meds  - Hemoglobin A1c; Future - Lipid panel; Future - Comprehensive metabolic panel; Future - cloNIDine (CATAPRES) 0.2 MG tablet; TAKE 1 TABLET(0.2 MG) BY MOUTH TWICE DAILY  Dispense: 180 tablet; Refill: 1  3. Hypertriglyceridemia Encouraged heart healthy diet, increase exercise, avoid trans fats, consider a krill oil cap daily  - Lipid panel; Future - fenofibrate 160 MG tablet; Take 1 tablet (160 mg total) by mouth daily.  Dispense: 90 tablet; Refill: 1  4. Gastroesophageal reflux disease, esophagitis presence not specified Stable -- refill meds  - omeprazole (PRILOSEC) 20 MG capsule; Take 1 capsule (20 mg total) by mouth daily.  Dispense: 90 capsule; Refill: 3  5. Chronic right shoulder pain Stable  - traMADol (ULTRAM) 50 MG tablet; TAKE 1 TABLET(50 MG) BY MOUTH EVERY  6 HOURS AS NEEDED FOR MODERATE PAIN OR SEVERE PAIN  Dispense: 30 tablet; Refill: 0  6. Insomnia   - traZODone (DESYREL) 50 MG tablet; Take 0.5-1 tablets (25-50 mg total) by mouth at bedtime as needed. for sleep  Dispense: 30 tablet; Refill: 1  7. Muscle spasm   - cyclobenzaprine (FLEXERIL) 5 MG tablet; TAKE 1 TABLET(5 MG) BY MOUTH THREE TIMES DAILY AS NEEDED FOR MUSCLE SPASMS  Dispense: 30 tablet; Refill: 0   Follow Up Instructions:    I discussed the assessment and treatment plan with the patient. The patient was provided an opportunity to ask questions and all were answered. The patient agreed with the plan and demonstrated an understanding of the instructions.   The patient was advised to call back or seek an in-person evaluation if the symptoms worsen or if the condition fails to improve as anticipated.  I provided 30 minutes of non-face-to-face time during this encounter.   [name], DO

## 2019-02-20 ENCOUNTER — Other Ambulatory Visit (INDEPENDENT_AMBULATORY_CARE_PROVIDER_SITE_OTHER): Payer: Medicare HMO

## 2019-02-20 ENCOUNTER — Telehealth: Payer: Self-pay | Admitting: Family Medicine

## 2019-02-20 ENCOUNTER — Other Ambulatory Visit: Payer: Self-pay

## 2019-02-20 ENCOUNTER — Other Ambulatory Visit: Payer: Medicare HMO

## 2019-02-20 DIAGNOSIS — E1122 Type 2 diabetes mellitus with diabetic chronic kidney disease: Secondary | ICD-10-CM

## 2019-02-20 DIAGNOSIS — I129 Hypertensive chronic kidney disease with stage 1 through stage 4 chronic kidney disease, or unspecified chronic kidney disease: Secondary | ICD-10-CM

## 2019-02-20 DIAGNOSIS — I10 Essential (primary) hypertension: Secondary | ICD-10-CM | POA: Diagnosis not present

## 2019-02-20 DIAGNOSIS — E781 Pure hyperglyceridemia: Secondary | ICD-10-CM

## 2019-02-20 DIAGNOSIS — IMO0001 Reserved for inherently not codable concepts without codable children: Secondary | ICD-10-CM

## 2019-02-20 LAB — COMPREHENSIVE METABOLIC PANEL
ALT: 15 U/L (ref 0–35)
AST: 15 U/L (ref 0–37)
Albumin: 4.1 g/dL (ref 3.5–5.2)
Alkaline Phosphatase: 91 U/L (ref 39–117)
BUN: 22 mg/dL (ref 6–23)
CO2: 30 mEq/L (ref 19–32)
Calcium: 8.6 mg/dL (ref 8.4–10.5)
Chloride: 100 mEq/L (ref 96–112)
Creatinine, Ser: 1.34 mg/dL — ABNORMAL HIGH (ref 0.40–1.20)
GFR: 49.45 mL/min — ABNORMAL LOW (ref >60.00)
Glucose, Bld: 269 mg/dL — ABNORMAL HIGH (ref 70–99)
Potassium: 4 mEq/L (ref 3.5–5.1)
Sodium: 139 mEq/L (ref 135–145)
Total Bilirubin: 0.4 mg/dL (ref 0.2–1.2)
Total Protein: 7.4 g/dL (ref 6.0–8.3)

## 2019-02-20 LAB — LIPID PANEL
Cholesterol: 173 mg/dL (ref 0–200)
HDL: 51.9 mg/dL (ref >39.00)
LDL Cholesterol: 87 mg/dL (ref 0–99)
NonHDL: 121.12
Total CHOL/HDL Ratio: 3
Triglycerides: 172 mg/dL — ABNORMAL HIGH (ref 0.0–149.0)
VLDL: 34.4 mg/dL (ref 0.0–40.0)

## 2019-02-20 NOTE — Telephone Encounter (Signed)
Pt came in office stating wanting to verify with provider or CMA what is here disability so that she can inform Housing purpose. Pt dropped off Handicap Document to be filled out by provider ( 2 pages- Handicap Parking Placard) pt would like to be called at tel [phone] when document ready to pick up. Document put at front office tray under providers name.

## 2019-02-20 NOTE — Telephone Encounter (Signed)
Please advise pt about her disability since she does have to inform Housing.

## 2019-02-20 NOTE — Progress Notes (Unsigned)
a1 

## 2019-02-21 LAB — HEMOGLOBIN A1C
Hgb A1c MFr Bld: 11.5 % of total Hgb — ABNORMAL HIGH (ref <5.7)
Mean Plasma Glucose: 283 (calc)
eAG (mmol/L): 15.7 (calc)

## 2019-02-25 ENCOUNTER — Other Ambulatory Visit: Payer: Self-pay | Admitting: Family Medicine

## 2019-02-25 DIAGNOSIS — E1165 Type 2 diabetes mellitus with hyperglycemia: Secondary | ICD-10-CM

## 2019-02-25 DIAGNOSIS — E1169 Type 2 diabetes mellitus with other specified complication: Secondary | ICD-10-CM

## 2019-02-25 DIAGNOSIS — E785 Hyperlipidemia, unspecified: Secondary | ICD-10-CM

## 2019-02-25 DIAGNOSIS — R7989 Other specified abnormal findings of blood chemistry: Secondary | ICD-10-CM

## 2019-02-25 MED ORDER — GLIPIZIDE 5 MG PO TABS: 5.0000 mg | ORAL_TABLET | Freq: Two times a day (BID) | ORAL | 3 refills | Status: DC

## 2019-02-26 NOTE — Telephone Encounter (Signed)
Completed Physician section on Disability Parking Placard Application; forwarded to provider/SLS 05/12

## 2019-03-04 ENCOUNTER — Telehealth: Payer: Self-pay | Admitting: *Deleted

## 2019-03-04 NOTE — Telephone Encounter (Signed)
Patient notified that handicap placard is ready for pickup

## 2019-03-19 ENCOUNTER — Ambulatory Visit: Payer: Medicare HMO | Admitting: Internal Medicine

## 2019-03-26 ENCOUNTER — Ambulatory Visit (INDEPENDENT_AMBULATORY_CARE_PROVIDER_SITE_OTHER): Payer: Medicare HMO | Admitting: Internal Medicine

## 2019-03-26 ENCOUNTER — Other Ambulatory Visit: Payer: Self-pay

## 2019-03-26 ENCOUNTER — Encounter: Payer: Self-pay | Admitting: Internal Medicine

## 2019-03-26 VITALS — BP 136/88 | HR 72 | Temp 98.2°F | Ht 63.0 in | Wt 209.4 lb

## 2019-03-26 DIAGNOSIS — Z9189 Other specified personal risk factors, not elsewhere classified: Secondary | ICD-10-CM

## 2019-03-26 DIAGNOSIS — N289 Disorder of kidney and ureter, unspecified: Secondary | ICD-10-CM | POA: Diagnosis not present

## 2019-03-26 DIAGNOSIS — E1165 Type 2 diabetes mellitus with hyperglycemia: Secondary | ICD-10-CM | POA: Diagnosis not present

## 2019-03-26 LAB — GLUCOSE, POCT (MANUAL RESULT ENTRY): POC Glucose: 256 mg/dl — AB (ref 70–99)

## 2019-03-26 MED ORDER — INSULIN PEN NEEDLE 32G X 4 MM MISC: 11 refills | Status: DC

## 2019-03-26 MED ORDER — INSULIN ASPART PROT & ASPART (70-30 MIX) 100 UNIT/ML PEN: 16.0000 [IU] | PEN_INJECTOR | Freq: Two times a day (BID) | SUBCUTANEOUS | 11 refills | Status: DC

## 2019-03-26 NOTE — Patient Instructions (Signed)
-   Stop Glipizide  - Start Novolog Mix 16 units with Breakfast and 16 units with Supper   - check sugar twice a day (before breakfast and supper)   - HOW TO TREAT LOW BLOOD SUGARS (Blood sugar LESS THAN 70 MG/DL)  Please follow the RULE OF 15 for the treatment of hypoglycemia treatment (when your (blood sugars are less than 70 mg/dL)    STEP 1: Take 15 grams of carbohydrates when your blood sugar is low, which includes:   3-4 GLUCOSE TABS  OR  3-4 OZ OF JUICE OR REGULAR SODA OR  ONE TUBE OF GLUCOSE GEL     STEP 2: RECHECK blood sugar in 15 MINUTES STEP 3: If your blood sugar is still low at the 15 minute recheck --> then, go back to STEP 1 and treat AGAIN with another 15 grams of carbohydrates.

## 2019-03-26 NOTE — Progress Notes (Signed)
Name: Heather Walker  Age/ Sex: 58 y.o., female   MRN/ DOB: [id number], [birth year]     PCP: [name], DO   Reason for Endocrinology Evaluation: Type 2 Diabetes Mellitus  Initial Endocrine Consultative Visit: 09/26/2018    PATIENT IDENTIFIER: Heather Walker is a 58 y.o. female with a past medical history of HTN, T2DM, GERD and Dyslipidemia . The patient has followed with Endocrinology clinic since 09/26/2018 for consultative assistance with management of her diabetes.  DIABETIC HISTORY:  [name] was diagnosed with T2DM in 2015. She has reported GI intolerance to Metformin. She was unable to try Jardiance due to insurance issues. Wilder Glade gave her neck pain. Her hemoglobin A1c has ranged from 8.5%  in 2016, peaking at 9.8% in 2019.   On her initial visit to our clinic her A1c 9.8%. She was not taking any of her prescribed medications.    SUBJECTIVE:   During the last visit (09/25/2018): A1c was 9.8%. Started glipizide 5 mg BID   Today (03/26/2019): [name] is here for a follow up on diabetes management.She missed her 3 week follow up appointment in December, 2019.  She checks her blood sugars 1 times daily, but somehow lost her meter for a while, found it last week but forgot to being it today.The patient has not had hypoglycemic episodes since the last clinic visit. Otherwise, the patient has not required any recent emergency interventions for hypoglycemia and has not had recent hospitalizations secondary to hyper or hypoglycemic episodes.    ROS: As per HPI and as detailed below: Review of Systems  HENT: Negative for congestion and sore throat.   Respiratory: Negative for cough and shortness of breath.   Cardiovascular: Negative for chest pain and palpitations.  Gastrointestinal: Negative for diarrhea and nausea.  Genitourinary: Negative for frequency.  Endo/Heme/Allergies: Negative for polydipsia.      HOME DIABETES REGIMEN:  Glipizide 5 mg BID      METER DOWNLOAD SUMMARY: Did not bring    DIABETIC COMPLICATIONS: Microvascular complications:   Denies: retinopathy , neuropathy  Last eye exam: Completed 12/2017  Macrovascular complications:   Denies: CAD, PVD, CVA  HISTORY:  Past Medical History:  Past Medical History:  Diagnosis Date  . Arthritis   . Asthma   . Depression   . Diabetes mellitus without complication ([place])   . Heart murmur   . Hypertension    Past Surgical History:  Past Surgical History:  Procedure Laterality Date  . ABDOMINAL HYSTERECTOMY    . APPENDECTOMY    . BLADDER SUSPENSION    . CESAREAN SECTION     3 previous  . TEE WITHOUT CARDIOVERSION N/A 08/24/2017   Procedure: TRANSESOPHAGEAL ECHOCARDIOGRAM (TEE);  Surgeon: Larey Dresser, MD;  Location[facility] ENDOSCOPY;  Service: Cardiovascular;  Laterality: N/A;  . TUBAL LIGATION      Social History:  reports that she has never smoked. She has never used smokeless tobacco. She reports that she does not drink alcohol or use drugs. Family History:  Family History  Problem Relation Age of Onset  . Hypertension Mother   . Asthma Mother   . Hypertension Sister   . Asthma Sister   . Diabetes Sister   . Hypertension Maternal Grandmother   . Heart defect Sister   . Asthma Sister   . Aneurysm Sister   . Asthma Sister      HOME MEDICATIONS: Allergies as of 03/26/2019      Reactions   Crestor [  rosuvastatin Calcium] Anaphylaxis   Metformin And Related Diarrhea   Nausea and diarrhea   Atorvastatin Other (See Comments)   Neck stiffness   Codeine Itching      Medication List       Accurate as of March 26, 2019  2:02 PM. If you have any questions, ask your nurse or doctor.        STOP taking these medications   glipiZIDE 5 MG tablet Commonly known as:  GLUCOTROL Stopped by:  Dorita Sciara, MD     TAKE these medications   albuterol (2.5 MG/3ML) 0.083% nebulizer solution Commonly known as:  PROVENTIL Take 3 mLs (2.5 mg total) by  nebulization every 6 (six) hours as needed for wheezing or shortness of breath.   albuterol 108 (90 Base) MCG/ACT inhaler Commonly known as:  VENTOLIN HFA Inhale 2 puffs into the lungs every 6 (six) hours as needed. For shortness of breath.   amLODipine 5 MG tablet Commonly known as:  NORVASC Take 1 tablet (5 mg total) by mouth daily.   cephALEXin 500 MG capsule Commonly known as:  KEFLEX Take 1 capsule (500 mg total) by mouth 2 (two) times daily.   cloNIDine 0.2 MG tablet Commonly known as:  CATAPRES TAKE 1 TABLET(0.2 MG) BY MOUTH TWICE DAILY   cyclobenzaprine 5 MG tablet Commonly known as:  FLEXERIL TAKE 1 TABLET(5 MG) BY MOUTH THREE TIMES DAILY AS NEEDED FOR MUSCLE SPASMS   fenofibrate 160 MG tablet Take 1 tablet (160 mg total) by mouth daily.   glucose blood test strip Use as instructed What changed:    how much to take  how to take this  when to take this   insulin aspart protamine - aspart (70-30) 100 UNIT/ML FlexPen Commonly known as:  NovoLOG Mix 70/30 FlexPen Inject 0.16 mLs (16 Units total) into the skin 2 (two) times daily. Started by:  Dorita Sciara, MD   Insulin Pen Needle 32G X 4 MM Misc 2x daily Started by:  Dorita Sciara, MD   losartan 100 MG tablet Commonly known as:  COZAAR Take 100 mg by mouth.   omeprazole 20 MG capsule Commonly known as:  PRILOSEC Take 1 capsule (20 mg total) by mouth daily.   onetouch ultrasoft lancets Use as instructed What changed:    how to take this  when to take this   Potassium Chloride ER 20 MEQ Tbcr Take 40 mEq by mouth daily.   traMADol 50 MG tablet Commonly known as:  ULTRAM TAKE 1 TABLET(50 MG) BY MOUTH EVERY 6 HOURS AS NEEDED FOR MODERATE PAIN OR SEVERE PAIN   traZODone 50 MG tablet Commonly known as:  DESYREL Take 0.5-1 tablets (25-50 mg total) by mouth at bedtime as needed. for sleep   triamterene-hydrochlorothiazide 75-50 MG tablet Commonly known as:  MAXZIDE Take 1 tablet by  mouth daily.        OBJECTIVE:   Vital Signs: BP 136/88 (BP Location: Left Arm, Patient Position: Sitting, Cuff Size: Large)   Pulse 72   Temp 98.2 F (36.8 C)   Ht 5\' 3"  (1.6 m)   Wt 209 lb 6.4 oz (95 kg)   SpO2 98%   BMI 37.09 kg/m   Wt Readings from Last 3 Encounters:  03/26/19 209 lb 6.4 oz (95 kg)  09/25/18 207 lb 12.8 oz (94.3 kg)  08/23/18 210 lb (95.3 kg)     Exam: General: Pt appears well and is in NAD  Lungs: Clear with good BS  bilat with no rales, rhonchi, or wheezes  Heart: RRR with normal S1 and S2 and no gallops; no murmurs; no rub  Abdomen: Normoactive bowel sounds, soft, nontender, without masses or organomegaly palpable  Extremities: No pretibial edema. No tremor. Normal strength and motion throughout. See detailed diabetic foot exam below.  Skin: Normal texture and temperature to palpation. No rash noted. No Acanthosis nigricans/skin tags. No lipohypertrophy.  Neuro: MS is good with appropriate affect, pt is alert and Ox3    DM foot exam: 03/26/2019 The skin of the feet is intact without sores or ulcerations. The pedal pulses are 2+ on right and 2+ on left. The sensation is intact to a screening 5.07, 10 gram monofilament bilaterally            DATA REVIEWED:  Lab Results  Component Value Date   HGBA1C 11.5 (H) 02/20/2019   HGBA1C 9.8 (H) 08/02/2018   HGBA1C 8.8 (H) 05/09/2018   Lab Results  Component Value Date   MICROALBUR 1.3 11/10/2016   LDLCALC 87 02/20/2019   CREATININE 1.34 (H) 02/20/2019   Lab Results  Component Value Date   MICRALBCREAT 0.9 11/10/2016     Lab Results  Component Value Date   CHOL 173 02/20/2019   HDL 51.90 02/20/2019   LDLCALC 87 02/20/2019   LDLDIRECT 115.0 08/02/2018   TRIG 172.0 (H) 02/20/2019   CHOLHDL 3 02/20/2019       In-office Bg 256 mg/dL (fasting )  ASSESSMENT / PLAN / RECOMMENDATIONS:   1)Type 2 Diabetes Mellitus, poorly controlled, Without complications - Most recent A1c of  11.5 %. Goal  A1c < 7.0 %.    Plan: - Poorly controlled diabetes is due to medication non-adherence and dietary indiscretions. She continues to drink sweet tea and regular sprite. She eats one meal a day and snacks. Today at 3 AM she wanted something sweet and ate an oatmeal cake.   - I have discussed with the patient the pathophysiology of diabetes. We went over the natural progression of the disease. We talked about both insulin resistance and insulin deficiency. We stressed the importance of lifestyle changes including diet and exercise. I explained the complications associated with diabetes including retinopathy, nephropathy, neuropathy as well as increased risk of cardiovascular disease. We went over the benefit seen with glycemic control.   - I explained to the patient that diabetic patients are at higher than normal risk for amputations. - We reviewed the down trend of her GFR over the past year and currently at 25  - I explained to her with an A1c of 11.5 and a low GFR, the safest thing for her is insulin at this time. She was shows how to use an insulin pen, I will start her on an insulin mix, as I know she is not going to be able to keep up with the 4 injections a day  MEDICATIONS:  Stop Glipizide   Start Novolog Mix (70/30) 16 units BID with meals   EDUCATION / INSTRUCTIONS:  BG monitoring instructions: Patient is instructed to check her blood sugars 2 times a day, fasting and supper time.  Call [facility] if: BG persistently < 70 or > 300. . I reviewed the Rule of 15 for the treatment of hypoglycemia in detail with the patient. Literature supplied.  REFERRALS:  Declined CDE referral   2) Diabetic complications:   Eye: Does not have known diabetic retinopathy.   Neuro/ Feet: Does not have known diabetic peripheral neuropathy .  Renal: Patient does not have known baseline CKD, but has a low GFR for the first time , will continue to monitor. She   is  on an ACEI/ARB  at present.    3) Lipids: Patient is intolerant to statin. Discussed cardiovascular benefits of statin. Will discuss further on future visits  4) Hypertension: She is at goal of < 140/90 mmHg.    F/U in 6 weeks     Signed electronically by: Mack Guise, MD  [facility]  [facility] [address] [place], [place] [address] Phone: [phone] FAX: [phone]   CC: [address] [place] [address] Phone: [phone]  Fax: [phone]  Return to Endocrinology clinic as below: Future Appointments  Date Time Provider [place]  05/08/2019  3:00 PM Shamleffer, Melanie Crazier, MD [facility] None  07/22/2019  1:00 PM [name], DO [facility]

## 2019-03-28 ENCOUNTER — Telehealth: Payer: Self-pay | Admitting: *Deleted

## 2019-03-28 NOTE — Telephone Encounter (Signed)
Spoke with pt and advised her that we could mail placard to her and she is agreeable. Address verified and form mailed to pt.  Copied from [place] [phone]. Topic: General - Other >> Mar 27, 2019 10:43 AM Erick Blinks wrote: Reason for CRM: Pt plans to come by the office to pick up documents that she left. Disability Parking Placard Application.

## 2019-03-29 ENCOUNTER — Telehealth: Payer: Self-pay | Admitting: Internal Medicine

## 2019-03-29 NOTE — Telephone Encounter (Signed)
Patient requests to be called re: the following question: When patient takes insulin, should she take it before or after she eats. Please call patient at ph# [phone].

## 2019-03-29 NOTE — Telephone Encounter (Signed)
Spoke to pt and clarified instructions

## 2019-04-15 ENCOUNTER — Telehealth: Payer: Self-pay

## 2019-04-15 DIAGNOSIS — E119 Type 2 diabetes mellitus without complications: Secondary | ICD-10-CM | POA: Diagnosis not present

## 2019-04-15 DIAGNOSIS — H11159 Pinguecula, unspecified eye: Secondary | ICD-10-CM | POA: Diagnosis not present

## 2019-04-15 DIAGNOSIS — H5213 Myopia, bilateral: Secondary | ICD-10-CM | POA: Diagnosis not present

## 2019-04-15 DIAGNOSIS — Z794 Long term (current) use of insulin: Secondary | ICD-10-CM | POA: Diagnosis not present

## 2019-04-15 DIAGNOSIS — H18419 Arcus senilis, unspecified eye: Secondary | ICD-10-CM | POA: Diagnosis not present

## 2019-04-15 DIAGNOSIS — I10 Essential (primary) hypertension: Secondary | ICD-10-CM | POA: Diagnosis not present

## 2019-04-15 DIAGNOSIS — H5203 Hypermetropia, bilateral: Secondary | ICD-10-CM | POA: Diagnosis not present

## 2019-04-15 DIAGNOSIS — H524 Presbyopia: Secondary | ICD-10-CM | POA: Diagnosis not present

## 2019-04-15 DIAGNOSIS — H52223 Regular astigmatism, bilateral: Secondary | ICD-10-CM | POA: Diagnosis not present

## 2019-04-15 LAB — HM DIABETES EYE EXAM

## 2019-04-15 NOTE — Telephone Encounter (Signed)
Copied from [place] [phone]. Topic: General - Other >> Apr 11, 2019  4:06 PM Rainey Pines A wrote: Patient would like callback in regards to the paperwork for her handicap sticker that was mailed on 03/28/2019. Patient stated she never received it. I also verified mailing address.

## 2019-04-22 ENCOUNTER — Telehealth: Payer: Self-pay | Admitting: Family Medicine

## 2019-04-22 ENCOUNTER — Encounter: Payer: Medicare HMO | Admitting: Family Medicine

## 2019-04-22 DIAGNOSIS — IMO0002 Reserved for concepts with insufficient information to code with codable children: Secondary | ICD-10-CM

## 2019-04-22 DIAGNOSIS — E1151 Type 2 diabetes mellitus with diabetic peripheral angiopathy without gangrene: Secondary | ICD-10-CM

## 2019-04-22 MED ORDER — GLUCOSE BLOOD VI STRP: ORAL_STRIP | 12 refills | Status: DC

## 2019-04-22 MED ORDER — ONETOUCH ULTRASOFT LANCETS MISC: 12 refills | Status: DC

## 2019-04-22 NOTE — Telephone Encounter (Signed)
Sent medications in needed by patient

## 2019-04-22 NOTE — Telephone Encounter (Signed)
You will need to go through her list with her and confirm which ones she is requesting and then we can help. 30 day with 1 rf or 90 with no refill if she needs. I will approve once I know what she needs

## 2019-05-08 ENCOUNTER — Ambulatory Visit: Payer: Medicare HMO | Admitting: Internal Medicine

## 2019-05-13 ENCOUNTER — Other Ambulatory Visit: Payer: Self-pay

## 2019-05-13 ENCOUNTER — Encounter (HOSPITAL_COMMUNITY): Payer: Self-pay | Admitting: Emergency Medicine

## 2019-05-13 ENCOUNTER — Observation Stay (HOSPITAL_COMMUNITY)
Admission: EM | Admit: 2019-05-13 | Discharge: 2019-05-14 | Disposition: A | Discharge status: Home / Self Care | Payer: Medicare HMO | Attending: Internal Medicine | Admitting: Internal Medicine

## 2019-05-13 ENCOUNTER — Emergency Department (HOSPITAL_COMMUNITY): Payer: Medicare HMO

## 2019-05-13 DIAGNOSIS — E1165 Type 2 diabetes mellitus with hyperglycemia: Secondary | ICD-10-CM | POA: Diagnosis not present

## 2019-05-13 DIAGNOSIS — R000 Tachycardia, unspecified: Secondary | ICD-10-CM | POA: Diagnosis not present

## 2019-05-13 DIAGNOSIS — Z825 Family history of asthma and other chronic lower respiratory diseases: Secondary | ICD-10-CM | POA: Insufficient documentation

## 2019-05-13 DIAGNOSIS — R0602 Shortness of breath: Secondary | ICD-10-CM | POA: Diagnosis not present

## 2019-05-13 DIAGNOSIS — R2689 Other abnormalities of gait and mobility: Secondary | ICD-10-CM | POA: Insufficient documentation

## 2019-05-13 DIAGNOSIS — R509 Fever, unspecified: Secondary | ICD-10-CM

## 2019-05-13 DIAGNOSIS — K219 Gastro-esophageal reflux disease without esophagitis: Secondary | ICD-10-CM | POA: Insufficient documentation

## 2019-05-13 DIAGNOSIS — U071 COVID-19: Principal | ICD-10-CM

## 2019-05-13 DIAGNOSIS — E878 Other disorders of electrolyte and fluid balance, not elsewhere classified: Secondary | ICD-10-CM | POA: Diagnosis not present

## 2019-05-13 DIAGNOSIS — R070 Pain in throat: Secondary | ICD-10-CM | POA: Diagnosis not present

## 2019-05-13 DIAGNOSIS — R2681 Unsteadiness on feet: Secondary | ICD-10-CM | POA: Insufficient documentation

## 2019-05-13 DIAGNOSIS — R0902 Hypoxemia: Secondary | ICD-10-CM | POA: Diagnosis not present

## 2019-05-13 DIAGNOSIS — Z79899 Other long term (current) drug therapy: Secondary | ICD-10-CM | POA: Diagnosis not present

## 2019-05-13 DIAGNOSIS — IMO0002 Reserved for concepts with insufficient information to code with codable children: Secondary | ICD-10-CM | POA: Diagnosis present

## 2019-05-13 DIAGNOSIS — F339 Major depressive disorder, recurrent, unspecified: Secondary | ICD-10-CM | POA: Insufficient documentation

## 2019-05-13 DIAGNOSIS — E860 Dehydration: Secondary | ICD-10-CM | POA: Diagnosis not present

## 2019-05-13 DIAGNOSIS — Z885 Allergy status to narcotic agent status: Secondary | ICD-10-CM | POA: Insufficient documentation

## 2019-05-13 DIAGNOSIS — E876 Hypokalemia: Secondary | ICD-10-CM | POA: Diagnosis not present

## 2019-05-13 DIAGNOSIS — Z794 Long term (current) use of insulin: Secondary | ICD-10-CM | POA: Diagnosis not present

## 2019-05-13 DIAGNOSIS — Z888 Allergy status to other drugs, medicaments and biological substances status: Secondary | ICD-10-CM | POA: Insufficient documentation

## 2019-05-13 DIAGNOSIS — J45909 Unspecified asthma, uncomplicated: Secondary | ICD-10-CM | POA: Diagnosis not present

## 2019-05-13 DIAGNOSIS — Z833 Family history of diabetes mellitus: Secondary | ICD-10-CM | POA: Insufficient documentation

## 2019-05-13 DIAGNOSIS — B349 Viral infection, unspecified: Secondary | ICD-10-CM | POA: Diagnosis not present

## 2019-05-13 DIAGNOSIS — E1151 Type 2 diabetes mellitus with diabetic peripheral angiopathy without gangrene: Secondary | ICD-10-CM | POA: Diagnosis present

## 2019-05-13 DIAGNOSIS — I10 Essential (primary) hypertension: Secondary | ICD-10-CM | POA: Diagnosis not present

## 2019-05-13 DIAGNOSIS — M1990 Unspecified osteoarthritis, unspecified site: Secondary | ICD-10-CM | POA: Insufficient documentation

## 2019-05-13 DIAGNOSIS — M6281 Muscle weakness (generalized): Secondary | ICD-10-CM | POA: Diagnosis not present

## 2019-05-13 DIAGNOSIS — Z8249 Family history of ischemic heart disease and other diseases of the circulatory system: Secondary | ICD-10-CM | POA: Diagnosis not present

## 2019-05-13 LAB — CBC WITH DIFFERENTIAL/PLATELET
Abs Immature Granulocytes: 0 10*3/uL (ref 0.00–0.07)
Basophils Absolute: 0 10*3/uL (ref 0.0–0.1)
Basophils Relative: 0 %
Eosinophils Absolute: 0 10*3/uL (ref 0.0–0.5)
Eosinophils Relative: 0 %
HCT: 38.9 % (ref 36.0–46.0)
Hemoglobin: 12.7 g/dL (ref 12.0–15.0)
Immature Granulocytes: 0 %
Lymphocytes Relative: 29 %
Lymphs Abs: 0.9 10*3/uL (ref 0.7–4.0)
MCH: 28.7 pg (ref 26.0–34.0)
MCHC: 32.6 g/dL (ref 30.0–36.0)
MCV: 88 fL (ref 80.0–100.0)
Monocytes Absolute: 0.3 10*3/uL (ref 0.1–1.0)
Monocytes Relative: 11 %
Neutro Abs: 1.9 10*3/uL (ref 1.7–7.7)
Neutrophils Relative %: 60 %
Platelets: 164 10*3/uL (ref 150–400)
RBC: 4.42 MIL/uL (ref 3.87–5.11)
RDW: 13.5 % (ref 11.5–15.5)
WBC: 3.2 10*3/uL — ABNORMAL LOW (ref 4.0–10.5)
nRBC: 0 % (ref 0.0–0.2)

## 2019-05-13 LAB — BASIC METABOLIC PANEL
Anion gap: 14 (ref 5–15)
BUN: 7 mg/dL (ref 6–20)
CO2: 29 mmol/L (ref 22–32)
Calcium: 8.2 mg/dL — ABNORMAL LOW (ref 8.9–10.3)
Chloride: 99 mmol/L (ref 98–111)
Creatinine, Ser: 1.07 mg/dL — ABNORMAL HIGH (ref 0.44–1.00)
GFR calc Af Amer: >60 mL/min (ref >60)
GFR calc non Af Amer: 58 mL/min — ABNORMAL LOW (ref >60)
Glucose, Bld: 192 mg/dL — ABNORMAL HIGH (ref 70–99)
Potassium: 2.7 mmol/L — CL (ref 3.5–5.1)
Sodium: 142 mmol/L (ref 135–145)

## 2019-05-13 LAB — MAGNESIUM: Magnesium: 1.6 mg/dL — ABNORMAL LOW (ref 1.7–2.4)

## 2019-05-13 LAB — SARS CORONAVIRUS 2 BY RT PCR (HOSPITAL ORDER, PERFORMED IN ~~LOC~~ HOSPITAL LAB): SARS Coronavirus 2: POSITIVE — AB

## 2019-05-13 LAB — CBG MONITORING, ED: Glucose-Capillary: 190 mg/dL — ABNORMAL HIGH (ref 70–99)

## 2019-05-13 LAB — GLUCOSE, CAPILLARY
Glucose-Capillary: 170 mg/dL — ABNORMAL HIGH (ref 70–99)
Glucose-Capillary: 284 mg/dL — ABNORMAL HIGH (ref 70–99)

## 2019-05-13 MED ORDER — MAGNESIUM SULFATE 2 GM/50ML IV SOLN
2.0000 g | Freq: Once | INTRAVENOUS | Status: AC
  Administered 2019-05-13: 2 g via INTRAVENOUS
  Filled 2019-05-13: qty 50

## 2019-05-13 MED ORDER — ALBUTEROL SULFATE HFA 108 (90 BASE) MCG/ACT IN AERS
2.0000 | INHALATION_SPRAY | Freq: Four times a day (QID) | RESPIRATORY_TRACT | Status: DC
  Administered 2019-05-13 – 2019-05-14 (×4): 2 via RESPIRATORY_TRACT
  Filled 2019-05-13: qty 6.7

## 2019-05-13 MED ORDER — INSULIN ASPART 100 UNIT/ML ~~LOC~~ SOLN
0.0000 [IU] | Freq: Three times a day (TID) | SUBCUTANEOUS | Status: DC
  Administered 2019-05-14: 1 [IU] via SUBCUTANEOUS
  Administered 2019-05-14: 3 [IU] via SUBCUTANEOUS

## 2019-05-13 MED ORDER — ACETAMINOPHEN 325 MG PO TABS
650.0000 mg | ORAL_TABLET | Freq: Once | ORAL | Status: AC
  Administered 2019-05-13: 650 mg via ORAL
  Filled 2019-05-13: qty 2

## 2019-05-13 MED ORDER — POTASSIUM CHLORIDE 10 MEQ/100ML IV SOLN
10.0000 meq | INTRAVENOUS | Status: AC
  Administered 2019-05-13 (×2): 10 meq via INTRAVENOUS
  Filled 2019-05-13 (×3): qty 100

## 2019-05-13 MED ORDER — POTASSIUM CHLORIDE ER 20 MEQ PO TBCR: 40.0000 meq | EXTENDED_RELEASE_TABLET | Freq: Every day | ORAL | 0 refills | Status: DC

## 2019-05-13 MED ORDER — CLONIDINE HCL 0.1 MG PO TABS
0.2000 mg | ORAL_TABLET | Freq: Two times a day (BID) | ORAL | Status: DC
  Administered 2019-05-13 – 2019-05-14 (×2): 0.2 mg via ORAL
  Filled 2019-05-13 (×3): qty 2

## 2019-05-13 MED ORDER — INSULIN ASPART PROT & ASPART (70-30 MIX) 100 UNIT/ML ~~LOC~~ SUSP
16.0000 [IU] | Freq: Two times a day (BID) | SUBCUTANEOUS | Status: DC
  Administered 2019-05-13 – 2019-05-14 (×2): 16 [IU] via SUBCUTANEOUS
  Filled 2019-05-13: qty 10

## 2019-05-13 MED ORDER — GUAIFENESIN-DM 100-10 MG/5ML PO SYRP: 10.0000 mL | ORAL_SOLUTION | ORAL | Status: DC | PRN

## 2019-05-13 MED ORDER — ONDANSETRON HCL 4 MG/2ML IJ SOLN
4.0000 mg | Freq: Once | INTRAMUSCULAR | Status: AC
  Administered 2019-05-13: 4 mg via INTRAVENOUS
  Filled 2019-05-13: qty 2

## 2019-05-13 MED ORDER — ENOXAPARIN SODIUM 40 MG/0.4ML ~~LOC~~ SOLN
40.0000 mg | SUBCUTANEOUS | Status: DC
  Administered 2019-05-13: 40 mg via SUBCUTANEOUS
  Filled 2019-05-13: qty 0.4

## 2019-05-13 MED ORDER — SODIUM CHLORIDE 0.9 % IV SOLN
INTRAVENOUS | Status: DC
  Administered 2019-05-13 – 2019-05-14 (×2): via INTRAVENOUS

## 2019-05-13 MED ORDER — INSULIN ASPART 100 UNIT/ML ~~LOC~~ SOLN
0.0000 [IU] | Freq: Every day | SUBCUTANEOUS | Status: DC
  Administered 2019-05-13: 3 [IU] via SUBCUTANEOUS

## 2019-05-13 MED ORDER — DEXAMETHASONE 4 MG PO TABS
4.0000 mg | ORAL_TABLET | Freq: Every day | ORAL | Status: DC
  Administered 2019-05-13 – 2019-05-14 (×2): 4 mg via ORAL
  Filled 2019-05-13 (×2): qty 1

## 2019-05-13 MED ORDER — INSULIN ASPART PROT & ASPART (70-30 MIX) 100 UNIT/ML PEN: 16.0000 [IU] | PEN_INJECTOR | Freq: Two times a day (BID) | SUBCUTANEOUS | Status: DC

## 2019-05-13 MED ORDER — ONDANSETRON HCL 4 MG/2ML IJ SOLN: 4.0000 mg | Freq: Four times a day (QID) | INTRAMUSCULAR | Status: DC | PRN

## 2019-05-13 MED ORDER — KETOROLAC TROMETHAMINE 15 MG/ML IJ SOLN
15.0000 mg | Freq: Once | INTRAMUSCULAR | Status: AC
  Administered 2019-05-13: 15 mg via INTRAVENOUS
  Filled 2019-05-13: qty 1

## 2019-05-13 MED ORDER — AMLODIPINE BESYLATE 5 MG PO TABS
5.0000 mg | ORAL_TABLET | Freq: Every day | ORAL | Status: DC
  Administered 2019-05-13 – 2019-05-14 (×2): 5 mg via ORAL
  Filled 2019-05-13 (×2): qty 1

## 2019-05-13 MED ORDER — ALUM & MAG HYDROXIDE-SIMETH 200-200-20 MG/5ML PO SUSP
15.0000 mL | ORAL | Status: DC | PRN
  Administered 2019-05-13: 15 mL via ORAL
  Filled 2019-05-13: qty 30

## 2019-05-13 MED ORDER — POTASSIUM CHLORIDE CRYS ER 20 MEQ PO TBCR
40.0000 meq | EXTENDED_RELEASE_TABLET | Freq: Two times a day (BID) | ORAL | Status: DC
  Administered 2019-05-13 – 2019-05-14 (×2): 40 meq via ORAL
  Filled 2019-05-13 (×3): qty 2

## 2019-05-13 MED ORDER — SODIUM CHLORIDE 0.9 % IV BOLUS
1000.0000 mL | Freq: Once | INTRAVENOUS | Status: AC
  Administered 2019-05-13: 1000 mL via INTRAVENOUS

## 2019-05-13 MED ORDER — ONDANSETRON HCL 4 MG PO TABS: 4.0000 mg | ORAL_TABLET | Freq: Four times a day (QID) | ORAL | Status: DC | PRN

## 2019-05-13 MED ORDER — ACETAMINOPHEN 325 MG PO TABS
650.0000 mg | ORAL_TABLET | Freq: Four times a day (QID) | ORAL | Status: DC | PRN
  Administered 2019-05-13: 650 mg via ORAL
  Filled 2019-05-13: qty 2

## 2019-05-13 MED ORDER — ALBUTEROL SULFATE HFA 108 (90 BASE) MCG/ACT IN AERS
2.0000 | INHALATION_SPRAY | Freq: Four times a day (QID) | RESPIRATORY_TRACT | Status: DC | PRN
  Filled 2019-05-13: qty 6.7

## 2019-05-13 NOTE — ED Notes (Signed)
ED TO INPATIENT HANDOFF REPORT  ED Nurse Name and Phone #: Jackelyn Poling, RN  S Name/Age/Gender Heather Walker 58 y.o. female Room/Bed: WA12/WA12  Code Status   Code Status: Prior  Home/SNF/Other Home Patient oriented to: self, place, time and situation Is this baseline? Yes     Triage Complete: Triage complete  Chief Complaint covid exposure  Triage Note Patient arrived by EMS from home. Pt c/o fever, chills, body aches, sore throat, and cough x 2 weeks.   Pt's sister was a confirmed COVID Positive.      Allergies Allergies  Allergen Reactions  . Crestor [Rosuvastatin Calcium] Anaphylaxis  . Metformin And Related Diarrhea    Nausea and diarrhea  . Atorvastatin Other (See Comments)    Neck stiffness  . Codeine Itching    Level of Care/Admitting Diagnosis ED Disposition    ED Disposition Condition [facility]: [place] [[id number]]  Level of Care: Med-Surg [16]  Covid Evaluation: Confirmed COVID Positive  Diagnosis: Pneumonia due to COVID-19 virus [[id number]]  Admitting Physician: Barb Merino [[id number]]  Attending Physician: Barb Merino [[id number]]  Estimated length of stay: past midnight tomorrow  Certification:: I certify this patient will need inpatient services for at least 2 midnights  PT Class (Do Not Modify): Inpatient [101]  PT Acc Code (Do Not Modify): Private [1]       B Medical/Surgery History Past Medical History:  Diagnosis Date  . Arthritis   . Asthma   . Depression   . Diabetes mellitus without complication ([place])   . Heart murmur   . Hypertension    Past Surgical History:  Procedure Laterality Date  . ABDOMINAL HYSTERECTOMY    . APPENDECTOMY    . BLADDER SUSPENSION    . CESAREAN SECTION     3 previous  . TEE WITHOUT CARDIOVERSION N/A 08/24/2017   Procedure: TRANSESOPHAGEAL ECHOCARDIOGRAM (TEE);  Surgeon: Larey Dresser, MD;  Location: [facility] ENDOSCOPY;  Service: Cardiovascular;  Laterality: N/A;   . TUBAL LIGATION       A IV Location/Drains/Wounds Patient Lines/Drains/Airways Status   Active Line/Drains/Airways    Name:   Placement date:   Placement time:   Site:   Days:   Peripheral IV 05/13/19 Left Antecubital   05/13/19    0907    Antecubital   less than 1   Peripheral IV 05/13/19 Right Antecubital   05/13/19    0942    Antecubital   less than 1   Midline Single Lumen 08/27/17 Midline Right Cephalic 8 cm 0 cm   71/69/67    8938    Cephalic   101   External Urinary Catheter   08/20/17    1747    -   631          Intake/Output Last 24 hours  Intake/Output Summary (Last 24 hours) at 05/13/2019 1711 Last data filed at 05/13/2019 1355 Gross per 24 hour  Intake 1100 ml  Output -  Net 1100 ml    Labs/Imaging Results for orders placed or performed during the hospital encounter of 05/13/19 (from the past 48 hour(s))  CBC with Differential     Status: Abnormal   Collection Time: 05/13/19  9:28 AM  Result Value Ref Range   WBC 3.2 (L) 4.0 - 10.5 K/uL   RBC 4.42 3.87 - 5.11 MIL/uL   Hemoglobin 12.7 12.0 - 15.0 g/dL   HCT 38.9 36.0 - 46.0 %   MCV 88.0 80.0 -  100.0 fL   MCH 28.7 26.0 - 34.0 pg   MCHC 32.6 30.0 - 36.0 g/dL   RDW 13.5 11.5 - 15.5 %   Platelets 164 150 - 400 K/uL   nRBC 0.0 0.0 - 0.2 %   Neutrophils Relative % 60 %   Neutro Abs 1.9 1.7 - 7.7 K/uL   Lymphocytes Relative 29 %   Lymphs Abs 0.9 0.7 - 4.0 K/uL   Monocytes Relative 11 %   Monocytes Absolute 0.3 0.1 - 1.0 K/uL   Eosinophils Relative 0 %   Eosinophils Absolute 0.0 0.0 - 0.5 K/uL   Basophils Relative 0 %   Basophils Absolute 0.0 0.0 - 0.1 K/uL   Immature Granulocytes 0 %   Abs Immature Granulocytes 0.00 0.00 - 0.07 K/uL    Comment: Performed at [facility], [place] [address] [place], [place] [address]  Basic metabolic panel     Status: Abnormal   Collection Time: 05/13/19  9:28 AM  Result Value Ref Range   Sodium 142 135 - 145 mmol/L   Potassium 2.7 (LL) 3.5 - 5.1 mmol/L     Comment: CRITICAL RESULT CALLED TO, READ BACK BY AND VERIFIED WITH: SHELIA BINGHAM @ 1028 ON 05/13/2019 C VARNER     Chloride 99 98 - 111 mmol/L   CO2 29 22 - 32 mmol/L   Glucose, Bld 192 (H) 70 - 99 mg/dL   BUN 7 6 - 20 mg/dL   Creatinine, Ser 1.07 (H) 0.44 - 1.00 mg/dL   Calcium 8.2 (L) 8.9 - 10.3 mg/dL   GFR calc non Af Amer 58 (L) >60 mL/min   GFR calc Af Amer >60 >60 mL/min   Anion gap 14 5 - 15    Comment: Performed at [facility], [place] [address], [place],  [address]  CBG monitoring, ED     Status: Abnormal   Collection Time: 05/13/19  9:40 AM  Result Value Ref Range   Glucose-Capillary 190 (H) 70 - 99 mg/dL  Magnesium     Status: Abnormal   Collection Time: 05/13/19 10:00 AM  Result Value Ref Range   Magnesium 1.6 (L) 1.7 - 2.4 mg/dL    Comment: Performed at [facility], [place] [address] [place],  [address]  SARS Coronavirus 2 (CEPHEID- Performed in [facility] hospital lab), Hosp Order     Status: Abnormal   Collection Time: 05/13/19  1:33 PM   Specimen: Nasopharyngeal Swab  Result Value Ref Range   SARS Coronavirus 2 POSITIVE (A) NEGATIVE    Comment: RESULT CALLED TO, READ BACK BY AND VERIFIED WITH: Tsosie Billing [id number] @ [place] (NOTE) If result is NEGATIVE SARS-CoV-2 target nucleic acids are NOT DETECTED. The SARS-CoV-2 RNA is generally detectable in upper and lower  respiratory specimens during the acute phase of infection. The lowest  concentration of SARS-CoV-2 viral copies this assay can detect is 250  copies / mL. A negative result does not preclude SARS-CoV-2 infection  and should not be used as the sole basis for treatment or other  patient management decisions.  A negative result may occur with  improper specimen collection / handling, submission of specimen other  than nasopharyngeal swab, presence of viral mutation(s) within the  areas targeted by this assay, and inadequate number of  viral copies  (<250 copies / mL). A negative result must be combined with clinical  observations, patient history, and epidemiological information. If result is POSITIVE SARS-CoV-2 target nucleic  acids are DET ECTED. The SARS-CoV-2 RNA is generally detectable in upper and lower  respiratory specimens during the acute phase of infection.  Positive  results are indicative of active infection with SARS-CoV-2.  Clinical  correlation with patient history and other diagnostic information is  necessary to determine patient infection status.  Positive results do  not rule out bacterial infection or co-infection with other viruses. If result is PRESUMPTIVE POSTIVE SARS-CoV-2 nucleic acids MAY BE PRESENT.   A presumptive positive result was obtained on the submitted specimen  and confirmed on repeat testing.  While 2019 novel coronavirus  (SARS-CoV-2) nucleic acids may be present in the submitted sample  additional confirmatory testing may be necessary for epidemiological  and / or clinical management purposes  to differentiate between  SARS-CoV-2 and other Sarbecovirus currently known to infect humans.  If clinically indicated additional testing with an alternate test  methodology ([place]) is advised. The SARS-CoV-2 RNA is generally  detectable in upper and lower respiratory specimens during the acute  phase of infection. The expected result is Negative. Fact Sheet for Patients:  StrictlyIdeas.no Fact Sheet for Healthcare Providers: BankingDealers.co.za This test is not yet approved or cleared by the [place] FDA and has been authorized for detection and/or diagnosis of SARS-CoV-2 by FDA under an Emergency Use Authorization (EUA).  This EUA will remain in effect (meaning this test can be used) for the duration of the COVID-19 declaration under Section 564(b)(1) of the Act, 21 U.S.C. section 360bbb-3(b)(1), unless the authorization is  terminated or revoked sooner. Performed at [facility], [place] [address], [place], [place] [address]    Dg Chest Portable 1 View  Result Date: 05/13/2019 CLINICAL DATA:  Fever, shortness of breath and increasing weakness over the past 2 weeks. EXAM: PORTABLE CHEST 1 VIEW COMPARISON:  Single-view of the chest 08/20/2017. PA and lateral chest 04/04/2017. FINDINGS: The lungs are clear. Heart size is normal. No pneumothorax or pleural effusion. No acute or focal bony abnormality. IMPRESSION: Negative chest. Electronically Signed   By: Inge Rise M.D.   On: 05/13/2019 10:10    Pending Labs Unresulted Labs (From admission, onward)    Start     Ordered   05/13/19 1345  SARS Coronavirus 2 (CEPHEID- Performed in [place] hospital lab), Hosp Order  (Symptomatic Patients Labs with Precautions )  Add-on,   AD     05/13/19 1344   Signed and Held  HIV antibody (Routine Testing)  Once,   R     Signed and Held   Signed and Held  CBC with Differential/Platelet  Daily,   R     Signed and Held   Signed and Held  Comprehensive metabolic panel  Daily,   R     Signed and Held   Signed and Held  C-reactive protein  Daily,   R     Signed and Held   Signed and Held  CK  Daily,   R     Signed and Held   Signed and Held  D-dimer, quantitative (not at [facility])  Daily,   R     Signed and Held   Signed and Held  Magnesium  Daily,   R     Signed and Held   Signed and Held  Phosphorus  Daily,   R     Signed and Held          Vitals/Pain Today's Vitals   05/13/19 1200 05/13/19 1300 05/13/19 1330 05/13/19 1655  BP: (!) 160/96 (!) 166/104 (!) 173/98 (!) 167/94  Pulse: 92 92 91 91  Resp: 19 17 (!) 22 19  Temp:   98.3 F (36.8 C)   TempSrc:   Oral   SpO2: 93% 94% 95% 97%  Weight:      Height:      PainSc:        Isolation Precautions Airborne and Contact precautions  Medications Medications  potassium chloride SA (K-DUR) CR tablet 40 mEq (has no administration in time  range)  dexamethasone (DECADRON) tablet 4 mg (4 mg Oral Given 05/13/19 1700)  acetaminophen (TYLENOL) tablet 650 mg (650 mg Oral Given 05/13/19 0942)  sodium chloride 0.9 % bolus 1,000 mL (0 mLs Intravenous Stopped 05/13/19 1257)  potassium chloride 10 mEq in 100 mL IVPB (0 mEq Intravenous Stopped 05/13/19 1355)  ketorolac (TORADOL) 15 MG/ML injection 15 mg (15 mg Intravenous Given 05/13/19 1123)  ondansetron (ZOFRAN) injection 4 mg (4 mg Intravenous Given 05/13/19 1352)    Mobility walks Low fall risk   Focused Assessments Respiratory    R Recommendations: See Admitting Provider Note  Report given to:   Additional Notes:

## 2019-05-13 NOTE — ED Notes (Signed)
Patient can be discharged once medication administration is completed.

## 2019-05-13 NOTE — ED Triage Notes (Signed)
Patient arrived by EMS from home. Pt c/o fever, chills, body aches, sore throat, and cough x 2 weeks.   Pt's sister was a confirmed COVID Positive.

## 2019-05-13 NOTE — Progress Notes (Signed)
This RN called to update pts husband Joneen Boers - he had no ?s

## 2019-05-13 NOTE — ED Provider Notes (Signed)
[facility] DEPT Provider Note   CSN: [id number] Arrival date & time: 05/13/19  8546     History   Chief Complaint No chief complaint on file.   HPI Heather Walker is a 58 y.o. female with past medical history of asthma, hypertension, diabetes not on insulin presents emergency department chief complaint flulike symptoms, COVID exposure.  Patient states approximately 2 weeks ago she started having flulike symptoms.  States that she developed generalized body aches, chills, subjective fevers and sore throat.  States cough is nonproductive, nonbloody.  Has been slowly worsening over this time period.  Also has noted increased shortness of breath over the last few days.  Has had poor appetite, still taking some p.o. though.  No vomiting or diarrhea.  Had been taking Aleve intermittently with some relief.  Last dose was yesterday.  Her sister tested positive for COVID-19.  Patient has not been tested.  Lives with husband at home.     HPI  Past Medical History:  Diagnosis Date  . Arthritis   . Asthma   . Depression   . Diabetes mellitus without complication ([place])   . Heart murmur   . Hypertension     Patient Active Problem List   Diagnosis Date Noted  . Renal insufficiency 03/26/2019  . At risk for cardiovascular event 03/26/2019  . Allergic reaction due to correct medicinal substance properly administered 08/23/2018  . Hypertriglyceridemia 08/05/2018  . Asthma, chronic, unspecified asthma severity, uncomplicated 27/12/5007  . Chronic right shoulder pain 08/05/2018  . Muscle spasm 08/05/2018  . Insomnia 10/05/2017  . Sepsis ([place]) 08/21/2017  . Acute right hip pain 08/21/2017  . Preventative health care 11/20/2016  . Right ankle pain 05/09/2016  . Palpitations 12/08/2014  . DM (diabetes mellitus) type II uncontrolled, periph vascular disorder ([place]) 08/03/2014  . Leg wound, right 08/03/2014  . Hyperglycemia 07/13/2014  . Chest pain 07/12/2014  .  Diabetes mellitus ([place]) 07/12/2014  . Acute kidney injury ([place]) 07/12/2014  . Abdominal pain, right upper quadrant 05/28/2014  . Tachycardia 05/03/2013  . Abscess 11/13/2012  . Breast pain, right 08/10/2012  . OA (osteoarthritis) 03/04/2011  . DEPRESSION, MAJOR, RECURRENT, SEVERE 12/22/2009  . BACK PAIN, LUMBAR, WITH RADICULOPATHY 09/17/2009  . CALF PAIN, RIGHT 09/17/2009  . RENAL CYST 08/28/2009  . LOW BACK PAIN, ACUTE 11/13/2008  . FIBROIDS, UTERUS 09/10/2007  . UTI 09/03/2007  . BACTERIAL VAGINITIS 09/03/2007  . PELVIC PAIN, RIGHT 09/03/2007  . HYPOKALEMIA, HX OF 09/03/2007  . MOLE 06/21/2007  . ALLERGIC RHINITIS 06/21/2007  . HEMOCCULT POSITIVE STOOL 06/21/2007  . SINUSITIS, ACUTE NOS 04/05/2007  . REFLUX, ESOPHAGEAL 04/05/2007  . CHEST PAIN 04/05/2007  . Essential hypertension 03/19/2007  . ALLERGIC RHINITIS, SEASONAL 03/09/2007  . ASTHMA 03/09/2007    Past Surgical History:  Procedure Laterality Date  . ABDOMINAL HYSTERECTOMY    . APPENDECTOMY    . BLADDER SUSPENSION    . CESAREAN SECTION     3 previous  . TEE WITHOUT CARDIOVERSION N/A 08/24/2017   Procedure: TRANSESOPHAGEAL ECHOCARDIOGRAM (TEE);  Surgeon: Larey Dresser, MD;  Location: [facility] ENDOSCOPY;  Service: Cardiovascular;  Laterality: N/A;  . TUBAL LIGATION       OB History    Gravida  3   Para  3   Term  3   Preterm      AB      Living  3     SAB      TAB  Ectopic      Multiple      Live Births               Home Medications    Prior to Admission medications   Medication Sig Start Date End Date Taking? Authorizing Provider  albuterol (PROVENTIL HFA;VENTOLIN HFA) 108 (90 Base) MCG/ACT inhaler Inhale 2 puffs into the lungs every 6 (six) hours as needed. For shortness of breath. 08/02/18   Roma Schanz R, DO  albuterol (PROVENTIL) (2.5 MG/3ML) 0.083% nebulizer solution Take 3 mLs (2.5 mg total) by nebulization every 6 (six) hours as needed for wheezing or shortness of  breath. 10/16/14   Roma Schanz R, DO  amLODipine (NORVASC) 5 MG tablet Take 1 tablet (5 mg total) by mouth daily. 02/11/19   [name], DO  cephALEXin (KEFLEX) 500 MG capsule Take 1 capsule (500 mg total) by mouth 2 (two) times daily. Patient not taking: Reported on 08/23/2018 01/29/18   Carollee Herter, Kendrick Fries R, DO  cloNIDine (CATAPRES) 0.2 MG tablet TAKE 1 TABLET(0.2 MG) BY MOUTH TWICE DAILY 02/11/19   Carollee Herter, Alferd Apa, DO  cyclobenzaprine (FLEXERIL) 5 MG tablet TAKE 1 TABLET(5 MG) BY MOUTH THREE TIMES DAILY AS NEEDED FOR MUSCLE SPASMS 02/11/19   Carollee Herter, Yvonne R, DO  fenofibrate 160 MG tablet Take 1 tablet (160 mg total) by mouth daily. Patient not taking: Reported on 03/26/2019 02/11/19   Roma Schanz R, DO  glucose blood test strip Use as instructed 04/22/19   Carollee Herter, Alferd Apa, DO  insulin aspart protamine - aspart (NOVOLOG MIX 70/30 FLEXPEN) (70-30) 100 UNIT/ML FlexPen Inject 0.16 mLs (16 Units total) into the skin 2 (two) times daily. 03/26/19   Shamleffer, Melanie Crazier, MD  Insulin Pen Needle 32G X 4 MM MISC 2x daily 03/26/19   Shamleffer, Melanie Crazier, MD  Lancets [facility] ULTRASOFT) lancets Use as instructed 04/22/19   Carollee Herter, Alferd Apa, DO  losartan (COZAAR) 100 MG tablet Take 100 mg by mouth.    [provider]  omeprazole (PRILOSEC) 20 MG capsule Take 1 capsule (20 mg total) by mouth daily. 02/11/19   [name], DO  Potassium Chloride ER 20 MEQ TBCR Take 40 mEq by mouth daily. 02/11/19   [name], DO  traMADol (ULTRAM) 50 MG tablet TAKE 1 TABLET(50 MG) BY MOUTH EVERY 6 HOURS AS NEEDED FOR MODERATE PAIN OR SEVERE PAIN 02/11/19   Carollee Herter, Alferd Apa, DO  traZODone (DESYREL) 50 MG tablet Take 0.5-1 tablets (25-50 mg total) by mouth at bedtime as needed. for sleep 02/11/19   Carollee Herter, Alferd Apa, DO  triamterene-hydrochlorothiazide (MAXZIDE) 75-50 MG tablet Take 1 tablet by mouth daily. 02/11/19   [name], DO     Family History Family History  Problem Relation Age of Onset  . Hypertension Mother   . Asthma Mother   . Hypertension Sister   . Asthma Sister   . Diabetes Sister   . Hypertension Maternal Grandmother   . Heart defect Sister   . Asthma Sister   . Aneurysm Sister   . Asthma Sister     Social History Social History   Tobacco Use  . Smoking status: Never Smoker  . Smokeless tobacco: Never Used  Substance Use Topics  . Alcohol use: No  . Drug use: No     Allergies   Crestor [rosuvastatin calcium], Metformin and related, Atorvastatin, and Codeine   Review of Systems  Review of Systems  Constitutional: Positive for chills, fatigue and fever.  HENT: Negative for ear pain and sore throat.   Eyes: Negative for pain and visual disturbance.  Respiratory: Positive for cough and shortness of breath.   Cardiovascular: Negative for chest pain and palpitations.  Gastrointestinal: Negative for abdominal pain and vomiting.  Genitourinary: Negative for dysuria and hematuria.  Musculoskeletal: Negative for arthralgias and back pain.  Skin: Negative for color change and rash.  Neurological: Negative for seizures and syncope.  All other systems reviewed and are negative.    Physical Exam Updated Vital Signs SpO2 95%   Physical Exam Vitals signs and nursing note reviewed.  Constitutional:      General: She is not in acute distress.    Appearance: She is well-developed.  HENT:     Head: Normocephalic and atraumatic.  Eyes:     Conjunctiva/sclera: Conjunctivae normal.  Neck:     Musculoskeletal: Neck supple.  Cardiovascular:     Rate and Rhythm: Regular rhythm. Tachycardia present.     Heart sounds: No murmur.  Pulmonary:     Comments: Mild tachypnea, no significant increase in work of breathing, no wheezing, no crackles Abdominal:     Palpations: Abdomen is soft.     Tenderness: There is no abdominal tenderness.  Skin:    General: Skin is warm and dry.   Neurological:     Mental Status: She is alert.      ED Treatments / Results  Labs (all labs ordered are listed, but only abnormal results are displayed) Labs Reviewed - No data to display  EKG None  Radiology No results found.  Procedures Procedures (including critical care time)  Medications Ordered in ED Medications - No data to display   Initial Impression / Assessment and Plan / ED Course  I have reviewed the triage vital signs and the nursing notes.  Pertinent labs & imaging results that were available during my care of the patient were reviewed by me and considered in my medical decision making (see chart for details).  Clinical Course as of May 12 1729  Mon May 13, 2019  0912 Reviewed triage vitals, nursing notes, PMH   [RD]  0930 Completed initial assessment   [RD]  1043 Potassium(!!): 2.7 [RD]  1610 Reviewed labs, will give K supplementation, check EKG and reassess patient  WBC(!): 3.2 [RD]  1127 Rechecked, reviewed results with patient; performed amb trial with pulse ox with RN in room; patient did well   [RD]  9604 Discussed with Dr. Sloan Leiter who agrees to accept   [RD]    Clinical Course User Index [RD] Lucrezia Starch, MD       58 year old lady presents emergency department with flulike symptoms, positive covid exposure.  Patient with mild tachypnea but no significant hypoxia, chest x-ray without significant pneumonia.  Labs concerning for profound hypokalemia, leukopenia.  Suspect symptoms related to dehydration, COVID-19.  COVID test was positive.  Patient with severe nausea and myalgias, difficulty tolerating p.o.  Given medical comorbidities, symptoms, and electrolyte derangements, will admit to hospitalist service for further management.  Final Clinical Impressions(s) / ED Diagnoses   Final diagnoses:  Acute viral syndrome  Fever, unspecified fever cause  Hypokalemia  COVID-19    ED Discharge Orders    None       Lucrezia Starch, MD 05/13/19 1732

## 2019-05-13 NOTE — Discharge Instructions (Addendum)
Follow with Primary MD Carollee Herter, Alferd Apa, DO in 7 days   Get CBC, CMP, Magnesium, 2 view Chest X ray -  checked next visit within 1 week by Primary MD    Activity: As tolerated with Full fall precautions use walker/cane & assistance as needed  Disposition Home   Diet: Heart Healthy Low Carb  Special Instructions: If you have smoked or chewed Tobacco  in the last 2 yrs please stop smoking, stop any regular Alcohol  and or any Recreational drug use.  On your next visit with your primary care physician please Get Medicines reviewed and adjusted.  Please request your Prim.MD to go over all Hospital Tests and Procedure/Radiological results at the follow up, please get all Hospital records sent to your Prim MD by signing hospital release before you go home.  If you experience worsening of your admission symptoms, develop shortness of breath, life threatening emergency, suicidal or homicidal thoughts you must seek medical attention immediately by calling 911 or calling your MD immediately  if symptoms less severe.  You Must read complete instructions/literature along with all the possible adverse reactions/side effects for all the Medicines you take and that have been prescribed to you. Take any new Medicines after you have completely understood and accpet all the possible adverse reactions/side effects.       Person Under Monitoring Name: Heather Walker  Location: [place] [address]   Infection Prevention Recommendations for Individuals Confirmed to have, or Being Evaluated for, 2019 Novel Coronavirus (COVID-19) Infection Who Receive Care at Home  Individuals who are confirmed to have, or are being evaluated for, COVID-19 should follow the prevention steps below until a healthcare provider or local or state health department says they can return to normal activities.  Stay home except to get medical care You should restrict activities outside your home,  except for getting medical care. Do not go to work, school, or public areas, and do not use public transportation or taxis.  Call ahead before visiting your doctor Before your medical appointment, call the healthcare provider and tell them that you have, or are being evaluated for, COVID-19 infection. This will help the healthcare providers office take steps to keep other people from getting infected. Ask your healthcare provider to call the local or state health department.  Monitor your symptoms Seek prompt medical attention if your illness is worsening (e.g., difficulty breathing). Before going to your medical appointment, call the healthcare provider and tell them that you have, or are being evaluated for, COVID-19 infection. Ask your healthcare provider to call the local or state health department.  Wear a facemask You should wear a facemask that covers your nose and mouth when you are in the same room with other people and when you visit a healthcare provider. People who live with or visit you should also wear a facemask while they are in the same room with you.  Separate yourself from other people in your home As much as possible, you should stay in a different room from other people in your home. Also, you should use a separate bathroom, if available.  Avoid sharing household items You should not share dishes, drinking glasses, cups, eating utensils, towels, bedding, or other items with other people in your home. After using these items, you should wash them thoroughly with soap and water.  Cover your coughs and sneezes Cover your mouth and nose with a tissue when you cough or sneeze, or  you can cough or sneeze into your sleeve. Throw used tissues in a lined trash can, and immediately wash your hands with soap and water for at least 20 seconds or use an alcohol-based hand rub.  Wash your [facility] your hands often and thoroughly with soap and water for at least 20 seconds.  You can use an alcohol-based hand sanitizer if soap and water are not available and if your hands are not visibly dirty. Avoid touching your eyes, nose, and mouth with unwashed hands.   Prevention Steps for Caregivers and Household Members of Individuals Confirmed to have, or Being Evaluated for, COVID-19 Infection Being Cared for in the Home  If you live with, or provide care at home for, a person confirmed to have, or being evaluated for, COVID-19 infection please follow these guidelines to prevent infection:  Follow healthcare providers instructions Make sure that you understand and can help the patient follow any healthcare provider instructions for all care.  Provide for the patients basic needs You should help the patient with basic needs in the home and provide support for getting groceries, prescriptions, and other personal needs.  Monitor the patients symptoms If they are getting sicker, call his or her medical provider and tell them that the patient has, or is being evaluated for, COVID-19 infection. This will help the healthcare providers office take steps to keep other people from getting infected. Ask the healthcare provider to call the local or state health department.  Limit the number of people who have contact with the patient  If possible, have only one caregiver for the patient.  Other household members should stay in another home or place of residence. If this is not possible, they should stay  in another room, or be separated from the patient as much as possible. Use a separate bathroom, if available.  Restrict visitors who do not have an essential need to be in the home.  Keep older adults, very young children, and other sick people away from the patient Keep older adults, very young children, and those who have compromised immune systems or chronic health conditions away from the patient. This includes people with chronic heart, lung, or kidney conditions,  diabetes, and cancer.  Ensure good ventilation Make sure that shared spaces in the home have good air flow, such as from an air conditioner or an opened window, weather permitting.  Wash your hands often  Wash your hands often and thoroughly with soap and water for at least 20 seconds. You can use an alcohol based hand sanitizer if soap and water are not available and if your hands are not visibly dirty.  Avoid touching your eyes, nose, and mouth with unwashed hands.  Use disposable paper towels to dry your hands. If not available, use dedicated cloth towels and replace them when they become wet.  Wear a facemask and gloves  Wear a disposable facemask at all times in the room and gloves when you touch or have contact with the patients blood, body fluids, and/or secretions or excretions, such as sweat, saliva, sputum, nasal mucus, vomit, urine, or feces.  Ensure the mask fits over your nose and mouth tightly, and do not touch it during use.  Throw out disposable facemasks and gloves after using them. Do not reuse.  Wash your hands immediately after removing your facemask and gloves.  If your personal clothing becomes contaminated, carefully remove clothing and launder. Wash your hands after handling contaminated clothing.  Place all used disposable facemasks,  gloves, and other waste in a lined container before disposing them with other household waste.  Remove gloves and wash your hands immediately after handling these items.  Do not share dishes, glasses, or other household items with the patient  Avoid sharing household items. You should not share dishes, drinking glasses, cups, eating utensils, towels, bedding, or other items with a patient who is confirmed to have, or being evaluated for, COVID-19 infection.  After the person uses these items, you should wash them thoroughly with soap and water.  Wash laundry thoroughly  Immediately remove and wash clothes or bedding that have  blood, body fluids, and/or secretions or excretions, such as sweat, saliva, sputum, nasal mucus, vomit, urine, or feces, on them.  Wear gloves when handling laundry from the patient.  Read and follow directions on labels of laundry or clothing items and detergent. In general, wash and dry with the warmest temperatures recommended on the label.  Clean all areas the individual has used often  Clean all touchable surfaces, such as counters, tabletops, doorknobs, bathroom fixtures, toilets, phones, keyboards, tablets, and bedside tables, every day. Also, clean any surfaces that may have blood, body fluids, and/or secretions or excretions on them.  Wear gloves when cleaning surfaces the patient has come in contact with.  Use a diluted bleach solution (e.g., dilute bleach with 1 part bleach and 10 parts water) or a household disinfectant with a label that says EPA-registered for coronaviruses. To make a bleach solution at home, add 1 tablespoon of bleach to 1 quart (4 cups) of water. For a larger supply, add  cup of bleach to 1 gallon (16 cups) of water.  Read labels of cleaning products and follow recommendations provided on product labels. Labels contain instructions for safe and effective use of the cleaning product including precautions you should take when applying the product, such as wearing gloves or eye protection and making sure you have good ventilation during use of the product.  Remove gloves and wash hands immediately after cleaning.  Monitor yourself for signs and symptoms of illness Caregivers and household members are considered close contacts, should monitor their health, and will be asked to limit movement outside of the home to the extent possible. Follow the monitoring steps for close contacts listed on the symptom monitoring form.   ? If you have additional questions, contact your local health department or call the epidemiologist on call at [phone] (available 24/7). ?  This guidance is subject to change. For the most up-to-date guidance from [facility], please refer to their website: [email or web address]

## 2019-05-13 NOTE — H&P (Signed)
History and Physical    Heather Walker MLY:[id number] DOB: 1961/06/04 DOA: 05/13/2019  PCP: [name], DO  Patient coming from: home   I have personally briefly reviewed patient's old medical records available.   Chief Complaint: Not feeling well, fever, headache, diarrhea.  HPI: Heather Walker is a 58 y.o. female with medical history significant of hypertension, depression, type 2 diabetes on insulin who presents to the hospital with complaints of fever, chills, body ache, sore throat and cough for 2 weeks.  Multiple family members with COVID-19 infection.  According to the patient, she started having flulike symptoms for about 2 weeks.  She developed generalized body aches, chills and fever with sore throat.  She has cough which is mostly nonproductive.  Patient is very worried about getting progressively weaker and weaker.  Not getting enough sleep.  Some shortness of breath but denies any wheezing.  She has poor appetite.  Denies any nausea or vomiting.  But she is having multiple episodes of loose watery stool for last few days.  Today, she is extremely weak so came to the emergency room. ED Course: On room air.  Ambulated on room air without discomfort.  Febrile with temperature 100.4.  WBC 3.2 with potassium of 2.7.  Renal function tests are normal.  Clinically dehydrated.  Chest x-ray is clear. Patient is clinically dehydrated, extremely fatigued and weak and will need monitoring in the hospital.  Review of Systems: all systems are reviewed and pertinent positive as per HPI otherwise rest are negative.    Past Medical History:  Diagnosis Date  . Arthritis   . Asthma   . Depression   . Diabetes mellitus without complication ([place])   . Heart murmur   . Hypertension     Past Surgical History:  Procedure Laterality Date  . ABDOMINAL HYSTERECTOMY    . APPENDECTOMY    . BLADDER SUSPENSION    . CESAREAN SECTION     3 previous  . TEE WITHOUT CARDIOVERSION N/A 08/24/2017    Procedure: TRANSESOPHAGEAL ECHOCARDIOGRAM (TEE);  Surgeon: Larey Dresser, MD;  Location: [facility] ENDOSCOPY;  Service: Cardiovascular;  Laterality: N/A;  . TUBAL LIGATION       reports that she has never smoked. She has never used smokeless tobacco. She reports that she does not drink alcohol or use drugs.  Allergies  Allergen Reactions  . Crestor [Rosuvastatin Calcium] Anaphylaxis  . Metformin And Related Diarrhea    Nausea and diarrhea  . Atorvastatin Other (See Comments)    Neck stiffness  . Codeine Itching    Family History  Problem Relation Age of Onset  . Hypertension Mother   . Asthma Mother   . Hypertension Sister   . Asthma Sister   . Diabetes Sister   . Hypertension Maternal Grandmother   . Heart defect Sister   . Asthma Sister   . Aneurysm Sister   . Asthma Sister      Prior to Admission medications   Medication Sig Start Date End Date Taking? Authorizing Provider  amLODipine (NORVASC) 5 MG tablet Take 1 tablet (5 mg total) by mouth daily. 02/11/19  Yes Roma Schanz R, DO  cloNIDine (CATAPRES) 0.2 MG tablet TAKE 1 TABLET(0.2 MG) BY MOUTH TWICE DAILY 02/11/19  Yes Roma Schanz R, DO  insulin aspart protamine - aspart (NOVOLOG MIX 70/30 FLEXPEN) (70-30) 100 UNIT/ML FlexPen Inject 0.16 mLs (16 Units total) into the skin 2 (two) times daily. 03/26/19  Yes Shamleffer, Melanie Crazier,  MD  naproxen sodium (ALEVE) 220 MG tablet Take 220 mg by mouth 2 (two) times daily as needed (headache/pain).   Yes [provider]  triamterene-hydrochlorothiazide (MAXZIDE) 75-50 MG tablet Take 1 tablet by mouth daily. 02/11/19  Yes Roma Schanz R, DO  glucose blood test strip Use as instructed 04/22/19   Carollee Herter, Alferd Apa, DO  Insulin Pen Needle 32G X 4 MM MISC 2x daily 03/26/19   Shamleffer, Melanie Crazier, MD  Lancets [facility] ULTRASOFT) lancets Use as instructed 04/22/19   [name], DO    Physical Exam: Vitals:   05/13/19 1100 05/13/19 1200  05/13/19 1300 05/13/19 1330  BP: (!) 170/106 (!) 160/96 (!) 166/104 (!) 173/98  Pulse: 90 92 92 91  Resp: 19 19 17  (!) 22  Temp:    98.3 F (36.8 C)  TempSrc:    Oral  SpO2: 91% 93% 94% 95%  Weight:      Height:        Constitutional: NAD, calm, comfortable Vitals:   05/13/19 1100 05/13/19 1200 05/13/19 1300 05/13/19 1330  BP: (!) 170/106 (!) 160/96 (!) 166/104 (!) 173/98  Pulse: 90 92 92 91  Resp: 19 19 17  (!) 22  Temp:    98.3 F (36.8 C)  TempSrc:    Oral  SpO2: 91% 93% 94% 95%  Weight:      Height:       Eyes: PERRL, lids and conjunctivae normal ENMT: Mucous membranes are dry . Posterior pharynx clear of any exudate or lesions.Normal dentition.  Neck: normal, supple, no masses, no thyromegaly Respiratory: clear to auscultation bilaterally, no wheezing, no crackles. Normal respiratory effort. No accessory muscle use.  No added sounds. Cardiovascular: Regular rate and rhythm, no murmurs / rubs / gallops. No extremity edema. 2+ pedal pulses. No carotid bruits.  Abdomen: no tenderness, no masses palpated. No hepatosplenomegaly. Bowel sounds positive.  Obese and pendulous. Musculoskeletal: no clubbing / cyanosis. No joint deformity upper and lower extremities. Good ROM, no contractures. Normal muscle tone.  Skin: no rashes, lesions, ulcers. No induration Neurologic: CN 2-12 grossly intact. Sensation intact, DTR normal. Strength 5/5 in all 4.  Psychiatric: Normal judgment and insight. Alert and oriented x 3.  Anxious.    Labs on Admission: I have personally reviewed following labs and imaging studies  CBC: Recent Labs  Lab 05/13/19 0928  WBC 3.2*  NEUTROABS 1.9  HGB 12.7  HCT 38.9  MCV 88.0  PLT 638   Basic Metabolic Panel: Recent Labs  Lab 05/13/19 0928  NA 142  K 2.7*  CL 99  CO2 29  GLUCOSE 192*  BUN 7  CREATININE 1.07*  CALCIUM 8.2*   GFR: Estimated Creatinine Clearance: 64.3 mL/min (A) (by C-G formula based on SCr of 1.07 mg/dL (H)). Liver Function  Tests: No results for input(s): AST, ALT, ALKPHOS, BILITOT, PROT, ALBUMIN in the last 168 hours. No results for input(s): LIPASE, AMYLASE in the last 168 hours. No results for input(s): AMMONIA in the last 168 hours. Coagulation Profile: No results for input(s): INR, PROTIME in the last 168 hours. Cardiac Enzymes: No results for input(s): CKTOTAL, CKMB, CKMBINDEX, TROPONINI in the last 168 hours. BNP (last 3 results) No results for input(s): PROBNP in the last 8760 hours. HbA1C: No results for input(s): HGBA1C in the last 72 hours. CBG: Recent Labs  Lab 05/13/19 0940  GLUCAP 190*   Lipid Profile: No results for input(s): CHOL, HDL, LDLCALC, TRIG, CHOLHDL, LDLDIRECT in the last 72  hours. Thyroid Function Tests: No results for input(s): TSH, T4TOTAL, FREET4, T3FREE, THYROIDAB in the last 72 hours. Anemia Panel: No results for input(s): VITAMINB12, FOLATE, FERRITIN, TIBC, IRON, RETICCTPCT in the last 72 hours. Urine analysis:    Component Value Date/Time   COLORURINE YELLOW 08/20/2017 1841   APPEARANCEUR CLEAR 08/20/2017 1841   LABSPEC 1.010 08/20/2017 1841   PHURINE 6.0 08/20/2017 1841   GLUCOSEU >=500 (A) 08/20/2017 1841   HGBUR NEGATIVE 08/20/2017 1841   HGBUR negative 09/03/2007 1610   BILIRUBINUR NEGATIVE 08/20/2017 1841   BILIRUBINUR neg 05/09/2016 1324   KETONESUR NEGATIVE 08/20/2017 1841   PROTEINUR NEGATIVE 08/20/2017 1841   UROBILINOGEN 0.2 05/09/2016 1324   UROBILINOGEN 1.0 02/09/2014 1257   NITRITE NEGATIVE 08/20/2017 1841   LEUKOCYTESUR NEGATIVE 08/20/2017 1841    Radiological Exams on Admission: Dg Chest Portable 1 View  Result Date: 05/13/2019 CLINICAL DATA:  Fever, shortness of breath and increasing weakness over the past 2 weeks. EXAM: PORTABLE CHEST 1 VIEW COMPARISON:  Single-view of the chest 08/20/2017. PA and lateral chest 04/04/2017. FINDINGS: The lungs are clear. Heart size is normal. No pneumothorax or pleural effusion. No acute or focal bony  abnormality. IMPRESSION: Negative chest. Electronically Signed   By: Inge Rise M.D.   On: 05/13/2019 10:10    EKG: Independently reviewed.  Sinus tachycardia.  No acute ST-T wave changes.  Assessment/Plan Principal Problem:   COVID-19 virus infection Active Problems:   Essential hypertension   Hypokalemia   DM (diabetes mellitus) type II uncontrolled, periph vascular disorder ([place])     1.  COVID-19 virus infection with profound debility and electrolyte abnormalities: Agree with admission given severity of symptoms. Patient is clinically dehydrated, will hydrate with IV fluids and monitor clinically as well as recheck her levels in the morning. We will aggressively replace her potassium and recheck in the morning.  Check magnesium and phosphorus. Patient will work with PT and OT to improve her strength. Chest physiotherapy, incentive spirometry, deep breathing exercises, sputum induction, mucolytic's and bronchodilators. Will treat with short course of his steroids.  No indication for antibiotics. Daily inflammatory biomarkers will be checked.  2.  Hypertension: Blood pressure is elevated.  She will resume home medications tonight.  3.  Type 2 diabetes on insulin: Blood pressures fairly controlled.  Will resume home doses of long-acting insulin and keep on sliding scale insulin.  4.  Hypokalemia: As above.  Also checking magnesium.  Patient has severe electrolyte abnormalities in the context of COVID-19 infection.  She will need very close monitoring in the hospital and anticipate a hospitalization for more than 2 midnights.  DVT prophylaxis: Lovenox subcu Code Status: Full code Family Communication: None Disposition Plan: Home after hospitalization Consults called: None Admission status: Inpatient given severity of symptoms   Barb Merino MD [facility] Pager [phone]  If 7PM-7AM, please contact night-coverage [email or web address] Password [facility]  05/13/2019,  4:35 PM

## 2019-05-13 NOTE — ED Notes (Signed)
[facility] has arrived for patient transport.  This Probation officer attempted to call report to [facility]. This Probation officer was told floor nurse would give this Probation officer a call back for report.

## 2019-05-14 DIAGNOSIS — J1282 Pneumonia due to coronavirus disease 2019: Secondary | ICD-10-CM | POA: Diagnosis present

## 2019-05-14 DIAGNOSIS — U071 COVID-19: Secondary | ICD-10-CM | POA: Diagnosis present

## 2019-05-14 LAB — CBC WITH DIFFERENTIAL/PLATELET
Abs Immature Granulocytes: 0.01 10*3/uL (ref 0.00–0.07)
Basophils Absolute: 0 10*3/uL (ref 0.0–0.1)
Basophils Relative: 0 %
Eosinophils Absolute: 0 10*3/uL (ref 0.0–0.5)
Eosinophils Relative: 0 %
HCT: 34.8 % — ABNORMAL LOW (ref 36.0–46.0)
Hemoglobin: 11.4 g/dL — ABNORMAL LOW (ref 12.0–15.0)
Immature Granulocytes: 0 %
Lymphocytes Relative: 22 %
Lymphs Abs: 0.6 10*3/uL — ABNORMAL LOW (ref 0.7–4.0)
MCH: 28.9 pg (ref 26.0–34.0)
MCHC: 32.8 g/dL (ref 30.0–36.0)
MCV: 88.3 fL (ref 80.0–100.0)
Monocytes Absolute: 0.1 10*3/uL (ref 0.1–1.0)
Monocytes Relative: 5 %
Neutro Abs: 1.9 10*3/uL (ref 1.7–7.7)
Neutrophils Relative %: 73 %
Platelets: 158 10*3/uL (ref 150–400)
RBC: 3.94 MIL/uL (ref 3.87–5.11)
RDW: 13.7 % (ref 11.5–15.5)
WBC: 2.6 10*3/uL — ABNORMAL LOW (ref 4.0–10.5)
nRBC: 0 % (ref 0.0–0.2)

## 2019-05-14 LAB — COMPREHENSIVE METABOLIC PANEL
ALT: 25 U/L (ref 0–44)
AST: 30 U/L (ref 15–41)
Albumin: 3.1 g/dL — ABNORMAL LOW (ref 3.5–5.0)
Alkaline Phosphatase: 62 U/L (ref 38–126)
Anion gap: 9 (ref 5–15)
BUN: 9 mg/dL (ref 6–20)
CO2: 29 mmol/L (ref 22–32)
Calcium: 7.7 mg/dL — ABNORMAL LOW (ref 8.9–10.3)
Chloride: 102 mmol/L (ref 98–111)
Creatinine, Ser: 1.01 mg/dL — ABNORMAL HIGH (ref 0.44–1.00)
GFR calc Af Amer: >60 mL/min (ref >60)
GFR calc non Af Amer: >60 mL/min (ref >60)
Glucose, Bld: 245 mg/dL — ABNORMAL HIGH (ref 70–99)
Potassium: 3.5 mmol/L (ref 3.5–5.1)
Sodium: 140 mmol/L (ref 135–145)
Total Bilirubin: 0.5 mg/dL (ref 0.3–1.2)
Total Protein: 7.1 g/dL (ref 6.5–8.1)

## 2019-05-14 LAB — D-DIMER, QUANTITATIVE: D-Dimer, Quant: 0.34 ug/mL-FEU (ref 0.00–0.50)

## 2019-05-14 LAB — GLUCOSE, CAPILLARY
Glucose-Capillary: 192 mg/dL — ABNORMAL HIGH (ref 70–99)
Glucose-Capillary: 243 mg/dL — ABNORMAL HIGH (ref 70–99)

## 2019-05-14 LAB — C-REACTIVE PROTEIN: CRP: 5 mg/dL — ABNORMAL HIGH (ref <1.0)

## 2019-05-14 LAB — FERRITIN: Ferritin: 409 ng/mL — ABNORMAL HIGH (ref 11–307)

## 2019-05-14 LAB — LACTATE DEHYDROGENASE: LDH: 208 U/L — ABNORMAL HIGH (ref 98–192)

## 2019-05-14 LAB — MAGNESIUM: Magnesium: 1.9 mg/dL (ref 1.7–2.4)

## 2019-05-14 LAB — BRAIN NATRIURETIC PEPTIDE: B Natriuretic Peptide: 33.3 pg/mL (ref 0.0–100.0)

## 2019-05-14 MED ORDER — METHYLPREDNISOLONE 4 MG PO TBPK: ORAL_TABLET | ORAL | 0 refills | Status: DC

## 2019-05-14 NOTE — Evaluation (Signed)
Physical Therapy Evaluation Patient Details Name: Heather Walker MRN: [id number] DOB: [birth year] Today's Date: 05/14/2019   History of Present Illness  58 y.o. female admitted on 05/13/19 for fever, HA, diarrhea.  Found to have COVID 19, electrolyte abnormalities (hypokalemia).  Pt with significant PMH of HTN, DM.    Clinical Impression  Pt is weak and debilitated requiring a RW for in room gait.  O2 sats stayed above 90% on RA during mobility without audible DOE.  Pt has support from her husband at home at discharge and would benefit from therapy follow up if able.   PT to follow acutely for deficits listed below.      Follow Up Recommendations Home health PT    Equipment Recommendations  Rolling walker with 5" wheels    Recommendations for Other Services   NA    Precautions / Restrictions Precautions Precautions: Fall Precaution Comments: unsteady on her feet      Mobility  Bed Mobility Overal bed mobility: Needs Assistance Bed Mobility: Supine to Sit;Sit to Supine     Supine to sit: Modified independent (Device/Increase time) Sit to supine: Min guard   General bed mobility comments: Min guard assist to lightly help ensure both legs returned to bed when going back to supine.   Transfers Overall transfer level: Needs assistance Equipment used: Rolling walker (2 wheeled);1 person hand held assist Transfers: Sit to/from Stand Sit to Stand: Min assist         General transfer comment: Min assist to power up to standing, less assist with RW  Ambulation/Gait Ambulation/Gait assistance: Min assist Gait Distance (Feet): 20 Feet Assistive device: 1 person hand held assist;Rolling walker (2 wheeled) Gait Pattern/deviations: Step-through pattern;Staggering left;Staggering right Gait velocity: decreased Gait velocity interpretation: 1.31 - 2.62 ft/sec, indicative of limited community ambulator General Gait Details: Pt with moderately staggering gait pattern, heavy min  assist with hand held support, lighter min assist with RW use.          Balance Overall balance assessment: Needs assistance Sitting-balance support: Feet supported;Bilateral upper extremity supported Sitting balance-Leahy Scale: Fair Sitting balance - Comments: resting on both arms in sitting likely due to weakness and fatigue   Standing balance support: Bilateral upper extremity supported;Single extremity supported Standing balance-Leahy Scale: Fair Standing balance comment: needs external support in standing.                             Pertinent Vitals/Pain Pain Assessment: Faces Faces Pain Scale: Hurts even more Pain Location: left flank Pain Descriptors / Indicators: Grimacing;Guarding Pain Intervention(s): Limited activity within patient's tolerance;Monitored during session;Repositioned    Home Living Family/patient expects to be discharged to:: Private residence Living Arrangements: Spouse/significant other Available Help at Discharge: Family;Available 24 hours/day Type of Home: Apartment Home Access: Stairs to enter Entrance Stairs-Rails: None Entrance Stairs-Number of Steps: 3 Home Layout: One level Home Equipment: None      Prior Function Level of Independence: Independent               Hand Dominance   Dominant Hand: Right    Extremity/Trunk Assessment   Upper Extremity Assessment Upper Extremity Assessment: Defer to OT evaluation    Lower Extremity Assessment Lower Extremity Assessment: Generalized weakness    Cervical / Trunk Assessment Cervical / Trunk Assessment: Normal  Communication   Communication: No difficulties  Cognition Arousal/Alertness: Awake/alert Behavior During Therapy: Flat affect Overall Cognitive Status: Within Functional Limits for tasks assessed  General Comments: Pt doesn't say much and when she does it is low tone, mumbling.  PT had to ask her to repeat  herself several times.        General Comments General comments (skin integrity, edema, etc.): Lowest O2 sat observed during mobility was 91% on RA during gait, no audible DOE.        Assessment/Plan    PT Assessment Patient needs continued PT services  PT Problem List Decreased activity tolerance;Decreased strength;Decreased balance;Decreased mobility;Decreased knowledge of use of DME;Decreased knowledge of precautions;Cardiopulmonary status limiting activity;Obesity       PT Treatment Interventions DME instruction;Gait training;Stair training;Functional mobility training;Therapeutic activities;Therapeutic exercise;Balance training;Patient/family education    PT Goals (Current goals can be found in the Care Plan section)  Acute Rehab PT Goals Patient Stated Goal: to feel better, get stronger PT Goal Formulation: With patient Time For Goal Achievement: 05/28/19 Potential to Achieve Goals: Good    Frequency Min 3X/week           AM-PAC PT "6 Clicks" Mobility  Outcome Measure Help needed turning from your back to your side while in a flat bed without using bedrails?: A Little Help needed moving from lying on your back to sitting on the side of a flat bed without using bedrails?: A Little Help needed moving to and from a bed to a chair (including a wheelchair)?: A Little Help needed standing up from a chair using your arms (e.g., wheelchair or bedside chair)?: A Little Help needed to walk in hospital room?: A Little Help needed climbing 3-5 steps with a railing? : A Little 6 Click Score: 18    End of Session   Activity Tolerance: Patient limited by fatigue Patient left: in bed;with call bell/phone within reach   PT Visit Diagnosis: Muscle weakness (generalized) (M62.81);Difficulty in walking, not elsewhere classified (R26.2)    Time: 1031-1101 PT Time Calculation (min) (ACUTE ONLY): 30 min   Charges:        Wells Guiles B. Abdo Denault, PT, DPT  Acute  Rehabilitation [phone] pager #[phone] office  @ [facility]: [phone]   PT Evaluation $PT Eval Moderate Complexity: 1 Mod PT Treatments $Gait Training: 8-22 mins        05/14/2019, 11:08 AM

## 2019-05-14 NOTE — TOC Transition Note (Signed)
Transition of Care [facility]) - CM/SW Discharge Note   Patient Details  Name: Heather Walker MRN: [id number] Date of Birth: [birth year]  [facility]) CM/SW Contact:  Ninfa Meeker, RN Phone Number: [phone] remotely) 05/14/2019, 3:37 PM   Clinical Narrative:  57 yr old female admitted and treated for COVID 19. Thankfully she is improving enough to be discharged. Case manager spoke with patient via phone to discuss discharge needs. Referral for Home Health was called to Adela Lank, [facility] Liaison. Patient will have family support at discharge.     Final next level of care: [place] Barriers to Discharge: No Barriers Identified   Patient Goals and CMS Choice Patient states their goals for this hospitalization and ongoing recovery are:: to get better CMS Medicare.gov Compare Post Acute Care list provided to:: Patient(choice offered via telephone) Choice offered to / list presented to : Patient  Discharge Placement                       Discharge Plan and Services   Discharge Planning Services: CM Consult Post Acute Care Choice: Durable Medical Equipment, Home Health          DME Arranged: Walker rolling DME Agency: [place] Date DME Agency Contacted: 05/14/19 Time DME Agency Contacted: [phone] Representative spoke with at DME Agency: Learta Codding HH Arranged: PT, OT [facility] Agency: [place] Date [place]: 05/14/19 Time [place]: 90 Representative spoke with at [place]: [place] Interventions     Readmission Risk Interventions No flowsheet data found.

## 2019-05-14 NOTE — Discharge Summary (Signed)
Heather Walker SMO:[id number] DOB: 1961/04/02 DOA: 05/13/2019  PCP: [name], DO  Admit date: 05/13/2019  Discharge date: 05/14/2019  Admitted From: Home   Disposition:  Home   Recommendations for Outpatient Follow-up:   Follow up with PCP in 1-2 weeks  PCP Please obtain BMP/CBC, 2 view CXR in 1week,  (see Discharge instructions)   PCP Please follow up on the following pending results:    Home Health: RN, PT , OT Equipment/Devices: None  Consultations: None  Discharge Condition: Stable    CODE STATUS: Full    Diet Recommendation: Heart Healthy Low Carb    Chief Complaint  Patient presents with  . Fatigue  . COVID-19 Exposure     Brief history of present illness from the day of admission and additional interim summary     Heather Walker is a 58 y.o. female with medical history significant of hypertension, depression, type 2 diabetes on insulin who presents to the hospital with complaints of fever, chills, body ache, sore throat and cough for 2 weeks, she came to the ER where she was diagnosed with COVID-19 infection, dehydration, hypokalemia and hypomagnesemia along with generalized weakness and she was kept in the hospital for further care.                                                                 Hospital Course    1.  Generalized weakness, hypokalemia, hypomagnesemia likely arising for extreme dehydration caused by diuretic, was given IV fluids, electrolytes were replaced and are now stable.  She is close to baseline.  Will work with PT and be discharged home with home RN and PT.  She is symptom-free.  2.  Incidental finding of COVID-19 infection.  No pulmonary symptoms, no hypoxia, no fever, unremarkable inflammatory markers, stable chest x-ray, low-dose Medrol Dosepak with outpatient  PCP follow-up in a week.  Patient advised to seek help if she gets hypoxic or short of breath in the next few days.  3.  DM type II.  Continue home regimen.  4.  Severe hypokalemia and hypomagnesemia.  Diuretic induced, diuretic stopped follow with PCP.   Discharge diagnosis     Principal Problem:   COVID-19 virus infection Active Problems:   Essential hypertension   Hypokalemia   DM (diabetes mellitus) type II uncontrolled, periph vascular disorder [facility])    Discharge instructions    Discharge Instructions    Discharge instructions   Complete by: As directed    Follow with Primary MD Carollee Herter, Alferd Apa, DO in 7 days   Get CBC, CMP, Magnesium, 2 view Chest X ray -  checked next visit within 1 week by Primary MD    Activity: As tolerated with Full fall precautions use walker/cane & assistance as needed  Disposition  Home   Diet: Heart Healthy Low Carb  Special Instructions: If you have smoked or chewed Tobacco  in the last 2 yrs please stop smoking, stop any regular Alcohol  and or any Recreational drug use.  On your next visit with your primary care physician please Get Medicines reviewed and adjusted.  Please request your Prim.MD to go over all Hospital Tests and Procedure/Radiological results at the follow up, please get all Hospital records sent to your Prim MD by signing hospital release before you go home.  If you experience worsening of your admission symptoms, develop shortness of breath, life threatening emergency, suicidal or homicidal thoughts you must seek medical attention immediately by calling 911 or calling your MD immediately  if symptoms less severe.  You Must read complete instructions/literature along with all the possible adverse reactions/side effects for all the Medicines you take and that have been prescribed to you. Take any new Medicines after you have completely understood and accpet all the possible adverse reactions/side effects.   Increase  activity slowly   Complete by: As directed       Discharge Medications   Allergies as of 05/14/2019      Reactions   Crestor [rosuvastatin Calcium] Anaphylaxis   Metformin And Related Diarrhea   Nausea and diarrhea   Atorvastatin Other (See Comments)   Neck stiffness   Codeine Itching      Medication List    STOP taking these medications   naproxen sodium 220 MG tablet Commonly known as: ALEVE   Potassium Chloride ER 20 MEQ Tbcr   triamterene-hydrochlorothiazide 75-50 MG tablet Commonly known as: MAXZIDE     TAKE these medications   amLODipine 5 MG tablet Commonly known as: NORVASC Take 1 tablet (5 mg total) by mouth daily.   cloNIDine 0.2 MG tablet Commonly known as: CATAPRES TAKE 1 TABLET(0.2 MG) BY MOUTH TWICE DAILY   glucose blood test strip Use as instructed   insulin aspart protamine - aspart (70-30) 100 UNIT/ML FlexPen Commonly known as: NovoLOG Mix 70/30 FlexPen Inject 0.16 mLs (16 Units total) into the skin 2 (two) times daily.   Insulin Pen Needle 32G X 4 MM Misc 2x daily   methylPREDNISolone 4 MG Tbpk tablet Commonly known as: MEDROL DOSEPAK follow package directions   onetouch ultrasoft lancets Use as instructed       Follow-up Information    [name], DO. Schedule an appointment as soon as possible for a visit in 1 week(s).   Specialty: Family Medicine Contact information: [address] [place] [address] [phone]           Major procedures and Radiology Reports - PLEASE review detailed and final reports thoroughly  -        Dg Chest Portable 1 View  Result Date: 05/13/2019 CLINICAL DATA:  Fever, shortness of breath and increasing weakness over the past 2 weeks. EXAM: PORTABLE CHEST 1 VIEW COMPARISON:  Single-view of the chest 08/20/2017. PA and lateral chest 04/04/2017. FINDINGS: The lungs are clear. Heart size is normal. No pneumothorax or pleural effusion. No acute or focal bony  abnormality. IMPRESSION: Negative chest. Electronically Signed   By: Inge Rise M.D.   On: 05/13/2019 10:10    Micro Results     Recent Results (from the past 240 hour(s))  SARS Coronavirus 2 (CEPHEID- Performed in [facility] hospital lab), Hosp Order     Status: Abnormal   Collection Time: 05/13/19  1:33 PM  Specimen: Nasopharyngeal Swab  Result Value Ref Range Status   SARS Coronavirus 2 POSITIVE (A) NEGATIVE Final    Comment: RESULT CALLED TO, READ BACK BY AND VERIFIED WITH: Tsosie Billing [id number] @ [place] (NOTE) If result is NEGATIVE SARS-CoV-2 target nucleic acids are NOT DETECTED. The SARS-CoV-2 RNA is generally detectable in upper and lower  respiratory specimens during the acute phase of infection. The lowest  concentration of SARS-CoV-2 viral copies this assay can detect is 250  copies / mL. A negative result does not preclude SARS-CoV-2 infection  and should not be used as the sole basis for treatment or other  patient management decisions.  A negative result may occur with  improper specimen collection / handling, submission of specimen other  than nasopharyngeal swab, presence of viral mutation(s) within the  areas targeted by this assay, and inadequate number of viral copies  (<250 copies / mL). A negative result must be combined with clinical  observations, patient history, and epidemiological information. If result is POSITIVE SARS-CoV-2 target nucleic acids are DET ECTED. The SARS-CoV-2 RNA is generally detectable in upper and lower  respiratory specimens during the acute phase of infection.  Positive  results are indicative of active infection with SARS-CoV-2.  Clinical  correlation with patient history and other diagnostic information is  necessary to determine patient infection status.  Positive results do  not rule out bacterial infection or co-infection with other viruses. If result is PRESUMPTIVE POSTIVE SARS-CoV-2 nucleic acids MAY BE  PRESENT.   A presumptive positive result was obtained on the submitted specimen  and confirmed on repeat testing.  While 2019 novel coronavirus  (SARS-CoV-2) nucleic acids may be present in the submitted sample  additional confirmatory testing may be necessary for epidemiological  and / or clinical management purposes  to differentiate between  SARS-CoV-2 and other Sarbecovirus currently known to infect humans.  If clinically indicated additional testing with an alternate test  methodology ([place]) is advised. The SARS-CoV-2 RNA is generally  detectable in upper and lower respiratory specimens during the acute  phase of infection. The expected result is Negative. Fact Sheet for Patients:  StrictlyIdeas.no Fact Sheet for Healthcare Providers: BankingDealers.co.za This test is not yet approved or cleared by the [place] FDA and has been authorized for detection and/or diagnosis of SARS-CoV-2 by FDA under an Emergency Use Authorization (EUA).  This EUA will remain in effect (meaning this test can be used) for the duration of the COVID-19 declaration under Section 564(b)(1) of the Act, 21 U.S.C. section 360bbb-3(b)(1), unless the authorization is terminated or revoked sooner. Performed at [facility], [place] [address], [place], [place] [address]     Today   Subjective    Myrtice Lauth today has no headache,no chest abdominal pain,no new weakness tingling or numbness, feels much better wants to go home today.     Objective   Blood pressure (!) 138/92, pulse 86, temperature 98 F (36.7 C), temperature source Oral, resp. rate 17, height 5\' 3"  (1.6 m), weight 95 kg, SpO2 95 %.   Intake/Output Summary (Last 24 hours) at 05/14/2019 1033 Last data filed at 05/13/2019 2105 Gross per 24 hour  Intake 1469 ml  Output -  Net 1469 ml    Exam  Awake Alert, Oriented x 3, No new F.N deficits, Normal affect  [place].AT,PERRAL Supple Neck,No JVD, No cervical lymphadenopathy appriciated.  Symmetrical Chest wall movement, Good air movement bilaterally, CTAB RRR,No Gallops,Rubs or new Murmurs, No Parasternal  Heave +ve B.Sounds, Abd Soft, Non tender, No organomegaly appriciated, No rebound -guarding or rigidity. No Cyanosis, Clubbing or edema, No new Rash or bruise   Data Review   CBC w Diff:  Lab Results  Component Value Date   WBC 2.6 (L) 05/14/2019   HGB 11.4 (L) 05/14/2019   HCT 34.8 (L) 05/14/2019   PLT 158 05/14/2019   LYMPHOPCT 22 05/14/2019   MONOPCT 5 05/14/2019   EOSPCT 0 05/14/2019   BASOPCT 0 05/14/2019    CMP:  Lab Results  Component Value Date   NA 140 05/14/2019   K 3.5 05/14/2019   CL 102 05/14/2019   CO2 29 05/14/2019   BUN 9 05/14/2019   CREATININE 1.01 (H) 05/14/2019   PROT 7.1 05/14/2019   ALBUMIN 3.1 (L) 05/14/2019   BILITOT 0.5 05/14/2019   ALKPHOS 62 05/14/2019   AST 30 05/14/2019   ALT 25 05/14/2019  .  COVID-19 Labs  Recent Labs    05/14/19 0320  DDIMER 0.34  FERRITIN 409*  LDH 208*  CRP 5.0*    Lab Results  Component Value Date   SARSCOV2NAA POSITIVE (A) 05/13/2019    Total Time in preparing paper work, data evaluation and todays exam - 63 minutes  Lala Lund M.D on 05/14/2019 at 10:33 AM  [facility]   Office  [phone]

## 2019-05-14 NOTE — Evaluation (Signed)
Occupational Therapy Evaluation Patient Details Name: Heather Walker MRN: [id number] DOB: 08/08/1961 Today's Date: 05/14/2019    History of Present Illness 58 y.o. female admitted on 05/13/19 for fever, HA, diarrhea.  Found to have COVID 19, electrolyte abnormalities (hypokalemia).  Pt with significant PMH of HTN, DM.     Clinical Impression   PTA, pt was living with her husband and was independent. Pt currently requiring Min Guard A for ADLs in standing, Min Guard-Min A for LB ADLs, and Min Guard A for functional mobility with RW. Pt presenting with poor activity tolerance and fatigues quickly with simple ADLs. Pt requiring standing rest breaks with mobility from bed to bathroom. Pt then unable to maintain standing at sink for oral care and required to sit for task. Pt would benefit from further acute OT to facilitate safe dc. Recommend dc to home with HHOT for further OT to optimize safety, independence with ADLs, and return to PLOF.      Follow Up Recommendations  Home health OT;Supervision/Assistance - 24 hour    Equipment Recommendations  Other (comment)(RW)    Recommendations for Other Services PT consult     Precautions / Restrictions Precautions Precautions: Fall Precaution Comments: unsteady on her feet      Mobility Bed Mobility Overal bed mobility: Needs Assistance Bed Mobility: Supine to Sit     Supine to sit: Min assist Sit to supine: Min guard   General bed mobility comments: Pt requesting assist to pull trunk into sitting  Transfers Overall transfer level: Needs assistance Equipment used: Rolling walker (2 wheeled);1 person hand held assist Transfers: Sit to/from Stand Sit to Stand: Min guard         General transfer comment: Min Guard A for safety. Cues for hand palcement    Balance Overall balance assessment: Needs assistance Sitting-balance support: Feet supported;Bilateral upper extremity supported Sitting balance-Leahy Scale: Fair Sitting  balance - Comments: resting on both arms in sitting likely due to weakness and fatigue   Standing balance support: Bilateral upper extremity supported;Single extremity supported Standing balance-Leahy Scale: Fair Standing balance comment: needs external support in standing.                           ADL either performed or assessed with clinical judgement   ADL Overall ADL's : Needs assistance/impaired Eating/Feeding: Independent;Sitting   Grooming: Set up;Supervision/safety;Sitting;Oral care Grooming Details (indicate cue type and reason): Pt attempting to perform oral care at sink while standing, but fatigued and required to sit for task Upper Body Bathing: Supervision/ safety;Set up;Sitting   Lower Body Bathing: Min guard;Sit to/from stand   Upper Body Dressing : Set up;Supervision/safety;Sitting   Lower Body Dressing: Minimal assistance;Sit to/from stand   Toilet Transfer: Min guard;Ambulation;BSC           Functional mobility during ADLs: Min guard;Rolling walker General ADL Comments: Pt demonstrating decreased activity tolerance and poor strength. Pt requiring increased time during BADLs due to fatigue.      Vision         Perception     Praxis      Pertinent Vitals/Pain Pain Assessment: Faces Faces Pain Scale: Hurts little more Pain Location: Back Pain Descriptors / Indicators: Grimacing;Guarding Pain Intervention(s): Monitored during session;Limited activity within patient's tolerance;Repositioned     Hand Dominance Right   Extremity/Trunk Assessment Upper Extremity Assessment Upper Extremity Assessment: Generalized weakness   Lower Extremity Assessment Lower Extremity Assessment: Defer to PT evaluation   Cervical /  Trunk Assessment Cervical / Trunk Assessment: Normal   Communication Communication Communication: No difficulties   Cognition Arousal/Alertness: Awake/alert Behavior During Therapy: Flat affect Overall Cognitive Status:  Within Functional Limits for tasks assessed                                 General Comments: Pt doesn't say much and when she does it is low tone, mumbling.  Smiling occasionally at OT.    General Comments  SpO2 >90% on RA. Pt reporting dizziness with mobility. BP 122/85    Exercises     Shoulder Instructions      Home Living Family/patient expects to be discharged to:: Private residence Living Arrangements: Spouse/significant other Available Help at Discharge: Family;Available 24 hours/day Type of Home: Apartment Home Access: Stairs to enter Entrance Stairs-Number of Steps: 3 Entrance Stairs-Rails: None Home Layout: One level     Bathroom Shower/Tub: Teacher, early years/pre: Standard     Home Equipment: Civil engineer, contracting;Toilet riser          Prior Functioning/Environment Level of Independence: Independent        Comments: Enjoys cooking        OT Problem List: Decreased strength;Decreased range of motion;Decreased activity tolerance;Impaired balance (sitting and/or standing);Decreased safety awareness;Decreased knowledge of use of DME or AE;Decreased knowledge of precautions;Pain      OT Treatment/Interventions: Self-care/ADL training;Therapeutic exercise;Energy conservation;DME and/or AE instruction;Therapeutic activities;Patient/family education    OT Goals(Current goals can be found in the care plan section) Acute Rehab OT Goals Patient Stated Goal: to feel better, get stronger OT Goal Formulation: With patient Time For Goal Achievement: 05/28/19 Potential to Achieve Goals: Good  OT Frequency: Min 3X/week   Barriers to D/C:            Co-evaluation              AM-PAC OT "6 Clicks" Daily Activity     Outcome Measure Help from another person eating meals?: None Help from another person taking care of personal grooming?: A Little Help from another person toileting, which includes using toliet, bedpan, or urinal?: A  Little Help from another person bathing (including washing, rinsing, drying)?: A Little Help from another person to put on and taking off regular upper body clothing?: A Little Help from another person to put on and taking off regular lower body clothing?: A Little 6 Click Score: 19   End of Session Equipment Utilized During Treatment: Rolling walker Nurse Communication: Mobility status  Activity Tolerance: Patient limited by fatigue Patient left: in chair;with call bell/phone within reach  OT Visit Diagnosis: Unsteadiness on feet (R26.81);Other abnormalities of gait and mobility (R26.89);Muscle weakness (generalized) (M62.81)                Time: 0086-7619 OT Time Calculation (min): 25 min Charges:  OT General Charges $OT Visit: 1 Visit OT Evaluation $OT Eval Low Complexity: 1 Low OT Treatments $Self Care/Home Management : 8-22 mins  Anyely Cunning MSOT, OTR/L Acute Rehab Pager: [phone] Office: [place] 05/14/2019, 12:57 PM

## 2019-05-14 NOTE — Care Management CC44 (Signed)
Condition Code 44 Documentation Completed  Patient Details  Name: Heather Walker MRN: [id number] Date of Birth: 09-30-1961   Condition Code 44 given:  Yes Patient signature on Condition Code 44 notice:  Yes Documentation of 2 MD's agreement:  Yes Code 44 added to claim:  Yes    Ninfa Meeker, RN 05/14/2019, 11:04 AM

## 2019-05-15 DIAGNOSIS — U071 COVID-19: Secondary | ICD-10-CM | POA: Diagnosis not present

## 2019-05-16 ENCOUNTER — Telehealth: Payer: Self-pay | Admitting: *Deleted

## 2019-05-16 NOTE — Telephone Encounter (Signed)
Pt reports in hospital follow up call she is POSITiVE for Covid 19.

## 2019-05-16 NOTE — Telephone Encounter (Addendum)
Transition Care Management Follow-up Telephone Call   Date discharged? 05/14/19   How have you been since you were released from the hospital? Getting better per pt.   Do you understand why you were in the hospital? Yes. I have the Corona virus.    Do you understand the discharge instructions? yes   Where were you discharged to? home   Items Reviewed:  Medications reviewed: Pt states no changes to medications  Allergies reviewed: pt states no new allergies  Dietary changes reviewed: yes  Referrals reviewed: yes   Functional Questionnaire:   Activities of Daily Living (ADLs):   She states they are independent in the following: ambulation, bathing and hygiene, feeding, continence, grooming, toileting and dressing. Uses cane.  States they require assistance with the following: n/a   Any transportation issues/concerns?: no   Any patient concerns? no   Confirmed importance and date/time of follow-up visits scheduled yes  Provider Appointment booked with 05/20/19 @1120   Confirmed with patient if condition begins to worsen call PCP or go to the ER.  Patient was given the office number and encouraged to call back with question or concerns.  : yes

## 2019-05-20 ENCOUNTER — Other Ambulatory Visit: Payer: Self-pay

## 2019-05-20 ENCOUNTER — Ambulatory Visit (INDEPENDENT_AMBULATORY_CARE_PROVIDER_SITE_OTHER): Payer: Medicare HMO | Admitting: Family Medicine

## 2019-05-20 ENCOUNTER — Encounter: Payer: Self-pay | Admitting: Family Medicine

## 2019-05-20 DIAGNOSIS — Z8619 Personal history of other infectious and parasitic diseases: Secondary | ICD-10-CM

## 2019-05-20 DIAGNOSIS — R0602 Shortness of breath: Secondary | ICD-10-CM | POA: Diagnosis not present

## 2019-05-20 DIAGNOSIS — Z8616 Personal history of COVID-19: Secondary | ICD-10-CM

## 2019-05-20 DIAGNOSIS — I10 Essential (primary) hypertension: Secondary | ICD-10-CM | POA: Diagnosis not present

## 2019-05-20 DIAGNOSIS — E1151 Type 2 diabetes mellitus with diabetic peripheral angiopathy without gangrene: Secondary | ICD-10-CM | POA: Diagnosis not present

## 2019-05-20 DIAGNOSIS — E1165 Type 2 diabetes mellitus with hyperglycemia: Secondary | ICD-10-CM | POA: Diagnosis not present

## 2019-05-20 DIAGNOSIS — IMO0002 Reserved for concepts with insufficient information to code with codable children: Secondary | ICD-10-CM

## 2019-05-20 DIAGNOSIS — U071 COVID-19: Secondary | ICD-10-CM

## 2019-05-20 MED ORDER — BLOOD GLUCOSE MONITOR KIT: PACK | 0 refills | Status: DC

## 2019-05-20 NOTE — Assessment & Plan Note (Signed)
Improving Pt is home Recheck labs and cxr

## 2019-05-20 NOTE — Assessment & Plan Note (Signed)
Well controlled, no changes to meds. Encouraged heart healthy diet such as the DASH diet and exercise as tolerated.  °

## 2019-05-20 NOTE — Assessment & Plan Note (Signed)
Per endo Pt will call for f/u app

## 2019-05-20 NOTE — Progress Notes (Signed)
Virtual Visit via Video Note  I connected with Heather Walker on 05/20/19 at 11:20 AM EDT by a video enabled telemedicine application and verified that I am speaking with the correct person using two identifiers.  Location: Patient: home Provider: office   I discussed the limitations of evaluation and management by telemedicine and the availability of in person appointments. The patient expressed understanding and agreed to proceed.  History of Present Illness: Pt is home recovering from hospitalization for +covid  -- 7/27-7/28    She is still weak but her sob is much better.   Pt has no new complaints Observations/Objective: .no vitals obtained today Pt states she is afebrile  Pt is tired but in NAD Assessment and Plan: 1. SOB (shortness of breath) +covid  - DG Chest 2 View; Future - CBC with Differential/Platelet; Future - Comprehensive metabolic panel; Future  2. History of 2019 novel coronavirus disease (COVID-19) improving - CBC with Differential/Platelet; Future - Comprehensive metabolic panel; Future  3. COVID-19 virus infection   4. DM (diabetes mellitus) type II uncontrolled, periph vascular disorder (HCC) Pt needs a new machine F/u endo - blood glucose meter kit and supplies KIT; Dispense based on patient and insurance preference. Use up to four times daily as directed. (FOR ICD-9 250.00, 250.01).  Dispense: 1 each; Refill: 0  5. Essential hypertension Well controlled, no changes to meds. Encouraged heart healthy diet such as the DASH diet and exercise as tolerated.    Follow Up Instructions:    I discussed the assessment and treatment plan with the patient. The patient was provided an opportunity to ask questions and all were answered. The patient agreed with the plan and demonstrated an understanding of the instructions.   The patient was advised to call back or seek an in-person evaluation if the symptoms worsen or if the condition fails to improve as  anticipated.  I provided 15 minutes of non-face-to-face time during this encounter.   [name], DO

## 2019-05-22 ENCOUNTER — Other Ambulatory Visit: Payer: Self-pay

## 2019-05-22 ENCOUNTER — Other Ambulatory Visit: Payer: Medicare HMO

## 2019-07-22 ENCOUNTER — Encounter: Payer: Medicare HMO | Admitting: Family Medicine

## 2019-08-20 ENCOUNTER — Other Ambulatory Visit: Payer: Self-pay | Admitting: Family Medicine

## 2019-08-20 DIAGNOSIS — Z1231 Encounter for screening mammogram for malignant neoplasm of breast: Secondary | ICD-10-CM

## 2019-08-22 ENCOUNTER — Ambulatory Visit
Admission: RE | Admit: 2019-08-22 | Discharge: 2019-08-22 | Disposition: A | Discharge status: Home / Self Care | Payer: Medicare HMO | Source: Ambulatory Visit | Attending: Family Medicine | Admitting: Family Medicine

## 2019-08-22 ENCOUNTER — Other Ambulatory Visit: Payer: Self-pay

## 2019-08-22 DIAGNOSIS — Z1231 Encounter for screening mammogram for malignant neoplasm of breast: Secondary | ICD-10-CM

## 2019-08-23 ENCOUNTER — Telehealth: Payer: Self-pay | Admitting: *Deleted

## 2019-08-23 NOTE — Telephone Encounter (Signed)
According to hospital d/c she is NOT supposed to be taking it--- is she checking her bp?

## 2019-08-23 NOTE — Telephone Encounter (Signed)
[facility] faxed over request for triamterene/hctz 75/50mg .  When she was in the hospital it was taken off at discharge and not on med list.  Her last fill was 05/27/19 so apparently she has been taking it.  Do you still want her to continue?

## 2019-08-26 NOTE — Telephone Encounter (Signed)
Left message on machine to call back  

## 2019-08-27 NOTE — Telephone Encounter (Signed)
Left message on machine to call back  

## 2019-08-28 ENCOUNTER — Other Ambulatory Visit: Payer: Self-pay | Admitting: Family Medicine

## 2019-08-28 DIAGNOSIS — I10 Essential (primary) hypertension: Secondary | ICD-10-CM

## 2019-08-28 DIAGNOSIS — E785 Hyperlipidemia, unspecified: Secondary | ICD-10-CM

## 2019-08-28 DIAGNOSIS — K219 Gastro-esophageal reflux disease without esophagitis: Secondary | ICD-10-CM

## 2019-08-28 NOTE — Telephone Encounter (Signed)
Patient never stopped taking the the medication and she has not checked her blood pressure.  She asked if we would know the reason of why they would have stopped it?

## 2019-08-29 MED ORDER — TRIAMTERENE-HCTZ 75-50 MG PO TABS: 1.0000 | ORAL_TABLET | Freq: Every day | ORAL | 1 refills | Status: DC

## 2019-08-29 NOTE — Telephone Encounter (Signed)
I do not know the reason----  Ok to refill but pt should have ov to f/u bp --- it can be virtual if she has a way to check her bp

## 2019-08-29 NOTE — Telephone Encounter (Signed)
Patient stated that she will be able to check blood pressure.  Virtual appointment set up for next week.

## 2019-09-03 ENCOUNTER — Ambulatory Visit (INDEPENDENT_AMBULATORY_CARE_PROVIDER_SITE_OTHER): Payer: Medicare HMO | Admitting: Family Medicine

## 2019-09-03 ENCOUNTER — Encounter: Payer: Self-pay | Admitting: Family Medicine

## 2019-09-03 ENCOUNTER — Other Ambulatory Visit: Payer: Self-pay

## 2019-09-03 ENCOUNTER — Other Ambulatory Visit: Payer: Self-pay | Admitting: *Deleted

## 2019-09-03 DIAGNOSIS — M19012 Primary osteoarthritis, left shoulder: Secondary | ICD-10-CM | POA: Diagnosis not present

## 2019-09-03 DIAGNOSIS — I10 Essential (primary) hypertension: Secondary | ICD-10-CM | POA: Diagnosis not present

## 2019-09-03 MED ORDER — ACCU-CHEK GUIDE VI STRP: ORAL_STRIP | 1 refills | Status: DC

## 2019-09-03 MED ORDER — AMLODIPINE BESYLATE 5 MG PO TABS: 5.0000 mg | ORAL_TABLET | Freq: Every day | ORAL | 3 refills | Status: DC

## 2019-09-03 NOTE — Assessment & Plan Note (Signed)
Pt to take tylenol arthritis Consider referral to ortho of no improvement Will evaluate when she comes to office in 2 weeks

## 2019-09-03 NOTE — Assessment & Plan Note (Signed)
Poorly controlled will alter medications, encouraged DASH diet, minimize caffeine and obtain adequate sleep. Report concerning symptoms and follow up as directed and as needed 

## 2019-09-03 NOTE — Progress Notes (Signed)
Virtual Visit via Video Note  I connected with Heather Walker on 09/03/19 at  3:40 PM EST by a video enabled telemedicine application and verified that I am speaking with the correct person using two identifiers.  Location: Patient: home  Provider: office    I discussed the limitations of evaluation and management by telemedicine and the availability of in person appointments. The patient expressed understanding and agreed to proceed.  History of Present Illness: Pt is home.   bp running high at home 170/145 180/123   No chest pain no sob, no headaches  She is c/o L shoulder pain but this is not new-- she knows she has arthritis in that shoulder  Observations/Objective: See BP above Pt is in NAD  Assessment and Plan: .1. Essential hypertension Poorly controlled will alter medications, encouraged DASH diet, minimize caffeine and obtain adequate sleep. Report concerning symptoms and follow up as directed and as needed - amLODipine (NORVASC) 5 MG tablet; Take 1 tablet (5 mg total) by mouth daily.  Dispense: 30 tablet; Refill: 3 con't clonidine and maxzide 2. Primary osteoarthritis of left shoulder Pt to take tylenol arthritis Consider referral to ortho of no improvement Will evaluate when she comes to office in 2 weeks     Follow Up Instructions:    I discussed the assessment and treatment plan with the patient. The patient was provided an opportunity to ask questions and all were answered. The patient agreed with the plan and demonstrated an understanding of the instructions.   The patient was advised to call back or seek an in-person evaluation if the symptoms worsen or if the condition fails to improve as anticipated.  I provided 15 minutes of non-face-to-face time during this encounter.   [name], DO

## 2019-09-04 ENCOUNTER — Other Ambulatory Visit: Payer: Self-pay | Admitting: Family Medicine

## 2019-09-04 ENCOUNTER — Telehealth: Payer: Self-pay

## 2019-09-04 DIAGNOSIS — E785 Hyperlipidemia, unspecified: Secondary | ICD-10-CM

## 2019-09-04 MED ORDER — ACCU-CHEK GUIDE VI STRP: ORAL_STRIP | 1 refills | Status: DC

## 2019-09-04 NOTE — Telephone Encounter (Signed)
Copied from [place] [id number]. Topic: General - Inquiry >> Sep 04, 2019  8:46 AM Mathis Bud wrote: Reason for CRM: Shyda from [facility] [facility] called regarding patients medication glucose blood (ACCU-CHEK GUIDE) test strip Pharmacy states they need more information regarding the prescription. Call back [phone]

## 2019-09-04 NOTE — Telephone Encounter (Signed)
New rx sent in with directions and dx code

## 2019-09-05 ENCOUNTER — Other Ambulatory Visit: Payer: Self-pay | Admitting: *Deleted

## 2019-09-05 DIAGNOSIS — E1122 Type 2 diabetes mellitus with diabetic chronic kidney disease: Secondary | ICD-10-CM

## 2019-09-05 DIAGNOSIS — I10 Essential (primary) hypertension: Secondary | ICD-10-CM

## 2019-09-05 DIAGNOSIS — IMO0001 Reserved for inherently not codable concepts without codable children: Secondary | ICD-10-CM

## 2019-09-05 MED ORDER — ACCU-CHEK FASTCLIX LANCETS MISC: 1 refills | Status: DC

## 2019-09-05 MED ORDER — CLONIDINE HCL 0.2 MG PO TABS: ORAL_TABLET | ORAL | 1 refills | Status: DC

## 2019-09-05 MED ORDER — ACCU-CHEK GUIDE ME W/DEVICE KIT: 1.0000 | PACK | Freq: Two times a day (BID) | 0 refills | Status: DC

## 2019-10-17 ENCOUNTER — Telehealth: Payer: Self-pay | Admitting: *Deleted

## 2019-10-17 DIAGNOSIS — G4700 Insomnia, unspecified: Secondary | ICD-10-CM

## 2019-10-17 MED ORDER — TRAZODONE HCL 50 MG PO TABS: 25.0000 mg | ORAL_TABLET | Freq: Every evening | ORAL | 0 refills | Status: DC | PRN

## 2019-10-17 NOTE — Telephone Encounter (Signed)
Notified pt. 

## 2019-10-17 NOTE — Telephone Encounter (Signed)
Received request from [facility] for Trazodone 50mg . Take 1/2 to 1 tablet at bedtime as needed for sleep. Medication no longer on current med list but pt states that she still takes medication and to her knowledge it was not stopped by PCP.  Please advise?

## 2019-10-17 NOTE — Telephone Encounter (Signed)
Chart reviewed, apparently med was a stopped when she was at the hospital. I do not see a contraindication for trazodone.  Advise patient, prescription sent for 1 month, further refills per PCP

## 2019-10-21 ENCOUNTER — Other Ambulatory Visit: Payer: Self-pay

## 2019-10-21 ENCOUNTER — Ambulatory Visit (INDEPENDENT_AMBULATORY_CARE_PROVIDER_SITE_OTHER): Payer: Medicare HMO | Admitting: Family Medicine

## 2019-10-21 ENCOUNTER — Encounter: Payer: Self-pay | Admitting: Family Medicine

## 2019-10-21 DIAGNOSIS — IMO0001 Reserved for inherently not codable concepts without codable children: Secondary | ICD-10-CM

## 2019-10-21 DIAGNOSIS — I129 Hypertensive chronic kidney disease with stage 1 through stage 4 chronic kidney disease, or unspecified chronic kidney disease: Secondary | ICD-10-CM | POA: Diagnosis not present

## 2019-10-21 DIAGNOSIS — E1165 Type 2 diabetes mellitus with hyperglycemia: Secondary | ICD-10-CM | POA: Diagnosis not present

## 2019-10-21 DIAGNOSIS — I10 Essential (primary) hypertension: Secondary | ICD-10-CM

## 2019-10-21 DIAGNOSIS — E1122 Type 2 diabetes mellitus with diabetic chronic kidney disease: Secondary | ICD-10-CM | POA: Diagnosis not present

## 2019-10-21 MED ORDER — CLONIDINE HCL 0.2 MG PO TABS: ORAL_TABLET | ORAL | 1 refills | Status: DC

## 2019-10-21 NOTE — Progress Notes (Signed)
Virtual Visit via Video Note  I connected with Heather Walker on 10/21/19 at  1:00 PM EST by a video enabled telemedicine application and verified that I am speaking with the correct person using two identifiers.  Location: Patient: home alone  Provider: office    I discussed the limitations of evaluation and management by telemedicine and the availability of in person appointments. The patient expressed understanding and agreed to proceed.  History of Present Illness: Pt is home with no complaints.  She needs refills and f/u bp and labs    Current Outpatient Medications on File Prior to Visit  Medication Sig Dispense Refill  . Accu-Chek FastClix Lancets MISC Use as directed twice a day.  Dx Code: E11.9 100 each 1  . amLODipine (NORVASC) 5 MG tablet Take 1 tablet (5 mg total) by mouth daily. 30 tablet 3  . Blood Glucose Monitoring Suppl (ACCU-CHEK GUIDE ME) w/Device KIT 1 each by Does not apply route 2 (two) times daily. Dx code E11.9 1 kit 0  . glucose blood (ACCU-CHEK GUIDE) test strip Use as instructed twice a day.  Dx Code: E11.9 100 each 1  . insulin aspart protamine - aspart (NOVOLOG MIX 70/30 FLEXPEN) (70-30) 100 UNIT/ML FlexPen Inject 0.16 mLs (16 Units total) into the skin 2 (two) times daily. 15 mL 11  . Insulin Pen Needle 32G X 4 MM MISC 2x daily 100 each 11  . traZODone (DESYREL) 50 MG tablet Take 0.5-1 tablets (25-50 mg total) by mouth at bedtime as needed. for sleep 30 tablet 0  . triamterene-hydrochlorothiazide (MAXZIDE) 75-50 MG tablet Take 1 tablet by mouth daily. 90 tablet 1   No current facility-administered medications on file prior to visit.   Allergies  Allergen Reactions  . Crestor [Rosuvastatin Calcium] Anaphylaxis  . Metformin And Related Diarrhea    Nausea and diarrhea  . Atorvastatin Other (See Comments)    Neck stiffness  . Codeine Itching    Observations/Objective: No vitals obtained but pt states bp has been good She will call with bp readings Pt  is in NAD No sob  Assessment and Plan: 1. Essential hypertension Well controlled, no changes to meds. Encouraged heart healthy diet such as the DASH diet and exercise as tolerated.  - Lipid panel; Future - Comprehensive metabolic panel; Future - Microalbumin / creatinine urine ratio; Future - cloNIDine (CATAPRES) 0.2 MG tablet; TAKE 1 TABLET(0.2 MG) BY MOUTH TWICE DAILY  Dispense: 180 tablet; Refill: 1  2. Uncontrolled type 2 diabetes mellitus with hyperglycemia (HCC) Check labs  - Lipid panel; Future - Comprehensive metabolic panel; Future - Microalbumin / creatinine urine ratio; Future - Hemoglobin A1c; Future  3. Type 2 DM with CKD and hypertension (HCC) Check labs  - cloNIDine (CATAPRES) 0.2 MG tablet; TAKE 1 TABLET(0.2 MG) BY MOUTH TWICE DAILY  Dispense: 180 tablet; Refill: 1   Follow Up Instructions:    I discussed the assessment and treatment plan with the patient. The patient was provided an opportunity to ask questions and all were answered. The patient agreed with the plan and demonstrated an understanding of the instructions.   The patient was advised to call back or seek an in-person evaluation if the symptoms worsen or if the condition fails to improve as anticipated.  I provided 15 minutes of non-face-to-face time during this encounter.   [name], DO

## 2019-12-02 ENCOUNTER — Other Ambulatory Visit: Payer: Self-pay

## 2019-12-04 ENCOUNTER — Ambulatory Visit (INDEPENDENT_AMBULATORY_CARE_PROVIDER_SITE_OTHER): Payer: Medicare HMO | Admitting: Internal Medicine

## 2019-12-04 ENCOUNTER — Other Ambulatory Visit: Payer: Self-pay

## 2019-12-04 ENCOUNTER — Encounter: Payer: Self-pay | Admitting: Internal Medicine

## 2019-12-04 VITALS — BP 124/82 | HR 82 | Temp 98.2°F | Ht 63.0 in | Wt 198.4 lb

## 2019-12-04 DIAGNOSIS — E1165 Type 2 diabetes mellitus with hyperglycemia: Secondary | ICD-10-CM | POA: Diagnosis not present

## 2019-12-04 LAB — POCT GLYCOSYLATED HEMOGLOBIN (HGB A1C): Hemoglobin A1C: 12 % — AB (ref 4.0–5.6)

## 2019-12-04 LAB — GLUCOSE, POCT (MANUAL RESULT ENTRY): POC Glucose: 356 mg/dl — AB (ref 70–99)

## 2019-12-04 LAB — MICROALBUMIN / CREATININE URINE RATIO
Creatinine,U: 42.5 mg/dL
Microalb Creat Ratio: 1.6 mg/g (ref 0.0–30.0)
Microalb, Ur: <0.7 mg/dL (ref 0.0–1.9)

## 2019-12-04 MED ORDER — INSULIN PEN NEEDLE 32G X 4 MM MISC: 1.0000 | Freq: Two times a day (BID) | 6 refills | Status: DC

## 2019-12-04 MED ORDER — NOVOLIN 70/30 FLEXPEN (70-30) 100 UNIT/ML ~~LOC~~ SUPN: 24.0000 [IU] | PEN_INJECTOR | Freq: Two times a day (BID) | SUBCUTANEOUS | 6 refills | Status: DC

## 2019-12-04 MED ORDER — ONETOUCH VERIO VI STRP: 1.0000 | ORAL_STRIP | Freq: Two times a day (BID) | 6 refills | Status: DC

## 2019-12-04 NOTE — Patient Instructions (Signed)
-   Start Novolin 24 units before  Breakfast and before Supper  - Its preferred to take it 20-30 minutes before you eat a meal    - Check sugar before breakfast and supper     HOW TO TREAT LOW BLOOD SUGARS (Blood sugar LESS THAN 70 MG/DL)  Please follow the RULE OF 15 for the treatment of hypoglycemia treatment (when your (blood sugars are less than 70 mg/dL)    STEP 1: Take 15 grams of carbohydrates when your blood sugar is low, which includes:   3-4 GLUCOSE TABS  OR  3-4 OZ OF JUICE OR REGULAR SODA OR  ONE TUBE OF GLUCOSE GEL     STEP 2: RECHECK blood sugar in 15 MINUTES STEP 3: If your blood sugar is still low at the 15 minute recheck --> then, go back to STEP 1 and treat AGAIN with another 15 grams of carbohydrates.

## 2019-12-04 NOTE — Progress Notes (Signed)
Name: Heather Walker  Age/ Sex: 59 y.o., female   MRN/ DOB: [id number], 22-Jan-1961     PCP: [name], DO   Reason for Endocrinology Evaluation: Type 2 Diabetes Mellitus  Initial Endocrine Consultative Visit: 09/26/2018    PATIENT IDENTIFIER: [name] is a 59 y.o. female with a past medical history of HTN, T2DM, GERD and Dyslipidemia . The patient has followed with Endocrinology clinic since 09/26/2018 for consultative assistance with management of her diabetes.  DIABETIC HISTORY:  Heather Walker was diagnosed with T2DM in 2015. She has reported GI intolerance to Metformin. She was unable to try Jardiance due to insurance issues. Heather Walker gave her neck pain. Her hemoglobin A1c has ranged from 8.5%  in 2016, peaking at 9.8% in 2019.   On her initial visit to our clinic her A1c 9.8%. She was not taking any of her prescribed medications. We stopped Glipizide and started Novolog mix.  She has declined a CDE referral    SUBJECTIVE:   During the last visit (03/26/2019): A1c was 9.8%. Stopped Glipizide and started [employer].     Today (12/05/2019): Heather Walker is here for a follow up on diabetes management.She has not been to our office in 7 months. She does not check her sugars, she has not been taking her insulin either. Otherwise, the patient has not required any recent emergency interventions for hypoglycemia and has not had recent hospitalizations secondary to hyper or hypoglycemic episodes.    Eats 2 meals a day, snacks 1x a day. Drinks occasional sugar sweetened beverages.    ROS: As per HPI and as detailed below: Review of Systems  HENT: Negative for congestion and sore throat.   Respiratory: Negative for cough.   Cardiovascular: Negative for chest pain.  Gastrointestinal: Negative for diarrhea and nausea.  Genitourinary: Positive for frequency.  Endo/Heme/Allergies: Negative for polydipsia.      HOME DIABETES REGIMEN:   Novolog Mix (70/30) -not  taking   METER DOWNLOAD SUMMARY: Did not bring    DIABETIC COMPLICATIONS: Microvascular complications:   Denies: retinopathy , neuropathy  Last eye exam: Completed 01/2019  Macrovascular complications:   Denies: CAD, PVD, CVA  HISTORY:  Past Medical History:  Past Medical History:  Diagnosis Date  . Arthritis   . Asthma   . Depression   . Diabetes mellitus without complication ([place])   . Heart murmur   . Hypertension    Past Surgical History:  Past Surgical History:  Procedure Laterality Date  . ABDOMINAL HYSTERECTOMY    . APPENDECTOMY    . BLADDER SUSPENSION    . CESAREAN SECTION     3 previous  . TEE WITHOUT CARDIOVERSION N/A 08/24/2017   Procedure: TRANSESOPHAGEAL ECHOCARDIOGRAM (TEE);  Surgeon: Larey Dresser, MD;  Location: [facility] ENDOSCOPY;  Service: Cardiovascular;  Laterality: N/A;  . TUBAL LIGATION      Social History:  reports that she has never smoked. She has never used smokeless tobacco. She reports that she does not drink alcohol or use drugs. Family History:  Family History  Problem Relation Age of Onset  . Hypertension Mother   . Asthma Mother   . Hypertension Sister   . Asthma Sister   . Diabetes Sister   . Hypertension Maternal Grandmother   . Heart defect Sister   . Asthma Sister   . Aneurysm Sister   . Asthma Sister      HOME MEDICATIONS: Allergies as of 12/04/2019      Reactions  Crestor [rosuvastatin Calcium] Anaphylaxis   Metformin And Related Diarrhea   Nausea and diarrhea   Atorvastatin Other (See Comments)   Neck stiffness   Codeine Itching      Medication List       Accurate as of December 04, 2019 11:59 PM. If you have any questions, ask your nurse or doctor.        STOP taking these medications   Accu-Chek Guide Me w/Device Kit Stopped by: Dorita Sciara, MD   insulin aspart protamine - aspart (70-30) 100 UNIT/ML FlexPen Commonly known as: NovoLOG Mix 70/30 FlexPen Stopped by: Dorita Sciara, MD       TAKE these medications   Accu-Chek FastClix Lancets Misc Use as directed twice a day.  Dx Code: E11.9   amLODipine 5 MG tablet Commonly known as: NORVASC Take 1 tablet (5 mg total) by mouth daily.   cloNIDine 0.2 MG tablet Commonly known as: CATAPRES TAKE 1 TABLET(0.2 MG) BY MOUTH TWICE DAILY   Insulin Pen Needle 32G X 4 MM Misc Inject 1 Device into the skin in the morning and at bedtime. 2x daily What changed:   how much to take  how to take this  when to take this Changed by: Dorita Sciara, MD   NovoLIN 70/30 FlexPen (70-30) 100 UNIT/ML PEN Generic drug: Insulin Isophane & Regular Human Inject 24 Units into the skin 2 (two) times daily with a meal. Started by: Dorita Sciara, MD   OneTouch Verio test strip Generic drug: glucose blood 1 each by Other route 2 (two) times daily. Use as instructed What changed:   how much to take  how to take this  when to take this  additional instructions Changed by: Dorita Sciara, MD   traZODone 50 MG tablet Commonly known as: DESYREL Take 0.5-1 tablets (25-50 mg total) by mouth at bedtime as needed. for sleep   triamterene-hydrochlorothiazide 75-50 MG tablet Commonly known as: MAXZIDE Take 1 tablet by mouth daily.        OBJECTIVE:   Vital Signs: BP 124/82 (BP Location: Left Arm, Patient Position: Sitting, Cuff Size: Large)   Pulse 82   Temp 98.2 F (36.8 C)   Ht '5\' 3"'$  (1.6 m)   Wt 198 lb 6.4 oz (90 kg)   SpO2 93%   BMI 35.14 kg/m   Wt Readings from Last 3 Encounters:  12/04/19 198 lb 6.4 oz (90 kg)  05/13/19 209 lb 7 oz (95 kg)  03/26/19 209 lb 6.4 oz (95 kg)     Exam: General: Pt appears well and is in NAD  Lungs: Clear with good BS bilat with no rales, rhonchi, or wheezes  Heart: RRR with normal S1 and S2 and no gallops; no murmurs; no rub  Abdomen: Normoactive bowel sounds, soft, nontender, without masses or organomegaly palpable  Extremities: No pretibial edema. No tremor.  Normal strength and motion throughout. See detailed diabetic foot exam below.  Skin: Normal texture and temperature to palpation. No rash noted. No Acanthosis nigricans/skin tags. No lipohypertrophy.  Neuro: MS is good with appropriate affect, pt is alert and Ox3    DM foot exam:2/17/221 The skin of the feet is intact without sores or ulcerations. The pedal pulses are 2+ on right and 2+ on left. The sensation is intact to a screening 5.07, 10 gram monofilament bilaterally            DATA REVIEWED:  Lab Results  Component Value Date   HGBA1C 12.0 (  A) 12/04/2019   HGBA1C 11.5 (H) 02/20/2019   HGBA1C 9.8 (H) 08/02/2018   Lab Results  Component Value Date   MICROALBUR <0.7 12/04/2019   LDLCALC 87 02/20/2019   CREATININE 1.01 (H) 05/14/2019   Lab Results  Component Value Date   MICRALBCREAT 1.6 12/04/2019     Lab Results  Component Value Date   CHOL 173 02/20/2019   HDL 51.90 02/20/2019   LDLCALC 87 02/20/2019   LDLDIRECT 115.0 08/02/2018   TRIG 172.0 (H) 02/20/2019   CHOLHDL 3 02/20/2019       In-office Bg 356 mg/dL  ASSESSMENT / PLAN / RECOMMENDATIONS:   1)Type 2 Diabetes Mellitus, Poorly controlled, Without complications - Most recent A1c of  12.0 %. Goal A1c < 7.0 %.    - Poorly controlled diabetes is due to continued medication non-adherence and dietary indiscretions.  -She has not been in the office for 7 months, she has not taken insulin for the past 5 to 6 months, patient has not been checking her glucose at home, stating her meter stopped working, she was provided with a new meter today. -We again discussed the risk of uncontrolled diabetes and increasing the risk of kidney failure, amputations, and blindness, I am not sure the patient is at all motivated to keep control of her glucose.  -She states that she has not taken insulin due to high co-pay, we did discuss that in the future if she has issues like that she could call us and we can try and find  cheaper alternatives.  We are going to switch her to Novolin mix rather than NovoLog mix, I have explained to her that with this regimen she will need to take it 20 to 30 minutes before meal. -We again discussed the importance of checking glucose at least twice daily before breakfast and before supper and to take the insulin regimen before breakfast and before supper. MEDICATIONS:   Novolin mix (70/30) 24 units BID before breakfast and supper   EDUCATION / INSTRUCTIONS:  BG monitoring instructions: Patient is instructed to check her blood sugars 2 times a day, fasting and supper time.  Call [facility] if: BG persistently < 70 or > 300. . I reviewed the Rule of 15 for the treatment of hypoglycemia in detail with the patient. Literature supplied.   2) Diabetic complications:   Eye: Does not have known diabetic retinopathy.   Neuro/ Feet: Does not have known diabetic peripheral neuropathy .   Renal: Patient does not have known baseline CKD, but has a low GFR for the first time , will continue to monitor. She   is  on an ACEI/ARB at present.    3) Lipids: Patient is intolerant to Lipitor and Crestor. Discussed cardiovascular benefits of statin.    F/U in 3 months   Signed electronically by: Mack Guise, MD  [facility]  [facility] [address] [place], [place] [address] Phone: [phone] FAX: [phone]   CC: [address] [place] [address] Phone: [phone]  Fax: [phone]  Return to Endocrinology clinic as below: Future Appointments  Date Time Provider [place]  03/03/2020  3:40 PM Viktoria Gruetzmacher, Melanie Crazier, MD [facility] None

## 2019-12-05 ENCOUNTER — Encounter: Payer: Self-pay | Admitting: Internal Medicine

## 2019-12-06 ENCOUNTER — Encounter: Payer: Self-pay | Admitting: Internal Medicine

## 2019-12-11 ENCOUNTER — Other Ambulatory Visit: Payer: Self-pay

## 2019-12-11 ENCOUNTER — Telehealth: Payer: Self-pay | Admitting: Internal Medicine

## 2019-12-11 MED ORDER — ONETOUCH ULTRASOFT LANCETS MISC: 12 refills | Status: DC

## 2019-12-11 NOTE — Telephone Encounter (Signed)
MEDICATION: One Touch Verio test strips, lancets   PHARMACY:  [facility] at [address] and [address] :  yes  IS PATIENT OUT OF MEDICATION: new machine/RX  IF NOT; HOW MUCH IS LEFT:   LAST APPOINTMENT DATE: @2 /17/2021  NEXT APPOINTMENT DATE:@5 /18/2021  DO WE HAVE YOUR PERMISSION TO LEAVE A DETAILED MESSAGE: yes  OTHER COMMENTS:    **Let patient know to contact pharmacy at the end of the day to make sure medication is ready. **  ** Please notify patient to allow 48-72 hours to process**  **Encourage patient to contact the pharmacy for refills or they can request refills through [facility]**

## 2019-12-11 NOTE — Telephone Encounter (Signed)
Test strips were sent to pharmacy on 12/04/2019 so sent rx for lancets.

## 2019-12-15 ENCOUNTER — Other Ambulatory Visit: Payer: Self-pay | Admitting: Internal Medicine

## 2019-12-15 DIAGNOSIS — G4700 Insomnia, unspecified: Secondary | ICD-10-CM

## 2019-12-16 ENCOUNTER — Telehealth: Payer: Self-pay | Admitting: Internal Medicine

## 2019-12-16 NOTE — Telephone Encounter (Signed)
Pt states the one touch strips we called in are now over $100 told the pt to call her pharmacy or insurance to let us know what alternate there is that insurance will now cover.

## 2019-12-17 NOTE — Telephone Encounter (Signed)
Paperwork was received from [facility] and placed on Dr. Kelton Pillar desk for [employer]

## 2019-12-18 NOTE — Telephone Encounter (Signed)
Approval for onetouch verio test strips from [facility] under part B [id number].

## 2019-12-18 NOTE — Telephone Encounter (Signed)
Faxed

## 2020-01-09 ENCOUNTER — Ambulatory Visit: Payer: Medicare HMO | Attending: Internal Medicine

## 2020-01-09 DIAGNOSIS — Z23 Encounter for immunization: Secondary | ICD-10-CM

## 2020-01-09 NOTE — Progress Notes (Signed)
   Covid-19 Vaccination Clinic  Name:  Heather Walker    MRN: [id number] DOB: Aug 17, 1961  01/09/2020  Heather Walker was observed post Covid-19 immunization for 15 minutes without incident. She was provided with Vaccine Information Sheet and instruction to access the V-Safe system.   Heather Walker was instructed to call 911 with any severe reactions post vaccine: Marland Kitchen Difficulty breathing  . Swelling of face and throat  . A fast heartbeat  . A bad rash all over body  . Dizziness and weakness   Immunizations Administered    Name Date Dose VIS Date Route   Pfizer COVID-19 Vaccine 01/09/2020  8:28 AM 0.3 mL 09/27/2019 Intramuscular   Manufacturer: [place]   Lot: [id number]   [place]: [id number]

## 2020-01-15 ENCOUNTER — Telehealth: Payer: Self-pay | Admitting: Family Medicine

## 2020-01-15 NOTE — Chronic Care Management (AMB) (Signed)
  Chronic Care Management   Note  01/15/2020 Name: Heather Walker MRN: [id number] DOB: Apr 10, 1961  Heather Walker is a 59 y.o. year old female who is a primary care patient of [name], DO. I reached out to Heather Walker by phone today in response to a referral sent by Ms. Connye Burkitt Resor's PCP, Carollee Herter, Alferd Apa, DO.   Heather Walker was given information about Chronic Care Management services today including:  1. CCM service includes personalized support from designated clinical staff supervised by her physician, including individualized plan of care and coordination with other care providers 2. 24/7 contact phone numbers for assistance for urgent and routine care needs. 3. Service will only be billed when office clinical staff spend 20 minutes or more in a month to coordinate care. 4. Only one practitioner may furnish and bill the service in a calendar month. 5. The patient may stop CCM services at any time (effective at the end of the month) by phone call to the office staff.   Patient agreed to services and verbal consent obtained.   Follow up plan:   [name] [employer] Scheduler

## 2020-01-15 NOTE — Progress Notes (Signed)
°  Chronic Care Management   Outreach Note  01/15/2020 Name: Heather Walker MRN: [id number] DOB: 1960/12/15  Referred by: [name], DO Reason for referral : No chief complaint on file.   An unsuccessful telephone outreach was attempted today. The patient was referred to the pharmacist for assistance with care management and care coordination.   Follow Up Plan:   [name] [employer] Scheduler

## 2020-01-23 ENCOUNTER — Telehealth: Payer: Self-pay | Admitting: Family Medicine

## 2020-01-23 NOTE — Telephone Encounter (Signed)
Pt states that the handicapp form that was mailed to her wasn't signed by [place]. Pt was wanting to know if another form can be signed and mailed to her. Thanks

## 2020-01-24 NOTE — Telephone Encounter (Signed)
New form filled out and placed in the mail. Pt notified

## 2020-02-03 ENCOUNTER — Ambulatory Visit: Payer: Medicare HMO | Attending: Internal Medicine

## 2020-02-03 DIAGNOSIS — Z23 Encounter for immunization: Secondary | ICD-10-CM

## 2020-02-03 NOTE — Progress Notes (Signed)
   Covid-19 Vaccination Clinic  Name:  Heather Walker    MRN: [id number] DOB: July 20, 1961  02/03/2020  Heather Walker was observed post Covid-19 immunization for 15 minutes without incident. She was provided with Vaccine Information Sheet and instruction to access the V-Safe system.   Heather Walker was instructed to call 911 with any severe reactions post vaccine: Marland Kitchen Difficulty breathing  . Swelling of face and throat  . A fast heartbeat  . A bad rash all over body  . Dizziness and weakness   Immunizations Administered    Name Date Dose VIS Date Route   Pfizer COVID-19 Vaccine 02/03/2020 11:08 AM 0.3 mL 12/11/2018 Intramuscular   Manufacturer: [place]   Lot: [id number]   [place]: [id number]

## 2020-02-05 ENCOUNTER — Other Ambulatory Visit: Payer: Self-pay

## 2020-02-05 DIAGNOSIS — IMO0001 Reserved for inherently not codable concepts without codable children: Secondary | ICD-10-CM

## 2020-02-05 DIAGNOSIS — I10 Essential (primary) hypertension: Secondary | ICD-10-CM

## 2020-02-05 DIAGNOSIS — E1122 Type 2 diabetes mellitus with diabetic chronic kidney disease: Secondary | ICD-10-CM

## 2020-02-07 ENCOUNTER — Ambulatory Visit: Payer: Medicare HMO | Admitting: Pharmacist

## 2020-02-07 ENCOUNTER — Other Ambulatory Visit: Payer: Self-pay

## 2020-02-07 DIAGNOSIS — I10 Essential (primary) hypertension: Secondary | ICD-10-CM

## 2020-02-07 DIAGNOSIS — E1169 Type 2 diabetes mellitus with other specified complication: Secondary | ICD-10-CM

## 2020-02-07 DIAGNOSIS — E1122 Type 2 diabetes mellitus with diabetic chronic kidney disease: Secondary | ICD-10-CM

## 2020-02-07 DIAGNOSIS — E785 Hyperlipidemia, unspecified: Secondary | ICD-10-CM

## 2020-02-07 DIAGNOSIS — IMO0001 Reserved for inherently not codable concepts without codable children: Secondary | ICD-10-CM

## 2020-02-07 MED ORDER — AMLODIPINE BESYLATE 5 MG PO TABS: 5.0000 mg | ORAL_TABLET | Freq: Every day | ORAL | 3 refills | Status: DC

## 2020-02-07 NOTE — Chronic Care Management (AMB) (Signed)
Chronic Care Management Pharmacy  Name: Heather RIVIELLO  MRN: [id number] DOB: 23-Aug-1961  Chief Complaint/ HPI  Heather Walker,  59 y.o. , female presents for their Initial CCM visit with the clinical pharmacist via telephone due to COVID-19 Pandemic.  PCP : [name], DO  Their chronic conditions include: Diabetes, Hypertension, Asthma, Depression  Office Visits:  10/21/2019 - ([facility]) - labs ordered, no med changes notes  09/03/2019 - ([facility]) - Home BP elevated (170/145, 180/123), no med changes  Consult Visit:  12/04/19 - [facility]) Endocrinology - stopped glipizide, started Novolog mix, does not check her sugars and not taking insulin for 5-6 months, provided a new meter, switched from Novolog mix to Novolin mix, told to check BG bid, f/u in 3 months  Medications: Outpatient Encounter Medications as of 02/07/2020  Medication Sig  . cloNIDine (CATAPRES) 0.2 MG tablet TAKE 1 TABLET(0.2 MG) BY MOUTH TWICE DAILY  . glucose blood (ONETOUCH VERIO) test strip 1 each by Other route 2 (two) times daily. Use as instructed  . Insulin Isophane & Regular Human (NOVOLIN 70/30 FLEXPEN) (70-30) 100 UNIT/ML PEN Inject 24 Units into the skin 2 (two) times daily with a meal.  . Insulin Pen Needle 32G X 4 MM MISC Inject 1 Device into the skin in the morning and at bedtime. 2x daily  . Lancets (ONETOUCH ULTRASOFT) lancets Use as instructed to test blood sugar 2 times daily E11.65  . [DISCONTINUED] amLODipine (NORVASC) 5 MG tablet Take 1 tablet (5 mg total) by mouth daily.  . [DISCONTINUED] traZODone (DESYREL) 50 MG tablet TAKE 1/2 TO 1 TABLET(25 TO 50 MG) BY MOUTH AT BEDTIME AS NEEDED FOR SLEEP  . [DISCONTINUED] triamterene-hydrochlorothiazide (MAXZIDE) 75-50 MG tablet Take 1 tablet by mouth daily.   No facility-administered encounter medications on file as of 02/07/2020.     Current Diagnosis/Assessment:  Goals Addressed            This Visit's Progress   . Pharmacy Care  Plan       CARE PLAN ENTRY  Current Barriers:  . Chronic Disease Management support, education, and care coordination needs related to Diabetes, Hypertension, Asthma, Depression   Hypertension . Pharmacist Clinical Goal(s): o Over the next 90 days, patient will work with PharmD and providers to maintain BP goal <140/90 . Current regimen:  o Amlodipine 5 mg daily, clonidine 0.2mg  twice daily, triamterene-hctz 75-50mg  once daily . Interventions: o Requested refills of amlodipine for patient as she had taken her last dose Jeshawn Melucci of visit . Patient self care activities - Over the next 90 days, patient will: o Ensure daily salt intake < 2300 mg/Heather Walker  Diabetes . Pharmacist Clinical Goal(s): o Over the next 90 days, patient will work with PharmD and providers to achieve A1c goal <7% . Current regimen:  o Novolin 70/30 25 units twice daily . Interventions: o Discussed the importance of limiting carbohydrates (30-45grams per meal)  o Discussed importance of exercise (at least 150 minutes per week) . Patient self care activities - Over the next 90 days, patient will: o Check blood sugar twice daily, document, and provide at future appointments o Limit carbs to 30-45grams per meal  o Exercise for 45 minutes 5 days per week o Contact provider with any episodes of hypoglycemia  Hyperlipidemia . Pharmacist Clinical Goal(s): o Over the next 180 days, patient will work with PharmD and providers to maintain LDL goal <100 . Current regimen:  o Diet and exercise management   .  Patient self care activities - Over the next 180 days, patient will: o Continue control with diet and exercise  Health Maintenance . Pharmacist Clinical Goal(s) o Over the next 180 days, patient will work with PharmD and providers to remain up to date on health maintenance  . Interventions: o Recommended patient to complete Shingrix vaccine series . Patient self care activities - Over the next 180 days, patient  will: o Complete Shingrix vaccine series  Medication management . Pharmacist Clinical Goal(s): o Over the next 90 days, patient will work with PharmD and providers to achieve optimal medication adherence . Current pharmacy: [facility] . Interventions o Comprehensive medication review performed. o Continue current medication management strategy . Patient self care activities - Over the next 90 days, patient will: o Focus on medication adherence by filling medications appropriately  o Take medications as prescribed o Report any questions or concerns to PharmD and/or provider(s)  Initial goal documentation       Social Hx:  Originally from [place].  Has 4 adult children who are local.  Married for 22 years. Patient's husband had bypass surgery in January and that has her very busy.   Diabetes   Recent Relevant Labs: Lab Results  Component Value Date/Time   HGBA1C 12.0 (A) 12/04/2019 02:41 PM   HGBA1C 11.5 (H) 02/20/2019 01:16 PM   HGBA1C 9.8 (H) 08/02/2018 03:25 PM   MICROALBUR <0.7 12/04/2019 03:07 PM   MICROALBUR 1.3 11/10/2016 01:07 PM    A1c goal <7% FBG: 80 - 130 PPBG: <180  Checking BG: Daily  Recent FBG Readings: 211  210  265 due to food eaten 198  166  165  94 limited what she ate night before 160  143  184 Average  179.6   Patient has failed these meds in past: Metformin (GI), Jardiance (cost), Farxiga (neck pain) Patient is currently uncontrolled, but BG seems improved from a1c reading in February on the following medications: Novolin 70/30 25 units bid  Reports she is very adherent to taking Novolin 70/30  Last diabetic Eye exam:  Lab Results  Component Value Date/Time   HMDIABEYEEXA No Retinopathy 04/15/2019 12:00 AM    Last diabetic Foot exam: No results found for: HMDIABFOOTEX   Wakes up in the middle of the night and eats something sweet  Diet: Stays away from starchy foods. No rice. Likes bread, especially corn bread and  biscuits. Tries to stay B: Ham and egg biscuit from biscuit, grape soda L: Usually skips D: Greens with hammock, corn bread Drinks: Water  Exercise: Minimal currently  We discussed: Thoroughly diet by reviewing nutrition labels and noting that she should limit her carbohydrate intake to 30-45 grams per meal  Plan -Limit carbohydrates to 30-45 grams of carbs per meal -Walk for 45 minutes 5 days a week -Continue current medications   Hypertension   CMP Latest Ref Rng & Units 05/14/2019 05/13/2019 02/20/2019  Glucose 70 - 99 mg/dL 245(H) 192(H) 269(H)  BUN 6 - 20 mg/dL 9 7 22   Creatinine 0.44 - 1.00 mg/dL 1.01(H) 1.07(H) 1.34(H)  Sodium 135 - 145 mmol/L 140 142 139  Potassium 3.5 - 5.1 mmol/L 3.5 2.7(LL) 4.0  Chloride 98 - 111 mmol/L 102 99 100  CO2 22 - 32 mmol/L 29 29 30   Calcium 8.9 - 10.3 mg/dL 7.7(L) 8.2(L) 8.6  Total Protein 6.5 - 8.1 g/dL 7.1 - 7.4  Total Bilirubin 0.3 - 1.2 mg/dL 0.5 - 0.4  Alkaline Phos 38 - 126 U/L 62 -  91  AST 15 - 41 U/L 30 - 15  ALT 0 - 44 U/L 25 - 15  GFR      >60   >60   49.45  BP today is:  Unable to assess due to phone visit  Office blood pressures are  BP Readings from Last 3 Encounters:  12/04/19 124/82  05/14/19 (!) 138/92  03/26/19 136/88   Blood pressure goal <140/90  Patient has failed these meds in the past: lisinopril 10mg , losartan 100mg , metoprolol tartrate 25mg  bid, metoprolol succinate 25mg  daily  Patient is currently controlled on the following medications: amlodipine 5 mg daily, clonidine 0.2mg  twice daily, triamterene-hctz 75-50mg  once daily  Patient states she took her last dose of amlodipine today and needs a refill per her pharmacy.  Patient checks BP at home infrequently (machine broke, feels like she can't afford to purchase another one)  Patient home BP readings are ranging: N/A  We discussed Proper blood pressure measurement technique   Plan -Request refills for amlodipine be sent into patient's pharmacy as she  took her last dose today -See if insurance will cover BP cuff at pharmacy -Continue current medications      Asthma / Tobacco   Last spirometry score: None noted  Eosinophil count:   Lab Results  Component Value Date/Time   EOSPCT 0 05/14/2019 03:20 AM  %                               Eos (Absolute):  Lab Results  Component Value Date/Time   EOSABS 0.0 05/14/2019 03:20 AM    Tobacco Status:  Social History   Tobacco Use  Smoking Status Never Smoker  Smokeless Tobacco Never Used    Patient has failed these meds in past: albuterol inh??, Qvar Patient is currently controlled on the following medications: None noted  Experiencing wheezing when the season changes Reports she does have an inhaler on hand (2 years or more old) Last used albuterol about 2 weeks ago. She woke up in the middle of the night and could not breathe. Does not have to use often   Using maintenance inhaler regularly? No (does not have one prescribed) Frequency of rescue inhaler use:  several times per month  Plan -Consider getting new albuterol inhaler pending Dr. Etter Sjogren approval -Continue current medications  Depression    Patient has failed these meds in past: None noted  Patient is currently controlled on the following medications: None  States she is doing well and not struggling with depression.  Encouraged patient that we are here to support her if needed.  Will administer PHQ9 at next visit  Plan -Continue current management  Hyperlipidemia   Lipid Panel     Component Value Date/Time   CHOL 173 02/20/2019 1053   TRIG 172.0 (H) 02/20/2019 1053   HDL 51.90 02/20/2019 1053   CHOLHDL 3 02/20/2019 1053   VLDL 34.4 02/20/2019 1053   LDLCALC 87 02/20/2019 1053   LDLDIRECT 115.0 08/02/2018 1525     The 10-year ASCVD risk score Mikey Bussing DC Jr., et al., 2013) is: 10.8%   Values used to calculate the score:     Age: 22 years     Sex: Female     Is Non-Hispanic African American: Yes      Diabetic: Yes     Tobacco smoker: No     Systolic Blood Pressure: [id number] mmHg     Is BP treated: Yes  HDL Cholesterol: 51.9 mg/dL     Total Cholesterol: 173 mg/dL   Patient has failed these meds in past: crestor (anaphylaxis), atorvastatin (neck stiffneses) Patient is currently controlled except for TG on the following medications: none  We discussed:  diet and exercise extensively  Plan -Continue control with diet and exercise   Future Plan -If TG remains elevated consider adding fish oil   Vaccines   Reviewed and discussed patient's vaccination history.    Immunization History  Administered Date(s) Administered  . Influenza,inj,Quad PF,6+ Mos 07/25/2014, 10/05/2015, 10/05/2017  . PFIZER SARS-COV-2 Vaccination 01/09/2020, 02/03/2020  . Pneumococcal Polysaccharide-23 07/15/2014  . Td 04/12/2006  . Tdap 02/24/2011    Plan -Recommended patient receive shingles vaccine in pharmacy.   Miscellaneous Meds  Tramadol for pack and knee pain Trazadone 50mg  #1/2 to 1 hs   Patient is interested in [facility] and would like to consider switching pharmacies at next visit.

## 2020-02-11 ENCOUNTER — Other Ambulatory Visit: Payer: Self-pay | Admitting: *Deleted

## 2020-02-11 DIAGNOSIS — G4700 Insomnia, unspecified: Secondary | ICD-10-CM

## 2020-02-11 MED ORDER — TRAZODONE HCL 50 MG PO TABS: ORAL_TABLET | ORAL | 0 refills | Status: DC

## 2020-02-14 ENCOUNTER — Other Ambulatory Visit: Payer: Self-pay

## 2020-02-14 MED ORDER — TRIAMTERENE-HCTZ 75-50 MG PO TABS: 1.0000 | ORAL_TABLET | Freq: Every day | ORAL | 1 refills | Status: DC

## 2020-02-20 ENCOUNTER — Other Ambulatory Visit: Payer: Self-pay

## 2020-02-20 ENCOUNTER — Telehealth: Payer: Self-pay | Admitting: Internal Medicine

## 2020-02-20 MED ORDER — ONETOUCH DELICA LANCETS 33G MISC: 1.0000 | Freq: Two times a day (BID) | 6 refills | Status: DC

## 2020-02-20 NOTE — Telephone Encounter (Signed)
Medication Refill Request  Did you call your pharmacy and request this refill first? Yes  . If patient has not contacted pharmacy first, instruct them to do so for future refills.  . Remind them that contacting the pharmacy for their refill is the quickest method to get the refill.  . Refill policy also stated that it will take anywhere between 24-72 hours to receive the refill.    Name of medication? One touch pen needles (patient said the ones called in last time were incorrect - she said she needs Delica Plus)  Is this a 90 day supply? "box of 100"  Name and location of pharmacy?  [facility] [phone] - [address], [address] Phone:  [phone]  Fax:  [phone]

## 2020-02-20 NOTE — Telephone Encounter (Signed)
I sent lancets hopefully that is what was being requested  because that is what came up in the system when I put in [facility] and not needles.

## 2020-02-24 ENCOUNTER — Telehealth: Payer: Self-pay | Admitting: Family Medicine

## 2020-02-24 NOTE — Progress Notes (Signed)
  Chronic Care Management   Outreach Note  02/24/2020 Name: SUMA ROCCAFORTE MRN: [id number] DOB: Jun 16, 1961  Referred by: [name], DO Reason for referral : No chief complaint on file.   An unsuccessful telephone outreach was attempted today. The patient was referred to the pharmacist for assistance with care management and care coordination.   This note is not being shared with the patient for the following reason: To respect privacy (The patient or proxy has requested that the information not be shared).  Follow Up Plan:   Earney [facility] Scheduler

## 2020-02-25 NOTE — Patient Instructions (Signed)
Walker Information  Goals Addressed            This Walker's Progress   . Pharmacy Care Plan       CARE PLAN ENTRY  Current Barriers:  . Chronic Disease Management support, education, and care coordination needs related to Diabetes, Hypertension, Asthma, Depression   Hypertension . Pharmacist Clinical Goal(s): o Over the next 90 days, patient will work with PharmD and providers to maintain BP goal <140/90 . Current regimen:  o Amlodipine 5 mg daily, clonidine 0.2mg  twice daily, triamterene-hctz 75-50mg  once daily . Interventions: o Requested refills of amlodipine for patient as she had taken her last dose Heather Walker of [facility] . Patient self care activities - Over the next 90 days, patient will: o Ensure daily salt intake < 2300 mg/Heather Walker  Diabetes . Pharmacist Clinical Goal(s): o Over the next 90 days, patient will work with PharmD and providers to achieve A1c goal <7% . Current regimen:  o Novolin 70/30 25 units twice daily . Interventions: o Discussed the importance of limiting carbohydrates (30-45grams per meal)  o Discussed importance of exercise (at least 150 minutes per week) . Patient self care activities - Over the next 90 days, patient will: o Check blood sugar twice daily, document, and provide at future appointments o Limit carbs to 30-45grams per meal  o Exercise for 45 minutes 5 days per week o Contact provider with any episodes of hypoglycemia  Hyperlipidemia . Pharmacist Clinical Goal(s): o Over the next 180 days, patient will work with PharmD and providers to maintain LDL goal <100 . Current regimen:  o Diet and exercise management   . Patient self care activities - Over the next 180 days, patient will: o Continue control with diet and exercise  Health Maintenance . Pharmacist Clinical Goal(s) o Over the next 180 days, patient will work with PharmD and providers to remain up to date on health maintenance  . Interventions: o Recommended patient to complete Shingrix  vaccine series . Patient self care activities - Over the next 180 days, patient will: o Complete Shingrix vaccine series  Medication management . Pharmacist Clinical Goal(s): o Over the next 90 days, patient will work with PharmD and providers to achieve optimal medication adherence . Current pharmacy: [facility] . Interventions o Comprehensive medication review performed. o Continue current medication management strategy . Patient self care activities - Over the next 90 days, patient will: o Focus on medication adherence by filling medications appropriately  o Take medications as prescribed o Report any questions or concerns to PharmD and/or provider(s)  Initial goal documentation        Heather Walker was given information about Chronic Care Management services today including:  1. CCM service includes personalized support from designated clinical staff supervised by her physician, including individualized plan of care and coordination with other care providers 2. 24/7 contact phone numbers for assistance for urgent and routine care needs. 3. Standard insurance, coinsurance, copays and deductibles apply for chronic care management only during months in which we provide at least 20 minutes of these services. Most insurances cover these services at 100%, however patients may be responsible for any copay, coinsurance and/or deductible if applicable. This service may help you avoid the need for more expensive face-to-face services. 4. Only one practitioner may furnish and bill the service in a calendar month. 5. The patient may stop CCM services at any time (effective at the end of the month) by phone call to the office staff.  Patient agreed  to services and verbal consent obtained.   The patient verbalized understanding of instructions provided today and agreed to receive a mailed copy of patient instruction and/or educational materials. Telephone follow up appointment with pharmacy team  member scheduled for: 04/07/2020  Heather Walker, PharmD Clinical Pharmacist [facility] [phone]    Cholesterol Content in Foods Cholesterol is a waxy, fat-like substance that helps to carry fat in the blood. The body needs cholesterol in small amounts, but too much cholesterol can cause damage to the arteries and heart. Most people should eat less than 200 milligrams (mg) of cholesterol a Heather Walker. Foods with cholesterol  Cholesterol is found in animal-based foods, such as meat, seafood, and dairy. Generally, low-fat dairy and lean meats have less cholesterol than full-fat dairy and fatty meats. The milligrams of cholesterol per serving (mg per serving) of common cholesterol-containing foods are listed below. Meat and other proteins  Egg - one large whole egg has 186 mg.  Veal shank - 4 oz has 141 mg.  Lean ground [place] (93% lean) - 4 oz has 118 mg.  Fat-trimmed lamb loin - 4 oz has 106 mg.  Lean ground beef (90% lean) - 4 oz has 100 mg.  Lobster - 3.5 oz has 90 mg.  Pork loin chops - 4 oz has 86 mg.  Canned salmon - 3.5 oz has 83 mg.  Fat-trimmed beef top loin - 4 oz has 78 mg.  Frankfurter - 1 frank (3.5 oz) has 77 mg.  Crab - 3.5 oz has 71 mg.  Roasted chicken without skin, white meat - 4 oz has 66 mg.  Light bologna - 2 oz has 45 mg.  Deli-cut [place] - 2 oz has 31 mg.  Canned tuna - 3.5 oz has 31 mg.  Bacon - 1 oz has 29 mg.  Oysters and mussels (raw) - 3.5 oz has 25 mg.  Mackerel - 1 oz has 22 mg.  Trout - 1 oz has 20 mg.  Pork sausage - 1 link (1 oz) has 17 mg.  Salmon - 1 oz has 16 mg.  Tilapia - 1 oz has 14 mg. Dairy  Soft-serve ice cream -  cup (4 oz) has 103 mg.  Whole-milk yogurt - 1 cup (8 oz) has 29 mg.  Cheddar cheese - 1 oz has 28 mg.  American cheese - 1 oz has 28 mg.  Whole milk - 1 cup (8 oz) has 23 mg.  2% milk - 1 cup (8 oz) has 18 mg.  Cream cheese - 1 tablespoon (Tbsp) has 15 mg.  Cottage  cheese -  cup (4 oz) has 14 mg.  Low-fat (1%) milk - 1 cup (8 oz) has 10 mg.  Sour cream - 1 Tbsp has 8.5 mg.  Low-fat yogurt - 1 cup (8 oz) has 8 mg.  Nonfat Greek yogurt - 1 cup (8 oz) has 7 mg.  Half-and-half cream - 1 Tbsp has 5 mg. Fats and oils  Cod liver oil - 1 tablespoon (Tbsp) has 82 mg.  Butter - 1 Tbsp has 15 mg.  Lard - 1 Tbsp has 14 mg.  Bacon grease - 1 Tbsp has 14 mg.  Mayonnaise - 1 Tbsp has 5-10 mg.  Margarine - 1 Tbsp has 3-10 mg. Exact amounts of cholesterol in these foods may vary depending on specific ingredients and brands. Foods without cholesterol Most plant-based foods do not have cholesterol unless you combine them with a food that has cholesterol. Foods  without cholesterol include:  Grains and cereals.  Vegetables.  Fruits.  Vegetable oils, such as olive, canola, and sunflower oil.  Legumes, such as peas, beans, and lentils.  Nuts and seeds.  Egg whites. Summary  The body needs cholesterol in small amounts, but too much cholesterol can cause damage to the arteries and heart.  Most people should eat less than 200 milligrams (mg) of cholesterol a Heather Walker. This information is not intended to replace advice given to you by your health care provider. Make sure you discuss any questions you have with your health care provider. Document Revised: 09/15/2017 Document Reviewed: 05/30/2017 Elsevier Patient Education  [place].

## 2020-02-26 ENCOUNTER — Telehealth: Payer: Self-pay

## 2020-02-26 NOTE — Telephone Encounter (Signed)
Patient called in to see if DR. Lowne could send in a prescription for a different pain medication for her back. Please send it to [facility] [place] - [address]  [address] [place] [address]  Phone:  [phone] Fax:  [phone]  DEA #:  [id number]

## 2020-02-26 NOTE — Telephone Encounter (Signed)
Pt needs ov --- if pain is worse we need to find out why--- if she has specialist she sees for this then she can consider seeing them as well

## 2020-02-26 NOTE — Telephone Encounter (Signed)
Spoke with patient. Pt states she is taking Tramadol and states it is not helping with the back pain anymore. Pt requesting new pain medication. Pt lasts OV was 10/21/19. Please advise

## 2020-02-26 NOTE — Telephone Encounter (Signed)
Spoke with patient. Pt made appointment for next week

## 2020-02-28 ENCOUNTER — Other Ambulatory Visit: Payer: Self-pay

## 2020-03-02 NOTE — Progress Notes (Signed)
Name: Heather Walker  Age/ Sex: 59 y.o., female   MRN/ DOB: [id number], [birth year]     PCP: [name], DO   Reason for Endocrinology Evaluation: Type 2 Diabetes Mellitus  Initial Endocrine Consultative Visit: 09/26/2018    PATIENT IDENTIFIER: Heather Walker is a 59 y.o. female with a past medical history of HTN, T2DM, GERD and Dyslipidemia . The patient has followed with Endocrinology clinic since 09/26/2018 for consultative assistance with management of her diabetes.  DIABETIC HISTORY:  Heather Walker was diagnosed with T2DM in 2015. She has reported GI intolerance to Metformin. She was unable to try Jardiance due to insurance issues. Wilder Glade gave her neck pain. Her hemoglobin A1c has ranged from 8.5%  in 2016, peaking at 9.8% in 2019.   On her initial visit to our clinic her A1c 9.8%. She was not taking any of her prescribed medications. We stopped Glipizide and started Novolog mix.  She has declined a CDE referral    SUBJECTIVE:   During the last visit (12/04/2019): A1c was 12.0 %. She was not taking her insulin due to high co-pay, we switched from Novolog mix to Novolin-Mix to see if that would be more affortable     Today (03/03/2020): Heather Walker is here for a follow up on diabetes management.She does check her glucose twice a day, she denies any hypoglycemic episodes. Otherwise, the patient has not required any recent emergency interventions for hypoglycemia and has not had recent hospitalizations secondary to hyper or hypoglycemic episodes.   Has noted abdominal rash since starting on Novolin Injection.     ROS: As per HPI and as detailed below: Review of Systems  HENT: Negative for congestion and sore throat.   Respiratory: Negative for cough.   Cardiovascular: Negative for chest pain.  Gastrointestinal: Negative for diarrhea and nausea.  Genitourinary: Positive for frequency.  Endo/Heme/Allergies: Negative for polydipsia.      HOME DIABETES REGIMEN:     Novolin Mix (70/30) 25 units BID    METER DOWNLOAD SUMMARY: Did not bring    DIABETIC COMPLICATIONS: Microvascular complications:   Denies: retinopathy , neuropathy  Last eye exam: Completed 01/2019  Macrovascular complications:   Denies: CAD, PVD, CVA  HISTORY:  Past Medical History:  Past Medical History:  Diagnosis Date  . Arthritis   . Asthma   . Depression   . Diabetes mellitus without complication ([place])   . Heart murmur   . Hypertension    Past Surgical History:  Past Surgical History:  Procedure Laterality Date  . ABDOMINAL HYSTERECTOMY    . APPENDECTOMY    . BLADDER SUSPENSION    . CESAREAN SECTION     3 previous  . TEE WITHOUT CARDIOVERSION N/A 08/24/2017   Procedure: TRANSESOPHAGEAL ECHOCARDIOGRAM (TEE);  Surgeon: Larey Dresser, MD;  Location: [facility] ENDOSCOPY;  Service: Cardiovascular;  Laterality: N/A;  . TUBAL LIGATION      Social History:  reports that she has never smoked. She has never used smokeless tobacco. She reports that she does not drink alcohol or use drugs. Family History:  Family History  Problem Relation Age of Onset  . Hypertension Mother   . Asthma Mother   . Hypertension Sister   . Asthma Sister   . Diabetes Sister   . Hypertension Maternal Grandmother   . Heart defect Sister   . Asthma Sister   . Aneurysm Sister   . Asthma Sister      HOME MEDICATIONS: Allergies as of  03/03/2020      Reactions   Crestor [rosuvastatin Calcium] Anaphylaxis   Metformin And Related Diarrhea   Nausea and diarrhea   Atorvastatin Other (See Comments)   Neck stiffness   Codeine Itching      Medication List       Accurate as of Mar 03, 2020  8:49 AM. If you have any questions, ask your nurse or doctor.        amLODipine 5 MG tablet Commonly known as: NORVASC Take 1 tablet (5 mg total) by mouth daily.   cloNIDine 0.2 MG tablet Commonly known as: CATAPRES TAKE 1 TABLET(0.2 MG) BY MOUTH TWICE DAILY   Insulin Pen Needle 32G X 4 MM  Misc Inject 1 Device into the skin in the morning and at bedtime. 2x daily   NovoLIN 70/30 FlexPen (70-30) 100 UNIT/ML KwikPen Generic drug: insulin isophane & regular human Inject 24 Units into the skin 2 (two) times daily with a meal.   onetouch ultrasoft lancets Use as instructed to test blood sugar 2 times daily 123456   OneTouch Delica Lancets 99991111 Misc 1 Package by Does not apply route in the morning and at bedtime. Use as instructed 2 times daily E11.65   OneTouch Verio test strip Generic drug: glucose blood 1 each by Other route 2 (two) times daily. Use as instructed   traZODone 50 MG tablet Commonly known as: DESYREL TAKE 1/2 TO 1 TABLET(25 TO 50 MG) BY MOUTH AT BEDTIME AS NEEDED FOR SLEEP   triamterene-hydrochlorothiazide 75-50 MG tablet Commonly known as: MAXZIDE Take 1 tablet by mouth daily.        OBJECTIVE:   Vital Signs: BP 120/80 (BP Location: Left Arm, Patient Position: Sitting, Cuff Size: Large)   Pulse 78   Temp 98.2 F (36.8 C)   Ht 5\' 3"  (1.6 m)   Wt 202 lb (91.6 kg)   SpO2 98%   BMI 35.78 kg/m   Wt Readings from Last 3 Encounters:  03/03/20 202 lb (91.6 kg)  12/04/19 198 lb 6.4 oz (90 kg)  05/13/19 209 lb 7 oz (95 kg)     Exam: General: Pt appears well and is in NAD  Lungs: Clear with good BS bilat with no rales, rhonchi, or wheezes  Heart: RRR   Abdomen: Normoactive bowel sounds, soft, nontender,   Extremities: No pretibial edema.   Skin: Pt with macular abdominal wall lesion, covered by scabs   Neuro: MS is good with appropriate affect, pt is alert and Ox3    DM foot exam:12/04/2019 The skin of the feet is intact without sores or ulcerations. The pedal pulses are 2+ on right and 2+ on left. The sensation is intact to a screening 5.07, 10 gram monofilament bilaterally         DATA REVIEWED:  Lab Results  Component Value Date   HGBA1C 8.7 (A) 03/03/2020   HGBA1C 12.0 (A) 12/04/2019   HGBA1C 11.5 (H) 02/20/2019   Lab  Results  Component Value Date   MICROALBUR <0.7 12/04/2019   LDLCALC 87 02/20/2019   CREATININE 1.01 (H) 05/14/2019   Lab Results  Component Value Date   MICRALBCREAT 1.6 12/04/2019     Lab Results  Component Value Date   CHOL 173 02/20/2019   HDL 51.90 02/20/2019   LDLCALC 87 02/20/2019   LDLDIRECT 115.0 08/02/2018   TRIG 172.0 (H) 02/20/2019   CHOLHDL 3 02/20/2019       In-office Bg 201 mg/dL  ASSESSMENT / PLAN / RECOMMENDATIONS:  1)Type 2 Diabetes Mellitus, Improving Glycemic Control, Without complications - Most recent A1c of  8.7 %. Goal A1c < 7.0 %.    - Praised the pt on improved glycemic control  - She is intolerant to metformin and Farxiga - We discussed add-on therapy with GLP-1 agonist , we discussed the benefits as well as side effects. Pt cautioned against GI side effects    MEDICATIONS:   Start Ozempic 0.25 mg weekly, if no side effects after 6 weeks increase to 0.5 mg weekly  Decrease Novolin mix (70/30) 22 units BID before breakfast and supper   EDUCATION / INSTRUCTIONS:  BG monitoring instructions: Patient is instructed to check her blood sugars 2 times a day, fasting and supper time.  Call [facility] if: BG persistently < 70  . I reviewed the Rule of 15 for the treatment of hypoglycemia in detail with the patient. Literature supplied.   2) Diabetic complications:   Eye: Does not have known diabetic retinopathy.   Neuro/ Feet: Does not have known diabetic peripheral neuropathy .   Renal: Patient does not have known baseline CKD, but has a low GFR for the first time , will continue to monitor. She   is  on an ACEI/ARB at present.   3) Skin Rash :  - Pt attributes this to Novolin. I am not sure at this time, its centered about the epigastric area. Pt advised to avoid abdominal area for the next 2 weeks and use the sides of the thighs in the meantime. If she develops rash there too, she will notify us and will have to switch  to another brand.      F/U in 3 months   Signed electronically by: Mack Guise, MD  [facility] Endocrinology  [facility] [address] [place], [place] [address] Phone: [phone] FAX: [phone]   CC: [address] [place] [address] Phone: [phone]  Fax: [phone]  Return to Endocrinology clinic as below: Future Appointments  Date Time Provider [place]  03/03/2020  2:40 PM [name], DO [facility]  04/07/2020 10:00 AM [facility] PHARMACIST [facility]

## 2020-03-03 ENCOUNTER — Encounter: Payer: Self-pay | Admitting: Internal Medicine

## 2020-03-03 ENCOUNTER — Ambulatory Visit (INDEPENDENT_AMBULATORY_CARE_PROVIDER_SITE_OTHER): Payer: Medicare HMO | Admitting: Internal Medicine

## 2020-03-03 ENCOUNTER — Ambulatory Visit: Payer: Medicare HMO | Admitting: Internal Medicine

## 2020-03-03 ENCOUNTER — Other Ambulatory Visit: Payer: Self-pay

## 2020-03-03 ENCOUNTER — Ambulatory Visit (INDEPENDENT_AMBULATORY_CARE_PROVIDER_SITE_OTHER): Payer: Medicare HMO | Admitting: Family Medicine

## 2020-03-03 ENCOUNTER — Encounter: Payer: Self-pay | Admitting: Family Medicine

## 2020-03-03 VITALS — BP 120/80 | HR 78 | Temp 98.2°F | Ht 63.0 in | Wt 202.0 lb

## 2020-03-03 VITALS — BP 120/80 | HR 91 | Temp 97.1°F | Resp 18 | Ht 63.0 in | Wt 201.4 lb

## 2020-03-03 DIAGNOSIS — L233 Allergic contact dermatitis due to drugs in contact with skin: Secondary | ICD-10-CM | POA: Diagnosis not present

## 2020-03-03 DIAGNOSIS — R21 Rash and other nonspecific skin eruption: Secondary | ICD-10-CM | POA: Diagnosis not present

## 2020-03-03 DIAGNOSIS — R2689 Other abnormalities of gait and mobility: Secondary | ICD-10-CM

## 2020-03-03 DIAGNOSIS — E1165 Type 2 diabetes mellitus with hyperglycemia: Secondary | ICD-10-CM

## 2020-03-03 LAB — GLUCOSE, POCT (MANUAL RESULT ENTRY): POC Glucose: 201 mg/dl — AB (ref 70–99)

## 2020-03-03 LAB — POCT GLYCOSYLATED HEMOGLOBIN (HGB A1C): Hemoglobin A1C: 8.7 % — AB (ref 4.0–5.6)

## 2020-03-03 MED ORDER — NOVOLIN 70/30 FLEXPEN (70-30) 100 UNIT/ML ~~LOC~~ SUPN: 22.0000 [IU] | PEN_INJECTOR | Freq: Two times a day (BID) | SUBCUTANEOUS | 6 refills | Status: DC

## 2020-03-03 MED ORDER — TRIAMCINOLONE ACETONIDE 0.1 % EX CREA: 1.0000 "application " | TOPICAL_CREAM | Freq: Two times a day (BID) | CUTANEOUS | 0 refills | Status: DC

## 2020-03-03 MED ORDER — OZEMPIC (0.25 OR 0.5 MG/DOSE) 2 MG/1.5ML ~~LOC~~ SOPN: 0.5000 mg | PEN_INJECTOR | SUBCUTANEOUS | 6 refills | Status: DC

## 2020-03-03 NOTE — Patient Instructions (Signed)

## 2020-03-03 NOTE — Progress Notes (Signed)
Patient ID: [name], female    DOB: 1961-05-23  Age: 59 y.o. MRN: [id number]    Subjective:  Subjective  HPI Heather Walker presents for back pain and staggering.  She has fallen 2x--- due to losing balance   No dizziness, no headache,  Pt denies weakness in legs   Review of Systems  Constitutional: Negative for appetite change, diaphoresis, fatigue and unexpected weight change.  Eyes: Negative for pain, redness and visual disturbance.  Respiratory: Negative for cough, chest tightness, shortness of breath and wheezing.   Cardiovascular: Negative for chest pain, palpitations and leg swelling.  Endocrine: Negative for cold intolerance, heat intolerance, polydipsia, polyphagia and polyuria.  Genitourinary: Negative for difficulty urinating, dysuria and frequency.  Musculoskeletal: Positive for back pain.  Neurological: Negative for dizziness, light-headedness, numbness and headaches.    History Past Medical History:  Diagnosis Date  . Arthritis   . Asthma   . Depression   . Diabetes mellitus without complication ([place])   . Heart murmur   . Hypertension     She has a past surgical history that includes Abdominal hysterectomy; Bladder suspension; Appendectomy; Cesarean section; Tubal ligation; and TEE without cardioversion (N/A, 08/24/2017).   Her family history includes Aneurysm in her sister; Asthma in her mother, sister, sister, and sister; Diabetes in her sister; Heart defect in her sister; Hypertension in her maternal grandmother, mother, and sister.She reports that she has never smoked. She has never used smokeless tobacco. She reports that she does not drink alcohol or use drugs.  Current Outpatient Medications on File Prior to Visit  Medication Sig Dispense Refill  . amLODipine (NORVASC) 5 MG tablet Take 1 tablet (5 mg total) by mouth daily. 30 tablet 3  . cloNIDine (CATAPRES) 0.2 MG tablet TAKE 1 TABLET(0.2 MG) BY MOUTH TWICE DAILY 180 tablet 1  . glucose blood (ONETOUCH  VERIO) test strip 1 each by Other route 2 (two) times daily. Use as instructed 100 each 6  . Insulin Pen Needle 32G X 4 MM MISC Inject 1 Device into the skin in the morning and at bedtime. 2x daily 100 each 6  . Lancets (ONETOUCH ULTRASOFT) lancets Use as instructed to test blood sugar 2 times daily E11.65 100 each 12  . OneTouch Delica Lancets 99991111 MISC 1 Package by Does not apply route in the morning and at bedtime. Use as instructed 2 times daily E11.65 100 each 6  . traZODone (DESYREL) 50 MG tablet TAKE 1/2 TO 1 TABLET(25 TO 50 MG) BY MOUTH AT BEDTIME AS NEEDED FOR SLEEP 30 tablet 0  . triamterene-hydrochlorothiazide (MAXZIDE) 75-50 MG tablet Take 1 tablet by mouth daily. 90 tablet 1   No current facility-administered medications on file prior to visit.     Objective:  Objective  Physical Exam Vitals and nursing note reviewed.  Constitutional:      Appearance: She is well-developed.  HENT:     Head: Normocephalic and atraumatic.  Eyes:     Conjunctiva/sclera: Conjunctivae normal.  Neck:     Thyroid: No thyromegaly.     Vascular: No carotid bruit or JVD.  Cardiovascular:     Rate and Rhythm: Normal rate and regular rhythm.     Heart sounds: Normal heart sounds. No murmur.  Pulmonary:     Effort: Pulmonary effort is normal. No respiratory distress.     Breath sounds: Normal breath sounds. No wheezing or rales.  Chest:     Chest wall: No tenderness.  Musculoskeletal:  Cervical back: Normal range of motion and neck supple.     Lumbar back: Normal. Normal range of motion. Negative right straight leg raise test and negative left straight leg raise test.  Neurological:     Mental Status: She is alert and oriented to person, place, and time.    BP 120/80 (BP Location: Right Arm, Patient Position: Sitting, Cuff Size: Large)   Pulse 91   Temp (!) 97.1 F (36.2 C) (Temporal)   Resp 18   Ht 5\' 3"  (1.6 m)   Wt 201 lb 6.4 oz (91.4 kg)   SpO2 97%   BMI 35.68 kg/m  Wt Readings  from Last 3 Encounters:  03/03/20 201 lb 6.4 oz (91.4 kg)  03/03/20 202 lb (91.6 kg)  12/04/19 198 lb 6.4 oz (90 kg)     Lab Results  Component Value Date   WBC 2.6 (L) 05/14/2019   HGB 11.4 (L) 05/14/2019   HCT 34.8 (L) 05/14/2019   PLT 158 05/14/2019   GLUCOSE 245 (H) 05/14/2019   CHOL 173 02/20/2019   TRIG 172.0 (H) 02/20/2019   HDL 51.90 02/20/2019   LDLDIRECT 115.0 08/02/2018   LDLCALC 87 02/20/2019   ALT 25 05/14/2019   AST 30 05/14/2019   NA 140 05/14/2019   K 3.5 05/14/2019   CL 102 05/14/2019   CREATININE 1.01 (H) 05/14/2019   BUN 9 05/14/2019   CO2 29 05/14/2019   TSH 1.124 12/08/2014   INR 1.05 08/24/2017   HGBA1C 8.7 (A) 03/03/2020   MICROALBUR <0.7 12/04/2019    MM 3D SCREEN BREAST BILATERAL  Result Date: 08/26/2019 CLINICAL DATA:  Screening. EXAM: DIGITAL SCREENING BILATERAL MAMMOGRAM WITH TOMO AND CAD COMPARISON:  Previous exam(s). ACR Breast Density Category a: The breast tissue is almost entirely fatty. FINDINGS: There are no findings suspicious for malignancy. Images were processed with CAD. IMPRESSION: No mammographic evidence of malignancy. A result letter of this screening mammogram will be mailed directly to the patient. RECOMMENDATION: Screening mammogram in one year. (Code:[id number]-B-01Y) BI-RADS CATEGORY  1: Negative. Electronically Signed   By: Lajean Manes M.D.   On: 08/26/2019 12:34     Assessment & Plan:  Plan  I am having Heather Walker start on triamcinolone cream. I am also having her maintain her cloNIDine, OneTouch Verio, Insulin Pen Needle, onetouch ultrasoft, amLODipine, traZODone, triamterene-hydrochlorothiazide, OneTouch Delica Lancets 99991111, Ozempic (0.25 or 0.5 MG/DOSE), and NovoLIN 70/30 FlexPen.  Meds ordered this encounter  Medications  . triamcinolone cream (KENALOG) 0.1 %    Sig: Apply 1 application topically 2 (two) times daily.    Dispense:  30 g    Refill:  0    Problem List Items Addressed This Visit    None    Visit  Diagnoses    Allergic contact dermatitis due to drugs in contact with skin    -  Primary   Relevant Medications   triamcinolone cream (KENALOG) 0.1 %   Balance problem       Relevant Orders   Ambulatory referral to Neurology   Ambulatory referral to Physical Therapy     Follow-up: No follow-ups on file.  [name], DO

## 2020-03-03 NOTE — Patient Instructions (Signed)
-   Start Ozempic 0.25 mg ONCE weekly, If no vomiting or diarrhea, Please increase to 0.5 mg ONCE weekly  - Decrease Novolin Mix to 22 units with Breakfast and 22 units with Supper    - If you are unable to get the Ozempic, please continue on 25 units of Insulin       HOW TO TREAT LOW BLOOD SUGARS (Blood sugar LESS THAN 70 MG/DL)  Please follow the RULE OF 15 for the treatment of hypoglycemia treatment (when your (blood sugars are less than 70 mg/dL)    STEP 1: Take 15 grams of carbohydrates when your blood sugar is low, which includes:   3-4 GLUCOSE TABS  OR  3-4 OZ OF JUICE OR REGULAR SODA OR  ONE TUBE OF GLUCOSE GEL     STEP 2: RECHECK blood sugar in 15 MINUTES STEP 3: If your blood sugar is still low at the 15 minute recheck --> then, go back to STEP 1 and treat AGAIN with another 15 grams of carbohydrates.

## 2020-03-04 ENCOUNTER — Ambulatory Visit: Payer: Medicare HMO

## 2020-03-05 ENCOUNTER — Telehealth: Payer: Medicare HMO

## 2020-04-06 ENCOUNTER — Emergency Department (HOSPITAL_BASED_OUTPATIENT_CLINIC_OR_DEPARTMENT_OTHER): Payer: Medicare HMO

## 2020-04-06 ENCOUNTER — Encounter (HOSPITAL_BASED_OUTPATIENT_CLINIC_OR_DEPARTMENT_OTHER): Payer: Self-pay

## 2020-04-06 ENCOUNTER — Inpatient Hospital Stay (HOSPITAL_BASED_OUTPATIENT_CLINIC_OR_DEPARTMENT_OTHER)
Admission: AD | Admit: 2020-04-06 | Discharge: 2020-04-12 | DRG: 558 | Disposition: A | Discharge status: Home Health | Payer: Medicare HMO | Source: Ambulatory Visit | Attending: Internal Medicine | Admitting: Internal Medicine

## 2020-04-06 ENCOUNTER — Other Ambulatory Visit: Payer: Self-pay

## 2020-04-06 DIAGNOSIS — M7501 Adhesive capsulitis of right shoulder: Secondary | ICD-10-CM | POA: Diagnosis present

## 2020-04-06 DIAGNOSIS — Z8616 Personal history of COVID-19: Secondary | ICD-10-CM

## 2020-04-06 DIAGNOSIS — M75101 Unspecified rotator cuff tear or rupture of right shoulder, not specified as traumatic: Secondary | ICD-10-CM | POA: Diagnosis not present

## 2020-04-06 DIAGNOSIS — J45909 Unspecified asthma, uncomplicated: Secondary | ICD-10-CM | POA: Diagnosis not present

## 2020-04-06 DIAGNOSIS — Z8249 Family history of ischemic heart disease and other diseases of the circulatory system: Secondary | ICD-10-CM | POA: Diagnosis not present

## 2020-04-06 DIAGNOSIS — Z833 Family history of diabetes mellitus: Secondary | ICD-10-CM

## 2020-04-06 DIAGNOSIS — R42 Dizziness and giddiness: Secondary | ICD-10-CM | POA: Diagnosis not present

## 2020-04-06 DIAGNOSIS — G4700 Insomnia, unspecified: Secondary | ICD-10-CM | POA: Diagnosis present

## 2020-04-06 DIAGNOSIS — E669 Obesity, unspecified: Secondary | ICD-10-CM | POA: Diagnosis present

## 2020-04-06 DIAGNOSIS — F329 Major depressive disorder, single episode, unspecified: Secondary | ICD-10-CM | POA: Diagnosis present

## 2020-04-06 DIAGNOSIS — Z825 Family history of asthma and other chronic lower respiratory diseases: Secondary | ICD-10-CM | POA: Diagnosis not present

## 2020-04-06 DIAGNOSIS — Z79891 Long term (current) use of opiate analgesic: Secondary | ICD-10-CM

## 2020-04-06 DIAGNOSIS — A419 Sepsis, unspecified organism: Secondary | ICD-10-CM | POA: Diagnosis not present

## 2020-04-06 DIAGNOSIS — Z79899 Other long term (current) drug therapy: Secondary | ICD-10-CM

## 2020-04-06 DIAGNOSIS — E1165 Type 2 diabetes mellitus with hyperglycemia: Secondary | ICD-10-CM | POA: Diagnosis present

## 2020-04-06 DIAGNOSIS — E860 Dehydration: Secondary | ICD-10-CM | POA: Diagnosis present

## 2020-04-06 DIAGNOSIS — Z90710 Acquired absence of both cervix and uterus: Secondary | ICD-10-CM

## 2020-04-06 DIAGNOSIS — R0602 Shortness of breath: Secondary | ICD-10-CM | POA: Diagnosis not present

## 2020-04-06 DIAGNOSIS — Z888 Allergy status to other drugs, medicaments and biological substances status: Secondary | ICD-10-CM

## 2020-04-06 DIAGNOSIS — R55 Syncope and collapse: Secondary | ICD-10-CM | POA: Diagnosis not present

## 2020-04-06 DIAGNOSIS — Z8619 Personal history of other infectious and parasitic diseases: Secondary | ICD-10-CM

## 2020-04-06 DIAGNOSIS — Z881 Allergy status to other antibiotic agents status: Secondary | ICD-10-CM

## 2020-04-06 DIAGNOSIS — E876 Hypokalemia: Secondary | ICD-10-CM | POA: Diagnosis present

## 2020-04-06 DIAGNOSIS — R079 Chest pain, unspecified: Secondary | ICD-10-CM | POA: Diagnosis not present

## 2020-04-06 DIAGNOSIS — Z6836 Body mass index (BMI) 36.0-36.9, adult: Secondary | ICD-10-CM

## 2020-04-06 DIAGNOSIS — I10 Essential (primary) hypertension: Secondary | ICD-10-CM | POA: Diagnosis present

## 2020-04-06 DIAGNOSIS — Z885 Allergy status to narcotic agent status: Secondary | ICD-10-CM

## 2020-04-06 DIAGNOSIS — E1151 Type 2 diabetes mellitus with diabetic peripheral angiopathy without gangrene: Secondary | ICD-10-CM | POA: Diagnosis present

## 2020-04-06 DIAGNOSIS — Z20822 Contact with and (suspected) exposure to covid-19: Secondary | ICD-10-CM | POA: Diagnosis present

## 2020-04-06 DIAGNOSIS — Z794 Long term (current) use of insulin: Secondary | ICD-10-CM

## 2020-04-06 DIAGNOSIS — J302 Other seasonal allergic rhinitis: Secondary | ICD-10-CM | POA: Diagnosis present

## 2020-04-06 DIAGNOSIS — Z9049 Acquired absence of other specified parts of digestive tract: Secondary | ICD-10-CM

## 2020-04-06 DIAGNOSIS — R6510 Systemic inflammatory response syndrome (SIRS) of non-infectious origin without acute organ dysfunction: Secondary | ICD-10-CM | POA: Diagnosis not present

## 2020-04-06 DIAGNOSIS — IMO0002 Reserved for concepts with insufficient information to code with codable children: Secondary | ICD-10-CM | POA: Diagnosis present

## 2020-04-06 DIAGNOSIS — K429 Umbilical hernia without obstruction or gangrene: Secondary | ICD-10-CM | POA: Diagnosis not present

## 2020-04-06 LAB — CBC WITH DIFFERENTIAL/PLATELET
Abs Immature Granulocytes: 0 10*3/uL (ref 0.00–0.07)
Basophils Absolute: 0 10*3/uL (ref 0.0–0.1)
Basophils Relative: 0 %
Eosinophils Absolute: 0.1 10*3/uL (ref 0.0–0.5)
Eosinophils Relative: 2 %
HCT: 39 % (ref 36.0–46.0)
Hemoglobin: 12.8 g/dL (ref 12.0–15.0)
Immature Granulocytes: 0 %
Lymphocytes Relative: 29 %
Lymphs Abs: 1.6 10*3/uL (ref 0.7–4.0)
MCH: 27.5 pg (ref 26.0–34.0)
MCHC: 32.8 g/dL (ref 30.0–36.0)
MCV: 83.7 fL (ref 80.0–100.0)
Monocytes Absolute: 0.5 10*3/uL (ref 0.1–1.0)
Monocytes Relative: 10 %
Neutro Abs: 3.2 10*3/uL (ref 1.7–7.7)
Neutrophils Relative %: 59 %
Platelets: 247 10*3/uL (ref 150–400)
RBC: 4.66 MIL/uL (ref 3.87–5.11)
RDW: 14.1 % (ref 11.5–15.5)
WBC: 5.4 10*3/uL (ref 4.0–10.5)
nRBC: 0 % (ref 0.0–0.2)

## 2020-04-06 LAB — COMPREHENSIVE METABOLIC PANEL
ALT: 13 U/L (ref 0–44)
AST: 21 U/L (ref 15–41)
Albumin: 4 g/dL (ref 3.5–5.0)
Alkaline Phosphatase: 89 U/L (ref 38–126)
Anion gap: 13 (ref 5–15)
BUN: 13 mg/dL (ref 6–20)
CO2: 26 mmol/L (ref 22–32)
Calcium: 9 mg/dL (ref 8.9–10.3)
Chloride: 101 mmol/L (ref 98–111)
Creatinine, Ser: 0.93 mg/dL (ref 0.44–1.00)
GFR calc Af Amer: >60 mL/min (ref >60)
GFR calc non Af Amer: >60 mL/min (ref >60)
Glucose, Bld: 162 mg/dL — ABNORMAL HIGH (ref 70–99)
Potassium: 2.7 mmol/L — CL (ref 3.5–5.1)
Sodium: 140 mmol/L (ref 135–145)
Total Bilirubin: 0.5 mg/dL (ref 0.3–1.2)
Total Protein: 8.1 g/dL (ref 6.5–8.1)

## 2020-04-06 LAB — TROPONIN I (HIGH SENSITIVITY)
Troponin I (High Sensitivity): 4 ng/L (ref <18)
Troponin I (High Sensitivity): 4 ng/L (ref <18)

## 2020-04-06 LAB — URINALYSIS, ROUTINE W REFLEX MICROSCOPIC
Bilirubin Urine: NEGATIVE
Glucose, UA: NEGATIVE mg/dL
Hgb urine dipstick: NEGATIVE
Ketones, ur: NEGATIVE mg/dL
Leukocytes,Ua: NEGATIVE
Nitrite: NEGATIVE
Protein, ur: NEGATIVE mg/dL
Specific Gravity, Urine: 1.015 (ref 1.005–1.030)
pH: 7 (ref 5.0–8.0)

## 2020-04-06 LAB — LACTIC ACID, PLASMA
Lactic Acid, Venous: 3 mmol/L (ref 0.5–1.9)
Lactic Acid, Venous: 1.4 mmol/L (ref 0.5–1.9)

## 2020-04-06 LAB — PROTIME-INR
INR: 1 (ref 0.8–1.2)
Prothrombin Time: 12.6 seconds (ref 11.4–15.2)

## 2020-04-06 LAB — APTT: aPTT: 25 seconds (ref 24–36)

## 2020-04-06 LAB — MAGNESIUM: Magnesium: 1.5 mg/dL — ABNORMAL LOW (ref 1.7–2.4)

## 2020-04-06 LAB — D-DIMER, QUANTITATIVE: D-Dimer, Quant: 0.37 ug/mL-FEU (ref 0.00–0.50)

## 2020-04-06 LAB — SARS CORONAVIRUS 2 BY RT PCR (HOSPITAL ORDER, PERFORMED IN ~~LOC~~ HOSPITAL LAB): SARS Coronavirus 2: NEGATIVE

## 2020-04-06 MED ORDER — PIPERACILLIN-TAZOBACTAM 3.375 G IVPB
3.3750 g | Freq: Three times a day (TID) | INTRAVENOUS | Status: DC
  Administered 2020-04-07 – 2020-04-08 (×4): 3.375 g via INTRAVENOUS
  Filled 2020-04-06 (×4): qty 50

## 2020-04-06 MED ORDER — SODIUM CHLORIDE 0.9 % IV BOLUS
1000.0000 mL | Freq: Once | INTRAVENOUS | Status: AC
  Administered 2020-04-06: 1000 mL via INTRAVENOUS

## 2020-04-06 MED ORDER — MAGNESIUM SULFATE 2 GM/50ML IV SOLN
2.0000 g | Freq: Once | INTRAVENOUS | Status: AC
  Administered 2020-04-06: 2 g via INTRAVENOUS
  Filled 2020-04-06: qty 50

## 2020-04-06 MED ORDER — PIPERACILLIN-TAZOBACTAM 3.375 G IVPB 30 MIN
3.3750 g | Freq: Once | INTRAVENOUS | Status: AC
  Administered 2020-04-06: 3.375 g via INTRAVENOUS
  Filled 2020-04-06 (×2): qty 50

## 2020-04-06 MED ORDER — VANCOMYCIN HCL IN DEXTROSE 1-5 GM/200ML-% IV SOLN
1000.0000 mg | Freq: Once | INTRAVENOUS | Status: AC
  Administered 2020-04-06: 1000 mg via INTRAVENOUS
  Filled 2020-04-06: qty 200

## 2020-04-06 MED ORDER — VANCOMYCIN HCL IN DEXTROSE 1-5 GM/200ML-% IV SOLN
1000.0000 mg | INTRAVENOUS | Status: DC
  Administered 2020-04-07: 1000 mg via INTRAVENOUS
  Filled 2020-04-06: qty 200

## 2020-04-06 MED ORDER — POTASSIUM CHLORIDE 10 MEQ/100ML IV SOLN
10.0000 meq | INTRAVENOUS | Status: AC
  Administered 2020-04-06 (×3): 10 meq via INTRAVENOUS
  Filled 2020-04-06 (×3): qty 100

## 2020-04-06 MED ORDER — FENTANYL CITRATE (PF) 100 MCG/2ML IJ SOLN
50.0000 ug | Freq: Once | INTRAMUSCULAR | Status: AC
  Administered 2020-04-06: 50 ug via INTRAVENOUS
  Filled 2020-04-06: qty 2

## 2020-04-06 MED ORDER — HYDROMORPHONE HCL 1 MG/ML IJ SOLN
1.0000 mg | Freq: Once | INTRAMUSCULAR | Status: AC
  Administered 2020-04-06: 1 mg via INTRAVENOUS
  Filled 2020-04-06: qty 1

## 2020-04-06 MED ORDER — IOHEXOL 300 MG/ML  SOLN
100.0000 mL | Freq: Once | INTRAMUSCULAR | Status: AC | PRN
  Administered 2020-04-06: 100 mL via INTRAVENOUS

## 2020-04-06 MED ORDER — POTASSIUM CHLORIDE CRYS ER 20 MEQ PO TBCR
40.0000 meq | EXTENDED_RELEASE_TABLET | Freq: Once | ORAL | Status: AC
  Administered 2020-04-06: 40 meq via ORAL
  Filled 2020-04-06: qty 2

## 2020-04-06 MED ORDER — ONDANSETRON HCL 4 MG/2ML IJ SOLN
4.0000 mg | Freq: Once | INTRAMUSCULAR | Status: AC
  Administered 2020-04-06: 4 mg via INTRAVENOUS
  Filled 2020-04-06: qty 2

## 2020-04-06 MED ORDER — MAGNESIUM SULFATE 50 % IJ SOLN: 1.0000 g | Freq: Once | INTRAMUSCULAR | Status: DC

## 2020-04-06 NOTE — ED Provider Notes (Signed)
[facility] EMERGENCY DEPARTMENT Provider Note   CSN: [id number] Arrival date & time: 04/06/20  1148     History Chief Complaint  Patient presents with  . Shortness of Breath  . Chest Pain    Heather Walker is a 59 y.o. female.  59 year old female with past medical history of diabetes, hypertension and additional history as listed below with complaint of pain in her right shoulder blade area which radiates around under her right breast.  Pain started early this morning, is constant, is worse with taking a deep breath, associated with chills.  Denies diaphoresis, abdominal pain, nausea, vomiting, changes in bowel or bladder habits.  Patient states that her pain is constant, last time she had similar pain to this was in 2018 when she came to the emergency room for pain in her hip and was admitted for sepsis.  No other complaints or concerns, denies falls or injuries.        Past Medical History:  Diagnosis Date  . Arthritis   . Asthma   . Depression   . Diabetes mellitus without complication ([place])   . Heart murmur   . Hypertension     Patient Active Problem List   Diagnosis Date Noted  . SIRS (systemic inflammatory response syndrome) ([place]) 04/06/2020  . Pneumonia due to COVID-19 virus 05/14/2019  . COVID-19 virus infection 05/13/2019  . Renal insufficiency 03/26/2019  . At risk for cardiovascular event 03/26/2019  . Allergic reaction due to correct medicinal substance properly administered 08/23/2018  . Hypertriglyceridemia 08/05/2018  . Asthma, chronic, unspecified asthma severity, uncomplicated 27/25/3664  . Chronic right shoulder pain 08/05/2018  . Muscle spasm 08/05/2018  . Insomnia 10/05/2017  . Sepsis ([place]) 08/21/2017  . Acute right hip pain 08/21/2017  . Preventative health care 11/20/2016  . Right ankle pain 05/09/2016  . Palpitations 12/08/2014  . DM (diabetes mellitus) type II uncontrolled, periph vascular disorder ([place]) 08/03/2014  . Leg wound,  right 08/03/2014  . Hyperglycemia 07/13/2014  . Chest pain 07/12/2014  . Diabetes mellitus ([place]) 07/12/2014  . Acute kidney injury ([place]) 07/12/2014  . Abdominal pain, right upper quadrant 05/28/2014  . Tachycardia 05/03/2013  . Abscess 11/13/2012  . Breast pain, right 08/10/2012  . Osteoarthritis of left shoulder 03/04/2011  . DEPRESSION, MAJOR, RECURRENT, SEVERE 12/22/2009  . BACK PAIN, LUMBAR, WITH RADICULOPATHY 09/17/2009  . CALF PAIN, RIGHT 09/17/2009  . RENAL CYST 08/28/2009  . LOW BACK PAIN, ACUTE 11/13/2008  . FIBROIDS, UTERUS 09/10/2007  . UTI 09/03/2007  . BACTERIAL VAGINITIS 09/03/2007  . PELVIC PAIN, RIGHT 09/03/2007  . Hypokalemia 09/03/2007  . MOLE 06/21/2007  . ALLERGIC RHINITIS 06/21/2007  . HEMOCCULT POSITIVE STOOL 06/21/2007  . SINUSITIS, ACUTE NOS 04/05/2007  . REFLUX, ESOPHAGEAL 04/05/2007  . CHEST PAIN 04/05/2007  . Essential hypertension 03/19/2007  . ALLERGIC RHINITIS, SEASONAL 03/09/2007  . ASTHMA 03/09/2007    Past Surgical History:  Procedure Laterality Date  . ABDOMINAL HYSTERECTOMY    . APPENDECTOMY    . BLADDER SUSPENSION    . CESAREAN SECTION     3 previous  . TEE WITHOUT CARDIOVERSION N/A 08/24/2017   Procedure: TRANSESOPHAGEAL ECHOCARDIOGRAM (TEE);  Surgeon: Larey Dresser, MD;  Location: [facility];  Service: Cardiovascular;  Laterality: N/A;  . TUBAL LIGATION       OB History    Gravida  3   Para  3   Term  3   Preterm      AB  Living  3     SAB      TAB      Ectopic      Multiple      Live Births              Family History  Problem Relation Age of Onset  . Hypertension Mother   . Asthma Mother   . Hypertension Sister   . Asthma Sister   . Diabetes Sister   . Hypertension Maternal Grandmother   . Heart defect Sister   . Asthma Sister   . Aneurysm Sister   . Asthma Sister     Social History   Tobacco Use  . Smoking status: Never Smoker  . Smokeless tobacco: Never Used  Vaping Use  .  Vaping Use: Never used  Substance Use Topics  . Alcohol use: No  . Drug use: No    Home Medications Prior to Admission medications   Medication Sig Start Date End Date Taking? Authorizing Provider  amLODipine (NORVASC) 5 MG tablet Take 1 tablet (5 mg total) by mouth daily. 02/07/20   Roma Schanz R, DO  cloNIDine (CATAPRES) 0.2 MG tablet TAKE 1 TABLET(0.2 MG) BY MOUTH TWICE DAILY 10/21/19   Carollee Herter, Kendrick Fries R, DO  glucose blood (ONETOUCH VERIO) test strip 1 each by Other route 2 (two) times daily. Use as instructed 12/04/19   Shamleffer, Melanie Crazier, MD  insulin isophane & regular human (NOVOLIN 70/30 FLEXPEN) (70-30) 100 UNIT/ML KwikPen Inject 22 Units into the skin 2 (two) times daily with a meal. 03/03/20   Shamleffer, Melanie Crazier, MD  Insulin Pen Needle 32G X 4 MM MISC Inject 1 Device into the skin in the morning and at bedtime. 2x daily 12/04/19   Shamleffer, Melanie Crazier, MD  Lancets [facility] ULTRASOFT) lancets Use as instructed to test blood sugar 2 times daily E11.65 12/11/19   Shamleffer, Melanie Crazier, MD  NOVOLOG MIX 70/30 FLEXPEN (70-30) 100 UNIT/ML FlexPen  02/17/20   [provider]  OneTouch Delica Lancets 62X MISC 1 Package by Does not apply route in the morning and at bedtime. Use as instructed 2 times daily E11.65 02/20/20   Shamleffer, Melanie Crazier, MD  Semaglutide,0.25 or 0.5MG /DOS, (OZEMPIC, 0.25 OR 0.5 MG/DOSE,) 2 MG/1.5ML SOPN Inject 0.5 mg into the skin once a week. 03/03/20   Shamleffer, Melanie Crazier, MD  traZODone (DESYREL) 50 MG tablet TAKE 1/2 TO 1 TABLET(25 TO 50 MG) BY MOUTH AT BEDTIME AS NEEDED FOR SLEEP 02/11/20   Carollee Herter, Alferd Apa, DO  triamcinolone cream (KENALOG) 0.1 % Apply 1 application topically 2 (two) times daily. 03/03/20   [name], DO  triamterene-hydrochlorothiazide (MAXZIDE) 75-50 MG tablet Take 1 tablet by mouth daily. 02/14/20   [name], DO    Allergies    Crestor [rosuvastatin calcium],  Metformin and related, Atorvastatin, and Codeine  Review of Systems   Review of Systems  Constitutional: Positive for chills. Negative for diaphoresis and fever.  Respiratory: Positive for shortness of breath.   Cardiovascular: Positive for chest pain.  Gastrointestinal: Negative for abdominal pain, constipation, diarrhea, nausea and vomiting.  Genitourinary: Negative for dysuria and frequency.  Musculoskeletal: Positive for arthralgias.  Skin: Negative for rash and wound.  Allergic/Immunologic: Positive for immunocompromised state.  Neurological: Negative for weakness.  Psychiatric/Behavioral: Negative for confusion.  All other systems reviewed and are negative.   Physical Exam Updated Vital Signs BP (!) 155/103 (BP Location: Right Arm)   Pulse 90  Temp 98.4 F (36.9 C) (Oral)   Resp 19   Ht 5\' 2"  (1.575 m)   Wt 89.8 kg   SpO2 97%   BMI 36.21 kg/m   Physical Exam Vitals and nursing note reviewed.  Constitutional:      General: She is not in acute distress.    Appearance: She is well-developed. She is obese. She is not diaphoretic.     Comments: Tearful, appears to be in pain  HENT:     Head: Normocephalic and atraumatic.  Cardiovascular:     Rate and Rhythm: Normal rate and regular rhythm.  Pulmonary:     Effort: Pulmonary effort is normal.     Breath sounds: No decreased breath sounds.  Chest:     Chest wall: No tenderness.  Abdominal:     Palpations: Abdomen is soft.     Tenderness: There is no abdominal tenderness.  Musculoskeletal:     Right shoulder: Normal. No swelling, tenderness or bony tenderness. Normal range of motion. Normal strength. Normal pulse.     Cervical back: Neck supple.     Right lower leg: No edema.     Left lower leg: No edema.  Skin:    General: Skin is warm and dry.     Findings: No erythema or rash.  Neurological:     Mental Status: She is alert and oriented to person, place, and time.  Psychiatric:        Behavior: Behavior  normal.     ED Results / Procedures / Treatments   Labs (all labs ordered are listed, but only abnormal results are displayed) Labs Reviewed  COMPREHENSIVE METABOLIC PANEL - Abnormal; Notable for the following components:      Result Value   Potassium 2.7 (*)    Glucose, Bld 162 (*)    All other components within normal limits  LACTIC ACID, PLASMA - Abnormal; Notable for the following components:   Lactic Acid, Venous 3.0 (*)    All other components within normal limits  MAGNESIUM - Abnormal; Notable for the following components:   Magnesium 1.5 (*)    All other components within normal limits  SARS CORONAVIRUS 2 BY RT PCR (HOSPITAL ORDER, [facility])  CULTURE, BLOOD (SINGLE)  CULTURE, BLOOD (SINGLE)  URINE CULTURE  CBC WITH DIFFERENTIAL/PLATELET  LACTIC ACID, PLASMA  URINALYSIS, ROUTINE W REFLEX MICROSCOPIC  APTT  PROTIME-INR  D-DIMER, QUANTITATIVE (NOT AT [facility])  TROPONIN I (HIGH SENSITIVITY)  TROPONIN I (HIGH SENSITIVITY)    EKG EKG Interpretation  Date/Time:  Monday April 06 2020 12:49:38 EDT Ventricular Rate:  80 PR Interval:    QRS Duration: 98 QT Interval:  380 QTC Calculation: 439 R Axis:   -52 Text Interpretation: Sinus rhythm Left anterior fascicular block Abnormal R-wave progression, early transition Probable left ventricular hypertrophy Baseline wander in lead(s) I III aVL Otherwise no STEMI Confirmed by Octaviano Glow [phone]) on 04/06/2020 1:08:54 PM   Radiology CT Chest W Contrast  Result Date: 04/06/2020 CLINICAL DATA:  Right-sided shoulder pain and chest pain. EXAM: CT CHEST, ABDOMEN, AND PELVIS WITH CONTRAST TECHNIQUE: Multidetector CT imaging of the chest, abdomen and pelvis was performed following the standard protocol during bolus administration of intravenous contrast. CONTRAST:  168mL OMNIPAQUE IOHEXOL 300 MG/ML  SOLN COMPARISON:  May 02, 2013 FINDINGS: CT CHEST FINDINGS Cardiovascular: No significant vascular findings.  Normal heart size. No pericardial effusion. Mediastinum/Nodes: No enlarged mediastinal, hilar, or axillary lymph nodes. Thyroid gland, trachea, and esophagus demonstrate no  significant findings. Lungs/Pleura: Lungs are clear. No pleural effusion or pneumothorax. Musculoskeletal: Multilevel degenerative changes seen throughout the thoracic spine. CT ABDOMEN PELVIS FINDINGS Hepatobiliary: No focal liver abnormality is seen. Status post cholecystectomy. No biliary dilatation. Pancreas: Unremarkable. No pancreatic ductal dilatation or surrounding inflammatory changes. Spleen: Normal in size without focal abnormality. Adrenals/Urinary Tract: Adrenal glands are unremarkable. Kidneys are normal, without renal calculi, focal lesion, or hydronephrosis. Bladder is unremarkable. Stomach/Bowel: Stomach is within normal limits. The appendix is not identified. No evidence of bowel wall thickening, distention, or inflammatory changes. Vascular/Lymphatic: No significant vascular findings are present. No enlarged abdominal or pelvic lymph nodes. Reproductive: Status post hysterectomy. No adnexal masses. Other: There is a 1.9 cm x 2.9 cm fat containing umbilical hernia. Musculoskeletal: Multilevel degenerative changes seen throughout the lumbar spine. IMPRESSION: 1. No CT evidence of acute intrathoracic or intra-abdominal pathology. 2. 1.9 cm x 2.9 cm fat containing umbilical hernia. 3. Evidence of prior cholecystectomy. Electronically Signed   By: Virgina Norfolk M.D.   On: 04/06/2020 15:30   CT Abdomen Pelvis W Contrast  Result Date: 04/06/2020 CLINICAL DATA:  Right-sided shoulder pain and right sided chest pain. EXAM: CT CHEST, ABDOMEN, AND PELVIS WITH CONTRAST TECHNIQUE: Multidetector CT imaging of the chest, abdomen and pelvis was performed following the standard protocol during bolus administration of intravenous contrast. CONTRAST:  16mL OMNIPAQUE IOHEXOL 300 MG/ML  SOLN COMPARISON:  None. FINDINGS: CT CHEST FINDINGS  Cardiovascular: No significant vascular findings. Normal heart size. No pericardial effusion. Mediastinum/Nodes: No enlarged mediastinal, hilar, or axillary lymph nodes. Thyroid gland, trachea, and esophagus demonstrate no significant findings. Lungs/Pleura: Lungs are clear. No pleural effusion or pneumothorax. Musculoskeletal: Multilevel degenerative changes seen throughout the thoracic spine. CT ABDOMEN PELVIS FINDINGS Hepatobiliary: No focal liver abnormality is seen. Status post cholecystectomy. No biliary dilatation. Pancreas: Unremarkable. No pancreatic ductal dilatation or surrounding inflammatory changes. Spleen: Normal in size without focal abnormality. Adrenals/Urinary Tract: Adrenal glands are unremarkable. Kidneys are normal, without renal calculi, focal lesion, or hydronephrosis. Bladder is unremarkable. Stomach/Bowel: Stomach is within normal limits. The appendix is not identified. No evidence of bowel wall thickening, distention, or inflammatory changes. Vascular/Lymphatic: No significant vascular findings are present. No enlarged abdominal or pelvic lymph nodes. Reproductive: Status post hysterectomy. No adnexal masses. Other: There is a 1.9 cm x 2.9 cm fat containing umbilical hernia. Musculoskeletal: Multilevel degenerative changes seen throughout the lumbar spine. IMPRESSION: 1. No CT evidence of acute intrathoracic or intra-abdominal pathology. 2. 1.9 cm x 2.9 cm fat containing umbilical hernia. 3. Evidence of prior cholecystectomy. Electronically Signed   By: Virgina Norfolk M.D.   On: 04/06/2020 15:30   DG Chest Port 1 View  Result Date: 04/06/2020 CLINICAL DATA:  Shortness of breath. Additional provided: Patient reports pain starting in right shoulder which radiates under right breast and into chest, pain with breathing. EXAM: PORTABLE CHEST 1 VIEW COMPARISON:  Prior chest radiographs 05/13/2019 and earlier FINDINGS: Heart size within normal limits. There is no appreciable airspace  consolidation. No frank pulmonary edema. No evidence of pleural effusion or pneumothorax. No acute bony abnormality identified. IMPRESSION: No evidence of acute cardiopulmonary abnormality. Electronically Signed   By: Kellie Simmering DO   On: 04/06/2020 13:20    Procedures .Critical Care Performed by: Tacy Learn, PA-C Authorized by: Tacy Learn, PA-C   Critical care provider statement:    Critical care time (minutes):  45   Critical care was time spent personally by me on the following activities:  Discussions with  consultants, evaluation of patient's response to treatment, examination of patient, ordering and performing treatments and interventions, ordering and review of laboratory studies, ordering and review of radiographic studies, pulse oximetry, re-evaluation of patient's condition, obtaining history from patient or surrogate and review of old charts   (including critical care time)  Medications Ordered in ED Medications  piperacillin-tazobactam (ZOSYN) IVPB 3.375 g (0 g Intravenous Stopped 04/06/20 1608)  vancomycin (VANCOCIN) IVPB 1000 mg/200 mL premix (0 mg Intravenous Stopped 04/06/20 1608)  magnesium sulfate IVPB 2 g 50 mL (2 g Intravenous New Bag/Given 04/06/20 1706)  sodium chloride 0.9 % bolus 1,000 mL (0 mLs Intravenous Stopped 04/06/20 1355)  potassium chloride SA (KLOR-CON) CR tablet 40 mEq (40 mEq Oral Given 04/06/20 1347)  potassium chloride 10 mEq in 100 mL IVPB (10 mEq Intravenous New Bag/Given 04/06/20 1622)  fentaNYL (SUBLIMAZE) injection 50 mcg (50 mcg Intravenous Given 04/06/20 1349)  vancomycin (VANCOCIN) IVPB 1000 mg/200 mL premix (0 mg Intravenous Stopped 04/06/20 1551)  piperacillin-tazobactam (ZOSYN) IVPB 3.375 g (0 g Intravenous Stopped 04/06/20 1551)  iohexol (OMNIPAQUE) 300 MG/ML solution 100 mL (100 mLs Intravenous Contrast Given 04/06/20 1456)  HYDROmorphone (DILAUDID) injection 1 mg (1 mg Intravenous Given 04/06/20 1554)  sodium chloride 0.9 % bolus 1,000 mL  (0 mLs Intravenous Stopped 04/06/20 1707)    ED Course  I have reviewed the triage vital signs and the nursing notes.  Pertinent labs & imaging results that were available during my care of the patient were reviewed by me and considered in my medical decision making (see chart for details).  Clinical Course as of Apr 07 1735  Mon Apr 06, 2313  [id number] 59 year old female with history of bacteremia admitted to the hospital in November 2018 presents with complaint of pain in her right shoulder with chills, states last time she had pain like this was when she had pain in her hip with her prior sepsis admission. On exam, patient is tearful with rigors, no pain with palpation of the shoulder or with ROM. History of MSSA bacteremia on November 2018 admission.  Patient with elevated lactic acid at 3.0, repeat is pending.  CBC, urinalysis, troponin x1 unremarkable.  CMP with hypokalemia with potassium of 2.7.  We will add magnesium level, given oral as well as IV potassium.  Patient was given broad-spectrum antibiotics including Zosyn and vancomycin due to history and elevated lactic acid.  Also given IV fluids.  Patient is not hypotensive or tachycardic, will hold off on full 30/kg bolus at this time.   [LM]  1478 This is a 59 year old female presenting to emergency department with rigors and right shoulder pain for the past 24 hours.  Patient has a history of admission for MSSA bacteremia to the hospital November 2018.  No specific source was ever found for her infection at that time, but she was treated with prolonged course of antibiotics.  She returns today complaining of very similar body sensation and feeling to that prior infection.  On exam the patient is having rigors.  She does appear uncomfortable.  Her lungs are clear to auscultation.  She is breathing heavily but does not appear to have any focal abdominal tenderness despite.  She does report a history of a cholecystectomy.  Her work-up here shows an  unremarkable UA and no focal findings on chest x-ray.  She also had Covid the illness a year ago as well as both vaccines.  Is unclear what the source of her symptoms may be this time.  Her lactate is elevated at 3.0.  I would treat her for sepsis with unclear source.  She is pending CT scan of the chest abdomen pelvis.  She is also receiving broad-spectrum antibiotics with IV vancomycin and Zosyn.  I would recommend medical admission for sepsis given her history and presentation.  She is otherwise stable in our ER   [MT]  1624 Case discussed with Dr. Thereasa Solo with [facility], plan is to admit to [facility] as soon as a bed is available, requests add on d-dimer to work up.    [LM]  1653 Magnesium returns at 1.5, will add IV magnesium.  D-dimer is negative.   [LM]    Clinical Course User Index [LM] Tacy Learn, PA-C [MT] Langston Masker Carola Rhine, MD   MDM Rules/Calculators/A&P                          Final Clinical Impression(s) / ED Diagnoses Final diagnoses:  Sepsis without acute organ dysfunction, due to unspecified organism [facility])  Hypokalemia  Hypomagnesemia    Rx / DC Orders ED Discharge Orders    None       Tacy Learn, PA-C 04/06/20 1736    Wyvonnia Dusky, MD 04/06/20 1746

## 2020-04-06 NOTE — ED Notes (Signed)
Pt given ice chips states feels better, talking on phone to daughter

## 2020-04-06 NOTE — ED Triage Notes (Signed)
Pt arrives with c/o pain starting in her right shoulder pt reports it radiates under right breast and into chest. Pt reports pain started this morning also states "it hurts to breath".

## 2020-04-06 NOTE — ED Notes (Signed)
To CT

## 2020-04-06 NOTE — Progress Notes (Signed)
   Discussed case with ED staff at [facility].  Patient accepted to telemetry bed at [facility].  Diagnosis is SIRS/sepsis of unknown etiology.  D-dimer pending, but patient has had CT chest with contrast (though not PE protocol).  In brief this is a 59 year old female with a history of MSSA bacteremia of unclear source November 2018 at which time she presented with isolated hip pain who presented to [facility] today with acute onset of severe right-sided shoulder/upper flank pain.  In the ED she has been tachypneic and at times tachycardic with rigors but no measured fever.  Her blood pressure is elevated.  Her saturations are stable.  CT chest abdomen and pelvis with contrast did not reveal a suspicious site of infection.  Patient requires inpatient evaluation for SIRS of unclear etiology to rule out a recurrent occult infection/bacteremia.  [name], MD [facility] Office  [phone]  04/06/2020, 4:28 PM

## 2020-04-06 NOTE — ED Notes (Signed)
Pt states feels better dozing on and off

## 2020-04-06 NOTE — ED Notes (Signed)
Patient stated that when she had the same pain in 2018, she stated that she had sepsis and was in the hospital for a week and was transferred to the nursing home for a month.

## 2020-04-06 NOTE — ED Notes (Signed)
Pt vomited about 400cc clear fluid , dr Earnest Conroy aware and ordering zofran

## 2020-04-06 NOTE — ED Notes (Signed)
Pt given ice chips , pts pad was wet under her she has purwick from the beginning , cannister changed and pt has new purwick and dry pads

## 2020-04-06 NOTE — Progress Notes (Addendum)
Pharmacy Antibiotic Note  Heather Walker is a 59 y.o. female admitted on 04/06/2020 with sepsis.  Pharmacy has been consulted for vancomycin and zosyn dosing.  Plan: Vancomycin 1000 mg IV every 24 hours.  Goal: AUC around 500 mcg/mL Zosyn 3.375g IV q8h (4 hour infusion).  Height: 5\' 2"  (157.5 cm) Weight: 89.8 kg (198 lb) IBW/kg (Calculated) : 50.1  Temp (24hrs), Avg:98.4 F (36.9 C), Min:98.4 F (36.9 C), Max:98.4 F (36.9 C)  Recent Labs  Lab 04/06/20 1216 04/06/20 1223 04/06/20 1246  WBC  --  5.4  --   CREATININE 0.93  --   --   LATICACIDVEN  --   --  3.0*    Estimated Creatinine Clearance: 69.5 mL/min (by C-G formula based on SCr of 0.93 mg/dL).    Allergies  Allergen Reactions  . Crestor [Rosuvastatin Calcium] Anaphylaxis  . Metformin And Related Diarrhea    Nausea and diarrhea  . Atorvastatin Other (See Comments)    Neck stiffness  . Codeine Itching    Antimicrobials this admission: vanccomycin 1 gram IV x 1 Zosyn 3.375 gm IV x 1   Microbiology results:  pending  Thank you for allowing pharmacy to be a part of this patient's care.  Mallie Mussel A Zompa 04/06/2020 2:10 PM

## 2020-04-06 NOTE — ED Notes (Signed)
Pt states she took her bp mds this am hence her voiding alot

## 2020-04-07 ENCOUNTER — Ambulatory Visit: Payer: Medicare HMO | Admitting: Internal Medicine

## 2020-04-07 ENCOUNTER — Encounter (HOSPITAL_COMMUNITY): Payer: Self-pay | Admitting: Internal Medicine

## 2020-04-07 ENCOUNTER — Telehealth: Payer: Medicare HMO

## 2020-04-07 DIAGNOSIS — E669 Obesity, unspecified: Secondary | ICD-10-CM | POA: Diagnosis present

## 2020-04-07 DIAGNOSIS — Z8249 Family history of ischemic heart disease and other diseases of the circulatory system: Secondary | ICD-10-CM | POA: Diagnosis not present

## 2020-04-07 DIAGNOSIS — F329 Major depressive disorder, single episode, unspecified: Secondary | ICD-10-CM | POA: Diagnosis present

## 2020-04-07 DIAGNOSIS — Z833 Family history of diabetes mellitus: Secondary | ICD-10-CM | POA: Diagnosis not present

## 2020-04-07 DIAGNOSIS — Z20822 Contact with and (suspected) exposure to covid-19: Secondary | ICD-10-CM | POA: Diagnosis present

## 2020-04-07 DIAGNOSIS — Z825 Family history of asthma and other chronic lower respiratory diseases: Secondary | ICD-10-CM | POA: Diagnosis not present

## 2020-04-07 DIAGNOSIS — E876 Hypokalemia: Secondary | ICD-10-CM

## 2020-04-07 DIAGNOSIS — E1151 Type 2 diabetes mellitus with diabetic peripheral angiopathy without gangrene: Secondary | ICD-10-CM | POA: Diagnosis present

## 2020-04-07 DIAGNOSIS — Z881 Allergy status to other antibiotic agents status: Secondary | ICD-10-CM | POA: Diagnosis not present

## 2020-04-07 DIAGNOSIS — R6510 Systemic inflammatory response syndrome (SIRS) of non-infectious origin without acute organ dysfunction: Secondary | ICD-10-CM | POA: Diagnosis not present

## 2020-04-07 DIAGNOSIS — M7501 Adhesive capsulitis of right shoulder: Secondary | ICD-10-CM | POA: Diagnosis present

## 2020-04-07 DIAGNOSIS — J302 Other seasonal allergic rhinitis: Secondary | ICD-10-CM | POA: Diagnosis present

## 2020-04-07 DIAGNOSIS — Z90710 Acquired absence of both cervix and uterus: Secondary | ICD-10-CM | POA: Diagnosis not present

## 2020-04-07 DIAGNOSIS — Z8619 Personal history of other infectious and parasitic diseases: Secondary | ICD-10-CM | POA: Diagnosis not present

## 2020-04-07 DIAGNOSIS — E860 Dehydration: Secondary | ICD-10-CM | POA: Diagnosis present

## 2020-04-07 DIAGNOSIS — Z79899 Other long term (current) drug therapy: Secondary | ICD-10-CM | POA: Diagnosis not present

## 2020-04-07 DIAGNOSIS — I10 Essential (primary) hypertension: Secondary | ICD-10-CM | POA: Diagnosis present

## 2020-04-07 DIAGNOSIS — Z79891 Long term (current) use of opiate analgesic: Secondary | ICD-10-CM | POA: Diagnosis not present

## 2020-04-07 DIAGNOSIS — G4700 Insomnia, unspecified: Secondary | ICD-10-CM | POA: Diagnosis present

## 2020-04-07 DIAGNOSIS — E1165 Type 2 diabetes mellitus with hyperglycemia: Secondary | ICD-10-CM | POA: Diagnosis present

## 2020-04-07 DIAGNOSIS — Z9049 Acquired absence of other specified parts of digestive tract: Secondary | ICD-10-CM | POA: Diagnosis not present

## 2020-04-07 DIAGNOSIS — Z794 Long term (current) use of insulin: Secondary | ICD-10-CM | POA: Diagnosis not present

## 2020-04-07 DIAGNOSIS — R42 Dizziness and giddiness: Secondary | ICD-10-CM | POA: Diagnosis not present

## 2020-04-07 DIAGNOSIS — Z8616 Personal history of COVID-19: Secondary | ICD-10-CM | POA: Diagnosis not present

## 2020-04-07 LAB — HEPATIC FUNCTION PANEL
ALT: 14 U/L (ref 0–44)
AST: 17 U/L (ref 15–41)
Albumin: 3.7 g/dL (ref 3.5–5.0)
Alkaline Phosphatase: 84 U/L (ref 38–126)
Bilirubin, Direct: 0.1 mg/dL (ref 0.0–0.2)
Indirect Bilirubin: 0.7 mg/dL (ref 0.3–0.9)
Total Bilirubin: 0.8 mg/dL (ref 0.3–1.2)
Total Protein: 7.4 g/dL (ref 6.5–8.1)

## 2020-04-07 LAB — CBC WITH DIFFERENTIAL/PLATELET
Abs Immature Granulocytes: 0.01 10*3/uL (ref 0.00–0.07)
Basophils Absolute: 0 10*3/uL (ref 0.0–0.1)
Basophils Relative: 0 %
Eosinophils Absolute: 0 10*3/uL (ref 0.0–0.5)
Eosinophils Relative: 1 %
HCT: 38 % (ref 36.0–46.0)
Hemoglobin: 12.3 g/dL (ref 12.0–15.0)
Immature Granulocytes: 0 %
Lymphocytes Relative: 22 %
Lymphs Abs: 1.2 10*3/uL (ref 0.7–4.0)
MCH: 27.2 pg (ref 26.0–34.0)
MCHC: 32.4 g/dL (ref 30.0–36.0)
MCV: 84.1 fL (ref 80.0–100.0)
Monocytes Absolute: 0.6 10*3/uL (ref 0.1–1.0)
Monocytes Relative: 11 %
Neutro Abs: 3.6 10*3/uL (ref 1.7–7.7)
Neutrophils Relative %: 66 %
Platelets: 248 10*3/uL (ref 150–400)
RBC: 4.52 MIL/uL (ref 3.87–5.11)
RDW: 14.3 % (ref 11.5–15.5)
WBC: 5.5 10*3/uL (ref 4.0–10.5)
nRBC: 0 % (ref 0.0–0.2)

## 2020-04-07 LAB — URINE CULTURE: Culture: <10000 — AB

## 2020-04-07 LAB — GLUCOSE, CAPILLARY
Glucose-Capillary: 104 mg/dL — ABNORMAL HIGH (ref 70–99)
Glucose-Capillary: 138 mg/dL — ABNORMAL HIGH (ref 70–99)
Glucose-Capillary: 111 mg/dL — ABNORMAL HIGH (ref 70–99)
Glucose-Capillary: 174 mg/dL — ABNORMAL HIGH (ref 70–99)

## 2020-04-07 LAB — BASIC METABOLIC PANEL
Anion gap: 11 (ref 5–15)
BUN: 8 mg/dL (ref 6–20)
CO2: 31 mmol/L (ref 22–32)
Calcium: 8.6 mg/dL — ABNORMAL LOW (ref 8.9–10.3)
Chloride: 98 mmol/L (ref 98–111)
Creatinine, Ser: 0.89 mg/dL (ref 0.44–1.00)
GFR calc Af Amer: >60 mL/min (ref >60)
GFR calc non Af Amer: >60 mL/min (ref >60)
Glucose, Bld: 116 mg/dL — ABNORMAL HIGH (ref 70–99)
Potassium: 2.9 mmol/L — ABNORMAL LOW (ref 3.5–5.1)
Sodium: 140 mmol/L (ref 135–145)

## 2020-04-07 LAB — SEDIMENTATION RATE: Sed Rate: 30 mm/hr — ABNORMAL HIGH (ref 0–22)

## 2020-04-07 LAB — LACTIC ACID, PLASMA: Lactic Acid, Venous: 1.2 mmol/L (ref 0.5–1.9)

## 2020-04-07 LAB — HIV ANTIBODY (ROUTINE TESTING W REFLEX): HIV Screen 4th Generation wRfx: NONREACTIVE

## 2020-04-07 LAB — MAGNESIUM: Magnesium: 1.7 mg/dL (ref 1.7–2.4)

## 2020-04-07 MED ORDER — POTASSIUM CHLORIDE CRYS ER 20 MEQ PO TBCR
40.0000 meq | EXTENDED_RELEASE_TABLET | ORAL | Status: AC
  Administered 2020-04-07 – 2020-04-08 (×2): 40 meq via ORAL
  Filled 2020-04-07 (×2): qty 2

## 2020-04-07 MED ORDER — DIPHENHYDRAMINE HCL 50 MG/ML IJ SOLN
INTRAMUSCULAR | Status: AC
  Administered 2020-04-07: 25 mg via INTRAVENOUS
  Filled 2020-04-07: qty 1

## 2020-04-07 MED ORDER — MORPHINE SULFATE (PF) 2 MG/ML IV SOLN
1.0000 mg | INTRAVENOUS | Status: DC | PRN
  Administered 2020-04-07 – 2020-04-10 (×3): 1 mg via INTRAVENOUS
  Filled 2020-04-07 (×4): qty 1

## 2020-04-07 MED ORDER — ENSURE ENLIVE PO LIQD: 237.0000 mL | Freq: Two times a day (BID) | ORAL | Status: DC

## 2020-04-07 MED ORDER — INSULIN ASPART 100 UNIT/ML ~~LOC~~ SOLN
0.0000 [IU] | Freq: Three times a day (TID) | SUBCUTANEOUS | Status: DC
  Administered 2020-04-07 – 2020-04-08 (×4): 1 [IU] via SUBCUTANEOUS
  Administered 2020-04-09 – 2020-04-10 (×2): 2 [IU] via SUBCUTANEOUS
  Administered 2020-04-10: 1 [IU] via SUBCUTANEOUS
  Administered 2020-04-10 – 2020-04-11 (×3): 2 [IU] via SUBCUTANEOUS
  Administered 2020-04-12: 1 [IU] via SUBCUTANEOUS

## 2020-04-07 MED ORDER — AMLODIPINE BESYLATE 5 MG PO TABS
5.0000 mg | ORAL_TABLET | Freq: Every day | ORAL | Status: DC
  Administered 2020-04-07 – 2020-04-10 (×4): 5 mg via ORAL
  Filled 2020-04-07 (×5): qty 1

## 2020-04-07 MED ORDER — INSULIN ISOPHANE & REGULAR (HUMAN 70-30)100 UNIT/ML KWIKPEN: 22.0000 [IU] | PEN_INJECTOR | Freq: Two times a day (BID) | SUBCUTANEOUS | Status: DC

## 2020-04-07 MED ORDER — INSULIN ASPART PROT & ASPART (70-30 MIX) 100 UNIT/ML ~~LOC~~ SUSP
22.0000 [IU] | Freq: Two times a day (BID) | SUBCUTANEOUS | Status: DC
  Administered 2020-04-08 – 2020-04-12 (×8): 22 [IU] via SUBCUTANEOUS
  Filled 2020-04-07: qty 10

## 2020-04-07 MED ORDER — ONDANSETRON HCL 4 MG PO TABS: 4.0000 mg | ORAL_TABLET | Freq: Four times a day (QID) | ORAL | Status: DC | PRN

## 2020-04-07 MED ORDER — CLONIDINE HCL 0.2 MG PO TABS
0.2000 mg | ORAL_TABLET | Freq: Two times a day (BID) | ORAL | Status: DC
  Administered 2020-04-07 – 2020-04-10 (×8): 0.2 mg via ORAL
  Filled 2020-04-07 (×9): qty 1

## 2020-04-07 MED ORDER — HYDROMORPHONE HCL 1 MG/ML IJ SOLN
1.0000 mg | Freq: Once | INTRAMUSCULAR | Status: AC
  Administered 2020-04-07: 1 mg via INTRAVENOUS
  Filled 2020-04-07: qty 1

## 2020-04-07 MED ORDER — DIPHENHYDRAMINE HCL 50 MG/ML IJ SOLN: 25.0000 mg | Freq: Once | INTRAMUSCULAR | Status: AC

## 2020-04-07 MED ORDER — ENOXAPARIN SODIUM 40 MG/0.4ML ~~LOC~~ SOLN
40.0000 mg | SUBCUTANEOUS | Status: DC
  Administered 2020-04-07 – 2020-04-11 (×4): 40 mg via SUBCUTANEOUS
  Filled 2020-04-07 (×6): qty 0.4

## 2020-04-07 MED ORDER — TRAZODONE HCL 50 MG PO TABS: 50.0000 mg | ORAL_TABLET | Freq: Every evening | ORAL | Status: DC | PRN

## 2020-04-07 MED ORDER — ONDANSETRON HCL 4 MG/2ML IJ SOLN: 4.0000 mg | Freq: Four times a day (QID) | INTRAMUSCULAR | Status: DC | PRN

## 2020-04-07 NOTE — Progress Notes (Signed)
PROGRESS NOTE    Heather Walker  ZOX:[id number] DOB: [birth year] DOA: 04/06/2020 PCP: [name], DO   Chief Complaint  Patient presents with  . Shortness of Breath  . Chest Pain    Brief Narrative:  Heather Walker is Heather Walker 59 y.o. female with history of diabetes mellitus type 2, hypertension who was admitted in 2018 for MSSA bacteremia of unclear source at that time patient was admitted with right hip pain.  Patient started having chills and rigors yesterday morning with right shoulder pain/upper flank.  Denies any nausea vomiting diarrhea chest pain or shortness of breath.  ED Course: In the ER patient was afebrile but tachycardic with elevated lactic acid levels.  Patient was given fluid bolus for possible developing sepsis patient's labs also showed hypokalemia of 2.7 hypomagnesemia 1.5 which were replaced.  Patient had CT angiogram of the chest abdomen pelvis which does not show anything acute.  Patient was empirically surrounded by admitted for further observation for possible developing sepsis given the previous history of MSSA bacteremia.  Patient's pain is controlled with pain relief medication at this time.  On exam patient does not have any rash on the abdomen or any spinal tenderness.  Covid testing negative UA unremarkable.  Assessment & Plan:   Principal Problem:   SIRS (systemic inflammatory response syndrome) (HCC) Active Problems:   Essential hypertension   Asthma   Hypokalemia   DM (diabetes mellitus) type II uncontrolled, periph vascular disorder (HCC)   Asthma, chronic, unspecified asthma severity, uncomplicated  1. SIRS source not clear.  Patient's pain is mostly localized in the right shoulder/upper flank.  No obvious rash.  Empiric antibiotics have been started after blood cultures were obtained.  Patient has had previous history of MSSA bacteremia with similar presentation in 2018.  Reportedly tachypneic and tachycardic with rigors int he ED.  Closely  monitor. 1. CT without acute intrathoracic or intraabdominal pathology 2. Consider MRI of R shoulder, but on my exam, no notable findings at this time, pain improved with pain meds, ROM intact 3. Urine cx < 10,000 cfu, blood cx pending 4. Continue vanc/zosyn for now  2. Hypokalemia and hypomagnesemia - replace and follow - diuretic on hold 3. Diabetes mellitus type 2 on insulin. 4. Hypertension on clonidine and amlodipine. 5. History of asthma not acutely wheezing.  Since patient had Heather Walker septic-like picture on presentation patient will need close monitoring for any further deterioration in inpatient status.  DVT prophylaxis: lovenox Code Status: full  Family Communication: none at bedside Disposition:   Status is: Inpatient  Remains inpatient appropriate because:Inpatient level of care appropriate due to severity of illness   Dispo: The patient is from: Home              Anticipated d/c is to: Home              Anticipated d/c date is: 1 day              Patient currently is not medically stable to d/c.  Consultants:   none  Procedures:   none  Antimicrobials:  Anti-infectives (From admission, onward)   Start     Dose/Rate Route Frequency Ordered Stop   04/07/20 1400  vancomycin (VANCOCIN) IVPB 1000 mg/200 mL premix     Discontinue     1,000 mg 200 mL/hr over 60 Minutes Intravenous Every 24 hours 04/06/20 1410     04/06/20 2200  piperacillin-tazobactam (ZOSYN) IVPB 3.375 g  Discontinue     3.375 g 12.5 mL/hr over 240 Minutes Intravenous Every 8 hours 04/06/20 1410     04/06/20 1415  piperacillin-tazobactam (ZOSYN) IVPB 3.375 g        3.375 g 100 mL/hr over 30 Minutes Intravenous  Once 04/06/20 1403 04/06/20 1551   04/06/20 1400  vancomycin (VANCOCIN) IVPB 1000 mg/200 mL premix        1,000 mg 200 mL/hr over 60 Minutes Intravenous  Once 04/06/20 1356 04/06/20 1551         Subjective: Sleepy Presented with R shoulder pain, improved now  Objective: Vitals:     04/07/20 0945 04/07/20 1347 04/07/20 1748 04/07/20 1950  BP: (!) 148/97 (!) 153/98 128/89 (!) 129/93  Pulse: 80 92 91 90  Resp: 19 18 16 18   Temp: 97.7 F (36.5 C) 98.4 F (36.9 C) 98.7 F (37.1 C) 98.5 F (36.9 C)  TempSrc: Oral Oral Oral   SpO2: 100% 97% 98% 95%  Weight:      Height:        Intake/Output Summary (Last 24 hours) at 04/07/2020 2017 Last data filed at 04/07/2020 1845 Gross per 24 hour  Intake 1608.7 ml  Output --  Net 1608.7 ml   Filed Weights   04/06/20 1202  Weight: 89.8 kg    Examination:  General exam: Appears calm and comfortable  Respiratory system: Clear to auscultation. Respiratory effort normal. Cardiovascular system: S1 & S2 heard, RRR.  Gastrointestinal system: Abdomen is nondistended, soft and nontender. Central nervous system: Alert and oriented. No focal neurological deficits. Extremities: R shoulder without tenderness, intact ROM Skin: No rashes, lesions or ulcers Psychiatry: Judgement and insight appear normal. Mood & affect appropriate.     Data Reviewed: I have personally reviewed following labs and imaging studies  CBC: Recent Labs  Lab 04/06/20 1223 04/07/20 0623  WBC 5.4 5.5  NEUTROABS 3.2 3.6  HGB 12.8 12.3  HCT 39.0 38.0  MCV 83.7 84.1  PLT 247 196    Basic Metabolic Panel: Recent Labs  Lab 04/06/20 1216 04/07/20 0623  NA 140 140  K 2.7* 2.9*  CL 101 98  CO2 26 31  GLUCOSE 162* 116*  BUN 13 8  CREATININE 0.93 0.89  CALCIUM 9.0 8.6*  MG 1.5* 1.7    GFR: Estimated Creatinine Clearance: 72.7 mL/min (by C-G formula based on SCr of 0.89 mg/dL).  Liver Function Tests: Recent Labs  Lab 04/06/20 1216 04/07/20 0623  AST 21 17  ALT 13 14  ALKPHOS 89 84  BILITOT 0.5 0.8  PROT 8.1 7.4  ALBUMIN 4.0 3.7    CBG: Recent Labs  Lab 04/07/20 0806 04/07/20 1212 04/07/20 1658 04/07/20 1957  GLUCAP 104* 138* 111* 174*     Recent Results (from the past 240 hour(s))  Culture, blood (single)     Status:  None (Preliminary result)   Collection Time: 04/06/20 12:23 PM   Specimen: BLOOD  Result Value Ref Range Status   Specimen Description   Final    BLOOD RIGHT ANTECUBITAL Performed at [facility], [place] [address] [place], [place] [address]    Special Requests   Final    BOTTLES DRAWN AEROBIC AND ANAEROBIC Blood Culture adequate volume Performed at [facility], [address] [place], [place] [address]    Culture   Final    NO GROWTH < 24 HOURS Performed at [facility], [place] [address], [place], [place] [address]    Report  Status PENDING  Incomplete  Culture, blood (single)     Status: None (Preliminary result)   Collection Time: 04/06/20  1:55 PM   Specimen: BLOOD  Result Value Ref Range Status   Specimen Description   Final    BLOOD LEFT ANTECUBITAL Performed at [facility], [place] [address], [place], [place] [address]    Special Requests   Final    BOTTLES DRAWN AEROBIC AND ANAEROBIC Blood Culture results may not be optimal due to an inadequate volume of blood received in culture bottles Performed at [facility], [address] [place], [place] [address]    Culture   Final    NO GROWTH < 24 HOURS Performed at [facility], [place] [address] [place], [place] [address]    Report Status PENDING  Incomplete  Urine culture     Status: Abnormal   Collection Time: 04/06/20  1:55 PM   Specimen: In/Out Cath Urine  Result Value Ref Range Status   Specimen Description   Final    IN/OUT CATH URINE Performed at [facility], [address] [place], [place] [address]    Special Requests   Final    NONE Performed at [facility], [address] [place], [place] [address]    Culture (Acel Natzke)  Final    <10,000 COLONIES/mL MULTIPLE SPECIES PRESENT, SUGGEST RECOLLECTION   Report Status 04/07/2020 FINAL  Final  SARS Coronavirus 2 by RT PCR (hospital order, performed in [facility] hospital lab) Nasopharyngeal  Nasopharyngeal Swab     Status: None   Collection Time: 04/06/20  2:08 PM   Specimen: Nasopharyngeal Swab  Result Value Ref Range Status   SARS Coronavirus 2 NEGATIVE NEGATIVE Final    Comment: (NOTE) SARS-CoV-2 target nucleic acids are NOT DETECTED.  The SARS-CoV-2 RNA is generally detectable in upper and lower respiratory specimens during the acute phase of infection. The lowest concentration of SARS-CoV-2 viral copies this assay can detect is 250 copies / mL. Jaquane Boughner negative result does not preclude SARS-CoV-2 infection and should not be used as the sole basis for treatment or other patient management decisions.  Mervin Ramires negative result may occur with improper specimen collection / handling, submission of specimen other than nasopharyngeal swab, presence of viral mutation(s) within the areas targeted by this assay, and inadequate number of viral copies (<250 copies / mL). Juleen Sorrels negative result must be combined with clinical observations, patient history, and epidemiological information.  Fact Sheet for Patients:   StrictlyIdeas.no  Fact Sheet for Healthcare Providers: BankingDealers.co.za  This test is not yet approved or  cleared by the [place] FDA and has been authorized for detection and/or diagnosis of SARS-CoV-2 by FDA under an Emergency Use Authorization (EUA).  This EUA will remain in effect (meaning this test can be used) for the duration of the COVID-19 declaration under Section 564(b)(1) of the Act, 21 U.S.C. section 360bbb-3(b)(1), unless the authorization is terminated or revoked sooner.  Performed at [facility], [address] [place], [place] [address]          Radiology Studies: CT Chest W Contrast  Result Date: 04/06/2020 CLINICAL DATA:  Right-sided shoulder pain and chest pain. EXAM: CT CHEST, ABDOMEN, AND PELVIS WITH CONTRAST TECHNIQUE: Multidetector CT imaging of the chest, abdomen and pelvis was  performed following the standard protocol during bolus administration of intravenous contrast. CONTRAST:  19mL OMNIPAQUE IOHEXOL 300 MG/ML  SOLN COMPARISON:  May 02, 2013 FINDINGS: CT CHEST FINDINGS Cardiovascular: No significant vascular findings. Normal heart size. No pericardial effusion. Mediastinum/Nodes: No enlarged mediastinal, hilar, or axillary lymph nodes. Thyroid gland, trachea, and esophagus demonstrate no significant findings. Lungs/Pleura: Lungs are clear. No pleural effusion or pneumothorax. Musculoskeletal: Multilevel degenerative changes seen throughout the thoracic spine. CT ABDOMEN PELVIS FINDINGS Hepatobiliary: No focal liver abnormality is seen. Status post cholecystectomy. No biliary dilatation. Pancreas: Unremarkable. No pancreatic ductal dilatation or surrounding inflammatory changes. Spleen: Normal in size without focal abnormality. Adrenals/Urinary Tract: Adrenal glands are unremarkable. Kidneys are normal, without renal calculi, focal lesion, or hydronephrosis. Bladder is unremarkable. Stomach/Bowel: Stomach is within normal limits. The appendix is not identified. No evidence of bowel wall thickening, distention, or inflammatory changes. Vascular/Lymphatic: No significant vascular findings are present. No enlarged abdominal or pelvic lymph nodes. Reproductive: Status post hysterectomy. No adnexal masses. Other: There is Tyrome Donatelli 1.9 cm x 2.9 cm fat containing umbilical hernia. Musculoskeletal: Multilevel degenerative changes seen throughout the lumbar spine. IMPRESSION: 1. No CT evidence of acute intrathoracic or intra-abdominal pathology. 2. 1.9 cm x 2.9 cm fat containing umbilical hernia. 3. Evidence of prior cholecystectomy. Electronically Signed   By: Virgina Norfolk M.D.   On: 04/06/2020 15:30   CT Abdomen Pelvis W Contrast  Result Date: 04/06/2020 CLINICAL DATA:  Right-sided shoulder pain and right sided chest pain. EXAM: CT CHEST, ABDOMEN, AND PELVIS WITH CONTRAST TECHNIQUE:  Multidetector CT imaging of the chest, abdomen and pelvis was performed following the standard protocol during bolus administration of intravenous contrast. CONTRAST:  128mL OMNIPAQUE IOHEXOL 300 MG/ML  SOLN COMPARISON:  None. FINDINGS: CT CHEST FINDINGS Cardiovascular: No significant vascular findings. Normal heart size. No pericardial effusion. Mediastinum/Nodes: No enlarged mediastinal, hilar, or axillary lymph nodes. Thyroid gland, trachea, and esophagus demonstrate no significant findings. Lungs/Pleura: Lungs are clear. No pleural effusion or pneumothorax. Musculoskeletal: Multilevel degenerative changes seen throughout the thoracic spine. CT ABDOMEN PELVIS FINDINGS Hepatobiliary: No focal liver abnormality is seen. Status post cholecystectomy. No biliary dilatation. Pancreas: Unremarkable. No pancreatic ductal dilatation or surrounding inflammatory changes. Spleen: Normal in size without focal abnormality. Adrenals/Urinary Tract: Adrenal glands are unremarkable. Kidneys are normal, without renal calculi, focal lesion, or hydronephrosis. Bladder is unremarkable. Stomach/Bowel: Stomach is within normal limits. The appendix is not identified. No evidence of bowel wall thickening, distention, or inflammatory changes. Vascular/Lymphatic: No significant vascular findings are present. No enlarged abdominal or pelvic lymph nodes. Reproductive: Status post hysterectomy. No adnexal masses. Other: There is Eston Heslin 1.9 cm x 2.9 cm fat containing umbilical hernia. Musculoskeletal: Multilevel degenerative changes seen throughout the lumbar spine. IMPRESSION: 1. No CT evidence of acute intrathoracic or intra-abdominal pathology. 2. 1.9 cm x 2.9 cm fat containing umbilical hernia. 3. Evidence of prior cholecystectomy. Electronically Signed   By: Virgina Norfolk M.D.   On: 04/06/2020 15:30   DG Chest Port 1 View  Result Date: 04/06/2020 CLINICAL DATA:  Shortness of breath. Additional provided: Patient reports pain starting in  right shoulder which radiates under right breast and into chest, pain with breathing. EXAM: PORTABLE CHEST 1 VIEW COMPARISON:  Prior chest radiographs 05/13/2019 and earlier FINDINGS: Heart size within normal limits. There is no appreciable airspace consolidation. No frank pulmonary edema. No evidence of pleural effusion or pneumothorax. No acute bony abnormality identified. IMPRESSION: No evidence of acute cardiopulmonary abnormality. Electronically Signed   By: Kellie Simmering DO   On: 04/06/2020 13:20        Scheduled Meds: . amLODipine  5 mg Oral Daily  .  cloNIDine  0.2 mg Oral BID  . enoxaparin (LOVENOX) injection  40 mg Subcutaneous Q24H  . feeding supplement (ENSURE ENLIVE)  237 mL Oral BID BM  . insulin aspart  0-9 Units Subcutaneous TID WC  . insulin aspart protamine- aspart  22 Units Subcutaneous BID WC   Continuous Infusions: . piperacillin-tazobactam (ZOSYN)  IV 3.375 g (04/07/20 1436)  . vancomycin 200 mL/hr at 04/07/20 1411     LOS: 0 days    Time spent: over 30 min    Fayrene Helper, MD [facility]   To contact the attending provider between 7A-7P or the covering provider during after hours 7P-7A, please log into the web site [email or web address] and access using universal  password for that web site. If you do not have the password, please call the hospital operator.  04/07/2020, 8:17 PM

## 2020-04-07 NOTE — ED Notes (Addendum)
Pt c/o itching all over. No rash visible. Zozyn stopped. Dr. Florina Ou made aware.

## 2020-04-07 NOTE — Progress Notes (Signed)
Nutrition Brief Note  Patient identified on the Malnutrition Screening Tool (MST) Report  Patient with no significant weight loss. Pt currently consuming 100% of meals (breakfast provided ~500 kcals and 27g protein).  Wt Readings from Last 15 Encounters:  04/06/20 89.8 kg  03/03/20 91.4 kg  03/03/20 91.6 kg  12/04/19 90 kg  05/13/19 95 kg  03/26/19 95 kg  09/25/18 94.3 kg  08/23/18 95.3 kg  08/02/18 94.9 kg  01/29/18 91.5 kg  10/31/17 91.6 kg  10/05/17 89.8 kg  08/24/17 89.8 kg  08/20/17 85.7 kg  05/02/17 92.5 kg    Body mass index is 36.21 kg/m. Patient meets criteria for obesity based on current BMI.   Current diet order is Heart Healthy/CHO modified, patient is consuming approximately 100% of meals at this time. Labs and medications reviewed.   No nutrition interventions warranted at this time. If nutrition issues arise, please consult RD.   Clayton Bibles, MS, RD, LDN Inpatient Clinical Dietitian Contact information available via Amion

## 2020-04-07 NOTE — Progress Notes (Signed)
This RN contacted admitting , made aware pt is here from [facility]. Advised Dr. Raliegh Ip will be on unit shortly

## 2020-04-07 NOTE — H&P (Signed)
History and Physical    Heather Walker PTW:[id number] DOB: 1961-05-21 DOA: 04/06/2020  PCP: [name], DO  Patient coming from: Home.  Chief Complaint: Right flank pain.  Chills.  HPI: Heather Walker is a 59 y.o. female with history of diabetes mellitus type 2, hypertension who was admitted in 2018 for MSSA bacteremia of unclear source at that time patient was admitted with right hip pain.  Patient started having chills and rigors yesterday morning with right flank pain.  Denies any nausea vomiting diarrhea chest pain or shortness of breath.  ED Course: In the ER patient was afebrile but tachycardic with elevated lactic acid levels.  Patient was given fluid bolus for possible developing sepsis patient's labs also showed hypokalemia of 2.7 hypomagnesemia 1.5 which were replaced.  Patient had CT angiogram of the chest abdomen pelvis which does not show anything acute.  Patient was empirically surrounded by admitted for further observation for possible developing sepsis given the previous history of MSSA bacteremia.  Patient's pain is controlled with pain relief medication at this time.  On exam patient does not have any rash on the abdomen or any spinal tenderness.  Covid testing negative UA unremarkable.  Review of Systems: As per HPI, rest all negative.   Past Medical History:  Diagnosis Date  . Arthritis   . Asthma   . Depression   . Diabetes mellitus without complication ([place])   . Heart murmur   . Hypertension     Past Surgical History:  Procedure Laterality Date  . ABDOMINAL HYSTERECTOMY    . APPENDECTOMY    . BLADDER SUSPENSION    . CESAREAN SECTION     3 previous  . TEE WITHOUT CARDIOVERSION N/A 08/24/2017   Procedure: TRANSESOPHAGEAL ECHOCARDIOGRAM (TEE);  Surgeon: Larey Dresser, MD;  Location: [facility];  Service: Cardiovascular;  Laterality: N/A;  . TUBAL LIGATION       reports that she has never smoked. She has never used smokeless tobacco. She  reports that she does not drink alcohol and does not use drugs.  Allergies  Allergen Reactions  . Crestor [Rosuvastatin Calcium] Anaphylaxis  . Metformin And Related Diarrhea    Nausea and diarrhea  . Atorvastatin Other (See Comments)    Neck stiffness  . Codeine Itching    Family History  Problem Relation Age of Onset  . Hypertension Mother   . Asthma Mother   . Hypertension Sister   . Asthma Sister   . Diabetes Sister   . Hypertension Maternal Grandmother   . Heart defect Sister   . Asthma Sister   . Aneurysm Sister   . Asthma Sister     Prior to Admission medications   Medication Sig Start Date End Date Taking? Authorizing Provider  amLODipine (NORVASC) 5 MG tablet Take 1 tablet (5 mg total) by mouth daily. 02/07/20   Roma Schanz R, DO  cloNIDine (CATAPRES) 0.2 MG tablet TAKE 1 TABLET(0.2 MG) BY MOUTH TWICE DAILY 10/21/19   Carollee Herter, Kendrick Fries R, DO  glucose blood (ONETOUCH VERIO) test strip 1 each by Other route 2 (two) times daily. Use as instructed 12/04/19   Shamleffer, Melanie Crazier, MD  insulin isophane & regular human (NOVOLIN 70/30 FLEXPEN) (70-30) 100 UNIT/ML KwikPen Inject 22 Units into the skin 2 (two) times daily with a meal. 03/03/20   Shamleffer, Melanie Crazier, MD  Insulin Pen Needle 32G X 4 MM MISC Inject 1 Device into the skin in the morning and at  bedtime. 2x daily 12/04/19   Shamleffer, Melanie Crazier, MD  Lancets [facility] ULTRASOFT) lancets Use as instructed to test blood sugar 2 times daily E11.65 12/11/19   Shamleffer, Melanie Crazier, MD  NOVOLOG MIX 70/30 FLEXPEN (70-30) 100 UNIT/ML FlexPen  02/17/20   [provider]  OneTouch Delica Lancets 54Y MISC 1 Package by Does not apply route in the morning and at bedtime. Use as instructed 2 times daily E11.65 02/20/20   Shamleffer, Melanie Crazier, MD  Semaglutide,0.25 or 0.5MG /DOS, (OZEMPIC, 0.25 OR 0.5 MG/DOSE,) 2 MG/1.5ML SOPN Inject 0.5 mg into the skin once a week. 03/03/20   Shamleffer, Melanie Crazier, MD  traZODone (DESYREL) 50 MG tablet TAKE 1/2 TO 1 TABLET(25 TO 50 MG) BY MOUTH AT BEDTIME AS NEEDED FOR SLEEP 02/11/20   Carollee Herter, Alferd Apa, DO  triamcinolone cream (KENALOG) 0.1 % Apply 1 application topically 2 (two) times daily. 03/03/20   [name], DO  triamterene-hydrochlorothiazide (MAXZIDE) 75-50 MG tablet Take 1 tablet by mouth daily. 02/14/20   [name], DO    Physical Exam: Constitutional: Moderately built and nourished. Vitals:   04/07/20 0200 04/07/20 0308 04/07/20 0419 04/07/20 0542  BP:  (!) 151/99 (!) 172/102 (!) 152/99  Pulse:  79 85 (!) 104  Resp: 18 18 20 19   Temp:  98.4 F (36.9 C) 98.4 F (36.9 C) 98 F (36.7 C)  TempSrc:  Oral Oral Oral  SpO2: 95% 96% 95% 97%  Weight:      Height:       Eyes: Anicteric no pallor. ENMT: No discharge from the ears eyes nose or mouth. Neck: No mass felt.  No neck rigidity. Respiratory: No rhonchi or crepitations. Cardiovascular: S1-S2 heard. Abdomen: Soft nontender bowel sounds present. Musculoskeletal: No edema. Skin: No rash. Neurologic: Alert awake oriented time place and person.  Moves all extremities. Psychiatric: Appears normal.  Normal affect.   Labs on Admission: I have personally reviewed following labs and imaging studies  CBC: Recent Labs  Lab 04/06/20 1223  WBC 5.4  NEUTROABS 3.2  HGB 12.8  HCT 39.0  MCV 83.7  PLT 706   Basic Metabolic Panel: Recent Labs  Lab 04/06/20 1216  NA 140  K 2.7*  CL 101  CO2 26  GLUCOSE 162*  BUN 13  CREATININE 0.93  CALCIUM 9.0  MG 1.5*   GFR: Estimated Creatinine Clearance: 69.5 mL/min (by C-G formula based on SCr of 0.93 mg/dL). Liver Function Tests: Recent Labs  Lab 04/06/20 1216  AST 21  ALT 13  ALKPHOS 89  BILITOT 0.5  PROT 8.1  ALBUMIN 4.0   No results for input(s): LIPASE, AMYLASE in the last 168 hours. No results for input(s): AMMONIA in the last 168 hours. Coagulation Profile: Recent Labs  Lab  04/06/20 1408  INR 1.0   Cardiac Enzymes: No results for input(s): CKTOTAL, CKMB, CKMBINDEX, TROPONINI in the last 168 hours. BNP (last 3 results) No results for input(s): PROBNP in the last 8760 hours. HbA1C: No results for input(s): HGBA1C in the last 72 hours. CBG: No results for input(s): GLUCAP in the last 168 hours. Lipid Profile: No results for input(s): CHOL, HDL, LDLCALC, TRIG, CHOLHDL, LDLDIRECT in the last 72 hours. Thyroid Function Tests: No results for input(s): TSH, T4TOTAL, FREET4, T3FREE, THYROIDAB in the last 72 hours. Anemia Panel: No results for input(s): VITAMINB12, FOLATE, FERRITIN, TIBC, IRON, RETICCTPCT in the last 72 hours. Urine analysis:    Component Value Date/Time   COLORURINE YELLOW  04/06/2020 [place] 04/06/2020 1355   LABSPEC 1.015 04/06/2020 1355   PHURINE 7.0 04/06/2020 1355   GLUCOSEU NEGATIVE 04/06/2020 1355   HGBUR NEGATIVE 04/06/2020 1355   HGBUR negative 09/03/2007 1610   BILIRUBINUR NEGATIVE 04/06/2020 1355   BILIRUBINUR neg 05/09/2016 1324   [place] 04/06/2020 1355   PROTEINUR NEGATIVE 04/06/2020 1355   UROBILINOGEN 0.2 05/09/2016 1324   UROBILINOGEN 1.0 02/09/2014 1257   NITRITE NEGATIVE 04/06/2020 1355   LEUKOCYTESUR NEGATIVE 04/06/2020 1355   Sepsis Labs: @LABRCNTIP (procalcitonin:4,lacticidven:4) ) Recent Results (from the past 240 hour(s))  SARS Coronavirus 2 by RT PCR (hospital order, performed in [place] hospital lab) Nasopharyngeal Nasopharyngeal Swab     Status: None   Collection Time: 04/06/20  2:08 PM   Specimen: Nasopharyngeal Swab  Result Value Ref Range Status   SARS Coronavirus 2 NEGATIVE NEGATIVE Final    Comment: (NOTE) SARS-CoV-2 target nucleic acids are NOT DETECTED.  The SARS-CoV-2 RNA is generally detectable in upper and lower respiratory specimens during the acute phase of infection. The lowest concentration of SARS-CoV-2 viral copies this assay can detect is 250 copies /  mL. A negative result does not preclude SARS-CoV-2 infection and should not be used as the sole basis for treatment or other patient management decisions.  A negative result may occur with improper specimen collection / handling, submission of specimen other than nasopharyngeal swab, presence of viral mutation(s) within the areas targeted by this assay, and inadequate number of viral copies (<250 copies / mL). A negative result must be combined with clinical observations, patient history, and epidemiological information.  Fact Sheet for Patients:   StrictlyIdeas.no  Fact Sheet for Healthcare Providers: BankingDealers.co.za  This test is not yet approved or  cleared by the [place] FDA and has been authorized for detection and/or diagnosis of SARS-CoV-2 by FDA under an Emergency Use Authorization (EUA).  This EUA will remain in effect (meaning this test can be used) for the duration of the COVID-19 declaration under Section 564(b)(1) of the Act, 21 U.S.C. section 360bbb-3(b)(1), unless the authorization is terminated or revoked sooner.  Performed at [facility], [address] [place], [place] [address]      Radiological Exams on Admission: CT Chest W Contrast  Result Date: 04/06/2020 CLINICAL DATA:  Right-sided shoulder pain and chest pain. EXAM: CT CHEST, ABDOMEN, AND PELVIS WITH CONTRAST TECHNIQUE: Multidetector CT imaging of the chest, abdomen and pelvis was performed following the standard protocol during bolus administration of intravenous contrast. CONTRAST:  146mL OMNIPAQUE IOHEXOL 300 MG/ML  SOLN COMPARISON:  May 02, 2013 FINDINGS: CT CHEST FINDINGS Cardiovascular: No significant vascular findings. Normal heart size. No pericardial effusion. Mediastinum/Nodes: No enlarged mediastinal, hilar, or axillary lymph nodes. Thyroid gland, trachea, and esophagus demonstrate no significant findings. Lungs/Pleura: Lungs are  clear. No pleural effusion or pneumothorax. Musculoskeletal: Multilevel degenerative changes seen throughout the thoracic spine. CT ABDOMEN PELVIS FINDINGS Hepatobiliary: No focal liver abnormality is seen. Status post cholecystectomy. No biliary dilatation. Pancreas: Unremarkable. No pancreatic ductal dilatation or surrounding inflammatory changes. Spleen: Normal in size without focal abnormality. Adrenals/Urinary Tract: Adrenal glands are unremarkable. Kidneys are normal, without renal calculi, focal lesion, or hydronephrosis. Bladder is unremarkable. Stomach/Bowel: Stomach is within normal limits. The appendix is not identified. No evidence of bowel wall thickening, distention, or inflammatory changes. Vascular/Lymphatic: No significant vascular findings are present. No enlarged abdominal or pelvic lymph nodes. Reproductive: Status post hysterectomy. No adnexal masses. Other: There is a 1.9 cm  x 2.9 cm fat containing umbilical hernia. Musculoskeletal: Multilevel degenerative changes seen throughout the lumbar spine. IMPRESSION: 1. No CT evidence of acute intrathoracic or intra-abdominal pathology. 2. 1.9 cm x 2.9 cm fat containing umbilical hernia. 3. Evidence of prior cholecystectomy. Electronically Signed   By: Virgina Norfolk M.D.   On: 04/06/2020 15:30   CT Abdomen Pelvis W Contrast  Result Date: 04/06/2020 CLINICAL DATA:  Right-sided shoulder pain and right sided chest pain. EXAM: CT CHEST, ABDOMEN, AND PELVIS WITH CONTRAST TECHNIQUE: Multidetector CT imaging of the chest, abdomen and pelvis was performed following the standard protocol during bolus administration of intravenous contrast. CONTRAST:  124mL OMNIPAQUE IOHEXOL 300 MG/ML  SOLN COMPARISON:  None. FINDINGS: CT CHEST FINDINGS Cardiovascular: No significant vascular findings. Normal heart size. No pericardial effusion. Mediastinum/Nodes: No enlarged mediastinal, hilar, or axillary lymph nodes. Thyroid gland, trachea, and esophagus demonstrate  no significant findings. Lungs/Pleura: Lungs are clear. No pleural effusion or pneumothorax. Musculoskeletal: Multilevel degenerative changes seen throughout the thoracic spine. CT ABDOMEN PELVIS FINDINGS Hepatobiliary: No focal liver abnormality is seen. Status post cholecystectomy. No biliary dilatation. Pancreas: Unremarkable. No pancreatic ductal dilatation or surrounding inflammatory changes. Spleen: Normal in size without focal abnormality. Adrenals/Urinary Tract: Adrenal glands are unremarkable. Kidneys are normal, without renal calculi, focal lesion, or hydronephrosis. Bladder is unremarkable. Stomach/Bowel: Stomach is within normal limits. The appendix is not identified. No evidence of bowel wall thickening, distention, or inflammatory changes. Vascular/Lymphatic: No significant vascular findings are present. No enlarged abdominal or pelvic lymph nodes. Reproductive: Status post hysterectomy. No adnexal masses. Other: There is a 1.9 cm x 2.9 cm fat containing umbilical hernia. Musculoskeletal: Multilevel degenerative changes seen throughout the lumbar spine. IMPRESSION: 1. No CT evidence of acute intrathoracic or intra-abdominal pathology. 2. 1.9 cm x 2.9 cm fat containing umbilical hernia. 3. Evidence of prior cholecystectomy. Electronically Signed   By: Virgina Norfolk M.D.   On: 04/06/2020 15:30   DG Chest Port 1 View  Result Date: 04/06/2020 CLINICAL DATA:  Shortness of breath. Additional provided: Patient reports pain starting in right shoulder which radiates under right breast and into chest, pain with breathing. EXAM: PORTABLE CHEST 1 VIEW COMPARISON:  Prior chest radiographs 05/13/2019 and earlier FINDINGS: Heart size within normal limits. There is no appreciable airspace consolidation. No frank pulmonary edema. No evidence of pleural effusion or pneumothorax. No acute bony abnormality identified. IMPRESSION: No evidence of acute cardiopulmonary abnormality. Electronically Signed   By: Kellie Simmering DO   On: 04/06/2020 13:20    EKG: Independently reviewed.  Normal sinus rhythm.  Assessment/Plan Principal Problem:   SIRS (systemic inflammatory response syndrome) (HCC) Active Problems:   Essential hypertension   Asthma   Hypokalemia   DM (diabetes mellitus) type II uncontrolled, periph vascular disorder (HCC)   Asthma, chronic, unspecified asthma severity, uncomplicated    1. SIRS source not clear.  Patient's pain is mostly localized in the right flank area with CAT scan showing nothing acute.  I do not see any obvious rash on the right flank at this time.  Empiric antibiotics have been started after blood cultures were obtained.  Patient has had previous history of MSSA bacteremia with similar presentation in 2018.  Closely monitor. 2. Hypokalemia and hypomagnesemia could be from diuretic use which I am holding off now.  Potassium and magnesium has been replaced in the ER.  We will repeat labs. 3. Diabetes mellitus type 2 on insulin. 4. Hypertension on clonidine and amlodipine. 5. History of asthma not acutely  wheezing.  Since patient had a septic-like picture on presentation patient will need close monitoring for any further deterioration in inpatient status.   DVT prophylaxis: Lovenox. Code Status: Full code. Family Communication: Discussed with patient. Disposition Plan: Home. Consults called: None. Admission status: Inpatient.   Rise Patience MD [facility] Pager [phone].  If 7PM-7AM, please contact night-coverage [email or web address] Password [facility]  04/07/2020, 6:04 AM

## 2020-04-07 NOTE — Chronic Care Management (AMB) (Deleted)
Chronic Care Management Pharmacy  Name: Heather Walker  MRN: [id number] DOB: [birth year]  Chief Complaint/ HPI  Heather Walker,  59 y.o. , female presents for their Follow-Up CCM visit with the clinical pharmacist via telephone due to COVID-19 Pandemic.  PCP : Heather Held, DO  Their chronic conditions include: Diabetes, Hypertension, Asthma, Depression  Office Visits:  10/21/2019 - ([place]) - labs ordered, no med changes notes  09/03/2019 - ([place]) - Home BP elevated (170/145, 180/123), no med changes  Consult Visit:  12/04/19 - [facility]) Endocrinology - stopped glipizide, started Novolog mix, does not check her sugars and not taking insulin for 5-6 months, provided a new meter, switched from Novolog mix to Novolin mix, told to check BG bid, f/u in 3 months  ED Visit 04/06/20: [facility]. Previous 2018 Admission for MSSA bacteremia of unclear source. Pt afebrile but tachycardic w/ elevated lactic acid, hypokalemia, and hypomagnesemia which were replaced. All other labs unremarkable  Medications: Facility-Administered Encounter Medications as of 04/07/2020  Medication  . amLODipine (NORVASC) tablet 5 mg  . cloNIDine (CATAPRES) tablet 0.2 mg  . enoxaparin (LOVENOX) injection 40 mg  . feeding supplement (ENSURE ENLIVE) (ENSURE ENLIVE) liquid 237 mL  . insulin aspart (novoLOG) injection 0-9 Units  . insulin isophane & regular human (HUMULIN 70/30 MIX) (70-30) 100 UNIT/ML KwikPen 22 Units  . morphine 2 MG/ML injection 1 mg  . ondansetron (ZOFRAN) tablet 4 mg   Or  . ondansetron (ZOFRAN) injection 4 mg  . piperacillin-tazobactam (ZOSYN) IVPB 3.375 g  . traZODone (DESYREL) tablet 50 mg  . vancomycin (VANCOCIN) IVPB 1000 mg/200 mL premix   Outpatient Encounter Medications as of 04/07/2020  Medication Sig  . amLODipine (NORVASC) 5 MG tablet Take 1 tablet (5 mg total) by mouth daily.  . cloNIDine (CATAPRES) 0.2 MG tablet TAKE 1 TABLET(0.2 MG)  BY MOUTH TWICE DAILY  . glucose blood (ONETOUCH VERIO) test strip 1 each by Other route 2 (two) times daily. Use as instructed  . insulin isophane & regular human (NOVOLIN 70/30 FLEXPEN) (70-30) 100 UNIT/ML KwikPen Inject 22 Units into the skin 2 (two) times daily with a meal.  . Insulin Pen Needle 32G X 4 MM MISC Inject 1 Device into the skin in the morning and at bedtime. 2x daily  . Lancets (ONETOUCH ULTRASOFT) lancets Use as instructed to test blood sugar 2 times daily E11.65  . NOVOLOG MIX 70/30 FLEXPEN (70-30) 100 UNIT/ML FlexPen   . OneTouch Delica Lancets 64P MISC 1 Package by Does not apply route in the morning and at bedtime. Use as instructed 2 times daily E11.65  . Semaglutide,0.25 or 0.5MG /DOS, (OZEMPIC, 0.25 OR 0.5 MG/DOSE,) 2 MG/1.5ML SOPN Inject 0.5 mg into the skin once a week.  . traZODone (DESYREL) 50 MG tablet TAKE 1/2 TO 1 TABLET(25 TO 50 MG) BY MOUTH AT BEDTIME AS NEEDED FOR SLEEP  . triamcinolone cream (KENALOG) 0.1 % Apply 1 application topically 2 (two) times daily.  Marland Kitchen triamterene-hydrochlorothiazide (MAXZIDE) 75-50 MG tablet Take 1 tablet by mouth daily.     Current Diagnosis/Assessment:  Goals Addressed   None    Social Hx:  Originally from [place].  Has 4 adult children who are local.  Married for 22 years. Patient's husband had bypass surgery in January and that has her very busy.   Diabetes   Recent Relevant Labs: Lab Results  Component Value Date/Time   HGBA1C 8.7 (A) 03/03/2020 08:46 AM   HGBA1C  12.0 (A) 12/04/2019 02:41 PM   HGBA1C 11.5 (H) 02/20/2019 01:16 PM   HGBA1C 9.8 (H) 08/02/2018 03:25 PM   MICROALBUR <0.7 12/04/2019 03:07 PM   MICROALBUR 1.3 11/10/2016 01:07 PM    A1c goal <7% FBG: 80 - 130 PPBG: <180  Checking BG: Daily  Recent FBG Readings: 211  210  265 due to food eaten 198  166  165  94 limited what she ate night before 160  143  184 Average  179.6   Patient has failed these meds in past: Metformin (GI),  Jardiance (cost), Farxiga (neck pain) Patient is currently uncontrolled, but BG seems improved from a1c reading in February on the following medications: Novolin 70/30 25 units bid  Reports she is very adherent to taking Novolin 70/30  Last diabetic Eye exam:  Lab Results  Component Value Date/Time   HMDIABEYEEXA No Retinopathy 04/15/2019 12:00 AM    Last diabetic Foot exam: No results found for: HMDIABFOOTEX   Wakes up in the middle of the night and eats something sweet  Diet: Stays away from starchy foods. No rice. Likes bread, especially corn bread and biscuits. Tries to stay B: Ham and egg biscuit from biscuit, grape soda L: Usually skips D: Greens with hammock, corn bread Drinks: Water  Exercise: Minimal currently  We discussed: Thoroughly diet by reviewing nutrition labels and noting that she should limit her carbohydrate intake to 30-45 grams per meal  Plan -Limit carbohydrates to 30-45 grams of carbs per meal -Walk for 45 minutes 5 days a week -Continue current medications   Hypertension   CMP Latest Ref Rng & Units 04/07/2020 04/06/2020 05/14/2019  Glucose 70 - 99 mg/dL 116(H) 162(H) 245(H)  BUN 6 - 20 mg/dL 8 13 9   Creatinine 0.44 - 1.00 mg/dL 0.89 0.93 1.01(H)  Sodium 135 - 145 mmol/L 140 140 140  Potassium 3.5 - 5.1 mmol/L 2.9(L) 2.7(LL) 3.5  Chloride 98 - 111 mmol/L 98 101 102  CO2 22 - 32 mmol/L 31 26 29   Calcium 8.9 - 10.3 mg/dL 8.6(L) 9.0 7.7(L)  Total Protein 6.5 - 8.1 g/dL 7.4 8.1 7.1  Total Bilirubin 0.3 - 1.2 mg/dL 0.8 0.5 0.5  Alkaline Phos 38 - 126 U/L 84 89 62  AST 15 - 41 U/L 17 21 30   ALT 0 - 44 U/L 14 13 25   GFR      >60   >60   49.45  BP today is:  Unable to assess due to phone visit  Office blood pressures are  BP Readings from Last 3 Encounters:  04/07/20 (!) 152/99  03/03/20 120/80  03/03/20 120/80   Blood pressure goal <140/90  Patient has failed these meds in the past: lisinopril 10mg , losartan 100mg , metoprolol tartrate 25mg   bid, metoprolol succinate 25mg  daily  Patient is currently controlled on the following medications: amlodipine 5 mg daily, clonidine 0.2mg  twice daily, triamterene-hctz 75-50mg  once daily  Patient states she took her last dose of amlodipine today and needs a refill per her pharmacy.  Patient checks BP at home infrequently (machine broke, feels like she can't afford to purchase another one)  Patient home BP readings are ranging: N/A  We discussed Proper blood pressure measurement technique   Plan -Request refills for amlodipine be sent into patient's pharmacy as she took her last dose today -See if insurance will cover BP cuff at pharmacy -Continue current medications      Asthma / Tobacco   Last spirometry score: None noted  Eosinophil count:  Lab Results  Component Value Date/Time   EOSPCT 1 04/07/2020 06:23 AM  %                               Eos (Absolute):  Lab Results  Component Value Date/Time   EOSABS 0.0 04/07/2020 06:23 AM    Tobacco Status:  Social History   Tobacco Use  Smoking Status Never Smoker  Smokeless Tobacco Never Used    Patient has failed these meds in past: albuterol inh??, Qvar Patient is currently controlled on the following medications: None noted  Experiencing wheezing when the season changes Reports she does have an inhaler on hand (2 years or more old) Last used albuterol about 2 weeks ago. She woke up in the middle of the night and could not breathe. Does not have to use often   Using maintenance inhaler regularly? No (does not have one prescribed) Frequency of rescue inhaler use:  several times per month  Plan -Consider getting new albuterol inhaler pending Dr. Etter Sjogren approval -Continue current medications  Depression    Patient has failed these meds in past: None noted  Patient is currently controlled on the following medications: None  States she is doing well and not struggling with depression.  Encouraged patient that  we are here to support her if needed.  Will administer PHQ9 at next visit  Plan -Continue current management  Hyperlipidemia   Lipid Panel     Component Value Date/Time   CHOL 173 02/20/2019 1053   TRIG 172.0 (H) 02/20/2019 1053   HDL 51.90 02/20/2019 1053   CHOLHDL 3 02/20/2019 1053   VLDL 34.4 02/20/2019 1053   LDLCALC 87 02/20/2019 1053   LDLDIRECT 115.0 08/02/2018 1525     The 10-year ASCVD risk score Mikey Bussing DC Jr., et al., 2013) is: 20%   Values used to calculate the score:     Age: 64 years     Sex: Female     Is Non-Hispanic African American: Yes     Diabetic: Yes     Tobacco smoker: No     Systolic Blood Pressure: 706 mmHg     Is BP treated: Yes     HDL Cholesterol: 51.9 mg/dL     Total Cholesterol: 173 mg/dL   Patient has failed these meds in past: crestor (anaphylaxis), atorvastatin (neck stiffneses) Patient is currently controlled except for TG on the following medications: none  We discussed:  diet and exercise extensively  Plan -Continue control with diet and exercise   Future Plan -If TG remains elevated consider adding fish oil   Vaccines   Reviewed and discussed patient's vaccination history.    Immunization History  Administered Date(s) Administered  . Influenza,inj,Quad PF,6+ Mos 07/25/2014, 10/05/2015, 10/05/2017  . PFIZER SARS-COV-2 Vaccination 01/09/2020, 02/03/2020  . Pneumococcal Polysaccharide-23 07/15/2014  . Td 04/12/2006  . Tdap 02/24/2011    Plan -Recommended patient receive shingles vaccine in pharmacy.   Miscellaneous Meds  Tramadol for pack and knee pain Trazadone 50mg  #1/2 to 1 hs   Patient is interested in [facility] and would like to consider switching pharmacies at next visit.

## 2020-04-07 NOTE — Progress Notes (Signed)
Pt arrived to the unit room 1532. Alert and oriented x4. Pain 6/10. Oriented to room and callbell with no complications. callbell within reach. Initial assessment completed. Pt guide at the bedside. Will continue to monitor.

## 2020-04-08 ENCOUNTER — Inpatient Hospital Stay (HOSPITAL_COMMUNITY): Payer: Medicare HMO

## 2020-04-08 DIAGNOSIS — E1165 Type 2 diabetes mellitus with hyperglycemia: Secondary | ICD-10-CM

## 2020-04-08 DIAGNOSIS — E1151 Type 2 diabetes mellitus with diabetic peripheral angiopathy without gangrene: Secondary | ICD-10-CM

## 2020-04-08 DIAGNOSIS — I10 Essential (primary) hypertension: Secondary | ICD-10-CM

## 2020-04-08 DIAGNOSIS — J45909 Unspecified asthma, uncomplicated: Secondary | ICD-10-CM

## 2020-04-08 LAB — COMPREHENSIVE METABOLIC PANEL
ALT: 13 U/L (ref 0–44)
AST: 15 U/L (ref 15–41)
Albumin: 3.5 g/dL (ref 3.5–5.0)
Alkaline Phosphatase: 78 U/L (ref 38–126)
Anion gap: 9 (ref 5–15)
BUN: 14 mg/dL (ref 6–20)
CO2: 30 mmol/L (ref 22–32)
Calcium: 8.7 mg/dL — ABNORMAL LOW (ref 8.9–10.3)
Chloride: 99 mmol/L (ref 98–111)
Creatinine, Ser: 1.16 mg/dL — ABNORMAL HIGH (ref 0.44–1.00)
GFR calc Af Amer: >60 mL/min (ref >60)
GFR calc non Af Amer: 52 mL/min — ABNORMAL LOW (ref >60)
Glucose, Bld: 146 mg/dL — ABNORMAL HIGH (ref 70–99)
Potassium: 3.2 mmol/L — ABNORMAL LOW (ref 3.5–5.1)
Sodium: 138 mmol/L (ref 135–145)
Total Bilirubin: 0.8 mg/dL (ref 0.3–1.2)
Total Protein: 7.2 g/dL (ref 6.5–8.1)

## 2020-04-08 LAB — GLUCOSE, CAPILLARY
Glucose-Capillary: 129 mg/dL — ABNORMAL HIGH (ref 70–99)
Glucose-Capillary: 133 mg/dL — ABNORMAL HIGH (ref 70–99)
Glucose-Capillary: 160 mg/dL — ABNORMAL HIGH (ref 70–99)
Glucose-Capillary: 149 mg/dL — ABNORMAL HIGH (ref 70–99)
Glucose-Capillary: 159 mg/dL — ABNORMAL HIGH (ref 70–99)

## 2020-04-08 LAB — PROCALCITONIN: Procalcitonin: <0.1 ng/mL

## 2020-04-08 LAB — PHOSPHORUS: Phosphorus: 2.7 mg/dL (ref 2.5–4.6)

## 2020-04-08 LAB — BRAIN NATRIURETIC PEPTIDE: B Natriuretic Peptide: 5.8 pg/mL (ref 0.0–100.0)

## 2020-04-08 LAB — MAGNESIUM: Magnesium: 2 mg/dL (ref 1.7–2.4)

## 2020-04-08 MED ORDER — SODIUM CHLORIDE 0.9 % IV SOLN: INTRAVENOUS | Status: DC

## 2020-04-08 MED ORDER — ALPRAZOLAM 1 MG PO TABS
1.0000 mg | ORAL_TABLET | Freq: Once | ORAL | Status: AC
  Administered 2020-04-08: 1 mg via ORAL
  Filled 2020-04-08: qty 1

## 2020-04-08 MED ORDER — POTASSIUM CHLORIDE CRYS ER 20 MEQ PO TBCR
40.0000 meq | EXTENDED_RELEASE_TABLET | Freq: Once | ORAL | Status: AC
  Administered 2020-04-08: 40 meq via ORAL
  Filled 2020-04-08: qty 2

## 2020-04-08 MED ORDER — SENNOSIDES-DOCUSATE SODIUM 8.6-50 MG PO TABS: 2.0000 | ORAL_TABLET | Freq: Every evening | ORAL | Status: DC | PRN

## 2020-04-08 MED ORDER — POLYETHYLENE GLYCOL 3350 17 G PO PACK
17.0000 g | PACK | Freq: Every day | ORAL | Status: DC | PRN
  Administered 2020-04-12: 17 g via ORAL
  Filled 2020-04-08: qty 1

## 2020-04-08 NOTE — Progress Notes (Signed)
PROGRESS NOTE    Heather Walker  GUR:[id number] DOB: Apr 15, 1961 DOA: 04/06/2020 PCP: [name], DO   Brief Narrative: 59 y.o.femalewithhistory of diabetes mellitus type 2, hypertension who was admitted in 2018 for MSSA bacteremia of unclear source at that time patient was admitted with right hip pain. Patient started having chills and rigors yesterday morning with right shoulder pain/upper flank.  When pain is resolved but her right shoulder pain persisted.  CT chest abdomen pelvis was also negative    Assessment & Plan:   Principal Problem:   SIRS (systemic inflammatory response syndrome) (HCC) Active Problems:   Essential hypertension   Asthma   Hypokalemia   DM (diabetes mellitus) type II uncontrolled, periph vascular disorder (HCC)   Asthma, chronic, unspecified asthma severity, uncomplicated  SIRS, unclear etiology -Previous history of MSSA bacteremia.  Negative examination.  Discontinue antibiotics vancomycin and Zosyn.  Supportive care when clinically monitored.  Right shoulder pain -MRI of the right shoulder ordered.  Some evidence of frozen shoulder/adhesive capsulitis.  Will consult orthopedic to see if she would benefit from steroid injection  Diabetes mellitus type 2, insulin-dependent -Sliding scale and Accu-Chek  Essential hypertension -Clonidine and amlodipine  Asthma -Are actively wheezing  DVT prophylaxis: Lovenox Code Status: Full code Family Communication: None at bedside  Status is: Inpatient  Remains inpatient appropriate because:Inpatient level of care appropriate due to severity of illness   Dispo: The patient is from: Home              Anticipated d/c is to: Home              Anticipated d/c date is: 1 day              Patient currently is not medically stable to d/c.  Still having right shoulder pain, monitoring blood cultures.  If remains stable no plans to discharge her tomorrow. PT/OT eval.    Subjective: Patient remains  afebrile with reporting of significant amount of sharp right shoulder pain.  Review of Systems Otherwise negative except as per HPI, including: General: Denies fever, chills, night sweats or unintended weight loss. Resp: Denies cough, wheezing, shortness of breath. Cardiac: Denies chest pain, palpitations, orthopnea, paroxysmal nocturnal dyspnea. GI: Denies abdominal pain, nausea, vomiting, diarrhea or constipation GU: Denies dysuria, frequency, hesitancy or incontinence MS: Denies muscle aches, joint pain or swelling Neuro: Denies headache, neurologic deficits (focal weakness, numbness, tingling), abnormal gait Psych: Denies anxiety, depression, SI/HI/AVH Skin: Denies new rashes or lesions ID: Denies sick contacts, exotic exposures, travel  Examination:  General exam: Appears calm and comfortable  Respiratory system: Clear to auscultation. Respiratory effort normal. Cardiovascular system: S1 & S2 heard, RRR. No JVD, murmurs, rubs, gallops or clicks. No pedal edema. Gastrointestinal system: Abdomen is nondistended, soft and nontender. No organomegaly or masses felt. Normal bowel sounds heard. Central nervous system: Alert and oriented. No focal neurological deficits. Extremities: Limited range of motion of her right shoulder above her head.  Respiratory discomfort wheezing.  No issues with the left shoulder. Skin: No rashes, lesions or ulcers Psychiatry: Judgement and insight appear normal. Mood & affect appropriate.     Objective: Vitals:   04/07/20 1347 04/07/20 1748 04/07/20 1950 04/08/20 0612  BP: (!) 153/98 128/89 (!) 129/93 118/80  Pulse: 92 91 90 76  Resp: 18 16 18 19   Temp: 98.4 F (36.9 C) 98.7 F (37.1 C) 98.5 F (36.9 C) 98.4 F (36.9 C)  TempSrc: Oral Oral    SpO2: 97%  98% 95% 94%  Weight:      Height:        Intake/Output Summary (Last 24 hours) at 04/08/2020 1526 Last data filed at 04/07/2020 1845 Gross per 24 hour  Intake 480 ml  Output --  Net 480 ml    Filed Weights   04/06/20 1202  Weight: 89.8 kg     Data Reviewed:   CBC: Recent Labs  Lab 04/06/20 1223 04/07/20 0623  WBC 5.4 5.5  NEUTROABS 3.2 3.6  HGB 12.8 12.3  HCT 39.0 38.0  MCV 83.7 84.1  PLT 247 789   Basic Metabolic Panel: Recent Labs  Lab 04/06/20 1216 04/07/20 0623 04/08/20 0418  NA 140 140 138  K 2.7* 2.9* 3.2*  CL 101 98 99  CO2 26 31 30   GLUCOSE 162* 116* 146*  BUN 13 8 14   CREATININE 0.93 0.89 1.16*  CALCIUM 9.0 8.6* 8.7*  MG 1.5* 1.7 2.0  PHOS  --   --  2.7   GFR: Estimated Creatinine Clearance: 55.8 mL/min (A) (by C-G formula based on SCr of 1.16 mg/dL (H)). Liver Function Tests: Recent Labs  Lab 04/06/20 1216 04/07/20 0623 04/08/20 0418  AST 21 17 15   ALT 13 14 13   ALKPHOS 89 84 78  BILITOT 0.5 0.8 0.8  PROT 8.1 7.4 7.2  ALBUMIN 4.0 3.7 3.5   No results for input(s): LIPASE, AMYLASE in the last 168 hours. No results for input(s): AMMONIA in the last 168 hours. Coagulation Profile: Recent Labs  Lab 04/06/20 1408  INR 1.0   Cardiac Enzymes: No results for input(s): CKTOTAL, CKMB, CKMBINDEX, TROPONINI in the last 168 hours. BNP (last 3 results) No results for input(s): PROBNP in the last 8760 hours. HbA1C: No results for input(s): HGBA1C in the last 72 hours. CBG: Recent Labs  Lab 04/07/20 1658 04/07/20 1957 04/08/20 0749 04/08/20 1136 04/08/20 1221  GLUCAP 111* 174* 129* 160* 133*   Lipid Profile: No results for input(s): CHOL, HDL, LDLCALC, TRIG, CHOLHDL, LDLDIRECT in the last 72 hours. Thyroid Function Tests: No results for input(s): TSH, T4TOTAL, FREET4, T3FREE, THYROIDAB in the last 72 hours. Anemia Panel: No results for input(s): VITAMINB12, FOLATE, FERRITIN, TIBC, IRON, RETICCTPCT in the last 72 hours. Sepsis Labs: Recent Labs  Lab 04/06/20 1246 04/06/20 1554 04/07/20 0623 04/08/20 0456  PROCALCITON  --   --   --  <0.10  LATICACIDVEN 3.0* 1.4 1.2  --     Recent Results (from the past 240 hour(s))   Culture, blood (single)     Status: None (Preliminary result)   Collection Time: 04/06/20 12:23 PM   Specimen: BLOOD  Result Value Ref Range Status   Specimen Description   Final    BLOOD RIGHT ANTECUBITAL Performed at [facility], [place] [address], [place], [place] [address]    Special Requests   Final    BOTTLES DRAWN AEROBIC AND ANAEROBIC Blood Culture adequate volume Performed at [facility], [address] [place], [place] [address]    Culture   Final    NO GROWTH 2 DAYS Performed at [facility], [place] [address] [place], [place] [address]    Report Status PENDING  Incomplete  Culture, blood (single)     Status: None (Preliminary result)   Collection Time: 04/06/20  1:55 PM   Specimen: BLOOD  Result Value Ref Range Status   Specimen Description   Final    BLOOD LEFT ANTECUBITAL Performed at [facility]  Lab, [address], [place], [place] [address]    Special Requests   Final    BOTTLES DRAWN AEROBIC AND ANAEROBIC Blood Culture results may not be optimal due to an inadequate volume of blood received in culture bottles Performed at [facility], [address] [place], [place] [address]    Culture   Final    NO GROWTH 2 DAYS Performed at [facility], [place] [address] [place], [place] [address]    Report Status PENDING  Incomplete  Urine culture     Status: Abnormal   Collection Time: 04/06/20  1:55 PM   Specimen: In/Out Cath Urine  Result Value Ref Range Status   Specimen Description   Final    IN/OUT CATH URINE Performed at [facility], [address] [place], [place] [address]    Special Requests   Final    NONE Performed at [facility], [address] [place], [place] [address]    Culture (A)  Final    <10,000 COLONIES/mL MULTIPLE SPECIES PRESENT, SUGGEST RECOLLECTION   Report Status 04/07/2020 FINAL  Final  SARS Coronavirus 2 by RT PCR (hospital order, performed in [facility] hospital  lab) Nasopharyngeal Nasopharyngeal Swab     Status: None   Collection Time: 04/06/20  2:08 PM   Specimen: Nasopharyngeal Swab  Result Value Ref Range Status   SARS Coronavirus 2 NEGATIVE NEGATIVE Final    Comment: (NOTE) SARS-CoV-2 target nucleic acids are NOT DETECTED.  The SARS-CoV-2 RNA is generally detectable in upper and lower respiratory specimens during the acute phase of infection. The lowest concentration of SARS-CoV-2 viral copies this assay can detect is 250 copies / mL. A negative result does not preclude SARS-CoV-2 infection and should not be used as the sole basis for treatment or other patient management decisions.  A negative result may occur with improper specimen collection / handling, submission of specimen other than nasopharyngeal swab, presence of viral mutation(s) within the areas targeted by this assay, and inadequate number of viral copies (<250 copies / mL). A negative result must be combined with clinical observations, patient history, and epidemiological information.  Fact Sheet for Patients:   StrictlyIdeas.no  Fact Sheet for Healthcare Providers: BankingDealers.co.za  This test is not yet approved or  cleared by the [place] FDA and has been authorized for detection and/or diagnosis of SARS-CoV-2 by FDA under an Emergency Use Authorization (EUA).  This EUA will remain in effect (meaning this test can be used) for the duration of the COVID-19 declaration under Section 564(b)(1) of the Act, 21 U.S.C. section 360bbb-3(b)(1), unless the authorization is terminated or revoked sooner.  Performed at [facility], [address] [place], [place] [address]          Radiology Studies: MR SHOULDER RIGHT WO CONTRAST  Result Date: 04/08/2020 CLINICAL DATA:  Right shoulder and right scapular pain EXAM: MRI OF THE RIGHT SHOULDER WITHOUT CONTRAST TECHNIQUE: Multiplanar, multisequence MR imaging  of the shoulder was performed. No intravenous contrast was administered. COMPARISON:  X-ray 09/04/2014 FINDINGS: Rotator cuff: Mild infraspinatus tendinosis with tiny rim rent tear of the mid insertional fibers (series 11, image 9). Supraspinatus and subscapularis tendons are intact with mild tendinosis. Intact teres minor. Muscles: No atrophy or abnormal signal of the muscles of the rotator cuff. Biceps long head: Intact. Mild fluid within the biceps tendon sheath. Acromioclavicular Joint: Mild-moderate arthropathy of the acromioclavicular joint. Trace subacromial/subdeltoid bursal  edema without fluid collection. Glenohumeral Joint: No joint effusion. No chondral defect. Labrum: Grossly intact, but evaluation is limited by lack of intraarticular fluid. Bones:  No marrow abnormality, fracture or dislocation. Other: Infiltration of the anatomic fat within the rotator interval. No soft tissue edema or fluid collection. IMPRESSION: 1. No acute osseous abnormality of the right shoulder. 2. Mild rotator cuff tendinosis with tiny rim rent tear of the infraspinatus tendon. 3. Findings which can be seen in the clinical setting of adhesive capsulitis. 4. Mild-moderate arthropathy of the acromioclavicular joint. Electronically Signed   By: Davina Poke D.O.   On: 04/08/2020 14:54        Scheduled Meds: . amLODipine  5 mg Oral Daily  . cloNIDine  0.2 mg Oral BID  . enoxaparin (LOVENOX) injection  40 mg Subcutaneous Q24H  . feeding supplement (ENSURE ENLIVE)  237 mL Oral BID BM  . insulin aspart  0-9 Units Subcutaneous TID WC  . insulin aspart protamine- aspart  22 Units Subcutaneous BID WC   Continuous Infusions: . sodium chloride 75 mL/hr at 04/08/20 1230     LOS: 1 day   Time spent= 35 mins    Justin Buechner Arsenio Loader, MD [facility]  If 7PM-7AM, please contact night-coverage  04/08/2020, 3:26 PM

## 2020-04-09 ENCOUNTER — Inpatient Hospital Stay (HOSPITAL_COMMUNITY): Payer: Medicare HMO

## 2020-04-09 LAB — TSH: TSH: 1.548 u[IU]/mL (ref 0.350–4.500)

## 2020-04-09 LAB — BASIC METABOLIC PANEL
Anion gap: 7 (ref 5–15)
BUN: 15 mg/dL (ref 6–20)
CO2: 28 mmol/L (ref 22–32)
Calcium: 8 mg/dL — ABNORMAL LOW (ref 8.9–10.3)
Chloride: 105 mmol/L (ref 98–111)
Creatinine, Ser: 1.07 mg/dL — ABNORMAL HIGH (ref 0.44–1.00)
GFR calc Af Amer: >60 mL/min (ref >60)
GFR calc non Af Amer: 58 mL/min — ABNORMAL LOW (ref >60)
Glucose, Bld: 99 mg/dL (ref 70–99)
Potassium: 3.3 mmol/L — ABNORMAL LOW (ref 3.5–5.1)
Sodium: 140 mmol/L (ref 135–145)

## 2020-04-09 LAB — GLUCOSE, CAPILLARY
Glucose-Capillary: 102 mg/dL — ABNORMAL HIGH (ref 70–99)
Glucose-Capillary: 120 mg/dL — ABNORMAL HIGH (ref 70–99)
Glucose-Capillary: 108 mg/dL — ABNORMAL HIGH (ref 70–99)
Glucose-Capillary: 152 mg/dL — ABNORMAL HIGH (ref 70–99)
Glucose-Capillary: 149 mg/dL — ABNORMAL HIGH (ref 70–99)

## 2020-04-09 LAB — MAGNESIUM: Magnesium: 2.1 mg/dL (ref 1.7–2.4)

## 2020-04-09 MED ORDER — POTASSIUM CHLORIDE CRYS ER 20 MEQ PO TBCR
40.0000 meq | EXTENDED_RELEASE_TABLET | Freq: Once | ORAL | Status: AC
  Administered 2020-04-09: 40 meq via ORAL
  Filled 2020-04-09: qty 2

## 2020-04-09 MED ORDER — LIDOCAINE HCL 1 % IJ SOLN
INTRAMUSCULAR | Status: AC
  Administered 2020-04-09: 10 mL via INTRA_ARTICULAR
  Filled 2020-04-09: qty 20

## 2020-04-09 MED ORDER — SODIUM CHLORIDE 0.9 % IV SOLN: INTRAVENOUS | Status: DC

## 2020-04-09 MED ORDER — METHYLPREDNISOLONE ACETATE 80 MG/ML IJ SUSP
INTRAMUSCULAR | Status: AC
  Administered 2020-04-09: 120 mg via INTRA_ARTICULAR
  Filled 2020-04-09: qty 2

## 2020-04-09 MED ORDER — SODIUM CHLORIDE 0.9 % IV BOLUS
1000.0000 mL | Freq: Once | INTRAVENOUS | Status: AC
  Administered 2020-04-09: 1000 mL via INTRAVENOUS

## 2020-04-09 MED ORDER — ROPIVACAINE HCL 5 MG/ML IJ SOLN
INTRAMUSCULAR | Status: AC
  Administered 2020-04-09: 3 mL via INTRA_ARTICULAR
  Filled 2020-04-09: qty 30

## 2020-04-09 NOTE — Progress Notes (Signed)
PROGRESS NOTE    Heather Walker  BWI:[id number] DOB: 1960/11/28 DOA: 04/06/2020 PCP: [name], DO   Brief Narrative: 59 y.o.femalewithhistory of diabetes mellitus type 2, hypertension who was admitted in 2018 for MSSA bacteremia of unclear source at that time patient was admitted with right hip pain. Patient started having chills and rigors yesterday morning with right shoulder pain/upper flank.  When pain is resolved but her right shoulder pain persisted.  CT chest abdomen pelvis was also negative.  MRI of the right shoulder showed adhesive capsulitis therefore arrange for steroid injection, PT/OT.  Assessment & Plan:   Principal Problem:   SIRS (systemic inflammatory response syndrome) (HCC) Active Problems:   Essential hypertension   Asthma   Hypokalemia   DM (diabetes mellitus) type II uncontrolled, periph vascular disorder (HCC)   Asthma, chronic, unspecified asthma severity, uncomplicated  Dizzy and lightheaded upon standing up Moderate dehydration -Given normal saline IV fluid 1 L bolus.  Encourage oral intake. -Replete potassium due to hypokalemia -Check TSH  SIRS, unclear etiology -Resolved, no longer antibiotics  Right shoulder pain due to adhesive capsulitis -MRI of the right shoulder ordered.  Some evidence of frozen shoulder/adhesive capsulitis.  Ortho recommended glenohumeral injection, radiology consulted.  Will push it back to later this afternoon because of her dizziness and lightheadedness  Diabetes mellitus type 2, insulin-dependent -Sliding scale and Accu-Chek  Essential hypertension -Clonidine and amlodipine  Asthma -Are actively wheezing  DVT prophylaxis: Lovenox Code Status: Full code Family Communication: Husband at bedside yesterday  Status is: Inpatient  Remains inpatient appropriate because:Inpatient level of care appropriate due to severity of illness   Dispo: The patient is from: Home              Anticipated d/c is to:  Home              Anticipated d/c date is: 1 day              Patient currently is not medically stable to d/c.  Because of her dizziness, she will be receiving 1 L IV bolus and then reevaluate her prior to her shoulder injection.  We will also need thorough PT/OT evaluation thereafter.  Subjective: Patient was feeling better this morning but later when PT try to work with her she got extremely dizzy which shaking of her lower extremities.  I saw her at bedside and she complained of dizziness but denied any palpitation or shortness of breath.  Review of Systems Otherwise negative except as per HPI, including: General: Denies fever, chills, night sweats or unintended weight loss. Resp: Denies cough, wheezing, shortness of breath. Cardiac: Denies chest pain, palpitations, orthopnea, paroxysmal nocturnal dyspnea. GI: Denies abdominal pain, nausea, vomiting, diarrhea or constipation GU: Denies dysuria, frequency, hesitancy or incontinence MS: Denies muscle aches, joint pain or swelling Neuro: Denies headache, neurologic deficits (focal weakness, numbness, tingling), abnormal gait Psych: Denies anxiety, depression, SI/HI/AVH Skin: Denies new rashes or lesions ID: Denies sick contacts, exotic exposures, travel  Examination:  Constitutional: Not in acute distress Respiratory: Clear to auscultation bilaterally Cardiovascular: Normal sinus rhythm, no rubs Abdomen: Nontender nondistended good bowel sounds Musculoskeletal: No edema noted, limited range of motion of her right shoulder Skin: No rashes seen Neurologic: CN 2-12 grossly intact.  And nonfocal Psychiatric: Normal judgment and insight. Alert and oriented x 3. Normal mood.   Objective: Vitals:   04/08/20 2045 04/09/20 0502 04/09/20 0504 04/09/20 1024  BP: 108/78 111/78 111/78 (!) 142/112  Pulse: 93 77  75 72  Resp: 16 14 14    Temp: 97.9 F (36.6 C) 97.9 F (36.6 C) 97.9 F (36.6 C) 97.8 F (36.6 C)  TempSrc: Oral Oral Oral Oral   SpO2: 93% 98% 99% 100%  Weight:      Height:        Intake/Output Summary (Last 24 hours) at 04/09/2020 1124 Last data filed at 04/09/2020 0600 Gross per 24 hour  Intake 1239.57 ml  Output --  Net 1239.57 ml   Filed Weights   04/06/20 1202  Weight: 89.8 kg     Data Reviewed:   CBC: Recent Labs  Lab 04/06/20 1223 04/07/20 0623  WBC 5.4 5.5  NEUTROABS 3.2 3.6  HGB 12.8 12.3  HCT 39.0 38.0  MCV 83.7 84.1  PLT 247 962   Basic Metabolic Panel: Recent Labs  Lab 04/06/20 1216 04/07/20 0623 04/08/20 0418 04/09/20 0357  NA 140 140 138 140  K 2.7* 2.9* 3.2* 3.3*  CL 101 98 99 105  CO2 26 31 30 28   GLUCOSE 162* 116* 146* 99  BUN 13 8 14 15   CREATININE 0.93 0.89 1.16* 1.07*  CALCIUM 9.0 8.6* 8.7* 8.0*  MG 1.5* 1.7 2.0 2.1  PHOS  --   --  2.7  --    GFR: Estimated Creatinine Clearance: 60.4 mL/min (A) (by C-G formula based on SCr of 1.07 mg/dL (H)). Liver Function Tests: Recent Labs  Lab 04/06/20 1216 04/07/20 0623 04/08/20 0418  AST 21 17 15   ALT 13 14 13   ALKPHOS 89 84 78  BILITOT 0.5 0.8 0.8  PROT 8.1 7.4 7.2  ALBUMIN 4.0 3.7 3.5   No results for input(s): LIPASE, AMYLASE in the last 168 hours. No results for input(s): AMMONIA in the last 168 hours. Coagulation Profile: Recent Labs  Lab 04/06/20 1408  INR 1.0   Cardiac Enzymes: No results for input(s): CKTOTAL, CKMB, CKMBINDEX, TROPONINI in the last 168 hours. BNP (last 3 results) No results for input(s): PROBNP in the last 8760 hours. HbA1C: No results for input(s): HGBA1C in the last 72 hours. CBG: Recent Labs  Lab 04/08/20 1221 04/08/20 1639 04/08/20 2042 04/09/20 0810 04/09/20 1017  GLUCAP 133* 149* 159* 102* 120*   Lipid Profile: No results for input(s): CHOL, HDL, LDLCALC, TRIG, CHOLHDL, LDLDIRECT in the last 72 hours. Thyroid Function Tests: No results for input(s): TSH, T4TOTAL, FREET4, T3FREE, THYROIDAB in the last 72 hours. Anemia Panel: No results for input(s): VITAMINB12,  FOLATE, FERRITIN, TIBC, IRON, RETICCTPCT in the last 72 hours. Sepsis Labs: Recent Labs  Lab 04/06/20 1246 04/06/20 1554 04/07/20 0623 04/08/20 0456  PROCALCITON  --   --   --  <0.10  LATICACIDVEN 3.0* 1.4 1.2  --     Recent Results (from the past 240 hour(s))  Culture, blood (single)     Status: None (Preliminary result)   Collection Time: 04/06/20 12:23 PM   Specimen: BLOOD  Result Value Ref Range Status   Specimen Description   Final    BLOOD RIGHT ANTECUBITAL Performed at [facility], [place] [address], [place], [place] [address]    Special Requests   Final    BOTTLES DRAWN AEROBIC AND ANAEROBIC Blood Culture adequate volume Performed at [facility], [address] [place], [place] [address]    Culture   Final    NO GROWTH 3 DAYS Performed at [facility], [place] [address] [place], [place] [address]    Report Status PENDING  Incomplete  Culture, blood (single)     Status: None (Preliminary result)   Collection Time: 04/06/20  1:55 PM   Specimen: BLOOD  Result Value Ref Range Status   Specimen Description   Final    BLOOD LEFT ANTECUBITAL Performed at [facility], [place] [address] [place], [place] [address]    Special Requests   Final    BOTTLES DRAWN AEROBIC AND ANAEROBIC Blood Culture results may not be optimal due to an inadequate volume of blood received in culture bottles Performed at [facility], [address] [place], [place] [address]    Culture   Final    NO GROWTH 3 DAYS Performed at [facility], [place] [address], [place], [place] [address]    Report Status PENDING  Incomplete  Urine culture     Status: Abnormal   Collection Time: 04/06/20  1:55 PM   Specimen: In/Out Cath Urine  Result Value Ref Range Status   Specimen Description   Final    IN/OUT CATH URINE Performed at [facility], [address] [place], [place] [address]    Special Requests   Final    NONE Performed at [facility], [address] [place], [place] [address]    Culture (A)  Final    <10,000 COLONIES/mL MULTIPLE SPECIES PRESENT, SUGGEST RECOLLECTION   Report Status 04/07/2020 FINAL  Final  SARS Coronavirus 2 by RT PCR (hospital order, performed in [facility] hospital lab) Nasopharyngeal Nasopharyngeal Swab     Status: None   Collection Time: 04/06/20  2:08 PM   Specimen: Nasopharyngeal Swab  Result Value Ref Range Status   SARS Coronavirus 2 NEGATIVE NEGATIVE Final    Comment: (NOTE) SARS-CoV-2 target nucleic acids are NOT DETECTED.  The SARS-CoV-2 RNA is generally detectable in upper and lower respiratory specimens during the acute phase of infection. The lowest concentration of SARS-CoV-2 viral copies this assay can detect is 250 copies / mL. A negative result does not preclude SARS-CoV-2 infection and should not be used as the sole basis for treatment or other patient management decisions.  A negative result may occur with improper specimen collection / handling, submission of specimen other than nasopharyngeal swab, presence of viral mutation(s) within the areas targeted by this assay, and inadequate number of viral copies (<250 copies / mL). A negative result must be combined with clinical observations, patient history, and epidemiological information.  Fact Sheet for Patients:   StrictlyIdeas.no  Fact Sheet for Healthcare Providers: BankingDealers.co.za  This test is not yet approved or  cleared by the [place] FDA and has been authorized for detection and/or diagnosis of SARS-CoV-2 by FDA under an Emergency Use Authorization (EUA).  This EUA will remain in effect (meaning this test can be used) for the duration of the COVID-19 declaration under Section 564(b)(1) of the Act, 21 U.S.C. section 360bbb-3(b)(1), unless the authorization is terminated or revoked sooner.  Performed at [facility], [address] [place], [place] [address]          Radiology Studies: MR SHOULDER RIGHT WO CONTRAST  Result Date: 04/08/2020 CLINICAL DATA:  Right shoulder and right scapular pain EXAM: MRI OF THE RIGHT SHOULDER WITHOUT CONTRAST TECHNIQUE: Multiplanar, multisequence MR imaging of the shoulder was performed. No intravenous contrast was administered. COMPARISON:  X-ray 09/04/2014 FINDINGS: Rotator cuff: Mild infraspinatus tendinosis with tiny rim rent tear of the mid insertional fibers (series 11,  image 9). Supraspinatus and subscapularis tendons are intact with mild tendinosis. Intact teres minor. Muscles: No atrophy or abnormal signal of the muscles of the rotator cuff. Biceps long head: Intact. Mild fluid within the biceps tendon sheath. Acromioclavicular Joint: Mild-moderate arthropathy of the acromioclavicular joint. Trace subacromial/subdeltoid bursal edema without fluid collection. Glenohumeral Joint: No joint effusion. No chondral defect. Labrum: Grossly intact, but evaluation is limited by lack of intraarticular fluid. Bones:  No marrow abnormality, fracture or dislocation. Other: Infiltration of the anatomic fat within the rotator interval. No soft tissue edema or fluid collection. IMPRESSION: 1. No acute osseous abnormality of the right shoulder. 2. Mild rotator cuff tendinosis with tiny rim rent tear of the infraspinatus tendon. 3. Findings which can be seen in the clinical setting of adhesive capsulitis. 4. Mild-moderate arthropathy of the acromioclavicular joint. Electronically Signed   By: Davina Poke D.O.   On: 04/08/2020 14:54        Scheduled Meds: . amLODipine  5 mg Oral Daily  . cloNIDine  0.2 mg Oral BID  . enoxaparin (LOVENOX) injection  40 mg Subcutaneous Q24H  . feeding supplement (ENSURE ENLIVE)  237 mL Oral BID BM  . insulin aspart  0-9 Units Subcutaneous TID WC  . insulin aspart protamine- aspart  22 Units Subcutaneous BID WC   Continuous Infusions: . sodium chloride 75  mL/hr at 04/09/20 0252  . sodium chloride       LOS: 2 days   Time spent= 35 mins    Johnathon Mittal Arsenio Loader, MD [facility]  If 7PM-7AM, please contact night-coverage  04/09/2020, 11:24 AM

## 2020-04-09 NOTE — Evaluation (Signed)
Occupational Therapy Evaluation Patient Details Name: Heather Walker MRN: [id number] DOB: June 25, 1961 Today's Date: 04/09/2020    History of Present Illness 59 y.o. female with history of diabetes mellitus type 2, hypertension who was admitted in 2018 for MSSA bacteremia of unclear source at that time. Pt now admitted with chills, R shoulder pain. Dx of SIRS   Clinical Impression   Heather Walker is a 59 year old woman who initially presents without deficits and demonstrated ability to perform functional mobility,ADLs and in room ambulation. At end of evaluation after standing for grooming task patient became lethargic and lower extremities tremulous and patient reporting she felt like she was going to pass out. Patient's vital signs monitored - with all normal. Rn notified of patient's lethargy and complaints of dizziness and flat affect. Patient will need to follow up with orthopedics at discharge for right shoulder pain and potentially will need outpatient therapy. Will follow patient while in hospital in order to make sure patient's abilities do not decline and be able to discharge home.    Follow Up Recommendations  No OT follow up    Equipment Recommendations  None recommended by OT    Recommendations for Other Services       Precautions / Restrictions Precautions Precautions: Fall Precaution Comments: BLEs tremulous in standing Restrictions Weight Bearing Restrictions: No      Mobility Bed Mobility Overal bed mobility: Modified Independent             General bed mobility comments: increased time to perform transfer but no physical assistance.  Transfers Overall transfer level: Needs assistance Equipment used: Rolling walker (2 wheeled) Transfers: Sit to/from [facility] Sit to Stand: Min assist Stand pivot transfers: Min assist       General transfer comment: initially patinet performing mobility well and without assistance. ambulated to  bathroom holding onto iv pole. performed grooming in standing x 10 minutes. After grooming task patient appears fatigued and knees wobbly. Therapist had patient sit on commode for safety. +2 for safety to transfer patient tor ecliner.VCs for technique/safety, min A to rise/steady, increased time (seemed to be increased processing time) to take a few pivotal steps to recliner from commode, pt lethargic with tremulous lower extremities and knees appearing to want to buckle.    Balance Overall balance assessment: No apparent balance deficits (not formally assessed) (initially) Sitting-balance support: Feet supported;Single extremity supported Sitting balance-Leahy Scale: Poor Sitting balance - Comments: pt leaning R onto wall in bathroom while sitting on commode, lethargic but responds to verbal stimulii, vital signs stable, BP 142/97 siotting, HR 86, SaO2 96% room air   Standing balance support: Single extremity supported Standing balance-Leahy Scale: Fair Standing balance comment: single UE support for dynamic standing                           ADL either performed or assessed with clinical judgement   ADL Overall ADL's : Needs assistance/impaired Eating/Feeding: Independent   Grooming: Wash/dry hands;Wash/dry face;Oral care;Standing;Supervision/safety Grooming Details (indicate cue type and reason): standing at sink. Upper Body Bathing: Standing;Supervision/ safety Upper Body Bathing Details (indicate cue type and reason): performed sponge bathing at sink, standing, or bent over counter Lower Body Bathing: Sit to/from stand;Supervison/ safety Lower Body Bathing Details (indicate cue type and reason): performed sponge bathing at sink, standing, or bent over counter Upper Body Dressing : Supervision/safety;Standing   Lower Body Dressing: Supervision/safety;Sit to/from stand Lower Body Dressing Details (indicate  cue type and reason): able to donn socks at [employer] Transfer:  Supervision/safety   Toileting- Water quality scientist and Hygiene: Supervision/safety   Tub/ Banker: Supervision/safety           Vision   Vision Assessment?: No apparent visual deficits     Perception     Praxis      Pertinent Vitals/Pain Pain Assessment: No/denies pain     Hand Dominance Right   Extremity/Trunk Assessment Upper Extremity Assessment Upper Extremity Assessment: Overall WFL for tasks assessed;RUE deficits/detail RUE Deficits / Details: No significant complaints of pain or ROM difficulties.   Lower Extremity Assessment Lower Extremity Assessment: Defer to PT evaluation   Cervical / Trunk Assessment Cervical / Trunk Assessment: Normal   Communication Communication Communication: No difficulties   Cognition Arousal/Alertness: Awake/alert (became more lethargic at end of evaluation.) Behavior During Therapy: Flat affect Overall Cognitive Status: Within Functional Limits for tasks assessed                                 General Comments: lethargic, increased time to respond to questions/commands but is fully oriented and can follow commands   General Comments       Exercises     Shoulder Instructions      Home Living Family/patient expects to be discharged to:: Private residence Living Arrangements: Spouse/significant other Available Help at Discharge: Available 24 hours/day Type of Home: Apartment Home Access: Stairs to enter [employer] of Steps: 3 Entrance Stairs-Rails: None Home Layout: One level     Bathroom Shower/Tub: Teacher, early years/pre: Standard     Home Equipment: Marine scientist - single point   Additional Comments: powerchair      Prior Functioning/Environment Level of Independence: Independent        Comments: Enjoys cooking        OT Problem List: Decreased activity tolerance      OT Treatment/Interventions: Self-care/ADL training;Therapeutic  activities;Patient/family education    OT Goals(Current goals can be found in the care plan section) Acute Rehab OT Goals Patient Stated Goal: return home OT Goal Formulation: With patient Time For Goal Achievement: 04/23/20 Potential to Achieve Goals: Good  OT Frequency: Min 2X/week   Barriers to D/C:            Co-evaluation PT/OT/SLP Co-Evaluation/Treatment: Yes Reason for Co-Treatment: For patient/therapist safety;To address functional/ADL transfers PT goals addressed during session: Mobility/safety with mobility OT goals addressed during session: ADL's and self-care      AM-PAC OT "6 Clicks" Daily Activity     Outcome Measure Help from another person eating meals?: None Help from another person taking care of personal grooming?: None Help from another person toileting, which includes using toliet, bedpan, or urinal?: None Help from another person bathing (including washing, rinsing, drying)?: None Help from another person to put on and taking off regular upper body clothing?: None Help from another person to put on and taking off regular lower body clothing?: None 6 Click Score: 24   End of Session Nurse Communication:  (Patient status and vital signs.)  Activity Tolerance:  (Patient became lethargic and reporting "it's like I'm gonna pass out" at end of evaluation.) Patient left: in chair;with call bell/phone within reach (legs elevated.)  OT Visit Diagnosis: Muscle weakness (generalized) (M62.81)                Time: 8242-3536 OT Time Calculation (min): 30 min Charges:  OT General Charges $OT Visit: 1 Visit OT Evaluation $OT Eval Low Complexity: 1 Low  Blanca Carreon, OTR/L [place]  Office [phone] Pager: [place] 04/09/2020, 2:10 PM

## 2020-04-09 NOTE — Evaluation (Signed)
Physical Therapy Evaluation Patient Details Name: Heather Walker MRN: [id number] DOB: November 05, 1960 Today's Date: 04/09/2020   History of Present Illness  59 y.o. female with history of diabetes mellitus type 2, hypertension who was admitted in 2018 for MSSA bacteremia of unclear source at that time. Pt now admitted with chills, R shoulder pain. Dx of SIRS  Clinical Impression  Pt admitted with above diagnosis. Pt ambulated to bathroom with OT prior to my arrival. In bathroom pt stood for 10 minutes performing ADLs at sink, then LEs became tremulous and she was assisted to commode. Pt on commode upon my arrival. Pt lethargic, but responsive to verbal stimulii, fully oriented, can follow commands with increased time. Min A to stand and take a few pivotal steps to recliner, BLEs tremulous in standing. Vital signs stabile (see flowsheets, CBG 120).  Pt currently with functional limitations due to the deficits listed below (see PT Problem List). Pt will benefit from skilled PT to increase their independence and safety with mobility to allow discharge to the venue listed below.       Follow Up Recommendations Supervision for mobility/OOB;Supervision/Assistance - 24 hour;SNF (at present, needs SNF, hopefully lethargy and tremulous LEs will resolve and pt can return home.)    Equipment Recommendations  Rolling walker with 5" wheels    Recommendations for Other Services       Precautions / Restrictions Precautions Precautions: Fall Precaution Comments: BLEs tremulous in standing Restrictions Weight Bearing Restrictions: No      Mobility  Bed Mobility               General bed mobility comments: up on commode in bathroom (had just stood at sink with OT for 10 minutes performing ADLs, LEs became tremulous so sat on commode where pt felt "woozy" and was lethargic  Transfers Overall transfer level: Needs assistance Equipment used: Rolling walker (2 wheeled) Transfers: Sit to/from  [facility] Sit to Stand: Min assist Stand pivot transfers: Min assist       General transfer comment: VCs for technique/safety, min A to rise/steady, increased time (seemed to be increased processing time) to take a few pivotal steps to recliner from commode, pt lethargic  Ambulation/Gait             General Gait Details: pt ambulated into bathroom without AD with OT prior to PT arrival, pt became tremulous in standing and was seated on toilet upon my arrival  Stairs            Wheelchair Mobility    Modified Rankin (Stroke Patients Only)       Balance Overall balance assessment: Needs assistance Sitting-balance support: Feet supported;Single extremity supported Sitting balance-Leahy Scale: Poor Sitting balance - Comments: pt leaning R onto wall in bathroom while sitting on commode, lethargic but responds to verbal stimulii, vital signs stable, BP 142/97 siotting, HR 86, SaO2 96% room air   Standing balance support: Single extremity supported Standing balance-Leahy Scale: Fair Standing balance comment: single UE support for dynamic standing                             Pertinent Vitals/Pain Pain Assessment: No/denies pain    Home Living Family/patient expects to be discharged to:: Private residence Living Arrangements: Spouse/significant other Available Help at Discharge: Available 24 hours/day Type of Home: Apartment Home Access: Stairs to enter Entrance Stairs-Rails: None Entrance Stairs-Number of Steps: 3 Home Layout: One level Home Equipment: Shower  seat;Cane - single point Additional Comments: powerchair    Prior Function Level of Independence: Independent         Comments: Enjoys cooking     Hand Dominance   Dominant Hand: Right    Extremity/Trunk Assessment   Upper Extremity Assessment Upper Extremity Assessment: Defer to OT evaluation    Lower Extremity Assessment Lower Extremity Assessment: Overall WFL  for tasks assessed (B knee ext +4/5; sensation intact to light touch,  full assessment limited 2* lethargy)    Cervical / Trunk Assessment Cervical / Trunk Assessment: Normal  Communication   Communication: No difficulties  Cognition Arousal/Alertness: Lethargic Behavior During Therapy: Flat affect Overall Cognitive Status: Within Functional Limits for tasks assessed                                 General Comments: lethargic, increased time to respond to questions/commands but is fully oriented and can follow commands      General Comments      Exercises     Assessment/Plan    PT Assessment Patient needs continued PT services  PT Problem List Decreased mobility       PT Treatment Interventions Gait training;Functional mobility training;Therapeutic activities;Therapeutic exercise;Patient/family education    PT Goals (Current goals can be found in the Care Plan section)  Acute Rehab PT Goals Patient Stated Goal: return home PT Goal Formulation: With patient Time For Goal Achievement: 04/23/20 Potential to Achieve Goals: Good    Frequency Min 3X/week   Barriers to discharge        Co-evaluation PT/OT/SLP Co-Evaluation/Treatment: Yes Reason for Co-Treatment: Necessary to address cognition/behavior during functional activity;For patient/therapist safety;To address functional/ADL transfers PT goals addressed during session: Mobility/safety with mobility;Balance;Proper use of DME         AM-PAC PT "6 Clicks" Mobility  Outcome Measure Help needed turning from your back to your side while in a flat bed without using bedrails?: A Little Help needed moving from lying on your back to sitting on the side of a flat bed without using bedrails?: A Little Help needed moving to and from a bed to a chair (including a wheelchair)?: A Little Help needed standing up from a chair using your arms (e.g., wheelchair or bedside chair)?: A Little Help needed to walk in  hospital room?: A Lot Help needed climbing 3-5 steps with a railing? : Total 6 Click Score: 15    End of Session Equipment Utilized During Treatment: Gait belt Activity Tolerance: Patient limited by lethargy Patient left: in chair;with call bell/phone within reach;with nursing/sitter in room Nurse Communication: Mobility status PT Visit Diagnosis: Difficulty in walking, not elsewhere classified (R26.2)    Time: 4332-9518 PT Time Calculation (min) (ACUTE ONLY): 10 min   Charges:   PT Evaluation $PT Eval Low Complexity: 1 Low          Philomena Doheny PT 04/09/2020  Acute Rehabilitation Services Pager [phone] Office [phone]

## 2020-04-10 LAB — CBC
HCT: 35.7 % — ABNORMAL LOW (ref 36.0–46.0)
Hemoglobin: 11.8 g/dL — ABNORMAL LOW (ref 12.0–15.0)
MCH: 27.7 pg (ref 26.0–34.0)
MCHC: 33.1 g/dL (ref 30.0–36.0)
MCV: 83.8 fL (ref 80.0–100.0)
Platelets: 192 10*3/uL (ref 150–400)
RBC: 4.26 MIL/uL (ref 3.87–5.11)
RDW: 14.2 % (ref 11.5–15.5)
WBC: 4.5 10*3/uL (ref 4.0–10.5)
nRBC: 0 % (ref 0.0–0.2)

## 2020-04-10 LAB — BASIC METABOLIC PANEL
Anion gap: 9 (ref 5–15)
BUN: 13 mg/dL (ref 6–20)
CO2: 26 mmol/L (ref 22–32)
Calcium: 8.4 mg/dL — ABNORMAL LOW (ref 8.9–10.3)
Chloride: 104 mmol/L (ref 98–111)
Creatinine, Ser: 0.92 mg/dL (ref 0.44–1.00)
GFR calc Af Amer: >60 mL/min (ref >60)
GFR calc non Af Amer: >60 mL/min (ref >60)
Glucose, Bld: 226 mg/dL — ABNORMAL HIGH (ref 70–99)
Potassium: 3.7 mmol/L (ref 3.5–5.1)
Sodium: 139 mmol/L (ref 135–145)

## 2020-04-10 LAB — GLUCOSE, CAPILLARY
Glucose-Capillary: 166 mg/dL — ABNORMAL HIGH (ref 70–99)
Glucose-Capillary: 129 mg/dL — ABNORMAL HIGH (ref 70–99)
Glucose-Capillary: 183 mg/dL — ABNORMAL HIGH (ref 70–99)
Glucose-Capillary: 158 mg/dL — ABNORMAL HIGH (ref 70–99)

## 2020-04-10 LAB — MAGNESIUM: Magnesium: 1.5 mg/dL — ABNORMAL LOW (ref 1.7–2.4)

## 2020-04-10 MED ORDER — OXYCODONE HCL 5 MG PO TABS
5.0000 mg | ORAL_TABLET | ORAL | Status: DC | PRN
  Administered 2020-04-10 – 2020-04-11 (×3): 5 mg via ORAL
  Filled 2020-04-10 (×3): qty 1

## 2020-04-10 MED ORDER — DICLOFENAC SODIUM 1 % EX GEL
2.0000 g | Freq: Four times a day (QID) | CUTANEOUS | Status: DC | PRN
  Filled 2020-04-10: qty 100

## 2020-04-10 MED ORDER — MORPHINE SULFATE (PF) 2 MG/ML IV SOLN
2.0000 mg | Freq: Once | INTRAVENOUS | Status: AC
  Administered 2020-04-10: 2 mg via INTRAVENOUS
  Filled 2020-04-10: qty 1

## 2020-04-10 MED ORDER — MAGNESIUM SULFATE 4 GM/100ML IV SOLN
4.0000 g | Freq: Once | INTRAVENOUS | Status: AC
  Administered 2020-04-10: 4 g via INTRAVENOUS
  Filled 2020-04-10: qty 100

## 2020-04-10 MED ORDER — SODIUM CHLORIDE 0.9 % IV SOLN: Freq: Once | INTRAVENOUS | Status: AC

## 2020-04-10 MED ORDER — POTASSIUM CHLORIDE CRYS ER 20 MEQ PO TBCR
40.0000 meq | EXTENDED_RELEASE_TABLET | Freq: Once | ORAL | Status: AC
  Administered 2020-04-10: 40 meq via ORAL
  Filled 2020-04-10: qty 2

## 2020-04-10 NOTE — Care Management Important Message (Signed)
Important Message  Patient Details IM Letter given to [place] Case Manager to present to the Patient Name: Heather Walker MRN: [id number] Date of Birth: 1960-10-22   Medicare Important Message Given:  Yes     [name], [name] 04/10/2020, 10:19 AM

## 2020-04-10 NOTE — Progress Notes (Signed)
PROGRESS NOTE    Heather Walker  IDP:[id number] DOB: 01/27/1961 DOA: 04/06/2020 PCP: [name], DO   Brief Narrative: 59 y.o.femalewithhistory of diabetes mellitus type 2, hypertension who was admitted in 2018 for MSSA bacteremia of unclear source at that time patient was admitted with right hip pain. Patient started having chills and rigors yesterday morning with right shoulder pain/upper flank.  When pain is resolved but her right shoulder pain persisted.  CT chest abdomen pelvis was also negative.  MRI of the right shoulder showed adhesive capsulitis therefore she underwent shoulder injection on 6/24 which he tolerated well.  Assessment & Plan:   Principal Problem:   SIRS (systemic inflammatory response syndrome) (HCC) Active Problems:   Essential hypertension   Asthma   Hypokalemia   DM (diabetes mellitus) type II uncontrolled, periph vascular disorder (HCC)   Asthma, chronic, unspecified asthma severity, uncomplicated  Dizzy and lightheaded upon standing up Moderate dehydration -Patient is still orthostatic this morning, TSH within normal limits -Replete electrolytes -1 L normal saline bolus followed by 100 cc/h normal saline  SIRS, unclear etiology -Resolved, no longer antibiotics  Right shoulder pain due to adhesive capsulitis -MRI of the right shoulder -confirm adhesive capsulitis.  Underwent successful steroid injection by radiology.  Outpatient follow-up with orthopedic as necessary.  Diabetes mellitus type 2, insulin-dependent -Sliding scale and Accu-Chek  Essential hypertension -Clonidine and amlodipine  Asthma -Are actively wheezing  DVT prophylaxis: Lovenox Code Status: Full code Family Communication: Husband at bedside yesterday  Status is: Inpatient  Remains inpatient appropriate because:Inpatient level of care appropriate due to severity of illness   Dispo: The patient is from: Home              Anticipated d/c is to: Home               Anticipated d/c date is: 1 day              Patient currently is not medically stable to d/c.  Patient is still orthostatic positive, feeling quite dizzy.  Maintain hospital stay for aggressive IV fluids.  Subjective: This morning patient is still feeling dizzy especially upon standing up.  She is orthostatic positive.  Denies any chest pain, remains afebrile.  Review of Systems Otherwise negative except as per HPI, including: General: Denies fever, chills, night sweats or unintended weight loss. Resp: Denies cough, wheezing, shortness of breath. Cardiac: Denies chest pain, palpitations, orthopnea, paroxysmal nocturnal dyspnea. GI: Denies abdominal pain, nausea, vomiting, diarrhea or constipation GU: Denies dysuria, frequency, hesitancy or incontinence MS: Denies muscle aches, joint pain or swelling Neuro: Denies headache, neurologic deficits (focal weakness, numbness, tingling), abnormal gait Psych: Denies anxiety, depression, SI/HI/AVH Skin: Denies new rashes or lesions ID: Denies sick contacts, exotic exposures, travel  Examination:  Constitutional: Not in acute distress, dry mouth Respiratory: Clear to auscultation bilaterally Cardiovascular: Normal sinus rhythm, no rubs Abdomen: Nontender nondistended good bowel sounds Musculoskeletal: No edema noted Skin: No rashes seen Neurologic: CN 2-12 grossly intact.  And nonfocal Psychiatric: Normal judgment and insight. Alert and oriented x 3. Normal mood.  Objective: Vitals:   04/10/20 0828 04/10/20 0831 04/10/20 0832 04/10/20 0834  BP: (!) 142/97 (!) 151/109 (!) 137/100 (!) 162/117  Pulse: 78 86 100 (!) 104  Resp: 14 15 16 16   Temp:      TempSrc:      SpO2: 97% 98% 99% 96%  Weight:      Height:        Intake/Output  Summary (Last 24 hours) at 04/10/2020 1131 Last data filed at 04/10/2020 1053 Gross per 24 hour  Intake 1957.8 ml  Output 0 ml  Net 1957.8 ml   Filed Weights   04/06/20 1202  Weight: 89.8 kg     Data  Reviewed:   CBC: Recent Labs  Lab 04/06/20 1223 04/07/20 0623 04/10/20 0339  WBC 5.4 5.5 4.5  NEUTROABS 3.2 3.6  --   HGB 12.8 12.3 11.8*  HCT 39.0 38.0 35.7*  MCV 83.7 84.1 83.8  PLT 247 248 462   Basic Metabolic Panel: Recent Labs  Lab 04/06/20 1216 04/07/20 0623 04/08/20 0418 04/09/20 0357 04/10/20 0339  NA 140 140 138 140 139  K 2.7* 2.9* 3.2* 3.3* 3.7  CL 101 98 99 105 104  CO2 26 31 30 28 26   GLUCOSE 162* 116* 146* 99 226*  BUN 13 8 14 15 13   CREATININE 0.93 0.89 1.16* 1.07* 0.92  CALCIUM 9.0 8.6* 8.7* 8.0* 8.4*  MG 1.5* 1.7 2.0 2.1 1.5*  PHOS  --   --  2.7  --   --    GFR: Estimated Creatinine Clearance: 70.3 mL/min (by C-G formula based on SCr of 0.92 mg/dL). Liver Function Tests: Recent Labs  Lab 04/06/20 1216 04/07/20 0623 04/08/20 0418  AST 21 17 15   ALT 13 14 13   ALKPHOS 89 84 78  BILITOT 0.5 0.8 0.8  PROT 8.1 7.4 7.2  ALBUMIN 4.0 3.7 3.5   No results for input(s): LIPASE, AMYLASE in the last 168 hours. No results for input(s): AMMONIA in the last 168 hours. Coagulation Profile: Recent Labs  Lab 04/06/20 1408  INR 1.0   Cardiac Enzymes: No results for input(s): CKTOTAL, CKMB, CKMBINDEX, TROPONINI in the last 168 hours. BNP (last 3 results) No results for input(s): PROBNP in the last 8760 hours. HbA1C: No results for input(s): HGBA1C in the last 72 hours. CBG: Recent Labs  Lab 04/09/20 1017 04/09/20 1219 04/09/20 1612 04/09/20 2223 04/10/20 0810  GLUCAP 120* 108* 152* 149* 166*   Lipid Profile: No results for input(s): CHOL, HDL, LDLCALC, TRIG, CHOLHDL, LDLDIRECT in the last 72 hours. Thyroid Function Tests: Recent Labs    04/09/20 1211  TSH 1.548   Anemia Panel: No results for input(s): VITAMINB12, FOLATE, FERRITIN, TIBC, IRON, RETICCTPCT in the last 72 hours. Sepsis Labs: Recent Labs  Lab 04/06/20 1246 04/06/20 1554 04/07/20 0623 04/08/20 0456  PROCALCITON  --   --   --  <0.10  LATICACIDVEN 3.0* 1.4 1.2  --      Recent Results (from the past 240 hour(s))  Culture, blood (single)     Status: None (Preliminary result)   Collection Time: 04/06/20 12:23 PM   Specimen: BLOOD  Result Value Ref Range Status   Specimen Description   Final    BLOOD RIGHT ANTECUBITAL Performed at [facility], [place] [address], [place], [place] [address]    Special Requests   Final    BOTTLES DRAWN AEROBIC AND ANAEROBIC Blood Culture adequate volume Performed at [facility], [address] [place], [place] [address]    Culture   Final    NO GROWTH 3 DAYS Performed at [facility], [place] [address], [place], [place] [address]    Report Status PENDING  Incomplete  Culture, blood (single)     Status: None (Preliminary result)   Collection Time: 04/06/20  1:55 PM   Specimen: BLOOD  Result Value Ref Range Status  Specimen Description   Final    BLOOD LEFT ANTECUBITAL Performed at [facility], [place] [address], [place], [place] [address]    Special Requests   Final    BOTTLES DRAWN AEROBIC AND ANAEROBIC Blood Culture results may not be optimal due to an inadequate volume of blood received in culture bottles Performed at [facility], [address] [place], [place] [address]    Culture   Final    NO GROWTH 3 DAYS Performed at [facility], [place] [address], [place], [place] [address]    Report Status PENDING  Incomplete  Urine culture     Status: Abnormal   Collection Time: 04/06/20  1:55 PM   Specimen: In/Out Cath Urine  Result Value Ref Range Status   Specimen Description   Final    IN/OUT CATH URINE Performed at [facility], [address] [place], [place] [address]    Special Requests   Final    NONE Performed at [facility], [address] [place], [place] [address]    Culture (A)  Final    <10,000 COLONIES/mL MULTIPLE SPECIES PRESENT, SUGGEST RECOLLECTION   Report Status 04/07/2020 FINAL  Final  SARS Coronavirus 2 by RT PCR  (hospital order, performed in [facility] hospital lab) Nasopharyngeal Nasopharyngeal Swab     Status: None   Collection Time: 04/06/20  2:08 PM   Specimen: Nasopharyngeal Swab  Result Value Ref Range Status   SARS Coronavirus 2 NEGATIVE NEGATIVE Final    Comment: (NOTE) SARS-CoV-2 target nucleic acids are NOT DETECTED.  The SARS-CoV-2 RNA is generally detectable in upper and lower respiratory specimens during the acute phase of infection. The lowest concentration of SARS-CoV-2 viral copies this assay can detect is 250 copies / mL. A negative result does not preclude SARS-CoV-2 infection and should not be used as the sole basis for treatment or other patient management decisions.  A negative result may occur with improper specimen collection / handling, submission of specimen other than nasopharyngeal swab, presence of viral mutation(s) within the areas targeted by this assay, and inadequate number of viral copies (<250 copies / mL). A negative result must be combined with clinical observations, patient history, and epidemiological information.  Fact Sheet for Patients:   StrictlyIdeas.no  Fact Sheet for Healthcare Providers: BankingDealers.co.za  This test is not yet approved or  cleared by the [place] FDA and has been authorized for detection and/or diagnosis of SARS-CoV-2 by FDA under an Emergency Use Authorization (EUA).  This EUA will remain in effect (meaning this test can be used) for the duration of the COVID-19 declaration under Section 564(b)(1) of the Act, 21 U.S.C. section 360bbb-3(b)(1), unless the authorization is terminated or revoked sooner.  Performed at [facility], [address] [place], [place] [address]          Radiology Studies: MR SHOULDER RIGHT WO CONTRAST  Result Date: 04/08/2020 CLINICAL DATA:  Right shoulder and right scapular pain EXAM: MRI OF THE RIGHT SHOULDER WITHOUT  CONTRAST TECHNIQUE: Multiplanar, multisequence MR imaging of the shoulder was performed. No intravenous contrast was administered. COMPARISON:  X-ray 09/04/2014 FINDINGS: Rotator cuff: Mild infraspinatus tendinosis with tiny rim rent tear of the mid insertional fibers (series 11, image 9). Supraspinatus and subscapularis tendons are intact with mild tendinosis. Intact teres minor. Muscles: No atrophy or abnormal signal of the muscles of the rotator cuff. Biceps long head: Intact. Mild fluid  within the biceps tendon sheath. Acromioclavicular Joint: Mild-moderate arthropathy of the acromioclavicular joint. Trace subacromial/subdeltoid bursal edema without fluid collection. Glenohumeral Joint: No joint effusion. No chondral defect. Labrum: Grossly intact, but evaluation is limited by lack of intraarticular fluid. Bones:  No marrow abnormality, fracture or dislocation. Other: Infiltration of the anatomic fat within the rotator interval. No soft tissue edema or fluid collection. IMPRESSION: 1. No acute osseous abnormality of the right shoulder. 2. Mild rotator cuff tendinosis with tiny rim rent tear of the infraspinatus tendon. 3. Findings which can be seen in the clinical setting of adhesive capsulitis. 4. Mild-moderate arthropathy of the acromioclavicular joint. Electronically Signed   By: Davina Poke D.O.   On: 04/08/2020 14:54   DG FLUORO GUIDED NEEDLE PLC ASPIRATION/INJECTION LOC  Result Date: 04/09/2020 CLINICAL DATA:  Adhesive capsulitis EXAM: RIGHT SHOULDER INJECTION UNDER FLUOROSCOPY TECHNIQUE: An appropriate skin entrance site was determined. The site was marked, prepped with Betadine, draped in the usual sterile fashion, and infiltrated locally with buffered Lidocaine. 22 gauge spinal needle was advanced to the superomedial margin of the humeral head under intermittent fluoroscopy. 5 ml of Omnipaque 300 was then used to opacify the right shoulder capsule. Subsequently 3 mL of ropivacaine and 1.5 mL of  Depo-Medrol were administered. No immediate complication. FLUOROSCOPY TIME:  Fluoroscopy Time:  18 seconds Radiation Exposure Index (if provided by the fluoroscopic device): 2.1 mGy FINDINGS: Injection of contrast after needle placement demonstrates intra-articular location. IMPRESSION: Technically successful right shoulder steroid injection. Electronically Signed   By: Macy Mis M.D.   On: 04/09/2020 16:00        Scheduled Meds:  amLODipine  5 mg Oral Daily   cloNIDine  0.2 mg Oral BID   enoxaparin (LOVENOX) injection  40 mg Subcutaneous Q24H   feeding supplement (ENSURE ENLIVE)  237 mL Oral BID BM   insulin aspart  0-9 Units Subcutaneous TID WC   insulin aspart protamine- aspart  22 Units Subcutaneous BID WC   Continuous Infusions:  sodium chloride 100 mL/hr at 04/10/20 1119   magnesium sulfate bolus IVPB 4 g (04/10/20 0940)     LOS: 3 days   Time spent= 35 mins    Corben Auzenne Arsenio Loader, MD [facility]  If 7PM-7AM, please contact night-coverage  04/10/2020, 11:31 AM

## 2020-04-11 ENCOUNTER — Inpatient Hospital Stay (HOSPITAL_COMMUNITY): Payer: Medicare HMO

## 2020-04-11 LAB — BASIC METABOLIC PANEL
Anion gap: 9 (ref 5–15)
BUN: 13 mg/dL (ref 6–20)
CO2: 25 mmol/L (ref 22–32)
Calcium: 8.2 mg/dL — ABNORMAL LOW (ref 8.9–10.3)
Chloride: 106 mmol/L (ref 98–111)
Creatinine, Ser: 0.71 mg/dL (ref 0.44–1.00)
GFR calc Af Amer: >60 mL/min (ref >60)
GFR calc non Af Amer: >60 mL/min (ref >60)
Glucose, Bld: 161 mg/dL — ABNORMAL HIGH (ref 70–99)
Potassium: 3.4 mmol/L — ABNORMAL LOW (ref 3.5–5.1)
Sodium: 140 mmol/L (ref 135–145)

## 2020-04-11 LAB — CULTURE, BLOOD (SINGLE)
Culture: NO GROWTH
Culture: NO GROWTH
Special Requests: ADEQUATE

## 2020-04-11 LAB — CBC
HCT: 33.8 % — ABNORMAL LOW (ref 36.0–46.0)
Hemoglobin: 11.1 g/dL — ABNORMAL LOW (ref 12.0–15.0)
MCH: 27.3 pg (ref 26.0–34.0)
MCHC: 32.8 g/dL (ref 30.0–36.0)
MCV: 83.3 fL (ref 80.0–100.0)
Platelets: 236 10*3/uL (ref 150–400)
RBC: 4.06 MIL/uL (ref 3.87–5.11)
RDW: 14.4 % (ref 11.5–15.5)
WBC: 7.8 10*3/uL (ref 4.0–10.5)
nRBC: 0 % (ref 0.0–0.2)

## 2020-04-11 LAB — GLUCOSE, CAPILLARY
Glucose-Capillary: 161 mg/dL — ABNORMAL HIGH (ref 70–99)
Glucose-Capillary: 113 mg/dL — ABNORMAL HIGH (ref 70–99)
Glucose-Capillary: 161 mg/dL — ABNORMAL HIGH (ref 70–99)
Glucose-Capillary: 136 mg/dL — ABNORMAL HIGH (ref 70–99)

## 2020-04-11 LAB — BRAIN NATRIURETIC PEPTIDE: B Natriuretic Peptide: 66.2 pg/mL (ref 0.0–100.0)

## 2020-04-11 LAB — MAGNESIUM: Magnesium: 1.9 mg/dL (ref 1.7–2.4)

## 2020-04-11 LAB — CORTISOL-AM, BLOOD: Cortisol - AM: 1.9 ug/dL — ABNORMAL LOW (ref 6.7–22.6)

## 2020-04-11 MED ORDER — LISINOPRIL 10 MG PO TABS
10.0000 mg | ORAL_TABLET | Freq: Every day | ORAL | Status: DC
  Administered 2020-04-11: 10 mg via ORAL
  Filled 2020-04-11: qty 1

## 2020-04-11 MED ORDER — AMLODIPINE BESYLATE 10 MG PO TABS
10.0000 mg | ORAL_TABLET | Freq: Every day | ORAL | Status: DC
  Administered 2020-04-11 – 2020-04-12 (×2): 10 mg via ORAL
  Filled 2020-04-11 (×2): qty 1

## 2020-04-11 MED ORDER — MAGNESIUM SULFATE 4 GM/100ML IV SOLN
4.0000 g | Freq: Once | INTRAVENOUS | Status: AC
  Administered 2020-04-11: 4 g via INTRAVENOUS
  Filled 2020-04-11: qty 100

## 2020-04-11 MED ORDER — COSYNTROPIN 0.25 MG IJ SOLR
0.2500 mg | Freq: Once | INTRAMUSCULAR | Status: AC
  Administered 2020-04-12: 0.25 mg via INTRAVENOUS
  Filled 2020-04-11: qty 0.25

## 2020-04-11 MED ORDER — MORPHINE SULFATE (PF) 2 MG/ML IV SOLN: 1.0000 mg | INTRAVENOUS | Status: DC | PRN

## 2020-04-11 MED ORDER — HYDRALAZINE HCL 20 MG/ML IJ SOLN: 5.0000 mg | Freq: Once | INTRAMUSCULAR | Status: DC | PRN

## 2020-04-11 MED ORDER — POTASSIUM CHLORIDE CRYS ER 20 MEQ PO TBCR
40.0000 meq | EXTENDED_RELEASE_TABLET | Freq: Once | ORAL | Status: AC
  Administered 2020-04-11: 40 meq via ORAL
  Filled 2020-04-11: qty 2

## 2020-04-11 MED ORDER — HYDRALAZINE HCL 20 MG/ML IJ SOLN
5.0000 mg | Freq: Once | INTRAMUSCULAR | Status: AC | PRN
  Administered 2020-04-11: 5 mg via INTRAVENOUS
  Filled 2020-04-11: qty 1

## 2020-04-11 NOTE — Progress Notes (Addendum)
PROGRESS NOTE    Heather Walker  IEP:[id number] DOB: [birth year] DOA: 04/06/2020 PCP: [name], DO   Brief Narrative: 59 y.o.femalewithhistory of diabetes mellitus type 2, hypertension who was admitted in 2018 for MSSA bacteremia of unclear source at that time patient was admitted with right hip pain. Patient started having chills and rigors yesterday morning with right shoulder pain/upper flank.  When pain is resolved but her right shoulder pain persisted.  CT chest abdomen pelvis was also negative.  MRI of the right shoulder showed adhesive capsulitis therefore she underwent shoulder injection on 6/24 which he tolerated well.  Assessment & Plan:   Principal Problem:   SIRS (systemic inflammatory response syndrome) (HCC) Active Problems:   Essential hypertension   Asthma   Hypokalemia   DM (diabetes mellitus) type II uncontrolled, periph vascular disorder (HCC)   Asthma, chronic, unspecified asthma severity, uncomplicated  Dizzy and lightheaded upon standing up Moderate dehydration -Continues to be dizzy, TSH within normal limit -Replete electrolytes -Normal saline 100 cc an hour -MRI brain without contrast -Cortisol a.m. -Discontinue clonidine and bedtime trazodone  Addendum 645pm: Low am Cortisol. Suspicion for Adrenal Insuff, will order ACTH stim test.  SIRS, unclear etiology -Resolved, no longer antibiotics  Right shoulder pain due to adhesive capsulitis -MRI of the right shoulder -confirm adhesive capsulitis.  Underwent successful steroid injection by radiology.  Outpatient follow-up with orthopedic as necessary.  Diabetes mellitus type 2, insulin-dependent -Sliding scale and Accu-Chek  Essential hypertension -Discontinue clonidine due to dizziness.  Increase Norvasc to 10 mg daily.  Add lisinopril 10 mg daily  Asthma -Not actively wheezing  PT/OT evaluation.  DVT prophylaxis: Lovenox Code Status: Full code Family Communication: None  Status  is: Inpatient  Remains inpatient appropriate because:Inpatient level of care appropriate due to severity of illness   Dispo: The patient is from: Home              Anticipated d/c is to: Home              Anticipated d/c date is: 1 day              Patient currently is not medically stable to d/c.  Continues to feel dizzy, unsafe for discharge.  Will need PT/OT evaluation  Subjective: This morning patient is still feeling dizzy especially upon standing up.  She is orthostatic positive.  Denies any chest pain, remains afebrile.  Review of Systems Otherwise negative except as per HPI, including: General: Denies fever, chills, night sweats or unintended weight loss. Resp: Denies cough, wheezing, shortness of breath. Cardiac: Denies chest pain, palpitations, orthopnea, paroxysmal nocturnal dyspnea. GI: Denies abdominal pain, nausea, vomiting, diarrhea or constipation GU: Denies dysuria, frequency, hesitancy or incontinence MS: Denies muscle aches, joint pain or swelling Neuro: Denies headache, neurologic deficits (focal weakness, numbness, tingling), abnormal gait Psych: Denies anxiety, depression, SI/HI/AVH Skin: Denies new rashes or lesions ID: Denies sick contacts, exotic exposures, travel Examination:  Constitutional: Mild distress due to dizziness Respiratory: Clear to auscultation bilaterally Cardiovascular: Normal sinus rhythm, no rubs Abdomen: Nontender nondistended good bowel sounds Musculoskeletal: No edema noted Skin: No rashes seen Neurologic: CN 2-12 grossly intact.  And nonfocal Psychiatric: Normal judgment and insight. Alert and oriented x 3. Normal mood.  Objective: Vitals:   04/11/20 0856 04/11/20 0856 04/11/20 0858 04/11/20 0902  BP: (!) 151/113 (!) 151/113 (!) 178/122 (!) 151/107  Pulse: 78 99 90 74  Resp: 12 12 12 12   Temp:  TempSrc:      SpO2: 99% 98% 99% 100%  Weight:      Height:        Intake/Output Summary (Last 24 hours) at 04/11/2020  0916 Last data filed at 04/10/2020 1600 Gross per 24 hour  Intake 554.63 ml  Output --  Net 554.63 ml   Filed Weights   04/06/20 1202  Weight: 89.8 kg     Data Reviewed:   CBC: Recent Labs  Lab 04/06/20 1223 04/07/20 0623 04/10/20 0339 04/11/20 0815  WBC 5.4 5.5 4.5 7.8  NEUTROABS 3.2 3.6  --   --   HGB 12.8 12.3 11.8* 11.1*  HCT 39.0 38.0 35.7* 33.8*  MCV 83.7 84.1 83.8 83.3  PLT 247 248 192 476   Basic Metabolic Panel: Recent Labs  Lab 04/07/20 0623 04/08/20 0418 04/09/20 0357 04/10/20 0339 04/11/20 0815  NA 140 138 140 139 140  K 2.9* 3.2* 3.3* 3.7 3.4*  CL 98 99 105 104 106  CO2 31 30 28 26 25   GLUCOSE 116* 146* 99 226* 161*  BUN 8 14 15 13 13   CREATININE 0.89 1.16* 1.07* 0.92 0.71  CALCIUM 8.6* 8.7* 8.0* 8.4* 8.2*  MG 1.7 2.0 2.1 1.5* 1.9  PHOS  --  2.7  --   --   --    GFR: Estimated Creatinine Clearance: 80.8 mL/min (by C-G formula based on SCr of 0.71 mg/dL). Liver Function Tests: Recent Labs  Lab 04/06/20 1216 04/07/20 0623 04/08/20 0418  AST 21 17 15   ALT 13 14 13   ALKPHOS 89 84 78  BILITOT 0.5 0.8 0.8  PROT 8.1 7.4 7.2  ALBUMIN 4.0 3.7 3.5   No results for input(s): LIPASE, AMYLASE in the last 168 hours. No results for input(s): AMMONIA in the last 168 hours. Coagulation Profile: Recent Labs  Lab 04/06/20 1408  INR 1.0   Cardiac Enzymes: No results for input(s): CKTOTAL, CKMB, CKMBINDEX, TROPONINI in the last 168 hours. BNP (last 3 results) No results for input(s): PROBNP in the last 8760 hours. HbA1C: No results for input(s): HGBA1C in the last 72 hours. CBG: Recent Labs  Lab 04/10/20 0810 04/10/20 1208 04/10/20 1801 04/10/20 2150 04/11/20 0757  GLUCAP 166* 129* 183* 158* 161*   Lipid Profile: No results for input(s): CHOL, HDL, LDLCALC, TRIG, CHOLHDL, LDLDIRECT in the last 72 hours. Thyroid Function Tests: Recent Labs    04/09/20 1211  TSH 1.548   Anemia Panel: No results for input(s): VITAMINB12, FOLATE,  FERRITIN, TIBC, IRON, RETICCTPCT in the last 72 hours. Sepsis Labs: Recent Labs  Lab 04/06/20 1246 04/06/20 1554 04/07/20 0623 04/08/20 0456  PROCALCITON  --   --   --  <0.10  LATICACIDVEN 3.0* 1.4 1.2  --     Recent Results (from the past 240 hour(s))  Culture, blood (single)     Status: None   Collection Time: 04/06/20 12:23 PM   Specimen: BLOOD  Result Value Ref Range Status   Specimen Description   Final    BLOOD RIGHT ANTECUBITAL Performed at [facility], [place] [address] [place], [place] [address]    Special Requests   Final    BOTTLES DRAWN AEROBIC AND ANAEROBIC Blood Culture adequate volume Performed at [facility], [address] [place], [place] [address]    Culture   Final    NO GROWTH 5 DAYS Performed at [facility], [place] [address], [place], [place] [address]    Report Status 04/11/2020  FINAL  Final  Culture, blood (single)     Status: None   Collection Time: 04/06/20  1:55 PM   Specimen: BLOOD  Result Value Ref Range Status   Specimen Description   Final    BLOOD LEFT ANTECUBITAL Performed at [facility], [place] [address], [place], [place] [address]    Special Requests   Final    BOTTLES DRAWN AEROBIC AND ANAEROBIC Blood Culture results may not be optimal due to an inadequate volume of blood received in culture bottles Performed at [facility], [address] [place], [place] [address]    Culture   Final    NO GROWTH 5 DAYS Performed at [facility], [place] [address] [place], [place] [address]    Report Status 04/11/2020 FINAL  Final  Urine culture     Status: Abnormal   Collection Time: 04/06/20  1:55 PM   Specimen: In/Out Cath Urine  Result Value Ref Range Status   Specimen Description   Final    IN/OUT CATH URINE Performed at [facility], [address] [place], [place] [address]    Special Requests   Final    NONE Performed at [facility], [address] [place], [place] [address]    Culture (A)  Final    <10,000 COLONIES/mL MULTIPLE SPECIES PRESENT, SUGGEST RECOLLECTION   Report Status 04/07/2020 FINAL  Final  SARS Coronavirus 2 by RT PCR (hospital order, performed in [facility] hospital lab) Nasopharyngeal Nasopharyngeal Swab     Status: None   Collection Time: 04/06/20  2:08 PM   Specimen: Nasopharyngeal Swab  Result Value Ref Range Status   SARS Coronavirus 2 NEGATIVE NEGATIVE Final    Comment: (NOTE) SARS-CoV-2 target nucleic acids are NOT DETECTED.  The SARS-CoV-2 RNA is generally detectable in upper and lower respiratory specimens during the acute phase of infection. The lowest concentration of SARS-CoV-2 viral copies this assay can detect is 250 copies / mL. A negative result does not preclude SARS-CoV-2 infection and should not be used as the sole basis for treatment or other patient management decisions.  A negative result may occur with improper specimen collection / handling, submission of specimen other than nasopharyngeal swab, presence of viral mutation(s) within the areas targeted by this assay, and inadequate number of viral copies (<250 copies / mL). A negative result must be combined with clinical observations, patient history, and epidemiological information.  Fact Sheet for Patients:   StrictlyIdeas.no  Fact Sheet for Healthcare Providers: BankingDealers.co.za  This test is not yet approved or  cleared by the [place] FDA and has been authorized for detection and/or diagnosis of SARS-CoV-2 by FDA under an Emergency Use Authorization (EUA).  This EUA will remain in effect (meaning this test can be used) for the duration of the COVID-19 declaration under Section 564(b)(1) of the Act, 21 U.S.C. section 360bbb-3(b)(1), unless the authorization is terminated or revoked sooner.  Performed at [facility], [address] [place], [place] [address]           Radiology Studies: DG FLUORO GUIDED NEEDLE [facility] ASPIRATION/INJECTION LOC  Result Date: 04/09/2020 CLINICAL DATA:  Adhesive capsulitis EXAM: RIGHT SHOULDER INJECTION UNDER FLUOROSCOPY TECHNIQUE: An appropriate skin entrance site was determined. The site was marked, prepped with Betadine, draped in the usual sterile fashion, and infiltrated locally with buffered Lidocaine. 22 gauge spinal needle was advanced to the superomedial margin of the humeral  head under intermittent fluoroscopy. 5 ml of Omnipaque 300 was then used to opacify the right shoulder capsule. Subsequently 3 mL of ropivacaine and 1.5 mL of Depo-Medrol were administered. No immediate complication. FLUOROSCOPY TIME:  Fluoroscopy Time:  18 seconds Radiation Exposure Index (if provided by the fluoroscopic device): 2.1 mGy FINDINGS: Injection of contrast after needle placement demonstrates intra-articular location. IMPRESSION: Technically successful right shoulder steroid injection. Electronically Signed   By: Macy Mis M.D.   On: 04/09/2020 16:00        Scheduled Meds: . amLODipine  10 mg Oral Daily  . enoxaparin (LOVENOX) injection  40 mg Subcutaneous Q24H  . insulin aspart  0-9 Units Subcutaneous TID WC  . insulin aspart protamine- aspart  22 Units Subcutaneous BID WC  . potassium chloride  40 mEq Oral Once   Continuous Infusions: . sodium chloride 100 mL/hr at 04/10/20 1119  . magnesium sulfate bolus IVPB       LOS: 4 days   Time spent= 35 mins    Jackquline Branca Arsenio Loader, MD [facility]  If 7PM-7AM, please contact night-coverage  04/11/2020, 9:16 AM

## 2020-04-12 LAB — BASIC METABOLIC PANEL
Anion gap: 8 (ref 5–15)
BUN: 14 mg/dL (ref 6–20)
CO2: 28 mmol/L (ref 22–32)
Calcium: 8.3 mg/dL — ABNORMAL LOW (ref 8.9–10.3)
Chloride: 104 mmol/L (ref 98–111)
Creatinine, Ser: 0.77 mg/dL (ref 0.44–1.00)
GFR calc Af Amer: >60 mL/min (ref >60)
GFR calc non Af Amer: >60 mL/min (ref >60)
Glucose, Bld: 129 mg/dL — ABNORMAL HIGH (ref 70–99)
Potassium: 3.2 mmol/L — ABNORMAL LOW (ref 3.5–5.1)
Sodium: 140 mmol/L (ref 135–145)

## 2020-04-12 LAB — CBC
HCT: 36.4 % (ref 36.0–46.0)
Hemoglobin: 12.2 g/dL (ref 12.0–15.0)
MCH: 27.5 pg (ref 26.0–34.0)
MCHC: 33.5 g/dL (ref 30.0–36.0)
MCV: 82.2 fL (ref 80.0–100.0)
Platelets: 281 10*3/uL (ref 150–400)
RBC: 4.43 MIL/uL (ref 3.87–5.11)
RDW: 14.2 % (ref 11.5–15.5)
WBC: 7.4 10*3/uL (ref 4.0–10.5)
nRBC: 0.7 % — ABNORMAL HIGH (ref 0.0–0.2)

## 2020-04-12 LAB — GLUCOSE, CAPILLARY
Glucose-Capillary: 136 mg/dL — ABNORMAL HIGH (ref 70–99)
Glucose-Capillary: 119 mg/dL — ABNORMAL HIGH (ref 70–99)

## 2020-04-12 LAB — MAGNESIUM: Magnesium: 2.5 mg/dL — ABNORMAL HIGH (ref 1.7–2.4)

## 2020-04-12 MED ORDER — AMLODIPINE BESYLATE 10 MG PO TABS: 10.0000 mg | ORAL_TABLET | Freq: Every day | ORAL | 0 refills | Status: DC

## 2020-04-12 MED ORDER — HYDRALAZINE HCL 20 MG/ML IJ SOLN: 10.0000 mg | INTRAMUSCULAR | Status: DC | PRN

## 2020-04-12 MED ORDER — LISINOPRIL 20 MG PO TABS: 20.0000 mg | ORAL_TABLET | Freq: Every day | ORAL | 0 refills | Status: DC

## 2020-04-12 MED ORDER — COSYNTROPIN 0.25 MG IJ SOLR: 0.2500 mg | Freq: Once | INTRAMUSCULAR | Status: DC

## 2020-04-12 MED ORDER — POTASSIUM CHLORIDE CRYS ER 20 MEQ PO TBCR
40.0000 meq | EXTENDED_RELEASE_TABLET | ORAL | Status: AC
  Administered 2020-04-12 (×2): 40 meq via ORAL
  Filled 2020-04-12 (×2): qty 2

## 2020-04-12 MED ORDER — LISINOPRIL 20 MG PO TABS
20.0000 mg | ORAL_TABLET | Freq: Every day | ORAL | Status: DC
  Administered 2020-04-12: 20 mg via ORAL
  Filled 2020-04-12: qty 1

## 2020-04-12 NOTE — TOC Progression Note (Signed)
Transition of Care [facility]) - Progression Note    Patient Details  Name: Heather Walker MRN: [id number] Date of Birth: 06-30-1961  [facility] CM/SW Contact  Joaquin Courts, RN Phone Number: 04/12/2020, 12:24 PM  Clinical Narrative:    CM spoke with patient regarding need for HHPT/OT services.  Patient reports she has had home health in the past and has utilized [facility]. Unfortunately due to contract changes they are now out of network with patient's insurance carrier.  Patient would like an in-network provider.  [facility] arranged for HHPT/OT services.     Expected Discharge Plan: [place] Barriers to Discharge: No Barriers Identified  Expected Discharge Plan and Services Expected Discharge Plan: [place]   Discharge Planning Services: CM Consult Post Acute Care Choice: [place] arrangements for the past 2 months: Apartment Expected Discharge Date: 04/12/20               DME Arranged: N/A DME Agency: NA       HH Arranged: PT, OT HH Agency: [place] Date [facility] Agency Contacted: 04/12/20 Time [place]: 1643 Representative spoke with at [place]: [place] ([place]) Interventions    Readmission Risk Interventions No flowsheet data found.

## 2020-04-12 NOTE — Progress Notes (Signed)
Discharge instructions reviewed with patient utilizing teach back method no questions at this time. Patient being discharged to home with home health services

## 2020-04-12 NOTE — Progress Notes (Signed)
Attending Jeannette Corpus, notified of pt's BP 164/112. New order for one time dose hydralazine written with parameters. Will continue to monitor

## 2020-04-12 NOTE — Discharge Summary (Signed)
Physician Discharge Summary  HARUE PRIBBLE QQI:[id number] DOB: 11-07-1960 DOA: 04/06/2020  PCP: [name], DO  Admit date: 04/06/2020 Discharge date: 04/12/2020  Admitted From: Home Disposition: Home  Recommendations for Outpatient Follow-up:  1. Follow up with PCP in 1-2 weeks 2. Please obtain BMP/CBC in one week your next doctors visit.  3. Norvasc increased to 10 mg daily.  Clonidine discontinued 4. Add lisinopril 10 mg daily 5. Needs outpatient ACTH stim test ASAP and follow-up with PCP and possibly endocrinology.   Discharge Condition: Stable CODE STATUS: Full code Diet recommendation: Diabetic  Brief/Interim Summary: 59 y.o.femalewithhistory of diabetes mellitus type 2, hypertension who was admitted in 2018 for MSSA bacteremia of unclear source at that time patient was admitted with right hip pain. Patient started having chills and rigors yesterday morning with rightshoulderpain/upper flank.  When pain is resolved but her right shoulder pain persisted.  CT chest abdomen pelvis was also negative.  MRI of the right shoulder showed adhesive capsulitis therefore she underwent shoulder injection on 6/24 which he tolerated well.  Due to persistent dizziness, hypokalemia cortisol level checked which were low.  Concerns for adrenal insufficiency.  ACTH stim test was ordered but patient did not want to wait for this to be done in the hospital.  I spoke with the patient and her daughter-in-law (nurse practitioner) over the phone and both agree that they will get this done outpatient.  Rest of the recommendations as stated above.  Otherwise stable for discharge  Assessment & Plan:   Principal Problem:   SIRS (systemic inflammatory response syndrome) (HCC) Active Problems:   Essential hypertension   Asthma   Hypokalemia   DM (diabetes mellitus) type II uncontrolled, periph vascular disorder (HCC)   Asthma, chronic, unspecified asthma severity, uncomplicated  Dizzy and  lightheaded-improved Moderate dehydration, improved Hypokalemia Intermittent dizziness despite of IV fluid.  TSH normal.  MRI brain-negative. Clonidine discontinued.  Low cortisol levels in the hospital.  Concerns for adrenal insufficiency, ACTH stim test ordered but some lab error this morning postponing test until tomorrow morning.  Spoke with the patient and her daughter-in-law, they prefer not to wait another day as she is feeling better and will get it done outpatient.  Instructions have been given.  SIRS, unclear etiology Resolved, no evidence of infection  Right shoulder pain due to adhesive capsulitis -MRI of the right shoulder -confirm adhesive capsulitis.  Underwent successful steroid injection by radiology.  Outpatient follow-up with orthopedic as necessary. PT/OT prior to discharge  Diabetes mellitus type 2, insulin-dependent Resume home meds  Essential hypertension -Discontinue clonidine due to dizziness.  Increase Norvasc to 10 mg daily.  Add lisinopril 10 mg daily  Asthma -Not actively wheezing  PT/OT-Home with supervision.  Body mass index is 36.21 kg/m.     Discharge Diagnoses:  Principal Problem:   SIRS (systemic inflammatory response syndrome) (HCC) Active Problems:   Essential hypertension   Asthma   Hypokalemia   DM (diabetes mellitus) type II uncontrolled, periph vascular disorder (HCC)   Asthma, chronic, unspecified asthma severity, uncomplicated      Consultations:  None  Subjective: Feels okay, wishes to go home.  Discharge Exam: Vitals:   04/12/20 0811 04/12/20 1139  BP: (!) 178/99 (!) 172/101  Pulse: 77 87  Resp: 18   Temp: (!) 97.5 F (36.4 C) 98 F (36.7 C)  SpO2:  100%   Vitals:   04/12/20 0100 04/12/20 0507 04/12/20 0811 04/12/20 1139  BP: (!) 166/103 (!) 174/99 (!) 178/99 Marland Kitchen)  172/101  Pulse: 89 83 77 87  Resp:  18 18   Temp:  97.8 F (36.6 C) (!) 97.5 F (36.4 C) 98 F (36.7 C)  TempSrc:  Oral Oral Oral   SpO2:  96%  100%  Weight:      Height:        General: Pt is alert, awake, not in acute distress Cardiovascular: RRR, S1/S2 +, no rubs, no gallops Respiratory: CTA bilaterally, no wheezing, no rhonchi Abdominal: Soft, NT, ND, bowel sounds + Extremities: no edema, no cyanosis  Discharge Instructions   Allergies as of 04/12/2020      Reactions   Crestor [rosuvastatin Calcium] Anaphylaxis   Metformin And Related Diarrhea   Nausea and diarrhea   Atorvastatin Other (See Comments)   Neck stiffness   Codeine Itching      Medication List    STOP taking these medications   cloNIDine 0.2 MG tablet Commonly known as: CATAPRES     TAKE these medications   amLODipine 10 MG tablet Commonly known as: NORVASC Take 1 tablet (10 mg total) by mouth daily. Start taking on: April 13, 2020 What changed:   medication strength  how much to take   Insulin Pen Needle 32G X 4 MM Misc Inject 1 Device into the skin in the morning and at bedtime. 2x daily   lisinopril 20 MG tablet Commonly known as: ZESTRIL Take 1 tablet (20 mg total) by mouth daily. Start taking on: April 13, 2020   NovoLIN 70/30 FlexPen (70-30) 100 UNIT/ML KwikPen Generic drug: insulin isophane & regular human Inject 22 Units into the skin 2 (two) times daily with a meal.   onetouch ultrasoft lancets Use as instructed to test blood sugar 2 times daily L49.17   OneTouch Delica Lancets 91T Misc 1 Package by Does not apply route in the morning and at bedtime. Use as instructed 2 times daily E11.65   OneTouch Verio test strip Generic drug: glucose blood 1 each by Other route 2 (two) times daily. Use as instructed   Ozempic (0.25 or 0.5 MG/DOSE) 2 MG/1.5ML Sopn Generic drug: Semaglutide(0.25 or 0.5MG /DOS) Inject 0.5 mg into the skin once a week.   traZODone 50 MG tablet Commonly known as: DESYREL TAKE 1/2 TO 1 TABLET(25 TO 50 MG) BY MOUTH AT BEDTIME AS NEEDED FOR SLEEP What changed:   how much to take  how to  take this  when to take this  reasons to take this  additional instructions   triamcinolone cream 0.1 % Commonly known as: KENALOG Apply 1 application topically 2 (two) times daily.   triamterene-hydrochlorothiazide 75-50 MG tablet Commonly known as: MAXZIDE Take 1 tablet by mouth daily.       Follow-up Information    [name], DO. Schedule an appointment as soon as possible for a visit in 1 week(s).   Specialty: Family Medicine Contact information: [address] [place] [address] [phone]              Allergies  Allergen Reactions  . Crestor [Rosuvastatin Calcium] Anaphylaxis  . Metformin And Related Diarrhea    Nausea and diarrhea  . Atorvastatin Other (See Comments)    Neck stiffness  . Codeine Itching    You were cared for by a hospitalist during your hospital stay. If you have any questions about your discharge medications or the care you received while you were in the hospital after you are discharged, you can call the unit  and asked to speak with the hospitalist on call if the hospitalist that took care of you is not available. Once you are discharged, your primary care physician will handle any further medical issues. Please note that no refills for any discharge medications will be authorized once you are discharged, as it is imperative that you return to your primary care physician (or establish a relationship with a primary care physician if you do not have one) for your aftercare needs so that they can reassess your need for medications and monitor your lab values.   Procedures/Studies: CT Chest W Contrast  Result Date: 04/06/2020 CLINICAL DATA:  Right-sided shoulder pain and chest pain. EXAM: CT CHEST, ABDOMEN, AND PELVIS WITH CONTRAST TECHNIQUE: Multidetector CT imaging of the chest, abdomen and pelvis was performed following the standard protocol during bolus administration of intravenous contrast. CONTRAST:  172mL  OMNIPAQUE IOHEXOL 300 MG/ML  SOLN COMPARISON:  May 02, 2013 FINDINGS: CT CHEST FINDINGS Cardiovascular: No significant vascular findings. Normal heart size. No pericardial effusion. Mediastinum/Nodes: No enlarged mediastinal, hilar, or axillary lymph nodes. Thyroid gland, trachea, and esophagus demonstrate no significant findings. Lungs/Pleura: Lungs are clear. No pleural effusion or pneumothorax. Musculoskeletal: Multilevel degenerative changes seen throughout the thoracic spine. CT ABDOMEN PELVIS FINDINGS Hepatobiliary: No focal liver abnormality is seen. Status post cholecystectomy. No biliary dilatation. Pancreas: Unremarkable. No pancreatic ductal dilatation or surrounding inflammatory changes. Spleen: Normal in size without focal abnormality. Adrenals/Urinary Tract: Adrenal glands are unremarkable. Kidneys are normal, without renal calculi, focal lesion, or hydronephrosis. Bladder is unremarkable. Stomach/Bowel: Stomach is within normal limits. The appendix is not identified. No evidence of bowel wall thickening, distention, or inflammatory changes. Vascular/Lymphatic: No significant vascular findings are present. No enlarged abdominal or pelvic lymph nodes. Reproductive: Status post hysterectomy. No adnexal masses. Other: There is a 1.9 cm x 2.9 cm fat containing umbilical hernia. Musculoskeletal: Multilevel degenerative changes seen throughout the lumbar spine. IMPRESSION: 1. No CT evidence of acute intrathoracic or intra-abdominal pathology. 2. 1.9 cm x 2.9 cm fat containing umbilical hernia. 3. Evidence of prior cholecystectomy. Electronically Signed   By: Virgina Norfolk M.D.   On: 04/06/2020 15:30   MR BRAIN WO CONTRAST  Result Date: 04/11/2020 CLINICAL DATA:  Syncope.  Abnormal neuro exam.  Severe dizziness. EXAM: MRI HEAD WITHOUT CONTRAST TECHNIQUE: Multiplanar, multiecho pulse sequences of the brain and surrounding structures were obtained without intravenous contrast. COMPARISON:  None.  FINDINGS: Brain: No acute infarction, hemorrhage, hydrocephalus, extra-axial collection or mass lesion. Several small white matter hyperintensities bilaterally. Vascular: Normal arterial flow voids Skull and upper cervical spine: No focal skeletal lesion. Sinuses/Orbits: Mild mucosal edema paranasal sinuses. Negative orbit. Other: None IMPRESSION: No acute abnormality.  Mild chronic white matter changes. Electronically Signed   By: Franchot Gallo M.D.   On: 04/11/2020 10:15   CT Abdomen Pelvis W Contrast  Result Date: 04/06/2020 CLINICAL DATA:  Right-sided shoulder pain and right sided chest pain. EXAM: CT CHEST, ABDOMEN, AND PELVIS WITH CONTRAST TECHNIQUE: Multidetector CT imaging of the chest, abdomen and pelvis was performed following the standard protocol during bolus administration of intravenous contrast. CONTRAST:  142mL OMNIPAQUE IOHEXOL 300 MG/ML  SOLN COMPARISON:  None. FINDINGS: CT CHEST FINDINGS Cardiovascular: No significant vascular findings. Normal heart size. No pericardial effusion. Mediastinum/Nodes: No enlarged mediastinal, hilar, or axillary lymph nodes. Thyroid gland, trachea, and esophagus demonstrate no significant findings. Lungs/Pleura: Lungs are clear. No pleural effusion or pneumothorax. Musculoskeletal: Multilevel degenerative changes seen throughout the thoracic spine. CT ABDOMEN PELVIS FINDINGS Hepatobiliary:  No focal liver abnormality is seen. Status post cholecystectomy. No biliary dilatation. Pancreas: Unremarkable. No pancreatic ductal dilatation or surrounding inflammatory changes. Spleen: Normal in size without focal abnormality. Adrenals/Urinary Tract: Adrenal glands are unremarkable. Kidneys are normal, without renal calculi, focal lesion, or hydronephrosis. Bladder is unremarkable. Stomach/Bowel: Stomach is within normal limits. The appendix is not identified. No evidence of bowel wall thickening, distention, or inflammatory changes. Vascular/Lymphatic: No significant  vascular findings are present. No enlarged abdominal or pelvic lymph nodes. Reproductive: Status post hysterectomy. No adnexal masses. Other: There is a 1.9 cm x 2.9 cm fat containing umbilical hernia. Musculoskeletal: Multilevel degenerative changes seen throughout the lumbar spine. IMPRESSION: 1. No CT evidence of acute intrathoracic or intra-abdominal pathology. 2. 1.9 cm x 2.9 cm fat containing umbilical hernia. 3. Evidence of prior cholecystectomy. Electronically Signed   By: Virgina Norfolk M.D.   On: 04/06/2020 15:30   MR SHOULDER RIGHT WO CONTRAST  Result Date: 04/08/2020 CLINICAL DATA:  Right shoulder and right scapular pain EXAM: MRI OF THE RIGHT SHOULDER WITHOUT CONTRAST TECHNIQUE: Multiplanar, multisequence MR imaging of the shoulder was performed. No intravenous contrast was administered. COMPARISON:  X-ray 09/04/2014 FINDINGS: Rotator cuff: Mild infraspinatus tendinosis with tiny rim rent tear of the mid insertional fibers (series 11, image 9). Supraspinatus and subscapularis tendons are intact with mild tendinosis. Intact teres minor. Muscles: No atrophy or abnormal signal of the muscles of the rotator cuff. Biceps long head: Intact. Mild fluid within the biceps tendon sheath. Acromioclavicular Joint: Mild-moderate arthropathy of the acromioclavicular joint. Trace subacromial/subdeltoid bursal edema without fluid collection. Glenohumeral Joint: No joint effusion. No chondral defect. Labrum: Grossly intact, but evaluation is limited by lack of intraarticular fluid. Bones:  No marrow abnormality, fracture or dislocation. Other: Infiltration of the anatomic fat within the rotator interval. No soft tissue edema or fluid collection. IMPRESSION: 1. No acute osseous abnormality of the right shoulder. 2. Mild rotator cuff tendinosis with tiny rim rent tear of the infraspinatus tendon. 3. Findings which can be seen in the clinical setting of adhesive capsulitis. 4. Mild-moderate arthropathy of the  acromioclavicular joint. Electronically Signed   By: Davina Poke D.O.   On: 04/08/2020 14:54   DG Chest Port 1 View  Result Date: 04/06/2020 CLINICAL DATA:  Shortness of breath. Additional provided: Patient reports pain starting in right shoulder which radiates under right breast and into chest, pain with breathing. EXAM: PORTABLE CHEST 1 VIEW COMPARISON:  Prior chest radiographs 05/13/2019 and earlier FINDINGS: Heart size within normal limits. There is no appreciable airspace consolidation. No frank pulmonary edema. No evidence of pleural effusion or pneumothorax. No acute bony abnormality identified. IMPRESSION: No evidence of acute cardiopulmonary abnormality. Electronically Signed   By: Kellie Simmering DO   On: 04/06/2020 13:20   DG FLUORO GUIDED NEEDLE PLC ASPIRATION/INJECTION LOC  Result Date: 04/09/2020 CLINICAL DATA:  Adhesive capsulitis EXAM: RIGHT SHOULDER INJECTION UNDER FLUOROSCOPY TECHNIQUE: An appropriate skin entrance site was determined. The site was marked, prepped with Betadine, draped in the usual sterile fashion, and infiltrated locally with buffered Lidocaine. 22 gauge spinal needle was advanced to the superomedial margin of the humeral head under intermittent fluoroscopy. 5 ml of Omnipaque 300 was then used to opacify the right shoulder capsule. Subsequently 3 mL of ropivacaine and 1.5 mL of Depo-Medrol were administered. No immediate complication. FLUOROSCOPY TIME:  Fluoroscopy Time:  18 seconds Radiation Exposure Index (if provided by the fluoroscopic device): 2.1 mGy FINDINGS: Injection of contrast after needle placement demonstrates intra-articular location.  IMPRESSION: Technically successful right shoulder steroid injection. Electronically Signed   By: Macy Mis M.D.   On: 04/09/2020 16:00      The results of significant diagnostics from this hospitalization (including imaging, microbiology, ancillary and laboratory) are listed below for reference.      Microbiology: Recent Results (from the past 240 hour(s))  Culture, blood (single)     Status: None   Collection Time: 04/06/20 12:23 PM   Specimen: BLOOD  Result Value Ref Range Status   Specimen Description   Final    BLOOD RIGHT ANTECUBITAL Performed at [facility], [place] [address], [place], [place] [address]    Special Requests   Final    BOTTLES DRAWN AEROBIC AND ANAEROBIC Blood Culture adequate volume Performed at [facility], [address] [place], [place] [address]    Culture   Final    NO GROWTH 5 DAYS Performed at [facility], [place] [address], [place], [place] [address]    Report Status 04/11/2020 FINAL  Final  Culture, blood (single)     Status: None   Collection Time: 04/06/20  1:55 PM   Specimen: BLOOD  Result Value Ref Range Status   Specimen Description   Final    BLOOD LEFT ANTECUBITAL Performed at [facility], [place] [address], [place], [place] [address]    Special Requests   Final    BOTTLES DRAWN AEROBIC AND ANAEROBIC Blood Culture results may not be optimal due to an inadequate volume of blood received in culture bottles Performed at [facility], [address] [place], [place] [address]    Culture   Final    NO GROWTH 5 DAYS Performed at [facility], [place] [address], [place], [place] [address]    Report Status 04/11/2020 FINAL  Final  Urine culture     Status: Abnormal   Collection Time: 04/06/20  1:55 PM   Specimen: In/Out Cath Urine  Result Value Ref Range Status   Specimen Description   Final    IN/OUT CATH URINE Performed at [facility], [address] [place],  [address]    Special Requests   Final    NONE Performed at [facility], [address] [place], [place] [address]    Culture (A)  Final    <10,000 COLONIES/mL MULTIPLE SPECIES PRESENT, SUGGEST RECOLLECTION   Report Status 04/07/2020 FINAL  Final  SARS Coronavirus 2 by RT PCR (hospital order,  performed in [facility] hospital lab) Nasopharyngeal Nasopharyngeal Swab     Status: None   Collection Time: 04/06/20  2:08 PM   Specimen: Nasopharyngeal Swab  Result Value Ref Range Status   SARS Coronavirus 2 NEGATIVE NEGATIVE Final    Comment: (NOTE) SARS-CoV-2 target nucleic acids are NOT DETECTED.  The SARS-CoV-2 RNA is generally detectable in upper and lower respiratory specimens during the acute phase of infection. The lowest concentration of SARS-CoV-2 viral copies this assay can detect is 250 copies / mL. A negative result does not preclude SARS-CoV-2 infection and should not be used as the sole basis for treatment or other patient management decisions.  A negative result may occur with improper specimen collection / handling, submission of specimen other than nasopharyngeal swab, presence of viral mutation(s) within the areas targeted by this assay, and inadequate number of viral copies (<250 copies / mL). A negative result must be combined with clinical observations, patient history,  and epidemiological information.  Fact Sheet for Patients:   StrictlyIdeas.no  Fact Sheet for Healthcare Providers: BankingDealers.co.za  This test is not yet approved or  cleared by the [place] FDA and has been authorized for detection and/or diagnosis of SARS-CoV-2 by FDA under an Emergency Use Authorization (EUA).  This EUA will remain in effect (meaning this test can be used) for the duration of the COVID-19 declaration under Section 564(b)(1) of the Act, 21 U.S.C. section 360bbb-3(b)(1), unless the authorization is terminated or revoked sooner.  Performed at [facility], [address] [place], [place] [address]      Labs: BNP (last 3 results) Recent Labs    05/14/19 0320 04/08/20 0845 04/11/20 0815  BNP 33.3 5.8 88.9   Basic Metabolic Panel: Recent Labs  Lab 04/08/20 0418 04/09/20 0357 04/10/20 0339  04/11/20 0815 04/12/20 0444  NA 138 140 139 140 140  K 3.2* 3.3* 3.7 3.4* 3.2*  CL 99 105 104 106 104  CO2 30 28 26 25 28   GLUCOSE 146* 99 226* 161* 129*  BUN 14 15 13 13 14   CREATININE 1.16* 1.07* 0.92 0.71 0.77  CALCIUM 8.7* 8.0* 8.4* 8.2* 8.3*  MG 2.0 2.1 1.5* 1.9 2.5*  PHOS 2.7  --   --   --   --    Liver Function Tests: Recent Labs  Lab 04/06/20 1216 04/07/20 0623 04/08/20 0418  AST 21 17 15   ALT 13 14 13   ALKPHOS 89 84 78  BILITOT 0.5 0.8 0.8  PROT 8.1 7.4 7.2  ALBUMIN 4.0 3.7 3.5   No results for input(s): LIPASE, AMYLASE in the last 168 hours. No results for input(s): AMMONIA in the last 168 hours. CBC: Recent Labs  Lab 04/06/20 1223 04/07/20 0623 04/10/20 0339 04/11/20 0815 04/12/20 0444  WBC 5.4 5.5 4.5 7.8 7.4  NEUTROABS 3.2 3.6  --   --   --   HGB 12.8 12.3 11.8* 11.1* 12.2  HCT 39.0 38.0 35.7* 33.8* 36.4  MCV 83.7 84.1 83.8 83.3 82.2  PLT 247 248 192 236 281   Cardiac Enzymes: No results for input(s): CKTOTAL, CKMB, CKMBINDEX, TROPONINI in the last 168 hours. BNP: Invalid input(s): POCBNP CBG: Recent Labs  Lab 04/11/20 0757 04/11/20 1135 04/11/20 1747 04/11/20 2115 04/12/20 0756  GLUCAP 161* 113* 161* 136* 136*   D-Dimer No results for input(s): DDIMER in the last 72 hours. Hgb A1c No results for input(s): HGBA1C in the last 72 hours. Lipid Profile No results for input(s): CHOL, HDL, LDLCALC, TRIG, CHOLHDL, LDLDIRECT in the last 72 hours. Thyroid function studies Recent Labs    04/09/20 1211  TSH 1.548   Anemia work up No results for input(s): VITAMINB12, FOLATE, FERRITIN, TIBC, IRON, RETICCTPCT in the last 72 hours. Urinalysis    Component Value Date/Time   COLORURINE YELLOW 04/06/2020 1355   APPEARANCEUR CLEAR 04/06/2020 1355   LABSPEC 1.015 04/06/2020 1355   PHURINE 7.0 04/06/2020 1355   GLUCOSEU NEGATIVE 04/06/2020 1355   HGBUR NEGATIVE 04/06/2020 1355   HGBUR negative 09/03/2007 1610   BILIRUBINUR NEGATIVE 04/06/2020  1355   BILIRUBINUR neg 05/09/2016 1324   KETONESUR NEGATIVE 04/06/2020 1355   PROTEINUR NEGATIVE 04/06/2020 1355   UROBILINOGEN 0.2 05/09/2016 1324   UROBILINOGEN 1.0 02/09/2014 1257   NITRITE NEGATIVE 04/06/2020 1355   LEUKOCYTESUR NEGATIVE 04/06/2020 1355   Sepsis Labs Invalid input(s): PROCALCITONIN,  WBC,  LACTICIDVEN Microbiology Recent Results (from the past 240 hour(s))  Culture, blood (single)  Status: None   Collection Time: 04/06/20 12:23 PM   Specimen: BLOOD  Result Value Ref Range Status   Specimen Description   Final    BLOOD RIGHT ANTECUBITAL Performed at [facility], [place] [address], [place], [place] [address]    Special Requests   Final    BOTTLES DRAWN AEROBIC AND ANAEROBIC Blood Culture adequate volume Performed at [facility], [address] [place], [place] [address]    Culture   Final    NO GROWTH 5 DAYS Performed at [facility], [place] [address] [place], [place] [address]    Report Status 04/11/2020 FINAL  Final  Culture, blood (single)     Status: None   Collection Time: 04/06/20  1:55 PM   Specimen: BLOOD  Result Value Ref Range Status   Specimen Description   Final    BLOOD LEFT ANTECUBITAL Performed at [facility], [place] [address] [place], [place] [address]    Special Requests   Final    BOTTLES DRAWN AEROBIC AND ANAEROBIC Blood Culture results may not be optimal due to an inadequate volume of blood received in culture bottles Performed at [facility], [address] [place], [place] [address]    Culture   Final    NO GROWTH 5 DAYS Performed at [facility], [place] [address], [place], [place] [address]    Report Status 04/11/2020 FINAL  Final  Urine culture     Status: Abnormal   Collection Time: 04/06/20  1:55 PM   Specimen: In/Out Cath Urine  Result Value Ref Range Status   Specimen Description   Final    IN/OUT CATH URINE Performed at [facility], [address]  [place], [place] [address]    Special Requests   Final    NONE Performed at [facility], [address] [place], [place] [address]    Culture (A)  Final    <10,000 COLONIES/mL MULTIPLE SPECIES PRESENT, SUGGEST RECOLLECTION   Report Status 04/07/2020 FINAL  Final  SARS Coronavirus 2 by RT PCR (hospital order, performed in [facility] hospital lab) Nasopharyngeal Nasopharyngeal Swab     Status: None   Collection Time: 04/06/20  2:08 PM   Specimen: Nasopharyngeal Swab  Result Value Ref Range Status   SARS Coronavirus 2 NEGATIVE NEGATIVE Final    Comment: (NOTE) SARS-CoV-2 target nucleic acids are NOT DETECTED.  The SARS-CoV-2 RNA is generally detectable in upper and lower respiratory specimens during the acute phase of infection. The lowest concentration of SARS-CoV-2 viral copies this assay can detect is 250 copies / mL. A negative result does not preclude SARS-CoV-2 infection and should not be used as the sole basis for treatment or other patient management decisions.  A negative result may occur with improper specimen collection / handling, submission of specimen other than nasopharyngeal swab, presence of viral mutation(s) within the areas targeted by this assay, and inadequate number of viral copies (<250 copies / mL). A negative result must be combined with clinical observations, patient history, and epidemiological information.  Fact Sheet for Patients:   StrictlyIdeas.no  Fact Sheet for Healthcare Providers: BankingDealers.co.za  This test is not yet approved or  cleared by the [place] FDA and has been authorized for detection and/or diagnosis of SARS-CoV-2 by FDA under an Emergency Use Authorization (EUA).  This EUA will remain in effect (meaning this test can be used) for the  duration of the COVID-19 declaration under Section 564(b)(1) of the Act, 21 U.S.C. section 360bbb-3(b)(1), unless the authorization is  terminated or revoked sooner.  Performed at [facility], [address] [place], [place] [address]      Time coordinating discharge:  I have spent 35 minutes face to face with the patient and on the ward discussing the patients care, assessment, plan and disposition with other care givers. >50% of the time was devoted counseling the patient about the risks and benefits of treatment/Discharge disposition and coordinating care.   SIGNED:   Damita Lack, MD  [facility] 04/12/2020, 11:40 AM   If 7PM-7AM, please contact night-coverage

## 2020-04-13 ENCOUNTER — Telehealth: Payer: Self-pay | Admitting: *Deleted

## 2020-04-13 NOTE — Telephone Encounter (Signed)
1st attempt. Unable to reach patient. LVM for pt to call office to schedule hospital follow up appointment.   

## 2020-04-14 ENCOUNTER — Encounter: Payer: Self-pay | Admitting: Family Medicine

## 2020-04-14 ENCOUNTER — Ambulatory Visit (INDEPENDENT_AMBULATORY_CARE_PROVIDER_SITE_OTHER): Payer: Medicare HMO | Admitting: Family Medicine

## 2020-04-14 ENCOUNTER — Other Ambulatory Visit: Payer: Medicare HMO

## 2020-04-14 ENCOUNTER — Other Ambulatory Visit: Payer: Self-pay

## 2020-04-14 VITALS — BP 134/98 | HR 103 | Temp 97.6°F | Resp 18 | Ht 62.0 in

## 2020-04-14 DIAGNOSIS — R110 Nausea: Secondary | ICD-10-CM

## 2020-04-14 DIAGNOSIS — E860 Dehydration: Secondary | ICD-10-CM | POA: Diagnosis not present

## 2020-04-14 DIAGNOSIS — R7989 Other specified abnormal findings of blood chemistry: Secondary | ICD-10-CM | POA: Insufficient documentation

## 2020-04-14 DIAGNOSIS — R6510 Systemic inflammatory response syndrome (SIRS) of non-infectious origin without acute organ dysfunction: Secondary | ICD-10-CM | POA: Diagnosis not present

## 2020-04-14 DIAGNOSIS — I10 Essential (primary) hypertension: Secondary | ICD-10-CM

## 2020-04-14 DIAGNOSIS — E1165 Type 2 diabetes mellitus with hyperglycemia: Secondary | ICD-10-CM | POA: Diagnosis not present

## 2020-04-14 DIAGNOSIS — E1151 Type 2 diabetes mellitus with diabetic peripheral angiopathy without gangrene: Secondary | ICD-10-CM | POA: Diagnosis not present

## 2020-04-14 DIAGNOSIS — IMO0002 Reserved for concepts with insufficient information to code with codable children: Secondary | ICD-10-CM

## 2020-04-14 MED ORDER — ONDANSETRON 4 MG PO TBDP: 4.0000 mg | ORAL_TABLET | Freq: Three times a day (TID) | ORAL | 0 refills | Status: DC | PRN

## 2020-04-14 MED ORDER — PROMETHAZINE HCL 25 MG/ML IJ SOLN
25.0000 mg | Freq: Once | INTRAMUSCULAR | Status: AC
  Administered 2020-04-14: 25 mg via INTRAMUSCULAR

## 2020-04-14 NOTE — Patient Instructions (Signed)
Sepsis, Diagnosis, Adult Sepsis is a serious bodily reaction to an infection. The infection that triggers sepsis may be from a bacteria, virus, or fungus. Sepsis can result from an infection in any part of your body. Infections that commonly lead to sepsis include skin, lung, and urinary tract infections. Sepsis is a medical emergency that must be treated right away in a hospital. In severe cases, it can lead to septic shock. Septic shock can weaken your heart and cause your blood pressure to drop. This can cause your central nervous system and your body's organs to stop working. What are the causes? This condition is caused by a severe reaction to infections from bacteria, viruses, or fungus. The germs that most often lead to sepsis include:  Escherichia coli (E. coli) bacteria.  Staphylococcus aureus (staph) bacteria.  Some types of Streptococcus bacteria. The most common infections affect these organs:  The lung (pneumonia).  The kidneys or bladder (urinary tract infection).  The skin (cellulitis).  The bowel, gallbladder, or pancreas. What increases the risk? You are more likely to develop this condition if:  Your body's disease-fighting system (immune system) is weakened.  You are age 65 or older.  You are female.  You had surgery or you have been hospitalized.  You have these devices inserted into your body: ? A small, thin tube (catheter). ? IV line. ? Breathing tube. ? Drainage tube.  You are not getting enough nutrients from food (malnourished).  You have a long-term (chronic) disease, such as cancer, lung disease, kidney disease, or diabetes.  You are African American. What are the signs or symptoms? Symptoms of this condition may include:  Fever.  Chills or feeling very cold.  Confusion or anxiety.  Fatigue.  Muscle aches.  Shortness of breath.  Nausea and vomiting.  Urinating much less than usual.  Fast heart rate (tachycardia).  Rapid  breathing (hyperventilation).  Changes in skin color. Your skin may look blotchy, pale, or blue.  Cool, clammy, or sweaty skin.  Skin rash. Other symptoms depend on the source of your infection. How is this diagnosed? This condition is diagnosed based on:  Your symptoms.  Your medical history.  A physical exam. Other tests may also be done to find out the cause of the infection and how severe the sepsis is. These tests may include:  Blood tests.  Urine tests.  Swabs from other areas of your body that may have an infection. These samples may be tested (cultured) to find out what type of bacteria is causing the infection.  Chest X-ray to check for pneumonia. Other imaging tests, such as a CT scan, may also be done.  Lumbar puncture. This removes a small amount of the fluid that surrounds your brain and spinal cord. The fluid is then examined for infection. How is this treated? This condition must be treated in a hospital. Based on the cause of your infection, you may be given an antibiotic, antiviral, or antifungal medicine. You may also receive:  Fluids through an IV.  Oxygen and breathing assistance.  Medicines to increase your blood pressure.  Kidney dialysis. This process cleans your blood if your kidneys have failed.  Surgery to remove infected tissue.  Blood transfusion if needed.  Medicine to prevent blood clots.  Nutrients to correct imbalances in basic body function (metabolism). You may: ? Receive important salts and minerals (electrolytes) through an IV. ? Have your blood sugar level adjusted. Follow these instructions at home: Medicines   Take over-the-counter and   prescription medicines only as told by your health care provider.  If you were prescribed an antibiotic, antiviral, or antifungal medicine, take it as told by your health care provider. Do not stop taking the medicine even if you start to feel better. General instructions  If you have a  catheter or other indwelling device, ask to have it removed as soon as possible.  Keep all follow-up visits as told by your health care provider. This is important. Contact a health care provider if:  You do not feel like you are getting better or regaining strength.  You are having trouble coping with your recovery.  You frequently feel tired.  You feel worse or do not seem to get better after surgery.  You think you may have an infection after surgery. Get help right away if:  You have any symptoms of sepsis.  You have difficulty breathing.  You have a rapid or skipping heartbeat.  You become confused or disoriented.  You have a high fever.  Your skin becomes blotchy, pale, or blue.  You have an infection that is getting worse or not getting better. These symptoms may represent a serious problem that is an emergency. Do not wait to see if the symptoms will go away. Get medical help right away. Call your local emergency services (911 in the [place] Do not drive yourself to the hospital. Summary  Sepsis is a medical emergency that requires immediate treatment in a hospital.  This condition is caused by a severe reaction to infections from bacteria, viruses, or fungus.  Based on the cause of your infection, you may be given an antibiotic, antiviral, or antifungal medicine.  Treatment may also include IV fluids, breathing assistance, and kidney dialysis. This information is not intended to replace advice given to you by your health care provider. Make sure you discuss any questions you have with your health care provider. Document Revised: 05/11/2018 Document Reviewed: 05/11/2018 Elsevier Patient Education  2020 Elsevier Inc.  

## 2020-04-14 NOTE — Assessment & Plan Note (Signed)
Pt given cortisone injection in her shoulder in hospital ---  Concerns for adrenal insufficiency Will see endocrine tomorrow

## 2020-04-14 NOTE — Telephone Encounter (Signed)
I have made two attempts and have been unable to reach patient. Pt has hospital follow up scheduled w/ PCP today at 340pm

## 2020-04-14 NOTE — Assessment & Plan Note (Signed)
F/u endo  App tomorrow

## 2020-04-14 NOTE — Assessment & Plan Note (Signed)
Repeat labs today Pt still feeling weak and nauseous Phenergan 25 mg im zofran sent to pharmacy If nausea /vomiting does not improve ---- go to ER

## 2020-04-14 NOTE — Progress Notes (Signed)
Patient ID: Heather Walker, female    DOB: 29-Nov-1960  Age: 59 y.o. MRN: [id number]    Subjective:  Subjective  HPI Heather Walker presents for  hosp f/u for sirs, dehydration and shoulder pain.  She was admitted 6/21-6/27-- she presented to er with chills and riggers with R shoulder pain--- Wed she was given a cortisone shot in r shoulder --- she had abnormal cortisol level after that and adrenal insufficiency was suspected.  Acth stim test unable to be done due to problem in lab and pt wanted to be d/c.  She was d/c with instructions to have test done asap.   Pt is still feeling weak and nauseous   Review of Systems  Constitutional: Negative for appetite change, diaphoresis, fatigue and unexpected weight change.  Eyes: Negative for pain, redness and visual disturbance.  Respiratory: Negative for cough, chest tightness, shortness of breath and wheezing.   Cardiovascular: Negative for chest pain, palpitations and leg swelling.  Gastrointestinal: Positive for nausea. Negative for diarrhea.  Endocrine: Negative for cold intolerance, heat intolerance, polydipsia, polyphagia and polyuria.  Genitourinary: Negative for difficulty urinating, dysuria and frequency.  Neurological: Negative for dizziness, light-headedness, numbness and headaches.    History Past Medical History:  Diagnosis Date  . Arthritis   . Asthma   . Depression   . Diabetes mellitus without complication ([place])   . Heart murmur   . Hypertension     She has a past surgical history that includes Abdominal hysterectomy; Bladder suspension; Appendectomy; Cesarean section; Tubal ligation; and TEE without cardioversion (N/A, 08/24/2017).   Her family history includes Aneurysm in her sister; Asthma in her mother, sister, sister, and sister; Diabetes in her sister; Heart defect in her sister; Hypertension in her maternal grandmother, mother, and sister.She reports that she has never smoked. She has never used smokeless tobacco. She  reports that she does not drink alcohol and does not use drugs.  Current Outpatient Medications on File Prior to Visit  Medication Sig Dispense Refill  . amLODipine (NORVASC) 10 MG tablet Take 1 tablet (10 mg total) by mouth daily. 30 tablet 0  . glucose blood (ONETOUCH VERIO) test strip 1 each by Other route 2 (two) times daily. Use as instructed 100 each 6  . insulin isophane & regular human (NOVOLIN 70/30 FLEXPEN) (70-30) 100 UNIT/ML KwikPen Inject 22 Units into the skin 2 (two) times daily with a meal. 15 mL 6  . Insulin Pen Needle 32G X 4 MM MISC Inject 1 Device into the skin in the morning and at bedtime. 2x daily 100 each 6  . Lancets (ONETOUCH ULTRASOFT) lancets Use as instructed to test blood sugar 2 times daily E11.65 100 each 12  . lisinopril (ZESTRIL) 20 MG tablet Take 1 tablet (20 mg total) by mouth daily. 30 tablet 0  . OneTouch Delica Lancets 40J MISC 1 Package by Does not apply route in the morning and at bedtime. Use as instructed 2 times daily E11.65 100 each 6  . Semaglutide,0.25 or 0.5MG /DOS, (OZEMPIC, 0.25 OR 0.5 MG/DOSE,) 2 MG/1.5ML SOPN Inject 0.5 mg into the skin once a week. 1 pen 6  . traZODone (DESYREL) 50 MG tablet TAKE 1/2 TO 1 TABLET(25 TO 50 MG) BY MOUTH AT BEDTIME AS NEEDED FOR SLEEP (Patient taking differently: Take 50-100 mg by mouth at bedtime as needed for sleep. ) 30 tablet 0  . triamcinolone cream (KENALOG) 0.1 % Apply 1 application topically 2 (two) times daily. 30 g 0  . triamterene-hydrochlorothiazide (  MAXZIDE) 75-50 MG tablet Take 1 tablet by mouth daily. 90 tablet 1   No current facility-administered medications on file prior to visit.     Objective:  Objective  Physical Exam Vitals and nursing note reviewed.  Constitutional:      Appearance: She is well-developed.  HENT:     Head: Normocephalic and atraumatic.  Eyes:     Conjunctiva/sclera: Conjunctivae normal.  Neck:     Thyroid: No thyromegaly.     Vascular: No carotid bruit or JVD.    Cardiovascular:     Rate and Rhythm: Normal rate and regular rhythm.     Heart sounds: Normal heart sounds. No murmur heard.   Pulmonary:     Effort: Pulmonary effort is normal. No respiratory distress.     Breath sounds: Normal breath sounds. No wheezing or rales.  Chest:     Chest wall: No tenderness.  Musculoskeletal:     Cervical back: Normal range of motion and neck supple.  Neurological:     Mental Status: She is alert and oriented to person, place, and time.    BP (!) 134/98 (BP Location: Left Arm, Patient Position: Sitting, Cuff Size: Large)   Pulse (!) 103   Temp 97.6 F (36.4 C) (Temporal)   Resp 18   Ht 5\' 2"  (1.575 m)   SpO2 98%   BMI 36.21 kg/m  Wt Readings from Last 3 Encounters:  04/06/20 198 lb (89.8 kg)  03/03/20 201 lb 6.4 oz (91.4 kg)  03/03/20 202 lb (91.6 kg)     Lab Results  Component Value Date   WBC 7.4 04/12/2020   HGB 12.2 04/12/2020   HCT 36.4 04/12/2020   PLT 281 04/12/2020   GLUCOSE 129 (H) 04/12/2020   CHOL 173 02/20/2019   TRIG 172.0 (H) 02/20/2019   HDL 51.90 02/20/2019   LDLDIRECT 115.0 08/02/2018   LDLCALC 87 02/20/2019   ALT 13 04/08/2020   AST 15 04/08/2020   NA 140 04/12/2020   K 3.2 (L) 04/12/2020   CL 104 04/12/2020   CREATININE 0.77 04/12/2020   BUN 14 04/12/2020   CO2 28 04/12/2020   TSH 1.548 04/09/2020   INR 1.0 04/06/2020   HGBA1C 8.7 (A) 03/03/2020   MICROALBUR <0.7 12/04/2019    CT Chest W Contrast  Result Date: 04/06/2020 CLINICAL DATA:  Right-sided shoulder pain and chest pain. EXAM: CT CHEST, ABDOMEN, AND PELVIS WITH CONTRAST TECHNIQUE: Multidetector CT imaging of the chest, abdomen and pelvis was performed following the standard protocol during bolus administration of intravenous contrast. CONTRAST:  180mL OMNIPAQUE IOHEXOL 300 MG/ML  SOLN COMPARISON:  May 02, 2013 FINDINGS: CT CHEST FINDINGS Cardiovascular: No significant vascular findings. Normal heart size. No pericardial effusion. Mediastinum/Nodes: No  enlarged mediastinal, hilar, or axillary lymph nodes. Thyroid gland, trachea, and esophagus demonstrate no significant findings. Lungs/Pleura: Lungs are clear. No pleural effusion or pneumothorax. Musculoskeletal: Multilevel degenerative changes seen throughout the thoracic spine. CT ABDOMEN PELVIS FINDINGS Hepatobiliary: No focal liver abnormality is seen. Status post cholecystectomy. No biliary dilatation. Pancreas: Unremarkable. No pancreatic ductal dilatation or surrounding inflammatory changes. Spleen: Normal in size without focal abnormality. Adrenals/Urinary Tract: Adrenal glands are unremarkable. Kidneys are normal, without renal calculi, focal lesion, or hydronephrosis. Bladder is unremarkable. Stomach/Bowel: Stomach is within normal limits. The appendix is not identified. No evidence of bowel wall thickening, distention, or inflammatory changes. Vascular/Lymphatic: No significant vascular findings are present. No enlarged abdominal or pelvic lymph nodes. Reproductive: Status post hysterectomy. No adnexal masses. Other: There  is a 1.9 cm x 2.9 cm fat containing umbilical hernia. Musculoskeletal: Multilevel degenerative changes seen throughout the lumbar spine. IMPRESSION: 1. No CT evidence of acute intrathoracic or intra-abdominal pathology. 2. 1.9 cm x 2.9 cm fat containing umbilical hernia. 3. Evidence of prior cholecystectomy. Electronically Signed   By: Virgina Norfolk M.D.   On: 04/06/2020 15:30   CT Abdomen Pelvis W Contrast  Result Date: 04/06/2020 CLINICAL DATA:  Right-sided shoulder pain and right sided chest pain. EXAM: CT CHEST, ABDOMEN, AND PELVIS WITH CONTRAST TECHNIQUE: Multidetector CT imaging of the chest, abdomen and pelvis was performed following the standard protocol during bolus administration of intravenous contrast. CONTRAST:  164mL OMNIPAQUE IOHEXOL 300 MG/ML  SOLN COMPARISON:  None. FINDINGS: CT CHEST FINDINGS Cardiovascular: No significant vascular findings. Normal heart size.  No pericardial effusion. Mediastinum/Nodes: No enlarged mediastinal, hilar, or axillary lymph nodes. Thyroid gland, trachea, and esophagus demonstrate no significant findings. Lungs/Pleura: Lungs are clear. No pleural effusion or pneumothorax. Musculoskeletal: Multilevel degenerative changes seen throughout the thoracic spine. CT ABDOMEN PELVIS FINDINGS Hepatobiliary: No focal liver abnormality is seen. Status post cholecystectomy. No biliary dilatation. Pancreas: Unremarkable. No pancreatic ductal dilatation or surrounding inflammatory changes. Spleen: Normal in size without focal abnormality. Adrenals/Urinary Tract: Adrenal glands are unremarkable. Kidneys are normal, without renal calculi, focal lesion, or hydronephrosis. Bladder is unremarkable. Stomach/Bowel: Stomach is within normal limits. The appendix is not identified. No evidence of bowel wall thickening, distention, or inflammatory changes. Vascular/Lymphatic: No significant vascular findings are present. No enlarged abdominal or pelvic lymph nodes. Reproductive: Status post hysterectomy. No adnexal masses. Other: There is a 1.9 cm x 2.9 cm fat containing umbilical hernia. Musculoskeletal: Multilevel degenerative changes seen throughout the lumbar spine. IMPRESSION: 1. No CT evidence of acute intrathoracic or intra-abdominal pathology. 2. 1.9 cm x 2.9 cm fat containing umbilical hernia. 3. Evidence of prior cholecystectomy. Electronically Signed   By: Virgina Norfolk M.D.   On: 04/06/2020 15:30   DG Chest Port 1 View  Result Date: 04/06/2020 CLINICAL DATA:  Shortness of breath. Additional provided: Patient reports pain starting in right shoulder which radiates under right breast and into chest, pain with breathing. EXAM: PORTABLE CHEST 1 VIEW COMPARISON:  Prior chest radiographs 05/13/2019 and earlier FINDINGS: Heart size within normal limits. There is no appreciable airspace consolidation. No frank pulmonary edema. No evidence of pleural effusion  or pneumothorax. No acute bony abnormality identified. IMPRESSION: No evidence of acute cardiopulmonary abnormality. Electronically Signed   By: Kellie Simmering DO   On: 04/06/2020 13:20     Assessment & Plan:  Plan  I am having Connye Burkitt. Radke start on ondansetron. I am also having her maintain her OneTouch Verio, Insulin Pen Needle, onetouch ultrasoft, traZODone, triamterene-hydrochlorothiazide, OneTouch Delica Lancets 75I, Ozempic (0.25 or 0.5 MG/DOSE), NovoLIN 70/30 FlexPen, triamcinolone cream, amLODipine, and lisinopril. We administered promethazine.  Meds ordered this encounter  Medications  . ondansetron (ZOFRAN ODT) 4 MG disintegrating tablet    Sig: Take 1 tablet (4 mg total) by mouth every 8 (eight) hours as needed for nausea or vomiting.    Dispense:  20 tablet    Refill:  0  . promethazine (PHENERGAN) injection 25 mg    Problem List Items Addressed This Visit      Unprioritized   Abnormal cortisol level    Pt given cortisone injection in her shoulder in hospital ---  Concerns for adrenal insufficiency Will see endocrine tomorrow       DM (diabetes mellitus) type II uncontrolled,  periph vascular disorder ([place])    F/u endo  App tomorrow      Essential hypertension (Chronic)    norvasc just increased to 10 mg  Lisinopril added in hosp as well F/u 3 month or sooner prn        SIRS (systemic inflammatory response syndrome) ([facility])    Repeat labs today Pt still feeling weak and nauseous Phenergan 25 mg im zofran sent to pharmacy If nausea /vomiting does not improve ---- go to ER      Relevant Orders   CBC with Differential/Platelet   Comprehensive metabolic panel    Other Visit Diagnoses    Dehydration    -  Primary   Relevant Orders   CBC with Differential/Platelet   Comprehensive metabolic panel   Nausea       Relevant Medications   ondansetron (ZOFRAN ODT) 4 MG disintegrating tablet   promethazine (PHENERGAN) injection 25 mg (Completed)    if nausea/  vomiting persist even after injection and zofran--- go to ER  Follow-up: Return in about 3 months (around 07/15/2020), or if symptoms worsen or fail to improve.  [name], DO

## 2020-04-14 NOTE — Assessment & Plan Note (Signed)
norvasc just increased to 10 mg  Lisinopril added in hosp as well F/u 3 month or sooner prn

## 2020-04-15 ENCOUNTER — Observation Stay
Admission: EM | Admit: 2020-04-15 | Discharge: 2020-04-16 | Disposition: A | Discharge status: Home / Self Care | Payer: Medicare HMO | Attending: Internal Medicine | Admitting: Internal Medicine

## 2020-04-15 ENCOUNTER — Telehealth: Payer: Self-pay | Admitting: Family Medicine

## 2020-04-15 ENCOUNTER — Encounter: Payer: Self-pay | Admitting: Internal Medicine

## 2020-04-15 ENCOUNTER — Emergency Department: Payer: Medicare HMO

## 2020-04-15 ENCOUNTER — Telehealth: Payer: Self-pay

## 2020-04-15 ENCOUNTER — Other Ambulatory Visit: Payer: Self-pay

## 2020-04-15 ENCOUNTER — Encounter: Payer: Self-pay | Admitting: Emergency Medicine

## 2020-04-15 ENCOUNTER — Ambulatory Visit (INDEPENDENT_AMBULATORY_CARE_PROVIDER_SITE_OTHER): Payer: Medicare HMO | Admitting: Internal Medicine

## 2020-04-15 VITALS — BP 132/88 | HR 103 | Ht 63.0 in

## 2020-04-15 DIAGNOSIS — K219 Gastro-esophageal reflux disease without esophagitis: Secondary | ICD-10-CM | POA: Insufficient documentation

## 2020-04-15 DIAGNOSIS — Z888 Allergy status to other drugs, medicaments and biological substances status: Secondary | ICD-10-CM | POA: Diagnosis not present

## 2020-04-15 DIAGNOSIS — I10 Essential (primary) hypertension: Secondary | ICD-10-CM

## 2020-04-15 DIAGNOSIS — E876 Hypokalemia: Secondary | ICD-10-CM

## 2020-04-15 DIAGNOSIS — R42 Dizziness and giddiness: Secondary | ICD-10-CM

## 2020-04-15 DIAGNOSIS — Z9049 Acquired absence of other specified parts of digestive tract: Secondary | ICD-10-CM | POA: Diagnosis not present

## 2020-04-15 DIAGNOSIS — Z794 Long term (current) use of insulin: Secondary | ICD-10-CM | POA: Diagnosis not present

## 2020-04-15 DIAGNOSIS — Z8249 Family history of ischemic heart disease and other diseases of the circulatory system: Secondary | ICD-10-CM | POA: Diagnosis not present

## 2020-04-15 DIAGNOSIS — R011 Cardiac murmur, unspecified: Secondary | ICD-10-CM | POA: Insufficient documentation

## 2020-04-15 DIAGNOSIS — R0789 Other chest pain: Secondary | ICD-10-CM | POA: Diagnosis not present

## 2020-04-15 DIAGNOSIS — F339 Major depressive disorder, recurrent, unspecified: Secondary | ICD-10-CM | POA: Insufficient documentation

## 2020-04-15 DIAGNOSIS — R6889 Other general symptoms and signs: Secondary | ICD-10-CM | POA: Diagnosis not present

## 2020-04-15 DIAGNOSIS — E1151 Type 2 diabetes mellitus with diabetic peripheral angiopathy without gangrene: Secondary | ICD-10-CM | POA: Diagnosis not present

## 2020-04-15 DIAGNOSIS — J45909 Unspecified asthma, uncomplicated: Secondary | ICD-10-CM | POA: Diagnosis not present

## 2020-04-15 DIAGNOSIS — R079 Chest pain, unspecified: Secondary | ICD-10-CM | POA: Diagnosis not present

## 2020-04-15 DIAGNOSIS — Z825 Family history of asthma and other chronic lower respiratory diseases: Secondary | ICD-10-CM | POA: Insufficient documentation

## 2020-04-15 DIAGNOSIS — E669 Obesity, unspecified: Secondary | ICD-10-CM | POA: Diagnosis not present

## 2020-04-15 DIAGNOSIS — E119 Type 2 diabetes mellitus without complications: Secondary | ICD-10-CM | POA: Diagnosis not present

## 2020-04-15 DIAGNOSIS — E1165 Type 2 diabetes mellitus with hyperglycemia: Secondary | ICD-10-CM

## 2020-04-15 DIAGNOSIS — E860 Dehydration: Secondary | ICD-10-CM | POA: Insufficient documentation

## 2020-04-15 DIAGNOSIS — Z90710 Acquired absence of both cervix and uterus: Secondary | ICD-10-CM | POA: Diagnosis not present

## 2020-04-15 DIAGNOSIS — R000 Tachycardia, unspecified: Secondary | ICD-10-CM | POA: Diagnosis not present

## 2020-04-15 DIAGNOSIS — Z6835 Body mass index (BMI) 35.0-35.9, adult: Secondary | ICD-10-CM | POA: Diagnosis not present

## 2020-04-15 DIAGNOSIS — R7989 Other specified abnormal findings of blood chemistry: Secondary | ICD-10-CM | POA: Diagnosis not present

## 2020-04-15 DIAGNOSIS — R531 Weakness: Secondary | ICD-10-CM

## 2020-04-15 DIAGNOSIS — IMO0002 Reserved for concepts with insufficient information to code with codable children: Secondary | ICD-10-CM

## 2020-04-15 DIAGNOSIS — Z833 Family history of diabetes mellitus: Secondary | ICD-10-CM | POA: Insufficient documentation

## 2020-04-15 DIAGNOSIS — N179 Acute kidney failure, unspecified: Secondary | ICD-10-CM

## 2020-04-15 DIAGNOSIS — Z885 Allergy status to narcotic agent status: Secondary | ICD-10-CM | POA: Diagnosis not present

## 2020-04-15 DIAGNOSIS — Z8616 Personal history of COVID-19: Secondary | ICD-10-CM | POA: Diagnosis not present

## 2020-04-15 DIAGNOSIS — R9431 Abnormal electrocardiogram [ECG] [EKG]: Secondary | ICD-10-CM | POA: Insufficient documentation

## 2020-04-15 DIAGNOSIS — Z743 Need for continuous supervision: Secondary | ICD-10-CM | POA: Diagnosis not present

## 2020-04-15 DIAGNOSIS — Z7982 Long term (current) use of aspirin: Secondary | ICD-10-CM | POA: Diagnosis not present

## 2020-04-15 DIAGNOSIS — R404 Transient alteration of awareness: Secondary | ICD-10-CM | POA: Diagnosis not present

## 2020-04-15 DIAGNOSIS — M1990 Unspecified osteoarthritis, unspecified site: Secondary | ICD-10-CM | POA: Insufficient documentation

## 2020-04-15 DIAGNOSIS — Z20822 Contact with and (suspected) exposure to covid-19: Secondary | ICD-10-CM | POA: Diagnosis not present

## 2020-04-15 DIAGNOSIS — Z79899 Other long term (current) drug therapy: Secondary | ICD-10-CM | POA: Insufficient documentation

## 2020-04-15 LAB — COMPREHENSIVE METABOLIC PANEL
ALT: 32 U/L (ref 0–35)
AST: 23 U/L (ref 0–37)
Albumin: 4.2 g/dL (ref 3.5–5.2)
Alkaline Phosphatase: 92 U/L (ref 39–117)
BUN: 18 mg/dL (ref 6–23)
CO2: 29 mEq/L (ref 19–32)
Calcium: 9.9 mg/dL (ref 8.4–10.5)
Chloride: 101 mEq/L (ref 96–112)
Creatinine, Ser: 1.03 mg/dL (ref 0.40–1.20)
GFR: 66.72 mL/min (ref >60.00)
Glucose, Bld: 167 mg/dL — ABNORMAL HIGH (ref 70–99)
Potassium: 3.5 mEq/L (ref 3.5–5.1)
Sodium: 141 mEq/L (ref 135–145)
Total Bilirubin: 0.4 mg/dL (ref 0.2–1.2)
Total Protein: 8.4 g/dL — ABNORMAL HIGH (ref 6.0–8.3)

## 2020-04-15 LAB — CBC WITH DIFFERENTIAL/PLATELET
Basophils Absolute: 0 10*3/uL (ref 0.0–0.1)
Basophils Relative: 0.7 % (ref 0.0–3.0)
Eosinophils Absolute: 0.1 10*3/uL (ref 0.0–0.7)
Eosinophils Relative: 1 % (ref 0.0–5.0)
HCT: 44.2 % (ref 36.0–46.0)
Hemoglobin: 14.6 g/dL (ref 12.0–15.0)
Lymphocytes Relative: 35.8 % (ref 12.0–46.0)
Lymphs Abs: 2.6 10*3/uL (ref 0.7–4.0)
MCHC: 33.1 g/dL (ref 30.0–36.0)
MCV: 84.2 fl (ref 78.0–100.0)
Monocytes Absolute: 0.8 10*3/uL (ref 0.1–1.0)
Monocytes Relative: 11.7 % (ref 3.0–12.0)
Neutro Abs: 3.7 10*3/uL (ref 1.4–7.7)
Neutrophils Relative %: 50.8 % (ref 43.0–77.0)
Platelets: 369 10*3/uL (ref 150.0–400.0)
RBC: 5.25 Mil/uL — ABNORMAL HIGH (ref 3.87–5.11)
RDW: 15.7 % — ABNORMAL HIGH (ref 11.5–15.5)
WBC: 7.2 10*3/uL (ref 4.0–10.5)

## 2020-04-15 LAB — CBC
HCT: 41.5 % (ref 36.0–46.0)
Hemoglobin: 14.1 g/dL (ref 12.0–15.0)
MCH: 27.9 pg (ref 26.0–34.0)
MCHC: 34 g/dL (ref 30.0–36.0)
MCV: 82.2 fL (ref 80.0–100.0)
Platelets: 345 10*3/uL (ref 150–400)
RBC: 5.05 MIL/uL (ref 3.87–5.11)
RDW: 14.7 % (ref 11.5–15.5)
WBC: 7.4 10*3/uL (ref 4.0–10.5)
nRBC: 0 % (ref 0.0–0.2)

## 2020-04-15 LAB — BASIC METABOLIC PANEL
Anion gap: 9 (ref 5–15)
BUN: 17 mg/dL (ref 6–20)
CO2: 31 mmol/L (ref 22–32)
Calcium: 9.1 mg/dL (ref 8.9–10.3)
Chloride: 104 mmol/L (ref 98–111)
Creatinine, Ser: 1.2 mg/dL — ABNORMAL HIGH (ref 0.44–1.00)
GFR calc Af Amer: 58 mL/min — ABNORMAL LOW (ref >60)
GFR calc non Af Amer: 50 mL/min — ABNORMAL LOW (ref >60)
Glucose, Bld: 150 mg/dL — ABNORMAL HIGH (ref 70–99)
Potassium: 3.3 mmol/L — ABNORMAL LOW (ref 3.5–5.1)
Sodium: 144 mmol/L (ref 135–145)

## 2020-04-15 LAB — GLUCOSE, CAPILLARY: Glucose-Capillary: 129 mg/dL — ABNORMAL HIGH (ref 70–99)

## 2020-04-15 LAB — APTT: aPTT: 25 seconds (ref 24–36)

## 2020-04-15 LAB — CORTISOL: Cortisol, Plasma: 7 ug/dL

## 2020-04-15 LAB — PROTIME-INR
INR: 1 (ref 0.8–1.2)
Prothrombin Time: 12.7 seconds (ref 11.4–15.2)

## 2020-04-15 LAB — TROPONIN I (HIGH SENSITIVITY)
Troponin I (High Sensitivity): 6 ng/L (ref <18)
Troponin I (High Sensitivity): 6 ng/L (ref <18)

## 2020-04-15 LAB — GLUCOSE, POCT (MANUAL RESULT ENTRY): POC Glucose: 190 mg/dl — AB (ref 70–99)

## 2020-04-15 MED ORDER — ASPIRIN EC 81 MG PO TBEC
81.0000 mg | DELAYED_RELEASE_TABLET | Freq: Every day | ORAL | Status: DC
  Administered 2020-04-16: 81 mg via ORAL
  Filled 2020-04-15 (×2): qty 1

## 2020-04-15 MED ORDER — NITROGLYCERIN 0.4 MG SL SUBL: 0.4000 mg | SUBLINGUAL_TABLET | SUBLINGUAL | Status: DC | PRN

## 2020-04-15 MED ORDER — SODIUM CHLORIDE 0.9 % IV SOLN: INTRAVENOUS | Status: DC

## 2020-04-15 MED ORDER — INSULIN ASPART 100 UNIT/ML ~~LOC~~ SOLN
0.0000 [IU] | Freq: Three times a day (TID) | SUBCUTANEOUS | Status: DC
  Administered 2020-04-16: 3 [IU] via SUBCUTANEOUS
  Administered 2020-04-16: 2 [IU] via SUBCUTANEOUS
  Filled 2020-04-15 (×2): qty 1

## 2020-04-15 MED ORDER — ACETAMINOPHEN 325 MG PO TABS: 650.0000 mg | ORAL_TABLET | ORAL | Status: DC | PRN

## 2020-04-15 MED ORDER — HEPARIN (PORCINE) 25000 UT/250ML-% IV SOLN
1000.0000 [IU]/h | INTRAVENOUS | Status: DC
  Administered 2020-04-15: 1000 [IU]/h via INTRAVENOUS
  Filled 2020-04-15: qty 250

## 2020-04-15 MED ORDER — AMLODIPINE BESYLATE 5 MG PO TABS
10.0000 mg | ORAL_TABLET | Freq: Every day | ORAL | Status: DC
  Administered 2020-04-16: 10 mg via ORAL
  Filled 2020-04-15: qty 2

## 2020-04-15 MED ORDER — POTASSIUM CHLORIDE 20 MEQ PO PACK
40.0000 meq | PACK | Freq: Once | ORAL | Status: DC
  Filled 2020-04-15: qty 2

## 2020-04-15 MED ORDER — HEPARIN SODIUM (PORCINE) 5000 UNIT/ML IJ SOLN
4000.0000 [IU] | Freq: Once | INTRAMUSCULAR | Status: AC
  Administered 2020-04-15: 4000 [IU] via INTRAVENOUS

## 2020-04-15 MED ORDER — ONDANSETRON HCL 4 MG/2ML IJ SOLN: 4.0000 mg | Freq: Four times a day (QID) | INTRAMUSCULAR | Status: DC | PRN

## 2020-04-15 MED ORDER — INSULIN ASPART 100 UNIT/ML ~~LOC~~ SOLN: 0.0000 [IU] | Freq: Every day | SUBCUTANEOUS | Status: DC

## 2020-04-15 NOTE — ED Triage Notes (Signed)
First nurse note- here for generalized weakness with [facility].  [id number]  + orthostatic with EMS. PIV with EMS

## 2020-04-15 NOTE — ED Notes (Signed)
Pt adds  "I had CP while waiting in the lobby" [facility] CP radiates to left side feeling nauseous/SHOB.

## 2020-04-15 NOTE — Patient Instructions (Addendum)
-   STOP Maxzide  - Continue Amlodipine 10 mg daily  - Continue Lisinopril 20 mg daily    - This is the NOT the right time to check for ACTH injection testing as you had received a shoulder steroid injeciton last week. I will check your cortisol level today and depending on that will decide to start you on an empiric dose of hydrocortisone until you start feeling better

## 2020-04-15 NOTE — Telephone Encounter (Signed)
Heather Walker regarding patient bp 144/104 sitting , heart rate is 104 . Patient states shes having headache over right temple since Monday , shaky and week tired . Call was transferred to [facility]

## 2020-04-15 NOTE — ED Provider Notes (Signed)
[facility] Emergency Department Provider Note   ____________________________________________   First MD Initiated Contact with Patient 04/15/20 1836     (approximate)  I have reviewed the triage vital signs and the nursing notes.   HISTORY  Chief Complaint Weakness and Chest Pain    HPI Heather Walker is a 59 y.o. female who was sent from the doctor's office for high blood pressure and weakness.  While she was in the waiting room she began having chest pain radiating from the right side around to the middle.  She got short of breath nauseated may be a little sweaty with it.  EKG was done at that time and showed increased flattening of her T waves with a little bit of T wave inversion in V 4 5 and 6.  There is also a little bit of T wave inversion inferiorly in the limb leads.  This is new since the initial EKG which showed flattening in the flattening is new since an EKG done about 10 days ago.  Pain is not severe it does not vary with respiration it is in the middle of her chest.  Patient is a diabetic hypertensive.  She has no past history of heart disease.  She does have a history of arthritis as well.         Past Medical History:  Diagnosis Date  . Arthritis   . Asthma   . Depression   . Diabetes mellitus without complication ([place])   . Heart murmur   . Hypertension     Patient Active Problem List   Diagnosis Date Noted  . Dizziness 04/15/2020  . Abnormal cortisol level 04/14/2020  . SIRS (systemic inflammatory response syndrome) ([place]) 04/06/2020  . Pneumonia due to COVID-19 virus 05/14/2019  . COVID-19 virus infection 05/13/2019  . Renal insufficiency 03/26/2019  . At risk for cardiovascular event 03/26/2019  . Allergic reaction due to correct medicinal substance properly administered 08/23/2018  . Hypertriglyceridemia 08/05/2018  . Asthma, chronic, unspecified asthma severity, uncomplicated [id number]  . Chronic right shoulder pain  08/05/2018  . Muscle spasm 08/05/2018  . Insomnia 10/05/2017  . Sepsis ([place]) 08/21/2017  . Acute right hip pain 08/21/2017  . Preventative health care 11/20/2016  . Right ankle pain 05/09/2016  . Palpitations 12/08/2014  . DM (diabetes mellitus) type II uncontrolled, periph vascular disorder ([place]) 08/03/2014  . Leg wound, right 08/03/2014  . Hyperglycemia 07/13/2014  . Chest pain 07/12/2014  . Diabetes mellitus ([place]) 07/12/2014  . Acute kidney injury ([place]) 07/12/2014  . Abdominal pain, right upper quadrant 05/28/2014  . Tachycardia 05/03/2013  . Abscess 11/13/2012  . Breast pain, right 08/10/2012  . Osteoarthritis of left shoulder 03/04/2011  . DEPRESSION, MAJOR, RECURRENT, SEVERE 12/22/2009  . BACK PAIN, LUMBAR, WITH RADICULOPATHY 09/17/2009  . CALF PAIN, RIGHT 09/17/2009  . RENAL CYST 08/28/2009  . LOW BACK PAIN, ACUTE 11/13/2008  . FIBROIDS, UTERUS 09/10/2007  . UTI 09/03/2007  . BACTERIAL VAGINITIS 09/03/2007  . PELVIC PAIN, RIGHT 09/03/2007  . Hypokalemia 09/03/2007  . MOLE 06/21/2007  . ALLERGIC RHINITIS 06/21/2007  . HEMOCCULT POSITIVE STOOL 06/21/2007  . SINUSITIS, ACUTE NOS 04/05/2007  . REFLUX, ESOPHAGEAL 04/05/2007  . CHEST PAIN 04/05/2007  . Essential hypertension 03/19/2007  . ALLERGIC RHINITIS, SEASONAL 03/09/2007  . Asthma 03/09/2007    Past Surgical History:  Procedure Laterality Date  . ABDOMINAL HYSTERECTOMY    . APPENDECTOMY    . BLADDER SUSPENSION    . CESAREAN SECTION  3 previous  . TEE WITHOUT CARDIOVERSION N/A 08/24/2017   Procedure: TRANSESOPHAGEAL ECHOCARDIOGRAM (TEE);  Surgeon: Larey Dresser, MD;  Location: [facility] ENDOSCOPY;  Service: Cardiovascular;  Laterality: N/A;  . TUBAL LIGATION      Prior to Admission medications   Medication Sig Start Date End Date Taking? Authorizing Provider  amLODipine (NORVASC) 10 MG tablet Take 1 tablet (10 mg total) by mouth daily. 04/13/20   Amin, Ankit Chirag, MD  glucose blood (ONETOUCH VERIO) test  strip 1 each by Other route 2 (two) times daily. Use as instructed 12/04/19   Shamleffer, Melanie Crazier, MD  insulin isophane & regular human (NOVOLIN 70/30 FLEXPEN) (70-30) 100 UNIT/ML KwikPen Inject 22 Units into the skin 2 (two) times daily with a meal. 03/03/20   Shamleffer, Melanie Crazier, MD  Insulin Pen Needle 32G X 4 MM MISC Inject 1 Device into the skin in the morning and at bedtime. 2x daily 12/04/19   Shamleffer, Melanie Crazier, MD  Lancets [facility] ULTRASOFT) lancets Use as instructed to test blood sugar 2 times daily E11.65 12/11/19   Shamleffer, Melanie Crazier, MD  lisinopril (ZESTRIL) 20 MG tablet Take 1 tablet (20 mg total) by mouth daily. 04/13/20   Amin, Jeanella Flattery, MD  ondansetron (ZOFRAN ODT) 4 MG disintegrating tablet Take 1 tablet (4 mg total) by mouth every 8 (eight) hours as needed for nausea or vomiting. 04/14/20   Carollee Herter, Alferd Apa, DO  OneTouch Delica Lancets 34V MISC 1 Package by Does not apply route in the morning and at bedtime. Use as instructed 2 times daily E11.65 02/20/20   Shamleffer, Melanie Crazier, MD  Semaglutide,0.25 or 0.5MG /DOS, (OZEMPIC, 0.25 OR 0.5 MG/DOSE,) 2 MG/1.5ML SOPN Inject 0.5 mg into the skin once a week. 03/03/20   Shamleffer, Melanie Crazier, MD  traZODone (DESYREL) 50 MG tablet TAKE 1/2 TO 1 TABLET(25 TO 50 MG) BY MOUTH AT BEDTIME AS NEEDED FOR SLEEP Patient taking differently: Take 50-100 mg by mouth at bedtime as needed for sleep.  02/11/20   Roma Schanz R, DO  triamcinolone cream (KENALOG) 0.1 % Apply 1 application topically 2 (two) times daily. 03/03/20   [name], DO    Allergies Crestor [rosuvastatin calcium], Metformin and related, Atorvastatin, and Codeine  Family History  Problem Relation Age of Onset  . Hypertension Mother   . Asthma Mother   . Hypertension Sister   . Asthma Sister   . Diabetes Sister   . Hypertension Maternal Grandmother   . Heart defect Sister   . Asthma Sister   . Aneurysm Sister   .  Asthma Sister     Social History Social History   Tobacco Use  . Smoking status: Never Smoker  . Smokeless tobacco: Never Used  Vaping Use  . Vaping Use: Never used  Substance Use Topics  . Alcohol use: No  . Drug use: No    Review of Systems  Constitutional: No fever/chills Eyes: No visual changes. ENT: No sore throat. Cardiovascular: chest pain. Respiratory: Some shortness of breath. Gastrointestinal: No abdominal pain. nausea, no vomiting.  No diarrhea.  No constipation. Genitourinary: Negative for dysuria. Musculoskeletal: Negative for back pain. Skin: Negative for rash. Neurological: Negative for headaches, focal weakness  ____________________________________________   PHYSICAL EXAM:  VITAL SIGNS: ED Triage Vitals  Enc Vitals Group     BP 04/15/20 1800 (!) 131/98     Pulse Rate 04/15/20 1800 (!) 105     Resp 04/15/20 1800 18  Temp 04/15/20 1800 99.2 F (37.3 C)     Temp Source 04/15/20 1800 Oral     SpO2 04/15/20 1800 97 %     Weight 04/15/20 1801 198 lb (89.8 kg)     Height 04/15/20 1801 5\' 3"  (1.6 m)     Head Circumference --      Peak Flow --      Pain Score 04/15/20 1801 0     Pain Loc --      Pain Edu? --      Excl. in [place]? --     Constitutional: Alert and oriented.  Looks uncomfortable Eyes: Conjunctivae are normal.  Head: Atraumatic. Nose: No congestion/rhinnorhea. Mouth/Throat: Mucous membranes are moist.  Oropharynx non-erythematous. Neck: No stridor.  Cardiovascular: Normal rate, regular rhythm. Grossly normal heart sounds.  Good peripheral circulation. Respiratory: Normal respiratory effort.  No retractions. Lungs CTAB. Gastrointestinal: Soft and nontender. No distention. No abdominal bruits. No CVA tenderness. Musculoskeletal: No lower extremity tenderness nor edema.  . Neurologic:  Normal speech and language. No gross focal neurologic deficits are appreciated. No gait instability. Skin:  Skin is warm, dry and intact. No rash  noted.  ____________________________________________   LABS (all labs ordered are listed, but only abnormal results are displayed)  Labs Reviewed  BASIC METABOLIC PANEL - Abnormal; Notable for the following components:      Result Value   Potassium 3.3 (*)    Glucose, Bld 150 (*)    Creatinine, Ser 1.20 (*)    GFR calc non Af Amer 50 (*)    GFR calc Af Amer 58 (*)    All other components within normal limits  CBC  URINALYSIS, COMPLETE (UACMP) WITH MICROSCOPIC  PROTIME-INR  APTT  CBG MONITORING, ED  TROPONIN I (HIGH SENSITIVITY)   ____________________________________________  EKG  EKG done during pain shows normal sinus rhythm at a rate of 88 left axis slightly flipped T waves in V45 and 6 and in lead II and F.  This was not present on the EKG done by EMS.  That EKG showed a lot of flattening EKGs done on 21st June of this year showed essentially normal configuration of the T waves decreased R wave progression and and in the chest leads though that EKG from the 21st of this year looks very similar to an EKG from 27 July of last year. ____________________________________________  [place]  ED MD interpretation wrist x-ray read by radiology reviewed by me shows no acute pathology  Official radiology report(s): DG Chest 2 View  Result Date: 04/15/2020 CLINICAL DATA:  Chest pain EXAM: CHEST - 2 VIEW COMPARISON:  04/06/2020 FINDINGS: The heart size and mediastinal contours are within normal limits. Both lungs are clear. The visualized skeletal structures are unremarkable. IMPRESSION: Normal study. Electronically Signed   By: Rolm Baptise M.D.   On: 04/15/2020 18:59    ____________________________________________   PROCEDURES  Procedure(s) performed (including Critical Care): Critical care time 25 minutes this includes reviewing the patient's old records and speaking to the patient at least 3 times to check on her.  I am also going to talk to the  hospitalist.  Procedures   ____________________________________________   INITIAL IMPRESSION / ASSESSMENT AND PLAN / ED COURSE     Patient with ongoing EKG changes but no STEMI.  Pain resolved in the ER with nitro and aspirin.  We will give her a heparin bolus and drip consult the hospitalist about admission.  Her troponins are not back at work but with  the history and EKG changes I do not believe we need troponins to get her in the hospital.            ____________________________________________   FINAL CLINICAL IMPRESSION(S) / ED DIAGNOSES  Final diagnoses:  Chest pain, unspecified type  Abnormal EKG     ED Discharge Orders    None       Note:  This document was prepared using Dragon voice recognition software and may include unintentional dictation errors.    Nena Polio, MD 04/15/20 [place]

## 2020-04-15 NOTE — Telephone Encounter (Signed)
Telephone note sent to [facility]

## 2020-04-15 NOTE — ED Triage Notes (Addendum)
Pt from home via [facility]. Pt DC on Sunday 6/27, since DC pt with "extremely high BP at home" st was taken off of clonidine and place on lisinopril.   St seeing her PCP yesterday and today due to HTN. Pt st PCP sent to ED for evaluation of HTN and general weakness.   St intermittent mild headache since Monday, denies blurry vision.  BI equal grip strength. Denies recent illness.

## 2020-04-15 NOTE — H&P (Signed)
History and Physical    Heather Walker:[id number] DOB: 04/11/1961 DOA: 04/15/2020  PCP: [name], DO  Patient coming from: Home, son at bedside  I have personally briefly reviewed patient's old medical records in [place]  Chief Complaint: weakness and chest pain  HPI: Heather Walker is a 59 y.o. female with medical history significant forHistory of hypertension, insulin-dependent type 2 diabetes, asthma and depression who presents with concerns of persistent weakness and chest pain.  Patient initially presented because of weakness and reportedly by son at bedside she was working with physical therapy today and noted to have some "abnormal" vital signs.  Patient then states that she noted right-sided pressure chest pain while in triage.  She felt this about 2 days ago as well and it lasted for a few seconds.  Today she feels like they lasted longer.  The pain starts to her right side and radiates to her right chest with associated nausea and some diaphoresis.  States she has been feeling shortness of breath with exertion for the past several weeks.  Denies any fevers.  No cough or runny nose.  Has had decreased appetite.  Patient recently hospitalized at [facility] from 6/21-6/27 for dizziness, hypokalemia and right shoulder pain due to adhesive capsulitis.  Patient at that time had normal TSH, negative MRI brain.  She was noted to have low cortisol level in the hospital concerning for adrenal insufficiency.  ACTH stimulation test was ordered but patient wanted to postpone the lab outpatient.  This has still not been done.  She had temperature of 99.2, initially mildly tachycardic and normotensive on room air. CBC showed no leukocytosis or anemia.  Troponin flat at 6 and 6.  EKG shows some flattening of the T waves in V3, 4, 5 and 6. Potassium 3.3, creatinine 1.20.  No active chest pain at the time of my evaluation.  ED physician Dr. Rip Harbour discussed case with  cardiology Dr. Oval Linsey who recommended starting patient on heparin drip.  Review of Systems:  Constitutional: No Weight Change, No Fever ENT/Mouth: No sore throat, No Rhinorrhea Eyes: No Eye Pain, No Vision Changes Cardiovascular: + Chest Pain,+ SOB, No PND, No Dyspnea on Exertion, No Orthopnea,  No Edema, No Palpitations Respiratory: No Cough, No Sputum, Gastrointestinal: No Nausea, No Vomiting, No Diarrhea, No Constipation, No Pain Genitourinary: no Urinary Incontinence, Musculoskeletal: No Arthralgias, No Myalgias Skin: No Skin Lesions, No Pruritus, Neuro: + Weakness, No Numbness,  Psych: No Anxiety/Panic, No Depression, + decrease appetite Heme/Lymph: No Bruising, No Bleeding  Past Medical History:  Diagnosis Date  . Arthritis   . Asthma   . Depression   . Diabetes mellitus without complication ([place])   . Heart murmur   . Hypertension     Past Surgical History:  Procedure Laterality Date  . ABDOMINAL HYSTERECTOMY    . APPENDECTOMY    . BLADDER SUSPENSION    . CESAREAN SECTION     3 previous  . TEE WITHOUT CARDIOVERSION N/A 08/24/2017   Procedure: TRANSESOPHAGEAL ECHOCARDIOGRAM (TEE);  Surgeon: Larey Dresser, MD;  Location: [facility] ENDOSCOPY;  Service: Cardiovascular;  Laterality: N/A;  . TUBAL LIGATION       reports that she has never smoked. She has never used smokeless tobacco. She reports that she does not drink alcohol and does not use drugs.  Allergies  Allergen Reactions  . Crestor [Rosuvastatin Calcium] Anaphylaxis  . Metformin And Related Diarrhea    Nausea and diarrhea  .  Atorvastatin Other (See Comments)    Neck stiffness  . Codeine Itching    Family History  Problem Relation Age of Onset  . Hypertension Mother   . Asthma Mother   . Hypertension Sister   . Asthma Sister   . Diabetes Sister   . Hypertension Maternal Grandmother   . Heart defect Sister   . Asthma Sister   . Aneurysm Sister   . Asthma Sister      Prior to Admission medications    Medication Sig Start Date End Date Taking? Authorizing Provider  amLODipine (NORVASC) 10 MG tablet Take 1 tablet (10 mg total) by mouth daily. 04/13/20  Yes Amin, Ankit Chirag, MD  glucose blood (ONETOUCH VERIO) test strip 1 each by Other route 2 (two) times daily. Use as instructed 12/04/19  Yes Shamleffer, Melanie Crazier, MD  insulin isophane & regular human (NOVOLIN 70/30 FLEXPEN) (70-30) 100 UNIT/ML KwikPen Inject 22 Units into the skin 2 (two) times daily with a meal. 03/03/20  Yes Shamleffer, Melanie Crazier, MD  Insulin Pen Needle 32G X 4 MM MISC Inject 1 Device into the skin in the morning and at bedtime. 2x daily 12/04/19  Yes Shamleffer, Melanie Crazier, MD  Lancets [facility] ULTRASOFT) lancets Use as instructed to test blood sugar 2 times daily E11.65 12/11/19  Yes Shamleffer, Melanie Crazier, MD  lisinopril (ZESTRIL) 20 MG tablet Take 1 tablet (20 mg total) by mouth daily. 04/13/20  Yes Amin, Ankit Chirag, MD  ondansetron (ZOFRAN ODT) 4 MG disintegrating tablet Take 1 tablet (4 mg total) by mouth every 8 (eight) hours as needed for nausea or vomiting. 04/14/20  Yes Carollee Herter, Alferd Apa, DO  OneTouch Delica Lancets 19Q MISC 1 Package by Does not apply route in the morning and at bedtime. Use as instructed 2 times daily E11.65 02/20/20  Yes Shamleffer, Melanie Crazier, MD  Semaglutide,0.25 or 0.5MG /DOS, (OZEMPIC, 0.25 OR 0.5 MG/DOSE,) 2 MG/1.5ML SOPN Inject 0.5 mg into the skin once a week. Patient taking differently: Inject 0.5 mg into the skin every Sunday.  03/03/20  Yes Shamleffer, Melanie Crazier, MD  traZODone (DESYREL) 50 MG tablet TAKE 1/2 TO 1 TABLET(25 TO 50 MG) BY MOUTH AT BEDTIME AS NEEDED FOR SLEEP Patient taking differently: Take 50-100 mg by mouth at bedtime as needed for sleep.  02/11/20  Yes Roma Schanz R, DO  triamcinolone cream (KENALOG) 0.1 % Apply 1 application topically 2 (two) times daily. 03/03/20  Yes [name], DO    Physical Exam: Vitals:   04/15/20  1800 04/15/20 1801 04/15/20 1900 04/15/20 1930  BP: (!) 131/98  (!) 149/99 (!) 148/101  Pulse: (!) 105  83 87  Resp: 18  16 10   Temp: 99.2 F (37.3 C)     TempSrc: Oral     SpO2: 97%  93% 96%  Weight:  89.8 kg    Height:  5\' 3"  (1.6 m)      Constitutional: NAD, calm, comfortable, drowsy and fatigue obese female laying flat in bed Vitals:   04/15/20 1800 04/15/20 1801 04/15/20 1900 04/15/20 1930  BP: (!) 131/98  (!) 149/99 (!) 148/101  Pulse: (!) 105  83 87  Resp: 18  16 10   Temp: 99.2 F (37.3 C)     TempSrc: Oral     SpO2: 97%  93% 96%  Weight:  89.8 kg    Height:  5\' 3"  (1.6 m)     Eyes: PERRL, lids and conjunctivae normal ENMT: Mucous membranes are  moist. Neck: normal, supple Respiratory: clear to auscultation bilaterally, no wheezing, no crackles. Normal respiratory effort. No accessory muscle use.  Cardiovascular: Regular rate and rhythm, no murmurs / rubs / gallops. No extremity edema.  Abdomen: no tenderness, no masses palpated.  Bowel sounds positive.  Musculoskeletal: no clubbing / cyanosis. No joint deformity upper and lower extremities. Good ROM, no contractures. Normal muscle tone.  Skin: no rashes, lesions, ulcers. No induration Neurologic: CN 2-12 grossly intact. Sensation intact, Strength 4/5 in all 4.  Psychiatric: Normal judgment and insight. Alert and oriented x 3 although drowsy and has eyes closed throughout most of exam.  Normal mood.     Labs on Admission: I have personally reviewed following labs and imaging studies  CBC: Recent Labs  Lab 04/10/20 0339 04/11/20 0815 04/12/20 0444 04/14/20 1601 04/15/20 1810  WBC 4.5 7.8 7.4 7.2 7.4  NEUTROABS  --   --   --  3.7  --   HGB 11.8* 11.1* 12.2 14.6 14.1  HCT 35.7* 33.8* 36.4 44.2 41.5  MCV 83.8 83.3 82.2 84.2 82.2  PLT 192 236 281 369.0 824   Basic Metabolic Panel: Recent Labs  Lab 04/09/20 0357 04/09/20 0357 04/10/20 0339 04/11/20 0815 04/12/20 0444 04/14/20 1601 04/15/20 1810  NA 140    < > 139 140 140 141 144  K 3.3*   < > 3.7 3.4* 3.2* 3.5 3.3*  CL 105   < > 104 106 104 101 104  CO2 28   < > 26 25 28 29 31   GLUCOSE 99   < > 226* 161* 129* 167* 150*  BUN 15   < > 13 13 14 18 17   CREATININE 1.07*   < > 0.92 0.71 0.77 1.03 1.20*  CALCIUM 8.0*   < > 8.4* 8.2* 8.3* 9.9 9.1  MG 2.1  --  1.5* 1.9 2.5*  --   --    < > = values in this interval not displayed.   GFR: Estimated Creatinine Clearance: 55 mL/min (A) (by C-G formula based on SCr of 1.2 mg/dL (H)). Liver Function Tests: Recent Labs  Lab 04/14/20 1601  AST 23  ALT 32  ALKPHOS 92  BILITOT 0.4  PROT 8.4*  ALBUMIN 4.2   No results for input(s): LIPASE, AMYLASE in the last 168 hours. No results for input(s): AMMONIA in the last 168 hours. Coagulation Profile: Recent Labs  Lab 04/15/20 1945  INR 1.0   Cardiac Enzymes: No results for input(s): CKTOTAL, CKMB, CKMBINDEX, TROPONINI in the last 168 hours. BNP (last 3 results) No results for input(s): PROBNP in the last 8760 hours. HbA1C: No results for input(s): HGBA1C in the last 72 hours. CBG: Recent Labs  Lab 04/11/20 1135 04/11/20 1747 04/11/20 2115 04/12/20 0756 04/12/20 1147  GLUCAP 113* 161* 136* 136* 119*   Lipid Profile: No results for input(s): CHOL, HDL, LDLCALC, TRIG, CHOLHDL, LDLDIRECT in the last 72 hours. Thyroid Function Tests: No results for input(s): TSH, T4TOTAL, FREET4, T3FREE, THYROIDAB in the last 72 hours. Anemia Panel: No results for input(s): VITAMINB12, FOLATE, FERRITIN, TIBC, IRON, RETICCTPCT in the last 72 hours. Urine analysis:    Component Value Date/Time   COLORURINE YELLOW 04/06/2020 [place] 04/06/2020 1355   LABSPEC 1.015 04/06/2020 1355   PHURINE 7.0 04/06/2020 1355   GLUCOSEU NEGATIVE 04/06/2020 1355   HGBUR NEGATIVE 04/06/2020 1355   HGBUR negative 09/03/2007 [place] 04/06/2020 1355   BILIRUBINUR neg 05/09/2016 1324   KETONESUR  NEGATIVE 04/06/2020 [place] 04/06/2020 1355   UROBILINOGEN 0.2 05/09/2016 1324   UROBILINOGEN 1.0 02/09/2014 1257   NITRITE NEGATIVE 04/06/2020 1355   LEUKOCYTESUR NEGATIVE 04/06/2020 1355    Radiological Exams on Admission: DG Chest 2 View  Result Date: 04/15/2020 CLINICAL DATA:  Chest pain EXAM: CHEST - 2 VIEW COMPARISON:  04/06/2020 FINDINGS: The heart size and mediastinal contours are within normal limits. Both lungs are clear. The visualized skeletal structures are unremarkable. IMPRESSION: Normal study. Electronically Signed   By: Rolm Baptise M.D.   On: 04/15/2020 18:59      Assessment/Plan  Atypical chest pain Patient with right-sided chest pain at rest.  She has some flattening of her T waves in V3, 4, 5 and 6. ED physician Dr. Rip Harbour discussed with cardiology who recommended starting heparin drip Continue to trend troponins overnight.  Initial troponin flat at 6 and 6 PRN SL nitro and repeat EKG Obtain echocardiogram  Hypokalemia will replete  AKI Prerenal due to decreased p.o. intake.  Continuous IV 75 cc normal saline fluid  Persistent weakness Patient previously hospitalized from 6/21 through 6/27 and noted to have low cortisol at that time concerning for adrenal insufficiency.  ACTH stimulation was not done since patient wanted to do outpatient. We will obtain ACTH stimulation test in the morning PT evaluation  Insulin-dependent type 2 diabetes Normally on Novolin 70/30 22 units daily with meals Last hemoglobin A1c of 8.7 on 5/18 Start with moderate sliding scale  Hypertension Continue amlodipine, hold lisinopril due to AKI  Obesity BMI greater than 35  DVT prophylaxis: Heparin gtt Code Status: Full Family Communication: Plan discussed with patient and son at bedside  disposition Plan: Home with observation Consults called:  Admission status: Observation  Status is: Observation  The patient remains OBS appropriate and will d/c before 2 midnights.  Dispo: The  patient is from: Home              Anticipated d/c is to: Home              Anticipated d/c date is: 1 day              Patient currently is not medically stable to d/c.         Orene Desanctis DO [facility]   If 7PM-7AM, please contact night-coverage [email or web address]   04/15/2020, 10:07 PM

## 2020-04-15 NOTE — ED Notes (Signed)
Pt presents to ED via EMS with c/o generalized weakness and HTN. Pt states was sent to ED for eval by PCP at f/u appt today. Pt appears ill-appearing at this time, states was admitted approx 1 week ago for shoulder pain and "I can't even tell you what else". Pt alert and able to answer questions without difficulty.

## 2020-04-15 NOTE — Progress Notes (Addendum)
Name: Heather Walker  Age/ Sex: 59 y.o., female   MRN/ DOB: [id number], 1960-10-20     PCP: [name], DO   Reason for Endocrinology Evaluation: Type 2 Diabetes Mellitus  Initial Endocrine Consultative Visit: 09/26/2018    PATIENT IDENTIFIER: [name] is a 59 y.o. female with a past medical history of HTN, T2DM, GERD and Dyslipidemia . The patient has followed with Endocrinology clinic since 09/26/2018 for consultative assistance with management of her diabetes.  DIABETIC HISTORY:  Heather Walker was diagnosed with T2DM in 2015. She has reported GI intolerance to Metformin. She was unable to try Jardiance due to insurance issues. Wilder Glade gave her neck pain. Her hemoglobin A1c has ranged from 8.5%  in 2016, peaking at 9.8% in 2019.   On her initial visit to our clinic her A1c 9.8%. She was not taking any of her prescribed medications. We stopped Glipizide and started Novolog mix.  She has declined a CDE referral  Ozempic started 02/2020  SUBJECTIVE:   During the last visit (03/03/2020): A1c 8.7 %. Started Ozempic and decreased insulin mix     Today (04/15/2020): Heather Walker is here for a follow up on recent hospitalization . She presented to the ED on 04/06/2020 with shoulder pain and chills. CT abdomen was negative, MRI of shoulder showed adhesive capsulitis. She is S/P a shoulder intra-articular injection on 6/24th.  Due to dizziness and hypokalemia ( 2.7 mmol/L) , cortisol was checked and it was low at 1.9 ug/dL by 914AM , staff attempted to proceed with Cosyntropin stim. Test but the pt did not want to wait.  Of note her BP upon presentation was 163/116  mmhg   Today she feels nauseous, tired, weak in the legs but has good appetite ,slept well at night.Has slight dizziness.  Denies diarrhea      Started lisinopril today  Has been out of insulin for the past week    HOME DIABETES REGIMEN:   Novolin Mix (70/30) 22 units BID   Ozempic 0.25 mg weekly     METER DOWNLOAD SUMMARY: Did not bring    DIABETIC COMPLICATIONS: Microvascular complications:   Denies: retinopathy , neuropathy  Last eye exam: Completed 01/2019  Macrovascular complications:   Denies: CAD, PVD, CVA      HISTORY:  Past Medical History:  Past Medical History:  Diagnosis Date  . Arthritis   . Asthma   . Depression   . Diabetes mellitus without complication ([place])   . Heart murmur   . Hypertension    Past Surgical History:  Past Surgical History:  Procedure Laterality Date  . ABDOMINAL HYSTERECTOMY    . APPENDECTOMY    . BLADDER SUSPENSION    . CESAREAN SECTION     3 previous  . TEE WITHOUT CARDIOVERSION N/A 08/24/2017   Procedure: TRANSESOPHAGEAL ECHOCARDIOGRAM (TEE);  Surgeon: Larey Dresser, MD;  [facility]: [facility] ENDOSCOPY;  Service: Cardiovascular;  Laterality: N/A;  . TUBAL LIGATION      Social History:  reports that she has never smoked. She has never used smokeless tobacco. She reports that she does not drink alcohol and does not use drugs. Family History:  Family History  Problem Relation Age of Onset  . Hypertension Mother   . Asthma Mother   . Hypertension Sister   . Asthma Sister   . Diabetes Sister   . Hypertension Maternal Grandmother   . Heart defect Sister   . Asthma Sister   .  Aneurysm Sister   . Asthma Sister      HOME MEDICATIONS: Allergies as of 04/15/2020      Reactions   Crestor [rosuvastatin Calcium] Anaphylaxis   Metformin And Related Diarrhea   Nausea and diarrhea   Atorvastatin Other (See Comments)   Neck stiffness   Codeine Itching      Medication List       Accurate as of April 15, 2020  4:04 PM. If you have any questions, ask your nurse or doctor.        STOP taking these medications   triamterene-hydrochlorothiazide 75-50 MG tablet Commonly known as: MAXZIDE Stopped by: Dorita Sciara, MD     TAKE these medications   amLODipine 10 MG tablet Commonly known as: NORVASC Take 1 tablet  (10 mg total) by mouth daily.   Insulin Pen Needle 32G X 4 MM Misc Inject 1 Device into the skin in the morning and at bedtime. 2x daily   lisinopril 20 MG tablet Commonly known as: ZESTRIL Take 1 tablet (20 mg total) by mouth daily.   NovoLIN 70/30 FlexPen (70-30) 100 UNIT/ML KwikPen Generic drug: insulin isophane & regular human Inject 22 Units into the skin 2 (two) times daily with a meal.   ondansetron 4 MG disintegrating tablet Commonly known as: Zofran ODT Take 1 tablet (4 mg total) by mouth every 8 (eight) hours as needed for nausea or vomiting.   onetouch ultrasoft lancets Use as instructed to test blood sugar 2 times daily G25.42   OneTouch Delica Lancets 70W Misc 1 Package by Does not apply route in the morning and at bedtime. Use as instructed 2 times daily E11.65   OneTouch Verio test strip Generic drug: glucose blood 1 each by Other route 2 (two) times daily. Use as instructed   Ozempic (0.25 or 0.5 MG/DOSE) 2 MG/1.5ML Sopn Generic drug: Semaglutide(0.25 or 0.5MG /DOS) Inject 0.5 mg into the skin once a week.   traZODone 50 MG tablet Commonly known as: DESYREL TAKE 1/2 TO 1 TABLET(25 TO 50 MG) BY MOUTH AT BEDTIME AS NEEDED FOR SLEEP What changed:   how much to take  how to take this  when to take this  reasons to take this  additional instructions   triamcinolone cream 0.1 % Commonly known as: KENALOG Apply 1 application topically 2 (two) times daily.        OBJECTIVE:   Vital Signs: BP 132/88 (BP Location: Left Arm, Patient Position: Sitting, Cuff Size: Large)   Pulse (!) 103   Ht 5\' 3"  (1.6 m)   SpO2 98%   BMI 35.07 kg/m   Wt Readings from Last 3 Encounters:  04/06/20 198 lb (89.8 kg)  03/03/20 201 lb 6.4 oz (91.4 kg)  03/03/20 202 lb (91.6 kg)     Exam: General: Pt appears lethargic in a wheelchair   Lungs: Clear with good BS bilat with no rales, rhonchi, or wheezes  Heart: RRR   Extremities: No pretibial edema.   Neuro: MS is  good with appropriate affect, pt is alert and Ox3    DM foot exam:12/04/2019 The skin of the feet is intact without sores or ulcerations. The pedal pulses are 2+ on right and 2+ on left. The sensation is intact to a screening 5.07, 10 gram monofilament bilaterally         DATA REVIEWED:  Lab Results  Component Value Date   HGBA1C 8.7 (A) 03/03/2020   HGBA1C 12.0 (A) 12/04/2019   HGBA1C 11.5 (H) 02/20/2019  Lab Results  Component Value Date   MICROALBUR <0.7 12/04/2019   LDLCALC 87 02/20/2019   CREATININE 1.03 04/14/2020   Lab Results  Component Value Date   MICRALBCREAT 1.6 12/04/2019     Lab Results  Component Value Date   CHOL 173 02/20/2019   HDL 51.90 02/20/2019   LDLCALC 87 02/20/2019   LDLDIRECT 115.0 08/02/2018   TRIG 172.0 (H) 02/20/2019   CHOLHDL 3 02/20/2019          ACTH < 5 pg/mL   ASSESSMENT / PLAN / RECOMMENDATIONS:   1) Low serum cortisol:  - Pt presented to the  ED with a BP of 163/116  Mmhg, and hypokalemia of 2.7 mmol/L , these are not typical presentation of adrenal insufficiency , there are other explanations for this as below.  - Low serum cortisol could be explained by the fact that she had just received a shoulder intra-articular injection , or could be due to relative adrenal insufficiency due to SIRS during her presentation. Repeat cortisol today is normal at 7 ug/dL which is reassuring but her ACTH is undetectable, given weakness and nausea will proceed with hydrocortisone replacement until confirmation of HPA axis recovery  have been confirmed.  - Will not proceed with cosyntropin stim test at this time   Medication:  HC 5 mg, 2 tabs QAM and 1 tablet QPM between 2-4 pm    2) Dizziness/ Hypokalemia :  - Most likely dizziness  related to uncontrolled hypertension- this has resolved  - Unclear cause for hypokalemia, will stop Maxzide as this exacerbates it, will recheck BP in 1 weeks  - Will consider Aldo: renin check on next  visit in 6 weeks    3) Nausea:  - I thought this may be related to [place] but she assures me  her nausea did not start until hospitalization , and has been taking Ozempic for the past 1 month without issues.  - Will hold Ozempic at this time until further evaluation     2)Type 2 Diabetes Mellitus, Improving Glycemic Control, Without complications - Most recent A1c of  8.7 %. Goal A1c < 7.0 %.    - I am going to hold off on Ozempic as it will exacerbate underlying nausea  - Pt encouraged to restart insulin   MEDICATIONS:   Hold off on Ozempic 0.5 mg weekly for now  Novolin mix (70/30) 22 units BID before breakfast and supper   EDUCATION / INSTRUCTIONS:  BG monitoring instructions: Patient is instructed to check her blood sugars 2 times a day, fasting and supper time.  Call [place] [facility] if: BG persistently < 70  . I reviewed the Rule of 15 for the treatment of hypoglycemia in detail with the patient. Literature supplied.   2) Diabetic complications:   Eye: Does not have known diabetic retinopathy.   Neuro/ Feet: Does not have known diabetic peripheral neuropathy .   Renal: Patient does not have known baseline CKD, but has a low GFR for the first time , will continue to monitor. She   is  on an ACEI/ARB at present.     F/U 06/02/2020   Addendum: Attempted to call the pt 6/30 @ 1630 with no answer   Signed electronically by: Mack Guise, MD  [facility] Endocrinology  [facility] [address] [place], [place] [address] Phone: [phone] FAX: [phone]   CC: [name], DO [address] [place]   [place] Phone: [phone]  Fax: [phone]  Return to Endocrinology clinic as below: Future Appointments  Date Time Provider [place]  04/22/2020  2:30 PM Star Age, MD [facility] None  04/23/2020 10:30 AM [facility] NURSE [facility] None  06/02/2020  1:00 PM  Mihir Flanigan, Melanie Crazier, MD [facility] [place]  07/16/2020  1:00 PM [name], DO [facility]

## 2020-04-15 NOTE — ED Notes (Signed)
EDP at bedside  

## 2020-04-15 NOTE — Telephone Encounter (Signed)
Spoke with the physical therapist, Amy. She states patient is lethargic and states BP is 144/100 and heart rate 104. And a headache over the right temple. Pt able to eat breakfast but no appetite since. I advised Amy that patient was told to go back to the ED is sxs worsened. Amy stated she will advise patient.

## 2020-04-15 NOTE — Consult Note (Signed)
[place] for Heparin Indication: chest pain/ACS  Allergies  Allergen Reactions  . Crestor [Rosuvastatin Calcium] Anaphylaxis  . Metformin And Related Diarrhea    Nausea and diarrhea  . Atorvastatin Other (See Comments)    Neck stiffness  . Codeine Itching    Patient Measurements: Height: 5\' 3"  (160 cm) Weight: 89.8 kg (198 lb) IBW/kg (Calculated) : 52.4 Heparin Dosing Weight: 72.8 kg  Vital Signs: Temp: 99.2 F (37.3 C) (06/30 1800) Temp Source: Oral (06/30 1800) BP: 149/99 (06/30 1900) Pulse Rate: 83 (06/30 1900)  Labs: Recent Labs    04/14/20 1601 04/15/20 1810  HGB 14.6 14.1  HCT 44.2 41.5  PLT 369.0 345  CREATININE 1.03 1.20*  TROPONINIHS  --  6    Estimated Creatinine Clearance: 55 mL/min (A) (by C-G formula based on SCr of 1.2 mg/dL (H)).   Medical History: Past Medical History:  Diagnosis Date  . Arthritis   . Asthma   . Depression   . Diabetes mellitus without complication ([place])   . Heart murmur   . Hypertension     Medications:  (Not in a hospital admission)  Scheduled:  . heparin  4,000 Units Intravenous Once   Infusions:  . heparin     PRN:  Anti-infectives (From admission, onward)   None      Assessment: Pharmacy consulted to start heparin for ACS. No DOAC PTA. Baseline labs ordered.   Goal of Therapy:  Heparin level 0.3-0.7 units/ml Monitor platelets by anticoagulation protocol: Yes   Plan:  Give 4000 units bolus x 1 Start heparin infusion at 1000 units/hr Check anti-Xa level in 6 hours and daily while on heparin Continue to monitor H&H and platelets  Oswald Hillock, PharmD, BCPS 04/15/2020,7:14 PM

## 2020-04-16 ENCOUNTER — Observation Stay
Admit: 2020-04-16 | Discharge: 2020-04-16 | Disposition: A | Discharge status: Home / Self Care | Payer: Medicare HMO | Attending: Nurse Practitioner | Admitting: Nurse Practitioner

## 2020-04-16 ENCOUNTER — Ambulatory Visit: Payer: Medicare HMO

## 2020-04-16 ENCOUNTER — Telehealth: Payer: Self-pay | Admitting: Internal Medicine

## 2020-04-16 DIAGNOSIS — R079 Chest pain, unspecified: Secondary | ICD-10-CM | POA: Diagnosis not present

## 2020-04-16 DIAGNOSIS — E876 Hypokalemia: Secondary | ICD-10-CM | POA: Diagnosis not present

## 2020-04-16 DIAGNOSIS — R0789 Other chest pain: Secondary | ICD-10-CM | POA: Diagnosis not present

## 2020-04-16 DIAGNOSIS — R531 Weakness: Secondary | ICD-10-CM | POA: Diagnosis not present

## 2020-04-16 LAB — BASIC METABOLIC PANEL
Anion gap: 9 (ref 5–15)
BUN: 13 mg/dL (ref 6–20)
CO2: 27 mmol/L (ref 22–32)
Calcium: 8.2 mg/dL — ABNORMAL LOW (ref 8.9–10.3)
Chloride: 105 mmol/L (ref 98–111)
Creatinine, Ser: 1.04 mg/dL — ABNORMAL HIGH (ref 0.44–1.00)
GFR calc Af Amer: >60 mL/min (ref >60)
GFR calc non Af Amer: 60 mL/min — ABNORMAL LOW (ref >60)
Glucose, Bld: 203 mg/dL — ABNORMAL HIGH (ref 70–99)
Potassium: 3.5 mmol/L (ref 3.5–5.1)
Sodium: 141 mmol/L (ref 135–145)

## 2020-04-16 LAB — ACTH STIMULATION, 3 TIME POINTS
Cortisol, 30 Min: 8.8 ug/dL
Cortisol, 60 Min: 9.7 ug/dL
Cortisol, Base: 9.2 ug/dL

## 2020-04-16 LAB — URINALYSIS, COMPLETE (UACMP) WITH MICROSCOPIC
Bilirubin Urine: NEGATIVE
Glucose, UA: NEGATIVE mg/dL
Hgb urine dipstick: NEGATIVE
Ketones, ur: NEGATIVE mg/dL
Leukocytes,Ua: NEGATIVE
Nitrite: NEGATIVE
Protein, ur: NEGATIVE mg/dL
Specific Gravity, Urine: 1.015 (ref 1.005–1.030)
pH: 6 (ref 5.0–8.0)

## 2020-04-16 LAB — ECHOCARDIOGRAM COMPLETE
Height: 63 in
Weight: 3168 oz

## 2020-04-16 LAB — CBC
HCT: 38.6 % (ref 36.0–46.0)
Hemoglobin: 13.4 g/dL (ref 12.0–15.0)
MCH: 27.5 pg (ref 26.0–34.0)
MCHC: 34.7 g/dL (ref 30.0–36.0)
MCV: 79.1 fL — ABNORMAL LOW (ref 80.0–100.0)
Platelets: 341 10*3/uL (ref 150–400)
RBC: 4.88 MIL/uL (ref 3.87–5.11)
RDW: 14.6 % (ref 11.5–15.5)
WBC: 7.4 10*3/uL (ref 4.0–10.5)
nRBC: 0 % (ref 0.0–0.2)

## 2020-04-16 LAB — GLUCOSE, CAPILLARY
Glucose-Capillary: 141 mg/dL — ABNORMAL HIGH (ref 70–99)
Glucose-Capillary: 191 mg/dL — ABNORMAL HIGH (ref 70–99)

## 2020-04-16 LAB — MAGNESIUM: Magnesium: 1.6 mg/dL — ABNORMAL LOW (ref 1.7–2.4)

## 2020-04-16 LAB — HEPARIN LEVEL (UNFRACTIONATED): Heparin Unfractionated: 1.15 IU/mL — ABNORMAL HIGH (ref 0.30–0.70)

## 2020-04-16 LAB — TROPONIN I (HIGH SENSITIVITY)
Troponin I (High Sensitivity): 6 ng/L (ref <18)
Troponin I (High Sensitivity): 6 ng/L (ref <18)

## 2020-04-16 MED ORDER — MAGNESIUM SULFATE 2 GM/50ML IV SOLN
2.0000 g | Freq: Once | INTRAVENOUS | Status: AC
  Administered 2020-04-16: 2 g via INTRAVENOUS
  Filled 2020-04-16: qty 50

## 2020-04-16 MED ORDER — METOPROLOL SUCCINATE ER 50 MG PO TB24
25.0000 mg | ORAL_TABLET | Freq: Every day | ORAL | Status: DC
  Administered 2020-04-16: 25 mg via ORAL
  Filled 2020-04-16: qty 1

## 2020-04-16 MED ORDER — SODIUM CHLORIDE 0.9 % IV BOLUS
1000.0000 mL | Freq: Once | INTRAVENOUS | Status: AC
  Administered 2020-04-16: 1000 mL via INTRAVENOUS

## 2020-04-16 MED ORDER — LISINOPRIL 10 MG PO TABS
10.0000 mg | ORAL_TABLET | Freq: Every day | ORAL | Status: DC
  Administered 2020-04-16: 10 mg via ORAL
  Filled 2020-04-16: qty 1

## 2020-04-16 MED ORDER — POTASSIUM CHLORIDE CRYS ER 20 MEQ PO TBCR
40.0000 meq | EXTENDED_RELEASE_TABLET | Freq: Once | ORAL | Status: AC
  Administered 2020-04-16: 40 meq via ORAL
  Filled 2020-04-16: qty 2

## 2020-04-16 MED ORDER — HEPARIN (PORCINE) 25000 UT/250ML-% IV SOLN
700.0000 [IU]/h | INTRAVENOUS | Status: DC
  Administered 2020-04-16: 700 [IU]/h via INTRAVENOUS

## 2020-04-16 NOTE — ED Notes (Signed)
Pt ate 75%  Lunch tray.

## 2020-04-16 NOTE — ED Notes (Signed)
Pt receiving echo currently, tolerating well. Appears in no distress at this time. Call light in reach.

## 2020-04-16 NOTE — ED Notes (Signed)
Pt able to stand and pivot into wheelchair with assistance. Pt is not steady on feet without assistance.

## 2020-04-16 NOTE — Telephone Encounter (Signed)
Please let her /spouse  Know that her cortisol level has normalized, potassium is normal as well but I suggest she STOPS  the Ozempic at this time , but continue with insulin.   I tried to call yesterday but no answer  Thanks   Abby Nena Jordan, MD  [facility] Endocrinology  [facility] [address] [place], [place] [address] Phone: [phone] FAX: [phone]

## 2020-04-16 NOTE — Progress Notes (Signed)
*  PRELIMINARY RESULTS* Echocardiogram 2D Echocardiogram has been performed.  Heather Walker 04/16/2020, 11:58 AM

## 2020-04-16 NOTE — Discharge Summary (Addendum)
Heather Walker MHD:[id number] DOB: 20-Oct-1960 DOA: 04/15/2020  PCP: Heather Held, DO  Admit date: 04/15/2020  Discharge date: 04/16/2020  Admitted From: Home   disposition: Home   Recommendations for Outpatient Follow-up:   Follow up with PCP in 1 week  PCP Please obtain BMP and magnesium with attention to creatinine and potassium.  Patient was dehydrated on admission most likely secondary to decreased p.o. intake and had hypokalemia and hypomagnesemia which have been repleted.  Please also consider assessing patient for depressed mood which may be contributing to her decreased p.o. intake.   Home Health: Already has home PT Equipment/Devices: Already has a rolling walker and DME as she needs it Consultations: PT Discharge Condition: Improved CODE STATUS: Full Diet Recommendation: Regular diet, what ever you would like to eat but do try to eat well.     Chief Complaint  Patient presents with  . Weakness  . Chest Pain     Brief history of present illness from the day of admission and additional interim summary    Heather Walker is a 59 y.o. female with medical history significant forHistory of hypertension, insulin-dependent type 2 diabetes, asthma and depression who presents with concerns of persistent weakness and chest pain.  Patient initially presented because of weakness and reportedly by son at bedside she was working with physical therapy today and noted to have some "abnormal" vital signs.  Patient then states that she noted right-sided pressure chest pain while in triage.  She felt this about 2 days ago as well and it lasted for a few seconds.  Today she feels like they lasted longer.  The pain starts to her right side and radiates to her right chest with associated nausea and some diaphoresis.   States she has been feeling shortness of breath with exertion for the past several weeks.  Denies any fevers.  No cough or runny nose.  Has had decreased appetite.  Patient recently hospitalized at [facility] long from 6/21-6/27 for dizziness, hypokalemia and right shoulder pain due to adhesive capsulitis.  Patient at that time had normal TSH, negative MRI brain.  She was noted to have low cortisol level in the hospital concerning for adrenal insufficiency.  ACTH stimulation test was ordered but patient wanted to postpone the lab outpatient.  This has still not been done.                                                                  Hospital Course   Patient was placed in observation for treatment and evaluation of weakness and right-sided chest pain.    For her atypical right-sided chest pain, patient was started on heparin drip per verbal consultation with cardiology in the ED.  Overnight patient did well with no further EKG changes and  troponin is remained low and stable at 6.  Patient was seen by cardiology in the morning who recommended follow-up with him as an outpatient with no further intervention needed.  Patient's weakness was thought to be secondary to dehydration with some acute kidney injury, hypokalemia and hypomagnesemia.  Patient was treated with IV fluid resuscitation with normalization of kidney function.  She was treated with potassium and magnesium supplementation.  Patient was evaluated by physical therapy who noted that she could walk about 50 feet with assistance and her rolling walker.  They felt she was safe to go home to her family and home PT which is already been set up.  Evaluation and plan was discussed with patient's son Heather Walker.  He noted that he has been trying to get her to eat but patient has been not very hungry.  I noted that patient would do much better if she kept adequately hydrated and if her potassium and magnesium stayed within normal limits.  Patient is  instructed to see her PCP in 1 week for check of her potassium, magnesium and creatinine and to make sure she is eating and drinking well and is adequately hydrated.  Of note patient may have some level of depression which should be evaluated and addressed as an outpatient.   Discharge diagnosis     Principal Problem:   Chest pain Active Problems:   Essential hypertension   Hypokalemia   Diabetes mellitus ([place])   Acute kidney injury ([place])   BMI 35.0-35.9,adult   Weakness    Discharge instructions    Discharge Instructions    Diet - low sodium heart healthy   Complete by: As directed    Discharge instructions   Complete by: As directed    1.  It is very important that you eat and drink at regular times.  When you came into the hospital you seem to be dehydrated which caused your kidney to not function as well.  Your potassium and magnesium were also low.  This is most likely from not eating very well.  It is very important that you eat and drink well. 2.  You must go see your PCP in 1 week to repeat your potassium and magnesium level and that your kidneys are functioning well to make sure that your adequately hydrated.   Increase activity slowly   Complete by: As directed       Discharge Medications   Allergies as of 04/16/2020      Reactions   Crestor [rosuvastatin Calcium] Anaphylaxis   Metformin And Related Diarrhea   Nausea and diarrhea   Atorvastatin Other (See Comments)   Neck stiffness   Codeine Itching      Medication List    TAKE these medications   amLODipine 10 MG tablet Commonly known as: NORVASC Take 1 tablet (10 mg total) by mouth daily.   Insulin Pen Needle 32G X 4 MM Misc Inject 1 Device into the skin in the morning and at bedtime. 2x daily   lisinopril 20 MG tablet Commonly known as: ZESTRIL Take 1 tablet (20 mg total) by mouth daily.   NovoLIN 70/30 FlexPen (70-30) 100 UNIT/ML KwikPen Generic drug: insulin isophane & regular human Inject 22  Units into the skin 2 (two) times daily with a meal.   ondansetron 4 MG disintegrating tablet Commonly known as: Zofran ODT Take 1 tablet (4 mg total) by mouth every 8 (eight) hours as needed for nausea or vomiting.   onetouch ultrasoft lancets Use as  instructed to test blood sugar 2 times daily V95.63   OneTouch Delica Lancets 87F Misc 1 Package by Does not apply route in the morning and at bedtime. Use as instructed 2 times daily E11.65   OneTouch Verio test strip Generic drug: glucose blood 1 each by Other route 2 (two) times daily. Use as instructed   Ozempic (0.25 or 0.5 MG/DOSE) 2 MG/1.5ML Sopn Generic drug: Semaglutide(0.25 or 0.5MG /DOS) Inject 0.5 mg into the skin once a week. What changed: when to take this   traZODone 50 MG tablet Commonly known as: DESYREL TAKE 1/2 TO 1 TABLET(25 TO 50 MG) BY MOUTH AT BEDTIME AS NEEDED FOR SLEEP What changed:   how much to take  how to take this  when to take this  reasons to take this  additional instructions   triamcinolone cream 0.1 % Commonly known as: KENALOG Apply 1 application topically 2 (two) times daily.        Follow-up Information    Heather Held, DO. Schedule an appointment as soon as possible for a visit in 1 week(s).   Specialty: Family Medicine Why: Please check creatinine, potassium and magnesium to make sure patient is hydrating adequately at home. Contact information: [address] [place] [address] [phone]               Major procedures and Radiology Reports - PLEASE review detailed and final reports thoroughly  -       DG Chest 2 View  Result Date: 04/15/2020 CLINICAL DATA:  Chest pain EXAM: CHEST - 2 VIEW COMPARISON:  04/06/2020 FINDINGS: The heart size and mediastinal contours are within normal limits. Both lungs are clear. The visualized skeletal structures are unremarkable. IMPRESSION: Normal study. Electronically Signed   By: Rolm Baptise M.D.   On:  04/15/2020 18:59   CT Chest W Contrast  Result Date: 04/06/2020 CLINICAL DATA:  Right-sided shoulder pain and chest pain. EXAM: CT CHEST, ABDOMEN, AND PELVIS WITH CONTRAST TECHNIQUE: Multidetector CT imaging of the chest, abdomen and pelvis was performed following the standard protocol during bolus administration of intravenous contrast. CONTRAST:  158mL OMNIPAQUE IOHEXOL 300 MG/ML  SOLN COMPARISON:  May 02, 2013 FINDINGS: CT CHEST FINDINGS Cardiovascular: No significant vascular findings. Normal heart size. No pericardial effusion. Mediastinum/Nodes: No enlarged mediastinal, hilar, or axillary lymph nodes. Thyroid gland, trachea, and esophagus demonstrate no significant findings. Lungs/Pleura: Lungs are clear. No pleural effusion or pneumothorax. Musculoskeletal: Multilevel degenerative changes seen throughout the thoracic spine. CT ABDOMEN PELVIS FINDINGS Hepatobiliary: No focal liver abnormality is seen. Status post cholecystectomy. No biliary dilatation. Pancreas: Unremarkable. No pancreatic ductal dilatation or surrounding inflammatory changes. Spleen: Normal in size without focal abnormality. Adrenals/Urinary Tract: Adrenal glands are unremarkable. Kidneys are normal, without renal calculi, focal lesion, or hydronephrosis. Bladder is unremarkable. Stomach/Bowel: Stomach is within normal limits. The appendix is not identified. No evidence of bowel wall thickening, distention, or inflammatory changes. Vascular/Lymphatic: No significant vascular findings are present. No enlarged abdominal or pelvic lymph nodes. Reproductive: Status post hysterectomy. No adnexal masses. Other: There is a 1.9 cm x 2.9 cm fat containing umbilical hernia. Musculoskeletal: Multilevel degenerative changes seen throughout the lumbar spine. IMPRESSION: 1. No CT evidence of acute intrathoracic or intra-abdominal pathology. 2. 1.9 cm x 2.9 cm fat containing umbilical hernia. 3. Evidence of prior cholecystectomy. Electronically Signed    By: Virgina Norfolk M.D.   On: 04/06/2020 15:30   MR BRAIN WO CONTRAST  Result Date: 04/11/2020 CLINICAL  DATA:  Syncope.  Abnormal neuro exam.  Severe dizziness. EXAM: MRI HEAD WITHOUT CONTRAST TECHNIQUE: Multiplanar, multiecho pulse sequences of the brain and surrounding structures were obtained without intravenous contrast. COMPARISON:  None. FINDINGS: Brain: No acute infarction, hemorrhage, hydrocephalus, extra-axial collection or mass lesion. Several small white matter hyperintensities bilaterally. Vascular: Normal arterial flow voids Skull and upper cervical spine: No focal skeletal lesion. Sinuses/Orbits: Mild mucosal edema paranasal sinuses. Negative orbit. Other: None IMPRESSION: No acute abnormality.  Mild chronic white matter changes. Electronically Signed   By: Franchot Gallo M.D.   On: 04/11/2020 10:15   CT Abdomen Pelvis W Contrast  Result Date: 04/06/2020 CLINICAL DATA:  Right-sided shoulder pain and right sided chest pain. EXAM: CT CHEST, ABDOMEN, AND PELVIS WITH CONTRAST TECHNIQUE: Multidetector CT imaging of the chest, abdomen and pelvis was performed following the standard protocol during bolus administration of intravenous contrast. CONTRAST:  158mL OMNIPAQUE IOHEXOL 300 MG/ML  SOLN COMPARISON:  None. FINDINGS: CT CHEST FINDINGS Cardiovascular: No significant vascular findings. Normal heart size. No pericardial effusion. Mediastinum/Nodes: No enlarged mediastinal, hilar, or axillary lymph nodes. Thyroid gland, trachea, and esophagus demonstrate no significant findings. Lungs/Pleura: Lungs are clear. No pleural effusion or pneumothorax. Musculoskeletal: Multilevel degenerative changes seen throughout the thoracic spine. CT ABDOMEN PELVIS FINDINGS Hepatobiliary: No focal liver abnormality is seen. Status post cholecystectomy. No biliary dilatation. Pancreas: Unremarkable. No pancreatic ductal dilatation or surrounding inflammatory changes. Spleen: Normal in size without focal abnormality.  Adrenals/Urinary Tract: Adrenal glands are unremarkable. Kidneys are normal, without renal calculi, focal lesion, or hydronephrosis. Bladder is unremarkable. Stomach/Bowel: Stomach is within normal limits. The appendix is not identified. No evidence of bowel wall thickening, distention, or inflammatory changes. Vascular/Lymphatic: No significant vascular findings are present. No enlarged abdominal or pelvic lymph nodes. Reproductive: Status post hysterectomy. No adnexal masses. Other: There is a 1.9 cm x 2.9 cm fat containing umbilical hernia. Musculoskeletal: Multilevel degenerative changes seen throughout the lumbar spine. IMPRESSION: 1. No CT evidence of acute intrathoracic or intra-abdominal pathology. 2. 1.9 cm x 2.9 cm fat containing umbilical hernia. 3. Evidence of prior cholecystectomy. Electronically Signed   By: Virgina Norfolk M.D.   On: 04/06/2020 15:30   MR SHOULDER RIGHT WO CONTRAST  Result Date: 04/08/2020 CLINICAL DATA:  Right shoulder and right scapular pain EXAM: MRI OF THE RIGHT SHOULDER WITHOUT CONTRAST TECHNIQUE: Multiplanar, multisequence MR imaging of the shoulder was performed. No intravenous contrast was administered. COMPARISON:  X-ray 09/04/2014 FINDINGS: Rotator cuff: Mild infraspinatus tendinosis with tiny rim rent tear of the mid insertional fibers (series 11, image 9). Supraspinatus and subscapularis tendons are intact with mild tendinosis. Intact teres minor. Muscles: No atrophy or abnormal signal of the muscles of the rotator cuff. Biceps long head: Intact. Mild fluid within the biceps tendon sheath. Acromioclavicular Joint: Mild-moderate arthropathy of the acromioclavicular joint. Trace subacromial/subdeltoid bursal edema without fluid collection. Glenohumeral Joint: No joint effusion. No chondral defect. Labrum: Grossly intact, but evaluation is limited by lack of intraarticular fluid. Bones:  No marrow abnormality, fracture or dislocation. Other: Infiltration of the anatomic  fat within the rotator interval. No soft tissue edema or fluid collection. IMPRESSION: 1. No acute osseous abnormality of the right shoulder. 2. Mild rotator cuff tendinosis with tiny rim rent tear of the infraspinatus tendon. 3. Findings which can be seen in the clinical setting of adhesive capsulitis. 4. Mild-moderate arthropathy of the acromioclavicular joint. Electronically Signed   By: Davina Poke D.O.   On: 04/08/2020 14:54   DG Chest [facility]  1 View  Result Date: 04/06/2020 CLINICAL DATA:  Shortness of breath. Additional provided: Patient reports pain starting in right shoulder which radiates under right breast and into chest, pain with breathing. EXAM: PORTABLE CHEST 1 VIEW COMPARISON:  Prior chest radiographs 05/13/2019 and earlier FINDINGS: Heart size within normal limits. There is no appreciable airspace consolidation. No frank pulmonary edema. No evidence of pleural effusion or pneumothorax. No acute bony abnormality identified. IMPRESSION: No evidence of acute cardiopulmonary abnormality. Electronically Signed   By: Kellie Simmering DO   On: 04/06/2020 13:20   DG FLUORO GUIDED NEEDLE PLC ASPIRATION/INJECTION LOC  Result Date: 04/09/2020 CLINICAL DATA:  Adhesive capsulitis EXAM: RIGHT SHOULDER INJECTION UNDER FLUOROSCOPY TECHNIQUE: An appropriate skin entrance site was determined. The site was marked, prepped with Betadine, draped in the usual sterile fashion, and infiltrated locally with buffered Lidocaine. 22 gauge spinal needle was advanced to the superomedial margin of the humeral head under intermittent fluoroscopy. 5 ml of Omnipaque 300 was then used to opacify the right shoulder capsule. Subsequently 3 mL of ropivacaine and 1.5 mL of Depo-Medrol were administered. No immediate complication. FLUOROSCOPY TIME:  Fluoroscopy Time:  18 seconds Radiation Exposure Index (if provided by the fluoroscopic device): 2.1 mGy FINDINGS: Injection of contrast after needle placement demonstrates  intra-articular location. IMPRESSION: Technically successful right shoulder steroid injection. Electronically Signed   By: Macy Mis M.D.   On: 04/09/2020 16:00   ECHOCARDIOGRAM COMPLETE  Result Date: 04/16/2020    ECHOCARDIOGRAM REPORT   Patient Name:   Heather Walker Date of Exam: 04/16/2020 Medical Rec #:  [id number]      Height:       63.0 in Accession #:    [id number]     Weight:       198.0 lb Date of Birth:  [birth year]      BSA:          1.925 m Patient Age:    89 years       BP:           142/96 mmHg Patient Gender: F              HR:           88 bpm. Exam Location:  [facility] Procedure: 2D Echo, Color Doppler and Cardiac Doppler Indications:     R07.9 Chest Pain  History:         Patient has prior history of Echocardiogram examinations. Risk                  Factors:Hypertension and Diabetes.  Sonographer:     Charmayne Sheer RDCS (AE) Referring Phys:  [id number] [place] Diagnosing Phys: Neoma Laming MD  Sonographer Comments: No subcostal window and suboptimal parasternal window. IMPRESSIONS  1. Left ventricular ejection fraction, by estimation, is 60 to 65%. The left ventricle has normal function. The left ventricle has no regional wall motion abnormalities. Left ventricular diastolic parameters are consistent with Grade I diastolic dysfunction (impaired relaxation).  2. Right ventricular systolic function is normal. The right ventricular size is normal.  3. Left atrial size was mildly dilated.  4. Right atrial size was mildly dilated.  5. The mitral valve is normal in structure. Trivial mitral valve regurgitation. No evidence of mitral stenosis.  6. The aortic valve is normal in structure. Aortic valve regurgitation is not visualized. No aortic stenosis is present.  7. The inferior vena cava is normal in size with greater than 50% respiratory variability, suggesting right  atrial pressure of 3 mmHg. FINDINGS  Left Ventricle: Left ventricular ejection fraction, by estimation, is 60 to 65%. The left  ventricle has normal function. The left ventricle has no regional wall motion abnormalities. The left ventricular internal cavity size was normal in size. There is  no left ventricular hypertrophy. Left ventricular diastolic parameters are consistent with Grade I diastolic dysfunction (impaired relaxation). Right Ventricle: The right ventricular size is normal. No increase in right ventricular wall thickness. Right ventricular systolic function is normal. Left Atrium: Left atrial size was mildly dilated. Right Atrium: Right atrial size was mildly dilated. Pericardium: There is no evidence of pericardial effusion. Mitral Valve: The mitral valve is normal in structure. Normal mobility of the mitral valve leaflets. Trivial mitral valve regurgitation. No evidence of mitral valve stenosis. MV peak gradient, 3.6 mmHg. The mean mitral valve gradient is 2.0 mmHg. Tricuspid Valve: The tricuspid valve is normal in structure. Tricuspid valve regurgitation is trivial. No evidence of tricuspid stenosis. Aortic Valve: The aortic valve is normal in structure. Aortic valve regurgitation is not visualized. No aortic stenosis is present. Aortic valve mean gradient measures 4.0 mmHg. Aortic valve peak gradient measures 7.3 mmHg. Aortic valve area, by VTI measures 2.23 cm. Pulmonic Valve: The pulmonic valve was normal in structure. Pulmonic valve regurgitation is not visualized. No evidence of pulmonic stenosis. Aorta: The aortic root is normal in size and structure. Venous: The inferior vena cava is normal in size with greater than 50% respiratory variability, suggesting right atrial pressure of 3 mmHg. IAS/Shunts: No atrial level shunt detected by color flow Doppler.  LEFT VENTRICLE PLAX 2D LVIDd:         3.68 cm  Diastology LVIDs:         2.61 cm  LV e' lateral:   7.51 cm/s LV PW:         0.99 cm  LV E/e' lateral: 7.3 LV IVS:        0.86 cm  LV e' medial:    4.79 cm/s LVOT diam:     1.90 cm  LV E/e' medial:  11.4 LV SV:         41  LV SV Index:   21 LVOT Area:     2.84 cm  RIGHT VENTRICLE RV Basal diam:  2.47 cm LEFT ATRIUM             Index      RIGHT ATRIUM          Index LA diam:        2.20 cm 1.14 cm/m RA Area:     7.16 cm LA Vol (A2C):   14.7 ml 7.63 ml/m RA Volume:   11.40 ml 5.92 ml/m LA Vol (A4C):   16.8 ml 8.73 ml/m LA Biplane Vol: 15.8 ml 8.21 ml/m  AORTIC VALVE                   PULMONIC VALVE AV Area (Vmax):    2.03 cm    PV Vmax:       1.08 m/s AV Area (Vmean):   2.01 cm    PV Vmean:      69.600 cm/s AV Area (VTI):     2.23 cm    PV VTI:        0.167 m AV Vmax:           135.00 cm/s PV Peak grad:  4.7 mmHg AV Vmean:          91.900 cm/s  PV Mean grad:  2.0 mmHg AV VTI:            0.182 m AV Peak Grad:      7.3 mmHg AV Mean Grad:      4.0 mmHg LVOT Vmax:         96.60 cm/s LVOT Vmean:        65.000 cm/s LVOT VTI:          0.143 m LVOT/AV VTI ratio: 0.79  AORTA Ao Root diam: 3.10 cm MITRAL VALVE MV Area (PHT): 4.29 cm    SHUNTS MV Peak grad:  3.6 mmHg    Systemic VTI:  0.14 m MV Mean grad:  2.0 mmHg    Systemic Diam: 1.90 cm MV Vmax:       0.95 m/s MV Vmean:      65.2 cm/s MV Decel Time: 177 msec MV E velocity: 54.80 cm/s MV A velocity: 78.80 cm/s MV E/A ratio:  0.70 Neoma Laming MD Electronically signed by Neoma Laming MD Signature Date/Time: 04/16/2020/1:55:43 PM    Final     Micro Results    No results found for this or any previous visit (from the past 240 hour(s)).  Today   Subjective    Heather Walker feels much improved since admission.  Feels ready to go home.  Denies chest pain, shortness of breath or abdominal pain.  Feels they can take care of themselves with the resources they have at home.  Objective   Blood pressure (!) 163/103, pulse 84, temperature 99.2 F (37.3 C), temperature source Oral, resp. rate 16, height 5\' 3"  (1.6 m), weight 89.8 kg, SpO2 99 %.   Intake/Output Summary (Last 24 hours) at 04/16/2020 1652 Last data filed at 04/16/2020 8270 Gross per 24 hour  Intake 138.01 ml  Output  700 ml  Net -561.99 ml    Exam General: Patient appears somewhat tired but is ingood spirits sitting up in bed in no acute distress.  Eyes: sclera anicteric, conjuctiva mild injection bilaterally CVS: S1-S2, regular  Respiratory:  decreased air entry bilaterally secondary to decreased inspiratory effort, rales at bases  GI: NABS, soft, NT  LE: No edema.  Neuro: A/O x 3, Moving all extremities equally with normal strength, CN 3-12 intact, grossly nonfocal.  Psych: patient is logical and coherent, judgement and insight appear normal, mood and affect seem somewhat sad.   Data Review   CBC w Diff:  Lab Results  Component Value Date   WBC 7.4 04/16/2020   HGB 13.4 04/16/2020   HCT 38.6 04/16/2020   PLT 341 04/16/2020   LYMPHOPCT 35.8 04/14/2020   MONOPCT 11.7 04/14/2020   EOSPCT 1.0 04/14/2020   BASOPCT 0.7 04/14/2020    CMP:  Lab Results  Component Value Date   NA 141 04/16/2020   K 3.5 04/16/2020   CL 105 04/16/2020   CO2 27 04/16/2020   BUN 13 04/16/2020   CREATININE 1.04 (H) 04/16/2020   PROT 8.4 (H) 04/14/2020   ALBUMIN 4.2 04/14/2020   BILITOT 0.4 04/14/2020   ALKPHOS 92 04/14/2020   AST 23 04/14/2020   ALT 32 04/14/2020  .   Total Time in preparing paper work, data evaluation and todays exam - 35 minutes  Vashti Hey M.D on 04/16/2020 at 4:52 PM  [facility]   Office  [phone]

## 2020-04-16 NOTE — Telephone Encounter (Signed)
Pt is in ER --- looks like they are admitting her

## 2020-04-16 NOTE — Consult Note (Addendum)
Heather Walker is a 59 y.o. female  [id number]  Primary Cardiologist: Neoma Laming Reason for Consultation: Chest Pain  HPI: Patient is a 59 year old female with past medical history of hypertension, IDDM, and asthma presenting to the emergency C department with generalized weakness.  While patient was in triage she developed right-sided chest pain with associated nausea and shortness of breath.  Patients peak troponin was 6 and has remained there.  Patient with initial EKG changes of flattening T waves in the in V3, 4, 5, and 6.  We have been consulted to evaluate the patient for chest pain   Review of Systems: Patient continues to have right-sided chest pain at this time as well as continue to be nauseous and short of breath.   Past Medical History:  Diagnosis Date   Arthritis    Asthma    Depression    Diabetes mellitus without complication (HCC)    Heart murmur    Hypertension     (Not in a hospital admission)     amLODipine  10 mg Oral Daily   aspirin EC  81 mg Oral Daily   insulin aspart  0-15 Units Subcutaneous TID WC   insulin aspart  0-5 Units Subcutaneous QHS   potassium chloride  40 mEq Oral Once    Infusions:  sodium chloride 75 mL/hr at 04/16/20 0207    Allergies  Allergen Reactions   Crestor [Rosuvastatin Calcium] Anaphylaxis   Metformin And Related Diarrhea    Nausea and diarrhea   Atorvastatin Other (See Comments)    Neck stiffness   Codeine Itching    Social History   Socioeconomic History   Marital status: Married    Spouse name: Not on file   Number of children: Not on file   Years of education: ged   Highest education level: Not on file  Occupational History   Occupation: disabled  Tobacco Use   Smoking status: Never Smoker   Smokeless tobacco: Never Used  Scientific laboratory technician Use: Never used  Substance and Sexual Activity   Alcohol use: No   Drug use: No   Sexual activity: Yes    Partners: Male    Birth control/protection:  Surgical  Other Topics Concern   Not on file  Social History Narrative   Not on file   Social Determinants of Health   Financial Resource Strain:    Difficulty of Paying Living Expenses:   Food Insecurity:    Worried About Charity fundraiser in the Last Year:    Arboriculturist in the Last Year:   Transportation Needs:    Film/video editor (Medical):    Lack of Transportation (Non-Medical):   Physical Activity:    Days of Exercise per Week:    Minutes of Exercise per Session:   Stress:    Feeling of Stress :   Social Connections:    Frequency of Communication with Friends and Family:    Frequency of Social Gatherings with Friends and Family:    Attends Religious Services:    Active Member of Clubs or Organizations:    Attends Music therapist:    Marital Status:   Intimate Partner Violence:    Fear of Current or Ex-Partner:    Emotionally Abused:    Physically Abused:    Sexually Abused:     Family History  Problem Relation Age of Onset   Hypertension Mother    Asthma Mother  Hypertension Sister    Asthma Sister    Diabetes Sister    Hypertension Maternal Grandmother    Heart defect Sister    Asthma Sister    Aneurysm Sister    Asthma Sister     PHYSICAL EXAM: Vitals:   04/16/20 0717 04/16/20 0900  BP: (!) 123/103 (!) 142/96  Pulse: 96 92  Resp: 20 17  Temp:    SpO2: 97% 96%     Intake/Output Summary (Last 24 hours) at 04/16/2020 0934 Last data filed at 04/16/2020 0512 Gross per 24 hour  Intake 107.54 ml  Output 700 ml  Net -592.46 ml    General:  Well appearing. No respiratory difficulty HEENT: normal Neck: supple. no JVD. Carotids 2+ bilat; no bruits. No lymphadenopathy or thryomegaly appreciated. Cor: PMI nondisplaced. Regular rate & rhythm. No rubs, gallops or murmurs. Lungs: clear Abdomen: soft, nontender, nondistended. No hepatosplenomegaly. No bruits or masses. Good bowel sounds. Extremities: no cyanosis, clubbing,  rash, edema Neuro: alert & oriented x 3, cranial nerves grossly intact. moves all 4 extremities w/o difficulty. Affect pleasant.  ECG: NSR with Lt anterior fasicular block and LVH. T wave flattening in V3, 4, 5, and 6. 88/BPM.  Results for orders placed or performed during the hospital encounter of 04/15/20 (from the past 24 hour(s))  Basic metabolic panel     Status: Abnormal   Collection Time: 04/15/20  6:10 PM  Result Value Ref Range   Sodium 144 135 - 145 mmol/L   Potassium 3.3 (L) 3.5 - 5.1 mmol/L   Chloride 104 98 - 111 mmol/L   CO2 31 22 - 32 mmol/L   Glucose, Bld 150 (H) 70 - 99 mg/dL   BUN 17 6 - 20 mg/dL   Creatinine, Ser 1.20 (H) 0.44 - 1.00 mg/dL   Calcium 9.1 8.9 - 10.3 mg/dL   GFR calc non Af Amer 50 (L) >60 mL/min   GFR calc Af Amer 58 (L) >60 mL/min   Anion gap 9 5 - 15  CBC     Status: None   Collection Time: 04/15/20  6:10 PM  Result Value Ref Range   WBC 7.4 4.0 - 10.5 K/uL   RBC 5.05 3.87 - 5.11 MIL/uL   Hemoglobin 14.1 12.0 - 15.0 g/dL   HCT 41.5 36 - 46 %   MCV 82.2 80.0 - 100.0 fL   MCH 27.9 26.0 - 34.0 pg   MCHC 34.0 30.0 - 36.0 g/dL   RDW 14.7 11.5 - 15.5 %   Platelets 345 150 - 400 K/uL   nRBC 0.0 0.0 - 0.2 %  Troponin I (High Sensitivity)     Status: None   Collection Time: 04/15/20  6:10 PM  Result Value Ref Range   Troponin I (High Sensitivity) 6 <18 ng/L  Protime-INR     Status: None   Collection Time: 04/15/20  7:45 PM  Result Value Ref Range   Prothrombin Time 12.7 11.4 - 15.2 seconds   INR 1.0 0.8 - 1.2  APTT     Status: None   Collection Time: 04/15/20  7:45 PM  Result Value Ref Range   aPTT 25 24 - 36 seconds  Troponin I (High Sensitivity)     Status: None   Collection Time: 04/15/20  8:46 PM  Result Value Ref Range   Troponin I (High Sensitivity) 6 <18 ng/L  Troponin I (High Sensitivity)     Status: None   Collection Time: 04/15/20 11:38 PM  Result Value Ref  Range   Troponin I (High Sensitivity) 6 <18 ng/L  Glucose, capillary      Status: Abnormal   Collection Time: 04/15/20 11:48 PM  Result Value Ref Range   Glucose-Capillary 129 (H) 70 - 99 mg/dL  Heparin level (unfractionated)     Status: Abnormal   Collection Time: 04/16/20  2:09 AM  Result Value Ref Range   Heparin Unfractionated 1.15 (H) 0.30 - 0.70 IU/mL  Troponin I (High Sensitivity)     Status: None   Collection Time: 04/16/20  2:09 AM  Result Value Ref Range   Troponin I (High Sensitivity) 6 <18 ng/L  CBC     Status: Abnormal   Collection Time: 04/16/20  4:56 AM  Result Value Ref Range   WBC 7.4 4.0 - 10.5 K/uL   RBC 4.88 3.87 - 5.11 MIL/uL   Hemoglobin 13.4 12.0 - 15.0 g/dL   HCT 38.6 36 - 46 %   MCV 79.1 (L) 80.0 - 100.0 fL   MCH 27.5 26.0 - 34.0 pg   MCHC 34.7 30.0 - 36.0 g/dL   RDW 14.6 11.5 - 15.5 %   Platelets 341 150 - 400 K/uL   nRBC 0.0 0.0 - 0.2 %  Urinalysis, Complete w Microscopic     Status: Abnormal   Collection Time: 04/16/20  5:15 AM  Result Value Ref Range   Color, Urine YELLOW (A) YELLOW   APPearance HAZY (A) CLEAR   Specific Gravity, Urine 1.015 1.005 - 1.030   pH 6.0 5.0 - 8.0   Glucose, UA NEGATIVE NEGATIVE mg/dL   Hgb urine dipstick NEGATIVE NEGATIVE   Bilirubin Urine NEGATIVE NEGATIVE   Ketones, ur NEGATIVE NEGATIVE mg/dL   Protein, ur NEGATIVE NEGATIVE mg/dL   Nitrite NEGATIVE NEGATIVE   Leukocytes,Ua NEGATIVE NEGATIVE   WBC, UA 0-5 0 - 5 WBC/hpf   Bacteria, UA RARE (A) NONE SEEN   Squamous Epithelial / LPF 0-5 0 - 5  Glucose, capillary     Status: Abnormal   Collection Time: 04/16/20  8:31 AM  Result Value Ref Range   Glucose-Capillary 141 (H) 70 - 99 mg/dL   DG Chest 2 View  Result Date: 04/15/2020 CLINICAL DATA:  Chest pain EXAM: CHEST - 2 VIEW COMPARISON:  04/06/2020 FINDINGS: The heart size and mediastinal contours are within normal limits. Both lungs are clear. The visualized skeletal structures are unremarkable. IMPRESSION: Normal study. Electronically Signed   By: Rolm Baptise M.D.   On: 04/15/2020  18:59     ASSESSMENT AND PLAN: Patient presenting to the emergency department with generalized weakness and later developed right-sided chest pain.  Patient did show EKG changes compared to previous hospital admission but no signs of emergent ischemia.  Patient has also had flat troponins of 6.  Patient does remain tachycardic and hypertensive and therefore will increase the patient's lisinopril and add low-dose metoprolol succinate.  Without evidence of worsening ischemia heparin drip will be discontinued. From cardiology point of view the patient is stable for discharge and we can see her in our office for further work up tomorrow at 10 AM. Agree with plan.  Adaline Sill NP-C

## 2020-04-16 NOTE — Evaluation (Signed)
Physical Therapy Evaluation Patient Details Name: Heather Walker MRN: [id number] DOB: 06/18/1961 Today's Date: 04/16/2020   History of Present Illness  Heather Walker is a 57yoF who comes [facility] on 6/30 with persistent weakness and CP. Pt was recently at [place] on 6/27 after fever/chills and Rt shoulder, noted to have Rt adhesive capulitis. While admitted pt had persistent hypoK+ and dizziness, MD recommending workup for adrenal insufficiency but patient opting to due this as an outpatient. Pt had a Rt shoulder steroid injection priot to that admission with recommendation to FU with OPPT. PMH: DM2, HTN. COVID PNA.  Clinical Impression  Pt admitted with above diagnosis. Pt currently with functional limitations due to the deficits listed below (see "PT Problem List"). Upon entry, pt in bed, lights out, awake and agreeable to participate. The pt is alert and oriented x4, pleasant, minimally conversational, and generally a good historian, but largely is subdue and flat of affect in most interactions. Speech could be considered delayed at times, soft spoken. Bed mobility with Modified independence, transfer and gait with minGuard assist and RW, slow but steady without gross LOB. Pt estimates confidently that she could AMB likely twice as far as her 74ft performance. Functional mobility assessment demonstrates increased effort/time requirements, fair tolerance, and need for physical assistance, whereas the patient performed these at a higher level of independence at her baseline. GLobally the patient's mobility is essentially unchanged since DC from [facility] earlier in week, but she has not commenced her HHPT nor her medical workup as an OP. Pt will benefit from skilled PT intervention to increase independence and safety with basic mobility in preparation for discharge to the venue listed below.       Follow Up Recommendations Supervision for mobility/OOB;Home health PT    Equipment Recommendations  None  recommended by PT    Recommendations for Other Services       Precautions / Restrictions Precautions Precautions: Fall Restrictions Weight Bearing Restrictions: No      Mobility  Bed Mobility Overal bed mobility: Modified Independent                Transfers Overall transfer level: Modified independent Equipment used: Rolling walker (2 wheeled) Transfers: Sit to/from Stand Sit to Stand: Min guard            Ambulation/Gait Ambulation/Gait assistance: Min guard Gait Distance (Feet): 50 Feet Assistive device: Rolling walker (2 wheeled)       General Gait Details: slow and guarded, but steady with RW, no LOB. SpO2 hypoxia alarm going off during AMB, suspect due to signal error.  Stairs            Wheelchair Mobility    Modified Rankin (Stroke Patients Only)       Balance Overall balance assessment: Modified Independent;Mild deficits observed, not formally tested                                           Pertinent Vitals/Pain Pain Assessment: No/denies pain    Home Living Family/patient expects to be discharged to:: Private residence Living Arrangements: Spouse/significant other Available Help at Discharge: Available 24 hours/day Type of Home: Apartment Home Access: Stairs to enter Entrance Stairs-Rails: Right Entrance Stairs-Number of Steps: 3 Home Layout: One level Home Equipment: Shower seat;Cane - single point;Wheelchair - power;Walker - 2 wheels Additional Comments: has used a power WC since 2015 due to "  cripling arthritis" in knees, back, hip (all non surgical) has not used since 2018.    Prior Function Level of Independence: Independent         Comments: Tolerates activity ad lib AMB without device, was having balance issues earlier in the year now resolved, no falls or close calls in past 12 months.     Hand Dominance   Dominant Hand: Right    Extremity/Trunk Assessment   Upper Extremity  Assessment Upper Extremity Assessment: Generalized weakness;Overall [facility] for tasks assessed    Lower Extremity Assessment Lower Extremity Assessment: Generalized weakness;Overall [facility] for tasks assessed    Cervical / Trunk Assessment Cervical / Trunk Assessment: Normal  Communication   Communication: No difficulties  Cognition Arousal/Alertness: Awake/alert Behavior During Therapy: Flat affect (labile, tearful at end of session, but smiling to avoid drawing attention to self.) Overall Cognitive Status: Within Functional Limits for tasks assessed                                        General Comments      Exercises     Assessment/Plan    PT Assessment Patient needs continued PT services  PT Problem List Decreased mobility;Decreased strength;Decreased range of motion;Decreased activity tolerance;Decreased balance;Decreased coordination;Decreased cognition       PT Treatment Interventions Gait training;Functional mobility training;Therapeutic activities;Therapeutic exercise;Patient/family education    PT Goals (Current goals can be found in the Care Plan section)  Acute Rehab PT Goals Patient Stated Goal: return home PT Goal Formulation: With patient Time For Goal Achievement: 04/30/20 Potential to Achieve Goals: Good    Frequency Min 2X/week   Barriers to discharge        Co-evaluation               AM-PAC PT "6 Clicks" Mobility  Outcome Measure Help needed turning from your back to your side while in a flat bed without using bedrails?: A Little Help needed moving from lying on your back to sitting on the side of a flat bed without using bedrails?: A Little Help needed moving to and from a bed to a chair (including a wheelchair)?: A Little Help needed standing up from a chair using your arms (e.g., wheelchair or bedside chair)?: A Little Help needed to walk in hospital room?: A Little Help needed climbing 3-5 steps with a railing? : A  Little 6 Click Score: 18    End of Session Equipment Utilized During Treatment: Gait belt Activity Tolerance: Patient tolerated treatment well Patient left: with call bell/phone within reach;in bed   PT Visit Diagnosis: Difficulty in walking, not elsewhere classified (R26.2);Other abnormalities of gait and mobility (R26.89);Unsteadiness on feet (R26.81);Muscle weakness (generalized) (M62.81)    Time: 5208-0223 PT Time Calculation (min) (ACUTE ONLY): 23 min   Charges:   PT Evaluation $PT Eval Moderate Complexity: 1 Mod          3:51 PM, 04/16/20 Etta Grandchild, PT, DPT Physical Therapist - [facility]  [phone] ([place])    Alto C 04/16/2020, 3:44 PM

## 2020-04-16 NOTE — Consult Note (Signed)
[facility] for Heparin Indication: chest pain/ACS  Allergies  Allergen Reactions  . Crestor [Rosuvastatin Calcium] Anaphylaxis  . Metformin And Related Diarrhea    Nausea and diarrhea  . Atorvastatin Other (See Comments)    Neck stiffness  . Codeine Itching    Patient Measurements: Height: 5\' 3"  (160 cm) Weight: 89.8 kg (198 lb) IBW/kg (Calculated) : 52.4 Heparin Dosing Weight: 72.8 kg  Vital Signs: Temp: 99.2 F (37.3 C) (06/30 1800) Temp Source: Oral (06/30 1800) BP: 131/97 (07/01 0330) Pulse Rate: 94 (07/01 0330)  Labs: Recent Labs    04/14/20 1601 04/15/20 1810 04/15/20 1810 04/15/20 1945 04/15/20 2046 04/15/20 2338 04/16/20 0209  HGB 14.6 14.1  --   --   --   --   --   HCT 44.2 41.5  --   --   --   --   --   PLT 369.0 345  --   --   --   --   --   APTT  --   --   --  25  --   --   --   LABPROT  --   --   --  12.7  --   --   --   INR  --   --   --  1.0  --   --   --   HEPARINUNFRC  --   --   --   --   --   --  1.15*  CREATININE 1.03 1.20*  --   --   --   --   --   TROPONINIHS  --  6   < >  --  6 6 6    < > = values in this interval not displayed.    Estimated Creatinine Clearance: 55 mL/min (A) (by C-G formula based on SCr of 1.2 mg/dL (H)).   Medical History: Past Medical History:  Diagnosis Date  . Arthritis   . Asthma   . Depression   . Diabetes mellitus without complication ([place])   . Heart murmur   . Hypertension     Medications:  (Not in a hospital admission)  Scheduled:  . amLODipine  10 mg Oral Daily  . aspirin EC  81 mg Oral Daily  . insulin aspart  0-15 Units Subcutaneous TID WC  . insulin aspart  0-5 Units Subcutaneous QHS  . potassium chloride  40 mEq Oral Once   Infusions:  . sodium chloride 75 mL/hr at 04/16/20 0207  . heparin     PRN:  Anti-infectives (From admission, onward)   None      Assessment: Pharmacy consulted to start heparin for ACS. No DOAC PTA. Baseline labs ordered.    Goal of Therapy:  Heparin level 0.3-0.7 units/ml Monitor platelets by anticoagulation protocol: Yes   Plan:  07/01 @ 0200 HL 1.15 supratherapeutic. Will hold drip for 1 hour and restart at 0515 at a rate of 700 units/hr and will recheck HL at 1100, per RN patient not having any complications of bleeding, will continue to monitor.  Tobie Lords, PharmD, BCPS 04/16/2020,3:57 AM

## 2020-04-16 NOTE — ED Notes (Signed)
Pt ambulated with assistance to restroom

## 2020-04-16 NOTE — Telephone Encounter (Signed)
noted 

## 2020-04-16 NOTE — ED Notes (Signed)
Pt denies chest pain at this time but HR elevated back to 132 while sitting in bed. No SOB.

## 2020-04-16 NOTE — Telephone Encounter (Signed)
Lft vm to return call 

## 2020-04-16 NOTE — ED Notes (Signed)
Eating breakfast at this time.

## 2020-04-16 NOTE — ED Notes (Signed)
PT at bedside.

## 2020-04-17 ENCOUNTER — Inpatient Hospital Stay: Payer: Medicare HMO | Admitting: Family Medicine

## 2020-04-17 LAB — ACTH: C206 ACTH: <5 pg/mL — ABNORMAL LOW (ref 6–50)

## 2020-04-17 NOTE — Telephone Encounter (Signed)
Pt informed of results.

## 2020-04-18 ENCOUNTER — Telehealth: Payer: Self-pay | Admitting: Internal Medicine

## 2020-04-18 MED ORDER — HYDROCORTISONE 5 MG PO TABS: ORAL_TABLET | ORAL | 1 refills | Status: DC

## 2020-04-18 NOTE — Telephone Encounter (Signed)
Attempted to call the pt on her listed phone # and emergency contact but the call would go directly to voice mail    Left a message with low ACTH and the need to prescribe hydrocortisone at this time.   A prescription of HC 5 mg, 2 tabs Q Am and 1 tab QPM between 2-4 pm  Was sent to the pharmacy.   Pt has a BP check at our office this coming Thursday and will discuss further    Abby Nena Jordan, MD  [facility]  [facility] [address] [place], [place] [address] Phone: [phone] FAX: [phone]

## 2020-04-22 ENCOUNTER — Other Ambulatory Visit: Payer: Self-pay

## 2020-04-22 ENCOUNTER — Encounter: Payer: Self-pay | Admitting: Neurology

## 2020-04-22 ENCOUNTER — Ambulatory Visit: Payer: Medicare HMO | Admitting: Neurology

## 2020-04-22 VITALS — BP 141/97 | HR 94 | Ht 63.0 in | Wt 196.0 lb

## 2020-04-22 DIAGNOSIS — R200 Anesthesia of skin: Secondary | ICD-10-CM | POA: Diagnosis not present

## 2020-04-22 DIAGNOSIS — R202 Paresthesia of skin: Secondary | ICD-10-CM | POA: Diagnosis not present

## 2020-04-22 DIAGNOSIS — R2689 Other abnormalities of gait and mobility: Secondary | ICD-10-CM | POA: Diagnosis not present

## 2020-04-22 NOTE — Patient Instructions (Signed)
I am not sure how to explain your balance issue and your feeling of being off balance while walking.  Your neurological exam is benign today.  Given that you have diabetes with at times suboptimal control and reports numbness and tingling in your toes, we can look for evidence of nerve damage with electrical nerve and muscle testing, called EMG and nerve conduction velocity testing.  We will set this test up through our office at a separate date and call you with the results.  Optimal diabetes control is key for prevention of nerve damage.  Your A1c has improved.  Please continue to follow-up with your primary care physician regarding diabetes control.  I am glad to hear that you are overall feeling better.  Please continue to use your walker at all times and I do agree that you may benefit from physical therapy.  We will call you with your EMG and nerve conduction velocity test results and follow-up in this office if needed.

## 2020-04-22 NOTE — Progress Notes (Signed)
Subjective:    Patient ID: Heather Walker is a 59 y.o. female.  HPI     Star Age, MD, PhD [facility] [address], [address] [place], [place] [address]  Dear Dr. Carollee Herter,   I saw your patient, Heather Walker, upon your kind request in my neurologic clinic today for initial consultation of her balance problem.  The patient is accompanied by her husband today.  As you know, [name] is a 59 year old right-handed woman with an underlying medical history of hypertension, hyperlipidemia, diabetes, depression, asthma, arthritis, and obesity, who reports Feeling off balance for the past month or 2.  She feels like sometimes she veers in 1 direction.  She feels like holding onto things inside her apartment, she has a walker available but has little room inside her apartment to use it.  She has not fallen.  She has had some numbness and tingling in both feet in the toe area, not so much the entire foot or bottom of the feet, no painful sensation thankfully.  She is working on better diabetes control.  She had physical therapy evaluation but has not had any actual physical therapy as she was hospitalized twice recently.  She has a 2 wheeled walker available but did not bring it today.  She tries to hydrate well with water.  She does not drink any alcohol, she is a non-smoker and does not drink caffeine daily.  She was given a prescription for trazodone as needed, this was when she was hospitalized, she does not take it nightly.  She has taken melatonin as needed for sleep. I reviewed your office note from 03/03/2020.  Her A1c has been elevated in the 2 digits in the recent past, latest number from 03/03/20 was 8.7.  She had a hospital admission in June for sepsis.  She was also admitted in late June till 04/16/2020 for chest pain and weakness.  She was felt to be dehydrated and had IV fluids.  She has been using a rolling walker.  She has had physical therapy.  She had a  brain MRI without contrast on 04/11/2020 and I reviewed the results: IMPRESSION: No acute abnormality.  Mild chronic white matter changes. Overall, she feels that her balance has improved a little bit.  She feels that her walking has improved. She denies any sudden onset of one-sided weakness or numbness.   Her Past Medical History Is Significant For: Past Medical History:  Diagnosis Date  . Arthritis   . Asthma   . Depression   . Diabetes mellitus without complication ([place])   . Heart murmur   . Hypertension     Her Past Surgical History Is Significant For: Past Surgical History:  Procedure Laterality Date  . ABDOMINAL HYSTERECTOMY    . APPENDECTOMY    . BLADDER SUSPENSION    . CESAREAN SECTION     3 previous  . TEE WITHOUT CARDIOVERSION N/A 08/24/2017   Procedure: TRANSESOPHAGEAL ECHOCARDIOGRAM (TEE);  Surgeon: Larey Dresser, MD;  Location: [facility] ENDOSCOPY;  Service: Cardiovascular;  Laterality: N/A;  . TUBAL LIGATION      Her Family History Is Significant For: Family History  Problem Relation Age of Onset  . Hypertension Mother   . Asthma Mother   . Hypertension Sister   . Asthma Sister   . Diabetes Sister   . Hypertension Maternal Grandmother   . Heart defect Sister   . Asthma Sister   . Aneurysm Sister   .  Asthma Sister     Her Social History Is Significant For: Social History   Socioeconomic History  . Marital status: Married    Spouse name: Not on file  . Number of children: Not on file  . Years of education: ged  . Highest education level: Not on file  Occupational History  . Occupation: disabled  Tobacco Use  . Smoking status: Never Smoker  . Smokeless tobacco: Never Used  Vaping Use  . Vaping Use: Never used  Substance and Sexual Activity  . Alcohol use: No  . Drug use: No  . Sexual activity: Yes    Partners: Male    Birth control/protection: Surgical  Other Topics Concern  . Not on file  Social History Narrative  . Not on file   Social  Determinants of Health   Financial Resource Strain:   . Difficulty of Paying Living Expenses:   Food Insecurity:   . Worried About Charity fundraiser in the Last Year:   . Arboriculturist in the Last Year:   Transportation Needs:   . Film/video editor (Medical):   Marland Kitchen Lack of Transportation (Non-Medical):   Physical Activity:   . Days of Exercise per Week:   . Minutes of Exercise per Session:   Stress:   . Feeling of Stress :   Social Connections:   . Frequency of Communication with Friends and Family:   . Frequency of Social Gatherings with Friends and Family:   . Attends Religious Services:   . Active Member of Clubs or Organizations:   . Attends Archivist Meetings:   Marland Kitchen Marital Status:     Her Allergies Are:  Allergies  Allergen Reactions  . Crestor [Rosuvastatin Calcium] Anaphylaxis  . Metformin And Related Diarrhea    Nausea and diarrhea  . Atorvastatin Other (See Comments)    Neck stiffness  . Codeine Itching  :   Her Current Medications Are:  Outpatient Encounter Medications as of 04/22/2020  Medication Sig  . amLODipine (NORVASC) 10 MG tablet Take 1 tablet (10 mg total) by mouth daily.  Marland Kitchen glucose blood (ONETOUCH VERIO) test strip 1 each by Other route 2 (two) times daily. Use as instructed  . hydrocortisone (CORTEF) 5 MG tablet Take 2 tablets (10 mg total) by mouth daily with breakfast AND 1 tablet (5 mg total) daily.  . insulin isophane & regular human (NOVOLIN 70/30 FLEXPEN) (70-30) 100 UNIT/ML KwikPen Inject 22 Units into the skin 2 (two) times daily with a meal.  . Insulin Pen Needle 32G X 4 MM MISC Inject 1 Device into the skin in the morning and at bedtime. 2x daily  . Lancets (ONETOUCH ULTRASOFT) lancets Use as instructed to test blood sugar 2 times daily E11.65  . lisinopril (ZESTRIL) 20 MG tablet Take 1 tablet (20 mg total) by mouth daily.  . ondansetron (ZOFRAN ODT) 4 MG disintegrating tablet Take 1 tablet (4 mg total) by mouth every 8  (eight) hours as needed for nausea or vomiting.  [name] Delica Lancets 57D MISC 1 Package by Does not apply route in the morning and at bedtime. Use as instructed 2 times daily E11.65  . Semaglutide,0.25 or 0.5MG /DOS, (OZEMPIC, 0.25 OR 0.5 MG/DOSE,) 2 MG/1.5ML SOPN Inject 0.5 mg into the skin once a week. (Patient taking differently: Inject 0.5 mg into the skin every Sunday. )  . traZODone (DESYREL) 50 MG tablet TAKE 1/2 TO 1 TABLET(25 TO 50 MG) BY MOUTH AT BEDTIME AS NEEDED  FOR SLEEP (Patient taking differently: Take 50-100 mg by mouth at bedtime as needed for sleep. )  . triamcinolone cream (KENALOG) 0.1 % Apply 1 application topically 2 (two) times daily.   No facility-administered encounter medications on file as of 04/22/2020.  :   Review of Systems:  Out of a complete 14 point review of systems, all are reviewed and negative with the exception of these symptoms as listed below: Review of Systems  Neurological:       Here for consult on difficulty walking. Pt reports she [facility] in the [facility] a [facility] Sunday and started having trouble walking. Pt sts she has not fall since these sx started but has felt unsteady on her feet.     Objective:  Neurological Exam  Physical Exam Physical Examination:   Vitals:   04/22/20 1418  BP: (!) 141/97  Pulse: 94    General Examination: The patient is a very pleasant 59 y.o. female in no acute distress. She appears well-developed and well-nourished and well groomed.   HEENT: Normocephalic, atraumatic, pupils are equal, round and reactive to light and accommodation. Extraocular tracking is good without limitation to gaze excursion or nystagmus noted. Normal smooth pursuit is noted. Hearing is grossly intact. Face is symmetric with normal facial animation and normal facial sensation. Speech is clear with no dysarthria noted. There is no hypophonia. There is no lip, neck/head, jaw or voice tremor. Neck is supple with full range of passive and active  motion. There are no carotid bruits on auscultation. Oropharynx exam reveals: moderate mouth dryness, adequate dental hygiene and moderate airway crowding. Tongue protrudes centrally and palate elevates symmetrically.    Chest: Clear to auscultation without wheezing, rhonchi or crackles noted.  Heart: S1+S2+0, regular and normal without murmurs, rubs or gallops noted.   Abdomen: Soft, non-tender and non-distended with normal bowel sounds appreciated on auscultation.  Extremities: There is no pitting edema in the distal lower extremities bilaterally. Pedal pulses are intact.  Skin: Warm and dry without trophic changes noted.  Musculoskeletal: exam reveals no obvious joint deformities, tenderness or joint swelling or erythema.   Neurologically:  Mental status: The patient is awake, alert and oriented in all 4 spheres. Her immediate and remote memory, attention, language skills and fund of knowledge are appropriate. There is no evidence of aphasia, agnosia, apraxia or anomia. Speech is clear with normal prosody and enunciation. Thought process is linear. Mood is normal and affect is normal.  Cranial nerves II - XII are as described above under HEENT exam. In addition: shoulder shrug is normal with equal shoulder height noted. Motor exam: Normal bulk, strength and tone is noted. There is no drift, tremor or rebound. Reflexes are 2+ throughout. Babinski: Toes are flexor bilaterally. Fine motor skills and coordination: intact with normal finger taps, normal hand movements, normal rapid alternating patting, normal foot taps and normal foot agility.  Cerebellar testing: No dysmetria or intention tremor on finger to nose testing. Heel to shin is unremarkable bilaterally. There is no truncal or gait ataxia.  Sensory exam: intact to light touch, pinprick, vibration, temperature sense in the upper and lower extremities.  Gait, station and balance: She stands without difficulty.  She stands slightly  wide-based, she walks slowly and insecurity, slightly wobbliness in her stance and walk, nonspecific changes, no shuffling, has preserved arm swing, no limp.   Assessment and Plan:    In summary, XYLA LEISNER is a very pleasant 59 y.o.-year old female with an underlying medical  history of hypertension, hyperlipidemia, diabetes, depression, asthma, arthritis, and obesity, who presents for evaluation of her balance problem.  On examination, she has a nonspecific gait change, walks slightly in securely but no obvious focal neurological finding.  She is at risk for diabetic neuropathy and reports some numbness and tingling in her toes, no painful sensation.  Thankfully, she has not had any falls.  Strength is exam is good, reflexes are preserved and sensory exam is actually also benign.  She is largely reassured.  She also had a brain MRI recently in late June when she was hospitalized, and her brain MRI did not show any acute findings, thankfully.  She had mild chronic white matter changes.  We talked about the importance of healthy lifestyle and ongoing optimization of diabetes control.  Given that she has had some symptoms concerning for underlying neuropathy, I suggested we proceed with an EMG nerve conduction velocity test through our office.  We will call her with results.  She is at risk for diabetic neuropathy and we talked about the importance of prevention.  Thankfully, she does not have any significant painful sensations.  For her gait disturbance and balance problem she has been evaluated by physical therapy but has not started actual therapy.  She is encouraged to follow through with this.  She is advised to use her walker at all times for gait safety.  We will keep her posted as to her test results by phone call and follow-up in this office as needed.  She is encouraged to stay well-hydrated with water.  I answered all the questions today and the patient and her husband were in agreement.    Thank  you very much for allowing me to participate in the care of this nice patient. If I can be of any further assistance to you please do not hesitate to call me at [phone].  Sincerely,   Star Age, MD, PhD

## 2020-04-23 ENCOUNTER — Ambulatory Visit: Payer: Medicare HMO

## 2020-04-23 VITALS — BP 140/80

## 2020-04-23 DIAGNOSIS — N289 Disorder of kidney and ureter, unspecified: Secondary | ICD-10-CM

## 2020-05-20 ENCOUNTER — Encounter: Payer: Self-pay | Admitting: Neurology

## 2020-06-02 ENCOUNTER — Ambulatory Visit: Payer: Medicare HMO | Admitting: Internal Medicine

## 2020-06-05 DIAGNOSIS — E119 Type 2 diabetes mellitus without complications: Secondary | ICD-10-CM | POA: Diagnosis not present

## 2020-06-05 DIAGNOSIS — H11159 Pinguecula, unspecified eye: Secondary | ICD-10-CM | POA: Diagnosis not present

## 2020-06-05 DIAGNOSIS — H5203 Hypermetropia, bilateral: Secondary | ICD-10-CM | POA: Diagnosis not present

## 2020-06-05 DIAGNOSIS — I10 Essential (primary) hypertension: Secondary | ICD-10-CM | POA: Diagnosis not present

## 2020-06-05 DIAGNOSIS — H52223 Regular astigmatism, bilateral: Secondary | ICD-10-CM | POA: Diagnosis not present

## 2020-06-05 DIAGNOSIS — H18419 Arcus senilis, unspecified eye: Secondary | ICD-10-CM | POA: Diagnosis not present

## 2020-06-05 DIAGNOSIS — H524 Presbyopia: Secondary | ICD-10-CM | POA: Diagnosis not present

## 2020-06-05 DIAGNOSIS — Z794 Long term (current) use of insulin: Secondary | ICD-10-CM | POA: Diagnosis not present

## 2020-06-05 LAB — HM DIABETES EYE EXAM

## 2020-06-08 ENCOUNTER — Encounter: Payer: Medicare HMO | Admitting: Neurology

## 2020-06-11 ENCOUNTER — Other Ambulatory Visit: Payer: Self-pay

## 2020-06-11 ENCOUNTER — Encounter: Payer: Self-pay | Admitting: Family Medicine

## 2020-06-11 ENCOUNTER — Ambulatory Visit (INDEPENDENT_AMBULATORY_CARE_PROVIDER_SITE_OTHER): Payer: Medicare HMO | Admitting: Family Medicine

## 2020-06-11 VITALS — BP 100/80 | HR 80 | Temp 98.3°F | Resp 18 | Ht 63.0 in | Wt 193.2 lb

## 2020-06-11 DIAGNOSIS — R5383 Other fatigue: Secondary | ICD-10-CM | POA: Diagnosis not present

## 2020-06-11 DIAGNOSIS — I10 Essential (primary) hypertension: Secondary | ICD-10-CM

## 2020-06-11 DIAGNOSIS — E876 Hypokalemia: Secondary | ICD-10-CM | POA: Diagnosis not present

## 2020-06-11 LAB — VITAMIN D 25 HYDROXY (VIT D DEFICIENCY, FRACTURES): Vit D, 25-Hydroxy: 12 ng/mL — ABNORMAL LOW (ref 30–100)

## 2020-06-11 LAB — CBC WITH DIFFERENTIAL/PLATELET
Absolute Monocytes: 367 cells/uL (ref 200–950)
Basophils Absolute: 31 cells/uL (ref 0–200)
Basophils Relative: 0.9 %
Eosinophils Absolute: 58 cells/uL (ref 15–500)
Eosinophils Relative: 1.7 %
HCT: 39.4 % (ref 35.0–45.0)
Hemoglobin: 12.7 g/dL (ref 11.7–15.5)
Lymphs Abs: 1295 cells/uL (ref 850–3900)
MCH: 27 pg (ref 27.0–33.0)
MCHC: 32.2 g/dL (ref 32.0–36.0)
MCV: 83.7 fL (ref 80.0–100.0)
MPV: 10.8 fL (ref 7.5–12.5)
Monocytes Relative: 10.8 %
Neutro Abs: 1649 cells/uL (ref 1500–7800)
Neutrophils Relative %: 48.5 %
Platelets: 267 10*3/uL (ref 140–400)
RBC: 4.71 10*6/uL (ref 3.80–5.10)
RDW: 15.5 % — ABNORMAL HIGH (ref 11.0–15.0)
Total Lymphocyte: 38.1 %
WBC: 3.4 10*3/uL — ABNORMAL LOW (ref 3.8–10.8)

## 2020-06-11 LAB — TSH: TSH: 1.63 mIU/L (ref 0.40–4.50)

## 2020-06-11 LAB — VITAMIN B12: Vitamin B-12: 264 pg/mL (ref 200–1100)

## 2020-06-11 MED ORDER — CLONIDINE HCL 0.1 MG PO TABS: 0.1000 mg | ORAL_TABLET | Freq: Two times a day (BID) | ORAL | 11 refills | Status: DC

## 2020-06-11 NOTE — Assessment & Plan Note (Signed)
Running low today Pt was not taking lisinopril --- it gave her a hA so she stopped it  She was taking maxzide--- which was not on her list Check labs --- stop maxzide--- lowered clonidine dose F/u 2 -3 weeks

## 2020-06-11 NOTE — Patient Instructions (Signed)
DASH Eating Plan DASH stands for "Dietary Approaches to Stop Hypertension." The DASH eating plan is a healthy eating plan that has been shown to reduce high blood pressure (hypertension). It may also reduce your risk for type 2 diabetes, heart disease, and stroke. The DASH eating plan may also help with weight loss. What are tips for following this plan?  General guidelines  Avoid eating more than 2,300 mg (milligrams) of salt (sodium) a day. If you have hypertension, you may need to reduce your sodium intake to 1,500 mg a day.  Limit alcohol intake to no more than 1 drink a day for nonpregnant women and 2 drinks a day for men. One drink equals 12 oz of beer, 5 oz of wine, or 1 oz of hard liquor.  Work with your health care provider to maintain a healthy body weight or to lose weight. Ask what an ideal weight is for you.  Get at least 30 minutes of exercise that causes your heart to beat faster (aerobic exercise) most days of the week. Activities may include walking, swimming, or biking.  Work with your health care provider or diet and nutrition specialist (dietitian) to adjust your eating plan to your individual calorie needs. Reading food labels   Check food labels for the amount of sodium per serving. Choose foods with less than 5 percent of the Daily Value of sodium. Generally, foods with less than 300 mg of sodium per serving fit into this eating plan.  To find whole grains, look for the word "whole" as the first word in the ingredient list. Shopping  Buy products labeled as "low-sodium" or "no salt added."  Buy fresh foods. Avoid canned foods and premade or frozen meals. Cooking  Avoid adding salt when cooking. Use salt-free seasonings or herbs instead of table salt or sea salt. Check with your health care provider or pharmacist before using salt substitutes.  Do not fry foods. Cook foods using healthy methods such as baking, boiling, grilling, and broiling instead.  Cook with  heart-healthy oils, such as olive, canola, soybean, or sunflower oil. Meal planning  Eat a balanced diet that includes: ? 5 or more servings of fruits and vegetables each day. At each meal, try to fill half of your plate with fruits and vegetables. ? Up to 6-8 servings of whole grains each day. ? Less than 6 oz of lean meat, poultry, or fish each day. A 3-oz serving of meat is about the same size as a deck of cards. One egg equals 1 oz. ? 2 servings of low-fat dairy each day. ? A serving of nuts, seeds, or beans 5 times each week. ? Heart-healthy fats. Healthy fats called Omega-3 fatty acids are found in foods such as flaxseeds and coldwater fish, like sardines, salmon, and mackerel.  Limit how much you eat of the following: ? Canned or prepackaged foods. ? Food that is high in trans fat, such as fried foods. ? Food that is high in saturated fat, such as fatty meat. ? Sweets, desserts, sugary drinks, and other foods with added sugar. ? Full-fat dairy products.  Do not salt foods before eating.  Try to eat at least 2 vegetarian meals each week.  Eat more home-cooked food and less restaurant, buffet, and fast food.  When eating at a restaurant, ask that your food be prepared with less salt or no salt, if possible. What foods are recommended? The items listed may not be a complete list. Talk with your dietitian about   what dietary choices are best for you. Grains Whole-grain or whole-wheat bread. Whole-grain or whole-wheat pasta. Brown rice. Oatmeal. Quinoa. Bulgur. Whole-grain and low-sodium cereals. Pita bread. Low-fat, low-sodium crackers. Whole-wheat flour tortillas. Vegetables Fresh or frozen vegetables (raw, steamed, roasted, or grilled). Low-sodium or reduced-sodium tomato and vegetable juice. Low-sodium or reduced-sodium tomato sauce and tomato paste. Low-sodium or reduced-sodium canned vegetables. Fruits All fresh, dried, or frozen fruit. Canned fruit in natural juice (without  added sugar). Meat and other protein foods Skinless chicken or turkey. Ground chicken or turkey. Pork with fat trimmed off. Fish and seafood. Egg whites. Dried beans, peas, or lentils. Unsalted nuts, nut butters, and seeds. Unsalted canned beans. Lean cuts of beef with fat trimmed off. Low-sodium, lean deli meat. Dairy Low-fat (1%) or fat-free (skim) milk. Fat-free, low-fat, or reduced-fat cheeses. Nonfat, low-sodium ricotta or cottage cheese. Low-fat or nonfat yogurt. Low-fat, low-sodium cheese. Fats and oils Soft margarine without trans fats. Vegetable oil. Low-fat, reduced-fat, or light mayonnaise and salad dressings (reduced-sodium). Canola, safflower, olive, soybean, and sunflower oils. Avocado. Seasoning and other foods Herbs. Spices. Seasoning mixes without salt. Unsalted popcorn and pretzels. Fat-free sweets. What foods are not recommended? The items listed may not be a complete list. Talk with your dietitian about what dietary choices are best for you. Grains Baked goods made with fat, such as croissants, muffins, or some breads. Dry pasta or rice meal packs. Vegetables Creamed or fried vegetables. Vegetables in a cheese sauce. Regular canned vegetables (not low-sodium or reduced-sodium). Regular canned tomato sauce and paste (not low-sodium or reduced-sodium). Regular tomato and vegetable juice (not low-sodium or reduced-sodium). Pickles. Olives. Fruits Canned fruit in a light or heavy syrup. Fried fruit. Fruit in cream or butter sauce. Meat and other protein foods Fatty cuts of meat. Ribs. Fried meat. Bacon. Sausage. Bologna and other processed lunch meats. Salami. Fatback. Hotdogs. Bratwurst. Salted nuts and seeds. Canned beans with added salt. Canned or smoked fish. Whole eggs or egg yolks. Chicken or turkey with skin. Dairy Whole or 2% milk, cream, and half-and-half. Whole or full-fat cream cheese. Whole-fat or sweetened yogurt. Full-fat cheese. Nondairy creamers. Whipped toppings.  Processed cheese and cheese spreads. Fats and oils Butter. Stick margarine. Lard. Shortening. Ghee. Bacon fat. Tropical oils, such as coconut, palm kernel, or palm oil. Seasoning and other foods Salted popcorn and pretzels. Onion salt, garlic salt, seasoned salt, table salt, and sea salt. Worcestershire sauce. Tartar sauce. Barbecue sauce. Teriyaki sauce. Soy sauce, including reduced-sodium. Steak sauce. Canned and packaged gravies. Fish sauce. Oyster sauce. Cocktail sauce. Horseradish that you find on the shelf. Ketchup. Mustard. Meat flavorings and tenderizers. Bouillon cubes. Hot sauce and Tabasco sauce. Premade or packaged marinades. Premade or packaged taco seasonings. Relishes. Regular salad dressings. Where to find more information:  National Heart, Lung, and Blood Institute: www.nhlbi.nih.gov  American Heart Association: www.heart.org Summary  The DASH eating plan is a healthy eating plan that has been shown to reduce high blood pressure (hypertension). It may also reduce your risk for type 2 diabetes, heart disease, and stroke.  With the DASH eating plan, you should limit salt (sodium) intake to 2,300 mg a day. If you have hypertension, you may need to reduce your sodium intake to 1,500 mg a day.  When on the DASH eating plan, aim to eat more fresh fruits and vegetables, whole grains, lean proteins, low-fat dairy, and heart-healthy fats.  Work with your health care provider or diet and nutrition specialist (dietitian) to adjust your eating plan to your   individual calorie needs. This information is not intended to replace advice given to you by your health care provider. Make sure you discuss any questions you have with your health care provider. Document Revised: 09/15/2017 Document Reviewed: 09/26/2016 Elsevier Patient Education  2020 [employer]  

## 2020-06-14 NOTE — Progress Notes (Signed)
Patient ID: Heather Walker, female    DOB: 20-Jul-1961  Age: 59 y.o. MRN: [id number]    Subjective:  Subjective  HPI Heather Walker presents for light headedness.  x 1 day.   She was concerned it may be her potassium  No cp, sob or palpitations  Review of Systems  Constitutional: Negative for appetite change, diaphoresis, fatigue and unexpected weight change.  Eyes: Negative for pain, redness and visual disturbance.  Respiratory: Negative for cough, chest tightness, shortness of breath and wheezing.   Cardiovascular: Negative for chest pain, palpitations and leg swelling.  Endocrine: Negative for cold intolerance, heat intolerance, polydipsia, polyphagia and polyuria.  Genitourinary: Negative for difficulty urinating, dysuria and frequency.  Neurological: Negative for dizziness, light-headedness, numbness and headaches.    History Past Medical History:  Diagnosis Date  . Arthritis   . Asthma   . Depression   . Diabetes mellitus without complication ([place])   . Heart murmur   . Hypertension     She has a past surgical history that includes Abdominal hysterectomy; Bladder suspension; Appendectomy; Cesarean section; Tubal ligation; and TEE without cardioversion (N/A, 08/24/2017).   Her family history includes Aneurysm in her sister; Asthma in her mother, sister, sister, and sister; Diabetes in her sister; Heart defect in her sister; Hypertension in her maternal grandmother, mother, and sister.She reports that she has never smoked. She has never used smokeless tobacco. She reports that she does not drink alcohol and does not use drugs.  Current Outpatient Medications on File Prior to Visit  Medication Sig Dispense Refill  . amLODipine (NORVASC) 10 MG tablet Take 1 tablet (10 mg total) by mouth daily. 30 tablet 0  . glucose blood (ONETOUCH VERIO) test strip 1 each by Other route 2 (two) times daily. Use as instructed 100 each 6  . hydrocortisone (CORTEF) 5 MG tablet Take 2 tablets (10 mg  total) by mouth daily with breakfast AND 1 tablet (5 mg total) daily. 90 tablet 1  . insulin isophane & regular human (NOVOLIN 70/30 FLEXPEN) (70-30) 100 UNIT/ML KwikPen Inject 22 Units into the skin 2 (two) times daily with a meal. 15 mL 6  . Insulin Pen Needle 32G X 4 MM MISC Inject 1 Device into the skin in the morning and at bedtime. 2x daily 100 each 6  . Lancets (ONETOUCH ULTRASOFT) lancets Use as instructed to test blood sugar 2 times daily E11.65 100 each 12  . ondansetron (ZOFRAN ODT) 4 MG disintegrating tablet Take 1 tablet (4 mg total) by mouth every 8 (eight) hours as needed for nausea or vomiting. 20 tablet 0  . OneTouch Delica Lancets 21H MISC 1 Package by Does not apply route in the morning and at bedtime. Use as instructed 2 times daily E11.65 100 each 6  . Semaglutide,0.25 or 0.5MG /DOS, (OZEMPIC, 0.25 OR 0.5 MG/DOSE,) 2 MG/1.5ML SOPN Inject 0.5 mg into the skin once a week. (Patient taking differently: Inject 0.5 mg into the skin every Sunday. ) 1 pen 6  . traZODone (DESYREL) 50 MG tablet TAKE 1/2 TO 1 TABLET(25 TO 50 MG) BY MOUTH AT BEDTIME AS NEEDED FOR SLEEP (Patient taking differently: Take 50-100 mg by mouth at bedtime as needed for sleep. ) 30 tablet 0  . triamcinolone cream (KENALOG) 0.1 % Apply 1 application topically 2 (two) times daily. 30 g 0   No current facility-administered medications on file prior to visit.     Objective:  Objective  Physical Exam Vitals and nursing note reviewed.  Constitutional:      Appearance: She is well-developed.  HENT:     Head: Normocephalic and atraumatic.  Eyes:     Conjunctiva/sclera: Conjunctivae normal.  Neck:     Thyroid: No thyromegaly.     Vascular: No carotid bruit or JVD.  Cardiovascular:     Rate and Rhythm: Normal rate and regular rhythm.     Heart sounds: Normal heart sounds. No murmur heard.   Pulmonary:     Effort: Pulmonary effort is normal. No respiratory distress.     Breath sounds: Normal breath sounds. No  wheezing or rales.  Chest:     Chest wall: No tenderness.  Musculoskeletal:     Cervical back: Normal range of motion and neck supple.  Neurological:     Mental Status: She is alert and oriented to person, place, and time.    BP 100/80 (BP Location: Left Arm, Patient Position: Sitting, Cuff Size: Large)   Pulse 80   Temp 98.3 F (36.8 C) (Oral)   Resp 18   Ht 5\' 3"  (1.6 m)   Wt 193 lb 3.2 oz (87.6 kg)   SpO2 97%   BMI 34.22 kg/m  Wt Readings from Last 3 Encounters:  06/11/20 193 lb 3.2 oz (87.6 kg)  04/22/20 196 lb (88.9 kg)  04/15/20 198 lb (89.8 kg)     Lab Results  Component Value Date   WBC 3.4 (L) 06/11/2020   HGB 12.7 06/11/2020   HCT 39.4 06/11/2020   PLT 267 06/11/2020   GLUCOSE 203 (H) 04/16/2020   CHOL 173 02/20/2019   TRIG 172.0 (H) 02/20/2019   HDL 51.90 02/20/2019   LDLDIRECT 115.0 08/02/2018   LDLCALC 87 02/20/2019   ALT 32 04/14/2020   AST 23 04/14/2020   NA 141 04/16/2020   K 3.5 04/16/2020   CL 105 04/16/2020   CREATININE 1.04 (H) 04/16/2020   BUN 13 04/16/2020   CO2 27 04/16/2020   TSH 1.63 06/11/2020   INR 1.0 04/15/2020   HGBA1C 8.7 (A) 03/03/2020   MICROALBUR <0.7 12/04/2019    ECHOCARDIOGRAM COMPLETE  Result Date: 04/16/2020    ECHOCARDIOGRAM REPORT   Patient Name:   Heather Walker Date of Exam: 04/16/2020 Medical Rec #:  [id number]      Height:       63.0 in Accession #:    [id number]     Weight:       198.0 lb Date of Birth:  May 02, 1961      BSA:          1.925 m Patient Age:    29 years       BP:           142/96 mmHg Patient Gender: F              HR:           88 bpm. Exam Location:  [facility] Procedure: 2D Echo, Color Doppler and Cardiac Doppler Indications:     R07.9 Chest Pain  History:         Patient has prior history of Echocardiogram examinations. Risk                  Factors:Hypertension and Diabetes.  Sonographer:     Charmayne Sheer RDCS (AE) Referring Phys:  [id number] [place] Diagnosing Phys: Neoma Laming MD  Sonographer Comments:  No subcostal window and suboptimal parasternal window. IMPRESSIONS  1. Left ventricular ejection fraction, by estimation, is 60 to 65%.  The left ventricle has normal function. The left ventricle has no regional wall motion abnormalities. Left ventricular diastolic parameters are consistent with Grade I diastolic dysfunction (impaired relaxation).  2. Right ventricular systolic function is normal. The right ventricular size is normal.  3. Left atrial size was mildly dilated.  4. Right atrial size was mildly dilated.  5. The mitral valve is normal in structure. Trivial mitral valve regurgitation. No evidence of mitral stenosis.  6. The aortic valve is normal in structure. Aortic valve regurgitation is not visualized. No aortic stenosis is present.  7. The inferior vena cava is normal in size with greater than 50% respiratory variability, suggesting right atrial pressure of 3 mmHg. FINDINGS  Left Ventricle: Left ventricular ejection fraction, by estimation, is 60 to 65%. The left ventricle has normal function. The left ventricle has no regional wall motion abnormalities. The left ventricular internal cavity size was normal in size. There is  no left ventricular hypertrophy. Left ventricular diastolic parameters are consistent with Grade I diastolic dysfunction (impaired relaxation). Right Ventricle: The right ventricular size is normal. No increase in right ventricular wall thickness. Right ventricular systolic function is normal. Left Atrium: Left atrial size was mildly dilated. Right Atrium: Right atrial size was mildly dilated. Pericardium: There is no evidence of pericardial effusion. Mitral Valve: The mitral valve is normal in structure. Normal mobility of the mitral valve leaflets. Trivial mitral valve regurgitation. No evidence of mitral valve stenosis. MV peak gradient, 3.6 mmHg. The mean mitral valve gradient is 2.0 mmHg. Tricuspid Valve: The tricuspid valve is normal in structure. Tricuspid valve  regurgitation is trivial. No evidence of tricuspid stenosis. Aortic Valve: The aortic valve is normal in structure. Aortic valve regurgitation is not visualized. No aortic stenosis is present. Aortic valve mean gradient measures 4.0 mmHg. Aortic valve peak gradient measures 7.3 mmHg. Aortic valve area, by VTI measures 2.23 cm. Pulmonic Valve: The pulmonic valve was normal in structure. Pulmonic valve regurgitation is not visualized. No evidence of pulmonic stenosis. Aorta: The aortic root is normal in size and structure. Venous: The inferior vena cava is normal in size with greater than 50% respiratory variability, suggesting right atrial pressure of 3 mmHg. IAS/Shunts: No atrial level shunt detected by color flow Doppler.  LEFT VENTRICLE PLAX 2D LVIDd:         3.68 cm  Diastology LVIDs:         2.61 cm  LV e' lateral:   7.51 cm/s LV PW:         0.99 cm  LV E/e' lateral: 7.3 LV IVS:        0.86 cm  LV e' medial:    4.79 cm/s LVOT diam:     1.90 cm  LV E/e' medial:  11.4 LV SV:         41 LV SV Index:   21 LVOT Area:     2.84 cm  RIGHT VENTRICLE RV Basal diam:  2.47 cm LEFT ATRIUM             Index      RIGHT ATRIUM          Index LA diam:        2.20 cm 1.14 cm/m RA Area:     7.16 cm LA Vol (A2C):   14.7 ml 7.63 ml/m RA Volume:   11.40 ml 5.92 ml/m LA Vol (A4C):   16.8 ml 8.73 ml/m LA Biplane Vol: 15.8 ml 8.21 ml/m  AORTIC VALVE  PULMONIC VALVE AV Area (Vmax):    2.03 cm    PV Vmax:       1.08 m/s AV Area (Vmean):   2.01 cm    PV Vmean:      69.600 cm/s AV Area (VTI):     2.23 cm    PV VTI:        0.167 m AV Vmax:           135.00 cm/s PV Peak grad:  4.7 mmHg AV Vmean:          91.900 cm/s PV Mean grad:  2.0 mmHg AV VTI:            0.182 m AV Peak Grad:      7.3 mmHg AV Mean Grad:      4.0 mmHg LVOT Vmax:         96.60 cm/s LVOT Vmean:        65.000 cm/s LVOT VTI:          0.143 m LVOT/AV VTI ratio: 0.79  AORTA Ao Root diam: 3.10 cm MITRAL VALVE MV Area (PHT): 4.29 cm    SHUNTS MV Peak  grad:  3.6 mmHg    Systemic VTI:  0.14 m MV Mean grad:  2.0 mmHg    Systemic Diam: 1.90 cm MV Vmax:       0.95 m/s MV Vmean:      65.2 cm/s MV Decel Time: 177 msec MV E velocity: 54.80 cm/s MV A velocity: 78.80 cm/s MV E/A ratio:  0.70 Neoma Laming MD Electronically signed by Neoma Laming MD Signature Date/Time: 04/16/2020/1:55:43 PM    Final      Assessment & Plan:  Plan  I have discontinued Connye Burkitt. Miguez's lisinopril. I am also having her start on cloNIDine. Additionally, I am having her maintain her OneTouch Verio, Insulin Pen Needle, onetouch ultrasoft, traZODone, OneTouch Delica Lancets 10G, Ozempic (0.25 or 0.5 MG/DOSE), NovoLIN 70/30 FlexPen, triamcinolone cream, amLODipine, ondansetron, and hydrocortisone.  Meds ordered this encounter  Medications  . cloNIDine (CATAPRES) 0.1 MG tablet    Sig: Take 1 tablet (0.1 mg total) by mouth 2 (two) times daily.    Dispense:  60 tablet    Refill:  11    Problem List Items Addressed This Visit      Unprioritized   Essential hypertension (Chronic)    Running low today Pt was not taking lisinopril --- it gave her a hA so she stopped it  She was taking maxzide--- which was not on her list Check labs --- stop maxzide--- lowered clonidine dose F/u 2 -3 weeks      Relevant Medications   cloNIDine (CATAPRES) 0.1 MG tablet   Hypokalemia   Relevant Orders   CBC with Differential/Platelet (Completed)   TSH (Completed)   Vitamin D (25 hydroxy) (Completed)   Vitamin B12 (Completed)    Other Visit Diagnoses    Other fatigue    -  Primary   Relevant Orders   CBC with Differential/Platelet (Completed)   TSH (Completed)   Vitamin D (25 hydroxy) (Completed)   Vitamin B12 (Completed)      Follow-up: Return in about 3 weeks (around 07/02/2020), or if symptoms worsen or fail to improve.  [name], DO

## 2020-06-18 ENCOUNTER — Other Ambulatory Visit: Payer: Self-pay

## 2020-06-18 MED ORDER — VITAMIN D (ERGOCALCIFEROL) 1.25 MG (50000 UNIT) PO CAPS: 50000.0000 [IU] | ORAL_CAPSULE | ORAL | 3 refills | Status: DC

## 2020-06-25 ENCOUNTER — Ambulatory Visit: Payer: Medicare HMO

## 2020-06-26 ENCOUNTER — Ambulatory Visit (INDEPENDENT_AMBULATORY_CARE_PROVIDER_SITE_OTHER): Payer: Medicare HMO

## 2020-06-26 ENCOUNTER — Other Ambulatory Visit: Payer: Self-pay

## 2020-06-26 DIAGNOSIS — E538 Deficiency of other specified B group vitamins: Secondary | ICD-10-CM | POA: Diagnosis not present

## 2020-06-26 MED ORDER — CYANOCOBALAMIN 1000 MCG/ML IJ SOLN
1000.0000 ug | Freq: Once | INTRAMUSCULAR | Status: AC
  Administered 2020-06-26: 1000 ug via INTRAMUSCULAR

## 2020-06-26 NOTE — Progress Notes (Signed)
Pt here for 1st.Weekly B12 injection per Dr. Carollee Herter.  B12 1054mcg given Left Deltoid , and pt tolerated injection well.  Next B12 injection scheduled for 07/03/20

## 2020-07-01 ENCOUNTER — Telehealth: Payer: Self-pay | Admitting: Pharmacist

## 2020-07-01 NOTE — Progress Notes (Signed)
Chronic Care Management Pharmacy Assistant   Name: Heather Walker  MRN: [id number] DOB: [birth year]  Reason for Encounter: Disease State  Patient Questions:  1.  Have you seen any other providers since your last visit? No  2.  Any changes in your medicines or health? Yes   PCP : [name], DO   Their chronic conditions include: Diabetes, Hypertension, Asthma, Depression  Office visits:  04-14-2020 (PCP) Patient is present in the office with Heather Walker for a Walker follow up.  Medication administered in office was promethazine. Provider ordered ondansetron for patient to start.   03-03-2020 (PCP) Patient present in office with Heather Walker for back pain.  Provider started patient on triamcinolone cream topically two times daily.   Provider ordered referral to neurology and physical therapy.  Consults: 04-22-2020 (Neurology) 04-15-2020 (Endocrinology) Patient is presented in the office with Heather Walker for a follow up on Walker visit.  Provider discontinued triamterene-hctz 75-50mg  daily.  Holding Ozempic 0.5 mg weekly for now. 03-03-2020 (Endocrinology) Patient is presented in office with Heather Walker for a follow up. Provider decrease patient's Novolin mix (70/30) 22 units before breakfast and supper.  Provider started Ozempic 0.25 mg weekly, if no side effects after 6 weeks increase to 0.5 mg weekly.  Hospitalizations: 04-15-2020 [facility]; [facility] Patient presented with chest pain.  No medication changes. 04-06-2020 [facility] Patient presented with chest pain.  Provider increased Norvasc to 10 mg daily, added lisinopril 10 mg daily and discontinued Clonidine.   Allergies:   Allergies  Allergen Reactions  . Crestor [Rosuvastatin Calcium] Anaphylaxis  . Metformin And Related Diarrhea    Nausea and diarrhea  . Atorvastatin Other (See Comments)    Neck stiffness  . Codeine Itching    Medications: Outpatient  Encounter Medications as of 07/01/2020  Medication Sig  . amLODipine (NORVASC) 10 MG tablet Take 1 tablet (10 mg total) by mouth daily.  . cloNIDine (CATAPRES) 0.1 MG tablet Take 1 tablet (0.1 mg total) by mouth 2 (two) times daily.  Marland Kitchen glucose blood (ONETOUCH VERIO) test strip 1 each by Other route 2 (two) times daily. Use as instructed  . hydrocortisone (CORTEF) 5 MG tablet Take 2 tablets (10 mg total) by mouth daily with breakfast AND 1 tablet (5 mg total) daily.  . insulin isophane & regular human (NOVOLIN 70/30 FLEXPEN) (70-30) 100 UNIT/ML KwikPen Inject 22 Units into the skin 2 (two) times daily with a meal.  . Insulin Pen Needle 32G X 4 MM Walker Inject 1 Device into the skin in the morning and at bedtime. 2x daily  . Lancets (ONETOUCH ULTRASOFT) lancets Use as instructed to test blood sugar 2 times daily E11.65  . ondansetron (ZOFRAN ODT) 4 MG disintegrating tablet Take 1 tablet (4 mg total) by mouth every 8 (eight) hours as needed for nausea or vomiting.  Heather Walker 1 Package by Does not apply route in the morning and at bedtime. Use as instructed 2 times daily E11.65  . Semaglutide,0.25 or 0.5MG /DOS, (OZEMPIC, 0.25 OR 0.5 MG/DOSE,) 2 MG/1.5ML SOPN Inject 0.5 mg into the skin once a week. (Patient taking differently: Inject 0.5 mg into the skin every Sunday. )  . traZODone (DESYREL) 50 MG tablet TAKE 1/2 TO 1 TABLET(25 TO 50 MG) BY MOUTH AT BEDTIME AS NEEDED FOR SLEEP (Patient taking differently: Take 50-100 mg by mouth at bedtime as needed for sleep. )  . triamcinolone cream (KENALOG) 0.1 %  Apply 1 application topically 2 (two) times daily.  . Vitamin D, Ergocalciferol, (DRISDOL) 1.25 MG (50000 UNIT) CAPS capsule Take 1 capsule (50,000 Units total) by mouth every 7 (seven) days.   No facility-administered encounter medications on file as of 07/01/2020.    Current Diagnosis: Patient Active Problem List   Diagnosis Date Noted  . Dizziness 04/15/2020  . BMI  35.0-35.9,adult 04/15/2020  . Weakness 04/15/2020  . Abnormal cortisol level 04/14/2020  . SIRS (systemic inflammatory response syndrome) ([place]) 04/06/2020  . Pneumonia due to COVID-19 virus 05/14/2019  . COVID-19 virus infection 05/13/2019  . Renal insufficiency 03/26/2019  . At risk for cardiovascular event 03/26/2019  . Allergic reaction due to correct medicinal substance properly administered 08/23/2018  . Hypertriglyceridemia 08/05/2018  . Asthma, chronic, unspecified asthma severity, uncomplicated 59/56/3875  . Chronic right shoulder pain 08/05/2018  . Muscle spasm 08/05/2018  . Insomnia 10/05/2017  . Sepsis ([place]) 08/21/2017  . Acute right hip pain 08/21/2017  . Preventative health care 11/20/2016  . Right ankle pain 05/09/2016  . Palpitations 12/08/2014  . DM (diabetes mellitus) type II uncontrolled, periph vascular disorder ([place]) 08/03/2014  . Leg wound, right 08/03/2014  . Hyperglycemia 07/13/2014  . Chest pain 07/12/2014  . Diabetes mellitus ([place]) 07/12/2014  . Acute kidney injury ([place]) 07/12/2014  . Abdominal pain, right upper quadrant 05/28/2014  . Tachycardia 05/03/2013  . Abscess 11/13/2012  . Breast pain, right 08/10/2012  . Osteoarthritis of left shoulder 03/04/2011  . DEPRESSION, MAJOR, RECURRENT, SEVERE 12/22/2009  . BACK PAIN, LUMBAR, WITH RADICULOPATHY 09/17/2009  . CALF PAIN, RIGHT 09/17/2009  . RENAL CYST 08/28/2009  . LOW BACK PAIN, ACUTE 11/13/2008  . FIBROIDS, UTERUS 09/10/2007  . UTI 09/03/2007  . BACTERIAL VAGINITIS 09/03/2007  . PELVIC PAIN, RIGHT 09/03/2007  . Hypokalemia 09/03/2007  . MOLE 06/21/2007  . ALLERGIC RHINITIS 06/21/2007  . HEMOCCULT POSITIVE STOOL 06/21/2007  . SINUSITIS, ACUTE NOS 04/05/2007  . REFLUX, ESOPHAGEAL 04/05/2007  . CHEST PAIN 04/05/2007  . Essential hypertension 03/19/2007  . ALLERGIC RHINITIS, SEASONAL 03/09/2007  . Asthma 03/09/2007    Goals Addressed   None    Recent Relevant Labs: Lab Results  Component  Value Date/Time   HGBA1C 8.7 (A) 03/03/2020 08:46 AM   HGBA1C 12.0 (A) 12/04/2019 02:41 PM   HGBA1C 11.5 (H) 02/20/2019 01:16 PM   HGBA1C 9.8 (H) 08/02/2018 03:25 PM   MICROALBUR <0.7 12/04/2019 03:07 PM   MICROALBUR 1.3 11/10/2016 01:07 PM    Kidney Function Lab Results  Component Value Date/Time   CREATININE 1.04 (H) 04/16/2020 12:14 PM   CREATININE 1.20 (H) 04/15/2020 06:10 PM   GFR 66.72 04/14/2020 04:01 PM   GFRNONAA 60 (L) 04/16/2020 12:14 PM   GFRAA >60 04/16/2020 12:14 PM     Chronic Care Management   Outreach Note  07/06/2020 Name: Heather Walker MRN: [id number] DOB: Sep 27, 1961  Referred by: [name], DO Reason for referral : Chronic Care Management   Third unsuccessful telephone outreach was attempted today. The patient was referred to the pharmacist for assistance with care management and care coordination.     Follow-Up:  Pharmacist Review and Scheduled Follow-Up With Clinical Pharmacist   [place] Shannon, [facility] [place] Pharmacist Assistant [phone]

## 2020-07-02 ENCOUNTER — Other Ambulatory Visit: Payer: Self-pay

## 2020-07-02 ENCOUNTER — Ambulatory Visit (INDEPENDENT_AMBULATORY_CARE_PROVIDER_SITE_OTHER): Payer: Medicare HMO | Admitting: Family Medicine

## 2020-07-02 VITALS — BP 140/90 | HR 80 | Temp 98.2°F | Resp 18 | Ht 63.0 in | Wt 197.4 lb

## 2020-07-02 DIAGNOSIS — R109 Unspecified abdominal pain: Secondary | ICD-10-CM | POA: Diagnosis not present

## 2020-07-02 DIAGNOSIS — I10 Essential (primary) hypertension: Secondary | ICD-10-CM

## 2020-07-02 LAB — POC URINALSYSI DIPSTICK (AUTOMATED)
Bilirubin, UA: NEGATIVE
Blood, UA: NEGATIVE
Glucose, UA: NEGATIVE
Ketones, UA: NEGATIVE
Leukocytes, UA: NEGATIVE
Nitrite, UA: NEGATIVE
Protein, UA: NEGATIVE
Spec Grav, UA: 1.015 (ref 1.010–1.025)
Urobilinogen, UA: 0.2 E.U./dL
pH, UA: 6 (ref 5.0–8.0)

## 2020-07-02 MED ORDER — CYCLOBENZAPRINE HCL 10 MG PO TABS: 10.0000 mg | ORAL_TABLET | Freq: Two times a day (BID) | ORAL | 0 refills | Status: DC | PRN

## 2020-07-02 MED ORDER — CYCLOBENZAPRINE HCL 10 MG PO TABS: 10.0000 mg | ORAL_TABLET | Freq: Two times a day (BID) | ORAL | 0 refills | Status: AC | PRN

## 2020-07-02 MED ORDER — AMLODIPINE BESYLATE 10 MG PO TABS: 10.0000 mg | ORAL_TABLET | Freq: Every day | ORAL | 1 refills | Status: DC

## 2020-07-02 NOTE — Progress Notes (Signed)
Patient ID: Heather Walker, female    DOB: [birth year]  Age: 59 y.o. MRN: [id number]    Subjective:  Subjective  HPI Heather Walker presents for f/u bp but also c/o R flank pain after moving some furniture    Review of Systems  Constitutional: Negative for appetite change, diaphoresis, fatigue and unexpected weight change.  Eyes: Negative for pain, redness and visual disturbance.  Respiratory: Negative for cough, chest tightness, shortness of breath and wheezing.   Cardiovascular: Negative for chest pain, palpitations and leg swelling.  Endocrine: Negative for cold intolerance, heat intolerance, polydipsia, polyphagia and polyuria.  Genitourinary: Negative for difficulty urinating, dysuria and frequency.  Neurological: Negative for dizziness, light-headedness, numbness and headaches.    History Past Medical History:  Diagnosis Date  . Arthritis   . Asthma   . Depression   . Diabetes mellitus without complication ([place])   . Heart murmur   . Hypertension     She has a past surgical history that includes Abdominal hysterectomy; Bladder suspension; Appendectomy; Cesarean section; Tubal ligation; and TEE without cardioversion (N/A, 08/24/2017).   Her family history includes Aneurysm in her sister; Asthma in her mother, sister, sister, and sister; Diabetes in her sister; Heart defect in her sister; Hypertension in her maternal grandmother, mother, and sister.She reports that she has never smoked. She has never used smokeless tobacco. She reports that she does not drink alcohol and does not use drugs.  Current Outpatient Medications on File Prior to Visit  Medication Sig Dispense Refill  . cloNIDine (CATAPRES) 0.1 MG tablet Take 1 tablet (0.1 mg total) by mouth 2 (two) times daily. 60 tablet 11  . glucose blood (ONETOUCH VERIO) test strip 1 each by Other route 2 (two) times daily. Use as instructed 100 each 6  . hydrocortisone (CORTEF) 5 MG tablet Take 2 tablets (10 mg total) by mouth daily  with breakfast AND 1 tablet (5 mg total) daily. 90 tablet 1  . insulin isophane & regular human (NOVOLIN 70/30 FLEXPEN) (70-30) 100 UNIT/ML KwikPen Inject 22 Units into the skin 2 (two) times daily with a meal. 15 mL 6  . Insulin Pen Needle 32G X 4 MM MISC Inject 1 Device into the skin in the morning and at bedtime. 2x daily 100 each 6  . Lancets (ONETOUCH ULTRASOFT) lancets Use as instructed to test blood sugar 2 times daily E11.65 100 each 12  . ondansetron (ZOFRAN ODT) 4 MG disintegrating tablet Take 1 tablet (4 mg total) by mouth every 8 (eight) hours as needed for nausea or vomiting. 20 tablet 0  . OneTouch Delica Lancets 27N MISC 1 Package by Does not apply route in the morning and at bedtime. Use as instructed 2 times daily E11.65 100 each 6  . Semaglutide,0.25 or 0.5MG /DOS, (OZEMPIC, 0.25 OR 0.5 MG/DOSE,) 2 MG/1.5ML SOPN Inject 0.5 mg into the skin once a week. (Patient taking differently: Inject 0.5 mg into the skin every Sunday. ) 1 pen 6  . traZODone (DESYREL) 50 MG tablet TAKE 1/2 TO 1 TABLET(25 TO 50 MG) BY MOUTH AT BEDTIME AS NEEDED FOR SLEEP (Patient taking differently: Take 50-100 mg by mouth at bedtime as needed for sleep. ) 30 tablet 0  . triamcinolone cream (KENALOG) 0.1 % Apply 1 application topically 2 (two) times daily. 30 g 0  . Vitamin D, Ergocalciferol, (DRISDOL) 1.25 MG (50000 UNIT) CAPS capsule Take 1 capsule (50,000 Units total) by mouth every 7 (seven) days. 4 capsule 3   No current facility-administered  medications on file prior to visit.     Objective:  Objective  Physical Exam Vitals and nursing note reviewed.  Constitutional:      Appearance: She is well-developed.  HENT:     Head: Normocephalic and atraumatic.  Eyes:     Conjunctiva/sclera: Conjunctivae normal.  Neck:     Thyroid: No thyromegaly.     Vascular: No carotid bruit or JVD.  Cardiovascular:     Rate and Rhythm: Normal rate and regular rhythm.     Heart sounds: Normal heart sounds. No murmur  heard.   Pulmonary:     Effort: Pulmonary effort is normal. No respiratory distress.     Breath sounds: Normal breath sounds. No wheezing or rales.  Chest:     Chest wall: No tenderness.  Musculoskeletal:     Cervical back: Normal range of motion and neck supple.  Neurological:     Mental Status: She is alert and oriented to person, place, and time.    BP 140/90 (BP Location: Left Arm, Patient Position: Sitting, Cuff Size: Large)   Pulse 80   Temp 98.2 F (36.8 C) (Oral)   Resp 18   Ht 5\' 3"  (1.6 m)   Wt 197 lb 6.4 oz (89.5 kg)   SpO2 98%   BMI 34.97 kg/m  Wt Readings from Last 3 Encounters:  07/02/20 197 lb 6.4 oz (89.5 kg)  06/11/20 193 lb 3.2 oz (87.6 kg)  04/22/20 196 lb (88.9 kg)     Lab Results  Component Value Date   WBC 3.4 (L) 06/11/2020   HGB 12.7 06/11/2020   HCT 39.4 06/11/2020   PLT 267 06/11/2020   GLUCOSE 203 (H) 04/16/2020   CHOL 173 02/20/2019   TRIG 172.0 (H) 02/20/2019   HDL 51.90 02/20/2019   LDLDIRECT 115.0 08/02/2018   LDLCALC 87 02/20/2019   ALT 32 04/14/2020   AST 23 04/14/2020   NA 141 04/16/2020   K 3.5 04/16/2020   CL 105 04/16/2020   CREATININE 1.04 (H) 04/16/2020   BUN 13 04/16/2020   CO2 27 04/16/2020   TSH 1.63 06/11/2020   INR 1.0 04/15/2020   HGBA1C 8.7 (A) 03/03/2020   MICROALBUR <0.7 12/04/2019    ECHOCARDIOGRAM COMPLETE  Result Date: 04/16/2020    ECHOCARDIOGRAM REPORT   Patient Name:   Heather Walker Date of Exam: 04/16/2020 Medical Rec #:  [id number]      Height:       63.0 in Accession #:    [id number]     Weight:       198.0 lb Date of Birth:  [birth year]      BSA:          1.925 m Patient Age:    70 years       BP:           142/96 mmHg Patient Gender: F              HR:           88 bpm. Exam Location:  [facility] Procedure: 2D Echo, Color Doppler and Cardiac Doppler Indications:     R07.9 Chest Pain  History:         Patient has prior history of Echocardiogram examinations. Risk                  Factors:Hypertension and  Diabetes.  Sonographer:     Charmayne Sheer RDCS (AE) Referring Phys:  [id number] [place] Diagnosing Phys: Neoma Laming  MD  Sonographer Comments: No subcostal window and suboptimal parasternal window. IMPRESSIONS  1. Left ventricular ejection fraction, by estimation, is 60 to 65%. The left ventricle has normal function. The left ventricle has no regional wall motion abnormalities. Left ventricular diastolic parameters are consistent with Grade I diastolic dysfunction (impaired relaxation).  2. Right ventricular systolic function is normal. The right ventricular size is normal.  3. Left atrial size was mildly dilated.  4. Right atrial size was mildly dilated.  5. The mitral valve is normal in structure. Trivial mitral valve regurgitation. No evidence of mitral stenosis.  6. The aortic valve is normal in structure. Aortic valve regurgitation is not visualized. No aortic stenosis is present.  7. The inferior vena cava is normal in size with greater than 50% respiratory variability, suggesting right atrial pressure of 3 mmHg. FINDINGS  Left Ventricle: Left ventricular ejection fraction, by estimation, is 60 to 65%. The left ventricle has normal function. The left ventricle has no regional wall motion abnormalities. The left ventricular internal cavity size was normal in size. There is  no left ventricular hypertrophy. Left ventricular diastolic parameters are consistent with Grade I diastolic dysfunction (impaired relaxation). Right Ventricle: The right ventricular size is normal. No increase in right ventricular wall thickness. Right ventricular systolic function is normal. Left Atrium: Left atrial size was mildly dilated. Right Atrium: Right atrial size was mildly dilated. Pericardium: There is no evidence of pericardial effusion. Mitral Valve: The mitral valve is normal in structure. Normal mobility of the mitral valve leaflets. Trivial mitral valve regurgitation. No evidence of mitral valve stenosis. MV peak  gradient, 3.6 mmHg. The mean mitral valve gradient is 2.0 mmHg. Tricuspid Valve: The tricuspid valve is normal in structure. Tricuspid valve regurgitation is trivial. No evidence of tricuspid stenosis. Aortic Valve: The aortic valve is normal in structure. Aortic valve regurgitation is not visualized. No aortic stenosis is present. Aortic valve mean gradient measures 4.0 mmHg. Aortic valve peak gradient measures 7.3 mmHg. Aortic valve area, by VTI measures 2.23 cm. Pulmonic Valve: The pulmonic valve was normal in structure. Pulmonic valve regurgitation is not visualized. No evidence of pulmonic stenosis. Aorta: The aortic root is normal in size and structure. Venous: The inferior vena cava is normal in size with greater than 50% respiratory variability, suggesting right atrial pressure of 3 mmHg. IAS/Shunts: No atrial level shunt detected by color flow Doppler.  LEFT VENTRICLE PLAX 2D LVIDd:         3.68 cm  Diastology LVIDs:         2.61 cm  LV e' lateral:   7.51 cm/s LV PW:         0.99 cm  LV E/e' lateral: 7.3 LV IVS:        0.86 cm  LV e' medial:    4.79 cm/s LVOT diam:     1.90 cm  LV E/e' medial:  11.4 LV SV:         41 LV SV Index:   21 LVOT Area:     2.84 cm  RIGHT VENTRICLE RV Basal diam:  2.47 cm LEFT ATRIUM             Index      RIGHT ATRIUM          Index LA diam:        2.20 cm 1.14 cm/m RA Area:     7.16 cm LA Vol (A2C):   14.7 ml 7.63 ml/m RA Volume:   11.40 ml  5.92 ml/m LA Vol (A4C):   16.8 ml 8.73 ml/m LA Biplane Vol: 15.8 ml 8.21 ml/m  AORTIC VALVE                   PULMONIC VALVE AV Area (Vmax):    2.03 cm    PV Vmax:       1.08 m/s AV Area (Vmean):   2.01 cm    PV Vmean:      69.600 cm/s AV Area (VTI):     2.23 cm    PV VTI:        0.167 m AV Vmax:           135.00 cm/s PV Peak grad:  4.7 mmHg AV Vmean:          91.900 cm/s PV Mean grad:  2.0 mmHg AV VTI:            0.182 m AV Peak Grad:      7.3 mmHg AV Mean Grad:      4.0 mmHg LVOT Vmax:         96.60 cm/s LVOT Vmean:        65.000  cm/s LVOT VTI:          0.143 m LVOT/AV VTI ratio: 0.79  AORTA Ao Root diam: 3.10 cm MITRAL VALVE MV Area (PHT): 4.29 cm    SHUNTS MV Peak grad:  3.6 mmHg    Systemic VTI:  0.14 m MV Mean grad:  2.0 mmHg    Systemic Diam: 1.90 cm MV Vmax:       0.95 m/s MV Vmean:      65.2 cm/s MV Decel Time: 177 msec MV E velocity: 54.80 cm/s MV A velocity: 78.80 cm/s MV E/A ratio:  0.70 Neoma Laming MD Electronically signed by Neoma Laming MD Signature Date/Time: 04/16/2020/1:55:43 PM    Final      Assessment & Plan:  Plan  I am having Heather Walker maintain her OneTouch Verio, Insulin Pen Needle, onetouch ultrasoft, traZODone, OneTouch Delica Lancets 14G, Ozempic (0.25 or 0.5 MG/DOSE), NovoLIN 70/30 FlexPen, triamcinolone cream, ondansetron, hydrocortisone, cloNIDine, Vitamin D (Ergocalciferol), cyclobenzaprine, and amLODipine.  Meds ordered this encounter  Medications  . DISCONTD: cyclobenzaprine (FLEXERIL) 10 MG tablet    Sig: Take 1 tablet (10 mg total) by mouth 2 (two) times daily as needed for up to 10 days for muscle spasms.    Dispense:  20 tablet    Refill:  0  . cyclobenzaprine (FLEXERIL) 10 MG tablet    Sig: Take 1 tablet (10 mg total) by mouth 2 (two) times daily as needed for up to 10 days for muscle spasms.    Dispense:  20 tablet    Refill:  0  . amLODipine (NORVASC) 10 MG tablet    Sig: Take 1 tablet (10 mg total) by mouth daily.    Dispense:  90 tablet    Refill:  1    Problem List Items Addressed This Visit      Unprioritized   Essential hypertension (Chronic)    Well controlled, no changes to meds. Encouraged heart healthy diet such as the DASH diet and exercise as tolerated.  Pt has sig pain today---- bp running lower at home      Relevant Medications   amLODipine (NORVASC) 10 MG tablet   Flank pain - Primary    Check urine --- neg ua  Muscle relaxers Ice/ heat alternating  If no improvement ---- ortho / sport med  Relevant Medications   cyclobenzaprine (FLEXERIL)  10 MG tablet   Other Relevant Orders   POCT Urinalysis Dipstick (Automated) (Completed)      Follow-up: Return in about 3 months (around 10/01/2020) for hypertension.  [name], DO

## 2020-07-02 NOTE — Patient Instructions (Signed)
Flank Pain, Adult Flank pain is pain that is located on the side of the body between the upper abdomen and the back. This area is called the flank. The pain may occur over a short period of time (acute), or it may be long-term or recurring (chronic). It may be mild or severe. Flank pain can be caused by many things, including:  Muscle soreness or injury.  Kidney stones or kidney disease.  Stress.  A disease of the spine (vertebral disk disease).  A lung infection (pneumonia).  Fluid around the lungs (pulmonary edema).  A skin rash caused by the chickenpox virus (shingles).  Tumors that affect the back of the abdomen.  Gallbladder disease. Follow these instructions at home:   Drink enough fluid to keep your urine clear or pale yellow.  Rest as told by your health care provider.  Take over-the-counter and prescription medicines only as told by your health care provider.  Keep a journal to track what has caused your flank pain and what has made it feel better.  Keep all follow-up visits as told by your health care provider. This is important. Contact a health care provider if:  Your pain is not controlled with medicine.  You have new symptoms.  Your pain gets worse.  You have a fever.  Your symptoms last longer than 2-3 days.  You have trouble urinating or you are urinating very frequently. Get help right away if:  You have trouble breathing or you are short of breath.  Your abdomen hurts or it is swollen or red.  You have nausea or vomiting.  You feel faint or you pass out.  You have blood in your urine. Summary  Flank pain is pain that is located on the side of the body between the upper abdomen and the back.  The pain may occur over a short period of time (acute), or it may be long-term or recurring (chronic). It may be mild or severe.  Flank pain can be caused by many things.  Contact your health care provider if your symptoms get worse or they last  longer than 2-3 days. This information is not intended to replace advice given to you by your health care provider. Make sure you discuss any questions you have with your health care provider. Document Revised: 09/15/2017 Document Reviewed: 12/16/2016 Elsevier Patient Education  2020 Elsevier Inc.  

## 2020-07-03 ENCOUNTER — Ambulatory Visit: Payer: Medicare HMO

## 2020-07-03 ENCOUNTER — Encounter: Payer: Self-pay | Admitting: Family Medicine

## 2020-07-03 NOTE — Assessment & Plan Note (Signed)
Well controlled, no changes to meds. Encouraged heart healthy diet such as the DASH diet and exercise as tolerated.  Pt has sig pain today---- bp running lower at home

## 2020-07-03 NOTE — Assessment & Plan Note (Signed)
Check urine --- neg ua  Muscle relaxers Ice/ heat alternating  If no improvement ---- ortho / sport med

## 2020-07-08 ENCOUNTER — Telehealth: Payer: Self-pay

## 2020-07-08 ENCOUNTER — Ambulatory Visit (INDEPENDENT_AMBULATORY_CARE_PROVIDER_SITE_OTHER): Payer: Medicare HMO | Admitting: Neurology

## 2020-07-08 ENCOUNTER — Ambulatory Visit: Payer: Medicare HMO | Admitting: Neurology

## 2020-07-08 ENCOUNTER — Other Ambulatory Visit: Payer: Self-pay

## 2020-07-08 DIAGNOSIS — R202 Paresthesia of skin: Secondary | ICD-10-CM | POA: Diagnosis not present

## 2020-07-08 DIAGNOSIS — R2689 Other abnormalities of gait and mobility: Secondary | ICD-10-CM

## 2020-07-08 DIAGNOSIS — R200 Anesthesia of skin: Secondary | ICD-10-CM

## 2020-07-08 DIAGNOSIS — Z0289 Encounter for other administrative examinations: Secondary | ICD-10-CM

## 2020-07-08 NOTE — Telephone Encounter (Signed)
I called the pt and left a vm advising of results ( ok per dpr). Pt was advised at this point she could f/u with PCP and to call back if she had any questions/ concerns.

## 2020-07-08 NOTE — Progress Notes (Signed)
Please call and advise the patient that the recent EMG and nerve conduction velocity test, which is the electrical nerve and muscle test we we performed, was reported as within normal limits. We checked for abnormal electrical discharges in the muscles or nerves and the report suggested normal findings. No further action is required on this test at this time. Please remind patient to keep any upcoming appointments or tests and to call us with any interim questions, concerns, problems or updates. Thanks,  Simaya Lumadue, MD, PhD 

## 2020-07-08 NOTE — Telephone Encounter (Signed)
-----   Message from Star Age, MD sent at 07/08/2020  8:51 AM EDT ----- Please call and advise the patient that the recent EMG and nerve conduction velocity test, which is the electrical nerve and muscle test we we performed, was reported as within normal limits. We checked for abnormal electrical discharges in the muscles or nerves and the report suggested normal findings. No further action is required on this test at this time. Please remind patient to keep any upcoming appointments or tests and to call us with any interim questions, concerns, problems or updates. Thanks,  Star Age, MD, PhD

## 2020-07-08 NOTE — Procedures (Signed)
Full Name: Kirby Argueta Gender: Female MRN #: [id number] Date of Birth: [birth year]    Visit Date: 07/08/2020 08:02 Age: 59 Years Examining Physician: Marcial Pacas, MD  Referring Physician: Star Age, MD Height: 5 feet 3 inch Patient History: 197lbs History: 58 years old female, with history of diabetes, presenting with bilateral toes paresthesia since July 2021, symmetric, no weakness  On examination: Bilateral lower extremity motor strength is normal, preserved toe vibratory sensation, trace ankle reflex  Summary of the test  Nerve conduction study:  Bilateral sural, superficial peroneal sensory responses were normal.  Bilateral tibial, peroneal to EDB motor responses were normal  Electromyography:  Selected needle examination of right lower extremity muscles and right lumbosacral paraspinal muscles were normal.   Conclusion: This is a normal study.  There is no electrodiagnostic evidence of large fiber peripheral neuropathy, but current study could not rule out the possibility of small fiber neuropathy due to intrinsic technical limitations.    ------------------------------- Marcial Pacas, M.D. PhD  [facility] [address] [place], [place] [address] Tel: [phone] Fax: [phone]  Verbal informed consent was obtained from the patient, patient was informed of potential risk of procedure, including bruising, bleeding, hematoma formation, infection, muscle weakness, muscle pain, numbness, among others.        [place]    Nerve / Sites Muscle Latency Ref. Amplitude Ref. Rel Amp Segments Distance Velocity Ref. Area    ms ms mV mV %  cm m/s m/s mVms  R Peroneal - EDB     Ankle EDB 4.4 ?6.5 5.5 ?2.0 100 Ankle - EDB 9   16.5     Fib head EDB 9.5  5.3  96.4 Fib head - Ankle 26 52 ?44 16.1     Pop fossa EDB 11.4  5.2  98.4 Pop fossa - Fib head 10 52 ?44 16.3         Pop fossa - Ankle      L Peroneal - EDB     Ankle EDB 4.3 ?6.5 4.8 ?2.0 100  Ankle - EDB 9   16.5     Fib head EDB 9.5  5.1  106 Fib head - Ankle 25 48 ?44 18.2     Pop fossa EDB 11.6  4.9  97.5 Pop fossa - Fib head 10 48 ?44 16.1         Pop fossa - Ankle      R Tibial - AH     Ankle AH 4.2 ?5.8 9.3 ?4.0 100 Ankle - AH 9   19.6     Pop fossa AH 11.7  7.5  80.2 Pop fossa - Ankle 35 47 ?41 17.7  L Tibial - AH     Ankle AH 4.9 ?5.8 11.1 ?4.0 100 Ankle - AH 9   25.0     Pop fossa AH 12.2  9.3  83.8 Pop fossa - Ankle 36 49 ?41 24.6             SNC    Nerve / Sites Rec. Site Peak Lat Ref.  Amp Ref. Segments Distance    ms ms V V  cm  R Sural - Ankle (Calf)     Calf Ankle 3.4 ?4.4 8 ?6 Calf - Ankle 14  L Sural - Ankle (Calf)     Calf Ankle 3.5 ?4.4 8 ?6 Calf - Ankle 14  R Superficial peroneal - Ankle     Lat leg Ankle 3.5 ?  4.4 8 ?6 Lat leg - Ankle 14  L Superficial peroneal - Ankle     Lat leg Ankle 3.7 ?4.4 6 ?6 Lat leg - Ankle 14             F  Wave    Nerve F Lat Ref.   ms ms  R Tibial - AH 50.1 ?56.0  L Tibial - AH 52.7 ?56.0         EMG Summary Table    Spontaneous MUAP Recruitment  Muscle IA Fib PSW Fasc Other Amp Dur. Poly Pattern  R. Tibialis anterior Normal None None None _______ Normal Normal Normal Normal  R. Tibialis posterior Normal None None None _______ Normal Normal Normal Normal  R. Peroneus longus Normal None None None _______ Normal Normal Normal Normal  R. Gastrocnemius (Medial head) Normal None None None _______ Normal Normal Normal Normal  R. Vastus lateralis Normal None None None _______ Normal Normal Normal Normal  R. Lumbar paraspinals (low) Normal None None None _______ Normal Normal Normal Normal  R. Lumbar paraspinals (mid) Normal None None None _______ Normal Normal Normal Normal

## 2020-07-15 ENCOUNTER — Telehealth: Payer: Self-pay | Admitting: Pharmacist

## 2020-07-15 NOTE — Progress Notes (Addendum)
Verified Adherence Gap Information. Per insurance data, the patient is 100% HTN compliance. Amlodipine 10 mg tab 07-02-20 90 day supply with one refill Patient has not met compliance for Statin therapy due to no statin use. [employer] Medicare data through 06/11/20.  [place] Shannon, [facility] [place] Pharmacist Assistant [phone]  Need to address statin intolerances (appropriate diagnosis codes) to determine if patient should be measured within the statin metric.  Reviewed by: De Blanch, PharmD Clinical Pharmacist [facility] [phone]

## 2020-07-16 ENCOUNTER — Encounter: Payer: Self-pay | Admitting: Family Medicine

## 2020-07-16 ENCOUNTER — Other Ambulatory Visit: Payer: Self-pay

## 2020-07-16 ENCOUNTER — Ambulatory Visit (INDEPENDENT_AMBULATORY_CARE_PROVIDER_SITE_OTHER): Payer: Medicare HMO | Admitting: Family Medicine

## 2020-07-16 VITALS — BP 116/88 | HR 90 | Temp 98.7°F | Resp 18 | Ht 63.0 in | Wt 198.8 lb

## 2020-07-16 DIAGNOSIS — E785 Hyperlipidemia, unspecified: Secondary | ICD-10-CM | POA: Diagnosis not present

## 2020-07-16 DIAGNOSIS — G4700 Insomnia, unspecified: Secondary | ICD-10-CM

## 2020-07-16 DIAGNOSIS — I10 Essential (primary) hypertension: Secondary | ICD-10-CM | POA: Diagnosis not present

## 2020-07-16 NOTE — Patient Instructions (Signed)
DASH Eating Plan DASH stands for "Dietary Approaches to Stop Hypertension." The DASH eating plan is a healthy eating plan that has been shown to reduce high blood pressure (hypertension). It may also reduce your risk for type 2 diabetes, heart disease, and stroke. The DASH eating plan may also help with weight loss. What are tips for following this plan?  General guidelines  Avoid eating more than 2,300 mg (milligrams) of salt (sodium) a day. If you have hypertension, you may need to reduce your sodium intake to 1,500 mg a day.  Limit alcohol intake to no more than 1 drink a day for nonpregnant women and 2 drinks a day for men. One drink equals 12 oz of beer, 5 oz of wine, or 1 oz of hard liquor.  Work with your health care provider to maintain a healthy body weight or to lose weight. Ask what an ideal weight is for you.  Get at least 30 minutes of exercise that causes your heart to beat faster (aerobic exercise) most days of the week. Activities may include walking, swimming, or biking.  Work with your health care provider or diet and nutrition specialist (dietitian) to adjust your eating plan to your individual calorie needs. Reading food labels   Check food labels for the amount of sodium per serving. Choose foods with less than 5 percent of the Daily Value of sodium. Generally, foods with less than 300 mg of sodium per serving fit into this eating plan.  To find whole grains, look for the word "whole" as the first word in the ingredient list. Shopping  Buy products labeled as "low-sodium" or "no salt added."  Buy fresh foods. Avoid canned foods and premade or frozen meals. Cooking  Avoid adding salt when cooking. Use salt-free seasonings or herbs instead of table salt or sea salt. Check with your health care provider or pharmacist before using salt substitutes.  Do not fry foods. Cook foods using healthy methods such as baking, boiling, grilling, and broiling instead.  Cook with  heart-healthy oils, such as olive, canola, soybean, or sunflower oil. Meal planning  Eat a balanced diet that includes: ? 5 or more servings of fruits and vegetables each day. At each meal, try to fill half of your plate with fruits and vegetables. ? Up to 6-8 servings of whole grains each day. ? Less than 6 oz of lean meat, poultry, or fish each day. A 3-oz serving of meat is about the same size as a deck of cards. One egg equals 1 oz. ? 2 servings of low-fat dairy each day. ? A serving of nuts, seeds, or beans 5 times each week. ? Heart-healthy fats. Healthy fats called Omega-3 fatty acids are found in foods such as flaxseeds and coldwater fish, like sardines, salmon, and mackerel.  Limit how much you eat of the following: ? Canned or prepackaged foods. ? Food that is high in trans fat, such as fried foods. ? Food that is high in saturated fat, such as fatty meat. ? Sweets, desserts, sugary drinks, and other foods with added sugar. ? Full-fat dairy products.  Do not salt foods before eating.  Try to eat at least 2 vegetarian meals each week.  Eat more home-cooked food and less restaurant, buffet, and fast food.  When eating at a restaurant, ask that your food be prepared with less salt or no salt, if possible. What foods are recommended? The items listed may not be a complete list. Talk with your dietitian about   what dietary choices are best for you. Grains Whole-grain or whole-wheat bread. Whole-grain or whole-wheat pasta. Brown rice. Oatmeal. Quinoa. Bulgur. Whole-grain and low-sodium cereals. Pita bread. Low-fat, low-sodium crackers. Whole-wheat flour tortillas. Vegetables Fresh or frozen vegetables (raw, steamed, roasted, or grilled). Low-sodium or reduced-sodium tomato and vegetable juice. Low-sodium or reduced-sodium tomato sauce and tomato paste. Low-sodium or reduced-sodium canned vegetables. Fruits All fresh, dried, or frozen fruit. Canned fruit in natural juice (without  added sugar). Meat and other protein foods Skinless chicken or turkey. Ground chicken or turkey. Pork with fat trimmed off. Fish and seafood. Egg whites. Dried beans, peas, or lentils. Unsalted nuts, nut butters, and seeds. Unsalted canned beans. Lean cuts of beef with fat trimmed off. Low-sodium, lean deli meat. Dairy Low-fat (1%) or fat-free (skim) milk. Fat-free, low-fat, or reduced-fat cheeses. Nonfat, low-sodium ricotta or cottage cheese. Low-fat or nonfat yogurt. Low-fat, low-sodium cheese. Fats and oils Soft margarine without trans fats. Vegetable oil. Low-fat, reduced-fat, or light mayonnaise and salad dressings (reduced-sodium). Canola, safflower, olive, soybean, and sunflower oils. Avocado. Seasoning and other foods Herbs. Spices. Seasoning mixes without salt. Unsalted popcorn and pretzels. Fat-free sweets. What foods are not recommended? The items listed may not be a complete list. Talk with your dietitian about what dietary choices are best for you. Grains Baked goods made with fat, such as croissants, muffins, or some breads. Dry pasta or rice meal packs. Vegetables Creamed or fried vegetables. Vegetables in a cheese sauce. Regular canned vegetables (not low-sodium or reduced-sodium). Regular canned tomato sauce and paste (not low-sodium or reduced-sodium). Regular tomato and vegetable juice (not low-sodium or reduced-sodium). Pickles. Olives. Fruits Canned fruit in a light or heavy syrup. Fried fruit. Fruit in cream or butter sauce. Meat and other protein foods Fatty cuts of meat. Ribs. Fried meat. Bacon. Sausage. Bologna and other processed lunch meats. Salami. Fatback. Hotdogs. Bratwurst. Salted nuts and seeds. Canned beans with added salt. Canned or smoked fish. Whole eggs or egg yolks. Chicken or turkey with skin. Dairy Whole or 2% milk, cream, and half-and-half. Whole or full-fat cream cheese. Whole-fat or sweetened yogurt. Full-fat cheese. Nondairy creamers. Whipped toppings.  Processed cheese and cheese spreads. Fats and oils Butter. Stick margarine. Lard. Shortening. Ghee. Bacon fat. Tropical oils, such as coconut, palm kernel, or palm oil. Seasoning and other foods Salted popcorn and pretzels. Onion salt, garlic salt, seasoned salt, table salt, and sea salt. Worcestershire sauce. Tartar sauce. Barbecue sauce. Teriyaki sauce. Soy sauce, including reduced-sodium. Steak sauce. Canned and packaged gravies. Fish sauce. Oyster sauce. Cocktail sauce. Horseradish that you find on the shelf. Ketchup. Mustard. Meat flavorings and tenderizers. Bouillon cubes. Hot sauce and Tabasco sauce. Premade or packaged marinades. Premade or packaged taco seasonings. Relishes. Regular salad dressings. Where to find more information:  National Heart, Lung, and Blood Institute: www.nhlbi.nih.gov  American Heart Association: www.heart.org Summary  The DASH eating plan is a healthy eating plan that has been shown to reduce high blood pressure (hypertension). It may also reduce your risk for type 2 diabetes, heart disease, and stroke.  With the DASH eating plan, you should limit salt (sodium) intake to 2,300 mg a day. If you have hypertension, you may need to reduce your sodium intake to 1,500 mg a day.  When on the DASH eating plan, aim to eat more fresh fruits and vegetables, whole grains, lean proteins, low-fat dairy, and heart-healthy fats.  Work with your health care provider or diet and nutrition specialist (dietitian) to adjust your eating plan to your   individual calorie needs. This information is not intended to replace advice given to you by your health care provider. Make sure you discuss any questions you have with your health care provider. Document Revised: 09/15/2017 Document Reviewed: 09/26/2016 Elsevier Patient Education  2020 [employer]  

## 2020-07-16 NOTE — Progress Notes (Signed)
Patient ID: Heather Walker, female    DOB: May 31, 1961  Age: 59 y.o. MRN: [id number]    Subjective:  Subjective  HPI TONJI ELLIFF presents for f/u bp.  Her bp was running high at home -- she has a wrist cuff  Review of Systems  Constitutional: Negative for appetite change, diaphoresis, fatigue and unexpected weight change.  Eyes: Negative for pain, redness and visual disturbance.  Respiratory: Negative for cough, chest tightness, shortness of breath and wheezing.   Cardiovascular: Negative for chest pain, palpitations and leg swelling.  Endocrine: Negative for cold intolerance, heat intolerance, polydipsia, polyphagia and polyuria.  Genitourinary: Negative for difficulty urinating, dysuria and frequency.  Neurological: Negative for dizziness, light-headedness, numbness and headaches.    History Past Medical History:  Diagnosis Date  . Arthritis   . Asthma   . Depression   . Diabetes mellitus without complication ([place])   . Heart murmur   . Hypertension     She has a past surgical history that includes Abdominal hysterectomy; Bladder suspension; Appendectomy; Cesarean section; Tubal ligation; and TEE without cardioversion (N/A, 08/24/2017).   Her family history includes Aneurysm in her sister; Asthma in her mother, sister, sister, and sister; Diabetes in her sister; Heart defect in her sister; Hypertension in her maternal grandmother, mother, and sister.She reports that she has never smoked. She has never used smokeless tobacco. She reports that she does not drink alcohol and does not use drugs.  Current Outpatient Medications on File Prior to Visit  Medication Sig Dispense Refill  . amLODipine (NORVASC) 10 MG tablet Take 1 tablet (10 mg total) by mouth daily. 90 tablet 1  . cloNIDine (CATAPRES) 0.1 MG tablet Take 1 tablet (0.1 mg total) by mouth 2 (two) times daily. 60 tablet 11  . glucose blood (ONETOUCH VERIO) test strip 1 each by Other route 2 (two) times daily. Use as  instructed 100 each 6  . hydrocortisone (CORTEF) 5 MG tablet Take 2 tablets (10 mg total) by mouth daily with breakfast AND 1 tablet (5 mg total) daily. 90 tablet 1  . insulin isophane & regular human (NOVOLIN 70/30 FLEXPEN) (70-30) 100 UNIT/ML KwikPen Inject 22 Units into the skin 2 (two) times daily with a meal. 15 mL 6  . Insulin Pen Needle 32G X 4 MM MISC Inject 1 Device into the skin in the morning and at bedtime. 2x daily 100 each 6  . Lancets (ONETOUCH ULTRASOFT) lancets Use as instructed to test blood sugar 2 times daily E11.65 100 each 12  . ondansetron (ZOFRAN ODT) 4 MG disintegrating tablet Take 1 tablet (4 mg total) by mouth every 8 (eight) hours as needed for nausea or vomiting. 20 tablet 0  . OneTouch Delica Lancets 17G MISC 1 Package by Does not apply route in the morning and at bedtime. Use as instructed 2 times daily E11.65 100 each 6  . Semaglutide,0.25 or 0.5MG /DOS, (OZEMPIC, 0.25 OR 0.5 MG/DOSE,) 2 MG/1.5ML SOPN Inject 0.5 mg into the skin once a week. (Patient taking differently: Inject 0.5 mg into the skin every Sunday. ) 1 pen 6  . triamcinolone cream (KENALOG) 0.1 % Apply 1 application topically 2 (two) times daily. 30 g 0  . Vitamin D, Ergocalciferol, (DRISDOL) 1.25 MG (50000 UNIT) CAPS capsule Take 1 capsule (50,000 Units total) by mouth every 7 (seven) days. 4 capsule 3   No current facility-administered medications on file prior to visit.     Objective:  Objective  Physical Exam Vitals and nursing note  reviewed.  Constitutional:      Appearance: She is well-developed.  HENT:     Head: Normocephalic and atraumatic.  Eyes:     Conjunctiva/sclera: Conjunctivae normal.  Neck:     Thyroid: No thyromegaly.     Vascular: No carotid bruit or JVD.  Cardiovascular:     Rate and Rhythm: Normal rate and regular rhythm.     Heart sounds: Normal heart sounds. No murmur heard.   Pulmonary:     Effort: Pulmonary effort is normal. No respiratory distress.     Breath  sounds: Normal breath sounds. No wheezing or rales.  Chest:     Chest wall: No tenderness.  Musculoskeletal:     Cervical back: Normal range of motion and neck supple.  Neurological:     Mental Status: She is alert and oriented to person, place, and time.    BP 116/88 (BP Location: Right Arm, Patient Position: Sitting, Cuff Size: Large)   Pulse 90   Temp 98.7 F (37.1 C) (Oral)   Resp 18   Ht 5\' 3"  (1.6 m)   Wt 198 lb 12.8 oz (90.2 kg)   SpO2 98%   BMI 35.22 kg/m  Wt Readings from Last 3 Encounters:  07/16/20 198 lb 12.8 oz (90.2 kg)  07/02/20 197 lb 6.4 oz (89.5 kg)  06/11/20 193 lb 3.2 oz (87.6 kg)     Lab Results  Component Value Date   WBC 3.4 (L) 06/11/2020   HGB 12.7 06/11/2020   HCT 39.4 06/11/2020   PLT 267 06/11/2020   GLUCOSE 203 (H) 04/16/2020   CHOL 173 02/20/2019   TRIG 172.0 (H) 02/20/2019   HDL 51.90 02/20/2019   LDLDIRECT 115.0 08/02/2018   LDLCALC 87 02/20/2019   ALT 32 04/14/2020   AST 23 04/14/2020   NA 141 04/16/2020   K 3.5 04/16/2020   CL 105 04/16/2020   CREATININE 1.04 (H) 04/16/2020   BUN 13 04/16/2020   CO2 27 04/16/2020   TSH 1.63 06/11/2020   INR 1.0 04/15/2020   HGBA1C 8.7 (A) 03/03/2020   MICROALBUR <0.7 12/04/2019    ECHOCARDIOGRAM COMPLETE  Result Date: 04/16/2020    ECHOCARDIOGRAM REPORT   Patient Name:   ROX MCGRIFF Date of Exam: 04/16/2020 Medical Rec #:  [id number]      Height:       63.0 in Accession #:    [id number]     Weight:       198.0 lb Date of Birth:  [birth year]      BSA:          1.925 m Patient Age:    69 years       BP:           142/96 mmHg Patient Gender: F              HR:           88 bpm. Exam Location:  [facility] Procedure: 2D Echo, Color Doppler and Cardiac Doppler Indications:     R07.9 Chest Pain  History:         Patient has prior history of Echocardiogram examinations. Risk                  Factors:Hypertension and Diabetes.  Sonographer:     Charmayne Sheer RDCS (AE) Referring Phys:  [id number] [place]  Diagnosing Phys: Neoma Laming MD  Sonographer Comments: No subcostal window and suboptimal parasternal window. IMPRESSIONS  1. Left ventricular ejection fraction,  by estimation, is 60 to 65%. The left ventricle has normal function. The left ventricle has no regional wall motion abnormalities. Left ventricular diastolic parameters are consistent with Grade I diastolic dysfunction (impaired relaxation).  2. Right ventricular systolic function is normal. The right ventricular size is normal.  3. Left atrial size was mildly dilated.  4. Right atrial size was mildly dilated.  5. The mitral valve is normal in structure. Trivial mitral valve regurgitation. No evidence of mitral stenosis.  6. The aortic valve is normal in structure. Aortic valve regurgitation is not visualized. No aortic stenosis is present.  7. The inferior vena cava is normal in size with greater than 50% respiratory variability, suggesting right atrial pressure of 3 mmHg. FINDINGS  Left Ventricle: Left ventricular ejection fraction, by estimation, is 60 to 65%. The left ventricle has normal function. The left ventricle has no regional wall motion abnormalities. The left ventricular internal cavity size was normal in size. There is  no left ventricular hypertrophy. Left ventricular diastolic parameters are consistent with Grade I diastolic dysfunction (impaired relaxation). Right Ventricle: The right ventricular size is normal. No increase in right ventricular wall thickness. Right ventricular systolic function is normal. Left Atrium: Left atrial size was mildly dilated. Right Atrium: Right atrial size was mildly dilated. Pericardium: There is no evidence of pericardial effusion. Mitral Valve: The mitral valve is normal in structure. Normal mobility of the mitral valve leaflets. Trivial mitral valve regurgitation. No evidence of mitral valve stenosis. MV peak gradient, 3.6 mmHg. The mean mitral valve gradient is 2.0 mmHg. Tricuspid Valve: The tricuspid  valve is normal in structure. Tricuspid valve regurgitation is trivial. No evidence of tricuspid stenosis. Aortic Valve: The aortic valve is normal in structure. Aortic valve regurgitation is not visualized. No aortic stenosis is present. Aortic valve mean gradient measures 4.0 mmHg. Aortic valve peak gradient measures 7.3 mmHg. Aortic valve area, by VTI measures 2.23 cm. Pulmonic Valve: The pulmonic valve was normal in structure. Pulmonic valve regurgitation is not visualized. No evidence of pulmonic stenosis. Aorta: The aortic root is normal in size and structure. Venous: The inferior vena cava is normal in size with greater than 50% respiratory variability, suggesting right atrial pressure of 3 mmHg. IAS/Shunts: No atrial level shunt detected by color flow Doppler.  LEFT VENTRICLE PLAX 2D LVIDd:         3.68 cm  Diastology LVIDs:         2.61 cm  LV e' lateral:   7.51 cm/s LV PW:         0.99 cm  LV E/e' lateral: 7.3 LV IVS:        0.86 cm  LV e' medial:    4.79 cm/s LVOT diam:     1.90 cm  LV E/e' medial:  11.4 LV SV:         41 LV SV Index:   21 LVOT Area:     2.84 cm  RIGHT VENTRICLE RV Basal diam:  2.47 cm LEFT ATRIUM             Index      RIGHT ATRIUM          Index LA diam:        2.20 cm 1.14 cm/m RA Area:     7.16 cm LA Vol (A2C):   14.7 ml 7.63 ml/m RA Volume:   11.40 ml 5.92 ml/m LA Vol (A4C):   16.8 ml 8.73 ml/m LA Biplane Vol: 15.8 ml 8.21 ml/m  AORTIC VALVE                   PULMONIC VALVE AV Area (Vmax):    2.03 cm    PV Vmax:       1.08 m/s AV Area (Vmean):   2.01 cm    PV Vmean:      69.600 cm/s AV Area (VTI):     2.23 cm    PV VTI:        0.167 m AV Vmax:           135.00 cm/s PV Peak grad:  4.7 mmHg AV Vmean:          91.900 cm/s PV Mean grad:  2.0 mmHg AV VTI:            0.182 m AV Peak Grad:      7.3 mmHg AV Mean Grad:      4.0 mmHg LVOT Vmax:         96.60 cm/s LVOT Vmean:        65.000 cm/s LVOT VTI:          0.143 m LVOT/AV VTI ratio: 0.79  AORTA Ao Root diam: 3.10 cm MITRAL  VALVE MV Area (PHT): 4.29 cm    SHUNTS MV Peak grad:  3.6 mmHg    Systemic VTI:  0.14 m MV Mean grad:  2.0 mmHg    Systemic Diam: 1.90 cm MV Vmax:       0.95 m/s MV Vmean:      65.2 cm/s MV Decel Time: 177 msec MV E velocity: 54.80 cm/s MV A velocity: 78.80 cm/s MV E/A ratio:  0.70 Neoma Laming MD Electronically signed by Neoma Laming MD Signature Date/Time: 04/16/2020/1:55:43 PM    Final      Assessment & Plan:  Plan  I have discontinued Connye Burkitt. Meller's traZODone. I am also having her maintain her OneTouch Verio, Insulin Pen Needle, onetouch ultrasoft, OneTouch Delica Lancets 06Y, Ozempic (0.25 or 0.5 MG/DOSE), NovoLIN 70/30 FlexPen, triamcinolone cream, ondansetron, hydrocortisone, cloNIDine, Vitamin D (Ergocalciferol), and amLODipine.  No orders of the defined types were placed in this encounter.   Problem List Items Addressed This Visit      Unprioritized   Essential hypertension - Primary (Chronic)    Well controlled, no changes to meds. Encouraged heart healthy diet such as the DASH diet and exercise as tolerated.  con't norvasc , clonidine       Hyperlipidemia   Relevant Orders   Lipid panel   Comprehensive metabolic panel   Insomnia      Follow-up: Return in about 3 months (around 10/15/2020), or if symptoms worsen or fail to improve, for annual exam, fasting.  [name], DO

## 2020-07-16 NOTE — Assessment & Plan Note (Signed)
Well controlled, no changes to meds. Encouraged heart healthy diet such as the DASH diet and exercise as tolerated.  con't norvasc , clonidine

## 2020-07-17 ENCOUNTER — Other Ambulatory Visit: Payer: Self-pay

## 2020-07-17 DIAGNOSIS — E785 Hyperlipidemia, unspecified: Secondary | ICD-10-CM

## 2020-07-17 LAB — COMPREHENSIVE METABOLIC PANEL
AG Ratio: 1.4 (calc) (ref 1.0–2.5)
ALT: 8 U/L (ref 6–29)
AST: 12 U/L (ref 10–35)
Albumin: 4 g/dL (ref 3.6–5.1)
Alkaline phosphatase (APISO): 110 U/L (ref 37–153)
BUN/Creatinine Ratio: 8 (calc) (ref 6–22)
BUN: 6 mg/dL — ABNORMAL LOW (ref 7–25)
CO2: 32 mmol/L (ref 20–32)
Calcium: 8.8 mg/dL (ref 8.6–10.4)
Chloride: 101 mmol/L (ref 98–110)
Creat: 0.75 mg/dL (ref 0.50–1.05)
Globulin: 2.8 g/dL (calc) (ref 1.9–3.7)
Glucose, Bld: 131 mg/dL — ABNORMAL HIGH (ref 65–99)
Potassium: 3.1 mmol/L — ABNORMAL LOW (ref 3.5–5.3)
Sodium: 142 mmol/L (ref 135–146)
Total Bilirubin: 0.4 mg/dL (ref 0.2–1.2)
Total Protein: 6.8 g/dL (ref 6.1–8.1)

## 2020-07-17 LAB — LIPID PANEL
Cholesterol: 174 mg/dL (ref <200)
HDL: 58 mg/dL (ref >=50)
LDL Cholesterol (Calc): 89 mg/dL (calc)
Non-HDL Cholesterol (Calc): 116 mg/dL (calc) (ref <130)
Total CHOL/HDL Ratio: 3 (calc) (ref <5.0)
Triglycerides: 168 mg/dL — ABNORMAL HIGH (ref <150)

## 2020-07-20 ENCOUNTER — Ambulatory Visit (INDEPENDENT_AMBULATORY_CARE_PROVIDER_SITE_OTHER): Payer: Medicare HMO | Admitting: Internal Medicine

## 2020-07-20 ENCOUNTER — Encounter: Payer: Self-pay | Admitting: Internal Medicine

## 2020-07-20 ENCOUNTER — Other Ambulatory Visit: Payer: Self-pay

## 2020-07-20 VITALS — BP 144/90 | HR 80 | Ht 63.0 in | Wt 202.6 lb

## 2020-07-20 DIAGNOSIS — E2740 Unspecified adrenocortical insufficiency: Secondary | ICD-10-CM | POA: Diagnosis not present

## 2020-07-20 DIAGNOSIS — R82998 Other abnormal findings in urine: Secondary | ICD-10-CM | POA: Diagnosis not present

## 2020-07-20 DIAGNOSIS — E1165 Type 2 diabetes mellitus with hyperglycemia: Secondary | ICD-10-CM

## 2020-07-20 DIAGNOSIS — R7989 Other specified abnormal findings of blood chemistry: Secondary | ICD-10-CM

## 2020-07-20 LAB — POCT GLYCOSYLATED HEMOGLOBIN (HGB A1C): Hemoglobin A1C: 6.8 % — AB (ref 4.0–5.6)

## 2020-07-20 LAB — CORTISOL: Cortisol, Plasma: 8.7 ug/dL

## 2020-07-20 NOTE — Patient Instructions (Addendum)
-   Continue Ozempic 0.5 mg ONCE weekly -  Continue Novolin Mix to 25 units with Breakfast and 25 units with Supper  - Please check if you are taking Hydrocortisone (Cortef) and let me know       HOW TO TREAT LOW BLOOD SUGARS (Blood sugar LESS THAN 70 MG/DL)  Please follow the RULE OF 15 for the treatment of hypoglycemia treatment (when your (blood sugars are less than 70 mg/dL)    STEP 1: Take 15 grams of carbohydrates when your blood sugar is low, which includes:   3-4 GLUCOSE TABS  OR  3-4 OZ OF JUICE OR REGULAR SODA OR  ONE TUBE OF GLUCOSE GEL     STEP 2: RECHECK blood sugar in 15 MINUTES STEP 3: If your blood sugar is still low at the 15 minute recheck --> then, go back to STEP 1 and treat AGAIN with another 15 grams of carbohydrates.

## 2020-07-20 NOTE — Progress Notes (Signed)
Name: Heather Walker  Age/ Sex: 59 y.o., female   MRN/ DOB: [id number], [birth year]     PCP: [name], DO   Reason for Endocrinology Evaluation: Type 2 Diabetes Mellitus  Initial Endocrine Consultative Visit: 09/26/2018    PATIENT IDENTIFIER: Heather Walker is a 59 y.o. female with a past medical history of HTN, T2DM, GERD and Dyslipidemia . The patient has followed with Endocrinology clinic since 09/26/2018 for consultative assistance with management of her diabetes.  DIABETIC HISTORY:  [name] was diagnosed with T2DM in 2015. She has reported GI intolerance to Metformin. She was unable to try Jardiance due to insurance issues. Wilder Glade gave her neck pain. Her hemoglobin A1c has ranged from 8.5%  in 2016, peaking at 9.8% in 2019.   On her initial visit to our clinic her A1c 9.8%. She was not taking any of her prescribed medications. We stopped Glipizide and started Novolog mix.  She has declined a CDE referral  Ozempic started 02/2020    IN 03/2020 she presented to the ED with shoulder pain and chills, she had just received a steroid injection the prior day . CT abdomen was negative, MRI of shoulder showed adhesive capsulitis Due to dizziness and hypokalemia ( 2.7 mmol/L) , cortisol was checked and it was low at 1.9 ug/dL by 914AM , staff attempted to proceed with Cosyntropin stim. Test but the pt did not want to wait.  Of note her BP upon presentation was 163/116  mmhg  Repeat cortisol at our office was trending up but her ACTH was low and we started her on HC.   Pt eventually had a cosyntropin stim test with a 60 minute 9.7 ug/dL  She was started on [facility] in 04/2020  SUBJECTIVE:   During the last visit (03/2020): A1c 8.7 %. Started Ozempic and decreased insulin mix     Today (07/20/2020): Heather Walker is here for a follow up on diabetes and secondary adrenal insufficiency . She checks her  Sugar occasionally.   She presented to the ED on 04/06/2020 with shoulder  pain and chills. CT abdomen was negative, MRI of shoulder showed adhesive capsulitis. She is S/P a shoulder intra-articular injection on 6/24th.  Due to dizziness and hypokalemia ( 2.7 mmol/L) , cortisol was checked and it was low at 1.9 ug/dL by 914AM , cosyntropin was abnormal.   Pt started on Hydrocortisone 04/2020 but she is not sure she is taking it      HOME ENDOCRINE REGIMEN:   Novolin Mix (70/30) 25 units BID   Ozempic 0.5 mg weekly   HC 5 mg, 2 tabs QAM and 1 tab Q PM - not sure she is taking    METER DOWNLOAD SUMMARY:  Overall Mean FS Glucose =159 Standard Deviation = 47  BG Ranges: Low = 103 High = 213    Hypoglycemic Events/30 Days: BG < 50 = 0 Episodes of symptomatic severe hypoglycemia = 0      DIABETIC COMPLICATIONS: Microvascular complications:   Denies: retinopathy , neuropathy  Last eye exam: Completed 01/2019  Macrovascular complications:   Denies: CAD, PVD, CVA      HISTORY:  Past Medical History:  Past Medical History:  Diagnosis Date  . Arthritis   . Asthma   . Depression   . Diabetes mellitus without complication ([place])   . Heart murmur   . Hypertension    Past Surgical History:  Past Surgical History:  Procedure Laterality Date  .  ABDOMINAL HYSTERECTOMY    . APPENDECTOMY    . BLADDER SUSPENSION    . CESAREAN SECTION     3 previous  . TEE WITHOUT CARDIOVERSION N/A 08/24/2017   Procedure: TRANSESOPHAGEAL ECHOCARDIOGRAM (TEE);  Surgeon: Larey Dresser, MD;  Location: [facility] ENDOSCOPY;  Service: Cardiovascular;  Laterality: N/A;  . TUBAL LIGATION      Social History:  reports that she has never smoked. She has never used smokeless tobacco. She reports that she does not drink alcohol and does not use drugs. Family History:  Family History  Problem Relation Age of Onset  . Hypertension Mother   . Asthma Mother   . Hypertension Sister   . Asthma Sister   . Diabetes Sister   . Hypertension Maternal Grandmother   . Heart  defect Sister   . Asthma Sister   . Aneurysm Sister   . Asthma Sister      HOME MEDICATIONS: Allergies as of 07/20/2020      Reactions   Crestor [rosuvastatin Calcium] Anaphylaxis   Metformin And Related Diarrhea   Nausea and diarrhea   Atorvastatin Other (See Comments)   Neck stiffness   Codeine Itching      Medication List       Accurate as of July 20, 2020  9:59 AM. If you have any questions, ask your nurse or doctor.        amLODipine 10 MG tablet Commonly known as: NORVASC Take 1 tablet (10 mg total) by mouth daily.   cloNIDine 0.1 MG tablet Commonly known as: Catapres Take 1 tablet (0.1 mg total) by mouth 2 (two) times daily.   hydrocortisone 5 MG tablet Commonly known as: CORTEF Take 2 tablets (10 mg total) by mouth daily with breakfast AND 1 tablet (5 mg total) daily.   Insulin Pen Needle 32G X 4 MM Misc Inject 1 Device into the skin in the morning and at bedtime. 2x daily   NovoLIN 70/30 FlexPen (70-30) 100 UNIT/ML KwikPen Generic drug: insulin isophane & regular human Inject 22 Units into the skin 2 (two) times daily with a meal.   ondansetron 4 MG disintegrating tablet Commonly known as: Zofran ODT Take 1 tablet (4 mg total) by mouth every 8 (eight) hours as needed for nausea or vomiting.   onetouch ultrasoft lancets Use as instructed to test blood sugar 2 times daily O97.35   OneTouch Delica Lancets 32D Misc 1 Package by Does not apply route in the morning and at bedtime. Use as instructed 2 times daily E11.65   OneTouch Verio test strip Generic drug: glucose blood 1 each by Other route 2 (two) times daily. Use as instructed   Ozempic (0.25 or 0.5 MG/DOSE) 2 MG/1.5ML Sopn Generic drug: Semaglutide(0.25 or 0.5MG /DOS) Inject 0.5 mg into the skin once a week. What changed: when to take this   triamcinolone cream 0.1 % Commonly known as: KENALOG Apply 1 application topically 2 (two) times daily.   Vitamin D (Ergocalciferol) 1.25 MG (50000  UNIT) Caps capsule Commonly known as: DRISDOL Take 1 capsule (50,000 Units total) by mouth every 7 (seven) days.        OBJECTIVE:   Vital Signs: BP (!) 144/90 (BP Location: Left Arm, Patient Position: Sitting, Cuff Size: Normal)   Pulse 80   Ht 5\' 3"  (1.6 m)   Wt 202 lb 9.6 oz (91.9 kg)   SpO2 98%   BMI 35.89 kg/m   Wt Readings from Last 3 Encounters:  07/20/20 202 lb 9.6 oz (  91.9 kg)  07/16/20 198 lb 12.8 oz (90.2 kg)  07/02/20 197 lb 6.4 oz (89.5 kg)     Exam: General: Pt appears lethargic in a wheelchair   Lungs: Clear with good BS bilat with no rales, rhonchi, or wheezes  Heart: RRR   Extremities: No pretibial edema.   Neuro: MS is good with appropriate affect, pt is alert and Ox3    DM foot exam: 07/20/2020 The skin of the feet is intact without sores or ulcerations. The pedal pulses are 2+ on right and 2+ on left. The sensation is intact to a screening 5.07, 10 gram monofilament bilaterally         DATA REVIEWED:  Lab Results  Component Value Date   HGBA1C 8.7 (A) 03/03/2020   HGBA1C 12.0 (A) 12/04/2019   HGBA1C 11.5 (H) 02/20/2019   Lab Results  Component Value Date   MICROALBUR <0.7 12/04/2019   LDLCALC 89 07/16/2020   CREATININE 0.75 07/16/2020   Lab Results  Component Value Date   MICRALBCREAT 1.6 12/04/2019     Lab Results  Component Value Date   CHOL 174 07/16/2020   HDL 58 07/16/2020   LDLCALC 89 07/16/2020   LDLDIRECT 115.0 08/02/2018   TRIG 168 (H) 07/16/2020   CHOLHDL 3.0 07/16/2020        ASSESSMENT / PLAN / RECOMMENDATIONS:   1) Type 2 Diabetes Mellitus, Improving Glycemic Control, Without complications - Most recent A1c of  6.8%. Goal A1c < 7.0 %.    -She has been tolerating Ozempic  - A1c at goal  - I have praised the pt on optimal glycemic control  - No changes     MEDICATIONS:   Continue Ozempic 0.5 mg weekly for now  Continue Novolin mix (70/30) 25 units BID before breakfast and supper   EDUCATION /  INSTRUCTIONS:  BG monitoring instructions: Patient is instructed to check her blood sugars 2 times a day, fasting and supper time.  Call [facility] if: BG persistently < 70  . I reviewed the Rule of 15 for the treatment of hypoglycemia in detail with the patient. Literature supplied.   2) Diabetic complications:   Eye: Does not have known diabetic retinopathy.   Neuro/ Feet: Does not have known diabetic peripheral neuropathy .   Renal: Patient does not have known baseline CKD, but has a low GFR for the first time , will continue to monitor. She   is  on an ACEI/ARB at present.   3) Low serum cortisol:  - She had an abnormal Cosyntropin stim test a few days after a shoulder intra-articular injection. She was started on HC back in 04/2020 due to low ACTH and non-specific symptom. I have called the pharmacy on 07/21/2020 and confirmed her last prescription was picked up on 04/16/2020 for a 30 days supply.  Pt has not used HC since 05/2020, will check ACTH , cortisol is normal today    F/U in 3 months    Signed electronically by: Mack Guise, MD  [facility]  [address] [place], [place] [address] Phone: [phone] FAX: [phone]   CC: [name], DO [address] [place] [address] Phone: [phone]  Fax: [phone]  Return to Endocrinology clinic as below: Future Appointments  Date Time Provider [place]  07/31/2020 10:15 AM [facility]  10/02/2020  1:00 PM Lowne Koren Shiver, DO [facility]  10/19/2020  1:40 PM Carollee Herter, Yvonne R, DO [facility]

## 2020-07-23 LAB — ACTH: C206 ACTH: 9 pg/mL (ref 6–50)

## 2020-07-27 ENCOUNTER — Encounter: Payer: Self-pay | Admitting: Internal Medicine

## 2020-07-31 ENCOUNTER — Other Ambulatory Visit: Payer: Medicare HMO

## 2020-08-18 ENCOUNTER — Telehealth: Payer: Self-pay | Admitting: Pharmacist

## 2020-08-18 NOTE — Progress Notes (Addendum)
Chronic Care Management -Pharmacy Assistant  - Name: HYDEE FLEECE  MRN: [id number] DOB: 1961/09/14  Reason for Encounter: Disease State  Patient Questions:  1.  Have you seen any other providers since your last visit? No  2.  Any changes in your medicines or health? No  PCP : [name], DO   Their chronic conditions include:Diabetes, Hypertension, Asthma, Depression  Office visit: 07-16-20(PCP) Patient presented in office with Roma Schanz for follow up.  Labs ordered.  Potassium is low; provider advised patient eat more K rich foods--- bananas, oj, green leafy veg. Recheck cmp 2 weeks  Provider discontinued Trazodone. 07-02-20(PCP) Patient presented in office with Roma Schanz for a follow up on blood pressure and flank pain.  No medication changes.  Consults: 07-20-20(Endocrinology) Patient presented in office with Cloyd Stagers for a follow up visit.  Provider discontinued hydrocortisone 5 mg. 07-08-20(Neurology)  Patient presented in office with Marcial Pacas for bilateral toes paresthesia since July 2021.  No medication changes.  Allergies:   Allergies  Allergen Reactions  . Crestor [Rosuvastatin Calcium] Anaphylaxis  . Metformin And Related Diarrhea    Nausea and diarrhea  . Atorvastatin Other (See Comments)    Neck stiffness  . Codeine Itching    Medications: Outpatient Encounter Medications as of 08/18/2020  Medication Sig  . amLODipine (NORVASC) 10 MG tablet Take 1 tablet (10 mg total) by mouth daily.  . cloNIDine (CATAPRES) 0.1 MG tablet Take 1 tablet (0.1 mg total) by mouth 2 (two) times daily.  Marland Kitchen glucose blood (ONETOUCH VERIO) test strip 1 each by Other route 2 (two) times daily. Use as instructed  . insulin isophane & regular human (NOVOLIN 70/30 FLEXPEN) (70-30) 100 UNIT/ML KwikPen Inject 22 Units into the skin 2 (two) times daily with a meal.  . Insulin Pen Needle 32G X 4 MM MISC Inject 1 Device into the skin in the  morning and at bedtime. 2x daily  . Lancets (ONETOUCH ULTRASOFT) lancets Use as instructed to test blood sugar 2 times daily E11.65  . ondansetron (ZOFRAN ODT) 4 MG disintegrating tablet Take 1 tablet (4 mg total) by mouth every 8 (eight) hours as needed for nausea or vomiting.  [name] Delica Lancets 62X MISC 1 Package by Does not apply route in the morning and at bedtime. Use as instructed 2 times daily E11.65  . Semaglutide,0.25 or 0.5MG /DOS, (OZEMPIC, 0.25 OR 0.5 MG/DOSE,) 2 MG/1.5ML SOPN Inject 0.5 mg into the skin once a week. (Patient taking differently: Inject 0.5 mg into the skin every Sunday. )  . triamcinolone cream (KENALOG) 0.1 % Apply 1 application topically 2 (two) times daily.  . Vitamin D, Ergocalciferol, (DRISDOL) 1.25 MG (50000 UNIT) CAPS capsule Take 1 capsule (50,000 Units total) by mouth every 7 (seven) days.   No facility-administered encounter medications on file as of 08/18/2020.    Current Diagnosis: Patient Active Problem List   Diagnosis Date Noted  . Hyperlipidemia 07/16/2020  . Dizziness 04/15/2020  . BMI 35.0-35.9,adult 04/15/2020  . Weakness 04/15/2020  . Abnormal cortisol level 04/14/2020  . SIRS (systemic inflammatory response syndrome) ([place]) 04/06/2020  . Pneumonia due to COVID-19 virus 05/14/2019  . COVID-19 virus infection 05/13/2019  . Renal insufficiency 03/26/2019  . At risk for cardiovascular event 03/26/2019  . Allergic reaction due to correct medicinal substance properly administered 08/23/2018  . Hypertriglyceridemia 08/05/2018  . Asthma, chronic, unspecified asthma severity, uncomplicated 52/84/1324  . Chronic right shoulder pain 08/05/2018  .  Muscle spasm 08/05/2018  . Insomnia 10/05/2017  . Sepsis ([place]) 08/21/2017  . Acute right hip pain 08/21/2017  . Preventative health care 11/20/2016  . Right ankle pain 05/09/2016  . Palpitations 12/08/2014  . DM (diabetes mellitus) type II uncontrolled, periph vascular disorder ([place]) 08/03/2014    . Leg wound, right 08/03/2014  . Hyperglycemia 07/13/2014  . Chest pain 07/12/2014  . Diabetes mellitus ([place]) 07/12/2014  . Acute kidney injury ([place]) 07/12/2014  . Abdominal pain, right upper quadrant 05/28/2014  . Tachycardia 05/03/2013  . Abscess 11/13/2012  . Breast pain, right 08/10/2012  . Osteoarthritis of left shoulder 03/04/2011  . DEPRESSION, MAJOR, RECURRENT, SEVERE 12/22/2009  . BACK PAIN, LUMBAR, WITH RADICULOPATHY 09/17/2009  . CALF PAIN, RIGHT 09/17/2009  . RENAL CYST 08/28/2009  . LOW BACK PAIN, ACUTE 11/13/2008  . FIBROIDS, UTERUS 09/10/2007  . UTI 09/03/2007  . BACTERIAL VAGINITIS 09/03/2007  . Flank pain 09/03/2007  . Hypokalemia 09/03/2007  . MOLE 06/21/2007  . ALLERGIC RHINITIS 06/21/2007  . HEMOCCULT POSITIVE STOOL 06/21/2007  . SINUSITIS, ACUTE NOS 04/05/2007  . REFLUX, ESOPHAGEAL 04/05/2007  . CHEST PAIN 04/05/2007  . Essential hypertension 03/19/2007  . ALLERGIC RHINITIS, SEASONAL 03/09/2007  . Asthma 03/09/2007    Goals Addressed   None    Recent Relevant Labs: Lab Results  Component Value Date/Time   HGBA1C 6.8 (A) 07/20/2020 09:59 AM   HGBA1C 8.7 (A) 03/03/2020 08:46 AM   HGBA1C 11.5 (H) 02/20/2019 01:16 PM   HGBA1C 9.8 (H) 08/02/2018 03:25 PM   MICROALBUR <0.7 12/04/2019 03:07 PM   MICROALBUR 1.3 11/10/2016 01:07 PM    Kidney Function Lab Results  Component Value Date/Time   CREATININE 0.75 07/16/2020 01:20 PM   CREATININE 1.04 (H) 04/16/2020 12:14 PM   CREATININE 1.20 (H) 04/15/2020 06:10 PM   GFR 66.72 04/14/2020 04:01 PM   GFRNONAA 60 (L) 04/16/2020 12:14 PM   GFRAA >60 04/16/2020 12:14 PM    . Current antihyperglycemic regimen:  o Novolin 70/30 Flexpen o Semaglutide 0.5 mg . What recent interventions/DTPs have been made to improve glycemic control:  o None . Have there been any recent hospitalizations or ED visits since last visit with CPP? No . Patient denies hypoglycemic symptoms. . Patient denies hyperglycemic  symptoms. . How often are you checking your blood sugar? once daily . What are your blood sugars ranging?  o Fasting: 103, 108, 99 . During the week, how often does your blood glucose drop below 70? No . Are you checking your feet daily/regularly? No problems with feet at the time.  Does see the podiatrist on [address]  Adherence Review: Is the patient currently on a STATIN medication? No Is the patient currently on ACE/ARB medication? No Does the patient have >5 day gap between last estimated fill dates? Yes    Follow-Up:  Pharmacist Review and Scheduled Follow-Up With Clinical Pharmacist  Appointment scheduled for 10-12-20 at 3pm via telephone.  [place], [facility] [place] [phone]  Noted >5 day gap history per dispense report. Need to assure adherence. -EZMO Fill Dates Per Dispense Report  Triamterene/hctz: 05/15/20 90 DS Amlodipine: 07/02/20 90 DS, 05/15/20 30 DS  Reviewed by: De Blanch, PharmD Clinical Pharmacist [facility] [phone]

## 2020-08-31 ENCOUNTER — Other Ambulatory Visit: Payer: Self-pay | Admitting: Family Medicine

## 2020-08-31 NOTE — Telephone Encounter (Signed)
triamterene no longer on med list. D/c'd by Dr. Kelton Pillar on 04/15/2020. Please advise.

## 2020-08-31 NOTE — Telephone Encounter (Signed)
Please let pt know dr Kelton Pillar stopped it

## 2020-10-02 ENCOUNTER — Ambulatory Visit: Payer: Medicare HMO | Admitting: Family Medicine

## 2020-10-12 ENCOUNTER — Ambulatory Visit: Payer: Medicare HMO | Admitting: Pharmacist

## 2020-10-12 DIAGNOSIS — E785 Hyperlipidemia, unspecified: Secondary | ICD-10-CM

## 2020-10-12 DIAGNOSIS — I10 Essential (primary) hypertension: Secondary | ICD-10-CM

## 2020-10-12 DIAGNOSIS — E1151 Type 2 diabetes mellitus with diabetic peripheral angiopathy without gangrene: Secondary | ICD-10-CM

## 2020-10-12 DIAGNOSIS — IMO0002 Reserved for concepts with insufficient information to code with codable children: Secondary | ICD-10-CM

## 2020-10-12 NOTE — Patient Instructions (Addendum)
Visit Information  Goals Addressed            This Visit's Progress   . Chronic Care Management Pharmacy Care Plan       CARE PLAN ENTRY (see longitudinal plan of care for additional care plan information)  Current Barriers:  . Chronic Disease Management support, education, and care coordination needs related to Diabetes, Hypertension, Asthma, Depression   Hypertension BP Readings from Last 3 Encounters:  07/20/20 (!) 144/90  07/16/20 116/88  07/02/20 140/90   . Pharmacist Clinical Goal(s): o Over the next 180 days, patient will work with PharmD and providers to maintain BP goal <140/90 . Current regimen:   Amlodipine 10 mg daily  Clonidine 0.2mg  twice daily . Interventions: o Recommended patient purchase BP cuff and record readings 2-3 times per week . Patient self care activities - Over the next 180 days, patient will: o Check BP 2-3 times per week, document, and provide at future appointments o Ensure daily salt intake < 2300 mg/Heather Walker  Hyperlipidemia Lab Results  Component Value Date/Time   LDLCALC 89 07/16/2020 01:20 PM   LDLDIRECT 115.0 08/02/2018 03:25 PM   . Pharmacist Clinical Goal(s): o Over the next 180 days, patient will work with PharmD and providers to maintain LDL goal < 100 . Current regimen:  o Diet and exercise management   . Interventions: o Discussed diet and exercise . Patient self care activities - Over the next 180 days, patient will: o Maintain LDL less than 100  Diabetes Lab Results  Component Value Date/Time   HGBA1C 6.8 (A) 07/20/2020 09:59 AM   HGBA1C 8.7 (A) 03/03/2020 08:46 AM   HGBA1C 11.5 (H) 02/20/2019 01:16 PM   HGBA1C 9.8 (H) 08/02/2018 03:25 PM   . Pharmacist Clinical Goal(s): o Over the next 180 days, patient will work with PharmD and providers to maintain A1c goal <7% . Current regimen:   Novolin 70/30 25 units twice daily  Ozempic 0.25mg  weekly . Interventions: o Discussed diet and exercise  o Discussed option of  patient assistance for Ozempic . Patient self care activities - Over the next 180 days, patient will: o Check blood sugar once daily, document, and provide at future appointments o Contact provider with any episodes of hypoglycemia  Health Maintenance  . Pharmacist Clinical Goal(s) o Over the next 90 days, patient will work with PharmD and providers to complete health maintenance screenings/vaccinations . Interventions: o Discussed the following vaccines, but patient would like to decline - Flu vaccine - Covid vaccine booster - Shingrix vaccine o Discussed receiving colonoscopy and/or Cologuard colon cancer screening o Discussed receiving mammogram . Patient self care activities - Over the next 180 days, patient will: o Schedule mammogram o Arrange for Cologuard colon cancer screening and/or colonoscopy  Medication management . Pharmacist Clinical Goal(s): o Over the next 180 days, patient will work with PharmD and providers to maintain optimal medication adherence . Current pharmacy: [facility] . Interventions o Comprehensive medication review performed. o Continue current medication management strategy . Patient self care activities - Over the next 180 days, patient will: o Focus on medication adherence by filling and taking medications appropriately  o Take medications as prescribed o Report any questions or concerns to PharmD and/or provider(s)  Please see past updates related to this goal by clicking on the "Past Updates" button in the selected goal        The patient verbalized understanding of instructions, educational materials, and care plan provided today and agreed to receive a  mailed copy of patient instructions, educational materials, and care plan.   Telephone follow up appointment with pharmacy team member scheduled for: 04/12/2021  Dannielle Karvonen Heather Walker, [facility]

## 2020-10-12 NOTE — Chronic Care Management (AMB) (Signed)
Chronic Care Management Pharmacy  Name: Heather Walker  MRN: [id number] DOB: [birth year]  Chief Complaint/ HPI  Heather Walker,  59 y.o. , female presents for their Follow-Up CCM visit with the clinical pharmacist via telephone due to COVID-19 Pandemic.  PCP : Heather Held, DO  Their chronic conditions include: Diabetes, Hypertension, Asthma, Depression  Office Visits: 07/16/20: Visit w/ Dr. Etter Walker - Potassium is low--- eat more K rich foods--- bananas, oj, green leafy veg Recheck cmp 2 weeks. No med changes noted. RTC 3 months.   07/02/20: Visit w/ Dr. Etter Walker - R flank pain. No med changes noted.   Consult Visit: 07/20/20: Endo visit w/ Dr. Kelton Walker - A1c at goal (6.8%). No med changes made.   07/08/20: Neuro message to patient - EMG and nerve conduction velocity test within normal limits.  07/08/20: Neuro visit w/ Dr. Krista Walker - Conclusion: This is a normal study.  There is no electrodiagnostic evidence of large fiber peripheral neuropathy, but current study could not rule out the possibility of small fiber neuropathy due to intrinsic technical limitations.  Medications: Outpatient Encounter Medications as of 10/12/2020  Medication Sig  . amLODipine (NORVASC) 10 MG tablet Take 1 tablet (10 mg total) by mouth daily.  . cloNIDine (CATAPRES) 0.1 MG tablet Take 1 tablet (0.1 mg total) by mouth 2 (two) times daily.  Marland Kitchen glucose blood (ONETOUCH VERIO) test strip 1 each by Other route 2 (two) times daily. Use as instructed  . insulin isophane & regular human (NOVOLIN 70/30 FLEXPEN) (70-30) 100 UNIT/ML KwikPen Inject 22 Units into the skin 2 (two) times daily with a meal.  . Insulin Pen Needle 32G X 4 MM MISC Inject 1 Device into the skin in the morning and at bedtime. 2x daily  . Lancets (ONETOUCH ULTRASOFT) lancets Use as instructed to test blood sugar 2 times daily E11.65  . ondansetron (ZOFRAN ODT) 4 MG disintegrating tablet Take 1 tablet (4 mg total) by mouth every 8 (eight) hours as  needed for nausea or vomiting.  Heather Walker Delica Lancets [id number] MISC 1 Package by Does not apply route in the morning and at bedtime. Use as instructed 2 times daily E11.65  . Semaglutide,0.25 or 0.5MG /DOS, (OZEMPIC, 0.25 OR 0.5 MG/DOSE,) 2 MG/1.5ML SOPN Inject 0.5 mg into the skin once a week. (Patient taking differently: Inject 0.5 mg into the skin every Sunday. )  . triamcinolone cream (KENALOG) 0.1 % Apply 1 application topically 2 (two) times daily.  . Vitamin D, Ergocalciferol, (DRISDOL) 1.25 MG (50000 UNIT) CAPS capsule Take 1 capsule (50,000 Units total) by mouth every 7 (seven) days.   No facility-administered encounter medications on file as of 10/12/2020.   SDOH Screenings   Alcohol Screen: Not on file  Depression (PHQ2-9): Low Risk   . PHQ-2 Score: 0  Financial Resource Strain: Not on file  Food Insecurity: Not on file  Housing: Not on file  Physical Activity: Not on file  Social Connections: Not on file  Stress: Not on file  Tobacco Use: Low Risk   . Smoking Tobacco Use: Never Smoker  . Smokeless Tobacco Use: Never Used  Transportation Needs: Not on file     Current Diagnosis/Assessment:  Goals Addressed            This Visit's Progress   . Chronic Care Management Pharmacy Care Plan       CARE PLAN ENTRY (see longitudinal plan of care for additional care plan information)  Current Barriers:  .  Chronic Disease Management support, education, and care coordination needs related to Diabetes, Hypertension, Asthma, Depression   Hypertension BP Readings from Last 3 Encounters:  07/20/20 (!) 144/90  07/16/20 116/88  07/02/20 140/90   . Pharmacist Clinical Goal(s): o Over the next 180 days, patient will work with PharmD and providers to maintain BP goal <140/90 . Current regimen:   Amlodipine 10 mg daily  Clonidine 0.2mg  twice daily . Interventions: o Recommended patient purchase BP cuff and record readings 2-3 times per week . Patient self care activities -  Over the next 180 days, patient will: o Check BP 2-3 times per week, document, and provide at future appointments o Ensure daily salt intake < 2300 mg/Heather Walker  Hyperlipidemia Lab Results  Component Value Date/Time   LDLCALC 89 07/16/2020 01:20 PM   LDLDIRECT 115.0 08/02/2018 03:25 PM   . Pharmacist Clinical Goal(s): o Over the next 180 days, patient will work with PharmD and providers to maintain LDL goal < 100 . Current regimen:  o Diet and exercise management   . Interventions: o Discussed diet and exercise . Patient self care activities - Over the next 180 days, patient will: o Maintain LDL less than 100  Diabetes Lab Results  Component Value Date/Time   HGBA1C 6.8 (A) 07/20/2020 09:59 AM   HGBA1C 8.7 (A) 03/03/2020 08:46 AM   HGBA1C 11.5 (H) 02/20/2019 01:16 PM   HGBA1C 9.8 (H) 08/02/2018 03:25 PM   . Pharmacist Clinical Goal(s): o Over the next 180 days, patient will work with PharmD and providers to maintain A1c goal <7% . Current regimen:   Novolin 70/30 25 units twice daily  Ozempic 0.25mg  weekly . Interventions: o Discussed diet and exercise  o Discussed option of patient assistance for Ozempic . Patient self care activities - Over the next 180 days, patient will: o Check blood sugar once daily, document, and provide at future appointments o Contact provider with any episodes of hypoglycemia  Health Maintenance  . Pharmacist Clinical Goal(s) o Over the next 90 days, patient will work with PharmD and providers to complete health maintenance screenings/vaccinations . Interventions: o Discussed the following vaccines, but patient would like to decline - Flu vaccine - Covid vaccine booster - Shingrix vaccine o Discussed receiving colonoscopy and/or Cologuard colon cancer screening o Discussed receiving mammogram . Patient self care activities - Over the next 180 days, patient will: o Schedule mammogram o Arrange for Cologuard colon cancer screening and/or  colonoscopy  Medication management . Pharmacist Clinical Goal(s): o Over the next 180 days, patient will work with PharmD and providers to maintain optimal medication adherence . Current pharmacy: [facility] . Interventions o Comprehensive medication review performed. o Continue current medication management strategy . Patient self care activities - Over the next 180 days, patient will: o Focus on medication adherence by filling and taking medications appropriately  o Take medications as prescribed o Report any questions or concerns to PharmD and/or provider(s)  Please see past updates related to this goal by clicking on the "Past Updates" button in the selected goal       Social Hx:  Originally from [place].  Has 4 adult children who are local.  Married for 22 years. Patient's husband had bypass surgery in January and that has her very busy.   Diabetes   A1c goal <7% FBG: 80 - 130 PPBG: <180  Recent Relevant Labs: Lab Results  Component Value Date/Time   HGBA1C 6.8 (A) 07/20/2020 09:59 AM   HGBA1C 8.7 (A)  03/03/2020 08:46 AM   HGBA1C 11.5 (H) 02/20/2019 01:16 PM   HGBA1C 9.8 (H) 08/02/2018 03:25 PM   MICROALBUR <0.7 12/04/2019 03:07 PM   MICROALBUR 1.3 11/10/2016 01:07 PM    Checking BG: Daily  Recent FBG Readings: 211  210  265 due to food eaten 198  166  165  94 limited what she ate night before 160  143  184 Average  179.6   Patient has failed these meds in past: Metformin (GI), Jardiance (cost), Farxiga (neck pain) Patient is currently controlled on the following medications:   Novolin 70/30 25 units bid  Ozempic 0.25mg  weekly  Reports she is very adherent to taking Novolin 70/30  Last diabetic Eye exam:  Lab Results  Component Value Date/Time   HMDIABEYEEXA No Retinopathy 04/15/2019 12:00 AM    Last diabetic Foot exam: No results found for: HMDIABFOOTEX   Wakes up in the middle of the night and eats something sweet  Diet: Stays  away from starchy foods. No rice. Likes bread, especially corn bread and biscuits. Tries to stay B: Ham and egg biscuit from biscuit, grape soda L: Usually skips D: Greens with hammock, corn bread Drinks: Water  Exercise: Minimal currently  We discussed: Thoroughly diet by reviewing nutrition labels and noting that she should limit her carbohydrate intake to 30-45 grams per meal   Update 10/12/20 States she changed the way she ate. Reduced the amount of carbs eaten. Sees BG in 90s-100s Will get refill of Ozempic in January ($100 at last refill) We discussed options of patient assistance programs. Will send patient forms to be completed by Endo.  Plan -Limit carbohydrates to 30-45 grams of carbs per meal -Walk for 45 minutes 5 days a week -Continue current medications  -Complete patient assistance application and get Dr. Kelton Walker to complete necessary portions.  Hypertension   Blood pressure goal <140/90  CMP Latest Ref Rng & Units 07/16/2020 04/16/2020 04/15/2020  Glucose 65 - 99 mg/dL 131(H) 203(H) 150(H)  BUN 7 - 25 mg/dL 6(L) 13 17  Creatinine 0.50 - 1.05 mg/dL 0.75 1.04(H) 1.20(H)  Sodium 135 - 146 mmol/L 142 141 144  Potassium 3.5 - 5.3 mmol/L 3.1(L) 3.5 3.3(L)  Chloride 98 - 110 mmol/L 101 105 104  CO2 20 - 32 mmol/L 32 27 31  Calcium 8.6 - 10.4 mg/dL 8.8 8.2(L) 9.1  Total Protein 6.1 - 8.1 g/dL 6.8 - -  Total Bilirubin 0.2 - 1.2 mg/dL 0.4 - -  Alkaline Phos 39 - 117 U/L - - -  AST 10 - 35 U/L 12 - -  ALT 6 - 29 U/L 8 - -   Office blood pressures are  BP Readings from Last 3 Encounters:  07/20/20 (!) 144/90  07/16/20 116/88  07/02/20 140/90   Patient has failed these meds in the past: lisinopril 10mg , losartan 100mg , metoprolol tartrate 25mg  bid, metoprolol succinate 25mg  daily  Patient is currently controlled on the following medications:   Amlodipine 10 mg daily  Clonidine 0.2mg  twice daily  Patient states she took her last dose of amlodipine today and  needs a refill per her pharmacy.  Patient checks BP at home infrequently (machine broke, feels like she can't afford to purchase another one)  Patient home BP readings are ranging: N/A  We discussed Proper blood pressure measurement technique   Update 10/12/20 Has ordered BP cuff, but it hasn't received cuff yet.  Denies headaches, chest pains, and dizziness.  Plan -Continue current medications  -Check BP 2-3  times per week and record  Depression   Depression screen [facility] 2/9 10/12/2020 08/06/2018 11/10/2016  Decreased Interest 0 0 0  Down, Depressed, Hopeless 0 0 0  PHQ - 2 Score 0 0 0  Altered sleeping 0 0 -  Tired, decreased energy 0 0 -  Change in appetite 0 0 -  Feeling bad or failure about yourself  0 0 -  Trouble concentrating 0 0 -  Moving slowly or fidgety/restless 0 0 -  Suicidal thoughts 0 0 -  PHQ-9 Score 0 0 -  Some recent data might be hidden    Patient has failed these meds in past: None noted  Patient is currently controlled on the following medications:  . None  Plan -Continue current management  Hyperlipidemia   Lipid Panel     Component Value Date/Time   CHOL 174 07/16/2020 1320   TRIG 168 (H) 07/16/2020 1320   HDL 58 07/16/2020 1320   CHOLHDL 3.0 07/16/2020 1320   VLDL 34.4 02/20/2019 1053   LDLCALC 89 07/16/2020 1320   LDLDIRECT 115.0 08/02/2018 1525     The 10-year ASCVD risk score Mikey Bussing DC Jr., et al., 2013) is: 15.8%   Values used to calculate the score:     Age: 32 years     Sex: Female     Is Non-Hispanic African American: Yes     Diabetic: Yes     Tobacco smoker: No     Systolic Blood Pressure: 123456 mmHg     Is BP treated: Yes     HDL Cholesterol: 58 mg/dL     Total Cholesterol: 174 mg/dL   Patient has failed these meds in past: crestor (anaphylaxis), atorvastatin (neck stiffneses) Patient is currently controlled except for TG on the following medications:  None  We discussed:  diet and exercise extensively   Update  10/12/20 Update diagnosis code for statin intolerances.  Plan -Continue control with diet and exercise   Future Plan -If TG remains elevated consider adding fish oil   Vaccines/Health Maintenance   Reviewed and discussed patient's vaccination history.    Immunization History  Administered Date(s) Administered  . Influenza,inj,Quad PF,6+ Mos 07/25/2014, 10/05/2015, 10/05/2017  . PFIZER SARS-COV-2 Vaccination 01/09/2020, 02/03/2020  . Pneumococcal Polysaccharide-23 07/15/2014  . Td 04/12/2006  . Tdap 02/24/2011   Flu vaccine - declines covid vaccine booster - declines Shingrix - declines Colonoscopy - declines (states she has a hard time giving a sample) Mammogram - plans to schedule  Plan -Recommended patient schedule mammogram -Encouraged patient to complete cologuard  Melvenia Beam Arbutus Nelligan, PharmD, BCACP Clinical Pharmacist [facility] at [facility] [phone]

## 2020-10-19 ENCOUNTER — Encounter: Payer: Medicare HMO | Admitting: Family Medicine

## 2020-10-22 NOTE — Progress Notes (Deleted)
Name: Heather Walker  Age/ Sex: 60 y.o., female   MRN/ DOB: [id number], September 30, 1961     PCP: [name], DO   Reason for Endocrinology Evaluation: Type 2 Diabetes Mellitus  Initial Endocrine Consultative Visit: 09/26/2018    PATIENT IDENTIFIER: [name] with a past medical history of HTN, T2DM, GERD and Dyslipidemia . The patient has followed with Endocrinology clinic since 09/26/2018 for consultative assistance with management of her diabetes.  DIABETIC HISTORY:  [name] was diagnosed with T2DM in 2015. She has reported GI intolerance to Metformin. She was unable to try Jardiance due to insurance issues. Wilder Glade gave her neck pain. Her hemoglobin A1c has ranged from 8.5%  in 2016, peaking at 9.8% in 2019.   On her initial visit to our clinic her A1c 9.8%. She was not taking any of her prescribed medications. We stopped Glipizide and started Novolog mix.  She has declined a CDE referral  Ozempic started 02/2020    IN 03/2020 she presented to the ED with shoulder pain and chills, she had just received a steroid injection the prior day . CT abdomen was negative, MRI of shoulder showed adhesive capsulitis Due to dizziness and hypokalemia ( 2.7 mmol/L) , cortisol was checked and it was low at 1.9 ug/dL by 914AM , staff attempted to proceed with Cosyntropin stim. Test but the pt did not want to wait.  Of note her BP upon presentation was 163/116  mmhg  Repeat cortisol at our office was trending up but her ACTH was low and we started her on HC.   Pt eventually had a cosyntropin stim test with a 60 minute 9.7 ug/dL  She was started on [facility] in 04/2020  SUBJECTIVE:   During the last visit (07/20/2020): A1c 6.8 %. Continue Ozempic and insulin mix     Today (10/23/2020): [name] is here for a follow up on diabetes and secondary adrenal insufficiency . She checks her  Sugar occasionally.   She presented to the ED on 04/06/2020 with shoulder pain  and chills. CT abdomen was negative, MRI of shoulder showed adhesive capsulitis. She is S/P a shoulder intra-articular injection on 6/24th.  Due to dizziness and hypokalemia ( 2.7 mmol/L) , cortisol was checked and it was low at 1.9 ug/dL by 914AM , cosyntropin was abnormal.   Pt started on Hydrocortisone 04/2020 but she is not sure she is taking it      HOME ENDOCRINE REGIMEN:   Novolin Mix (70/30) 25 units BID   Ozempic 0.5 mg weekly     METER DOWNLOAD SUMMARY:  Overall Mean FS Glucose =159 Standard Deviation = 47  BG Ranges: Low = 103 High = 213    Hypoglycemic Events/30 Days: BG < 50 = 0 Episodes of symptomatic severe hypoglycemia = 0      DIABETIC COMPLICATIONS: Microvascular complications:   Denies: retinopathy , neuropathy  Last eye exam: Completed 01/2019  Macrovascular complications:   Denies: CAD, PVD, CVA      HISTORY:  Past Medical History:  Past Medical History:  Diagnosis Date  . Arthritis   . Asthma   . Depression   . Diabetes mellitus without complication ([place])   . Heart murmur   . Hypertension    Past Surgical History:  Past Surgical History:  Procedure Laterality Date  . ABDOMINAL HYSTERECTOMY    . APPENDECTOMY    . BLADDER SUSPENSION    . CESAREAN  SECTION     3 previous  . TEE WITHOUT CARDIOVERSION N/A 08/24/2017   Procedure: TRANSESOPHAGEAL ECHOCARDIOGRAM (TEE);  Surgeon: Laurey Morale, MD;  Location: [facility] ENDOSCOPY;  Service: Cardiovascular;  Laterality: N/A;  . TUBAL LIGATION      Social History:  reports that she has never smoked. She has never used smokeless tobacco. She reports that she does not drink alcohol and does not use drugs. Family History:  Family History  Problem Relation Age of Onset  . Hypertension Mother   . Asthma Mother   . Hypertension Sister   . Asthma Sister   . Diabetes Sister   . Hypertension Maternal Grandmother   . Heart defect Sister   . Asthma Sister   . Aneurysm Sister   . Asthma  Sister      HOME MEDICATIONS: Allergies as of 10/23/2020      Reactions   Crestor [rosuvastatin Calcium] Anaphylaxis   Metformin And Related Diarrhea   Nausea and diarrhea   Atorvastatin Other (See Comments)   Neck stiffness   Codeine Itching      Medication List       Accurate as of October 23, 2020  7:09 AM. If you have any questions, ask your nurse or doctor.        amLODipine 10 MG tablet Commonly known as: NORVASC Take 1 tablet (10 mg total) by mouth daily.   cloNIDine 0.1 MG tablet Commonly known as: Catapres Take 1 tablet (0.1 mg total) by mouth 2 (two) times daily.   Insulin Pen Needle 32G X 4 MM Misc Inject 1 Device into the skin in the morning and at bedtime. 2x daily   NovoLIN 70/30 FlexPen (70-30) 100 UNIT/ML KwikPen Generic drug: insulin isophane & regular human Inject 22 Units into the skin 2 (two) times daily with a meal.   ondansetron 4 MG disintegrating tablet Commonly known as: Zofran ODT Take 1 tablet (4 mg total) by mouth every 8 (eight) hours as needed for nausea or vomiting.   onetouch ultrasoft lancets Use as instructed to test blood sugar 2 times daily E11.65   OneTouch Delica Lancets 33G Misc 1 Package by Does not apply route in the morning and at bedtime. Use as instructed 2 times daily E11.65   OneTouch Verio test strip Generic drug: glucose blood 1 each by Other route 2 (two) times daily. Use as instructed   Ozempic (0.25 or 0.5 MG/DOSE) 2 MG/1.5ML Sopn Generic drug: Semaglutide(0.25 or 0.5MG /DOS) Inject 0.5 mg into the skin once a week. What changed: when to take this   triamcinolone 0.1 % Commonly known as: KENALOG Apply 1 application topically 2 (two) times daily.   Vitamin D (Ergocalciferol) 1.25 MG (50000 UNIT) Caps capsule Commonly known as: DRISDOL Take 1 capsule (50,000 Units total) by mouth every 7 (seven) days.        OBJECTIVE:   Vital Signs: There were no vitals taken for this visit.  Wt Readings from Last 3  Encounters:  07/20/20 202 lb 9.6 oz (91.9 kg)  07/16/20 198 lb 12.8 oz (90.2 kg)  07/02/20 197 lb 6.4 oz (89.5 kg)     Exam: General: Pt appears lethargic in a wheelchair   Lungs: Clear with good BS bilat with no rales, rhonchi, or wheezes  Heart: RRR   Extremities: No pretibial edema.   Neuro: MS is good with appropriate affect, pt is alert and Ox3    DM foot exam: 07/20/2020 The skin of the feet is intact without  sores or ulcerations. The pedal pulses are 2+ on right and 2+ on left. The sensation is intact to a screening 5.07, 10 gram monofilament bilaterally         DATA REVIEWED:  Lab Results  Component Value Date   HGBA1C 6.8 (A) 07/20/2020   HGBA1C 8.7 (A) 03/03/2020   HGBA1C 12.0 (A) 12/04/2019   Lab Results  Component Value Date   MICROALBUR <0.7 12/04/2019   LDLCALC 89 07/16/2020   CREATININE 0.75 07/16/2020   Lab Results  Component Value Date   MICRALBCREAT 1.6 12/04/2019     Lab Results  Component Value Date   CHOL 174 07/16/2020   HDL 58 07/16/2020   LDLCALC 89 07/16/2020   LDLDIRECT 115.0 08/02/2018   TRIG 168 (H) 07/16/2020   CHOLHDL 3.0 07/16/2020        ASSESSMENT / PLAN / RECOMMENDATIONS:   1) Type 2 Diabetes Mellitus, Improving Glycemic Control, Without complications - Most recent A1c of  6.8%. Goal A1c < 7.0 %.    -She has been tolerating Ozempic  - A1c at goal  - I have praised the pt on optimal glycemic control  - No changes     MEDICATIONS:   Continue Ozempic 0.5 mg weekly for now  Continue Novolin mix (70/30) 25 units BID before breakfast and supper   EDUCATION / INSTRUCTIONS:  BG monitoring instructions: Patient is instructed to check her blood sugars 2 times a day, fasting and supper time.  Call [place] [facility] if: BG persistently < 70  . I reviewed the Rule of 15 for the treatment of hypoglycemia in detail with the patient. Literature supplied.   2) Diabetic complications:   Eye: Does not have  known diabetic retinopathy.   Neuro/ Feet: Does not have known diabetic peripheral neuropathy .   Renal: Patient does not have known baseline CKD, but has a low GFR for the first time , will continue to monitor. She   is  on an ACEI/ARB at present.   3) Low serum cortisol:  - She had an abnormal Cosyntropin stim test a few days after a shoulder intra-articular injection. She was started on HC back in 04/2020 due to low ACTH and non-specific symptom. I have called the pharmacy on 07/21/2020 and confirmed her last prescription was picked up on 04/16/2020 for a 30 days supply.  Pt has not used HC since 05/2020, will check ACTH , cortisol is normal today    F/U in 3 months    Signed electronically by: Mack Guise, MD  [facility] Endocrinology  [facility] [address] [place], [place] [address] Phone: [phone] FAX: [phone]   CC: [name], DO [address] [place] [address] Phone: [phone]  Fax: [phone]  Return to Endocrinology clinic as below: Future Appointments  Date Time Provider [place]  10/23/2020 11:10 AM [place] Arocho, Melanie Crazier, MD [facility] None  04/12/2021  1:00 PM [facility] PHARMACIST [facility]

## 2020-10-23 ENCOUNTER — Ambulatory Visit: Payer: Medicare HMO | Admitting: Internal Medicine

## 2020-11-20 ENCOUNTER — Other Ambulatory Visit: Payer: Self-pay | Admitting: Family Medicine

## 2020-11-20 DIAGNOSIS — Z1231 Encounter for screening mammogram for malignant neoplasm of breast: Secondary | ICD-10-CM

## 2020-12-15 ENCOUNTER — Other Ambulatory Visit: Payer: Self-pay | Admitting: Family Medicine

## 2020-12-15 DIAGNOSIS — I10 Essential (primary) hypertension: Secondary | ICD-10-CM

## 2020-12-16 ENCOUNTER — Other Ambulatory Visit: Payer: Self-pay

## 2020-12-16 DIAGNOSIS — I10 Essential (primary) hypertension: Secondary | ICD-10-CM

## 2020-12-16 MED ORDER — AMLODIPINE BESYLATE 10 MG PO TABS: 10.0000 mg | ORAL_TABLET | Freq: Every day | ORAL | 0 refills | Status: DC

## 2020-12-28 ENCOUNTER — Telehealth: Payer: Self-pay | Admitting: Internal Medicine

## 2020-12-28 DIAGNOSIS — E1165 Type 2 diabetes mellitus with hyperglycemia: Secondary | ICD-10-CM

## 2020-12-28 MED ORDER — ONETOUCH VERIO VI STRP: 1.0000 | ORAL_STRIP | Freq: Two times a day (BID) | 5 refills | Status: DC

## 2020-12-28 NOTE — Telephone Encounter (Signed)
MEDICATION: glucose blood (ONETOUCH VERIO) test strip  PHARMACY:   [facility] [id number] - [place], [place] AT [address] Phone:  [phone]  Fax:  [phone]       HAS THE PATIENT CONTACTED THEIR PHARMACY?  Yes  IS THIS A 90 DAY SUPPLY : Yes  IS PATIENT OUT OF MEDICATION: Yes  IF NOT; HOW MUCH IS LEFT: 0  LAST APPOINTMENT DATE: @1 /04/2021  NEXT APPOINTMENT DATE:@3 /16/2022  DO WE HAVE YOUR PERMISSION TO LEAVE A DETAILED MESSAGE?:Yes  OTHER COMMENTS:    **Let patient know to contact pharmacy at the end of the day to make sure medication is ready. **  ** Please notify patient to allow 48-72 hours to process**  **Encourage patient to contact the pharmacy for refills or they can request refills through [facility]**

## 2020-12-28 NOTE — Telephone Encounter (Signed)
Rx have been sent 

## 2020-12-29 MED ORDER — ONETOUCH VERIO VI STRP: ORAL_STRIP | 5 refills | Status: DC

## 2020-12-29 NOTE — Addendum Note (Signed)
Addended by: Jacqualin Combes on: 12/29/2020 01:10 PM   Modules accepted: Orders

## 2020-12-29 NOTE — Progress Notes (Signed)
Name: Heather Walker  Age/ Sex: 60 y.o., female   MRN/ DOB: [id number], [birth year]     PCP: [name], DO   Reason for Endocrinology Evaluation: Type 2 Diabetes Mellitus  Initial Endocrine Consultative Visit: 09/26/2018    PATIENT IDENTIFIER: Heather Walker is a 60 y.o. female with a past medical history of HTN, T2DM, GERD and Dyslipidemia . The patient has followed with Endocrinology clinic since 09/26/2018 for consultative assistance with management of her diabetes.  DIABETIC HISTORY:  Heather Walker was diagnosed with T2DM in 2015. She has reported GI intolerance to Metformin. She was unable to try Jardiance due to insurance issues. Heather Walker gave her neck pain. Her hemoglobin A1c has ranged from 8.5%  in 2016, peaking at 9.8% in 2019.   On her initial visit to our clinic her A1c 9.8%. She was not taking any of her prescribed medications. We stopped Glipizide and started Novolog mix.  She has declined a CDE referral  Ozempic started 02/2020    IN 03/2020 she presented to the ED with shoulder pain and chills, she had just received a steroid injection the prior day . CT abdomen was negative, MRI of shoulder showed adhesive capsulitis Due to dizziness and hypokalemia ( 2.7 mmol/L) , cortisol was checked and it was low at 1.9 ug/dL by 914AM , staff attempted to proceed with Cosyntropin stim. Test but the pt did not want to wait.  Of note her BP upon presentation was 163/116  mmhg  Repeat cortisol at our office was trending up but her ACTH was low and we started her on HC.   Pt eventually had a cosyntropin stim test with a 60 minute 9.7 ug/dL  She was started on [facility] in 04/2020 but took this for only a month   Cosyntropin stim test normal with a 60 minute cortisol at 27.8 ug/dL  ( 12/2020)  SUBJECTIVE:   During the last visit (07/20/2020): A1c 6.5 %. Continued Ozempic and  insulin mix     Today (12/30/2020): Heather Walker is here for a follow up on diabetes and secondary  adrenal insufficiency . She checks her sugar daily , did not bring the meter today.   She stopped insulin due to cost but has remained on the Ozempic   Denies nausea or diarrhea  She denies using any steroids in the past 3 months.      HOME ENDOCRINE REGIMEN:   Novolin Mix (70/30) 25 units BID   Ozempic 0.5 mg weekly     METER DOWNLOAD SUMMARY:  Did not bring     DIABETIC COMPLICATIONS: Microvascular complications:   Denies: retinopathy , neuropathy  Last eye exam: Completed 01/2019  Macrovascular complications:   Denies: CAD, PVD, CVA      HISTORY:  Past Medical History:  Past Medical History:  Diagnosis Date  . Arthritis   . Asthma   . Depression   . Diabetes mellitus without complication ([place])   . Heart murmur   . Hypertension    Past Surgical History:  Past Surgical History:  Procedure Laterality Date  . ABDOMINAL HYSTERECTOMY    . APPENDECTOMY    . BLADDER SUSPENSION    . CESAREAN SECTION     3 previous  . TEE WITHOUT CARDIOVERSION N/A 08/24/2017   Procedure: TRANSESOPHAGEAL ECHOCARDIOGRAM (TEE);  Surgeon: Larey Dresser, MD;  Location: [facility] ENDOSCOPY;  Service: Cardiovascular;  Laterality: N/A;  . TUBAL LIGATION      Social History:  reports that she has never smoked. She has never used smokeless tobacco. She reports that she does not drink alcohol and does not use drugs. Family History:  Family History  Problem Relation Age of Onset  . Hypertension Mother   . Asthma Mother   . Hypertension Sister   . Asthma Sister   . Diabetes Sister   . Hypertension Maternal Grandmother   . Heart defect Sister   . Asthma Sister   . Aneurysm Sister   . Asthma Sister      HOME MEDICATIONS: Allergies as of 12/30/2020      Reactions   Crestor [rosuvastatin Calcium] Anaphylaxis   Metformin And Related Diarrhea   Nausea and diarrhea   Atorvastatin Other (See Comments)   Neck stiffness   Codeine Itching      Medication List       Accurate as  of December 30, 2020 12:39 PM. If you have any questions, ask your nurse or doctor.        amLODipine 10 MG tablet Commonly known as: NORVASC Take 1 tablet (10 mg total) by mouth daily.   cloNIDine 0.1 MG tablet Commonly known as: Catapres Take 1 tablet (0.1 mg total) by mouth 2 (two) times daily.   Insulin Pen Needle 32G X 4 MM Misc Inject 1 Device into the skin in the morning and at bedtime. 2x daily   NovoLIN 70/30 FlexPen (70-30) 100 UNIT/ML KwikPen Generic drug: insulin isophane & regular human Inject 22 Units into the skin 2 (two) times daily with a meal.   ondansetron 4 MG disintegrating tablet Commonly known as: Zofran ODT Take 1 tablet (4 mg total) by mouth every 8 (eight) hours as needed for nausea or vomiting.   onetouch ultrasoft lancets Use as instructed to test blood sugar 2 times daily N56.21   OneTouch Delica Lancets 30Q Misc 1 Package by Does not apply route in the morning and at bedtime. Use as instructed 2 times daily E11.65   OneTouch Verio test strip Generic drug: glucose blood Use as instructed to check blood sugar 2 times daily   Ozempic (0.25 or 0.5 MG/DOSE) 2 MG/1.5ML Sopn Generic drug: Semaglutide(0.25 or 0.5MG /DOS) Inject 0.5 mg into the skin once a week. What changed: when to take this   triamcinolone 0.1 % Commonly known as: KENALOG Apply 1 application topically 2 (two) times daily.   Vitamin D (Ergocalciferol) 1.25 MG (50000 UNIT) Caps capsule Commonly known as: DRISDOL Take 1 capsule (50,000 Units total) by mouth every 7 (seven) days.        OBJECTIVE:   Vital Signs: BP 140/86   Pulse 77   Ht 5\' 3"  (1.6 m)   Wt 198 lb 4 oz (89.9 kg)   SpO2 98%   BMI 35.12 kg/m   Wt Readings from Last 3 Encounters:  12/30/20 198 lb 4 oz (89.9 kg)  07/20/20 202 lb 9.6 oz (91.9 kg)  07/16/20 198 lb 12.8 oz (90.2 kg)     Exam: General: Pt appears well and not in distress  Lungs: Clear with good BS bilat   Heart: RRR   Extremities: No  pretibial edema.   Neuro: MS is good with appropriate affect, pt is alert and Ox3    DM foot exam: 07/20/2020 The skin of the feet is intact without sores or ulcerations. The pedal pulses are 2+ on right and 2+ on left. The sensation is intact to a screening 5.07, 10 gram monofilament bilaterally  DATA REVIEWED:  Lab Results  Component Value Date   HGBA1C 8.1 (A) 12/30/2020   HGBA1C 6.8 (A) 07/20/2020   HGBA1C 8.7 (A) 03/03/2020   Lab Results  Component Value Date   MICROALBUR <0.7 12/04/2019   LDLCALC 89 07/16/2020   CREATININE 0.75 07/16/2020   Lab Results  Component Value Date   MICRALBCREAT 1.6 12/04/2019     Lab Results  Component Value Date   CHOL 174 07/16/2020   HDL 58 07/16/2020   LDLCALC 89 07/16/2020   LDLDIRECT 115.0 08/02/2018   TRIG 168 (H) 07/16/2020   CHOLHDL 3.0 07/16/2020      Results for VIENO, TARRANT (MRN [id number]) as of 12/31/2020 12:55  Ref. Range 12/30/2020 11:15 12/30/2020 11:47 12/30/2020 12:17  Cortisol, Plasma Latest Units: ug/dL 11.6 27.6 27.8    ASSESSMENT / PLAN / RECOMMENDATIONS:   1) Type 2 Diabetes Mellitus, Poorly Controlled , Without complications - Most recent A1c of  8.1 %. Goal A1c < 7.0 %.     - Pt with hyperglycemia because she stopped taking insulin . She is having financial difficulty and opted to pay for the Ozempic rather then the insulin  - I have provided her with patient assistance program papers to see if she would qualify for insulin and a GLP-1 agonist.  - IN the meantime she was advised to hold off on Ozempic and use the insulin      MEDICATIONS:   Hold Ozempic for now   Restart  Novolin mix (70/30) 25 units BID before breakfast and supper   EDUCATION / INSTRUCTIONS:  BG monitoring instructions: Patient is instructed to check her blood sugars 2 times a day, fasting and supper time.  Call [place] [facility] if: BG persistently < 70  . I reviewed the Rule of 15 for the treatment of  hypoglycemia in detail with the patient. Literature supplied.   2) Diabetic complications:   Eye: Does not have known diabetic retinopathy.   Neuro/ Feet: Does not have known diabetic peripheral neuropathy .   Renal: Patient does not have known baseline CKD, but has a low GFR for the first time , will continue to monitor. She   is  on an ACEI/ARB at present.   3) Secondary Adrenal Insufficiency: - She had an abnormal Cosyntropin stim test a few days after a shoulder intra-articular injection. She was started on [facility] back in 04/2020 due to low ACTH and non-specific symptom but took this for only a month.  -  She had a normal  ACTH , cortisol level, while off HC and repeat Cosyntropin stimulation test was normal with a 60 minute cortisol at 27.8 ug/dL  ( 12/2020)  F/U in 4 months    Signed electronically by: Mack Guise, MD  [facility] Endocrinology  [facility] [address] [place], [place] [address] Phone: [phone] FAX: [phone]   CC: [name], DO [address] [place] [address] Phone: [phone]  Fax: [phone]  Return to Endocrinology clinic as below: Future Appointments  Date Time Provider [place]  01/07/2021 11:40 AM [facility] MM 3 [facility]  04/12/2021  1:00 PM [facility] PHARMACIST [facility]

## 2020-12-30 ENCOUNTER — Ambulatory Visit (INDEPENDENT_AMBULATORY_CARE_PROVIDER_SITE_OTHER): Payer: Medicare HMO | Admitting: Internal Medicine

## 2020-12-30 ENCOUNTER — Other Ambulatory Visit: Payer: Self-pay

## 2020-12-30 ENCOUNTER — Encounter: Payer: Self-pay | Admitting: Internal Medicine

## 2020-12-30 VITALS — BP 140/86 | HR 77 | Ht 63.0 in | Wt 198.2 lb

## 2020-12-30 DIAGNOSIS — E2740 Unspecified adrenocortical insufficiency: Secondary | ICD-10-CM | POA: Diagnosis not present

## 2020-12-30 DIAGNOSIS — E1165 Type 2 diabetes mellitus with hyperglycemia: Secondary | ICD-10-CM | POA: Diagnosis not present

## 2020-12-30 DIAGNOSIS — R7989 Other specified abnormal findings of blood chemistry: Secondary | ICD-10-CM

## 2020-12-30 LAB — CORTISOL
Cortisol, Plasma: 11.6 ug/dL
Cortisol, Plasma: 27.6 ug/dL
Cortisol, Plasma: 27.8 ug/dL

## 2020-12-30 LAB — POCT GLUCOSE (DEVICE FOR HOME USE): POC Glucose: 140 mg/dl — AB (ref 70–99)

## 2020-12-30 LAB — POCT GLYCOSYLATED HEMOGLOBIN (HGB A1C): Hemoglobin A1C: 8.1 % — AB (ref 4.0–5.6)

## 2020-12-30 MED ORDER — COSYNTROPIN 0.25 MG IJ SOLR
0.2500 mg | Freq: Once | INTRAMUSCULAR | Status: AC
  Administered 2020-12-30: 0.25 mg via INTRAMUSCULAR

## 2020-12-30 NOTE — Patient Instructions (Signed)
-   ResTART   Novolin Mix to 25 units with Breakfast and 25 units with Supper        HOW TO TREAT LOW BLOOD SUGARS (Blood sugar LESS THAN 70 MG/DL)  Please follow the RULE OF 15 for the treatment of hypoglycemia treatment (when your (blood sugars are less than 70 mg/dL)    STEP 1: Take 15 grams of carbohydrates when your blood sugar is low, which includes:   3-4 GLUCOSE TABS  OR  3-4 OZ OF JUICE OR REGULAR SODA OR  ONE TUBE OF GLUCOSE GEL     STEP 2: RECHECK blood sugar in 15 MINUTES STEP 3: If your blood sugar is still low at the 15 minute recheck --> then, go back to STEP 1 and treat AGAIN with another 15 grams of carbohydrates.

## 2020-12-31 ENCOUNTER — Encounter: Payer: Self-pay | Admitting: Internal Medicine

## 2021-01-07 ENCOUNTER — Ambulatory Visit: Payer: Medicare HMO

## 2021-01-08 ENCOUNTER — Telehealth: Payer: Self-pay | Admitting: Internal Medicine

## 2021-01-08 NOTE — Telephone Encounter (Signed)
Please advise. [facility] pharmacy ([facility]) need Rx sent for Humalog Mix 45/99 and Trulicity.  Please advise.

## 2021-01-11 ENCOUNTER — Other Ambulatory Visit: Payer: Self-pay | Admitting: Internal Medicine

## 2021-01-11 MED ORDER — TRULICITY 1.5 MG/0.5ML ~~LOC~~ SOAJ: 1.5000 mg | SUBCUTANEOUS | 3 refills | Status: DC

## 2021-01-11 MED ORDER — INSULIN LISPRO PROT & LISPRO (75-25 MIX) 100 UNIT/ML KWIKPEN: 25.0000 [IU] | PEN_INJECTOR | Freq: Two times a day (BID) | SUBCUTANEOUS | 3 refills | Status: DC

## 2021-01-11 NOTE — Telephone Encounter (Signed)
So sorry, I did not explain.  [facility] [employer] is [facility] pharmacy. They requested Rx to be e-scribe for  Humalog Mix 96/11 and Trulicity.  The application have already been sent to scan so I could not confirm.

## 2021-01-11 NOTE — Telephone Encounter (Signed)
A regular Rx or the [facility] order form ? Please give me one and I will fill

## 2021-01-21 ENCOUNTER — Telehealth: Payer: Self-pay | Admitting: Internal Medicine

## 2021-01-21 ENCOUNTER — Other Ambulatory Visit: Payer: Self-pay | Admitting: Family Medicine

## 2021-01-21 DIAGNOSIS — R109 Unspecified abdominal pain: Secondary | ICD-10-CM

## 2021-01-21 NOTE — Telephone Encounter (Signed)
New message    Patient calling back to check on the status of 01/08/21 messages.    [facility] saying they did not know the name of the medication.   Please advise

## 2021-01-21 NOTE — Telephone Encounter (Signed)
I have called [facility] regarding Trulicity and Humalog 41/28. I was told shipment should be delivered on Friday 01/22/2021. Also next shipment to be ship out on June 4 and June 6  Patient have been notified.

## 2021-01-27 ENCOUNTER — Telehealth: Payer: Self-pay | Admitting: Internal Medicine

## 2021-01-27 MED ORDER — INSULIN PEN NEEDLE 32G X 4 MM MISC: 5 refills | Status: DC

## 2021-01-27 NOTE — Telephone Encounter (Signed)
Pen Needles have been re-sent to [facility]

## 2021-02-23 ENCOUNTER — Other Ambulatory Visit: Payer: Self-pay | Admitting: Internal Medicine

## 2021-02-26 ENCOUNTER — Ambulatory Visit: Payer: Medicare HMO

## 2021-03-08 ENCOUNTER — Telehealth: Payer: Self-pay | Admitting: Pharmacist

## 2021-03-08 NOTE — Chronic Care Management (AMB) (Signed)
Chronic Care Management Pharmacy Assistant   Name: Heather Walker  MRN: [id number] DOB: [birth year]   Reason for Encounter: Disease State Diabetes Mellitus    Recent office visits:   None noted  Recent consult visits:   12/30/2020 Nena Jordan, MD (Endocrinology) Diabetic follow up. Hold Ozempic for now. Restart  Novolin mix (70/30) 25 units BID before breakfast and supper. Follow up in 4 months.   Hospital visits:  None in previous 6 months  Medications: Outpatient Encounter Medications as of 03/08/2021  Medication Sig  . amLODipine (NORVASC) 10 MG tablet Take 1 tablet (10 mg total) by mouth daily.  . cloNIDine (CATAPRES) 0.1 MG tablet Take 1 tablet (0.1 mg total) by mouth 2 (two) times daily.  . Dulaglutide (TRULICITY) 1.5 XH/3.7JI SOPN Inject 1.5 mg into the skin once a week.  . Insulin Lispro Prot & Lispro (HUMALOG MIX 75/25 KWIKPEN) (75-25) 100 UNIT/ML Kwikpen Inject 25 Units into the skin 2 (two) times daily.  . Insulin Pen Needle 32G X 4 MM MISC Use as instructed to inject insulin daily  . Lancets (ONETOUCH DELICA PLUS [id number]) MISC Use as instructed to check blood sugar 2 times daily  . Lancets (ONETOUCH ULTRASOFT) lancets Use as instructed to test blood sugar 2 times daily E11.65  . ondansetron (ZOFRAN ODT) 4 MG disintegrating tablet Take 1 tablet (4 mg total) by mouth every 8 (eight) hours as needed for nausea or vomiting.  [name] VERIO test strip Use as instructed to check blood sugar 2 times daily  . Semaglutide,0.25 or 0.5MG /DOS, (OZEMPIC, 0.25 OR 0.5 MG/DOSE,) 2 MG/1.5ML SOPN Inject 0.5 mg into the skin once a week. (Patient taking differently: Inject 0.5 mg into the skin every Sunday.)  . triamcinolone cream (KENALOG) 0.1 % Apply 1 application topically 2 (two) times daily.  . Vitamin D, Ergocalciferol, (DRISDOL) 1.25 MG (50000 UNIT) CAPS capsule Take 1 capsule (50,000 Units total) by mouth every 7 (seven) days.   No facility-administered encounter  medications on file as of 03/08/2021.   Recent Relevant Labs: Lab Results  Component Value Date/Time   HGBA1C 8.1 (A) 12/30/2020 11:02 AM   HGBA1C 6.8 (A) 07/20/2020 09:59 AM   HGBA1C 11.5 (H) 02/20/2019 01:16 PM   HGBA1C 9.8 (H) 08/02/2018 03:25 PM   MICROALBUR <0.7 12/04/2019 03:07 PM   MICROALBUR 1.3 11/10/2016 01:07 PM    Kidney Function Lab Results  Component Value Date/Time   CREATININE 0.75 07/16/2020 01:20 PM   CREATININE 1.04 (H) 04/16/2020 12:14 PM   CREATININE 1.20 (H) 04/15/2020 06:10 PM   GFR 66.72 04/14/2020 04:01 PM   GFRNONAA 60 (L) 04/16/2020 12:14 PM   GFRAA >60 04/16/2020 12:14 PM    . Current antihyperglycemic regimen:  o Trulicity 1.5 mg 0.5 ml inject 1.5mg  once a week o Humalog mix 75/25 inject 25 units twice daily  . What recent interventions/DTPs have been made to improve glycemic control:  o Hold on Ozempic on 12/30/2020 . Have there been any recent hospitalizations or ED visits since last visit with CPP? No   . Patient  hypoglycemic symptoms, including    . Patient  hyperglycemic symptoms, including   . How often are you checking your blood sugar?   . What are your blood sugars ranging?  o Fasting:  o Before meals:  o After meals:  o Bedtime:  . During the week, how often does your blood glucose drop below 70?  Marland Kitchen Are you checking your feet daily/regularly?   Adherence  Review: Is the patient currently on a STATIN medication? No Is the patient currently on ACE/ARB medication? No Does the patient have >5 day gap between last estimated fill dates? No   Star Rating Drugs: Trulicity 1.5mg /0.30ml Last filled:None noted Ozempic 2mg /1.32ml Last filled: 12/20/2020 28 DS  Left voicemail x 3. Unable to reach patient.  Lizbeth Bark Clinical Pharmacist Assistant [phone]

## 2021-03-17 ENCOUNTER — Other Ambulatory Visit: Payer: Self-pay

## 2021-03-17 DIAGNOSIS — I10 Essential (primary) hypertension: Secondary | ICD-10-CM

## 2021-03-17 MED ORDER — AMLODIPINE BESYLATE 10 MG PO TABS: 10.0000 mg | ORAL_TABLET | Freq: Every day | ORAL | 0 refills | Status: DC

## 2021-04-12 ENCOUNTER — Telehealth: Payer: Medicare PPO

## 2021-04-14 ENCOUNTER — Telehealth: Payer: Medicare PPO

## 2021-04-20 ENCOUNTER — Telehealth: Payer: Medicare PPO

## 2021-04-23 ENCOUNTER — Telehealth: Payer: Medicare PPO

## 2021-05-04 ENCOUNTER — Telehealth: Payer: Self-pay | Admitting: Pharmacist

## 2021-05-04 NOTE — Chronic Care Management (AMB) (Signed)
    Chronic Care Management Pharmacy Assistant   Name: Heather Walker  MRN: [id number] DOB: 1960/11/07  Reason for Encounter: General Adherence/ CPP Visit Reschedule Call  Recent office visits:  No visits noted   Recent consult visits:  No visits noted   Hospital visits:  None in previous 6 months  Medications: Outpatient Encounter Medications as of 05/04/2021  Medication Sig   amLODipine (NORVASC) 10 MG tablet Take 1 tablet (10 mg total) by mouth daily.   cloNIDine (CATAPRES) 0.1 MG tablet Take 1 tablet (0.1 mg total) by mouth 2 (two) times daily.   Dulaglutide (TRULICITY) 1.5 OF/7.5ZW SOPN Inject 1.5 mg into the skin once a week.   Insulin Lispro Prot & Lispro (HUMALOG MIX 75/25 KWIKPEN) (75-25) 100 UNIT/ML Kwikpen Inject 25 Units into the skin 2 (two) times daily.   Insulin Pen Needle 32G X 4 MM MISC Use as instructed to inject insulin daily   Lancets (ONETOUCH DELICA PLUS [id number]) MISC Use as instructed to check blood sugar 2 times daily   Lancets (ONETOUCH ULTRASOFT) lancets Use as instructed to test blood sugar 2 times daily E11.65   ondansetron (ZOFRAN ODT) 4 MG disintegrating tablet Take 1 tablet (4 mg total) by mouth every 8 (eight) hours as needed for nausea or vomiting.   ONETOUCH VERIO test strip Use as instructed to check blood sugar 2 times daily   Semaglutide,0.25 or 0.5MG /DOS, (OZEMPIC, 0.25 OR 0.5 MG/DOSE,) 2 MG/1.5ML SOPN Inject 0.5 mg into the skin once a week. (Patient taking differently: Inject 0.5 mg into the skin every Sunday.)   triamcinolone cream (KENALOG) 0.1 % Apply 1 application topically 2 (two) times daily.   Vitamin D, Ergocalciferol, (DRISDOL) 1.25 MG (50000 UNIT) CAPS capsule Take 1 capsule (50,000 Units total) by mouth every 7 (seven) days.   No facility-administered encounter medications on file as of 05/04/2021.   Several unsuccessful attempts made to contact patient    Have you had any problems recently with your health?   Have you had any  problems with your pharmacy?   What issues or side effects are you having with your medications?   What would you like me to pass along to [employer] ,CPP for them to help you with?    What can we do to take care of you better?   Star Rating Drugs: Trulicity 1.5 UM/3.5 mL Ozempic 0.25/0.5 mg- 28 DS last filled 12/20/20  Wilford Sports CPA, CMA

## 2021-06-07 ENCOUNTER — Other Ambulatory Visit: Payer: Self-pay | Admitting: Family Medicine

## 2021-06-07 ENCOUNTER — Other Ambulatory Visit: Payer: Self-pay

## 2021-06-07 ENCOUNTER — Telehealth (INDEPENDENT_AMBULATORY_CARE_PROVIDER_SITE_OTHER): Payer: Medicare PPO | Admitting: Family Medicine

## 2021-06-07 DIAGNOSIS — R739 Hyperglycemia, unspecified: Secondary | ICD-10-CM | POA: Diagnosis not present

## 2021-06-07 DIAGNOSIS — U071 COVID-19: Secondary | ICD-10-CM

## 2021-06-07 MED ORDER — PROMETHAZINE-DM 6.25-15 MG/5ML PO SYRP: 2.5000 mL | ORAL_SOLUTION | Freq: Three times a day (TID) | ORAL | 0 refills | Status: DC | PRN

## 2021-06-07 MED ORDER — MOLNUPIRAVIR EUA 200MG CAPSULE: 4.0000 | ORAL_CAPSULE | Freq: Two times a day (BID) | ORAL | 0 refills | Status: AC

## 2021-06-07 MED ORDER — ALBUTEROL SULFATE HFA 108 (90 BASE) MCG/ACT IN AERS: 2.0000 | INHALATION_SPRAY | Freq: Four times a day (QID) | RESPIRATORY_TRACT | 1 refills | Status: DC | PRN

## 2021-06-07 NOTE — Progress Notes (Signed)
MyChart Video Visit    Virtual Visit via Video Note   This visit type was conducted due to national recommendations for restrictions regarding the COVID-19 Pandemic (e.g. social distancing) in an effort to limit this patient's exposure and mitigate transmission in our community. This patient is at least at moderate risk for complications without adequate follow up. This format is felt to be most appropriate for this patient at this time. Physical exam was limited by quality of the video and audio technology used for the visit. [name], CMA was able to get the patient set up on a video visit.  Patient location: Home Patient and provider in visit Provider location: Office  I discussed the limitations of evaluation and management by telemedicine and the availability of in person appointments. The patient expressed understanding and agreed to proceed.  Visit Date: 06/07/2021  Today's healthcare provider: Penni Homans, MD      Subjective:    Patient ID: Heather Walker, female    DOB: [birth year], 60 y.o.   MRN: [id number]  Chief Complaint  Patient presents with   Covid Positive    Symptoms started Saturday and tested positive    HPI Patient is in today for a video visit.  Patient is Covid-19 positive.   She was babysitting her 59-monthold grandchild who was Covid positive. She started feeling the symptoms on Thursday but they worsened on Saturday.  She has 2 Pfizer Covid-19 vaccines at this time.  Past Medical History:  Diagnosis Date   Arthritis    Asthma    Depression    Diabetes mellitus without complication (HCuming    Heart murmur    Hypertension     Past Surgical History:  Procedure Laterality Date   ABDOMINAL HYSTERECTOMY     APPENDECTOMY     BLADDER SUSPENSION     CESAREAN SECTION     3 previous   TEE WITHOUT CARDIOVERSION N/A 08/24/2017   Procedure: TRANSESOPHAGEAL ECHOCARDIOGRAM (TEE);  Surgeon: MLarey Dresser MD;  [facility];  Service:  Cardiovascular;  Laterality: N/A;   TUBAL LIGATION      Family History  Problem Relation Age of Onset   Hypertension Mother    Asthma Mother    Hypertension Sister    Asthma Sister    Diabetes Sister    Hypertension Maternal Grandmother    Heart defect Sister    Asthma Sister    Aneurysm Sister    Asthma Sister     Social History   Socioeconomic History   Marital status: Married    Spouse name: Not on file   Number of children: Not on file   Years of education: ged   Highest education level: Not on file  Occupational History   Occupation: disabled  Tobacco Use   Smoking status: Never   Smokeless tobacco: Never  Vaping Use   Vaping Use: Never used  Substance and Sexual Activity   Alcohol use: No   Drug use: No   Sexual activity: Yes    Partners: Male    Birth control/protection: Surgical  Other Topics Concern   Not on file  Social History Narrative   Not on file   Social Determinants of Health   Financial Resource Strain: Not on file  Food Insecurity: Not on file  Transportation Needs: Not on file  Physical Activity: Not on file  Stress: Not on file  Social Connections: Not on file  Intimate Partner Violence: Not on file  Outpatient Medications Prior to Visit  Medication Sig Dispense Refill   amLODipine (NORVASC) 10 MG tablet Take 1 tablet (10 mg total) by mouth daily. 30 tablet 0   cloNIDine (CATAPRES) 0.1 MG tablet Take 1 tablet (0.1 mg total) by mouth 2 (two) times daily. 60 tablet 11   Dulaglutide (TRULICITY) 1.5 0000000 SOPN Inject 1.5 mg into the skin once a week. 6 mL 3   Insulin Lispro Prot & Lispro (HUMALOG MIX 75/25 KWIKPEN) (75-25) 100 UNIT/ML Kwikpen Inject 25 Units into the skin 2 (two) times daily. 45 mL 3   Insulin Pen Needle 32G X 4 MM MISC Use as instructed to inject insulin daily 100 each 5   Lancets (ONETOUCH DELICA PLUS [id number]) MISC Use as instructed to check blood sugar 2 times daily 100 each 6   ondansetron (ZOFRAN ODT) 4 MG  disintegrating tablet Take 1 tablet (4 mg total) by mouth every 8 (eight) hours as needed for nausea or vomiting. 20 tablet 0   ONETOUCH VERIO test strip Use as instructed to check blood sugar 2 times daily 100 each 5   Semaglutide,0.25 or 0.'5MG'$ /DOS, (OZEMPIC, 0.25 OR 0.5 MG/DOSE,) 2 MG/1.5ML SOPN Inject 0.5 mg into the skin once a week. (Patient taking differently: Inject 0.5 mg into the skin every Sunday.) 1 pen 6   triamcinolone cream (KENALOG) 0.1 % Apply 1 application topically 2 (two) times daily. 30 g 0   Lancets (ONETOUCH ULTRASOFT) lancets Use as instructed to test blood sugar 2 times daily E11.65 100 each 12   Vitamin D, Ergocalciferol, (DRISDOL) 1.25 MG (50000 UNIT) CAPS capsule Take 1 capsule (50,000 Units total) by mouth every 7 (seven) days. 4 capsule 3   No facility-administered medications prior to visit.    Allergies  Allergen Reactions   Crestor [Rosuvastatin Calcium] Anaphylaxis   Metformin And Related Diarrhea    Nausea and diarrhea   Atorvastatin Other (See Comments)    Neck stiffness   Codeine Itching    Review of Systems  Constitutional:  Positive for fever. Negative for chills.  HENT:  Positive for congestion (runny and clear) and sore throat.        (+) loss of taste   Respiratory:  Positive for cough (dry), sputum production and shortness of breath. Negative for wheezing.   Cardiovascular:  Negative for chest pain.  Gastrointestinal:  Positive for nausea. Negative for vomiting.  Musculoskeletal:  Positive for myalgias (body). Negative for neck pain.  Neurological:  Positive for weakness and headaches.      Objective:    Physical Exam Constitutional:      General: She is not in acute distress.    Appearance: Normal appearance. She is not ill-appearing or toxic-appearing.  HENT:     Head: Normocephalic and atraumatic.     Right Ear: External ear normal.     Left Ear: External ear normal.     Nose: Nose normal.  Eyes:     General:        Right eye:  No discharge.        Left eye: No discharge.  Pulmonary:     Effort: Pulmonary effort is normal.  Skin:    Findings: No rash.  Neurological:     Mental Status: She is alert and oriented to person, place, and time.  Psychiatric:        Behavior: Behavior normal.    There were no vitals taken for this visit. Wt Readings from Last 3 Encounters:  12/30/20 198 lb  4 oz (89.9 kg)  07/20/20 202 lb 9.6 oz (91.9 kg)  07/16/20 198 lb 12.8 oz (90.2 kg)    Diabetic Foot Exam - Simple   No data filed    Lab Results  Component Value Date   WBC 3.4 (L) 06/11/2020   HGB 12.7 06/11/2020   HCT 39.4 06/11/2020   PLT 267 06/11/2020   GLUCOSE 131 (H) 07/16/2020   CHOL 174 07/16/2020   TRIG 168 (H) 07/16/2020   HDL 58 07/16/2020   LDLDIRECT 115.0 08/02/2018   LDLCALC 89 07/16/2020   ALT 8 07/16/2020   AST 12 07/16/2020   NA 142 07/16/2020   K 3.1 (L) 07/16/2020   CL 101 07/16/2020   CREATININE 0.75 07/16/2020   BUN 6 (L) 07/16/2020   CO2 32 07/16/2020   TSH 1.63 06/11/2020   INR 1.0 04/15/2020   HGBA1C 8.1 (A) 12/30/2020   MICROALBUR <0.7 12/04/2019    Lab Results  Component Value Date   TSH 1.63 06/11/2020   Lab Results  Component Value Date   WBC 3.4 (L) 06/11/2020   HGB 12.7 06/11/2020   HCT 39.4 06/11/2020   MCV 83.7 06/11/2020   PLT 267 06/11/2020   Lab Results  Component Value Date   NA 142 07/16/2020   K 3.1 (L) 07/16/2020   CO2 32 07/16/2020   GLUCOSE 131 (H) 07/16/2020   BUN 6 (L) 07/16/2020   CREATININE 0.75 07/16/2020   BILITOT 0.4 07/16/2020   ALKPHOS 92 04/14/2020   AST 12 07/16/2020   ALT 8 07/16/2020   PROT 6.8 07/16/2020   ALBUMIN 4.2 04/14/2020   CALCIUM 8.8 07/16/2020   ANIONGAP 9 04/16/2020   GFR 66.72 04/14/2020   Lab Results  Component Value Date   CHOL 174 07/16/2020   Lab Results  Component Value Date   HDL 58 07/16/2020   Lab Results  Component Value Date   LDLCALC 89 07/16/2020   Lab Results  Component Value Date   TRIG  168 (H) 07/16/2020   Lab Results  Component Value Date   CHOLHDL 3.0 07/16/2020   Lab Results  Component Value Date   HGBA1C 8.1 (A) 12/30/2020       Assessment & Plan:   Problem List Items Addressed This Visit     Hyperglycemia    hgba1c acceptable, minimize simple carbs. Increase exercise as tolerate      COVID-19 virus infection    Started on Molnupirovir, Promethazine DM and may use Albuterol prn.  Pulse oximeter, want oxygen in 90s Multivitamin with minerals, selenium Vitamin D 1000-2000 IU daily Probiotic with lactobacillus and bifidophilus  Seek care if oxygen drops to 80s or if severe symptoms develop      Relevant Medications   molnupiravir EUA 200 mg CAPS    '@ENCMEDP'$ @  Meds ordered this encounter  Medications   molnupiravir EUA 200 mg CAPS    Sig: Take 4 capsules (800 mg total) by mouth 2 (two) times daily for 5 days.    Dispense:  40 capsule    Refill:  0   promethazine-dextromethorphan (PROMETHAZINE-DM) 6.25-15 MG/5ML syrup    Sig: Take 2.5-5 mLs by mouth 3 (three) times daily as needed for cough.    Dispense:  240 mL    Refill:  0   DISCONTD: albuterol (VENTOLIN HFA) 108 (90 Base) MCG/ACT inhaler    Sig: Inhale 2 puffs into the lungs every 6 (six) hours as needed for wheezing or shortness of breath.    Dispense:  18 g    Refill:  1    I discussed the assessment and treatment plan with the patient. The patient was provided an opportunity to ask questions and all were answered. The patient agreed with the plan and demonstrated an understanding of the instructions.   The patient was advised to call back or seek an in-person evaluation if the symptoms worsen or if the condition fails to improve as anticipated.  I provided 20 minutes of face-to-face time during this encounter.   I,Zite Okoli,acting as a Education administrator for Penni Homans, MD.,have documented all relevant documentation on the behalf of Penni Homans, MD,as directed by  Penni Homans, MD while in  the presence of Penni Homans, MD.   Penni Homans, MD [facility] at [facility] [phone] (phone) [phone] (fax)  [place]

## 2021-06-08 ENCOUNTER — Telehealth: Payer: Medicare PPO | Admitting: Family Medicine

## 2021-06-09 NOTE — Assessment & Plan Note (Signed)
hgba1c acceptable, minimize simple carbs. Increase exercise as tolerate

## 2021-06-09 NOTE — Assessment & Plan Note (Signed)
Started on Molnupirovir, Promethazine DM and may use Albuterol prn.  Pulse oximeter, want oxygen in 90s Multivitamin with minerals, selenium Vitamin D 1000-2000 IU daily Probiotic with lactobacillus and bifidophilus  Seek care if oxygen drops to 80s or if severe symptoms develop

## 2021-06-13 ENCOUNTER — Other Ambulatory Visit: Payer: Self-pay | Admitting: Family Medicine

## 2021-06-13 DIAGNOSIS — I10 Essential (primary) hypertension: Secondary | ICD-10-CM

## 2021-06-15 ENCOUNTER — Telehealth: Payer: Self-pay | Admitting: *Deleted

## 2021-06-15 NOTE — Telephone Encounter (Signed)
Received fax from pharmacy that they sent over last week that cough syrup was not covered.   Patient stated that she has picked up rx already.

## 2021-06-22 ENCOUNTER — Encounter: Payer: Medicare PPO | Admitting: Family Medicine

## 2021-06-28 ENCOUNTER — Telehealth: Payer: Medicare PPO

## 2021-07-01 ENCOUNTER — Ambulatory Visit (INDEPENDENT_AMBULATORY_CARE_PROVIDER_SITE_OTHER): Payer: Medicare PPO | Admitting: Pharmacist

## 2021-07-01 DIAGNOSIS — E1122 Type 2 diabetes mellitus with diabetic chronic kidney disease: Secondary | ICD-10-CM

## 2021-07-01 DIAGNOSIS — I10 Essential (primary) hypertension: Secondary | ICD-10-CM

## 2021-07-01 DIAGNOSIS — E785 Hyperlipidemia, unspecified: Secondary | ICD-10-CM

## 2021-07-01 DIAGNOSIS — IMO0001 Reserved for inherently not codable concepts without codable children: Secondary | ICD-10-CM

## 2021-07-01 NOTE — Chronic Care Management (AMB) (Signed)
Chronic Care Management Pharmacy Note  07/01/2021 Name:  Heather Walker MRN:  [id number] DOB:  Apr 13, 1961  Summary: Patient not checking blood glucose because glucometer is broken Due to have blood pressure med refills - blood pressure is improving  Recommendations/Changes made from today's visit: Provided new one touch verio glucometer - restart checking blood glucose daily Reminded to refill blood pressure meds - patient will pick up today.  Plan: CMA will call patient in 2 to 4 weeks to review home blood glucose readings Sees PCP in November Clinical pharmacist will check in with patient by phone in December 2022.   Subjective: Heather Walker is an 60 y.o. year old female who is a primary patient of [name], DO.  The CCM team was consulted for assistance with disease management and care coordination needs.    Engaged with patient by telephone for follow up visit in response to provider referral for pharmacy case management and/or care coordination services.   Consent to Services:  The patient was given information about Chronic Care Management services, agreed to services, and gave verbal consent prior to initiation of services.  Please see initial visit note for detailed documentation.   Patient Care Team: Carollee Herter, Alferd Apa, DO as PCP - General (Family Medicine) [name], PharmD (Pharmacist)  Recent office visits: 06/07/2021 - PCP (Dr Charlett Blake) Video Visit - COVID Positive. Symptoms started 8/18 and worsened on 8/20. Started Monupirovir 200mg  - take 400mg  bid for 5 days.  promethazine DM- take 2.5 to 5 mL 3 times a day as needed for cough.  and albuterol as needed. Recommended using pulse ox - keep O2 in the 90's. MVI with selenium, Vitamin D, probiotic wiht lactobacillus and bifidophilus.   Objective:  Lab Results  Component Value Date   CREATININE 0.75 07/16/2020   CREATININE 1.04 (H) 04/16/2020   CREATININE 1.20 (H) 04/15/2020    Lab Results   Component Value Date   HGBA1C 8.1 (A) 12/30/2020   Last diabetic Eye exam:  Lab Results  Component Value Date/Time   HMDIABEYEEXA No Retinopathy 04/15/2019 12:00 AM    Last diabetic Foot exam: No results found for: HMDIABFOOTEX      Component Value Date/Time   CHOL 174 07/16/2020 1320   TRIG 168 (H) 07/16/2020 1320   HDL 58 07/16/2020 1320   CHOLHDL 3.0 07/16/2020 1320   VLDL 34.4 02/20/2019 1053   LDLCALC 89 07/16/2020 1320   LDLDIRECT 115.0 08/02/2018 1525    Hepatic Function Latest Ref Rng & Units 07/16/2020 04/14/2020 04/08/2020  Total Protein 6.1 - 8.1 g/dL 6.8 8.4(H) 7.2  Albumin 3.5 - 5.2 g/dL - 4.2 3.5  AST 10 - 35 U/L 12 23 15   ALT 6 - 29 U/L 8 32 13  Alk Phosphatase 39 - 117 U/L - 92 78  Total Bilirubin 0.2 - 1.2 mg/dL 0.4 0.4 0.8  Bilirubin, Direct 0.0 - 0.2 mg/dL - - -    Lab Results  Component Value Date/Time   TSH 1.63 06/11/2020 03:02 PM   TSH 1.548 04/09/2020 12:11 PM   TSH 1.124 12/08/2014 03:29 PM    CBC Latest Ref Rng & Units 06/11/2020 04/16/2020 04/15/2020  WBC 3.8 - 10.8 Thousand/uL 3.4(L) 7.4 7.4  Hemoglobin 11.7 - 15.5 g/dL 12.7 13.4 14.1  Hematocrit 35.0 - 45.0 % 39.4 38.6 41.5  Platelets 140 - 400 Thousand/uL 267 341 345    Lab Results  Component Value Date/Time   VD25OH 12 (L) 06/11/2020 03:02  PM    Clinical ASCVD: No  The 10-year ASCVD risk score (Arnett DK, et al., 2019) is: 15.3%   Values used to calculate the score:     Age: 60 years     Sex: Female     Is Non-Hispanic African American: Yes     Diabetic: Yes     Tobacco smoker: No     Systolic Blood Pressure: 482 mmHg     Is BP treated: Yes     HDL Cholesterol: 58 mg/dL     Total Cholesterol: 174 mg/dL      Social History   Tobacco Use  Smoking Status Never  Smokeless Tobacco Never   BP Readings from Last 3 Encounters:  12/30/20 140/86  07/20/20 (!) 144/90  07/16/20 116/88   Pulse Readings from Last 3 Encounters:  12/30/20 77  07/20/20 80  07/16/20 90   Wt  Readings from Last 3 Encounters:  12/30/20 198 lb 4 oz (89.9 kg)  07/20/20 202 lb 9.6 oz (91.9 kg)  07/16/20 198 lb 12.8 oz (90.2 kg)    Assessment: Review of patient past medical history, allergies, medications, health status, including review of consultants reports, laboratory and other test data, was performed as part of comprehensive evaluation and provision of chronic care management services.   SDOH:  (Social Determinants of Health) assessments and interventions performed:  SDOH Interventions    Flowsheet Row Most Recent Value  SDOH Interventions   Financial Strain Interventions Intervention Not Indicated       CCM Care Plan  Allergies  Allergen Reactions   Crestor [Rosuvastatin Calcium] Anaphylaxis   Metformin And Related Diarrhea    Nausea and diarrhea   Atorvastatin Other (See Comments)    Neck stiffness   Codeine Itching    Medications Reviewed Today     Reviewed by [name], PharmD (Pharmacist) on 07/01/21 at 1412  Med List Status: <None>   Medication Order Taking? Sig Documenting Provider Last Dose Status Informant  albuterol (VENTOLIN HFA) 108 (90 Base) MCG/ACT inhaler [id number] Yes INHALE 2 PUFFS INTO THE LUNGS EVERY 6 HOURS AS NEEDED FOR WHEEZING OR SHORTNESS OF BREATH [name], DO Taking Active   amLODipine (NORVASC) 10 MG tablet [id number] Yes Take 1 tablet (10 mg total) by mouth daily. Carollee Herter, Kendrick Fries R, DO Taking Active   cloNIDine (CATAPRES) 0.1 MG tablet [id number] Yes TAKE 1 TABLET(0.1 MG) BY MOUTH TWICE DAILY Carollee Herter, Alferd Apa, DO Taking Active   Dulaglutide (TRULICITY) 1.5 JZ/7.9XT SOPN [id number] Yes Inject 1.5 mg into the skin once a week. Shamleffer, Melanie Crazier, MD Taking Active            Med Note Jonathon Jordan Jul 01, 2021  2:12 PM) Approved thru [place] PAP thru 10/16/2021  Insulin Lispro Prot & Lispro (HUMALOG MIX 75/25 KWIKPEN) (75-25) 100 UNIT/ML Claiborne Rigg [id number] Yes Inject 25 Units into the skin 2 (two)  times daily. Shamleffer, Melanie Crazier, MD Taking Active            Med Note Antony Contras, Adria Dill Jul 01, 2021  2:12 PM) Approved thru [place] PAP thru 10/16/2021  Insulin Pen Needle 32G X 4 MM MISC [id number] Yes Use as instructed to inject insulin daily Shamleffer, Melanie Crazier, MD Taking Active   Lancets (ONETOUCH DELICA PLUS [id number]) [place] [id number] Yes Use as instructed to check blood sugar 2 times daily Shamleffer, Melanie Crazier, MD Taking Active   Lancets ([place]) lancets [id number] Yes  Use as instructed to test blood sugar 2 times daily E11.65 Shamleffer, Melanie Crazier, MD Taking Active Other  ondansetron (ZOFRAN ODT) 4 MG disintegrating tablet [id number] Yes Take 1 tablet (4 mg total) by mouth every 8 (eight) hours as needed for nausea or vomiting. Roma Schanz R, DO Taking Active Other  ONETOUCH VERIO test strip [id number] Yes Use as instructed to check blood sugar 2 times daily Shamleffer, Melanie Crazier, MD Taking Active   triamcinolone cream (KENALOG) 0.1 % [id number] Yes Apply 1 application topically 2 (two) times daily. [name], DO Taking Active Other  Vitamin D, Ergocalciferol, (DRISDOL) 1.25 MG (50000 UNIT) CAPS capsule [id number] Yes Take 1 capsule (50,000 Units total) by mouth every 7 (seven) days. [name], DO Taking Active             Patient Active Problem List   Diagnosis Date Noted   Hyperlipidemia 07/16/2020   Dizziness 04/15/2020   BMI 35.0-35.9,adult 04/15/2020   Weakness 04/15/2020   Abnormal cortisol level 04/14/2020   SIRS (systemic inflammatory response syndrome) (HCC) 04/06/2020   Pneumonia due to COVID-19 virus 05/14/2019   COVID-19 virus infection 05/13/2019   Renal insufficiency 03/26/2019   At risk for cardiovascular event 03/26/2019   Allergic reaction due to correct medicinal substance properly administered 08/23/2018   Hypertriglyceridemia 08/05/2018   Asthma, chronic, unspecified asthma severity,  uncomplicated 31/51/7616   Chronic right shoulder pain 08/05/2018   Muscle spasm 08/05/2018   Insomnia 10/05/2017   Sepsis ([place]) 08/21/2017   Acute right hip pain 08/21/2017   Preventative health care 11/20/2016   Right ankle pain 05/09/2016   Palpitations 12/08/2014   DM (diabetes mellitus) type II uncontrolled, periph vascular disorder ([place]) 08/03/2014   Leg wound, right 08/03/2014   Hyperglycemia 07/13/2014   Chest pain 07/12/2014   Diabetes mellitus ([place]) 07/12/2014   Acute kidney injury ([place]) 07/12/2014   Abdominal pain, right upper quadrant 05/28/2014   Tachycardia 05/03/2013   Abscess 11/13/2012   Breast pain, right 08/10/2012   Osteoarthritis of left shoulder 03/04/2011   DEPRESSION, MAJOR, RECURRENT, SEVERE 12/22/2009   BACK PAIN, LUMBAR, WITH RADICULOPATHY 09/17/2009   CALF PAIN, RIGHT 09/17/2009   RENAL CYST 08/28/2009   LOW BACK PAIN, ACUTE 11/13/2008   FIBROIDS, UTERUS 09/10/2007   UTI 09/03/2007   BACTERIAL VAGINITIS 09/03/2007   Flank pain 09/03/2007   Hypokalemia 09/03/2007   MOLE 06/21/2007   ALLERGIC RHINITIS 06/21/2007   HEMOCCULT POSITIVE STOOL 06/21/2007   SINUSITIS, ACUTE NOS 04/05/2007   REFLUX, ESOPHAGEAL 04/05/2007   CHEST PAIN 04/05/2007   Essential hypertension 03/19/2007   ALLERGIC RHINITIS, SEASONAL 03/09/2007   Asthma 03/09/2007    Immunization History  Administered Date(s) Administered   Influenza,inj,Quad PF,6+ Mos 07/25/2014, 10/05/2015, 10/05/2017   PFIZER(Purple Top)SARS-COV-2 Vaccination 01/09/2020, 02/03/2020   Pneumococcal Polysaccharide-23 07/15/2014   Td 04/12/2006   Tdap 02/24/2011    Conditions to be addressed/monitored: HTN, HLD, and DMII  Care Plan : General Pharmacy (Adult)  Updates made by [name], PHARMD since 07/01/2021 12:00 AM     Problem: Chronic Care management and Medication management   Priority: High  Onset Date: 07/01/2021  Note:   Current Barriers:  Unable to independently afford treatment  regimen Unable to achieve control of type 2 diabetes  Unable to maintain control of hypertension Does not have glucometer to monitor blood glucose at home  Pharmacist Clinical Goal(s):  Over the next 90 days, patient will verbalize ability to afford treatment regimen achieve adherence  to monitoring guidelines and medication adherence to achieve therapeutic efficacy achieve control of type 2 DM as evidenced by A1c <7.0% maintain control of hypertension as evidenced by blood pressure consistently <140/90  Restart checking blood glucose with glucometer provided today  through collaboration with PharmD and provider.   Interventions: 1:1 collaboration with Carollee Herter, Alferd Apa, DO regarding development and update of comprehensive plan of care as evidenced by provider attestation and co-signature Inter-disciplinary care team collaboration (see longitudinal plan of care) Comprehensive medication review performed; medication list updated in electronic medical record  [place] (see longitudinal plan of care for additional care plan information)  Current Barriers:  Chronic Disease Management support, education, and care coordination needs related to Diabetes, Hypertension, Asthma, Depression   Hypertension BP Readings from Last 3 Encounters:  12/30/20 140/86  07/20/20 (!) 144/90  07/16/20 116/88  Improving but variable control;  BP goal <140/90 Current regimen:  Amlodipine 10 mg daily Clonidine 0.$RemoveBeforeDEI'1mg'JOfTZABcLOrNySvt$  twice daily Interventions: Reviewed refill history and discussed adherence with blood pressure medications Reminded to refill blood pressure medications Patient self care activities - Over the next 180 days, patient will: Check blood pressure 2 to 3 times per week, document, and provide at future appointments Remember to pick up refills for amlodipine and clonidine Ensure daily salt intake < 2300 mg/day  Hyperlipidemia Lab Results  Component Value Date/Time   LDLCALC 89  07/16/2020 01:20 PM   LDLDIRECT 115.0 08/02/2018 03:25 PM  At goal; LDL goal < 100 Current regimen:  Diet and exercise management   Statin intolerant: anaphylaxis to rosuvastatin and myalgias to atorvastatin Interventions: Discussed diet and exercise Patient self care activities - Over the next 180 days, patient will: Maintain LDL less than 100  Diabetes Lab Results  Component Value Date/Time   HGBA1C 8.1 (A) 12/30/2020 11:02 AM   HGBA1C 6.8 (A) 07/20/2020 09:59 AM   HGBA1C 11.5 (H) 02/20/2019 01:16 PM   HGBA1C 9.8 (H) 08/02/2018 03:25 PM  Not at goal A1c goal <7% Current regimen:  Humalog 75/25 - injection 25 units twice a day Trulicity 1.$RemoveBeforeD'5mg'earfiEMobdmHiF$  - inject 1.$RemoveBe'5mg'wsgxDSKRb$  once per week. Interventions: Discussed diet and exercise  Verified Humalog and Trulicity received through [facility] program Provided new One Touch Verio Reflect glucometer Reviewed home blood glucose readings and reviewed goals  Fasting blood glucose goal (before meals) = 80 to 130 Blood glucose goal after a meal = less than 180  Patient self care activities - Over the next 180 days, patient will: Check blood sugar once daily, document, and provide at future appointments Contact provider with any episodes of hypoglycemia Contact clinical pharmacist if you have any problems with new glucometer  Health Maintenance  Interventions: Discussed the following vaccines, but patient would like to decline Flu vaccine Covid vaccine booster Shingrix vaccine Patient self care activities - Over the next 180 days, patient will: Get your annual flu vaccine this season so you are protected. The vaccine is available at [facility], downstairs at the [place] or at your preferred local pharmacy.  Also recommend getting updated bivalent COVID-19 booster. If your last COVID-19 booster was at least 2 months ago, make sure to get the bivalent COVID-19 booster.    Medication management Current  pharmacy: [facility] Comprehensive medication review performed. Updated medication list (removed Ozemipic since patient is taking Trulicity now) Reviewed refill history Reminded that refill is due to clonidine and amlodipine - patient states has been filled and will pick up today Continue  current medication management strategy Patient self care activities - Over the next 180 days, patient will: Focus on medication adherence by filling and taking medications appropriately  Take medications as prescribed Report any questions or concerns to PharmD and/or provider(s)  Patient Goals/Self-Care Activities Over the next 90 days, patient will:  take medications as prescribed, check glucose daily, document, and provide at future appointments, and check blood pressure 2 to 3 times per week, document, and provide at future appointments  Follow Up Plan: Telephone follow up appointment with care management team member scheduled for:  3 months; CMA will check in with patient in 2 to 4 weeks to review home blood glucose readings         Medication Assistance:  Humalog 71/24 and Trulicity obtained through [place] PAP medication assistance program.  Enrollment ends 10/16/2021  Patient's preferred pharmacy is:  [id number] - [place], [place] [address] [place] [address] Phone: [phone] Fax: [phone]  [facility] [place], [place] - [address] [place] [address] Phone: [phone] Fax: [phone]    Follow Up:  Patient agrees to Care Plan and Follow-up.  Plan: Telephone follow up appointment with care management team member scheduled for:  3 months with clinical pharmacist  and The care management team will reach out to the patient again over the next 28 days.  [name], PharmD Clinical Pharmacist [facility]

## 2021-07-01 NOTE — Patient Instructions (Signed)
Heather Walker, It was a pleasure speaking with you today.  I have attached a summary of our visit today and information about your health goals.  If you have any questions or concerns, please feel free to contact me either at the phone number below or with a MyChart message.   Keep up the good work!  [name], PharmD Clinical Pharmacist [facility] [phone] (direct line)  [phone] (main office number)   PATIENT GOALS:  Goals Addressed             This Visit's Progress    Chronic Care Management Pharmacy Care Plan   On track    [place] (see longitudinal plan of care for additional care plan information)  Current Barriers:  Chronic Disease Management support, education, and care coordination needs related to Diabetes, Hypertension, Asthma, Depression   Hypertension BP Readings from Last 3 Encounters:  12/30/20 140/86  07/20/20 (!) 144/90  07/16/20 116/88  Pharmacist Clinical Goal(s): Over the next 180 days, patient will work with PharmD and providers to achieve BP goal <140/90 Current regimen:  Amlodipine 10 mg daily Clonidine 0.'1mg'$  twice daily Interventions: Reviewed refill history and discussed adherence with blood pressure medications Reminded to refill blood pressure medications Patient self care activities - Over the next 180 days, patient will: Check blood pressure 2 to 3 times per week, document, and provide at future appointments Remember to pick up refills for amlodipine and clonidine Ensure daily salt intake < 2300 mg/day  Hyperlipidemia Lab Results  Component Value Date/Time   LDLCALC 89 07/16/2020 01:20 PM   LDLDIRECT 115.0 08/02/2018 03:25 PM  Pharmacist Clinical Goal(s): Over the next 180 days, patient will work with PharmD and providers to maintain LDL goal < 100 Current regimen:  Diet and exercise management   Interventions: Discussed diet and exercise Patient self care activities - Over the  next 180 days, patient will: Maintain LDL less than 100  Diabetes Lab Results  Component Value Date/Time   HGBA1C 8.1 (A) 12/30/2020 11:02 AM   HGBA1C 6.8 (A) 07/20/2020 09:59 AM   HGBA1C 11.5 (H) 02/20/2019 01:16 PM   HGBA1C 9.8 (H) 08/02/2018 03:25 PM  Pharmacist Clinical Goal(s): Over the next 180 days, patient will work with PharmD and providers to maintain A1c goal <7% Current regimen:  Humalog 75/25 - injection 25 units twice a day Trulicity 1.'5mg'$  - inject 1.'5mg'$  once per week. Interventions: Discussed diet and exercise  Verified Humalog and Trulicity received through [facility] program Provided new One Touch Verio Reflect glucometer Patient self care activities - Over the next 180 days, patient will: Check blood sugar once daily, document, and provide at future appointments Contact provider with any episodes of hypoglycemia Contact clinical pharmacist if you have any problems with new glucometer  Health Maintenance  Pharmacist Clinical Goal(s) Over the next 90 days, patient will work with PharmD and providers to complete health maintenance screenings/vaccinations Interventions: Discussed the following vaccines, but patient would like to decline Flu vaccine Covid vaccine booster Shingrix vaccine Patient self care activities - Over the next 180 days, patient will: Get your annual flu vaccine this season so you are protected. The vaccine is available at [facility], downstairs at the [place] or at your preferred local pharmacy.  Also recommend getting updated bivalent COVID-19 booster. If your last COVID-19 booster was at least 2 months ago, make sure to get the bivalent COVID-19 booster.    Medication management Pharmacist  Clinical Goal(s): Over the next 180 days, patient will work with PharmD and providers to maintain optimal medication adherence [facility] pharmacy[facility] Comprehensive medication review  performed. Updated medication list (removed Ozemipic since patient is taking Trulicity now) Reviewed refill history Continue current medication management strategy Patient self care activities - Over the next 180 days, patient will: Focus on medication adherence by filling and taking medications appropriately  Take medications as prescribed Report any questions or concerns to PharmD and/or provider(s)  Please see past updates related to this goal by clicking on the "Past Updates" button in the selected goal         The patient verbalized understanding of instructions, educational materials, and care plan provided today and agreed to receive a mailed copy of patient instructions, educational materials, and care plan.   Telephone follow up appointment with care management team member scheduled for: 3 months  [name], PharmD Clinical Pharmacist [facility]

## 2021-07-13 ENCOUNTER — Other Ambulatory Visit: Payer: Self-pay

## 2021-07-13 ENCOUNTER — Ambulatory Visit (INDEPENDENT_AMBULATORY_CARE_PROVIDER_SITE_OTHER): Payer: Medicare PPO

## 2021-07-13 VITALS — BP 148/100 | HR 86 | Temp 97.8°F | Resp 12 | Ht 63.0 in | Wt 194.6 lb

## 2021-07-13 DIAGNOSIS — Z1231 Encounter for screening mammogram for malignant neoplasm of breast: Secondary | ICD-10-CM

## 2021-07-13 DIAGNOSIS — Z0000 Encounter for general adult medical examination without abnormal findings: Secondary | ICD-10-CM

## 2021-07-13 NOTE — Progress Notes (Signed)
Subjective:   Heather Walker is a 60 y.o. female who presents for Medicare Annual (Subsequent) preventive examination.  Review of Systems     Cardiac Risk Factors include: diabetes mellitus;hypertension;dyslipidemia;obesity (BMI >30kg/m2)     Objective:    Today's Vitals   07/13/21 1142 07/13/21 1206  BP: (!) 146/100 (!) 148/100  Pulse: 86   Resp: 12   Temp: 97.8 F (36.6 C)   TempSrc: Temporal   SpO2: 96%   Weight: 194 lb 9.6 oz (88.3 kg)   Height: 5\' 3"  (1.6 m)    Body mass index is 34.47 kg/m.  Advanced Directives 07/13/2021 04/15/2020 04/07/2020 04/06/2020 05/13/2019 08/24/2017 08/21/2017  Does Patient Have a Medical Advance Directive? No No No No No No No  Would patient like information on creating a medical advance directive? Yes (MAU/Ambulatory/Procedural Areas - Information given) No - Patient declined - No - Patient declined No - Patient declined No - Patient declined No - Patient declined  Pre-existing out of facility DNR order (yellow form or pink MOST form) - - - - - - -    Current Medications (verified) Outpatient Encounter Medications as of 07/13/2021  Medication Sig   albuterol (VENTOLIN HFA) 108 (90 Base) MCG/ACT inhaler INHALE 2 PUFFS INTO THE LUNGS EVERY 6 HOURS AS NEEDED FOR WHEEZING OR SHORTNESS OF BREATH   amLODipine (NORVASC) 10 MG tablet Take 1 tablet (10 mg total) by mouth daily.   cloNIDine (CATAPRES) 0.1 MG tablet TAKE 1 TABLET(0.1 MG) BY MOUTH TWICE DAILY   Dulaglutide (TRULICITY) 1.5 CW/2.3JS SOPN Inject 1.5 mg into the skin once a week.   Insulin Lispro Prot & Lispro (HUMALOG MIX 75/25 KWIKPEN) (75-25) 100 UNIT/ML Kwikpen Inject 25 Units into the skin 2 (two) times daily.   Insulin Pen Needle 32G X 4 MM MISC Use as instructed to inject insulin daily   Lancets (ONETOUCH DELICA PLUS EGBTDV76H) MISC Use as instructed to check blood sugar 2 times daily   Lancets (ONETOUCH ULTRASOFT) lancets Use as instructed to test blood sugar 2 times daily E11.65    ONETOUCH VERIO test strip Use as instructed to check blood sugar 2 times daily   triamcinolone cream (KENALOG) 0.1 % Apply 1 application topically 2 (two) times daily.   Vitamin D, Ergocalciferol, (DRISDOL) 1.25 MG (50000 UNIT) CAPS capsule Take 1 capsule (50,000 Units total) by mouth every 7 (seven) days.   ondansetron (ZOFRAN ODT) 4 MG disintegrating tablet Take 1 tablet (4 mg total) by mouth every 8 (eight) hours as needed for nausea or vomiting. (Patient not taking: Reported on 07/13/2021)   No facility-administered encounter medications on file as of 07/13/2021.    Allergies (verified) Crestor [rosuvastatin calcium], Metformin and related, Atorvastatin, and Codeine   History: Past Medical History:  Diagnosis Date   Arthritis    Asthma    Depression    Diabetes mellitus without complication ([place])    Heart murmur    Hypertension    Past Surgical History:  Procedure Laterality Date   ABDOMINAL HYSTERECTOMY     APPENDECTOMY     BLADDER SUSPENSION     CESAREAN SECTION     3 previous   TEE WITHOUT CARDIOVERSION N/A 08/24/2017   Procedure: TRANSESOPHAGEAL ECHOCARDIOGRAM (TEE);  Surgeon: Larey Dresser, MD;  Location: [facility] ENDOSCOPY;  Service: Cardiovascular;  Laterality: N/A;   TUBAL LIGATION     Family History  Problem Relation Age of Onset   Hypertension Mother    Asthma Mother    Hypertension  Sister    Asthma Sister    Diabetes Sister    Hypertension Maternal Grandmother    Heart defect Sister    Asthma Sister    Aneurysm Sister    Asthma Sister    Social History   Socioeconomic History   Marital status: Married    Spouse name: Not on file   Number of children: Not on file   Years of education: ged   Highest education level: Not on file  Occupational History   Occupation: disabled  Tobacco Use   Smoking status: Never   Smokeless tobacco: Never  Vaping Use   Vaping Use: Never used  Substance and Sexual Activity   Alcohol use: No   Drug use: No   Sexual  activity: Yes    Partners: Male    Birth control/protection: Surgical  Other Topics Concern   Not on file  Social History Narrative   Not on file   Social Determinants of Health   Financial Resource Strain: Low Risk    Difficulty of Paying Living Expenses: Not very hard  Food Insecurity: No Food Insecurity   Worried About Charity fundraiser in the Last Year: Never true   [place] in the Last Year: Never true  Transportation Needs: No Transportation Needs   Lack of Transportation (Medical): No   Lack of Transportation (Non-Medical): No  Physical Activity: Sufficiently Active   Days of Exercise per Week: 7 days   Minutes of Exercise per Session: 40 min  Stress: No Stress Concern Present   Feeling of Stress : Not at all  Social Connections: Moderately Integrated   Frequency of Communication with Friends and Family: More than three times a week   Frequency of Social Gatherings with Friends and Family: More than three times a week   Attends Religious Services: More than 4 times per year   Active Member of [employer] or Organizations: No   Attends Music therapist: Never   Marital Status: Married    Tobacco Counseling Counseling given: Not Answered   Clinical Intake:  Pre-visit preparation completed: Yes  Pain : No/denies pain     BMI - recorded: 34.47 Nutritional Status: BMI > 30  Obese Nutritional Risks: None Diabetes: Yes CBG done?: No Did pt. bring in CBG monitor from home?: No  How often do you need to have someone help you when you read instructions, pamphlets, or other written materials from your doctor or pharmacy?: 1 - Never  Diabetes:  Is the patient diabetic?  Yes  If diabetic, was a CBG obtained today?  No  Did the patient bring in their glucometer from home?  No  How often do you monitor your CBG's? daily.   Financial Strains and Diabetes Management:  Are you having any financial strains with the device, your supplies or your  medication? No .  Does the patient want to be seen by Chronic Care Management for management of their diabetes?  No  Would the patient like to be referred to a Nutritionist or for Diabetic Management?  No   Diabetic Exams:  Diabetic Eye Exam: Completed 6/15/202 per patient.   Diabetic Foot Exam: Pt has been advised about the importance in completing this exam. To be completed by PCP.    Interpreter Needed?: No  Information entered by :: Caroleen Hamman LPN   Activities of Daily Living In your present state of health, do you have any difficulty performing the following activities: 07/13/2021  Hearing? N  Vision? N  Difficulty concentrating or making decisions? N  Walking or climbing stairs? N  Dressing or bathing? N  Doing errands, shopping? N  Preparing Food and eating ? N  Using the Toilet? N  In the past six months, have you accidently leaked urine? N  Do you have problems with loss of bowel control? N  Managing your Medications? N  Managing your Finances? N  Housekeeping or managing your Housekeeping? N  Some recent data might be hidden    Patient Care Team: Carollee Herter, Alferd Apa, DO as PCP - General (Family Medicine) [name], PharmD (Pharmacist)  Indicate any recent Medical Services you may have received from other than [facility] providers in the past year (date may be approximate).     Assessment:   This is a routine wellness examination for [place].  Hearing/Vision screen Hearing Screening - Comments:: No issues Vision Screening - Comments:: Last eye exam-03/31/2021-Dr. McFarling  Dietary issues and exercise activities discussed: Current Exercise Habits: Home exercise routine, Type of exercise: walking, Time (Minutes): 45, Frequency (Times/Week): 7, Weekly Exercise (Minutes/Week): 315, Intensity: Mild, Exercise limited by: None identified   Goals Addressed             This Visit's Progress    Patient Stated       Continue walking & drinking plenty of water  & eat healthier       Depression Screen PHQ 2/9 Scores 07/13/2021 10/12/2020 08/06/2018 11/10/2016 11/10/2016  PHQ - 2 Score 0 0 0 0 0  PHQ- 9 Score - 0 0 - -    Fall Risk Fall Risk  07/13/2021 11/10/2016 11/10/2016  Falls in the past year? 0 No No  Number falls in past yr: 0 - -  Injury with Fall? 0 - -  Follow up Falls prevention discussed - -    FALL RISK PREVENTION PERTAINING TO THE HOME:  Any stairs in or around the home? No  Home free of loose throw rugs in walkways, pet beds, electrical cords, etc? Yes  Adequate lighting in your home to reduce risk of falls? Yes   ASSISTIVE DEVICES UTILIZED TO PREVENT FALLS:  Life alert? No  Use of a cane, walker or w/c? No  Grab bars in the bathroom? No  Shower chair or bench in shower? No  Elevated toilet seat or a handicapped toilet? No   TIMED UP AND GO:  Was the test performed? Yes .  Length of time to ambulate 10 feet: 11 sec.   Gait steady and fast without use of assistive device  Cognitive Function:Normal cognitive status assessed by direct observation by this Nurse Health Advisor. No abnormalities found.   MMSE - Mini Mental State Exam 11/10/2016  Orientation to time 5  Orientation to Place 5  Registration 3  Attention/ Calculation 5  Recall 3  Language- name 2 objects 2  Language- repeat 1  Language- follow 3 step command 3  Language- read & follow direction 1  Write a sentence 1  Copy design 1  Total score 30        Immunizations Immunization History  Administered Date(s) Administered   Influenza,inj,Quad PF,6+ Mos 07/25/2014, 10/05/2015, 10/05/2017   PFIZER(Purple Top)SARS-COV-2 Vaccination 01/09/2020, 02/03/2020   Pneumococcal Polysaccharide-23 07/15/2014   Td 04/12/2006   Tdap 02/24/2011    TDAP status: Due, Education has been provided regarding the importance of this vaccine. Advised may receive this vaccine at local pharmacy or Health Dept. Aware to provide a copy of the vaccination record  if  obtained from local pharmacy or Health Dept. Verbalized acceptance and understanding.  Flu Vaccine status: Declined, Education has been provided regarding the importance of this vaccine but patient still declined. Advised may receive this vaccine at local pharmacy or Health Dept. Aware to provide a copy of the vaccination record if obtained from local pharmacy or Health Dept. Verbalized acceptance and understanding.  Pneumococcal vaccine status: Up to date  Covid-19 vaccine status: Information provided on how to obtain vaccines.  Booster due  Qualifies for Shingles Vaccine? Yes   Zostavax completed No   Shingrix Completed?: No.    Education has been provided regarding the importance of this vaccine. Patient has been advised to call insurance company to determine out of pocket expense if they have not yet received this vaccine. Advised may also receive vaccine at local pharmacy or Health Dept. Verbalized acceptance and understanding.  Screening Tests Health Maintenance  Topic Date Due   Zoster Vaccines- Shingrix (1 of 2) Never done   COLONOSCOPY (Pts 45-7yrs Insurance coverage will need to be confirmed)  Never done   FOOT EXAM  08/03/2019   COVID-19 Vaccine (3 - Pfizer risk series) 03/02/2020   MAMMOGRAM  08/21/2020   TETANUS/TDAP  02/23/2021   INFLUENZA VACCINE  05/17/2021   OPHTHALMOLOGY EXAM  06/05/2021   HEMOGLOBIN A1C  07/02/2021   Hepatitis C Screening  Completed   HIV Screening  Completed   HPV VACCINES  Aged Out    Health Maintenance  Health Maintenance Due  Topic Date Due   Zoster Vaccines- Shingrix (1 of 2) Never done   COLONOSCOPY (Pts 45-73yrs Insurance coverage will need to be confirmed)  Never done   FOOT EXAM  08/03/2019   COVID-19 Vaccine (3 - Pfizer risk series) 03/02/2020   MAMMOGRAM  08/21/2020   TETANUS/TDAP  02/23/2021   INFLUENZA VACCINE  05/17/2021   OPHTHALMOLOGY EXAM  06/05/2021   HEMOGLOBIN A1C  07/02/2021    Colorectal cancer screening:  Due-Declined  Mammogram status: Ordered today. Pt provided with contact info and advised to call to schedule appt.   Bone Density status: Not yet indicated  Lung Cancer Screening: (Low Dose CT Chest recommended if Age 63-80 years, 30 pack-year currently smoking OR have quit w/in 15years.) does not qualify.     Additional Screening:  Hepatitis C Screening: Completed 10/05/2015  Vision Screening: Recommended annual ophthalmology exams for early detection of glaucoma and other disorders of the eye. Is the patient up to date with their annual eye exam?  Yes  Who is the provider or what is the name of the office in which the patient attends annual eye exams? Dr. Angelina Pih   Dental Screening: Recommended annual dental exams for proper oral hygiene  Community Resource Referral / Chronic Care Management: CRR required this visit?  No   CCM required this visit?  No      Plan:     I have personally reviewed and noted the following in the patient's chart:   Medical and social history Use of alcohol, tobacco or illicit drugs  Current medications and supplements including opioid prescriptions.  Functional ability and status Nutritional status Physical activity Advanced directives List of other physicians Hospitalizations, surgeries, and ER visits in previous 12 months Vitals Screenings to include cognitive, depression, and falls Referrals and appointments  In addition, I have reviewed and discussed with patient certain preventive protocols, quality metrics, and best practice recommendations. A written personalized care plan for preventive services as well as general preventive health  recommendations were provided to patient.     Marta Antu, LPN   7/00/5259  Nurse Health Advisor  Nurse Notes: None

## 2021-07-13 NOTE — Patient Instructions (Signed)
Heather Walker , Thank you for taking time to come for your Medicare Wellness Visit. I appreciate your ongoing commitment to your health goals. Please review the following plan we discussed and let me know if I can assist you in the future.   Screening recommendations/referrals: Colonoscopy: Due. Declined today. Please call the office to schedule if you change your mind. Mammogram: Ordered today. Someone will call you to schedule. Bone Density: Due at age 60. Recommended yearly ophthalmology/optometry visit for glaucoma screening and checkup Recommended yearly dental visit for hygiene and checkup  Vaccinations: Influenza vaccine: Declined Pneumococcal vaccine: Up to date Tdap vaccine: Discuss with pharmacy Shingles vaccine: Discuss with pharmacy.  Covid-19: Booster available at the pharmacy.  Advanced directives: Information given today.  Conditions/risks identified: See problem list  Next appointment: Follow up in one year for your annual wellness visit. 07/19/2021 @ 1:00.  Preventive Care 40-64 Years, Female Preventive care refers to lifestyle choices and visits with your health care provider that can promote health and wellness. What does preventive care include? A yearly physical exam. This is also called an annual well check. Dental exams once or twice a year. Routine eye exams. Ask your health care provider how often you should have your eyes checked. Personal lifestyle choices, including: Daily care of your teeth and gums. Regular physical activity. Eating a healthy diet. Avoiding tobacco and drug use. Limiting alcohol use. Practicing safe sex. Taking low-dose aspirin daily starting at age 74. Taking vitamin and mineral supplements as recommended by your health care provider. What happens during an annual well check? The services and screenings done by your health care provider during your annual well check will depend on your age, overall health, lifestyle risk factors, and  family history of disease. Counseling  Your health care provider may ask you questions about your: Alcohol use. Tobacco use. Drug use. Emotional well-being. Home and relationship well-being. Sexual activity. Eating habits. Work and work Statistician. Method of birth control. Menstrual cycle. Pregnancy history. Screening  You may have the following tests or measurements: Height, weight, and BMI. Blood pressure. Lipid and cholesterol levels. These may be checked every 5 years, or more frequently if you are over 60 years old. Skin check. Lung cancer screening. You may have this screening every year starting at age 41 if you have a 30-pack-year history of smoking and currently smoke or have quit within the past 15 years. Fecal occult blood test (FOBT) of the stool. You may have this test every year starting at age 86. Flexible sigmoidoscopy or colonoscopy. You may have a sigmoidoscopy every 5 years or a colonoscopy every 10 years starting at age 47. Hepatitis C blood test. Hepatitis B blood test. Sexually transmitted disease (STD) testing. Diabetes screening. This is done by checking your blood sugar (glucose) after you have not eaten for a while (fasting). You may have this done every 1-3 years. Mammogram. This may be done every 1-2 years. Talk to your health care provider about when you should start having regular mammograms. This may depend on whether you have a family history of breast cancer. BRCA-related cancer screening. This may be done if you have a family history of breast, ovarian, tubal, or peritoneal cancers. Pelvic exam and Pap test. This may be done every 3 years starting at age 66. Starting at age 42, this may be done every 5 years if you have a Pap test in combination with an HPV test. Bone density scan. This is done to screen for osteoporosis. You  may have this scan if you are at high risk for osteoporosis. Discuss your test results, treatment options, and if necessary,  the need for more tests with your health care provider. Vaccines  Your health care provider may recommend certain vaccines, such as: Influenza vaccine. This is recommended every year. Tetanus, diphtheria, and acellular pertussis (Tdap, Td) vaccine. You may need a Td booster every 10 years. Zoster vaccine. You may need this after age 63. Pneumococcal 13-valent conjugate (PCV13) vaccine. You may need this if you have certain conditions and were not previously vaccinated. Pneumococcal polysaccharide (PPSV23) vaccine. You may need one or two doses if you smoke cigarettes or if you have certain conditions. Talk to your health care provider about which screenings and vaccines you need and how often you need them. This information is not intended to replace advice given to you by your health care provider. Make sure you discuss any questions you have with your health care provider. Document Released: 10/30/2015 Document Revised: 06/22/2016 Document Reviewed: 08/04/2015 Elsevier Interactive Patient Education  2017 [place] Prevention in the Home Falls can cause injuries. They can happen to people of all ages. There are many things you can do to make your home safe and to help prevent falls. What can I do on the outside of my home? Regularly fix the edges of walkways and driveways and fix any cracks. Remove anything that might make you trip as you walk through a door, such as a raised step or threshold. Trim any bushes or trees on the path to your home. Use bright outdoor lighting. Clear any walking paths of anything that might make someone trip, such as rocks or tools. Regularly check to see if handrails are loose or broken. Make sure that both sides of any steps have handrails. Any raised decks and porches should have guardrails on the edges. Have any leaves, snow, or ice cleared regularly. Use sand or salt on walking paths during winter. Clean up any spills in your garage right  away. This includes oil or grease spills. What can I do in the bathroom? Use night lights. Install grab bars by the toilet and in the tub and shower. Do not use towel bars as grab bars. Use non-skid mats or decals in the tub or shower. If you need to sit down in the shower, use a plastic, non-slip stool. Keep the floor dry. Clean up any water that spills on the floor as soon as it happens. Remove soap buildup in the tub or shower regularly. Attach bath mats securely with double-sided non-slip rug tape. Do not have throw rugs and other things on the floor that can make you trip. What can I do in the bedroom? Use night lights. Make sure that you have a light by your bed that is easy to reach. Do not use any sheets or blankets that are too big for your bed. They should not hang down onto the floor. Have a firm chair that has side arms. You can use this for support while you get dressed. Do not have throw rugs and other things on the floor that can make you trip. What can I do in the kitchen? Clean up any spills right away. Avoid walking on wet floors. Keep items that you use a lot in easy-to-reach places. If you need to reach something above you, use a strong step stool that has a grab bar. Keep electrical cords out of the way. Do not use floor  polish or wax that makes floors slippery. If you must use wax, use non-skid floor wax. Do not have throw rugs and other things on the floor that can make you trip. What can I do with my stairs? Do not leave any items on the stairs. Make sure that there are handrails on both sides of the stairs and use them. Fix handrails that are broken or loose. Make sure that handrails are as long as the stairways. Check any carpeting to make sure that it is firmly attached to the stairs. Fix any carpet that is loose or worn. Avoid having throw rugs at the top or bottom of the stairs. If you do have throw rugs, attach them to the floor with carpet tape. Make sure  that you have a light switch at the top of the stairs and the bottom of the stairs. If you do not have them, ask someone to add them for you. What else can I do to help prevent falls? Wear shoes that: Do not have high heels. Have rubber bottoms. Are comfortable and fit you well. Are closed at the toe. Do not wear sandals. If you use a stepladder: Make sure that it is fully opened. Do not climb a closed stepladder. Make sure that both sides of the stepladder are locked into place. Ask someone to hold it for you, if possible. Clearly mark and make sure that you can see: Any grab bars or handrails. First and last steps. Where the edge of each step is. Use tools that help you move around (mobility aids) if they are needed. These include: Canes. Walkers. Scooters. Crutches. Turn on the lights when you go into a dark area. Replace any light bulbs as soon as they burn out. Set up your furniture so you have a clear path. Avoid moving your furniture around. If any of your floors are uneven, fix them. If there are any pets around you, be aware of where they are. Review your medicines with your doctor. Some medicines can make you feel dizzy. This can increase your chance of falling. Ask your doctor what other things that you can do to help prevent falls. This information is not intended to replace advice given to you by your health care provider. Make sure you discuss any questions you have with your health care provider. Document Released: 07/30/2009 Document Revised: 03/10/2016 Document Reviewed: 11/07/2014 Elsevier Interactive Patient Education  2017 [employer].

## 2021-07-16 DIAGNOSIS — E1122 Type 2 diabetes mellitus with diabetic chronic kidney disease: Secondary | ICD-10-CM

## 2021-07-16 DIAGNOSIS — I10 Essential (primary) hypertension: Secondary | ICD-10-CM

## 2021-07-16 DIAGNOSIS — I129 Hypertensive chronic kidney disease with stage 1 through stage 4 chronic kidney disease, or unspecified chronic kidney disease: Secondary | ICD-10-CM

## 2021-07-16 DIAGNOSIS — E785 Hyperlipidemia, unspecified: Secondary | ICD-10-CM | POA: Diagnosis not present

## 2021-07-17 DIAGNOSIS — Z0100 Encounter for examination of eyes and vision without abnormal findings: Secondary | ICD-10-CM | POA: Diagnosis not present

## 2021-07-29 ENCOUNTER — Telehealth: Payer: Self-pay | Admitting: Pharmacist

## 2021-07-29 NOTE — Chronic Care Management (AMB) (Signed)
    Chronic Care Management Pharmacy Assistant   Name: Heather Walker  MRN: [id number] DOB: 08-07-1961  Reason for Encounter: Disease State Diabetes Mellitus  Recent office visits:  None noted  Recent consult visits:  None noted  Hospital visits:  None in previous 6 months  Medications: Outpatient Encounter Medications as of 07/29/2021  Medication Sig Note   albuterol (VENTOLIN HFA) 108 (90 Base) MCG/ACT inhaler INHALE 2 PUFFS INTO THE LUNGS EVERY 6 HOURS AS NEEDED FOR WHEEZING OR SHORTNESS OF BREATH    amLODipine (NORVASC) 10 MG tablet Take 1 tablet (10 mg total) by mouth daily.    cloNIDine (CATAPRES) 0.1 MG tablet TAKE 1 TABLET(0.1 MG) BY MOUTH TWICE DAILY    Dulaglutide (TRULICITY) 1.5 UK/0.2RK SOPN Inject 1.5 mg into the skin once a week. 07/01/2021: Approved thru [place] [facility] thru 10/16/2021   Insulin Lispro Prot & Lispro (HUMALOG MIX 75/25 KWIKPEN) (75-25) 100 UNIT/ML Kwikpen Inject 25 Units into the skin 2 (two) times daily. 07/01/2021: Approved thru [place] [facility] thru 10/16/2021   Insulin Pen Needle 32G X 4 MM MISC Use as instructed to inject insulin daily    Lancets (ONETOUCH DELICA PLUS [id number]) MISC Use as instructed to check blood sugar 2 times daily    Lancets (ONETOUCH ULTRASOFT) lancets Use as instructed to test blood sugar 2 times daily E11.65    ondansetron (ZOFRAN ODT) 4 MG disintegrating tablet Take 1 tablet (4 mg total) by mouth every 8 (eight) hours as needed for nausea or vomiting. (Patient not taking: Reported on 07/13/2021)    ONETOUCH VERIO test strip Use as instructed to check blood sugar 2 times daily    triamcinolone cream (KENALOG) 0.1 % Apply 1 application topically 2 (two) times daily.    Vitamin D, Ergocalciferol, (DRISDOL) 1.25 MG (50000 UNIT) CAPS capsule Take 1 capsule (50,000 Units total) by mouth every 7 (seven) days.    No facility-administered encounter medications on file as of 07/29/2021.   Current antihyperglycemic regimen:  Humalog 75/25 -  injection 25 units twice a day Trulicity 1.5mg  - inject 1.5mg  once per week.  Unsuccessful out reach to complete assessment call. I have attempted to call three times and left three voicemail's to return phone call.   Adherence Review: Is the patient currently on a STATIN medication? No Is the patient currently on ACE/ARB medication? No Does the patient have >5 day gap between last estimated fill dates? No   Star Rating Drugs: Trulicity 1.5 mg Last filled:None noted  Corrie Mckusick, [place]

## 2021-08-20 ENCOUNTER — Ambulatory Visit (INDEPENDENT_AMBULATORY_CARE_PROVIDER_SITE_OTHER): Payer: Medicare HMO | Admitting: Family Medicine

## 2021-08-20 ENCOUNTER — Other Ambulatory Visit: Payer: Self-pay

## 2021-08-20 ENCOUNTER — Encounter: Payer: Self-pay | Admitting: Family Medicine

## 2021-08-20 VITALS — BP 130/90 | HR 87 | Temp 98.4°F | Resp 18 | Ht 63.0 in | Wt 184.6 lb

## 2021-08-20 DIAGNOSIS — Z0000 Encounter for general adult medical examination without abnormal findings: Secondary | ICD-10-CM | POA: Diagnosis not present

## 2021-08-20 DIAGNOSIS — E785 Hyperlipidemia, unspecified: Secondary | ICD-10-CM

## 2021-08-20 DIAGNOSIS — E559 Vitamin D deficiency, unspecified: Secondary | ICD-10-CM

## 2021-08-20 DIAGNOSIS — J4520 Mild intermittent asthma, uncomplicated: Secondary | ICD-10-CM | POA: Diagnosis not present

## 2021-08-20 DIAGNOSIS — Z1211 Encounter for screening for malignant neoplasm of colon: Secondary | ICD-10-CM | POA: Diagnosis not present

## 2021-08-20 DIAGNOSIS — E1169 Type 2 diabetes mellitus with other specified complication: Secondary | ICD-10-CM

## 2021-08-20 DIAGNOSIS — I10 Essential (primary) hypertension: Secondary | ICD-10-CM | POA: Diagnosis not present

## 2021-08-20 DIAGNOSIS — E1151 Type 2 diabetes mellitus with diabetic peripheral angiopathy without gangrene: Secondary | ICD-10-CM

## 2021-08-20 DIAGNOSIS — E1165 Type 2 diabetes mellitus with hyperglycemia: Secondary | ICD-10-CM | POA: Diagnosis not present

## 2021-08-20 DIAGNOSIS — Z1231 Encounter for screening mammogram for malignant neoplasm of breast: Secondary | ICD-10-CM

## 2021-08-20 DIAGNOSIS — E2839 Other primary ovarian failure: Secondary | ICD-10-CM

## 2021-08-20 LAB — COMPREHENSIVE METABOLIC PANEL
ALT: 14 U/L (ref 0–35)
AST: 17 U/L (ref 0–37)
Albumin: 4.2 g/dL (ref 3.5–5.2)
Alkaline Phosphatase: 110 U/L (ref 39–117)
BUN: 11 mg/dL (ref 6–23)
CO2: 35 mEq/L — ABNORMAL HIGH (ref 19–32)
Calcium: 9.5 mg/dL (ref 8.4–10.5)
Chloride: 101 mEq/L (ref 96–112)
Creatinine, Ser: 1.02 mg/dL (ref 0.40–1.20)
GFR: 60.51 mL/min (ref >60.00)
Glucose, Bld: 116 mg/dL — ABNORMAL HIGH (ref 70–99)
Potassium: 3.3 mEq/L — ABNORMAL LOW (ref 3.5–5.1)
Sodium: 143 mEq/L (ref 135–145)
Total Bilirubin: 0.5 mg/dL (ref 0.2–1.2)
Total Protein: 7.5 g/dL (ref 6.0–8.3)

## 2021-08-20 LAB — CBC WITH DIFFERENTIAL/PLATELET
Basophils Absolute: 0 10*3/uL (ref 0.0–0.1)
Basophils Relative: 0.2 % (ref 0.0–3.0)
Eosinophils Absolute: 0.1 10*3/uL (ref 0.0–0.7)
Eosinophils Relative: 2.9 % (ref 0.0–5.0)
HCT: 40.8 % (ref 36.0–46.0)
Hemoglobin: 13.5 g/dL (ref 12.0–15.0)
Lymphocytes Relative: 47.6 % — ABNORMAL HIGH (ref 12.0–46.0)
Lymphs Abs: 1.5 10*3/uL (ref 0.7–4.0)
MCHC: 33.1 g/dL (ref 30.0–36.0)
MCV: 83.3 fl (ref 78.0–100.0)
Monocytes Absolute: 0.4 10*3/uL (ref 0.1–1.0)
Monocytes Relative: 13.7 % — ABNORMAL HIGH (ref 3.0–12.0)
Neutro Abs: 1.1 10*3/uL — ABNORMAL LOW (ref 1.4–7.7)
Neutrophils Relative %: 35.6 % — ABNORMAL LOW (ref 43.0–77.0)
Platelets: 235 10*3/uL (ref 150.0–400.0)
RBC: 4.9 Mil/uL (ref 3.87–5.11)
RDW: 15.6 % — ABNORMAL HIGH (ref 11.5–15.5)
WBC: 3.1 10*3/uL — ABNORMAL LOW (ref 4.0–10.5)

## 2021-08-20 LAB — LIPID PANEL
Cholesterol: 189 mg/dL (ref 0–200)
HDL: 51.1 mg/dL (ref >39.00)
LDL Cholesterol: 107 mg/dL — ABNORMAL HIGH (ref 0–99)
NonHDL: 138.14
Total CHOL/HDL Ratio: 4
Triglycerides: 157 mg/dL — ABNORMAL HIGH (ref 0.0–149.0)
VLDL: 31.4 mg/dL (ref 0.0–40.0)

## 2021-08-20 LAB — MICROALBUMIN / CREATININE URINE RATIO
Creatinine,U: 183.8 mg/dL
Microalb Creat Ratio: 2.3 mg/g (ref 0.0–30.0)
Microalb, Ur: 4.2 mg/dL — ABNORMAL HIGH (ref 0.0–1.9)

## 2021-08-20 LAB — HEMOGLOBIN A1C: Hgb A1c MFr Bld: 7.1 % — ABNORMAL HIGH (ref 4.6–6.5)

## 2021-08-20 MED ORDER — VITAMIN D (ERGOCALCIFEROL) 1.25 MG (50000 UNIT) PO CAPS: 50000.0000 [IU] | ORAL_CAPSULE | ORAL | 1 refills | Status: DC

## 2021-08-20 MED ORDER — CLONIDINE HCL 0.1 MG PO TABS: ORAL_TABLET | ORAL | 1 refills | Status: DC

## 2021-08-20 MED ORDER — ALBUTEROL SULFATE HFA 108 (90 BASE) MCG/ACT IN AERS: 2.0000 | INHALATION_SPRAY | Freq: Four times a day (QID) | RESPIRATORY_TRACT | 3 refills | Status: DC | PRN

## 2021-08-20 MED ORDER — AMLODIPINE BESYLATE 10 MG PO TABS: 10.0000 mg | ORAL_TABLET | Freq: Every day | ORAL | 1 refills | Status: DC

## 2021-08-20 NOTE — Assessment & Plan Note (Signed)
ghm utd Check labs  

## 2021-08-20 NOTE — Assessment & Plan Note (Signed)
Well controlled, no changes to meds. Encouraged heart healthy diet such as the DASH diet and exercise as tolerated.  °

## 2021-08-20 NOTE — Assessment & Plan Note (Signed)
hgba1c to be checked, minimize simple carbs. Increase exercise as tolerated. Continue current meds  

## 2021-08-20 NOTE — Patient Instructions (Signed)
Preventive Care 92-58 Years Old, Female Preventive care refers to lifestyle choices and visits with your health care provider that can promote health and wellness. Preventive care visits are also called wellness exams. What can I expect for my preventive care visit? Counseling Your health care provider may ask you questions about your: Medical history, including: Past medical problems. Family medical history. Pregnancy history. Current health, including: Menstrual cycle. Method of birth control. Emotional well-being. Home life and relationship well-being. Sexual activity and sexual health. Lifestyle, including: Alcohol, nicotine or tobacco, and drug use. Access to firearms. Diet, exercise, and sleep habits. Work and work Statistician. Sunscreen use. Safety issues such as seatbelt and bike helmet use. Physical exam Your health care provider will check your: Height and weight. These may be used to calculate your BMI (body mass index). BMI is a measurement that tells if you are at a healthy weight. Waist circumference. This measures the distance around your waistline. This measurement also tells if you are at a healthy weight and may help predict your risk of certain diseases, such as type 2 diabetes and high blood pressure. Heart rate and blood pressure. Body temperature. Skin for abnormal spots. What immunizations do I need? Vaccines are usually given at various ages, according to a schedule. Your health care provider will recommend vaccines for you based on your age, medical history, and lifestyle or other factors, such as travel or where you work. What tests do I need? Screening Your health care provider may recommend screening tests for certain conditions. This may include: Lipid and cholesterol levels. Diabetes screening. This is done by checking your blood sugar (glucose) after you have not eaten for a while (fasting). Pelvic exam and Pap test. Hepatitis B test. Hepatitis C  test. HIV (human immunodeficiency virus) test. STI (sexually transmitted infection) testing, if you are at risk. Lung cancer screening. Colorectal cancer screening. Mammogram. Talk with your health care provider about when you should start having regular mammograms. This may depend on whether you have a family history of breast cancer. BRCA-related cancer screening. This may be done if you have a family history of breast, ovarian, tubal, or peritoneal cancers. Bone density scan. This is done to screen for osteoporosis. Talk with your health care provider about your test results, treatment options, and if necessary, the need for more tests. Follow these instructions at home: Eating and drinking  Eat a diet that includes fresh fruits and vegetables, whole grains, lean protein, and low-fat dairy products. Take vitamin and mineral supplements as recommended by your health care provider. Do not drink alcohol if: Your health care provider tells you not to drink. You are pregnant, may be pregnant, or are planning to become pregnant. If you drink alcohol: Limit how much you have to 0-1 drink a day. Know how much alcohol is in your drink. In the [place] one drink equals one 12 oz bottle of beer (355 mL), one 5 oz glass of wine (148 mL), or one 1 oz glass of hard liquor (44 mL). Lifestyle Brush your teeth every morning and night with fluoride toothpaste. Floss one time each day. Exercise for at least 30 minutes 5 or more days each week. Do not use any products that contain nicotine or tobacco. These products include cigarettes, chewing tobacco, and vaping devices, such as e-cigarettes. If you need help quitting, ask your health care provider. Do not use drugs. If you are sexually active, practice safe sex. Use a condom or other form of protection to prevent  STIs. If you do not wish to become pregnant, use a form of birth control. If you plan to become pregnant, see your health care provider for a  prepregnancy visit. Take aspirin only as told by your health care provider. Make sure that you understand how much to take and what form to take. Work with your health care provider to find out whether it is safe and beneficial for you to take aspirin daily. Find healthy ways to manage stress, such as: Meditation, yoga, or listening to music. Journaling. Talking to a trusted person. Spending time with friends and family. Minimize exposure to UV radiation to reduce your risk of skin cancer. Safety Always wear your seat belt while driving or riding in a vehicle. Do not drive: If you have been drinking alcohol. Do not ride with someone who has been drinking. When you are tired or distracted. While texting. If you have been using any mind-altering substances or drugs. Wear a helmet and other protective equipment during sports activities. If you have firearms in your house, make sure you follow all gun safety procedures. Seek help if you have been physically or sexually abused. What's next? Visit your health care provider once a year for an annual wellness visit. Ask your health care provider how often you should have your eyes and teeth checked. Stay up to date on all vaccines. This information is not intended to replace advice given to you by your health care provider. Make sure you discuss any questions you have with your health care provider. Document Revised: 03/31/2021 Document Reviewed: 03/31/2021 Elsevier Patient Education  [place].

## 2021-08-20 NOTE — Assessment & Plan Note (Signed)
Encourage heart healthy diet such as MIND or DASH diet, increase exercise, avoid trans fats, simple carbohydrates and processed foods, consider a krill or fish or flaxseed oil cap daily.  °

## 2021-08-20 NOTE — Progress Notes (Signed)
Subjective:   By signing my name below, I, Zite Okoli, attest that this documentation has been prepared under the direction and in the presence of  Roma Schanz R DO. 08/20/2021      Patient ID: Heather Walker, female    DOB: Jun 06, 1961, 60 y.o.   MRN: [id number]  Chief Complaint  Patient presents with   Annual Exam    Pt states fasting     HPI Patient is in today for a comprehensive physical exam.  She reports that her depression has resolved and is doing well.  She reports she has lost some weight and has been exercising by walking 6 times a week for 45 minutes. Wt Readings from Last 3 Encounters:  08/20/21 184 lb 9.6 oz (83.7 kg)  07/13/21 194 lb 9.6 oz (88.3 kg)  12/30/20 198 lb 4 oz (89.9 kg)    She is requesting for a refill on 0.1 mg clonidine, albuterol, 1.25 mg Vitamin D and 10 mg amlodipine.  She checks her sugar levels at home and mentions they have been low and good.  She denies fever, hearing loss, ear pain,congestion, sinus pain, sore throat, eye pain, chest pain, palpitations, cough, shortness of breath, wheezing, nausea. vomiting, diarrhea, constipation, blood in stool, dysuria,frequency, hematuria and headaches.   She is UTD on vision and dental care.  There has been no changes in the family medical history. There has been no recent surgeries.  She is not interested in the shingles, flu and pneumonia vaccines. She has 2 Pfizer Covid-19 vaccines at This time.  Past Medical History:  Diagnosis Date   Arthritis    Asthma    Depression    Diabetes mellitus without complication ([place])    Heart murmur    Hypertension     Past Surgical History:  Procedure Laterality Date   ABDOMINAL HYSTERECTOMY     APPENDECTOMY     BLADDER SUSPENSION     CESAREAN SECTION     3 previous   TEE WITHOUT CARDIOVERSION N/A 08/24/2017   Procedure: TRANSESOPHAGEAL ECHOCARDIOGRAM (TEE);  Surgeon: Larey Dresser, MD;  Location: [facility] ENDOSCOPY;  Service: Cardiovascular;   Laterality: N/A;   TUBAL LIGATION      Family History  Problem Relation Age of Onset   Hypertension Mother    Asthma Mother    Hypertension Sister    Asthma Sister    Diabetes Sister    Hypertension Maternal Grandmother    Heart defect Sister    Asthma Sister    Aneurysm Sister    Asthma Sister     Social History   Socioeconomic History   Marital status: Married    Spouse name: Not on file   Number of children: Not on file   Years of education: ged   Highest education level: Not on file  Occupational History   Occupation: disabled  Tobacco Use   Smoking status: Never   Smokeless tobacco: Never  Vaping Use   Vaping Use: Never used  Substance and Sexual Activity   Alcohol use: No   Drug use: No   Sexual activity: Yes    Partners: Male    Birth control/protection: Surgical  Other Topics Concern   Not on file  Social History Narrative   Not on file   Social Determinants of Health   Financial Resource Strain: Low Risk    Difficulty of Paying Living Expenses: Not very hard  Food Insecurity: No Food Insecurity   Worried About Running Out of  Food in the Last Year: Never true   [place] in the Last Year: Never true  Transportation Needs: No Transportation Needs   Lack of Transportation (Medical): No   Lack of Transportation (Non-Medical): No  Physical Activity: Sufficiently Active   Days of Exercise per Week: 7 days   Minutes of Exercise per Session: 40 min  Stress: No Stress Concern Present   Feeling of Stress : Not at all  Social Connections: Moderately Integrated   Frequency of Communication with Friends and Family: More than three times a week   Frequency of Social Gatherings with Friends and Family: More than three times a week   Attends Religious Services: More than 4 times per year   Active Member of [employer] or Organizations: No   Attends Archivist Meetings: Never   Marital Status: Married  Human resources officer Violence: Not At Risk   Fear  of Current or Ex-Partner: No   Emotionally Abused: No   Physically Abused: No   Sexually Abused: No    Outpatient Medications Prior to Visit  Medication Sig Dispense Refill   Dulaglutide (TRULICITY) 1.5 KC/1.2XN SOPN Inject 1.5 mg into the skin once a week. 6 mL 3   Insulin Lispro Prot & Lispro (HUMALOG MIX 75/25 KWIKPEN) (75-25) 100 UNIT/ML Kwikpen Inject 25 Units into the skin 2 (two) times daily. 45 mL 3   Insulin Pen Needle 32G X 4 MM MISC Use as instructed to inject insulin daily 100 each 5   Lancets (ONETOUCH DELICA PLUS [id number]) MISC Use as instructed to check blood sugar 2 times daily 100 each 6   Lancets (ONETOUCH ULTRASOFT) lancets Use as instructed to test blood sugar 2 times daily E11.65 100 each 12   ONETOUCH VERIO test strip Use as instructed to check blood sugar 2 times daily 100 each 5   triamcinolone cream (KENALOG) 0.1 % Apply 1 application topically 2 (two) times daily. 30 g 0   albuterol (VENTOLIN HFA) 108 (90 Base) MCG/ACT inhaler INHALE 2 PUFFS INTO THE LUNGS EVERY 6 HOURS AS NEEDED FOR WHEEZING OR SHORTNESS OF BREATH 54 g 1   amLODipine (NORVASC) 10 MG tablet Take 1 tablet (10 mg total) by mouth daily. 30 tablet 0   cloNIDine (CATAPRES) 0.1 MG tablet TAKE 1 TABLET(0.1 MG) BY MOUTH TWICE DAILY 60 tablet 11   Vitamin D, Ergocalciferol, (DRISDOL) 1.25 MG (50000 UNIT) CAPS capsule Take 1 capsule (50,000 Units total) by mouth every 7 (seven) days. 4 capsule 3   ondansetron (ZOFRAN ODT) 4 MG disintegrating tablet Take 1 tablet (4 mg total) by mouth every 8 (eight) hours as needed for nausea or vomiting. (Patient not taking: No sig reported) 20 tablet 0   No facility-administered medications prior to visit.    Allergies  Allergen Reactions   Crestor [Rosuvastatin Calcium] Anaphylaxis   Metformin And Related Diarrhea    Nausea and diarrhea   Atorvastatin Other (See Comments)    Neck stiffness   Codeine Itching    Review of Systems  Constitutional:  Negative for  fever.  HENT:  Negative for congestion, ear pain, hearing loss, sinus pain and sore throat.   Eyes:  Negative for blurred vision and pain.  Respiratory:  Negative for cough, sputum production, shortness of breath and wheezing.   Cardiovascular:  Negative for chest pain and palpitations.  Gastrointestinal:  Negative for blood in stool, constipation, diarrhea, nausea and vomiting.  Genitourinary:  Negative for dysuria, frequency, hematuria and urgency.  Musculoskeletal:  Negative for back pain, falls and myalgias.  Neurological:  Negative for dizziness, sensory change, loss of consciousness, weakness and headaches.  Endo/Heme/Allergies:  Negative for environmental allergies. Does not bruise/bleed easily.  Psychiatric/Behavioral:  Negative for depression and suicidal ideas. The patient is not nervous/anxious and does not have insomnia.       Objective:    Physical Exam Constitutional:      General: She is not in acute distress.    Appearance: Normal appearance. She is not ill-appearing.  HENT:     Head: Normocephalic and atraumatic.     Right Ear: Tympanic membrane, ear canal and external ear normal.     Left Ear: Tympanic membrane, ear canal and external ear normal.  Eyes:     Extraocular Movements: Extraocular movements intact.     Pupils: Pupils are equal, round, and reactive to light.  Cardiovascular:     Rate and Rhythm: Normal rate and regular rhythm.     Pulses: Normal pulses.     Heart sounds: Normal heart sounds. No murmur heard.   No gallop.  Pulmonary:     Effort: Pulmonary effort is normal. No respiratory distress.     Breath sounds: Normal breath sounds. No wheezing, rhonchi or rales.  Abdominal:     General: Bowel sounds are normal. There is no distension.     Palpations: Abdomen is soft. There is no mass.     Tenderness: There is no abdominal tenderness. There is no guarding or rebound.     Hernia: No hernia is present.  Musculoskeletal:     Cervical back: Normal  range of motion and neck supple.  Lymphadenopathy:     Cervical: No cervical adenopathy.  Skin:    General: Skin is warm and dry.  Neurological:     Mental Status: She is alert and oriented to person, place, and time.  Psychiatric:        Behavior: Behavior normal.    BP 130/90 (BP Location: Left Arm, Patient Position: Sitting, Cuff Size: Large)   Pulse 87   Temp 98.4 F (36.9 C) (Oral)   Resp 18   Ht 5\' 3"  (1.6 m)   Wt 184 lb 9.6 oz (83.7 kg)   SpO2 98%   BMI 32.70 kg/m  Wt Readings from Last 3 Encounters:  08/20/21 184 lb 9.6 oz (83.7 kg)  07/13/21 194 lb 9.6 oz (88.3 kg)  12/30/20 198 lb 4 oz (89.9 kg)    Diabetic Foot Exam - Simple   No data filed    Lab Results  Component Value Date   WBC 3.4 (L) 06/11/2020   HGB 12.7 06/11/2020   HCT 39.4 06/11/2020   PLT 267 06/11/2020   GLUCOSE 131 (H) 07/16/2020   CHOL 174 07/16/2020   TRIG 168 (H) 07/16/2020   HDL 58 07/16/2020   LDLDIRECT 115.0 08/02/2018   LDLCALC 89 07/16/2020   ALT 8 07/16/2020   AST 12 07/16/2020   NA 142 07/16/2020   K 3.1 (L) 07/16/2020   CL 101 07/16/2020   CREATININE 0.75 07/16/2020   BUN 6 (L) 07/16/2020   CO2 32 07/16/2020   TSH 1.63 06/11/2020   INR 1.0 04/15/2020   HGBA1C 8.1 (A) 12/30/2020   MICROALBUR <0.7 12/04/2019    Lab Results  Component Value Date   TSH 1.63 06/11/2020   Lab Results  Component Value Date   WBC 3.4 (L) 06/11/2020   HGB 12.7 06/11/2020   HCT 39.4 06/11/2020   MCV 83.7 06/11/2020  PLT 267 06/11/2020   Lab Results  Component Value Date   NA 142 07/16/2020   K 3.1 (L) 07/16/2020   CO2 32 07/16/2020   GLUCOSE 131 (H) 07/16/2020   BUN 6 (L) 07/16/2020   CREATININE 0.75 07/16/2020   BILITOT 0.4 07/16/2020   ALKPHOS 92 04/14/2020   AST 12 07/16/2020   ALT 8 07/16/2020   PROT 6.8 07/16/2020   ALBUMIN 4.2 04/14/2020   CALCIUM 8.8 07/16/2020   ANIONGAP 9 04/16/2020   GFR 66.72 04/14/2020   Lab Results  Component Value Date   CHOL 174  07/16/2020   Lab Results  Component Value Date   HDL 58 07/16/2020   Lab Results  Component Value Date   LDLCALC 89 07/16/2020   Lab Results  Component Value Date   TRIG 168 (H) 07/16/2020   Lab Results  Component Value Date   CHOLHDL 3.0 07/16/2020   Lab Results  Component Value Date   HGBA1C 8.1 (A) 12/30/2020       Colonoscopy: Not completed. Mammogram: Last completed on 08/22/2019. Results were normal. Repeat in 1 year. Due   Assessment & Plan:   Problem List Items Addressed This Visit       Unprioritized   Essential hypertension (Chronic)    Well controlled, no changes to meds. Encouraged heart healthy diet such as the DASH diet and exercise as tolerated.       Relevant Medications   amLODipine (NORVASC) 10 MG tablet   cloNIDine (CATAPRES) 0.1 MG tablet   Asthma   Relevant Medications   albuterol (VENTOLIN HFA) 108 (90 Base) MCG/ACT inhaler   Diabetes mellitus (HCC)   Relevant Orders   Hemoglobin A1c   Lipid panel   Comprehensive metabolic panel   CBC with Differential/Platelet   Microalbumin / creatinine urine ratio   Hyperlipidemia    Encourage heart healthy diet such as MIND or DASH diet, increase exercise, avoid trans fats, simple carbohydrates and processed foods, consider a krill or fish or flaxseed oil cap daily.       Relevant Medications   amLODipine (NORVASC) 10 MG tablet   cloNIDine (CATAPRES) 0.1 MG tablet   Preventative health care - Primary    ghm utd Check labs       Type 2 diabetes mellitus with diabetic peripheral angiopathy without gangrene, without long-term current use of insulin (HCC)   Relevant Medications   amLODipine (NORVASC) 10 MG tablet   cloNIDine (CATAPRES) 0.1 MG tablet   Other Visit Diagnoses     Primary hypertension       Relevant Medications   amLODipine (NORVASC) 10 MG tablet   cloNIDine (CATAPRES) 0.1 MG tablet   Other Relevant Orders   Hemoglobin A1c   Lipid panel   Comprehensive metabolic panel    CBC with Differential/Platelet   Microalbumin / creatinine urine ratio   Hyperlipidemia associated with type 2 diabetes mellitus (HCC)       Relevant Medications   amLODipine (NORVASC) 10 MG tablet   cloNIDine (CATAPRES) 0.1 MG tablet   Other Relevant Orders   Hemoglobin A1c   Lipid panel   Comprehensive metabolic panel   CBC with Differential/Platelet   Microalbumin / creatinine urine ratio   Vitamin D deficiency       Relevant Medications   Vitamin D, Ergocalciferol, (DRISDOL) 1.25 MG (50000 UNIT) CAPS capsule   Colon cancer screening       Relevant Orders   Ambulatory referral to Gastroenterology   Screening mammogram for  breast cancer       Relevant Orders   MM DIGITAL SCREENING BILATERAL   Estrogen deficiency       Relevant Orders   DG Bone Density       Meds ordered this encounter  Medications   albuterol (VENTOLIN HFA) 108 (90 Base) MCG/ACT inhaler    Sig: Inhale 2 puffs into the lungs every 6 (six) hours as needed for wheezing or shortness of breath.    Dispense:  3 each    Refill:  3    **Patient requests 90 days supply**   amLODipine (NORVASC) 10 MG tablet    Sig: Take 1 tablet (10 mg total) by mouth daily.    Dispense:  90 tablet    Refill:  1   cloNIDine (CATAPRES) 0.1 MG tablet    Sig: TAKE 1 TABLET(0.1 MG) BY MOUTH TWICE DAILY    Dispense:  180 tablet    Refill:  1   Vitamin D, Ergocalciferol, (DRISDOL) 1.25 MG (50000 UNIT) CAPS capsule    Sig: Take 1 capsule (50,000 Units total) by mouth every 7 (seven) days.    Dispense:  12 capsule    Refill:  1    I,Zite Okoli,acting as a scribe for [employer], DO.,have documented all relevant documentation on the behalf of [name], DO,as directed by  [name], DO while in the presence of [name], DO.   I,  [name] DO., personally preformed the services described in this documentation.  All medical record entries made by the scribe were at my direction  and in my presence.  I have reviewed the chart and discharge instructions (if applicable) and agree that the record reflects my personal performance and is accurate and complete. 08/20/2021

## 2021-08-23 ENCOUNTER — Encounter: Payer: Medicare PPO | Admitting: Family Medicine

## 2021-08-30 ENCOUNTER — Encounter: Payer: Self-pay | Admitting: *Deleted

## 2021-09-16 ENCOUNTER — Telehealth: Payer: Self-pay | Admitting: *Deleted

## 2021-09-16 MED ORDER — PRAVASTATIN SODIUM 20 MG PO TABS: 20.0000 mg | ORAL_TABLET | Freq: Every day | ORAL | 2 refills | Status: DC

## 2021-09-16 NOTE — Telephone Encounter (Signed)
Patient called back about labs.  Patient advised about lab results and is willing to start med for cholesterol.  Patient is allergic to the crestor (see allergies).

## 2021-09-16 NOTE — Telephone Encounter (Signed)
Patient notified and rx sent in 

## 2021-09-16 NOTE — Telephone Encounter (Signed)
Potassium low----eat/ drink potassium rich foods  Cholesterol--- LDL goal < 100,  HDL >40,  TG < 150.  Diet and exercise will increase HDL and decrease LDL and TG.  Fish,  Fish Oil, Flaxseed oil will also help increase the HDL and decrease Triglycerides.   Recheck labs in 3 months Start crestor 10 mg #30  2 refills .

## 2021-09-30 ENCOUNTER — Other Ambulatory Visit: Payer: Self-pay | Admitting: Family Medicine

## 2021-09-30 ENCOUNTER — Ambulatory Visit (INDEPENDENT_AMBULATORY_CARE_PROVIDER_SITE_OTHER): Payer: Medicare HMO | Admitting: Pharmacist

## 2021-09-30 ENCOUNTER — Telehealth: Payer: Self-pay | Admitting: Pharmacist

## 2021-09-30 DIAGNOSIS — I10 Essential (primary) hypertension: Secondary | ICD-10-CM

## 2021-09-30 DIAGNOSIS — E1165 Type 2 diabetes mellitus with hyperglycemia: Secondary | ICD-10-CM

## 2021-09-30 DIAGNOSIS — M25511 Pain in right shoulder: Secondary | ICD-10-CM

## 2021-09-30 DIAGNOSIS — E785 Hyperlipidemia, unspecified: Secondary | ICD-10-CM

## 2021-09-30 MED ORDER — CYCLOBENZAPRINE HCL 5 MG PO TABS: 5.0000 mg | ORAL_TABLET | Freq: Three times a day (TID) | ORAL | 1 refills | Status: DC | PRN

## 2021-09-30 NOTE — Telephone Encounter (Signed)
During Chronic Care Management phone visit today patient requested refill for cyclobenzaprine due to increase in back spasms the last 2 weeks.  Cyclobenzaprine 5mg  #20 last filled 07/02/2020 - no longer on med list Pharmacy:  [facility]  She is aware that PCP might request OV for refills.

## 2021-09-30 NOTE — Chronic Care Management (AMB) (Signed)
Chronic Care Management Pharmacy Note  09/30/2021 Name:  Heather Walker MRN:  [id number] DOB:  [birth year]  Summary: Blood glucose improved per A1c. Patient is due to reapply for patient assistance program for Trulicity and Humalog Mix 75/25 Blood pressure controlled and recently refilled both blood pressure meds.  Just started Pravachol and so far tolerating well  Recommendations/Changes made from today's visit: Mailed application for Lilly care patient assistance program Requested copy of eye exam form Dr Bobetta Lime office.   Plan: Clinical pharmacist will check in with patient by phone regarding statin tolerance and patient assistance program for Lilly cares in January 2023.   Subjective: Heather Walker is an 60 y.o. year old female who is a primary patient of [name], DO.  The CCM team was consulted for assistance with disease management and care coordination needs.    Engaged with patient by telephone for follow up visit in response to provider referral for pharmacy case management and/or care coordination services.   Consent to Services:  The patient was given information about Chronic Care Management services, agreed to services, and gave verbal consent prior to initiation of services.  Please see initial visit note for detailed documentation.   Patient Care Team: Carollee Herter, Alferd Apa, DO as PCP - General (Family Medicine) [name], RPH-CPP (Pharmacist) Shamleffer, Melanie Crazier, MD as Consulting Physician (Endocrinology) Webb Laws, [place] as Referring Physician (Optometry)  Recent office visits: 08/20/2021 - Fam Med (Dr Carollee Herter) Labs checked. No med changes noted.  Declined vaccines. 07/17/2021 - Optometry (Dr Einar Gip) Eye exam 06/07/2021 - PCP (Dr Charlett Blake) Video Visit - COVID Positive. Symptoms started 8/18 and worsened on 8/20. Started Monupirovir 200mg  - take 400mg  bid for 5 days.  promethazine DM- take 2.5 to 5 mL 3 times a day as  needed for cough.  and albuterol as needed. Recommended using pulse ox - keep O2 in the 90's. MVI with selenium, Vitamin D, probiotic wiht lactobacillus and bifidophilus.    Objective:  Lab Results  Component Value Date   CREATININE 1.02 08/20/2021   CREATININE 0.75 07/16/2020   CREATININE 1.04 (H) 04/16/2020    Lab Results  Component Value Date   HGBA1C 7.1 (H) 08/20/2021   Last diabetic Eye exam:  Lab Results  Component Value Date/Time   HMDIABEYEEXA No Retinopathy 04/15/2019 12:00 AM    Last diabetic Foot exam: No results found for: HMDIABFOOTEX      Component Value Date/Time   CHOL 189 08/20/2021 1109   TRIG 157.0 (H) 08/20/2021 1109   HDL 51.10 08/20/2021 1109   CHOLHDL 4 08/20/2021 1109   VLDL 31.4 08/20/2021 1109   LDLCALC 107 (H) 08/20/2021 1109   LDLCALC 89 07/16/2020 1320   LDLDIRECT 115.0 08/02/2018 1525    Hepatic Function Latest Ref Rng & Units 08/20/2021 07/16/2020 04/14/2020  Total Protein 6.0 - 8.3 g/dL 7.5 6.8 8.4(H)  Albumin 3.5 - 5.2 g/dL 4.2 - 4.2  AST 0 - 37 U/L 17 12 23   ALT 0 - 35 U/L 14 8 32  Alk Phosphatase 39 - 117 U/L 110 - 92  Total Bilirubin 0.2 - 1.2 mg/dL 0.5 0.4 0.4  Bilirubin, Direct 0.0 - 0.2 mg/dL - - -    Lab Results  Component Value Date/Time   TSH 1.63 06/11/2020 03:02 PM   TSH 1.548 04/09/2020 12:11 PM   TSH 1.124 12/08/2014 03:29 PM    CBC Latest Ref Rng & Units 08/20/2021 06/11/2020 04/16/2020  WBC 4.0 - 10.5  K/uL 3.1(L) 3.4(L) 7.4  Hemoglobin 12.0 - 15.0 g/dL 13.5 12.7 13.4  Hematocrit 36.0 - 46.0 % 40.8 39.4 38.6  Platelets 150.0 - 400.0 K/uL 235.0 267 341    Lab Results  Component Value Date/Time   VD25OH 12 (L) 06/11/2020 03:02 PM    Clinical ASCVD: No  The 10-year ASCVD risk score (Arnett DK, et al., 2019) is: 14.5%   Values used to calculate the score:     Age: 30 years     Sex: Female     Is Non-Hispanic African American: Yes     Diabetic: Yes     Tobacco smoker: No     Systolic Blood Pressure: 191 mmHg      Is BP treated: Yes     HDL Cholesterol: 51.1 mg/dL     Total Cholesterol: 189 mg/dL      Social History   Tobacco Use  Smoking Status Never  Smokeless Tobacco Never   BP Readings from Last 3 Encounters:  08/20/21 130/90  07/13/21 (!) 148/100  12/30/20 140/86   Pulse Readings from Last 3 Encounters:  08/20/21 87  07/13/21 86  12/30/20 77   Wt Readings from Last 3 Encounters:  08/20/21 184 lb 9.6 oz (83.7 kg)  07/13/21 194 lb 9.6 oz (88.3 kg)  12/30/20 198 lb 4 oz (89.9 kg)    Assessment: Review of patient past medical history, allergies, medications, health status, including review of consultants reports, laboratory and other test data, was performed as part of comprehensive evaluation and provision of chronic care management services.   SDOH:  (Social Determinants of Health) assessments and interventions performed:  SDOH Interventions    Flowsheet Row Most Recent Value  SDOH Interventions   Financial Strain Interventions Other (Comment)  [mailed application for [place] PAP for [place]  Allergies  Allergen Reactions   Crestor [Rosuvastatin Calcium] Anaphylaxis   Metformin And Related Diarrhea    Nausea and diarrhea   Atorvastatin Other (See Comments)    Neck stiffness   Codeine Itching    Medications Reviewed Today     Reviewed by [name], RPH-CPP (Pharmacist) on 09/30/21 at 1359  Med List Status: <None>   Medication Order Taking? Sig Documenting Provider Last Dose Status Informant  albuterol (VENTOLIN HFA) 108 (90 Base) MCG/ACT inhaler [id number] Yes Inhale 2 puffs into the lungs every 6 (six) hours as needed for wheezing or shortness of breath. Roma Schanz R, DO Taking Active   amLODipine (NORVASC) 10 MG tablet [id number] Yes Take 1 tablet (10 mg total) by mouth daily. Carollee Herter, Kendrick Fries R, DO Taking Active   cloNIDine (CATAPRES) 0.1 MG tablet [id number] Yes TAKE 1 TABLET(0.1 MG) BY MOUTH TWICE DAILY Carollee Herter, Alferd Apa, DO Taking Active   Dulaglutide (TRULICITY) 1.5 LK/4.4WN SOPN [id number] Yes Inject 1.5 mg into the skin once a week. Shamleffer, Melanie Crazier, MD Taking Active            Med Note Jonathon Jordan Jul 01, 2021  2:12 PM) Approved thru [place] PAP thru 10/16/2021  Insulin Lispro Prot & Lispro (HUMALOG MIX 75/25 KWIKPEN) (75-25) 100 UNIT/ML Claiborne Rigg [id number] Yes Inject 25 Units into the skin 2 (two) times daily. Shamleffer, Melanie Crazier, MD Taking Active            Med Note Antony Contras, Adria Dill Jul 01, 2021  2:12 PM) Approved thru [place] PAP thru 10/16/2021  Insulin  Pen Needle 32G X 4 MM MISC [id number] Yes Use as instructed to inject insulin daily Shamleffer, Melanie Crazier, MD Taking Active   Lancets (ONETOUCH DELICA PLUS DUKGUR42H) [place] [id number] Yes Use as instructed to check blood sugar 2 times daily Shamleffer, Melanie Crazier, MD Taking Active   Discontinued 09/30/21 1359 (Change in therapy) ONETOUCH VERIO test strip [id number] Yes Use as instructed to check blood sugar 2 times daily Shamleffer, Melanie Crazier, MD Taking Active   pravastatin (PRAVACHOL) 20 MG tablet [id number] Yes Take 1 tablet (20 mg total) by mouth at bedtime. Roma Schanz R, DO Taking Active   triamcinolone cream (KENALOG) 0.1 % [id number] Yes Apply 1 application topically 2 (two) times daily. [name], DO Taking Active Other  Vitamin D, Ergocalciferol, (DRISDOL) 1.25 MG (50000 UNIT) CAPS capsule [id number] Yes Take 1 capsule (50,000 Units total) by mouth every 7 (seven) days. [name], DO Taking Active             Patient Active Problem List   Diagnosis Date Noted   Type 2 diabetes mellitus with diabetic peripheral angiopathy without gangrene, without long-term current use of insulin ([place]) 08/20/2021   Hyperlipidemia 07/16/2020   Dizziness 04/15/2020   BMI 35.0-35.9,adult 04/15/2020   Weakness 04/15/2020   Abnormal cortisol level 04/14/2020   SIRS (systemic inflammatory  response syndrome) ([place]) 04/06/2020   Pneumonia due to COVID-19 virus 05/14/2019   COVID-19 virus infection 05/13/2019   Renal insufficiency 03/26/2019   At risk for cardiovascular event 03/26/2019   Allergic reaction due to correct medicinal substance properly administered 08/23/2018   Hypertriglyceridemia 08/05/2018   Asthma, chronic, unspecified asthma severity, uncomplicated 93/71/6967   Chronic right shoulder pain 08/05/2018   Muscle spasm 08/05/2018   Insomnia 10/05/2017   Sepsis ([place]) 08/21/2017   Acute right hip pain 08/21/2017   Preventative health care 11/20/2016   Right ankle pain 05/09/2016   Palpitations 12/08/2014   DM (diabetes mellitus) type II uncontrolled, periph vascular disorder 08/03/2014   Leg wound, right 08/03/2014   Hyperglycemia 07/13/2014   Chest pain 07/12/2014   Diabetes mellitus ([place]) 07/12/2014   Acute kidney injury ([place]) 07/12/2014   Abdominal pain, right upper quadrant 05/28/2014   Tachycardia 05/03/2013   Abscess 11/13/2012   Breast pain, right 08/10/2012   Osteoarthritis of left shoulder 03/04/2011   BACK PAIN, LUMBAR, WITH RADICULOPATHY 09/17/2009   CALF PAIN, RIGHT 09/17/2009   RENAL CYST 08/28/2009   LOW BACK PAIN, ACUTE 11/13/2008   FIBROIDS, UTERUS 09/10/2007   UTI 09/03/2007   BACTERIAL VAGINITIS 09/03/2007   Flank pain 09/03/2007   Hypokalemia 09/03/2007   MOLE 06/21/2007   ALLERGIC RHINITIS 06/21/2007   HEMOCCULT POSITIVE STOOL 06/21/2007   SINUSITIS, ACUTE NOS 04/05/2007   REFLUX, ESOPHAGEAL 04/05/2007   CHEST PAIN 04/05/2007   Essential hypertension 03/19/2007   ALLERGIC RHINITIS, SEASONAL 03/09/2007   Asthma 03/09/2007    Immunization History  Administered Date(s) Administered   Influenza,inj,Quad PF,6+ Mos 07/25/2014, 10/05/2015, 10/05/2017   PFIZER(Purple Top)SARS-COV-2 Vaccination 01/09/2020, 02/03/2020   Pneumococcal Polysaccharide-23 07/15/2014   Td 04/12/2006   Tdap 02/24/2011    Conditions to be  addressed/monitored: HTN, HLD, and DMII  Care Plan : General Pharmacy (Adult)  Updates made by [name], RPH-CPP since 09/30/2021 12:00 AM     Problem: Chronic Care management and Medication management   Priority: High  Onset Date: 07/01/2021  Note:   Current Barriers:  Unable to independently afford treatment regimen Unable to achieve control of type  2 diabetes  Unable to maintain control of hypertension Does not have glucometer to monitor blood glucose at home  Pharmacist Clinical Goal(s):  Over the next 90 days, patient will verbalize ability to afford treatment regimen achieve adherence to monitoring guidelines and medication adherence to achieve therapeutic efficacy achieve control of type 2 DM as evidenced by A1c <7.0% maintain control of hypertension as evidenced by blood pressure consistently <140/90  Restart checking blood glucose with glucometer provided today  through collaboration with PharmD and provider.   Interventions: 1:1 collaboration with Carollee Herter, Alferd Apa, DO regarding development and update of comprehensive plan of care as evidenced by provider attestation and co-signature Inter-disciplinary care team collaboration (see longitudinal plan of care) Comprehensive medication review performed; medication list updated in electronic medical record  [place] (see longitudinal plan of care for additional care plan information)  Current Barriers:  Chronic Disease Management support, education, and care coordination needs related to Diabetes, Hypertension, Asthma, Depression   Hypertension BP Readings from Last 3 Encounters:  08/20/21 130/90  07/13/21 (!) 148/100  12/30/20 140/86  Improving but variable control;  BP goal <140/90 Current regimen:  Amlodipine 10 mg daily Clonidine 0.$RemoveBeforeDEI'1mg'ynpBVBITaPSXMHSE$  twice daily Interventions: Reviewed refill history and discussed adherence with blood pressure medications - filled both blood pressure meds for 90 days  08/28/2021 Patient self care activities - Over the next 180 days, patient will: Check blood pressure 2 to 3 times per week, document, and provide at future appointments Ensure daily salt intake < 2300 mg/day  Hyperlipidemia Not at goal; LDL goal < 100 Last LDL was 107 and Tg 157 (08/20/2021) Current regimen:  Pravachol $RemoveBe'20mg'knkWPJiLF$  daily (started 09/2021)  Statin intolerant: anaphylaxis to rosuvastatin and myalgias to atorvastatin Interventions: Discussed diet and exercise Discussed benefits of statin therapy  Reviewed LDL and triglyceride goals  Diabetes Lab Results  Component Value Date   HGBA1C 7.1 (H) 08/20/2021  Not at goal but improved; A1c goal <7% Current regimen:  Humalog 75/25 - injection 25 units twice a day Trulicity 1.$RemoveBeforeD'5mg'WlDVVNaXRRYbSB$  - inject 1.$RemoveBe'5mg'cYgDmOkOe$  once per week. Home blood glucose readings: 110, 125, 139 Denies s/s of hypoglycemia  Interventions: Discussed diet and exercise  Verified Humalog and Trulicity received through [facility] Mailed 7711 application for [employer] patient assistance program  Reviewed home blood glucose readings and reviewed goals  Fasting blood glucose goal (before meals) = 80 to 130 Blood glucose goal after a meal = less than 180  Patient self care activities - Over the next 180 days, patient will: Check blood sugar once daily, document, and provide at future appointments Contact provider with any episodes of hypoglycemia Contact clinical pharmacist if you have any problems with new glucometer  Health Maintenance  Interventions: Called Dr Irven Shelling office to request copy of eye exam from 07/2021 - has to LM on office VM. Provided our office fax [phone] Discussed the following vaccines, but patient declined Flu vaccine Covid vaccine booster Shingrix vaccine    Medication management Current pharmacy: [facility] Comprehensive medication review performed. Reviewed refill history Continue current medication management  strategy Patient self care activities - Over the next 180 days, patient will: Focus on medication adherence by filling and taking medications appropriately  Take medications as prescribed Report any questions or concerns to PharmD and/or provider(s)  Patient Goals/Self-Care Activities Over the next 90 days, patient will:  take medications as prescribed, check glucose daily, document, and provide at future appointments, and check blood pressure 2 to 3 times per week,  document, and provide at future appointments  Follow Up Plan: Telephone follow up appointment with care management team member scheduled for:  1 to 2 months to folllow up patient assistance program for Trulicity and Humalog Mix         Medication Assistance:  Humalog 62/37 and Trulicity obtained through [facility] PAP medication assistance program.  Enrollment ends 62/83/1517 Mailed 6160 application for [employer] to patient today.   Patient's preferred pharmacy is:  [facility] [id number] - [place], [place] - [address] [place] [address] Phone: [phone] Fax: [phone]  [facility] [place], [place] - [address] [place] [address] Phone: [phone] Fax: [phone]    Follow Up:  Patient agrees to Care Plan and Follow-up.  Plan: Telephone follow up appointment with care management team member scheduled for:  January 2023 with clinical pharmacist  [name], PharmD Clinical Pharmacist [place] [facility]

## 2021-09-30 NOTE — Patient Instructions (Signed)
Heather Walker It was a pleasure speaking with you today.  I have attached a summary of our visit today and information about your health goals.   Our next appointment is by telephone on November 04, 2021 at 10:30am  Please call the care guide team at [phone] if you need to cancel or reschedule your appointment.    If you have any questions or concerns, please feel free to contact me either at the phone number below or with a MyChart message.   Keep up the good work!  [name], PharmD Clinical Pharmacist [place] [phone] (direct line)  [phone] (main office number)   The patient verbalized understanding of instructions, educational materials, and care plan provided today and agreed to receive a mailed copy of patient instructions, educational materials, and care plan.    CARE PLAN ENTRY  Current Barriers:  Chronic Disease Management support, education, and care coordination needs related to Diabetes, Hypertension, Asthma, Depression   Hypertension BP Readings from Last 3 Encounters:  08/20/21 130/90  07/13/21 (!) 148/100  12/30/20 140/86   Pharmacist Clinical Goal(s): Over the next 180 days, patient will work with PharmD and providers to achieve BP goal <140/90 Current regimen:  Amlodipine 10 mg daily Clonidine 0.1mg  twice daily Interventions: Reviewed refill history and discussed adherence with blood pressure medications Patient self care activities - Over the next 180 days, patient will: Check blood pressure 2 to 3 times per week, document, and provide at future appointments Ensure daily salt intake < 2300 mg/day Continue current medications for hypertension / to lower blood pressure   Hyperlipidemia Lab Results  Component Value Date/Time   LDLCALC 107 (H) 08/20/2021 11:09 AM   LDLCALC 89 07/16/2020 01:20 PM   LDLDIRECT 115.0 08/02/2018 03:25 PM   Pharmacist Clinical Goal(s): Over the next 180 days, patient will  work with PharmD and providers to achieve LDL goal < 100 Current regimen:  Pravastatin 20mg  daily  Interventions: Discussed diet and exercise Discussed benefits of statin therapy Patient self care activities - Over the next 180 days, patient will: Maintain LDL less than 100 Start pravastatin 20mg  daily  Diabetes Lab Results  Component Value Date/Time   HGBA1C 7.1 (H) 08/20/2021 11:09 AM   HGBA1C 8.1 (A) 12/30/2020 11:02 AM   HGBA1C 6.8 (A) 07/20/2020 09:59 AM   HGBA1C 11.5 (H) 02/20/2019 01:16 PM   Pharmacist Clinical Goal(s): Over the next 180 days, patient will work with PharmD and providers to maintain A1c goal <7% Current regimen:  Humalog Mix 75/25 - injection 25 units twice a day Trulicity 1.5mg  - inject 1.5mg  once per week. Interventions: Discussed diet and exercise  Mail patient assistance program application for [phone] for [employer] care program - Humalog Mix and Trulicty Patient self care activities - Over the next 180 days, patient will: Check blood glucose once daily, document, and provide at future appointments Contact provider with any episodes of hypoglycemia Contact clinical pharmacist if you have any problems with new glucometer  Health Maintenance  Pharmacist Clinical Goal(s) Over the next 90 days, patient will work with PharmD and providers to complete health maintenance screenings/vaccinations Interventions: Called to get copy of last eye exam form 07/2021 Patient self care activities - Over the next 180 days, patient will: Get your annual flu vaccine this season so you are protected. The vaccine is available at [place], downstairs at the [place] or at your preferred local pharmacy.  Also recommend getting updated bivalent  COVID-19 booster. If your last COVID-19 booster was at least 2 months ago, make sure to get the bivalent COVID-19 booster.    Medication management Pharmacist Clinical Goal(s): Over the next 180  days, patient will work with PharmD and providers to maintain optimal medication adherence [facility] Comprehensive medication review performed. Reviewed refill history Continue current medication management strategy Patient self care activities - Over the next 180 days, patient will: Focus on medication adherence by filling and taking medications appropriately  Take medications as prescribed Report any questions or concerns to PharmD and/or provider(s)

## 2021-10-04 ENCOUNTER — Ambulatory Visit
Admission: RE | Admit: 2021-10-04 | Discharge: 2021-10-04 | Disposition: A | Discharge status: Home / Self Care | Payer: Medicare HMO | Source: Ambulatory Visit | Attending: Family Medicine | Admitting: Family Medicine

## 2021-10-04 DIAGNOSIS — E2839 Other primary ovarian failure: Secondary | ICD-10-CM

## 2021-10-04 DIAGNOSIS — Z780 Asymptomatic menopausal state: Secondary | ICD-10-CM | POA: Diagnosis not present

## 2021-10-07 ENCOUNTER — Ambulatory Visit
Admission: RE | Admit: 2021-10-07 | Discharge: 2021-10-07 | Disposition: A | Discharge status: Home / Self Care | Payer: Medicare HMO | Source: Ambulatory Visit | Attending: Family Medicine | Admitting: Family Medicine

## 2021-10-07 DIAGNOSIS — Z1231 Encounter for screening mammogram for malignant neoplasm of breast: Secondary | ICD-10-CM | POA: Diagnosis not present

## 2021-10-15 ENCOUNTER — Telehealth: Payer: Self-pay | Admitting: Family Medicine

## 2021-10-15 NOTE — Telephone Encounter (Signed)
Tiffany from [facility] called regarding prescription that was faxed on 12/23. Please contact direct line [phone].

## 2021-10-16 DIAGNOSIS — I10 Essential (primary) hypertension: Secondary | ICD-10-CM

## 2021-10-16 DIAGNOSIS — E785 Hyperlipidemia, unspecified: Secondary | ICD-10-CM | POA: Diagnosis not present

## 2021-10-16 DIAGNOSIS — E1169 Type 2 diabetes mellitus with other specified complication: Secondary | ICD-10-CM

## 2021-10-16 DIAGNOSIS — E1165 Type 2 diabetes mellitus with hyperglycemia: Secondary | ICD-10-CM | POA: Diagnosis not present

## 2021-10-19 NOTE — Telephone Encounter (Signed)
Attempted to call back. There was a 20 min hold time. Will attempt to call back later or receive another fax

## 2021-11-04 ENCOUNTER — Ambulatory Visit (INDEPENDENT_AMBULATORY_CARE_PROVIDER_SITE_OTHER): Payer: Medicare HMO | Admitting: Pharmacist

## 2021-11-04 DIAGNOSIS — E1169 Type 2 diabetes mellitus with other specified complication: Secondary | ICD-10-CM

## 2021-11-04 DIAGNOSIS — I10 Essential (primary) hypertension: Secondary | ICD-10-CM

## 2021-11-04 DIAGNOSIS — E1165 Type 2 diabetes mellitus with hyperglycemia: Secondary | ICD-10-CM

## 2021-11-04 DIAGNOSIS — E785 Hyperlipidemia, unspecified: Secondary | ICD-10-CM

## 2021-11-07 NOTE — Patient Instructions (Signed)
Heather Walker It was a pleasure speaking with you today.  I have attached a summary of our visit today and information about your health goals.   Please call the care guide team at [phone] if you need to cancel or reschedule your appointment.   If you have any questions or concerns, please feel free to contact me either at the phone number below or with a MyChart message.   Keep up the good work!  [name], PharmD Clinical Pharmacist [place] [phone] (direct line)  [phone] (main office number)   The patient verbalized understanding of instructions, educational materials, and care plan provided today and agreed to receive a mailed copy of patient instructions, educational materials, and care plan.    CARE PLAN ENTRY  Hypertension BP Readings from Last 3 Encounters:  08/20/21 130/90  07/13/21 (!) 148/100  12/30/20 140/86   Pharmacist Clinical Goal(s): Over the next 180 days, patient will work with PharmD and providers to achieve BP goal <140/90 Current regimen:  Amlodipine 10 mg daily Clonidine 0.1mg  twice daily Interventions: Reviewed refill history and discussed adherence with blood pressure medications Patient self care activities - Over the next 180 days, patient will: Check blood pressure 2 to 3 times per week, document, and provide at future appointments Ensure daily salt intake < 2300 mg/day Continue current medications for hypertension / to lower blood pressure   Hyperlipidemia Lab Results  Component Value Date/Time   LDLCALC 107 (H) 08/20/2021 11:09 AM   LDLCALC 89 07/16/2020 01:20 PM   LDLDIRECT 115.0 08/02/2018 03:25 PM   Pharmacist Clinical Goal(s): Over the next 180 days, patient will work with PharmD and providers to achieve LDL goal < 100 Current regimen:  Pravastatin 20mg  daily  Interventions: Discussed diet and exercise Discussed benefits of statin therapy Patient self care activities - Over the  next 180 days, patient will: Maintain LDL less than 100 Continue pravastatin 20mg  daily We will check your cholesterol / lipids again in then next 1 to 2 months.  Diabetes Lab Results  Component Value Date/Time   HGBA1C 7.1 (H) 08/20/2021 11:09 AM   HGBA1C 8.1 (A) 12/30/2020 11:02 AM   HGBA1C 6.8 (A) 07/20/2020 09:59 AM   HGBA1C 11.5 (H) 02/20/2019 01:16 PM   Pharmacist Clinical Goal(s): Over the next 180 days, patient will work with PharmD and providers to maintain A1c goal <7% Current regimen:  Humalog Mix 75/25 - injection 25 units twice a day Trulicity 1.5mg  - inject 1.5mg  once per week. Interventions: Discussed diet and exercise  Reminded patient to complete patient assistance program application for [phone] for Lilly care program - Humalog Mix and Trulicty and returns to either Dr [facility] office or our office.  Patient self care activities - Over the next 180 days, patient will: Check blood glucose once daily, document, and provide at future appointments Contact provider with any episodes of hypoglycemia Contact clinical pharmacist if you have any problems with your glucometer or medication questions  Health Maintenance  Pharmacist Clinical Goal(s) Over the next 90 days, patient will work with PharmD and providers to complete health maintenance screenings/vaccinations Interventions: Called to get copy of last eye exam form 07/2021 Patient self care activities - Over the next 180 days, patient will: Consider getting your annual flu vaccine this season so you are protected. The vaccine is available at [place], downstairs at the [place] or at your preferred local pharmacy.  Consider getting updated bivalent COVID-19 booster.  If your last COVID-19 booster was at least 2 months ago, make sure to get the bivalent COVID-19 booster.   Medication management Pharmacist Clinical Goal(s): Over the next 180 days, patient will work with PharmD  and providers to maintain optimal medication adherence [facility] Comprehensive medication review performed. Reviewed refill history Continue current medication management strategy Patient self care activities - Over the next 180 days, patient will: Focus on medication adherence by filling and taking medications appropriately  Take medications as prescribed Report any questions or concerns to PharmD and/or provider(s)

## 2021-11-07 NOTE — Chronic Care Management (AMB) (Signed)
Chronic Care Management Pharmacy Note  11/07/2021 Name:  Heather Walker MRN:  [id number] DOB:  [birth year]  Summary: Blood glucose improved per A1c. Patient is due to reapply for patient assistance program for Trulicity and Humalog Mix 75/25. In December mailed her applications. Reminded her to complete and return to either our office or Dr [facility] office.  Blood pressure controlled and recently refilled both blood pressure meds.  Started Pravachol in December 2022 and so far tolerating well  Plan: Clinical pharmacist will check in with patient by phone regarding patient assistance program for [place] cares in Feb 2023.   Subjective: Heather Walker is an 61 y.o. year old female who is a primary patient of [name], DO.  The CCM team was consulted for assistance with disease management and care coordination needs.    Engaged with patient by telephone for follow up visit in response to provider referral for pharmacy case management and/or care coordination services.   Consent to Services:  The patient was given information about Chronic Care Management services, agreed to services, and gave verbal consent prior to initiation of services.  Please see initial visit note for detailed documentation.   Patient Care Team: Carollee Herter, Alferd Apa, DO as PCP - General (Family Medicine) [name], RPH-CPP (Pharmacist) Shamleffer, Melanie Crazier, MD as Consulting Physician (Endocrinology) Webb Laws, [place] as Referring Physician (Optometry)  Recent office visits: 08/20/2021 - Fam Med (Dr Carollee Herter) Labs checked. No med changes noted.  Declined vaccines. 07/17/2021 - Optometry (Dr Einar Gip) Eye exam 06/07/2021 - PCP (Dr Charlett Blake) Video Visit - COVID Positive. Symptoms started 8/18 and worsened on 8/20. Started Monupirovir 211m - take 4072mbid for 5 days.  promethazine DM- take 2.5 to 5 mL 3 times a day as needed for cough.  and albuterol as needed. Recommended using  pulse ox - keep O2 in the 90's. MVI with selenium, Vitamin D, probiotic wiht lactobacillus and bifidophilus.    Objective:  Lab Results  Component Value Date   CREATININE 1.02 08/20/2021   CREATININE 0.75 07/16/2020   CREATININE 1.04 (H) 04/16/2020    Lab Results  Component Value Date   HGBA1C 7.1 (H) 08/20/2021   Last diabetic Eye exam:  Lab Results  Component Value Date/Time   HMDIABEYEEXA No Retinopathy 04/15/2019 12:00 AM    Last diabetic Foot exam: No results found for: HMDIABFOOTEX      Component Value Date/Time   CHOL 189 08/20/2021 1109   TRIG 157.0 (H) 08/20/2021 1109   HDL 51.10 08/20/2021 1109   CHOLHDL 4 08/20/2021 1109   VLDL 31.4 08/20/2021 1109   LDLCALC 107 (H) 08/20/2021 1109   LDLCALC 89 07/16/2020 1320   LDLDIRECT 115.0 08/02/2018 1525    Hepatic Function Latest Ref Rng & Units 08/20/2021 07/16/2020 04/14/2020  Total Protein 6.0 - 8.3 g/dL 7.5 6.8 8.4(H)  Albumin 3.5 - 5.2 g/dL 4.2 - 4.2  AST 0 - 37 U/L _0 ALT 0 - 35 U/L 14 8 32  Alk Phosphatase 39 - 117 U/L 110 - 92  Total Bilirubin 0.2 - 1.2 mg/dL 0.5 0.4 0.4  Bilirubin, Direct 0.0 - 0.2 mg/dL - - -    Lab Results  Component Value Date/Time   TSH 1.63 06/11/2020 03:02 PM   TSH 1.548 04/09/2020 12:11 PM   TSH 1.124 12/08/2014 03:29 PM    CBC Latest Ref Rng & Units 08/20/2021 06/11/2020 04/16/2020  WBC 4.0 - 10.5 K/uL 3.1(L) 3.4(L) 7.4  Hemoglobin 12.0 - 15.0 g/dL 13.5 12.7 13.4  Hematocrit 36.0 - 46.0 % 40.8 39.4 38.6  Platelets 150.0 - 400.0 K/uL 235.0 267 341    Lab Results  Component Value Date/Time   VD25OH 12 (L) 06/11/2020 03:02 PM    Clinical ASCVD: No  The 10-year ASCVD risk score (Arnett DK, et al., 2019) is: 15.3%   Values used to calculate the score:     Age: 61 years     Sex: Female     Is Non-Hispanic African American: Yes     Diabetic: Yes     Tobacco smoker: No     Systolic Blood Pressure: 209 mmHg     Is BP treated: Yes     HDL Cholesterol: 51.1 mg/dL      Total Cholesterol: 189 mg/dL      Social History   Tobacco Use  Smoking Status Never  Smokeless Tobacco Never   BP Readings from Last 3 Encounters:  08/20/21 130/90  07/13/21 (!) 148/100  12/30/20 140/86   Pulse Readings from Last 3 Encounters:  08/20/21 87  07/13/21 86  12/30/20 77   Wt Readings from Last 3 Encounters:  08/20/21 184 lb 9.6 oz (83.7 kg)  07/13/21 194 lb 9.6 oz (88.3 kg)  12/30/20 198 lb 4 oz (89.9 kg)    Assessment: Review of patient past medical history, allergies, medications, health status, including review of consultants reports, laboratory and other test data, was performed as part of comprehensive evaluation and provision of chronic care management services.   SDOH:  (Social Determinants of Health) assessments and interventions performed:  SDOH Interventions    Flowsheet Row Most Recent Value  SDOH Interventions   Financial Strain Interventions Other (Comment)  [reminded patient to completed 4709 application for [employer]ares.]  Physical Activity Interventions Other (Comments)  [encouraged patient to restart regular exercise. goal of 150 mintues per week.]       CCM Care Plan  Allergies  Allergen Reactions   Crestor [Rosuvastatin Calcium] Anaphylaxis   Metformin And Related Diarrhea    Nausea and diarrhea   Atorvastatin Other (See Comments)    Neck stiffness   Codeine Itching    Medications Reviewed Today     Reviewed by [name], RPH-CPP (Pharmacist) on 11/07/21 at 0734  Med List Status: <None>   Medication Order Taking? Sig Documenting Provider Last Dose Status Informant  albuterol (VENTOLIN HFA) 108 (90 Base) MCG/ACT inhaler [id number] Yes Inhale 2 puffs into the lungs every 6 (six) hours as needed for wheezing or shortness of breath. Roma Schanz R, DO Taking Active   amLODipine (NORVASC) 10 MG tablet [id number] Yes Take 1 tablet (10 mg total) by mouth daily. Roma Schanz R, DO Taking Active   cloNIDine (CATAPRES) 0.1  MG tablet [id number] Yes TAKE 1 TABLET(0.1 MG) BY MOUTH TWICE DAILY Carollee Herter, Alferd Apa, DO Taking Active   cyclobenzaprine (FLEXERIL) 5 MG tablet [id number] Yes Take 1 tablet (5 mg total) by mouth 3 (three) times daily as needed for muscle spasms. Roma Schanz R, DO Taking Active   Dulaglutide (TRULICITY) 1.5 WH/6.7RF SOPN [id number] Yes Inject 1.5 mg into the skin once a week. Shamleffer, Melanie Crazier, MD Taking Active            Med Note Jonathon Jordan Jul 01, 2021  2:12 PM) Approved thru [place] PAP thru 10/16/2021  Insulin Lispro Prot & Lispro (HUMALOG MIX 75/25 KWIKPEN) (75-25) 100 UNIT/ML Claiborne Rigg [id number] Yes Inject  25 Units into the skin 2 (two) times daily. Shamleffer, Melanie Crazier, MD Taking Active            Med Note Antony Contras, Adria Dill Jul 01, 2021  2:12 PM) Approved thru [place] PAP thru 10/16/2021  Insulin Pen Needle 32G X 4 MM MISC [id number] Yes Use as instructed to inject insulin daily Shamleffer, Melanie Crazier, MD Taking Active   Lancets (ONETOUCH DELICA PLUS [id number]) [place] [id number] Yes Use as instructed to check blood sugar 2 times daily Shamleffer, Melanie Crazier, MD Taking Active   [facility] VERIO test strip [id number] Yes Use as instructed to check blood sugar 2 times daily Shamleffer, Melanie Crazier, MD Taking Active   pravastatin (PRAVACHOL) 20 MG tablet [id number] Yes Take 1 tablet (20 mg total) by mouth at bedtime. Roma Schanz R, DO Taking Active   triamcinolone cream (KENALOG) 0.1 % [id number] Yes Apply 1 application topically 2 (two) times daily. [name], DO Taking Active Other  Vitamin D, Ergocalciferol, (DRISDOL) 1.25 MG (50000 UNIT) CAPS capsule [id number] No Take 1 capsule (50,000 Units total) by mouth every 7 (seven) days.  Patient not taking: Reported on 11/04/2021   [name], DO Not Taking Active             Patient Active Problem List   Diagnosis Date Noted   Type 2 diabetes mellitus with diabetic  peripheral angiopathy without gangrene, without long-term current use of insulin ([place]) 08/20/2021   Hyperlipidemia 07/16/2020   Dizziness 04/15/2020   BMI 35.0-35.9,adult 04/15/2020   Weakness 04/15/2020   Abnormal cortisol level 04/14/2020   SIRS (systemic inflammatory response syndrome) ([place]) 04/06/2020   Pneumonia due to COVID-19 virus 05/14/2019   COVID-19 virus infection 05/13/2019   Renal insufficiency 03/26/2019   At risk for cardiovascular event 03/26/2019   Allergic reaction due to correct medicinal substance properly administered 08/23/2018   Hypertriglyceridemia 08/05/2018   Asthma, chronic, unspecified asthma severity, uncomplicated 00/76/2263   Chronic right shoulder pain 08/05/2018   Muscle spasm 08/05/2018   Insomnia 10/05/2017   Sepsis ([place]) 08/21/2017   Acute right hip pain 08/21/2017   Preventative health care 11/20/2016   Right ankle pain 05/09/2016   Palpitations 12/08/2014   DM (diabetes mellitus) type II uncontrolled, periph vascular disorder 08/03/2014   Leg wound, right 08/03/2014   Hyperglycemia 07/13/2014   Chest pain 07/12/2014   Diabetes mellitus ([place]) 07/12/2014   Acute kidney injury ([place]) 07/12/2014   Abdominal pain, right upper quadrant 05/28/2014   Tachycardia 05/03/2013   Abscess 11/13/2012   Breast pain, right 08/10/2012   Osteoarthritis of left shoulder 03/04/2011   BACK PAIN, LUMBAR, WITH RADICULOPATHY 09/17/2009   CALF PAIN, RIGHT 09/17/2009   RENAL CYST 08/28/2009   LOW BACK PAIN, ACUTE 11/13/2008   FIBROIDS, UTERUS 09/10/2007   UTI 09/03/2007   BACTERIAL VAGINITIS 09/03/2007   Flank pain 09/03/2007   Hypokalemia 09/03/2007   MOLE 06/21/2007   ALLERGIC RHINITIS 06/21/2007   HEMOCCULT POSITIVE STOOL 06/21/2007   SINUSITIS, ACUTE NOS 04/05/2007   REFLUX, ESOPHAGEAL 04/05/2007   CHEST PAIN 04/05/2007   Essential hypertension 03/19/2007   ALLERGIC RHINITIS, SEASONAL 03/09/2007   Asthma 03/09/2007    Immunization History   Administered Date(s) Administered   Influenza,inj,Quad PF,6+ Mos 07/25/2014, 10/05/2015, 10/05/2017   PFIZER(Purple Top)SARS-COV-2 Vaccination 01/09/2020, 02/03/2020   Pneumococcal Polysaccharide-23 07/15/2014   Td 04/12/2006   Tdap 02/24/2011    Conditions to be addressed/monitored: HTN, HLD, and DMII  Care Plan :  General Pharmacy (Adult)  Updates made by [name], RPH-CPP since 11/07/2021 12:00 AM     Problem: Chronic Care management and Medication management   Priority: High  Onset Date: 07/01/2021  Note:   Current Barriers:  Unable to independently afford treatment regimen Unable to achieve control of type 2 diabetes  Unable to maintain control of hypertension Does not have glucometer to monitor blood glucose at home  Pharmacist Clinical Goal(s):  Over the next 90 days, patient will verbalize ability to afford treatment regimen achieve adherence to monitoring guidelines and medication adherence to achieve therapeutic efficacy achieve control of type 2 DM as evidenced by A1c <7.0% maintain control of hypertension as evidenced by blood pressure consistently <140/90  Restart checking blood glucose with glucometer provided today  through collaboration with PharmD and provider.   Interventions: 1:1 collaboration with Carollee Herter, Alferd Apa, DO regarding development and update of comprehensive plan of care as evidenced by provider attestation and co-signature Inter-disciplinary care team collaboration (see longitudinal plan of care) Comprehensive medication review performed; medication list updated in electronic medical record   Hypertension BP Readings from Last 3 Encounters:  08/20/21 130/90  07/13/21 (!) 148/100  12/30/20 140/86  Improving but variable control;  BP goal <140/90 Current regimen:  Amlodipine 10 mg daily Clonidine 0.26m twice daily Patient is checking blood pressure at home about once a week. Reports blood pressure ranges from 135 to 145 /  80's Interventions: Reviewed refill history and discussed adherence with blood pressure medications. Looks like pharmacy only filled clonidine in January for #60 tablets. Coordinated with pharmacy to update prescription for 90 days supply - reminded that #180 - 90 days. Per pharmacist, Rx has been corrected as patient had not picked up yet.  Patient self care activities - Over the next 180 days, patient will: Check blood pressure 2 to 3 times per week, document, and provide at future appointments Ensure daily salt intake < 2300 mg/day  Hyperlipidemia Not at goal; LDL goal < 100 Last LDL was 107 and Tg 157 (08/20/2021) Current regimen:  Pravachol 227mdaily (started 09/2021)  Statin intolerant: anaphylaxis to rosuvastatin and myalgias to atorvastatin She reports today that she is tolerating pravastatin well - no myalgias Interventions: Discussed diet and exercise Discussed benefits of statin therapy. Continue to take pravastatin daily Reviewed LDL and triglyceride goals. Recheck lipid in 1 to 2 months.   Diabetes Lab Results  Component Value Date   HGBA1C 7.1 (H) 08/20/2021  Not at goal but improved; A1c goal <7% Current regimen:  Humalog 75/25 - injection 25 units twice a day Trulicity 1.3.3LK inject 1.26m70mnce per week. Home blood glucose readings: 110, 125, 139 Denies s/s of hypoglycemia  Interventions: Discussed diet and exercise  Verified Humalog and Trulicity [id number] for [employer] assistance program at last visit. Patient endorses she has received and is working on appLocation managerill take [facility] once complete.  Reviewed home blood glucose readings and reviewed goals  Fasting blood glucose goal (before meals) = 80 to 130 Blood glucose goal after a meal = less than 180   Health Maintenance  Interventions: Called Dr McFIrven Shellingfice to request copy of eye exam from 07/2021 - has to LM on office  VM. Provided our office fax [phone])  Discussed the following vaccines, but patient declined Flu vaccine Covid vaccine booster Shingrix vaccine   Medication management Current pharmacy: [facility] Interventions Comprehensive medication review performed. Reviewed refill  history and coordinated with pharmacy for 90 day supply refill for clonidine Continue current medication management strategy Patient self care activities - Over the next 180 days, patient will: Focus on medication adherence by filling and taking medications appropriately  Take medications as prescribed Report any questions or concerns to PharmD and/or provider(s)  Patient Goals/Self-Care Activities Over the next 90 days, patient will:  take medications as prescribed, check glucose daily, document, and provide at future appointments, and check blood pressure 2 to 3 times per week, document, and provide at future appointments  Follow Up Plan: Telephone follow up appointment with care management team member scheduled for:  1 to 2 months to folllow up patient assistance program for Trulicity and Humalog Mix         Medication Assistance:  Humalog 51/83 and Trulicity obtained through [place] PAP medication assistance program.  Enrollment ends 43/73/5789 Mailed 7847 application for [employer] to patient 09/2021, she reports she is working on Location manager.   Patient's preferred pharmacy is:  [facility] [phone] - [place], [place] - [address] [place] [address] Phone: [phone] Fax: [phone]  [facility] [place], [place] - [address] [place] [address] Phone: [phone] Fax: [phone]  Follow Up:  Patient agrees to Care Plan and Follow-up.  Plan: Telephone follow up appointment with care management team member scheduled for:  Feb 2023  with clinical pharmacist  [name], PharmD Clinical Pharmacist [place] [facility]

## 2021-11-08 ENCOUNTER — Telehealth: Payer: Self-pay

## 2021-11-08 NOTE — Telephone Encounter (Signed)
[facility] paperwork has been faxed for Trulicity and Humalog. Vm also left for patient to schedule follow up appointment

## 2021-11-16 DIAGNOSIS — E785 Hyperlipidemia, unspecified: Secondary | ICD-10-CM | POA: Diagnosis not present

## 2021-11-16 DIAGNOSIS — E1165 Type 2 diabetes mellitus with hyperglycemia: Secondary | ICD-10-CM | POA: Diagnosis not present

## 2021-11-16 DIAGNOSIS — E1169 Type 2 diabetes mellitus with other specified complication: Secondary | ICD-10-CM | POA: Diagnosis not present

## 2021-11-16 DIAGNOSIS — I10 Essential (primary) hypertension: Secondary | ICD-10-CM

## 2021-11-22 ENCOUNTER — Telehealth: Payer: Self-pay

## 2021-11-22 NOTE — Telephone Encounter (Signed)
Patient advise that [facility] application has been approved through end of 2023 calendar year. Approval was received through fax.

## 2021-12-03 ENCOUNTER — Telehealth: Payer: Medicare Other

## 2021-12-25 ENCOUNTER — Other Ambulatory Visit: Payer: Self-pay | Admitting: Family Medicine

## 2022-02-08 DIAGNOSIS — Z1152 Encounter for screening for COVID-19: Secondary | ICD-10-CM | POA: Diagnosis not present

## 2022-02-15 ENCOUNTER — Ambulatory Visit (INDEPENDENT_AMBULATORY_CARE_PROVIDER_SITE_OTHER): Payer: Medicare Other | Admitting: Family Medicine

## 2022-02-15 VITALS — BP 132/88 | HR 71 | Temp 98.5°F | Resp 18 | Ht 63.0 in | Wt 192.4 lb

## 2022-02-15 DIAGNOSIS — E2740 Unspecified adrenocortical insufficiency: Secondary | ICD-10-CM | POA: Diagnosis not present

## 2022-02-15 DIAGNOSIS — Z794 Long term (current) use of insulin: Secondary | ICD-10-CM | POA: Diagnosis not present

## 2022-02-15 DIAGNOSIS — R011 Cardiac murmur, unspecified: Secondary | ICD-10-CM | POA: Diagnosis not present

## 2022-02-15 DIAGNOSIS — J4520 Mild intermittent asthma, uncomplicated: Secondary | ICD-10-CM | POA: Diagnosis not present

## 2022-02-15 DIAGNOSIS — Z1211 Encounter for screening for malignant neoplasm of colon: Secondary | ICD-10-CM

## 2022-02-15 DIAGNOSIS — J9601 Acute respiratory failure with hypoxia: Secondary | ICD-10-CM

## 2022-02-15 DIAGNOSIS — E1165 Type 2 diabetes mellitus with hyperglycemia: Secondary | ICD-10-CM | POA: Diagnosis not present

## 2022-02-15 DIAGNOSIS — E1151 Type 2 diabetes mellitus with diabetic peripheral angiopathy without gangrene: Secondary | ICD-10-CM

## 2022-02-15 DIAGNOSIS — I10 Essential (primary) hypertension: Secondary | ICD-10-CM | POA: Diagnosis not present

## 2022-02-15 DIAGNOSIS — E1169 Type 2 diabetes mellitus with other specified complication: Secondary | ICD-10-CM | POA: Diagnosis not present

## 2022-02-15 DIAGNOSIS — E785 Hyperlipidemia, unspecified: Secondary | ICD-10-CM | POA: Diagnosis not present

## 2022-02-15 LAB — COMPREHENSIVE METABOLIC PANEL
ALT: 10 U/L (ref 0–35)
AST: 13 U/L (ref 0–37)
Albumin: 4 g/dL (ref 3.5–5.2)
Alkaline Phosphatase: 107 U/L (ref 39–117)
BUN: 8 mg/dL (ref 6–23)
CO2: 33 mEq/L — ABNORMAL HIGH (ref 19–32)
Calcium: 8.4 mg/dL (ref 8.4–10.5)
Chloride: 102 mEq/L (ref 96–112)
Creatinine, Ser: 0.95 mg/dL (ref 0.40–1.20)
GFR: 65.67 mL/min (ref >60.00)
Glucose, Bld: 98 mg/dL (ref 70–99)
Potassium: 3.3 mEq/L — ABNORMAL LOW (ref 3.5–5.1)
Sodium: 141 mEq/L (ref 135–145)
Total Bilirubin: 0.5 mg/dL (ref 0.2–1.2)
Total Protein: 7.1 g/dL (ref 6.0–8.3)

## 2022-02-15 LAB — LIPID PANEL
Cholesterol: 197 mg/dL (ref 0–200)
HDL: 70.5 mg/dL (ref >39.00)
LDL Cholesterol: 106 mg/dL — ABNORMAL HIGH (ref 0–99)
NonHDL: 126.03
Total CHOL/HDL Ratio: 3
Triglycerides: 102 mg/dL (ref 0.0–149.0)
VLDL: 20.4 mg/dL (ref 0.0–40.0)

## 2022-02-15 LAB — MICROALBUMIN / CREATININE URINE RATIO
Creatinine,U: 32.4 mg/dL
Microalb Creat Ratio: 12 mg/g (ref 0.0–30.0)
Microalb, Ur: 3.9 mg/dL — ABNORMAL HIGH (ref 0.0–1.9)

## 2022-02-15 LAB — HEMOGLOBIN A1C: Hgb A1c MFr Bld: 6.8 % — ABNORMAL HIGH (ref 4.6–6.5)

## 2022-02-15 MED ORDER — CLONIDINE HCL 0.1 MG PO TABS: ORAL_TABLET | ORAL | 1 refills | Status: DC

## 2022-02-15 MED ORDER — ALBUTEROL SULFATE HFA 108 (90 BASE) MCG/ACT IN AERS: 2.0000 | INHALATION_SPRAY | Freq: Four times a day (QID) | RESPIRATORY_TRACT | 3 refills | Status: DC | PRN

## 2022-02-15 MED ORDER — TRIAMTERENE-HCTZ 37.5-25 MG PO TABS: 1.0000 | ORAL_TABLET | Freq: Every day | ORAL | 3 refills | Status: DC

## 2022-02-15 MED ORDER — AMLODIPINE BESYLATE 10 MG PO TABS: 10.0000 mg | ORAL_TABLET | Freq: Every day | ORAL | 1 refills | Status: DC

## 2022-02-15 NOTE — Progress Notes (Signed)
? ?Subjective:  ? ?By signing my name below, I, Zite Okoli, attest that this documentation has been prepared under the direction and in the presence of [name], DO. 02/15/2022 ? ? Patient ID: Heather Walker, female    DOB: [birth year], 61 y.o.   MRN: [id number] ? ?Chief Complaint  ?Patient presents with  ? Hypertension  ? Hyperlipidemia  ? Diabetes  ? Follow-up  ? ? ?HPI ?Patient is in today for an office visit and 6 months f/u. ? ?She is doing well at this time.  ? ?She reports she has been dealing with allergies since the season changed and been having symptoms. ? ?She will like to restart Maxzide because her feet and hands are swollen at the end of the day. ? ?She is requesting for a refill on 0.1 mg clonidine, 10 mg amlodipine and albuterol inhaler. ? ?UTD on vision. ? ?She is not UTD on shingles and tetanus vaccines. She has 2 Covid-19 vaccines at this time. ? ?Past Medical History:  ?Diagnosis Date  ? Arthritis   ? Asthma   ? Depression   ? Diabetes mellitus without complication ([place])   ? Heart murmur   ? Hypertension   ? ? ?Past Surgical History:  ?Procedure Laterality Date  ? ABDOMINAL HYSTERECTOMY    ? APPENDECTOMY    ? BLADDER SUSPENSION    ? CESAREAN SECTION    ? 3 previous  ? TEE WITHOUT CARDIOVERSION N/A 08/24/2017  ? Procedure: TRANSESOPHAGEAL ECHOCARDIOGRAM (TEE);  Surgeon: Larey Dresser, MD;  Location: [facility] ENDOSCOPY;  Service: Cardiovascular;  Laterality: N/A;  ? TUBAL LIGATION    ? ? ?Family History  ?Problem Relation Age of Onset  ? Hypertension Mother   ? Asthma Mother   ? Hypertension Sister   ? Asthma Sister   ? Diabetes Sister   ? Hypertension Maternal Grandmother   ? Heart defect Sister   ? Asthma Sister   ? Aneurysm Sister   ? Asthma Sister   ? ? ?Social History  ? ?Socioeconomic History  ? Marital status: Married  ?  Spouse name: Not on file  ? Number of children: Not on file  ? Years of education: ged  ? Highest education level: Not on file  ?Occupational History  ? Occupation:  disabled  ?Tobacco Use  ? Smoking status: Never  ? Smokeless tobacco: Never  ?Vaping Use  ? Vaping Use: Never used  ?Substance and Sexual Activity  ? Alcohol use: No  ? Drug use: No  ? Sexual activity: Yes  ?  Partners: Male  ?  Birth control/protection: Surgical  ?Other Topics Concern  ? Not on file  ?Social History Narrative  ? Not on file  ? ?Social Determinants of Health  ? ?Financial Resource Strain: Medium Risk  ? Difficulty of Paying Living Expenses: Somewhat hard  ?Food Insecurity: No Food Insecurity  ? Worried About Charity fundraiser in the Last Year: Never true  ? Ran Out of Food in the Last Year: Never true  ?Transportation Needs: No Transportation Needs  ? Lack of Transportation (Medical): No  ? Lack of Transportation (Non-Medical): No  ?Physical Activity: Insufficiently Active  ? Days of Exercise per Week: 3 days  ? Minutes of Exercise per Session: 30 min  ?Stress: No Stress Concern Present  ? Feeling of Stress : Not at all  ?Social Connections: Moderately Integrated  ? Frequency of Communication with Friends and Family: More than three times a week  ?  Frequency of Social Gatherings with Friends and Family: More than three times a week  ? Attends Religious Services: More than 4 times per year  ? Active Member of Clubs or Organizations: No  ? Attends Archivist Meetings: Never  ? Marital Status: Married  ?Intimate Partner Violence: Not At Risk  ? Fear of Current or Ex-Partner: No  ? Emotionally Abused: No  ? Physically Abused: No  ? Sexually Abused: No  ? ? ?Outpatient Medications Prior to Visit  ?Medication Sig Dispense Refill  ? cyclobenzaprine (FLEXERIL) 5 MG tablet Take 1 tablet (5 mg total) by mouth 3 (three) times daily as needed for muscle spasms. 30 tablet 1  ? Dulaglutide (TRULICITY) 1.5 JJ/0.0XF SOPN Inject 1.5 mg into the skin once a week. 6 mL 3  ? Insulin Lispro Prot & Lispro (HUMALOG MIX 75/25 KWIKPEN) (75-25) 100 UNIT/ML Kwikpen Inject 25 Units into the skin 2 (two) times  daily. 45 mL 3  ? Insulin Pen Needle 32G X 4 MM MISC Use as instructed to inject insulin daily 100 each 5  ? Lancets (ONETOUCH DELICA PLUS [id number]) MISC Use as instructed to check blood sugar 2 times daily 100 each 6  ? ONETOUCH VERIO test strip Use as instructed to check blood sugar 2 times daily 100 each 5  ? pravastatin (PRAVACHOL) 20 MG tablet TAKE 1 TABLET(20 MG) BY MOUTH AT BEDTIME 90 tablet 0  ? triamcinolone cream (KENALOG) 0.1 % Apply 1 application topically 2 (two) times daily. 30 g 0  ? albuterol (VENTOLIN HFA) 108 (90 Base) MCG/ACT inhaler Inhale 2 puffs into the lungs every 6 (six) hours as needed for wheezing or shortness of breath. 3 each 3  ? amLODipine (NORVASC) 10 MG tablet Take 1 tablet (10 mg total) by mouth daily. 90 tablet 1  ? cloNIDine (CATAPRES) 0.1 MG tablet TAKE 1 TABLET(0.1 MG) BY MOUTH TWICE DAILY 180 tablet 1  ? Vitamin D, Ergocalciferol, (DRISDOL) 1.25 MG (50000 UNIT) CAPS capsule Take 1 capsule (50,000 Units total) by mouth every 7 (seven) days. (Patient not taking: Reported on 11/04/2021) 12 capsule 1  ? ?No facility-administered medications prior to visit.  ? ? ?Allergies  ?Allergen Reactions  ? Crestor [Rosuvastatin Calcium] Anaphylaxis  ? Metformin And Related Diarrhea  ?  Nausea and diarrhea  ? Atorvastatin Other (See Comments)  ?  Neck stiffness  ? Codeine Itching  ? ? ?Review of Systems  ?Constitutional:  Negative for fever.  ?HENT:  Negative for congestion, ear pain, hearing loss, sinus pain and sore throat.   ?Eyes:  Negative for blurred vision and pain.  ?Respiratory:  Negative for cough, sputum production, shortness of breath and wheezing.   ?Cardiovascular:  Negative for chest pain and palpitations.  ?Gastrointestinal:  Negative for blood in stool, constipation, diarrhea, nausea and vomiting.  ?Genitourinary:  Negative for dysuria, frequency, hematuria and urgency.  ?Musculoskeletal:  Negative for back pain, falls and myalgias.  ?Neurological:  Negative for dizziness,  sensory change, loss of consciousness, weakness and headaches.  ?Endo/Heme/Allergies:  Positive for environmental allergies. Does not bruise/bleed easily.  ?Psychiatric/Behavioral:  Negative for depression and suicidal ideas. The patient is not nervous/anxious and does not have insomnia.   ? ?   ?Objective:  ?  ?Physical Exam ?Constitutional:   ?   General: She is not in acute distress. ?   Appearance: Normal appearance. She is not ill-appearing.  ?HENT:  ?   Head: Normocephalic and atraumatic.  ?   Right Ear: External  ear normal.  ?   Left Ear: External ear normal.  ?Eyes:  ?   Extraocular Movements: Extraocular movements intact.  ?   Pupils: Pupils are equal, round, and reactive to light.  ?Cardiovascular:  ?   Rate and Rhythm: Normal rate and regular rhythm.  ?   Pulses: Normal pulses.  ?   Heart sounds: Murmur heard.  ?Systolic murmur is present with a grade of 2/6.  ?  No gallop.  ?   Comments: 2/6 murmur at left sternal border  ?Pulmonary:  ?   Effort: Pulmonary effort is normal. No respiratory distress.  ?   Breath sounds: Normal breath sounds. No wheezing, rhonchi or rales.  ?Abdominal:  ?   General: Bowel sounds are normal. There is no distension.  ?   Palpations: Abdomen is soft. There is no mass.  ?   Tenderness: There is no abdominal tenderness. There is no guarding or rebound.  ?   Hernia: No hernia is present.  ?Musculoskeletal:  ?   Cervical back: Normal range of motion and neck supple.  ?Lymphadenopathy:  ?   Cervical: No cervical adenopathy.  ?Skin: ?   General: Skin is warm and dry.  ?Neurological:  ?   Mental Status: She is alert and oriented to person, place, and time.  ?Psychiatric:     ?   Behavior: Behavior normal.  ? ? ?BP 132/88 (BP Location: Left Arm, Patient Position: Sitting, Cuff Size: Large)   Pulse 71   Temp 98.5 ?F (36.9 ?C) (Oral)   Resp 18   Ht '5\' 3"'$  (1.6 m)   Wt 192 lb 6.4 oz (87.3 kg)   SpO2 97%   BMI 34.08 kg/m?  ?Wt Readings from Last 3 Encounters:  ?02/15/22 192 lb 6.4  oz (87.3 kg)  ?08/20/21 184 lb 9.6 oz (83.7 kg)  ?07/13/21 194 lb 9.6 oz (88.3 kg)  ? ? ?Diabetic Foot Exam - Simple   ?Simple Foot Form ?Diabetic Foot exam was performed with the following findings: Yes 02/21/2022  3

## 2022-02-15 NOTE — Patient Instructions (Signed)

## 2022-02-18 ENCOUNTER — Ambulatory Visit: Payer: Medicare HMO | Admitting: Family Medicine

## 2022-02-21 ENCOUNTER — Encounter: Payer: Self-pay | Admitting: Family Medicine

## 2022-02-21 DIAGNOSIS — J9601 Acute respiratory failure with hypoxia: Secondary | ICD-10-CM | POA: Insufficient documentation

## 2022-02-21 DIAGNOSIS — E2740 Unspecified adrenocortical insufficiency: Secondary | ICD-10-CM | POA: Insufficient documentation

## 2022-02-21 NOTE — Assessment & Plan Note (Signed)
Well controlled, no changes to meds. Encouraged heart healthy diet such as the DASH diet and exercise as tolerated.  °

## 2022-02-21 NOTE — Assessment & Plan Note (Signed)
Encourage heart healthy diet such as MIND or DASH diet, increase exercise, avoid trans fats, simple carbohydrates and processed foods, consider a krill or fish or flaxseed oil cap daily.  °

## 2022-02-21 NOTE — Assessment & Plan Note (Signed)
Per endo °

## 2022-02-23 ENCOUNTER — Other Ambulatory Visit: Payer: Self-pay

## 2022-02-23 MED ORDER — PRAVASTATIN SODIUM 40 MG PO TABS: 40.0000 mg | ORAL_TABLET | Freq: Every day | ORAL | 2 refills | Status: DC

## 2022-02-28 ENCOUNTER — Ambulatory Visit (HOSPITAL_BASED_OUTPATIENT_CLINIC_OR_DEPARTMENT_OTHER): Admission: RE | Admit: 2022-02-28 | Payer: Medicare Other | Source: Ambulatory Visit

## 2022-03-01 DIAGNOSIS — Z1152 Encounter for screening for COVID-19: Secondary | ICD-10-CM | POA: Diagnosis not present

## 2022-03-09 DIAGNOSIS — Z1152 Encounter for screening for COVID-19: Secondary | ICD-10-CM | POA: Diagnosis not present

## 2022-04-04 ENCOUNTER — Ambulatory Visit (INDEPENDENT_AMBULATORY_CARE_PROVIDER_SITE_OTHER): Payer: Medicare Other | Admitting: Internal Medicine

## 2022-04-04 ENCOUNTER — Encounter: Payer: Self-pay | Admitting: Internal Medicine

## 2022-04-04 VITALS — BP 138/82 | HR 60 | Ht 63.0 in | Wt 194.2 lb

## 2022-04-04 DIAGNOSIS — E1165 Type 2 diabetes mellitus with hyperglycemia: Secondary | ICD-10-CM

## 2022-04-04 LAB — POCT GLYCOSYLATED HEMOGLOBIN (HGB A1C): Hemoglobin A1C: 7.1 % — AB (ref 4.0–5.6)

## 2022-04-04 MED ORDER — INSULIN PEN NEEDLE 32G X 4 MM MISC: 1.0000 | Freq: Two times a day (BID) | 3 refills | Status: AC

## 2022-04-04 MED ORDER — TRULICITY 3 MG/0.5ML ~~LOC~~ SOAJ: 3.0000 mg | SUBCUTANEOUS | 3 refills | Status: DC

## 2022-04-04 MED ORDER — INSULIN LISPRO PROT & LISPRO (75-25 MIX) 100 UNIT/ML KWIKPEN: 25.0000 [IU] | PEN_INJECTOR | Freq: Two times a day (BID) | SUBCUTANEOUS | 3 refills | Status: DC

## 2022-04-04 NOTE — Progress Notes (Signed)
Name: Heather Walker  Age/ Sex: 61 y.o., female   MRN/ DOB: [id number], November 10, 1960     PCP: [name], DO   Reason for Endocrinology Evaluation: Type 2 Diabetes Mellitus  Initial Endocrine Consultative Visit: 09/26/2018    PATIENT IDENTIFIER: [name] is a 61 y.o. female with a past medical history of HTN, T2DM, GERD and Dyslipidemia . The patient has followed with Endocrinology clinic since 09/26/2018 for consultative assistance with management of her diabetes.  DIABETIC HISTORY:  Heather Walker was diagnosed with T2DM in 2015. She has reported GI intolerance to Metformin. She was unable to try Jardiance due to insurance issues. Wilder Glade gave her neck pain. Her hemoglobin A1c has ranged from 8.5%  in 2016, peaking at 9.8% in 2019.   On her initial visit to our clinic her A1c 9.8%. She was not taking any of her prescribed medications. We stopped Glipizide and started Novolog mix.  She has declined a CDE referral  Ozempic started 02/2020 but this was switched to Trulicity  02/1024    IN 03/2020 she presented to the ED with shoulder pain and chills, she had just received a steroid injection the prior day . CT abdomen was negative, MRI of shoulder showed adhesive capsulitis Due to dizziness and hypokalemia ( 2.7 mmol/L) , cortisol was checked and it was low at 1.9 ug/dL by 914AM , staff attempted to proceed with Cosyntropin stim. Test but the pt did not want to wait.  Of note her BP upon presentation was 163/116  mmhg  Repeat cortisol at our office was trending up but her ACTH was low and we started her on HC.   Pt eventually had a cosyntropin stim test with a 60 minute 9.7 ug/dL  She was started on [facility] in 04/2020 but took this for only a month   Cosyntropin stim test normal with a 60 minute cortisol at 27.8 ug/dL  ( 12/2020)  SUBJECTIVE:   During the last visit (12/30/2020) A1c 8.1 %. Continued Ozempic and  insulin mix     Today (04/04/2022): Heather Walker is here for  a follow up on diabetes management.  She has not been to our clinic in 13 months.  She checks her sugar daily , did not bring the meter today.     Denies nausea , vomiting or diarrhea   She did not take insulin today and drank a grape soda   She has achiness and numbness of her toes , worse at night , but it does not      HOME ENDOCRINE REGIMEN:  Humalog  Mix (70/30) 25 units BID  Trulicity 1.5 mg weekly      METER DOWNLOAD SUMMARY:  Did not bring     DIABETIC COMPLICATIONS: Microvascular complications:  Denies: retinopathy , neuropathy Last eye exam: Completed 04/2021   Macrovascular complications:  Denies: CAD, PVD, CVA      HISTORY:  Past Medical History:  Past Medical History:  Diagnosis Date   Arthritis    Asthma    Depression    Diabetes mellitus without complication ([place])    Heart murmur    Hypertension    Past Surgical History:  Past Surgical History:  Procedure Laterality Date   ABDOMINAL HYSTERECTOMY     APPENDECTOMY     BLADDER SUSPENSION     CESAREAN SECTION     3 previous   TEE WITHOUT CARDIOVERSION N/A 08/24/2017   Procedure: TRANSESOPHAGEAL ECHOCARDIOGRAM (TEE);  Surgeon:  Larey Dresser, MD;  Location[facility] END[facility]COPY;  Service: Cardiovascular;  Laterality: N/A;   TUBAL LIGATION     Social History:  reports that she has never smoked. She has never used smokeless tobacco. She reports that she does not drink alcohol and does not use drugs. Family History:  Family History  Problem Relation Age of Onset   Hypertension Mother    Asthma Mother    Hypertension Sister    Asthma Sister    Diabetes Sister    Hypertension Maternal Grandmother    Heart defect Sister    Asthma Sister    Aneurysm Sister    Asthma Sister      HOME MEDICATIONS: Allergies as of 04/04/2022       Reactions   Crestor [rosuvastatin Calcium] Anaphylaxis   Metformin And Related Diarrhea   Nausea and diarrhea   Atorvastatin Other (See Comments)   Neck stiffness    Codeine Itching        Medication List        Accurate as of April 04, 2022 12:22 PM. If you have any questions, ask your nurse or doctor.          STOP taking these medications    Trulicity 1.5 ZJ/6.7HA Sopn Generic drug: Dulaglutide Stopped by: Dorita Sciara, MD       TAKE these medications    albuterol 108 (90 Base) MCG/ACT inhaler Commonly known as: VENTOLIN HFA Inhale 2 puffs into the lungs every 6 (six) hours as needed for wheezing or shortness of breath.   amLODipine 10 MG tablet Commonly known as: NORVASC Take 1 tablet (10 mg total) by mouth daily.   cloNIDine 0.1 MG tablet Commonly known as: CATAPRES TAKE 1 TABLET(0.1 MG) BY MOUTH TWICE DAILY   cyclobenzaprine 5 MG tablet Commonly known as: FLEXERIL Take 1 tablet (5 mg total) by mouth 3 (three) times daily as needed for muscle spasms.   Insulin Lispro Prot & Lispro (75-25) 100 UNIT/ML Kwikpen Commonly known as: HumaLOG Mix 75/25 KwikPen Inject 25 Units into the skin 2 (two) times daily.   Insulin Pen Needle 32G X 4 MM Misc Use as instructed to inject insulin daily   OneTouch Delica Plus LPFXTK24O Misc Use as instructed to check blood sugar 2 times daily   OneTouch Verio test strip Generic drug: glucose blood Use as instructed to check blood sugar 2 times daily   pravastatin 40 MG tablet Commonly known as: PRAVACHOL Take 1 tablet (40 mg total) by mouth daily.   triamcinolone cream 0.1 % Commonly known as: KENALOG Apply 1 application topically 2 (two) times daily.   triamterene-hydrochlorothiazide 37.5-25 MG tablet Commonly known as: MAXZIDE-25 Take 1 tablet by mouth daily.   Vitamin D (Ergocalciferol) 1.25 MG (50000 UNIT) Caps capsule Commonly known as: DRISDOL Take 1 capsule (50,000 Units total) by mouth every 7 (seven) days.         OBJECTIVE:   Vital Signs: BP 138/82 (BP Location: Left Arm, Patient Position: Sitting, Cuff Size: Normal)   Pulse 60   Ht '5\' 3"'$  (1.6 m)    Wt 194 lb 3.2 oz (88.1 kg)   SpO2 98%   BMI 34.40 kg/m   Wt Readings from Last 3 Encounters:  04/04/22 194 lb 3.2 oz (88.1 kg)  02/15/22 192 lb 6.4 oz (87.3 kg)  08/20/21 184 lb 9.6 oz (83.7 kg)     Exam: General: Pt appears well and not in distress  Lungs: Clear with good BS bilat  Heart: RRR   Extremities: No pretibial edema.   Neuro: MS is good with appropriate affect, pt is alert and Ox3    DM foot exam: 04/04/2022 The skin of the feet is intact without sores or ulcerations. The pedal pulses are 2+ on right and 2+ on left. The sensation is intact to a screening 5.07, 10 gram monofilament bilaterally         DATA REVIEWED:  Lab Results  Component Value Date   HGBA1C 7.1 (A) 04/04/2022   HGBA1C 6.8 (H) 02/15/2022   HGBA1C 7.1 (H) 08/20/2021   Lab Results  Component Value Date   MICROALBUR 3.9 (H) 02/15/2022   LDLCALC 106 (H) 02/15/2022   CREATININE 0.95 02/15/2022   Lab Results  Component Value Date   MICRALBCREAT 12.0 02/15/2022     Lab Results  Component Value Date   CHOL 197 02/15/2022   HDL 70.50 02/15/2022   LDLCALC 106 (H) 02/15/2022   LDLDIRECT 115.0 08/02/2018   TRIG 102.0 02/15/2022   CHOLHDL 3 02/15/2022      ASSESSMENT / PLAN / RECOMMENDATIONS:   1) Type 2 Diabetes Mellitus, OPtimally Controlled , Without complications - Most recent A1c of  7.1 %. Goal A1c < 7.0 %.     -Her A1c has improved tremendously over the past 13 months. -A month ago her A1c was 6.8%, today it is slightly higher at 7.1% -Upon questioning of her insulin dose, the patient hesitated from confirming that she is on 25 units twice daily.  I have encouraged compliance and consistency, but I have recommended increasing her Trulicity to improve U6J -Patient advised to avoid sugar sweetened beverages as she drank a regular soda this morning, discussed no sugar added options as well as diet options   MEDICATIONS:  Increase Trulicity 3 mg weekly Continue Humalog mix  (70/30) 25 units BID before breakfast and supper   EDUCATION / INSTRUCTIONS: BG monitoring instructions: Patient is instructed to check her blood sugars 2 times a day, fasting and supper time. Call [place] [facility] if: BG persistently < 70  I reviewed the Rule of 15 for the treatment of hypoglycemia in detail with the patient. Literature supplied.   2) Diabetic complications:  Eye: Does not have known diabetic retinopathy.  Neuro/ Feet: Does not have known diabetic peripheral neuropathy .  Renal: Patient does not have known baseline CKD, but has a low GFR for the first time , will continue to monitor. She   is  on an ACEI/ARB at present.    F/U in 4 months    Signed electronically by: Mack Guise, MD  [facility] Endocrinology  [facility] [address] [place], [place] [address] Phone: [phone] FAX: [phone]   CC: [address] [place] [address] Phone: [phone]  Fax: [phone]  Return to Endocrinology clinic as below: Future Appointments  Date Time Provider Lee  04/05/2022  3:00 PM [place] [facility]  07/19/2022  1:00 PM [facility]  08/22/2022  2:00 PM [name], DO [facility]

## 2022-04-04 NOTE — Patient Instructions (Addendum)
 -   Continue Humalog  Mix  25 units with Breakfast and 25 units with Supper - Increase Trulicity 3 mg once weekly    HOW TO TREAT LOW BLOOD SUGARS (Blood sugar LESS THAN 70 MG/DL) Please follow the RULE OF 15 for the treatment of hypoglycemia treatment (when your (blood sugars are less than 70 mg/dL)   STEP 1: Take 15 grams of carbohydrates when your blood sugar is low, which includes:  3-4 GLUCOSE TABS  OR 3-4 OZ OF JUICE OR REGULAR SODA OR ONE TUBE OF GLUCOSE GEL    STEP 2: RECHECK blood sugar in 15 MINUTES STEP 3: If your blood sugar is still low at the 15 minute recheck --> then, go back to STEP 1 and treat AGAIN with another 15 grams of carbohydrates.     Apply Capsaicin cream as needed for the feet ( make sure you wash your hands well after use as its made of chili pepper)

## 2022-04-05 ENCOUNTER — Ambulatory Visit (HOSPITAL_COMMUNITY)
Admission: RE | Admit: 2022-04-05 | Discharge: 2022-04-05 | Disposition: A | Discharge status: Home / Self Care | Payer: Medicare Other | Source: Ambulatory Visit | Attending: Family Medicine | Admitting: Family Medicine

## 2022-04-05 DIAGNOSIS — R011 Cardiac murmur, unspecified: Secondary | ICD-10-CM | POA: Diagnosis not present

## 2022-04-05 DIAGNOSIS — E119 Type 2 diabetes mellitus without complications: Secondary | ICD-10-CM | POA: Diagnosis not present

## 2022-04-05 DIAGNOSIS — I071 Rheumatic tricuspid insufficiency: Secondary | ICD-10-CM | POA: Diagnosis not present

## 2022-04-05 DIAGNOSIS — I10 Essential (primary) hypertension: Secondary | ICD-10-CM | POA: Insufficient documentation

## 2022-04-05 DIAGNOSIS — E785 Hyperlipidemia, unspecified: Secondary | ICD-10-CM | POA: Insufficient documentation

## 2022-04-05 DIAGNOSIS — R42 Dizziness and giddiness: Secondary | ICD-10-CM | POA: Diagnosis not present

## 2022-04-05 LAB — ECHOCARDIOGRAM COMPLETE
Area-P 1/2: 3.44 cm2
S' Lateral: 2.1 cm

## 2022-04-18 ENCOUNTER — Ambulatory Visit (INDEPENDENT_AMBULATORY_CARE_PROVIDER_SITE_OTHER): Payer: Medicare Other | Admitting: Pharmacist

## 2022-04-18 DIAGNOSIS — E1169 Type 2 diabetes mellitus with other specified complication: Secondary | ICD-10-CM

## 2022-04-18 DIAGNOSIS — I10 Essential (primary) hypertension: Secondary | ICD-10-CM

## 2022-04-18 DIAGNOSIS — E1165 Type 2 diabetes mellitus with hyperglycemia: Secondary | ICD-10-CM

## 2022-04-18 MED ORDER — PRAVASTATIN SODIUM 40 MG PO TABS: 40.0000 mg | ORAL_TABLET | Freq: Every day | ORAL | 1 refills | Status: DC

## 2022-04-18 NOTE — Chronic Care Management (AMB) (Signed)
Chronic Care Management Pharmacy Note  04/18/2022 Name:  Heather Walker MRN:  [id number] DOB:  1960-11-24  Summary: Type 2 DM: A1c not quite at goal when checked by Dr Kelton Pillar 03/2022. Patient was to increase Trulicity from 1.$RemoveBeforeDE'5mg'iYAudBASWzKSihk$  weekly to $RemoveB'3mg'hXVybanw$  weekly but she has not received new dose from [employer] medication assistance program. [facility] and they need updated prescription. Filled out paperwork to update Rx at [facility] and faxed to Dr [facility] for review and signature. Discussed that Medicare guidelines has changed and that she might be able to qualify for Continuous Glucose Monitor if Dr Kelton Pillar approved. Patient declined Continuous Glucose Monitor at this time.  Hypertension: blood pressure was noted to be 130/90 in May 2023. DBP flagged as not at goal per [facility]. Blood pressure was better when she saw DR [facility] at 138/82. Recommended she continue to take amlodipine, clonidine and triamterene-hydrochlorothiazide. Limit in take of sodium in diet.  Hyperlipidemia: Started Pravachol in December 2022 but seemed to have stopping taking prior to last appointment with Dr Carollee Herter. Patient restarted after lipids rechecked in May 2023. Patient has filled once for 30 days so far. Discussed getting 90 day supply and patient would like to get 90 days. She will see Dr Carollee Herter November 2023 to recheck labs. Updated Rx sent to pharmacy for pravastatin.    Subjective: Heather Walker is an 61 y.o. year old female who is a primary patient of [name], DO.  The CCM team was consulted for assistance with disease management and care coordination needs.    Engaged with patient by telephone for follow up visit in response to provider referral for pharmacy case management and/or care coordination services.   Consent to Services:  The patient was given information about Chronic Care Management services, agreed to services, and gave verbal consent prior to  initiation of services.  Please see initial visit note for detailed documentation.   Patient Care Team: Carollee Herter, Alferd Apa, DO as PCP - General (Family Medicine) [name], RPH-CPP (Pharmacist) Shamleffer, Melanie Crazier, MD as Consulting Physician (Endocrinology) Webb Laws, OD as Referring Physician (Optometry)   Objective:  Lab Results  Component Value Date   CREATININE 0.95 02/15/2022   CREATININE 1.02 08/20/2021   CREATININE 0.75 07/16/2020    Lab Results  Component Value Date   HGBA1C 7.1 (A) 04/04/2022   Last diabetic Eye exam:  Lab Results  Component Value Date/Time   HMDIABEYEEXA No Retinopathy 04/15/2019 12:00 AM    Last diabetic Foot exam: No results found for: "HMDIABFOOTEX"      Component Value Date/Time   CHOL 197 02/15/2022 1334   TRIG 102.0 02/15/2022 1334   HDL 70.50 02/15/2022 1334   CHOLHDL 3 02/15/2022 1334   VLDL 20.4 02/15/2022 1334   LDLCALC 106 (H) 02/15/2022 1334   LDLCALC 89 07/16/2020 1320   LDLDIRECT 115.0 08/02/2018 1525       Latest Ref Rng & Units 02/15/2022    1:34 PM 08/20/2021   11:09 AM 07/16/2020    1:20 PM  Hepatic Function  Total Protein 6.0 - 8.3 g/dL 7.1  7.5  6.8   Albumin 3.5 - 5.2 g/dL 4.0  4.2    AST 0 - 37 U/L $Remo'13  17  12   'NSqpU$ ALT 0 - 35 U/L $Remo'10  14  8   'KKQRU$ Alk Phosphatase 39 - 117 U/L 107  110    Total Bilirubin 0.2 - 1.2 mg/dL 0.5  0.5  0.4     Lab Results  Component Value Date/Time   TSH 1.63 06/11/2020 03:02 PM   TSH 1.548 04/09/2020 12:11 PM   TSH 1.124 12/08/2014 03:29 PM       Latest Ref Rng & Units 08/20/2021   11:09 AM 06/11/2020    3:02 PM 04/16/2020    4:56 AM  CBC  WBC 4.0 - 10.5 K/uL 3.1  3.4  7.4   Hemoglobin 12.0 - 15.0 g/dL 13.5  12.7  13.4   Hematocrit 36.0 - 46.0 % 40.8  39.4  38.6   Platelets 150.0 - 400.0 K/uL 235.0  267  341     Lab Results  Component Value Date/Time   VD25OH 12 (L) 06/11/2020 03:02 PM    Clinical ASCVD: No  The 10-year ASCVD risk score (Arnett DK, et al.,  2019) is: 15.3%   Values used to calculate the score:     Age: 74 years     Sex: Female     Is Non-Hispanic African American: Yes     Diabetic: Yes     Tobacco smoker: No     Systolic Blood Pressure: 875 mmHg     Is BP treated: Yes     HDL Cholesterol: 70.5 mg/dL     Total Cholesterol: 197 mg/dL      Social History   Tobacco Use  Smoking Status Never  Smokeless Tobacco Never   BP Readings from Last 3 Encounters:  04/04/22 138/82  02/15/22 132/88  08/20/21 130/90   Pulse Readings from Last 3 Encounters:  04/04/22 60  02/15/22 71  08/20/21 87   Wt Readings from Last 3 Encounters:  04/04/22 194 lb 3.2 oz (88.1 kg)  02/15/22 192 lb 6.4 oz (87.3 kg)  08/20/21 184 lb 9.6 oz (83.7 kg)    Assessment: Review of patient past medical history, allergies, medications, health status, including review of consultants reports, laboratory and other test data, was performed as part of comprehensive evaluation and provision of chronic care management services.   SDOH:  (Social Determinants of Health) assessments and interventions performed:  SDOH Interventions    Flowsheet Row Most Recent Value  SDOH Interventions   Financial Strain Interventions Other (Comment)  [getting form MAP for 2023.]       CCM Care Plan  Allergies  Allergen Reactions   Crestor [Rosuvastatin Calcium] Anaphylaxis   Metformin And Related Diarrhea    Nausea and diarrhea   Atorvastatin Other (See Comments)    Neck stiffness   Codeine Itching    Medications Reviewed Today     Reviewed by [name], RPH-CPP (Pharmacist) on 04/18/22 at 1142  Med List Status: <None>   Medication Order Taking? Sig Documenting Provider Last Dose Status Informant  albuterol (VENTOLIN HFA) 108 (90 Base) MCG/ACT inhaler [id number] Yes Inhale 2 puffs into the lungs every 6 (six) hours as needed for wheezing or shortness of breath. Roma Schanz R, DO Taking Active   amLODipine (NORVASC) 10 MG tablet [id number] Yes Take  1 tablet (10 mg total) by mouth daily. Roma Schanz R, DO Taking Active   cloNIDine (CATAPRES) 0.1 MG tablet [id number] Yes TAKE 1 TABLET(0.1 MG) BY MOUTH TWICE DAILY Carollee Herter, Alferd Apa, DO Taking Active   cyclobenzaprine (FLEXERIL) 5 MG tablet [id number] Yes Take 1 tablet (5 mg total) by mouth 3 (three) times daily as needed for muscle spasms. [name], DO Taking Active   Dulaglutide (TRULICITY) 3 KY/7.0WC SOPN [id number] No Inject  3 mg as directed once a week.  Patient not taking: Reported on 04/18/2022   Shamleffer, Melanie Crazier, MD Not Taking Active            Med Note Barbaraann Boys Apr 18, 2022 11:42 AM) Still taking 1.$RemoveBefore'5mg'WlLPWwKIswRNo$  strength. Has not received $RemoveBefo'3mg'BuIMQCEvOOH$  from [facility].   Insulin Lispro Prot & Lispro (HUMALOG MIX 75/25 KWIKPEN) (75-25) 100 UNIT/ML Claiborne Rigg [id number] Yes Inject 25 Units into the skin 2 (two) times daily. Shamleffer, Melanie Crazier, MD Taking Active   Insulin Pen Needle 32G X 4 MM MISC [id number] Yes Inject 1 Device into the skin in the morning and at bedtime. Use as instructed to inject insulin daily Shamleffer, Melanie Crazier, MD Taking Active   Lancets (ONETOUCH DELICA PLUS KWIOXB35H) Ranger [id number] Yes Use as instructed to check blood sugar 2 times daily Shamleffer, Melanie Crazier, MD Taking Active   [facility] VERIO test strip [id number] Yes Use as instructed to check blood sugar 2 times daily Shamleffer, Melanie Crazier, MD Taking Active   pravastatin (PRAVACHOL) 40 MG tablet [id number] Yes Take 1 tablet (40 mg total) by mouth daily. Roma Schanz R, DO Taking Active   triamcinolone cream (KENALOG) 0.1 % [id number] Yes Apply 1 application topically 2 (two) times daily. Roma Schanz R, DO Taking Active Other  triamterene-hydrochlorothiazide (MAXZIDE-25) 37.5-25 MG tablet [id number] Yes Take 1 tablet by mouth daily. [name], DO Taking Active   Vitamin D, Ergocalciferol, (DRISDOL) 1.25 MG (50000 UNIT) CAPS capsule [id number] Yes  Take 1 capsule (50,000 Units total) by mouth every 7 (seven) days. [name], DO Taking Active             Patient Active Problem List   Diagnosis Date Noted   Unspecified adrenocortical insufficiency ([place]) 02/21/2022   Type 2 diabetes mellitus with diabetic peripheral angiopathy without gangrene, without long-term current use of insulin ([place]) 08/20/2021   Hyperlipidemia 07/16/2020   Dizziness 04/15/2020   BMI 35.0-35.9,adult 04/15/2020   Weakness 04/15/2020   Abnormal cortisol level 04/14/2020   SIRS (systemic inflammatory response syndrome) ([place]) 04/06/2020   Pneumonia due to COVID-19 virus 05/14/2019   COVID-19 virus infection 05/13/2019   Renal insufficiency 03/26/2019   At risk for cardiovascular event 03/26/2019   Allergic reaction due to correct medicinal substance properly administered 08/23/2018   Hypertriglyceridemia 08/05/2018   Asthma, chronic, unspecified asthma severity, uncomplicated 02/77/4128   Chronic right shoulder pain 08/05/2018   Muscle spasm 08/05/2018   Insomnia 10/05/2017   Sepsis ([place]) 08/21/2017   Acute right hip pain 08/21/2017   Preventative health care 11/20/2016   Right ankle pain 05/09/2016   Palpitations 12/08/2014   DM (diabetes mellitus) type II uncontrolled, periph vascular disorder 08/03/2014   Leg wound, right 08/03/2014   Hyperglycemia 07/13/2014   Chest pain 07/12/2014   Diabetes mellitus ([place]) 07/12/2014   Acute kidney injury ([place]) 07/12/2014   Abdominal pain, right upper quadrant 05/28/2014   Tachycardia 05/03/2013   Abscess 11/13/2012   Breast pain, right 08/10/2012   Osteoarthritis of left shoulder 03/04/2011   BACK PAIN, LUMBAR, WITH RADICULOPATHY 09/17/2009   CALF PAIN, RIGHT 09/17/2009   RENAL CYST 08/28/2009   LOW BACK PAIN, ACUTE 11/13/2008   FIBROIDS, UTERUS 09/10/2007   UTI 09/03/2007   BACTERIAL VAGINITIS 09/03/2007   Flank pain 09/03/2007   Hypokalemia 09/03/2007   MOLE 06/21/2007   ALLERGIC  RHINITIS 06/21/2007   HEMOCCULT POSITIVE STOOL 06/21/2007   SINUSITIS, ACUTE NOS 04/05/2007  REFLUX, ESOPHAGEAL 04/05/2007   CHEST PAIN 04/05/2007   Essential hypertension 03/19/2007   ALLERGIC RHINITIS, SEASONAL 03/09/2007   Asthma 03/09/2007    Immunization History  Administered Date(s) Administered   Influenza,inj,Quad PF,6+ Mos 07/25/2014, 10/05/2015, 10/05/2017   PFIZER(Purple Top)SARS-COV-2 Vaccination 01/09/2020, 02/03/2020   Pneumococcal Polysaccharide-23 07/15/2014   Td 04/12/2006   Tdap 02/24/2011    Conditions to be addressed/monitored: HTN, HLD, and DMII  There are no care plans that you recently modified to display for this patient.    Medication Assistance:  Humalog 29/57 and Trulicity obtained through [facility] PAP medication assistance program.  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   Patient's preferred pharmacy is:  [facility] [phone] - [place], [place] - [address] [place] [address] Phone: [phone] Fax: [phone]  Follow Up:  Patient agrees to Care Plan and Follow-up.  Plan: Telephone follow up appointment with care management team member scheduled for:  Feb 2023 with clinical pharmacist  [name], PharmD Clinical Pharmacist [place]

## 2022-04-18 NOTE — Patient Instructions (Signed)
Heather Walker It was a pleasure speaking with you  Below is a summary of your health goals. You can view your updated care plan on mychart   Patient Goals/Self-Care Activities take medications as prescribed,  check glucose daily, document, and provide at future appointments, and  check blood pressure 2 to 3 times per week, document, and provide at future appointments Contact office if you have not received Trulicity '3mg'$  in the next 2 weeks. [phone] [name], Clinical Pharmacist Practitioner)   If you have any questions or concerns, please feel free to contact me either at the phone number below or with a MyChart message.   Keep up the good work!  [name], PharmD Clinical Pharmacist [place] [phone] (direct line)  [phone] (main office number)   Patient verbalizes understanding of instructions and care plan provided today and agrees to view in [place]. Active MyChart status and patient understanding of how to access instructions and care plan via MyChart confirmed with patient.

## 2022-04-29 ENCOUNTER — Other Ambulatory Visit: Payer: Self-pay

## 2022-04-29 MED ORDER — ONETOUCH VERIO W/DEVICE KIT: PACK | 0 refills | Status: DC

## 2022-05-06 ENCOUNTER — Other Ambulatory Visit: Payer: Self-pay

## 2022-05-06 DIAGNOSIS — M25511 Pain in right shoulder: Secondary | ICD-10-CM

## 2022-05-06 MED ORDER — CYCLOBENZAPRINE HCL 5 MG PO TABS: 5.0000 mg | ORAL_TABLET | Freq: Three times a day (TID) | ORAL | 1 refills | Status: DC | PRN

## 2022-05-16 DIAGNOSIS — I10 Essential (primary) hypertension: Secondary | ICD-10-CM | POA: Diagnosis not present

## 2022-05-16 DIAGNOSIS — E1169 Type 2 diabetes mellitus with other specified complication: Secondary | ICD-10-CM | POA: Diagnosis not present

## 2022-05-16 DIAGNOSIS — Z794 Long term (current) use of insulin: Secondary | ICD-10-CM

## 2022-05-16 DIAGNOSIS — E785 Hyperlipidemia, unspecified: Secondary | ICD-10-CM

## 2022-05-16 DIAGNOSIS — E1165 Type 2 diabetes mellitus with hyperglycemia: Secondary | ICD-10-CM | POA: Diagnosis not present

## 2022-05-19 LAB — HM DIABETES EYE EXAM

## 2022-07-11 ENCOUNTER — Encounter: Payer: Self-pay | Admitting: Pharmacist

## 2022-07-11 ENCOUNTER — Other Ambulatory Visit: Payer: Self-pay | Admitting: Internal Medicine

## 2022-07-11 ENCOUNTER — Ambulatory Visit: Payer: Medicare Other | Admitting: Pharmacist

## 2022-07-11 DIAGNOSIS — E1165 Type 2 diabetes mellitus with hyperglycemia: Secondary | ICD-10-CM

## 2022-07-11 MED ORDER — ONETOUCH VERIO VI STRP: ORAL_STRIP | 2 refills | Status: DC

## 2022-07-11 NOTE — Patient Instructions (Signed)
Heather Walker It was a pleasure speaking with you today.  Below is a summary of our recent visit.    1 - Start vitamin D 2000 units once a day - you can purchase at your pharmacy either capsules that are 2000 units or 1000 units (take 2).  2 - call [facility] to schedule colonoscopy [phone] 3 - get vaccinated  Annual flu vaccine due now  Updated COVID vaccine is not available   Due to get Tetanus, Diptheria and Pertussis updated - usually due every 10 years. Consider getting Shingrix - to prevent shingles.    As always if you have any questions or concerns especially regarding medications, please feel free to contact me either at the phone number below or with a MyChart message.   Keep up the good work!  [name], PharmD Clinical Pharmacist [place] [phone] (direct line)  [phone] (main office number)   Patient verbalizes understanding of instructions and care plan provided today and agrees to view in [place]. Active MyChart status and patient understanding of how to access instructions and care plan via MyChart confirmed with patient.

## 2022-07-11 NOTE — Progress Notes (Signed)
Pharmacy Note  07/11/2022 Name: Heather Walker MRN: [id number] DOB: 30-Dec-1960  Subjective: Heather Walker is a 61 y.o. year old female who is a primary care patient of Carollee Herter, Alferd Apa, DO. Clinical Pharmacist Practitioner referral was placed to assist with medication management.    Engaged with patient by telephone for follow up visit today.  Type 2 DM: A1c not quite at goal when checked by Dr Kelton Pillar 03/2022. Patient has increased Trulicity from 1.'5mg'$  weekly to '3mg'$  weekly and endorses that she has received delivery of '3mg'$  dose from [place] to take Humalog Mix 75/25 - 25 units twice a day. Denies hypoglycemia. Reports home blood glucose has been 120-130.  Hypertension: blood pressure was noted to be 130/90 in May 2023. DBP flagged as not at goal per [facility]. Blood pressure was better when she saw DR [facility] at 138/82. Recommended she continue to take amlodipine, clonidine and triamterene-hydrochlorothiazide. Continue to limit intake of sodium in diet.   Hyperlipidemia: patient report she is taking pravastatin daily. Filled 90 days 04/18/2022.  She will see Dr Carollee Herter November 2023 to recheck labs. Updated Rx sent to pharmacy for pravastatin.   Low vitamin D and B12: Last checked 05/2020 - B12 was low normal at 264. Took injections for awhile. Vitamin D was 12. Prescribed weekly vitamin D in past but patient never started. She was unsure why.   Objective: Review of patient status, including review of consultants reports, laboratory and other test data, was performed as part of comprehensive evaluation and provision of chronic care management services.   Lab Results  Component Value Date   CREATININE 0.95 02/15/2022   CREATININE 1.02 08/20/2021   CREATININE 0.75 07/16/2020    Lab Results  Component Value Date   HGBA1C 7.1 (A) 04/04/2022       Component Value Date/Time   CHOL 197 02/15/2022 1334   TRIG 102.0 02/15/2022 1334   HDL  70.50 02/15/2022 1334   CHOLHDL 3 02/15/2022 1334   VLDL 20.4 02/15/2022 1334   LDLCALC 106 (H) 02/15/2022 1334   LDLCALC 89 07/16/2020 1320   LDLDIRECT 115.0 08/02/2018 1525     Clinical ASCVD: Yes  The 10-year ASCVD risk score (Arnett DK, et al., 2019) is: 15.3%   Values used to calculate the score:     Age: 40 years     Sex: Female     Is Non-Hispanic African American: Yes     Diabetic: Yes     Tobacco smoker: No     Systolic Blood Pressure: 937 mmHg     Is BP treated: Yes     HDL Cholesterol: 70.5 mg/dL     Total Cholesterol: 197 mg/dL    BP Readings from Last 3 Encounters:  04/04/22 138/82  02/15/22 132/88  08/20/21 130/90     Allergies  Allergen Reactions   Crestor [Rosuvastatin Calcium] Anaphylaxis   Metformin And Related Diarrhea    Nausea and diarrhea   Atorvastatin Other (See Comments)    Neck stiffness   Codeine Itching    Medications Reviewed Today     Reviewed by [name], RPH-CPP (Pharmacist) on 04/18/22 at 1142  Med List Status: <None>   Medication Order Taking? Sig Documenting Provider Last Dose Status Informant  albuterol (VENTOLIN HFA) 108 (90 Base) MCG/ACT inhaler [id number] Yes Inhale 2 puffs into the lungs every 6 (six) hours as needed for wheezing or shortness of breath. [name], DO Taking Active  amLODipine (NORVASC) 10 MG tablet [id number] Yes Take 1 tablet (10 mg total) by mouth daily. Roma Schanz R, DO Taking Active   cloNIDine (CATAPRES) 0.1 MG tablet [id number] Yes TAKE 1 TABLET(0.1 MG) BY MOUTH TWICE DAILY Carollee Herter, Alferd Apa, DO Taking Active   cyclobenzaprine (FLEXERIL) 5 MG tablet [id number] Yes Take 1 tablet (5 mg total) by mouth 3 (three) times daily as needed for muscle spasms. Roma Schanz R, DO Taking Active   Dulaglutide (TRULICITY) 3 YQ/6.5HQ SOPN [id number] No Inject 3 mg as directed once a week.  Patient not taking: Reported on 04/18/2022   Shamleffer, Melanie Crazier, MD Not Taking Active             Med Note Barbaraann Boys Apr 18, 2022 11:42 AM) Still taking 1.'5mg'$  strength. Has not received '3mg'$  from [facility].   Insulin Lispro Prot & Lispro (HUMALOG MIX 75/25 KWIKPEN) (75-25) 100 UNIT/ML Claiborne Rigg [id number] Yes Inject 25 Units into the skin 2 (two) times daily. Shamleffer, Melanie Crazier, MD Taking Active   Insulin Pen Needle 32G X 4 MM MISC [id number] Yes Inject 1 Device into the skin in the morning and at bedtime. Use as instructed to inject insulin daily Shamleffer, Melanie Crazier, MD Taking Active   Lancets (ONETOUCH DELICA PLUS [id number]) [place] [id number] Yes Use as instructed to check blood sugar 2 times daily Shamleffer, Melanie Crazier, MD Taking Active   [facility] VERIO test strip [id number] Yes Use as instructed to check blood sugar 2 times daily Shamleffer, Melanie Crazier, MD Taking Active   pravastatin (PRAVACHOL) 40 MG tablet [id number] Yes Take 1 tablet (40 mg total) by mouth daily. Roma Schanz R, DO Taking Active   triamcinolone cream (KENALOG) 0.1 % [id number] Yes Apply 1 application topically 2 (two) times daily. Roma Schanz R, DO Taking Active Other  triamterene-hydrochlorothiazide (MAXZIDE-25) 37.5-25 MG tablet [id number] Yes Take 1 tablet by mouth daily. [name], DO Taking Active   Vitamin D, Ergocalciferol, (DRISDOL) 1.25 MG (50000 UNIT) CAPS capsule [id number] Yes Take 1 capsule (50,000 Units total) by mouth every 7 (seven) days. [name], DO Taking Active             Patient Active Problem List   Diagnosis Date Noted   Unspecified adrenocortical insufficiency ([place]) 02/21/2022   Type 2 diabetes mellitus with diabetic peripheral angiopathy without gangrene, without long-term current use of insulin ([place]) 08/20/2021   Hyperlipidemia 07/16/2020   Dizziness 04/15/2020   BMI 35.0-35.9,adult 04/15/2020   Weakness 04/15/2020   Abnormal cortisol level 04/14/2020   SIRS (systemic inflammatory response syndrome) ([place])  04/06/2020   Pneumonia due to COVID-19 virus 05/14/2019   COVID-19 virus infection 05/13/2019   Renal insufficiency 03/26/2019   At risk for cardiovascular event 03/26/2019   Allergic reaction due to correct medicinal substance properly administered 08/23/2018   Hypertriglyceridemia 08/05/2018   Asthma, chronic, unspecified asthma severity, uncomplicated 51/76/1607   Chronic right shoulder pain 08/05/2018   Muscle spasm 08/05/2018   Insomnia 10/05/2017   Sepsis ([place]) 08/21/2017   Acute right hip pain 08/21/2017   Preventative health care 11/20/2016   Right ankle pain 05/09/2016   Palpitations 12/08/2014   DM (diabetes mellitus) type II uncontrolled, periph vascular disorder 08/03/2014   Leg wound, right 08/03/2014   Hyperglycemia 07/13/2014   Chest pain 07/12/2014   Diabetes mellitus ([place]) 07/12/2014   Acute kidney injury ([place]) 07/12/2014   Abdominal pain, right upper quadrant 05/28/2014  Tachycardia 05/03/2013   Abscess 11/13/2012   Breast pain, right 08/10/2012   Osteoarthritis of left shoulder 03/04/2011   BACK PAIN, LUMBAR, WITH RADICULOPATHY 09/17/2009   CALF PAIN, RIGHT 09/17/2009   RENAL CYST 08/28/2009   LOW BACK PAIN, ACUTE 11/13/2008   FIBROIDS, UTERUS 09/10/2007   UTI 09/03/2007   BACTERIAL VAGINITIS 09/03/2007   Flank pain 09/03/2007   Hypokalemia 09/03/2007   MOLE 06/21/2007   ALLERGIC RHINITIS 06/21/2007   HEMOCCULT POSITIVE STOOL 06/21/2007   SINUSITIS, ACUTE NOS 04/05/2007   REFLUX, ESOPHAGEAL 04/05/2007   CHEST PAIN 04/05/2007   Essential hypertension 03/19/2007   ALLERGIC RHINITIS, SEASONAL 03/09/2007   Asthma 03/09/2007     Medication Assistance:  None required.  Patient affirms current coverage meets needs.   Assessment:   Type 2 DM - almost at goal Hypertension - last office blood pressure was acceptable Hyperlipidemia- LDL not at goal when last checked but at the time patient was not compliant with statin Low vitamin D Health maintenance  reviewed Medication management.   Plan: Updated prescription for [employer] verio test strips - continue to check blood glucose twice a day. Reviewed blood glucose goals.  Fasting blood glucose goal (before meals) = 80 to 130 Blood glucose goal after a meal = less than 180  Continue as planned to see Dr Kelton Pillar in December. Continue current dose of Trulicity and Humalog Mix Coordinated refills for needed maintenance medications - trimaterene/ hydrochlorothiazide, pravastatin and clonidine.  Recommended patient start vitamin D 2000 units daily. Recheck vitamin D at next PCP visit Continue B12 1000 mcg daily - recheck B12 at next PCP visit.  Provided number to patient for [facility] to make appt for colonoscopy. Also sent MyChart message with info Discussed vaccinations - recommended annual flu vaccine, updated COVID vaccine, Tdap and Shingrix vaccines.    Follow Up:  Telephone follow up appointment with care management team member scheduled for:  2 to 3 months.    [name], PharmD Clinical Pharmacist [place] [phone]

## 2022-07-19 ENCOUNTER — Ambulatory Visit: Payer: Medicare PPO

## 2022-08-22 ENCOUNTER — Encounter: Payer: Medicare Other | Admitting: Family Medicine

## 2022-09-27 ENCOUNTER — Other Ambulatory Visit: Payer: Self-pay | Admitting: Family Medicine

## 2022-09-27 DIAGNOSIS — Z1231 Encounter for screening mammogram for malignant neoplasm of breast: Secondary | ICD-10-CM

## 2022-09-28 ENCOUNTER — Ambulatory Visit: Payer: Medicare Other | Admitting: Internal Medicine

## 2022-09-30 ENCOUNTER — Telehealth: Payer: Self-pay | Admitting: *Deleted

## 2022-09-30 NOTE — Telephone Encounter (Signed)
LMOM for pt to schedule AWV. 

## 2022-10-06 ENCOUNTER — Emergency Department (HOSPITAL_COMMUNITY): Payer: Medicare Other

## 2022-10-06 ENCOUNTER — Emergency Department (HOSPITAL_COMMUNITY)
Admission: EM | Admit: 2022-10-06 | Discharge: 2022-10-07 | Disposition: A | Discharge status: Home / Self Care | Payer: Medicare Other | Attending: Emergency Medicine | Admitting: Emergency Medicine

## 2022-10-06 DIAGNOSIS — Z794 Long term (current) use of insulin: Secondary | ICD-10-CM | POA: Insufficient documentation

## 2022-10-06 DIAGNOSIS — R6889 Other general symptoms and signs: Secondary | ICD-10-CM | POA: Diagnosis not present

## 2022-10-06 DIAGNOSIS — E876 Hypokalemia: Secondary | ICD-10-CM | POA: Insufficient documentation

## 2022-10-06 DIAGNOSIS — I10 Essential (primary) hypertension: Secondary | ICD-10-CM | POA: Diagnosis not present

## 2022-10-06 DIAGNOSIS — Z20822 Contact with and (suspected) exposure to covid-19: Secondary | ICD-10-CM | POA: Insufficient documentation

## 2022-10-06 DIAGNOSIS — E119 Type 2 diabetes mellitus without complications: Secondary | ICD-10-CM | POA: Insufficient documentation

## 2022-10-06 DIAGNOSIS — R000 Tachycardia, unspecified: Secondary | ICD-10-CM | POA: Insufficient documentation

## 2022-10-06 DIAGNOSIS — Z79899 Other long term (current) drug therapy: Secondary | ICD-10-CM | POA: Insufficient documentation

## 2022-10-06 DIAGNOSIS — I499 Cardiac arrhythmia, unspecified: Secondary | ICD-10-CM | POA: Diagnosis not present

## 2022-10-06 DIAGNOSIS — T502X5A Adverse effect of carbonic-anhydrase inhibitors, benzothiadiazides and other diuretics, initial encounter: Secondary | ICD-10-CM | POA: Diagnosis not present

## 2022-10-06 DIAGNOSIS — J9801 Acute bronchospasm: Secondary | ICD-10-CM | POA: Insufficient documentation

## 2022-10-06 DIAGNOSIS — J101 Influenza due to other identified influenza virus with other respiratory manifestations: Secondary | ICD-10-CM | POA: Insufficient documentation

## 2022-10-06 DIAGNOSIS — J4520 Mild intermittent asthma, uncomplicated: Secondary | ICD-10-CM

## 2022-10-06 DIAGNOSIS — R079 Chest pain, unspecified: Secondary | ICD-10-CM | POA: Diagnosis not present

## 2022-10-06 DIAGNOSIS — Z743 Need for continuous supervision: Secondary | ICD-10-CM | POA: Diagnosis not present

## 2022-10-06 DIAGNOSIS — R0789 Other chest pain: Secondary | ICD-10-CM | POA: Diagnosis present

## 2022-10-06 DIAGNOSIS — R062 Wheezing: Secondary | ICD-10-CM | POA: Diagnosis not present

## 2022-10-06 LAB — CBC
HCT: 39.2 % (ref 36.0–46.0)
Hemoglobin: 13 g/dL (ref 12.0–15.0)
MCH: 27.5 pg (ref 26.0–34.0)
MCHC: 33.2 g/dL (ref 30.0–36.0)
MCV: 83.1 fL (ref 80.0–100.0)
Platelets: 212 10*3/uL (ref 150–400)
RBC: 4.72 MIL/uL (ref 3.87–5.11)
RDW: 15 % (ref 11.5–15.5)
WBC: 5.7 10*3/uL (ref 4.0–10.5)
nRBC: 0 % (ref 0.0–0.2)

## 2022-10-06 LAB — BASIC METABOLIC PANEL
Anion gap: 9 (ref 5–15)
BUN: 6 mg/dL (ref 6–20)
CO2: 28 mmol/L (ref 22–32)
Calcium: 8.7 mg/dL — ABNORMAL LOW (ref 8.9–10.3)
Chloride: 105 mmol/L (ref 98–111)
Creatinine, Ser: 0.9 mg/dL (ref 0.44–1.00)
GFR, Estimated: >60 mL/min (ref >60)
Glucose, Bld: 121 mg/dL — ABNORMAL HIGH (ref 70–99)
Potassium: 2.8 mmol/L — ABNORMAL LOW (ref 3.5–5.1)
Sodium: 142 mmol/L (ref 135–145)

## 2022-10-06 LAB — TROPONIN I (HIGH SENSITIVITY)
Troponin I (High Sensitivity): 5 ng/L (ref <18)
Troponin I (High Sensitivity): 6 ng/L (ref <18)

## 2022-10-06 MED ORDER — ACETAMINOPHEN 500 MG PO TABS
1000.0000 mg | ORAL_TABLET | Freq: Once | ORAL | Status: AC
  Administered 2022-10-06: 1000 mg via ORAL
  Filled 2022-10-06: qty 2

## 2022-10-06 NOTE — ED Provider Triage Note (Signed)
Emergency Medicine Provider Triage Evaluation Note  Heather Walker , a 61 y.o. female  was evaluated in triage.  Pt complains of chest pain and shortness of breath.  Patient states symptoms began last night.  She describes substernal pain rated 8 out of 10 in severity described as tightness.  She describes shortness of breath that began at the same time.  She also complains of bodyaches but states she had a negative COVID test earlier today.  Denies fevers, abdominal pain, nausea, vomiting  Review of Systems  Positive: As above Negative: As above  Physical Exam  BP (!) 163/110   Pulse (!) 125   Temp 99.5 F (37.5 C) (Oral)   Resp (!) 22   SpO2 96%  Gen:   Awake, no distress   Resp:  Normal effort  MSK:   Moves extremities without difficulty  Other:    Medical Decision Making  Medically screening exam initiated at 4:37 PM.  Appropriate orders placed.  Heather Walker was informed that the remainder of the evaluation will be completed by another provider, this initial triage assessment does not replace that evaluation, and the importance of remaining in the ED until their evaluation is complete.     Dorothyann Peng, PA-C 10/06/22 1649

## 2022-10-06 NOTE — ED Triage Notes (Signed)
Patient BIB [facility] from home for evaluation of chest pain and shortness of breath. History of asthma, received 1x albuterol treatment from fire department prior to transport. Patient reports cough for the last few weeks. EMS reports rhonchi in lower lobes. Patient is alert, oriented, and in no apparent distress at this time. Patient received '324mg'$  ASA and 2x SL NTG with improvement of chest pain.  BP 230/124 Patient is on clonidine plus other medications but patient stopped taking other antihypertensives approximately six months ago HR 128 CBG 129 RR 26 98% on room air

## 2022-10-07 ENCOUNTER — Other Ambulatory Visit: Payer: Self-pay | Admitting: Family Medicine

## 2022-10-07 DIAGNOSIS — I10 Essential (primary) hypertension: Secondary | ICD-10-CM

## 2022-10-07 LAB — RESP PANEL BY RT-PCR (RSV, FLU A&B, COVID)  RVPGX2
Influenza A by PCR: POSITIVE — AB
Influenza B by PCR: NEGATIVE
Resp Syncytial Virus by PCR: NEGATIVE
SARS Coronavirus 2 by RT PCR: NEGATIVE

## 2022-10-07 LAB — MAGNESIUM: Magnesium: 1.5 mg/dL — ABNORMAL LOW (ref 1.7–2.4)

## 2022-10-07 MED ORDER — POTASSIUM CHLORIDE CRYS ER 20 MEQ PO TBCR
40.0000 meq | EXTENDED_RELEASE_TABLET | Freq: Once | ORAL | Status: AC
  Administered 2022-10-07: 40 meq via ORAL
  Filled 2022-10-07: qty 2

## 2022-10-07 MED ORDER — PREDNISONE 50 MG PO TABS: 50.0000 mg | ORAL_TABLET | Freq: Every day | ORAL | 0 refills | Status: DC

## 2022-10-07 MED ORDER — PREDNISONE 20 MG PO TABS
60.0000 mg | ORAL_TABLET | Freq: Once | ORAL | Status: AC
  Administered 2022-10-07: 60 mg via ORAL
  Filled 2022-10-07: qty 3

## 2022-10-07 MED ORDER — MAGNESIUM SULFATE 2 GM/50ML IV SOLN
2.0000 g | Freq: Once | INTRAVENOUS | Status: AC
  Administered 2022-10-07: 2 g via INTRAVENOUS
  Filled 2022-10-07: qty 50

## 2022-10-07 MED ORDER — ACETAMINOPHEN 325 MG PO TABS
650.0000 mg | ORAL_TABLET | Freq: Once | ORAL | Status: AC
  Administered 2022-10-07: 650 mg via ORAL
  Filled 2022-10-07: qty 2

## 2022-10-07 MED ORDER — ALBUTEROL SULFATE HFA 108 (90 BASE) MCG/ACT IN AERS: 2.0000 | INHALATION_SPRAY | RESPIRATORY_TRACT | 0 refills | Status: AC | PRN

## 2022-10-07 MED ORDER — IPRATROPIUM-ALBUTEROL 0.5-2.5 (3) MG/3ML IN SOLN
3.0000 mL | Freq: Once | RESPIRATORY_TRACT | Status: AC
  Administered 2022-10-07: 3 mL via RESPIRATORY_TRACT
  Filled 2022-10-07: qty 3

## 2022-10-07 MED ORDER — OSELTAMIVIR PHOSPHATE 75 MG PO CAPS: 75.0000 mg | ORAL_CAPSULE | Freq: Two times a day (BID) | ORAL | 0 refills | Status: DC

## 2022-10-07 MED ORDER — POTASSIUM CHLORIDE 10 MEQ/100ML IV SOLN
10.0000 meq | INTRAVENOUS | Status: AC
  Administered 2022-10-07 (×2): 10 meq via INTRAVENOUS
  Filled 2022-10-07 (×2): qty 100

## 2022-10-07 MED ORDER — MAGNESIUM 30 MG PO TABS: 30.0000 mg | ORAL_TABLET | Freq: Every day | ORAL | 0 refills | Status: AC

## 2022-10-07 MED ORDER — OSELTAMIVIR PHOSPHATE 75 MG PO CAPS
75.0000 mg | ORAL_CAPSULE | Freq: Once | ORAL | Status: AC
  Administered 2022-10-07: 75 mg via ORAL
  Filled 2022-10-07: qty 1

## 2022-10-07 MED ORDER — KETOROLAC TROMETHAMINE 30 MG/ML IJ SOLN
30.0000 mg | Freq: Once | INTRAMUSCULAR | Status: AC
  Administered 2022-10-07: 30 mg via INTRAVENOUS
  Filled 2022-10-07: qty 1

## 2022-10-07 MED ORDER — POTASSIUM CHLORIDE CRYS ER 20 MEQ PO TBCR: 20.0000 meq | EXTENDED_RELEASE_TABLET | Freq: Every day | ORAL | 0 refills | Status: DC

## 2022-10-07 NOTE — ED Provider Notes (Signed)
[facility] EMERGENCY DEPARTMENT Provider Note   CSN: [id number] Arrival date & time: 10/06/22  1621     History  Chief Complaint  Patient presents with   Chest Pain    Heather Walker is a 61 y.o. female.  The history is provided by the patient.  Chest Pain She has history of hypertension, diabetes and comes in complaining of subjective fever, chills, generalized bodyaches which started this morning.  There has been a cough which is productive of a small amount of sputum which she swallows.  She has felt short of breath.  She denies nausea or vomiting.  She denies any sick contacts.  She has not been immunized against influenza or COVID-19.  She denies sick contacts.  She has not treated herself at home, but she did receive an nebulizer treatment from fire rescue which did give some slight improvement.   Home Medications Prior to Admission medications   Medication Sig Start Date End Date Taking? Authorizing Provider  albuterol (VENTOLIN HFA) 108 (90 Base) MCG/ACT inhaler Inhale 2 puffs into the lungs every 6 (six) hours as needed for wheezing or shortness of breath. 02/15/22   Roma Schanz R, DO  amLODipine (NORVASC) 10 MG tablet Take 1 tablet (10 mg total) by mouth daily. 02/15/22   [name], DO  Blood Glucose Monitoring Suppl (ONETOUCH VERIO) w/Device KIT Use to check blood sugar 2 times daily 04/29/22   Shamleffer, Melanie Crazier, MD  cholecalciferol (VITAMIN D3) 25 MCG (1000 UNIT) tablet Take 2,000 Units by mouth daily.    [provider]  cloNIDine (CATAPRES) 0.1 MG tablet TAKE 1 TABLET(0.1 MG) BY MOUTH TWICE DAILY 02/15/22   Carollee Herter, Alferd Apa, DO  cyanocobalamin (VITAMIN B12) 1000 MCG tablet Take 1,000 mcg by mouth daily.    [provider]  cyclobenzaprine (FLEXERIL) 5 MG tablet Take 1 tablet (5 mg total) by mouth 3 (three) times daily as needed for muscle spasms. 05/06/22   Roma Schanz R, DO  Dulaglutide (TRULICITY)  3 GY/1.8HU SOPN Inject 3 mg as directed once a week. 04/04/22   Shamleffer, Melanie Crazier, MD  Insulin Lispro Prot & Lispro (HUMALOG MIX 75/25 KWIKPEN) (75-25) 100 UNIT/ML Kwikpen Inject 25 Units into the skin 2 (two) times daily. 04/04/22   Shamleffer, Melanie Crazier, MD  Insulin Pen Needle 32G X 4 MM MISC Inject 1 Device into the skin in the morning and at bedtime. Use as instructed to inject insulin daily 04/04/22   Shamleffer, Melanie Crazier, MD  Lancets [facility] DELICA PLUS [id number]) MISC Use as instructed to check blood sugar 2 times daily 02/23/21   Shamleffer, Melanie Crazier, MD  [facility] VERIO test strip Use as instructed to check blood sugar 2 times daily (Dx: E11.9, Z79.1- type 2 DM with long term insulin use) 07/11/22   Carollee Herter, Alferd Apa, DO  pravastatin (PRAVACHOL) 40 MG tablet Take 1 tablet (40 mg total) by mouth daily. 04/18/22   Roma Schanz R, DO  triamcinolone cream (KENALOG) 0.1 % Apply 1 application topically 2 (two) times daily. 03/03/20   [name], DO  triamterene-hydrochlorothiazide (MAXZIDE-25) 37.5-25 MG tablet Take 1 tablet by mouth daily. 02/15/22   [name], DO  Vitamin D, Ergocalciferol, (DRISDOL) 1.25 MG (50000 UNIT) CAPS capsule Take 1 capsule (50,000 Units total) by mouth every 7 (seven) days. Patient not taking: Reported on 07/11/2022 08/20/21   [name], DO  Allergies    Crestor [rosuvastatin calcium], Metformin and related, Atorvastatin, and Codeine    Review of Systems   Review of Systems  Cardiovascular:  Positive for chest pain.  All other systems reviewed and are negative.   Physical Exam Updated Vital Signs BP (!) 183/101   Pulse (!) 119   Temp (!) 101.8 F (38.8 C)   Resp 20   SpO2 92%  Physical Exam Vitals and nursing note reviewed.   Somewhat ill-appearing 61 year old female, but his in no acute distress. Vital signs are significant for elevated heart rate, respiratory rate, temperature, blood  pressure. Oxygen saturation is 96%, which is normal. Head is normocephalic and atraumatic. PERRLA, EOMI. Oropharynx is clear. Neck is nontender and supple without adenopathy or JVD. Back is nontender and there is no CVA tenderness. Lungs have a prolonged exhalation phase with a few scattered wheezes and rhonchi but no rales. Chest is nontender. Heart has regular rate and rhythm without murmur. Abdomen is soft, flat, nontender. Extremities have no cyanosis or edema, full range of motion is present. Skin is warm and dry without rash. Neurologic: Mental status is normal, cranial nerves are intact, moves all extremities equally.  ED Results / Procedures / Treatments   Labs (all labs ordered are listed, but only abnormal results are displayed) Labs Reviewed  BASIC METABOLIC PANEL - Abnormal; Notable for the following components:      Result Value   Potassium 2.8 (*)    Glucose, Bld 121 (*)    Calcium 8.7 (*)    All other components within normal limits  RESP PANEL BY RT-PCR (RSV, FLU A&B, COVID)  [id number]  CBC  TROPONIN I (HIGH SENSITIVITY)  TROPONIN I (HIGH SENSITIVITY)    EKG EKG Interpretation  Date/Time:  Thursday October 06 2022 16:48:38 EST Ventricular Rate:  125 PR Interval:  160 QRS Duration: 82 QT Interval:  294 QTC Calculation: 424 R Axis:   -53 Text Interpretation: Sinus tachycardia Left anterior fascicular block Moderate voltage criteria for LVH, may be normal variant ( R in aVL , Cornell product ) Cannot rule out Inferior infarct (masked by fascicular block?) , age undetermined Abnormal ECG When compared with ECG of 15-Apr-2020 18:08, HEART RATE has increased Confirmed by Delora Fuel ([id number]) on 10/07/2022 12:11:37 AM  Radiology DG Chest 2 View  Result Date: 10/06/2022 CLINICAL DATA:  chest pain EXAM: CHEST - 2 VIEW COMPARISON:  04/15/2020. FINDINGS: The heart size and mediastinal contours are within normal limits. Both lungs are clear. No pneumothorax or pleural  effusion. Aorta is calcified. There are thoracic degenerative changes. IMPRESSION: No active cardiopulmonary disease. Electronically Signed   By: Sammie Bench M.D.   On: 10/06/2022 17:31    Procedures Procedures  Cardiac monitor shows sinus tachycardia, per my interpretation.  Medications Ordered in ED Medications  magnesium sulfate IVPB 2 g 50 mL (2 g Intravenous New Bag/Given 10/07/22 0245)  acetaminophen (TYLENOL) tablet 1,000 mg (1,000 mg Oral Given 10/06/22 2255)  ketorolac (TORADOL) 30 MG/ML injection 30 mg (30 mg Intravenous Given 10/07/22 0040)  ipratropium-albuterol (DUONEB) 0.5-2.5 (3) MG/3ML nebulizer solution 3 mL (3 mLs Nebulization Given 10/07/22 0042)  acetaminophen (TYLENOL) tablet 650 mg (650 mg Oral Given 10/07/22 0039)  potassium chloride SA (KLOR-CON M) CR tablet 40 mEq (40 mEq Oral Given 10/07/22 0039)  potassium chloride 10 mEq in 100 mL IVPB (0 mEq Intravenous Stopped 10/07/22 0236)  predniSONE (DELTASONE) tablet 60 mg (60 mg Oral Given 10/07/22 0243)  oseltamivir (TAMIFLU) capsule  75 mg (75 mg Oral Given 10/07/22 0244)    ED Course/ Medical Decision Making/ A&P                           Medical Decision Making Amount and/or Complexity of Data Reviewed Labs: ordered. Radiology: ordered.  Risk OTC drugs. Prescription drug management.   Influenza-like illness with fever, cough, generalized bodyaches.  I have reviewed and interpreted her electrocardiogram, and my interpretation is sinus tachycardia with left anterior fascicular block.  No acute ST or T changes.  Chest x-ray shows no active cardiopulmonary disease.  I have independently viewed the images, and agree with radiologist interpretation.  I have reviewed and interpreted her laboratory tests, and my interpretation is severe hypokalemia, mildly elevated random glucose, normal troponin x 2, normal CBC.  Hypokalemia is likely secondary to diuretic as she takes hydrochlorothiazide-triamterene.  Prior  electrolytes on record going back through 2021 show persistent hypokalemia.  Respiratory pathogen panel is pending.  I have ordered acetaminophen and ketorolac for fever and pain.  I have ordered a nebulizer treatment with albuterol and ipratropium.  I have also ordered a magnesium level.  Following nebulizer treatment, patient is feeling better.  Also, pain and fever improved following acetaminophen and ketorolac.  On reevaluation, bronchospasm is significantly decreased.  I have ordered a dose of oral prednisone.  Her respiratory pathogen panel has come back positive for influenza A.  I have discussed with her risks and benefits of oseltamavir and she has requested a prescription for that.  I have ordered an initial dose.  Magnesium level has come back low at 1.5.  I have ordered some intravenous magnesium.  I am discharging her with prescriptions for oral potassium, oral magnesium, prednisone, albuterol inhaler, oseltamavir.  Since she does take a potassium sparing diuretic, I have requested she follow-up with her primary care physician and 1 week to recheck her potassium and magnesium levels.  CRITICAL CARE Performed by: Delora Fuel Total critical care time: 45 minutes Critical care time was exclusive of separately billable procedures and treating other patients. Critical care was necessary to treat or prevent imminent or life-threatening deterioration. Critical care was time spent personally by me on the following activities: development of treatment plan with patient and/or surrogate as well as nursing, discussions with consultants, evaluation of patient's response to treatment, examination of patient, obtaining history from patient or surrogate, ordering and performing treatments and interventions, ordering and review of laboratory studies, ordering and review of radiographic studies, pulse oximetry and re-evaluation of patient's condition.  Final Clinical Impression(s) / ED Diagnoses Final  diagnoses:  Influenza A  Bronchospasm  Diuretic-induced hypokalemia  Elevated blood pressure reading with diagnosis of hypertension  Hypomagnesemia    Rx / DC Orders ED Discharge Orders          Ordered    oseltamivir (TAMIFLU) 75 MG capsule  Every 12 hours        10/07/22 0235    potassium chloride SA (KLOR-CON M) 20 MEQ tablet  Daily        10/07/22 0235    magnesium 30 MG tablet  Daily        10/07/22 0235    albuterol (VENTOLIN HFA) 108 (90 Base) MCG/ACT inhaler  Every 4 hours PRN       Note to Pharmacy: **Patient requests 90 days supply**   10/07/22 0245    predniSONE (DELTASONE) 50 MG tablet  Daily  10/07/22 7703              Delora Fuel, MD 40/35/24 [phone]

## 2022-10-07 NOTE — Discharge Instructions (Addendum)
Use the inhaler every 4 hours as needed to help with breathing and coughing.  Take acetaminophen and/or ibuprofen as needed for fever or aching.  Please be aware that if you take both medications, you will get better pain and fever control then you get from either medication by itself.  Make sure to drink plenty of fluids.  Your potassium and magnesium were low today and you have been given potassium and magnesium in the emergency department as well as prescriptions to take at home.  It is very important that you have the levels rechecked and followed closely since your blood pressure medicine can lead to potassium and magnesium levels getting too high.

## 2022-10-07 NOTE — ED Notes (Signed)
Pt able to understand discharge instructions with no difficulty. Pt discharged to facility with family member

## 2022-10-20 ENCOUNTER — Ambulatory Visit (INDEPENDENT_AMBULATORY_CARE_PROVIDER_SITE_OTHER): Payer: Medicare Other | Admitting: Pharmacist

## 2022-10-20 DIAGNOSIS — E1165 Type 2 diabetes mellitus with hyperglycemia: Secondary | ICD-10-CM

## 2022-10-20 DIAGNOSIS — I10 Essential (primary) hypertension: Secondary | ICD-10-CM

## 2022-10-20 DIAGNOSIS — E559 Vitamin D deficiency, unspecified: Secondary | ICD-10-CM

## 2022-10-20 MED ORDER — PRAVASTATIN SODIUM 40 MG PO TABS: 40.0000 mg | ORAL_TABLET | Freq: Every day | ORAL | 0 refills | Status: DC

## 2022-10-20 MED ORDER — ONETOUCH VERIO VI STRP: ORAL_STRIP | 0 refills | Status: DC

## 2022-10-20 NOTE — Progress Notes (Signed)
Pharmacy Note  10/20/2022 Name: Heather Walker MRN: [id number] DOB: Jan 15, 1961  Subjective: Heather Walker is a 62 y.o. year old female who is a primary care patient of Carollee Herter, Alferd Apa, DO. Clinical Pharmacist Practitioner referral was placed to assist with medication management.    Engaged with patient by telephone for follow up visit today.  Type 2 DM: A1c not quite at goal when checked by Dr Kelton Pillar 03/2022. Patient continues to take Trulicity 69m weekly and Humalog Mix 75/25 - 25 units twice a day. Denies hypoglycemia. Reports home blood glucose has been 98 to 165. Noted that Endo office left patient a message on MyChart [facility]. Patient states she has not completed because she has not received application in mail yet.   Hypertension: blood pressure was noted to be 138/82 at last OV with endo in 03/2022. Current medications for blood pressure  include amlodipine, clonidine and triamterene-hydrochlorothiazide. Patient does not check blood pressure at home.   Hyperlipidemia: patient report she is taking pravastatin daily. Filled 90 days 07/11/2022.  She missed appt with Dr LCarollee HerterNovember 2023 to recheck labs.  Unable to take rosuvastatin - anaphylaxis or atorvastatin - caused neck stiffness.   Low vitamin D and B12: Last checked 05/2020 - B12 was low normal at 264. Took injections for awhile. After our last visit she did start over-the-counter B12 10030m daily. Last Vitamin D was 12. Prescribed weekly vitamin D in past but patient never started due to cost. She was unsure why.  She has not started over-the-counter Vitamin D as recommended at last visit 07/11/2022  Low serum Magnesium and potassium: Patient was seen in ED for influenza A. Noted to have low potassium and magnesium. Prescribed magnesium 3042maily,  and potassium 20 mEq daily x 15 days. She reports she did not start magnesium because she was not  aware that it was low. She has 3 days of potassium remaining.  She is taking potassium sparing diuretic.   Objective: Review of patient status, including review of consultants reports, laboratory and other test data, was performed as part of comprehensive evaluation and provision of chronic care management services.   Lab Results  Component Value Date   CREATININE 0.90 10/06/2022   CREATININE 0.95 02/15/2022   CREATININE 1.02 08/20/2021    Lab Results  Component Value Date   HGBA1C 7.1 (A) 04/04/2022       Component Value Date/Time   CHOL 197 02/15/2022 1334   TRIG 102.0 02/15/2022 1334   HDL 70.50 02/15/2022 1334   CHOLHDL 3 02/15/2022 1334   VLDL 20.4 02/15/2022 1334   LDLCALC 106 (H) 02/15/2022 1334   LDLCALC 89 07/16/2020 1320   LDLDIRECT 115.0 08/02/2018 1525     Clinical ASCVD: Yes  The 10-year ASCVD risk score (Arnett DK, et al., 2019) is: 15.7%   Values used to calculate the score:     Age: [age]     Sex: Female     Is Non-Hispanic African American: Yes     Diabetic: Yes     Tobacco smoker: No     Systolic Blood Pressure: 139409Hg     Is BP treated: Yes     HDL Cholesterol: 70.5 mg/dL     Total Cholesterol: 197 mg/dL    BP Readings from Last 3 Encounters:  10/07/22 139/86  04/04/22 138/82  02/15/22 132/88     Allergies  Allergen Reactions   Crestor [  Rosuvastatin Calcium] Anaphylaxis   Metformin And Related Diarrhea    Nausea and diarrhea   Atorvastatin Other (See Comments)    Neck stiffness   Codeine Itching    Medications Reviewed Today     Reviewed by [name], RPH-CPP (Pharmacist) on 10/20/22 at 1032  Med List Status: <None>   Medication Order Taking? Sig Documenting Provider Last Dose Status Informant  albuterol (VENTOLIN HFA) 108 (90 Base) MCG/ACT inhaler [id number] Yes Inhale 2 puffs into the lungs every 4 (four) hours as needed for wheezing or shortness of breath (or coughing). Delora Fuel, MD Taking Active   amLODipine  (NORVASC) 10 MG tablet [id number] Yes TAKE 1 TABLET(10 MG) BY MOUTH DAILY Carollee Herter, Alferd Apa, DO Taking Active   Blood Glucose Monitoring Suppl (ONETOUCH VERIO) w/Device KIT [id number] Yes Use to check blood sugar 2 times daily Shamleffer, Melanie Crazier, MD Taking Active   cholecalciferol (VITAMIN D3) 25 MCG (1000 UNIT) tablet [id number] Yes Take 2,000 Units by mouth daily. [provider] Taking Active   cloNIDine (CATAPRES) 0.1 MG tablet [id number] Yes TAKE 1 TABLET(0.1 MG) BY MOUTH TWICE DAILY Roma Schanz R, DO Taking Active   cyanocobalamin (VITAMIN B12) 1000 MCG tablet [id number] Yes Take 1,000 mcg by mouth daily. [provider] Taking Active   cyclobenzaprine (FLEXERIL) 5 MG tablet [id number]  Take 1 tablet (5 mg total) by mouth 3 (three) times daily as needed for muscle spasms. Carollee Herter, Alferd Apa, DO  Active   Dulaglutide (TRULICITY) 3 ML/5.4GB Bonney Aid [id number] Yes Inject 3 mg as directed once a week. Shamleffer, Melanie Crazier, MD Taking Active            Med Note Barbaraann Boys Jul 11, 2022 11:34 AM)    Insulin Lispro Prot & Lispro (HUMALOG MIX 75/25 KWIKPEN) (75-25) 100 UNIT/ML Claiborne Rigg [id number] Yes Inject 25 Units into the skin 2 (two) times daily. Shamleffer, Melanie Crazier, MD Taking Active   Insulin Pen Needle 32G X 4 MM MISC [id number] Yes Inject 1 Device into the skin in the morning and at bedtime. Use as instructed to inject insulin daily Shamleffer, Melanie Crazier, MD Taking Active   Lancets (ONETOUCH DELICA PLUS ENMMHW80S) [place] [id number] Yes Use as instructed to check blood sugar 2 times daily Shamleffer, Melanie Crazier, MD Taking Active   magnesium 30 MG tablet [id number] No Take 1 tablet (30 mg total) by mouth daily.  Patient not taking: Reported on 03/18/8637   Delora Fuel, MD Not Taking Active   [facility] VERIO test strip [id number] Yes Use as instructed to check blood sugar 2 times daily (Dx: E11.9, Z79.1- type 2 DM with long term insulin use)  Carollee Herter, Alferd Apa, DO Taking Active   potassium chloride SA (KLOR-CON M) 20 MEQ tablet [id number] Yes Take 1 tablet (20 mEq total) by mouth daily. Delora Fuel, MD Taking Active   pravastatin (PRAVACHOL) 40 MG tablet [id number] Yes Take 1 tablet (40 mg total) by mouth daily. [name], DO Taking Active   triamcinolone cream (KENALOG) 0.1 % [id number]  Apply 1 application topically 2 (two) times daily. Carollee Herter, Alferd Apa, DO  Active Other  triamterene-hydrochlorothiazide (MAXZIDE-25) 37.5-25 MG tablet [id number] Yes Take 1 tablet by mouth daily. [name], DO Taking Active   Vitamin D, Ergocalciferol, (DRISDOL) 1.25 MG (50000 UNIT) CAPS capsule [id number] No Take 1 capsule (50,000 Units total) by mouth every 7 (seven) days.  Patient not taking: Reported on 07/11/2022   [facility]  Koren Shiver, DO Not Taking Active             Patient Active Problem List   Diagnosis Date Noted   Unspecified adrenocortical insufficiency ([place]) 02/21/2022   Type 2 diabetes mellitus with diabetic peripheral angiopathy without gangrene, without long-term current use of insulin ([place]) 08/20/2021   Hyperlipidemia 07/16/2020   Dizziness 04/15/2020   BMI 35.0-35.9,adult 04/15/2020   Weakness 04/15/2020   Abnormal cortisol level 04/14/2020   SIRS (systemic inflammatory response syndrome) ([place]) 04/06/2020   Pneumonia due to COVID-19 virus 05/14/2019   COVID-19 virus infection 05/13/2019   Renal insufficiency 03/26/2019   At risk for cardiovascular event 03/26/2019   Allergic reaction due to correct medicinal substance properly administered 08/23/2018   Hypertriglyceridemia 08/05/2018   Asthma, chronic, unspecified asthma severity, uncomplicated 94/17/4081   Chronic right shoulder pain 08/05/2018   Muscle spasm 08/05/2018   Insomnia 10/05/2017   Sepsis ([place]) 08/21/2017   Acute right hip pain 08/21/2017   Preventative health care 11/20/2016   Right ankle pain 05/09/2016   Palpitations  12/08/2014   DM (diabetes mellitus) type II uncontrolled, periph vascular disorder 08/03/2014   Leg wound, right 08/03/2014   Hyperglycemia 07/13/2014   Chest pain 07/12/2014   Diabetes mellitus ([place]) 07/12/2014   Acute kidney injury ([place]) 07/12/2014   Abdominal pain, right upper quadrant 05/28/2014   Tachycardia 05/03/2013   Abscess 11/13/2012   Breast pain, right 08/10/2012   Osteoarthritis of left shoulder 03/04/2011   BACK PAIN, LUMBAR, WITH RADICULOPATHY 09/17/2009   CALF PAIN, RIGHT 09/17/2009   RENAL CYST 08/28/2009   LOW BACK PAIN, ACUTE 11/13/2008   FIBROIDS, UTERUS 09/10/2007   UTI 09/03/2007   BACTERIAL VAGINITIS 09/03/2007   Flank pain 09/03/2007   Hypokalemia 09/03/2007   MOLE 06/21/2007   ALLERGIC RHINITIS 06/21/2007   HEMOCCULT POSITIVE STOOL 06/21/2007   SINUSITIS, ACUTE NOS 04/05/2007   REFLUX, ESOPHAGEAL 04/05/2007   CHEST PAIN 04/05/2007   Essential hypertension 03/19/2007   ALLERGIC RHINITIS, SEASONAL 03/09/2007   Asthma 03/09/2007     Medication Assistance:  None required.  Patient affirms current coverage meets needs.   Assessment / Plan:   Type 2 DM: A1c not quite at goal of < 7.0% Continue current therapy Trulicity 59m weekly and Humalog Mix 75/25 - 25 units twice a day.  Continue to check blood glucose twice a day - updated Rx for test strips.  [facility].   Hypertension: blood pressure at goal.  Continue amlodipine, clonidine and triamterene-hydrochlorothiazide.   Hyperlipidemia: Last LDL was not at goal of < 100.  Continue pravastatin 450mdaily. Made appointment for CPE with PCP 11/15/2022. If LDL still above goal of 100 consider either increasing dose of pravastatin to 8054maily or add ezetimibe 27m98mily. daily.  Low vitamin D and B12:  Continue over-the-counter B12 1000mc51mily.  Start over-the-counter Vitamin D 2000 units  daily  Recheck B12 and vitamin D at upcoming appt with PCP  Low serum Magnesium and potassium:  Start magnesium 30mg 77my Complete current prescription for potassium 20 mEq daily x 15 days.  Recheck magnesium and potassium at upcoming appt with PCP  Medication management:  Reviewed med list and updated Reviewed refill history and discussed adherence  Meds ordered this encounter  Medications   ONETOUCH VERIO test strip    Sig: Use as instructed to check blood sugar 2 times daily (Dx: E11.9, Z79.1-  type 2 DM with long term insulin use)    Dispense:  100 each    Refill:  0   pravastatin (PRAVACHOL) 40 MG tablet    Sig: Take 1 tablet (40 mg total) by mouth daily.    Dispense:  90 tablet    Refill:  0    Follow Up:  Telephone follow up appointment with care management team member scheduled for:  2 to 3 months.    [name], PharmD Clinical Pharmacist [place] [phone]

## 2022-10-20 NOTE — Patient Instructions (Addendum)
Heather Walker It was a pleasure speaking with you today.  Below is a summary of our recent visit.     1 - Start vitamin D 2000 units once a day - you can purchase at your pharmacy either capsules that are 2000 units or 1000 units (take 2).  2 - Start magnesium '30mg'$  once daily - this can be purchased at the pharmacy in the vitamin section 3 - It is important that we recheck your potassium, magnesium and other labs. You have a physical scheduled with Dr Carollee Herter for 11/15/2022 at 1pm. 4 - I have included an application for 1219 for [employer] program for your Trulicity and Humalog 75/88 Mix. Please complete and return to Dr [facility] office.   As always if you have any questions or concerns especially regarding medications, please feel free to contact me either at the phone number below or with a MyChart message.    Keep up the good work!   [name], PharmD Clinical Pharmacist [facility] [phone] (direct line)  [phone] (main office number)

## 2022-10-26 ENCOUNTER — Telehealth: Payer: Self-pay | Admitting: Family Medicine

## 2022-10-26 NOTE — Telephone Encounter (Signed)
Copied from [place] [id number]. Topic: Medicare AWV >> Oct 26, 2022  1:08 PM Gillis Santa wrote: Reason for CRM: LVM PATIENT TO CALL [phone] [place]

## 2022-11-15 ENCOUNTER — Ambulatory Visit (INDEPENDENT_AMBULATORY_CARE_PROVIDER_SITE_OTHER): Payer: Medicare Other | Admitting: Family Medicine

## 2022-11-15 ENCOUNTER — Encounter: Payer: Self-pay | Admitting: Family Medicine

## 2022-11-15 VITALS — BP 130/80 | HR 71 | Temp 98.3°F | Resp 18 | Ht 63.0 in | Wt 195.2 lb

## 2022-11-15 DIAGNOSIS — J45909 Unspecified asthma, uncomplicated: Secondary | ICD-10-CM

## 2022-11-15 DIAGNOSIS — E1169 Type 2 diabetes mellitus with other specified complication: Secondary | ICD-10-CM

## 2022-11-15 DIAGNOSIS — E1151 Type 2 diabetes mellitus with diabetic peripheral angiopathy without gangrene: Secondary | ICD-10-CM | POA: Diagnosis not present

## 2022-11-15 DIAGNOSIS — Z1211 Encounter for screening for malignant neoplasm of colon: Secondary | ICD-10-CM

## 2022-11-15 DIAGNOSIS — Z800 Family history of malignant neoplasm of digestive organs: Secondary | ICD-10-CM | POA: Diagnosis not present

## 2022-11-15 DIAGNOSIS — E2740 Unspecified adrenocortical insufficiency: Secondary | ICD-10-CM | POA: Diagnosis not present

## 2022-11-15 DIAGNOSIS — J4520 Mild intermittent asthma, uncomplicated: Secondary | ICD-10-CM | POA: Diagnosis not present

## 2022-11-15 DIAGNOSIS — E785 Hyperlipidemia, unspecified: Secondary | ICD-10-CM | POA: Diagnosis not present

## 2022-11-15 DIAGNOSIS — E1165 Type 2 diabetes mellitus with hyperglycemia: Secondary | ICD-10-CM

## 2022-11-15 DIAGNOSIS — Z0000 Encounter for general adult medical examination without abnormal findings: Secondary | ICD-10-CM | POA: Diagnosis not present

## 2022-11-15 DIAGNOSIS — Z6835 Body mass index (BMI) 35.0-35.9, adult: Secondary | ICD-10-CM

## 2022-11-15 DIAGNOSIS — I10 Essential (primary) hypertension: Secondary | ICD-10-CM | POA: Diagnosis not present

## 2022-11-15 DIAGNOSIS — E559 Vitamin D deficiency, unspecified: Secondary | ICD-10-CM | POA: Diagnosis not present

## 2022-11-15 LAB — COMPREHENSIVE METABOLIC PANEL
ALT: 10 U/L (ref 0–35)
AST: 13 U/L (ref 0–37)
Albumin: 4 g/dL (ref 3.5–5.2)
Alkaline Phosphatase: 119 U/L — ABNORMAL HIGH (ref 39–117)
BUN: 9 mg/dL (ref 6–23)
CO2: 30 mEq/L (ref 19–32)
Calcium: 8.9 mg/dL (ref 8.4–10.5)
Chloride: 102 mEq/L (ref 96–112)
Creatinine, Ser: 0.76 mg/dL (ref 0.40–1.20)
GFR: 85.39 mL/min (ref >60.00)
Glucose, Bld: 131 mg/dL — ABNORMAL HIGH (ref 70–99)
Potassium: 3.7 mEq/L (ref 3.5–5.1)
Sodium: 140 mEq/L (ref 135–145)
Total Bilirubin: 0.4 mg/dL (ref 0.2–1.2)
Total Protein: 7 g/dL (ref 6.0–8.3)

## 2022-11-15 LAB — CBC WITH DIFFERENTIAL/PLATELET
Basophils Absolute: 0 10*3/uL (ref 0.0–0.1)
Basophils Relative: 0.5 % (ref 0.0–3.0)
Eosinophils Absolute: 0.1 10*3/uL (ref 0.0–0.7)
Eosinophils Relative: 2.5 % (ref 0.0–5.0)
HCT: 37.4 % (ref 36.0–46.0)
Hemoglobin: 12.9 g/dL (ref 12.0–15.0)
Lymphocytes Relative: 52.1 % — ABNORMAL HIGH (ref 12.0–46.0)
Lymphs Abs: 2.1 10*3/uL (ref 0.7–4.0)
MCHC: 34.6 g/dL (ref 30.0–36.0)
MCV: 82.7 fl (ref 78.0–100.0)
Monocytes Absolute: 0.4 10*3/uL (ref 0.1–1.0)
Monocytes Relative: 10.3 % (ref 3.0–12.0)
Neutro Abs: 1.4 10*3/uL (ref 1.4–7.7)
Neutrophils Relative %: 34.6 % — ABNORMAL LOW (ref 43.0–77.0)
Platelets: 239 10*3/uL (ref 150.0–400.0)
RBC: 4.52 Mil/uL (ref 3.87–5.11)
RDW: 15.5 % (ref 11.5–15.5)
WBC: 4 10*3/uL (ref 4.0–10.5)

## 2022-11-15 LAB — MAGNESIUM: Magnesium: 1.8 mg/dL (ref 1.5–2.5)

## 2022-11-15 LAB — LIPID PANEL
Cholesterol: 161 mg/dL (ref 0–200)
HDL: 68.5 mg/dL (ref >39.00)
LDL Cholesterol: 70 mg/dL (ref 0–99)
NonHDL: 92.99
Total CHOL/HDL Ratio: 2
Triglycerides: 113 mg/dL (ref 0.0–149.0)
VLDL: 22.6 mg/dL (ref 0.0–40.0)

## 2022-11-15 LAB — MICROALBUMIN / CREATININE URINE RATIO
Creatinine,U: 58.7 mg/dL
Microalb Creat Ratio: 4.3 mg/g (ref 0.0–30.0)
Microalb, Ur: 2.5 mg/dL — ABNORMAL HIGH (ref 0.0–1.9)

## 2022-11-15 LAB — HEMOGLOBIN A1C: Hgb A1c MFr Bld: 7.6 % — ABNORMAL HIGH (ref 4.6–6.5)

## 2022-11-15 LAB — TSH: TSH: 2.11 u[IU]/mL (ref 0.35–5.50)

## 2022-11-15 LAB — VITAMIN D 25 HYDROXY (VIT D DEFICIENCY, FRACTURES): VITD: 10.06 ng/mL — ABNORMAL LOW (ref 30.00–100.00)

## 2022-11-15 NOTE — Progress Notes (Signed)
Subjective:   By signing my name below, I, Heather Walker, attest that this documentation has been prepared under the direction and in the presence of [name], DO. 11/15/2022   Patient ID: Heather Walker, female    DOB: [birth year], 62 y.o.   MRN: [id number]  Chief Complaint  Patient presents with   Annual Exam    Pt states fasting     HPI Patient is in today for a comprehensive physical exam.   She denies having any fever, new moles, congestion, sore throat, sinus pain, new muscle pain, new joint pain, chest pain, palpations, cough, SOB, wheezing, n/v/d, constipation, dysuria, frequency, hematuria, or headaches at this time. She reports her sister was diagnosed with cancer and stage 2 kidney disease. Her brother was diagnosed with colon cancer. Otherwise she has no changes to her family medical history.  She does not have the shingles vaccines and is interested in receiving it at her pharmacy. She is due for tetanus and is willing  to receive it at her pharmacy.  She is participating in regular exercise by staying active with her grandchildren. She is planning on walking more regularly.  Last mammogram was 10/07/2021. Results are normal. Repeat in 1 year.  Bone density exam was last completed 10/04/2021. Results are normal.  She has never completed a colonoscopy in the past and is interested in scheduling an appointment due to her brothers recent colon cancer diagnosis.  She is UTD on dental and vision care.    Past Medical History:  Diagnosis Date   Arthritis    Asthma    Depression    Diabetes mellitus without complication ([place])    Heart murmur    Hypertension     Past Surgical History:  Procedure Laterality Date   ABDOMINAL HYSTERECTOMY     APPENDECTOMY     BLADDER SUSPENSION     CESAREAN SECTION     3 previous   TEE WITHOUT CARDIOVERSION N/A 08/24/2017   Procedure: TRANSESOPHAGEAL ECHOCARDIOGRAM (TEE);  Surgeon: Larey Dresser, MD;  [facility]  ENDOSCOPY;  Service: Cardiovascular;  Laterality: N/A;   TUBAL LIGATION      Family History  Problem Relation Age of Onset   Hypertension Mother    Asthma Mother    Hypertension Sister    Asthma Sister    Diabetes Sister    Heart defect Sister    Asthma Sister    Cancer Sister    Kidney disease Sister    Aneurysm Sister    Asthma Sister    Colon cancer Brother    Hypertension Maternal Grandmother     Social History   Socioeconomic History   Marital status: Married    Spouse name: Not on file   Number of children: Not on file   Years of education: ged   Highest education level: Not on file  Occupational History   Occupation: disabled  Tobacco Use   Smoking status: Never   Smokeless tobacco: Never  Vaping Use   Vaping Use: Never used  Substance and Sexual Activity   Alcohol use: No   Drug use: No   Sexual activity: Yes    Partners: Male    Birth control/protection: Surgical  Other Topics Concern   Not on file  Social History Narrative   Exercise - watching 2 toddlers    Social Determinants of Health   Financial Resource Strain: Medium Risk (04/18/2022)   Overall Financial Resource Strain (CARDIA)    Difficulty of  Paying Living Expenses: Somewhat hard  Food Insecurity: No Food Insecurity (07/13/2021)   Hunger Vital Sign    Worried About Running Out of Food in the Last Year: Never true    Ran Out of Food in the Last Year: Never true  Transportation Needs: No Transportation Needs (07/13/2021)   PRAPARE - Hydrologist (Medical): No    Lack of Transportation (Non-Medical): No  Physical Activity: Insufficiently Active (11/04/2021)   Exercise Vital Sign    Days of Exercise per Week: 3 days    Minutes of Exercise per Session: 30 min  Stress: No Stress Concern Present (07/13/2021)   Noank    Feeling of Stress : Not at all  Social Connections: Moderately Integrated  (07/13/2021)   Social Connection and Isolation Panel [NHANES]    Frequency of Communication with Friends and Family: More than three times a week    Frequency of Social Gatherings with Friends and Family: More than three times a week    Attends Religious Services: More than 4 times per year    Active Member of [employer] or Organizations: No    Attends Archivist Meetings: Never    Marital Status: Married  Human resources officer Violence: Not At Risk (07/13/2021)   Humiliation, Afraid, Rape, and Kick questionnaire    Fear of Current or Ex-Partner: No    Emotionally Abused: No    Physically Abused: No    Sexually Abused: No    Outpatient Medications Prior to Visit  Medication Sig Dispense Refill   albuterol (VENTOLIN HFA) 108 (90 Base) MCG/ACT inhaler Inhale 2 puffs into the lungs every 4 (four) hours as needed for wheezing or shortness of breath (or coughing). 1 each 0   amLODipine (NORVASC) 10 MG tablet TAKE 1 TABLET(10 MG) BY MOUTH DAILY 90 tablet 1   Blood Glucose Monitoring Suppl (ONETOUCH VERIO) w/Device KIT Use to check blood sugar 2 times daily 1 kit 0   cholecalciferol (VITAMIN D3) 25 MCG (1000 UNIT) tablet Take 2,000 Units by mouth daily.     cloNIDine (CATAPRES) 0.1 MG tablet TAKE 1 TABLET(0.1 MG) BY MOUTH TWICE DAILY 180 tablet 1   cyanocobalamin (VITAMIN B12) 1000 MCG tablet Take 1,000 mcg by mouth daily.     cyclobenzaprine (FLEXERIL) 5 MG tablet Take 1 tablet (5 mg total) by mouth 3 (three) times daily as needed for muscle spasms. 30 tablet 1   Dulaglutide (TRULICITY) 3 IR/4.4RX SOPN Inject 3 mg as directed once a week. 6 mL 3   Insulin Lispro Prot & Lispro (HUMALOG MIX 75/25 KWIKPEN) (75-25) 100 UNIT/ML Kwikpen Inject 25 Units into the skin 2 (two) times daily. 45 mL 3   Insulin Pen Needle 32G X 4 MM MISC Inject 1 Device into the skin in the morning and at bedtime. Use as instructed to inject insulin daily 200 each 3   Lancets (ONETOUCH DELICA PLUS [id number]) MISC Use as  instructed to check blood sugar 2 times daily 100 each 6   magnesium 30 MG tablet Take 1 tablet (30 mg total) by mouth daily. 15 tablet 0   ONETOUCH VERIO test strip Use as instructed to check blood sugar 2 times daily (Dx: E11.9, Z79.1- type 2 DM with long term insulin use) 100 each 0   potassium chloride SA (KLOR-CON M) 20 MEQ tablet Take 1 tablet (20 mEq total) by mouth daily. 15 tablet 0   pravastatin (PRAVACHOL) 40 MG tablet  Take 1 tablet (40 mg total) by mouth daily. 90 tablet 0   triamcinolone cream (KENALOG) 0.1 % Apply 1 application topically 2 (two) times daily. 30 g 0   triamterene-hydrochlorothiazide (MAXZIDE-25) 37.5-25 MG tablet Take 1 tablet by mouth daily. 90 tablet 3   Vitamin D, Ergocalciferol, (DRISDOL) 1.25 MG (50000 UNIT) CAPS capsule Take 1 capsule (50,000 Units total) by mouth every 7 (seven) days. 12 capsule 1   No facility-administered medications prior to visit.    Allergies  Allergen Reactions   Crestor [Rosuvastatin Calcium] Anaphylaxis   Metformin And Related Diarrhea    Nausea and diarrhea   Atorvastatin Other (See Comments)    Neck stiffness   Codeine Itching    Review of Systems  Constitutional:  Negative for fever and malaise/fatigue.  HENT:  Negative for congestion, sinus pain and sore throat.   Eyes:  Negative for blurred vision.  Respiratory:  Negative for cough, shortness of breath and wheezing.   Cardiovascular:  Negative for chest pain, palpitations and leg swelling.  Gastrointestinal:  Negative for abdominal pain, blood in stool, constipation, diarrhea, nausea and vomiting.  Genitourinary:  Negative for dysuria, frequency and hematuria.  Musculoskeletal:  Negative for falls.       (-)new muscle pain (-)new joint pain  Skin:  Negative for rash.       (-)New moles  Neurological:  Negative for dizziness, loss of consciousness and headaches.  Endo/Heme/Allergies:  Negative for environmental allergies.  Psychiatric/Behavioral:  Negative for  depression. The patient is not nervous/anxious.        Objective:    Physical Exam Vitals and nursing note reviewed.  Constitutional:      General: She is not in acute distress.    Appearance: Normal appearance. She is well-developed. She is not ill-appearing.  HENT:     Head: Normocephalic and atraumatic.     Right Ear: Tympanic membrane, ear canal and external ear normal.     Left Ear: Tympanic membrane, ear canal and external ear normal.  Eyes:     Extraocular Movements: Extraocular movements intact.     Conjunctiva/sclera: Conjunctivae normal.     Pupils: Pupils are equal, round, and reactive to light.  Neck:     Thyroid: No thyromegaly.     Vascular: No carotid bruit or JVD.  Cardiovascular:     Rate and Rhythm: Normal rate and regular rhythm.     Heart sounds: Normal heart sounds. No murmur heard.    No gallop.  Pulmonary:     Effort: Pulmonary effort is normal. No respiratory distress.     Breath sounds: Normal breath sounds. No wheezing or rales.  Chest:     Chest wall: No tenderness.  Abdominal:     General: Bowel sounds are normal. There is no distension.     Palpations: Abdomen is soft.     Tenderness: There is no abdominal tenderness. There is no guarding.  Musculoskeletal:        General: Normal range of motion.     Cervical back: Normal range of motion and neck supple.  Skin:    General: Skin is warm and dry.  Neurological:     General: No focal deficit present.     Mental Status: She is alert and oriented to person, place, and time.  Psychiatric:        Mood and Affect: Mood normal.        Behavior: Behavior normal.        Thought Content: Thought  content normal.        Judgment: Judgment normal.     BP 130/80 (BP Location: Left Arm, Patient Position: Sitting, Cuff Size: Large)   Pulse 71   Temp 98.3 F (36.8 C) (Oral)   Resp 18   Ht '5\' 3"'$  (1.6 m)   Wt 195 lb 3.2 oz (88.5 kg)   SpO2 98%   BMI 34.58 kg/m  Wt Readings from Last 3 Encounters:   11/15/22 195 lb 3.2 oz (88.5 kg)  04/04/22 194 lb 3.2 oz (88.1 kg)  02/15/22 192 lb 6.4 oz (87.3 kg)       Assessment & Plan:  Preventative health care -     CBC with Differential/Platelet -     Comprehensive metabolic panel -     Lipid panel -     TSH -     Magnesium -     VITAMIN D 25 Hydroxy (Vit-D Deficiency, Fractures)  Type 2 diabetes mellitus with hyperglycemia, without long-term current use of insulin (HCC) -     Comprehensive metabolic panel -     Hemoglobin A1c -     Microalbumin / creatinine urine ratio  Essential hypertension Assessment & Plan: Well controlled, no changes to meds. Encouraged heart healthy diet such as the DASH diet and exercise as tolerated.    Orders: -     CBC with Differential/Platelet -     Comprehensive metabolic panel -     Lipid panel  Vitamin D deficiency -     VITAMIN D 25 Hydroxy (Vit-D Deficiency, Fractures)  Hyperlipidemia associated with type 2 diabetes mellitus (HCC) -     Comprehensive metabolic panel -     Lipid panel  Mild intermittent asthma, unspecified whether complicated -     CBC with Differential/Platelet -     Comprehensive metabolic panel -     Lipid panel -     TSH -     Magnesium -     VITAMIN D 25 Hydroxy (Vit-D Deficiency, Fractures)  Unspecified adrenocortical insufficiency (HCC) -     CBC with Differential/Platelet -     Comprehensive metabolic panel -     Lipid panel -     TSH -     Magnesium -     VITAMIN D 25 Hydroxy (Vit-D Deficiency, Fractures)  Family hx of colon cancer -     Ambulatory referral to Gastroenterology  Colon cancer screening -     Ambulatory referral to Gastroenterology  Asthma, chronic, unspecified asthma severity, uncomplicated Assessment & Plan: Stable  As needed albuterol only   BMI 35.0-35.9,adult Assessment & Plan: Pt will start exercising again Right now just running after 2 tolddlers    Type 2 diabetes mellitus with diabetic peripheral angiopathy without  gangrene, without long-term current use of insulin (HCC)    I, [name], DO, personally preformed the services described in this documentation.  All medical record entries made by the scribe were at my direction and in my presence.  I have reviewed the chart and discharge instructions (if applicable) and agree that the record reflects my personal performance and is accurate and complete. 11/15/2022   I,Heather Walker,acting as a scribe for [name], DO.,have documented all relevant documentation on the behalf of [name], DO,as directed by  [name], DO while in the presence of [name], DO.   [name], DO

## 2022-11-15 NOTE — Assessment & Plan Note (Signed)
Well controlled, no changes to meds. Encouraged heart healthy diet such as the DASH diet and exercise as tolerated.  °

## 2022-11-15 NOTE — Assessment & Plan Note (Signed)
Pt will start exercising again Right now just running after 2 tolddlers

## 2022-11-15 NOTE — Assessment & Plan Note (Signed)
Stable  As needed albuterol only

## 2022-11-16 ENCOUNTER — Other Ambulatory Visit: Payer: Self-pay

## 2022-11-16 DIAGNOSIS — E559 Vitamin D deficiency, unspecified: Secondary | ICD-10-CM

## 2022-11-16 MED ORDER — VITAMIN D (ERGOCALCIFEROL) 1.25 MG (50000 UNIT) PO CAPS: 50000.0000 [IU] | ORAL_CAPSULE | ORAL | 1 refills | Status: DC

## 2022-11-21 ENCOUNTER — Ambulatory Visit
Admission: RE | Admit: 2022-11-21 | Discharge: 2022-11-21 | Disposition: A | Discharge status: Home / Self Care | Payer: Medicare Other | Source: Ambulatory Visit | Attending: Family Medicine | Admitting: Family Medicine

## 2022-11-21 DIAGNOSIS — Z1231 Encounter for screening mammogram for malignant neoplasm of breast: Secondary | ICD-10-CM | POA: Diagnosis not present

## 2022-12-23 ENCOUNTER — Encounter: Payer: Self-pay | Admitting: Gastroenterology

## 2023-01-18 ENCOUNTER — Telehealth: Payer: Self-pay | Admitting: *Deleted

## 2023-01-18 NOTE — Telephone Encounter (Signed)
!  st attempt to reach pt for pre-visit. LM with call back # and informed pt that RN will attempt to call in 10 min. No other # listed to call but one originally called. 2nd attempt to reach patient for pre-visit done after 10 min. Unable to reach pt. Left call back # and instructed pt that she will have to call back and reschedule pre-visit by end of day or per protocol both her pre-visit and her procedure will be canceled.

## 2023-01-19 ENCOUNTER — Telehealth: Payer: Self-pay | Admitting: Family Medicine

## 2023-01-19 NOTE — Telephone Encounter (Signed)
Copied from [place] [phone] [phone]. Topic: Medicare AWV >> Jan 19, 2023 11:42 AM Devoria Glassing wrote: Reason for CRM: Called patient to schedule Medicare Annual Wellness Visit (AWV). Left message for patient to call back and schedule Medicare Annual Wellness Visit (AWV).  Last date of AWV: 07/13/2021   Please schedule an appointment at any time with Beatris Ship, [place]  .  If any questions, please contact me.  Thank you ,  Sherol Dade; [place] Direct Dial: [phone]

## 2023-01-23 ENCOUNTER — Telehealth: Payer: Medicare Other

## 2023-01-23 ENCOUNTER — Telehealth: Payer: Self-pay | Admitting: Pharmacist

## 2023-01-23 NOTE — Telephone Encounter (Signed)
Patient has follow up with Clinical Pharmacist Practitioner today.  Unable to reach patient for phone appointment. LM on VM with CB# [phone] or [phone].

## 2023-01-23 NOTE — Telephone Encounter (Signed)
Second attempt to reach patient for Chronic Care Management follow up.  No answer. LM on VM with CB#s.

## 2023-01-24 ENCOUNTER — Encounter: Payer: Self-pay | Admitting: Gastroenterology

## 2023-01-24 ENCOUNTER — Telehealth: Payer: Self-pay

## 2023-01-24 NOTE — Progress Notes (Signed)
  Care Coordination Note  01/24/2023 Name: MARIBELA KENNEDY MRN: [id number] DOB: [birth year]  Theodoro Kos is a 62 y.o. year old female who is a primary care patient of Donato Schultz, DO and is actively engaged with the Chronic Care Management team. I reached out to Theodoro Kos by phone today to assist with re-scheduling a follow up visit with the Pharmacist  Follow up plan: Unsuccessful telephone outreach attempt made. A HIPAA compliant phone message was left for the patient providing contact information and requesting a return call.  The care management team will reach out to the patient again over the next 7 days.  If patient returns call to provider office, please advise to call CCM Care Guide Penne Lash  at [phone]  Penne Lash, RMA Care Guide [facility]  [place], [place] [address] Direct Dial: [phone] [email or web address]

## 2023-01-31 NOTE — Progress Notes (Signed)
  Care Coordination Note  01/31/2023 Name: ZERLINE MELCHIOR MRN: [id number] DOB: 08/21/1961  Theodoro Kos is a 62 y.o. year old female who is a primary care patient of Donato Schultz, DO and is actively engaged with the Chronic Care Management team. I reached out to Theodoro Kos by phone today to assist with re-scheduling a follow up visit with the Pharmacist  Follow up plan: Unsuccessful telephone outreach attempt made. A HIPAA compliant phone message was left for the patient providing contact information and requesting a return call.  The care management team will reach out to the patient again over the next 7 days.  If patient returns call to provider office, please advise to call CCM Care Guide Loyde Orth  at [phone]  Penne Lash, RMA Care Guide [facility]  [place], [place] [address] Direct Dial: [phone] [email or web address]

## 2023-02-02 ENCOUNTER — Telehealth: Payer: Self-pay | Admitting: *Deleted

## 2023-02-02 ENCOUNTER — Ambulatory Visit: Payer: Medicare Other

## 2023-02-02 NOTE — Telephone Encounter (Signed)
2nd attempt to reach pt for pre-visit. Was not able to leave message since call went dead as if It was hung up.

## 2023-02-02 NOTE — Progress Notes (Unsigned)
Pt's pre-visit is done over the phone and all paperwork (prep instructions) sent to patient. Pt's name and DOB verified at the beginning of the pre-visit. Pt denies any difficulty with ambulating.  No egg or soy allergy known to patient  No issues known to pt with past sedation with any surgeries or procedures Pt denies having issues being intubated Patient denies ever being intubated Pt has no issues moving head neck or swallowing No FH of Malignant Hyperthermia Pt is not on diet pills Pt is not on home 02  Pt is not on blood thinners  Pt denies issues with constipation  Pt has frequent issues with constipation RN instructed pt to use Miralax per bottles instructions a week before prep days. Pt states they will Pt is not on dialysis Pt denies any upcoming cardiac testing Pt encouraged to use to use Singlecare or Goodrx to reduce cost  Patient's chart reviewed by John Nulty CNRA prior to pre-visit and patient appropriate for the [facility].  Pre-visit completed and red dot placed by patient's name on their procedure day (on provider's schedule).  . Visit by phone Pt states  weight is  Instructions reviewed with pt and pt states understanding. Instructed to review again prior to procedure. Pt states they will.  Instructions sent by mail with coupon and by my chart 

## 2023-02-02 NOTE — Telephone Encounter (Signed)
Attempt to reach pt for pre-visit appointment. LM with # for pt to call back. Will attempt to reach pt in 10 min.

## 2023-02-08 NOTE — Progress Notes (Signed)
  Care Coordination Note  02/08/2023 Name: Heather Walker MRN: [id number] DOB: Dec 01, 1960  Heather Walker is a 62 y.o. year old female who is a primary care patient of Donato Schultz, DO and is actively engaged with the Chronic Care Management team. I reached out to Heather Walker by phone today to assist with re-scheduling a follow up visit with the Pharmacist  Follow up plan: Telephone appointment with care management team member scheduled for:02/15/2023   Penne Lash, RMA Care Guide [facility]  [place], [place] [address] Direct Dial: [phone] [email or web address]

## 2023-02-09 ENCOUNTER — Ambulatory Visit (AMBULATORY_SURGERY_CENTER): Payer: Medicare Other

## 2023-02-09 ENCOUNTER — Encounter: Payer: Self-pay | Admitting: Gastroenterology

## 2023-02-09 VITALS — Ht 63.0 in | Wt 183.0 lb

## 2023-02-09 DIAGNOSIS — Z1211 Encounter for screening for malignant neoplasm of colon: Secondary | ICD-10-CM

## 2023-02-09 DIAGNOSIS — Z800 Family history of malignant neoplasm of digestive organs: Secondary | ICD-10-CM

## 2023-02-09 MED ORDER — SUTAB 1479-225-188 MG PO TABS: 12.0000 | ORAL_TABLET | ORAL | 0 refills | Status: DC

## 2023-02-09 NOTE — Progress Notes (Signed)

## 2023-02-13 ENCOUNTER — Encounter: Payer: Medicare Other | Admitting: Gastroenterology

## 2023-02-15 ENCOUNTER — Telehealth: Payer: Self-pay | Admitting: Pharmacist

## 2023-02-15 ENCOUNTER — Telehealth: Payer: Medicare Other

## 2023-02-15 NOTE — Telephone Encounter (Signed)
Unsuccessful attempt to reach patient for medication and hypertension follow up with Clinical Pharmacist Practitioner. This was the second missed phone appointment.   LM ln VM with CB# [phone] or [phone]

## 2023-02-22 ENCOUNTER — Telehealth: Payer: Self-pay | Admitting: Pharmacist

## 2023-02-22 ENCOUNTER — Ambulatory Visit (INDEPENDENT_AMBULATORY_CARE_PROVIDER_SITE_OTHER): Payer: Self-pay | Admitting: Pharmacist

## 2023-02-22 DIAGNOSIS — I10 Essential (primary) hypertension: Secondary | ICD-10-CM

## 2023-02-22 DIAGNOSIS — E1165 Type 2 diabetes mellitus with hyperglycemia: Secondary | ICD-10-CM

## 2023-02-22 MED ORDER — PRAVASTATIN SODIUM 40 MG PO TABS: 40.0000 mg | ORAL_TABLET | Freq: Every day | ORAL | 0 refills | Status: DC

## 2023-02-22 MED ORDER — TRULICITY 3 MG/0.5ML ~~LOC~~ SOAJ: 3.0000 mg | SUBCUTANEOUS | 3 refills | Status: DC

## 2023-02-22 MED ORDER — AMLODIPINE BESYLATE 10 MG PO TABS: 10.0000 mg | ORAL_TABLET | Freq: Every day | ORAL | 0 refills | Status: DC

## 2023-02-22 MED ORDER — ONETOUCH VERIO W/DEVICE KIT: PACK | 0 refills | Status: AC

## 2023-02-22 MED ORDER — TRIAMTERENE-HCTZ 37.5-25 MG PO TABS: 1.0000 | ORAL_TABLET | Freq: Every day | ORAL | 0 refills | Status: DC

## 2023-02-22 MED ORDER — CLONIDINE HCL 0.1 MG PO TABS: ORAL_TABLET | ORAL | 0 refills | Status: DC

## 2023-02-22 MED ORDER — INSULIN LISPRO PROT & LISPRO (75-25 MIX) 100 UNIT/ML KWIKPEN: 25.0000 [IU] | PEN_INJECTOR | Freq: Two times a day (BID) | SUBCUTANEOUS | 0 refills | Status: AC

## 2023-02-22 MED ORDER — ONETOUCH VERIO VI STRP: ORAL_STRIP | 0 refills | Status: AC

## 2023-02-22 NOTE — Patient Instructions (Signed)
Ms Giesel,  It was a pleasure speaking with you today.   I have sent in refills for your blood pressure and diabetes medications as well as pravastatin.   Restart checking your blood glucose 1 or 2 times a day  Below are your goals   Fasting blood glucose goal (before meals) = 80 to 130 Blood glucose goal after a meal = less than 180   Please complete the enclosed medication assistance program application for Trulicity and Humalog Mix insulin. Returns to Dr [facility] office.   As always if you have any questions or concerns especially regarding medications, please feel free to contact me either at the phone number below or with a MyChart message.    Henrene Pastor, PharmD Clinical Pharmacist [facility] Primary [facility] [phone] (direct line)  [phone] (main office number)   The patient verbalized understanding of instructions, educational materials, and care plan provided today and agreed to receive a mailed copy of patient instructions, educational materials, and care plan.

## 2023-02-22 NOTE — Progress Notes (Signed)
Pharmacy Note  02/22/2023 Name: LAVOLA CUTCHER MRN: [id number] DOB: [birth year]  Subjective: Heather Walker is a 62 y.o. year old female who is a primary care patient of Zola Button, Grayling Congress, DO. Clinical Pharmacist Practitioner referral was placed to assist with medication management.    Engaged with patient by telephone for follow up visit today.  Type 2 DM:  Managed by Dr Lonzo Cloud Current therapy: Trulicity 3mg  weekly and Humalog Mix 75/25 - 25 units twice a day.  Denies hypoglycemia.  Reports she has not been testing blood glucose because her One Touch Verio meter is not working.  Noted that Endo office left patient a message on MyChart about reapplying for [facility] medication assistance program on 08/16/2022. Patient states she has not completed because she has not received application in mail yet. I mailed applications to her again 10/2022 but today patient states she did not receive in mail.  Hypertension:  Current medications for blood pressure  include amlodipine, clonidine and triamterene-hydrochlorothiazide.  Patient does not check blood pressure at home. It appears that she has not refill several of her blood pressure medications recently but patient endorses that she has been taking meds as directed   Office blood pressure's have been at goal:   BP Readings from Last 3 Encounters:  11/15/22 130/80  10/07/22 139/86  04/04/22 138/82     Hyperlipidemia: patient report she is taking pravastatin daily. Filled 90 days 01/04/20204  She missed appt with Dr Zola Button November 2023 to recheck labs.  Unable to take rosuvastatin - anaphylaxis or atorvastatin - caused neck stiffness.   Low vitamin D and B12: Last checked 05/2020 - B12 was low normal at 264. Took injections for awhile. After our last visit she did start over-the-counter B12 daily. Last Vitamin D was 12. Prescribed weekly vitamin D in past but patient never started due to cost. She was unsure why.  She  has not started over-the-counter Vitamin D as recommended at last visit 07/11/2022  Low serum Magnesium :  Patient is taking magnesium supplement.  .  Objective: Review of patient status, including review of consultants reports, laboratory and other test data, was performed as part of comprehensive evaluation and provision of chronic care management services.   Lab Results  Component Value Date   CREATININE 0.76 11/15/2022   CREATININE 0.90 10/06/2022   CREATININE 0.95 02/15/2022    Lab Results  Component Value Date   HGBA1C 7.6 (H) 11/15/2022       Component Value Date/Time   CHOL 161 11/15/2022 1332   TRIG 113.0 11/15/2022 1332   HDL 68.50 11/15/2022 1332   CHOLHDL 2 11/15/2022 1332   VLDL 22.6 11/15/2022 1332   LDLCALC 70 11/15/2022 1332   LDLCALC 89 07/16/2020 1320   LDLDIRECT 115.0 08/02/2018 1525     Clinical ASCVD: Yes  The 10-year ASCVD risk score (Arnett DK, et al., 2019) is: 11.9%   Values used to calculate the score:     Age: 80 years     Sex: Female     Is Non-Hispanic African American: Yes     Diabetic: Yes     Tobacco smoker: No     Systolic Blood Pressure: 130 mmHg     Is BP treated: Yes     HDL Cholesterol: 68.5 mg/dL     Total Cholesterol: 161 mg/dL    BP Readings from Last 3 Encounters:  11/15/22 130/80  10/07/22 139/86  04/04/22 138/82  Allergies  Allergen Reactions   Crestor [Rosuvastatin Calcium] Anaphylaxis   Metformin And Related Diarrhea    Nausea and diarrhea   Atorvastatin Other (See Comments)    Neck stiffness   Codeine Itching    Medications Reviewed Today     Reviewed by Henrene Pastor, RPH-CPP (Pharmacist) on 02/22/23 at 1604  Med List Status: <None>   Medication Order Taking? Sig Documenting Provider Last Dose Status Informant  albuterol (VENTOLIN HFA) 108 (90 Base) MCG/ACT inhaler [id number]  Inhale 2 puffs into the lungs every 4 (four) hours as needed for wheezing or shortness of breath (or coughing). Dione Booze,  MD  Active   amLODipine (NORVASC) 10 MG tablet [id number] Yes TAKE 1 TABLET(10 MG) BY MOUTH DAILY Zola Button, Grayling Congress, DO Taking Active   Blood Glucose Monitoring Suppl (ONETOUCH VERIO) w/Device KIT [id number] No Use to check blood sugar 2 times daily  Patient not taking: Reported on 02/22/2023   Shamleffer, Konrad Dolores, MD Not Taking Active   cholecalciferol (VITAMIN D3) 25 MCG (1000 UNIT) tablet [id number] No Take 2,000 Units by mouth daily.  Patient not taking: Reported on 02/22/2023   [provider] Not Taking Active   cloNIDine (CATAPRES) 0.1 MG tablet [id number] Yes TAKE 1 TABLET(0.1 MG) BY MOUTH TWICE DAILY Seabron Spates R, DO Taking Active   cyanocobalamin (VITAMIN B12) 1000 MCG tablet [id number] No Take 1,000 mcg by mouth daily.  Patient not taking: Reported on 02/09/2023   [provider] Not Taking Active   cyclobenzaprine (FLEXERIL) 5 MG tablet [id number]  Take 1 tablet (5 mg total) by mouth 3 (three) times daily as needed for muscle spasms. Zola Button, Grayling Congress, DO  Active   Dulaglutide (TRULICITY) 3 MG/0.5ML Namon Cirri [id number] Yes Inject 3 mg as directed once a week. Shamleffer, Konrad Dolores, MD Taking Active            Med Note Marius Ditch Jul 11, 2022 11:34 AM)    Insulin Lispro Prot & Lispro (HUMALOG MIX 75/25 KWIKPEN) (75-25) 100 UNIT/ML Stephanie Coup [id number] Yes Inject 25 Units into the skin 2 (two) times daily. Shamleffer, Konrad Dolores, MD Taking Active   Insulin Pen Needle 32G X 4 MM MISC [id number]  Inject 1 Device into the skin in the morning and at bedtime. Use as instructed to inject insulin daily Shamleffer, Konrad Dolores, MD  Active   Lancets [facility] Larose [place] PLUS [place]) MISC [id number]  Use as instructed to check blood sugar 2 times daily Shamleffer, Konrad Dolores, MD  Active   magnesium 30 MG tablet [id number] Yes Take 1 tablet (30 mg total) by mouth daily. Dione Booze, MD Taking Active   [facility] VERIO test strip [id number] Yes Use  as instructed to check blood sugar 2 times daily (Dx: E11.9, Z79.1- type 2 DM with long term insulin use) Zola Button, Grayling Congress, DO Taking Active   pravastatin (PRAVACHOL) 40 MG tablet [id number] Yes Take 1 tablet (40 mg total) by mouth daily. Seabron Spates R, DO Taking Active   Sodium Sulfate-Mag Sulfate-KCl (SUTAB) [phone] MG TABS [id number] No Take 12 tablets by mouth as directed. If insurance does not cover please use coupon to discount BIN [id number] PCN [id number] GROUP [id number] MEMBER [id number]  Patient not taking: Reported on 02/22/2023   Napoleon Form, MD Not Taking Active   triamcinolone cream (KENALOG) 0.1 % [id number]  Apply 1 application topically 2 (two) times daily.  Patient not taking: Reported on 02/09/2023   Zola Button, Myrene Buddy  R, DO  Active Other  triamterene-hydrochlorothiazide (MAXZIDE-25) 37.5-25 MG tablet [id number] Yes Take 1 tablet by mouth daily. Donato Schultz, DO Taking Active   Vitamin D, Ergocalciferol, (DRISDOL) 1.25 MG (50000 UNIT) CAPS capsule [id number] No Take 1 capsule (50,000 Units total) by mouth every 7 (seven) days.  Patient not taking: Reported on 02/09/2023   Donato Schultz, DO Not Taking Active             Patient Active Problem List   Diagnosis Date Noted   Unspecified adrenocortical insufficiency (HCC) 02/21/2022   Type 2 diabetes mellitus with diabetic peripheral angiopathy without gangrene, without long-term current use of insulin (HCC) 08/20/2021   Hyperlipidemia 07/16/2020   Dizziness 04/15/2020   BMI 35.0-35.9,adult 04/15/2020   Weakness 04/15/2020   Abnormal cortisol level 04/14/2020   SIRS (systemic inflammatory response syndrome) (HCC) 04/06/2020   Pneumonia due to COVID-19 virus 05/14/2019   COVID-19 virus infection 05/13/2019   Renal insufficiency 03/26/2019   At risk for cardiovascular event 03/26/2019   Allergic reaction due to correct medicinal substance properly administered 08/23/2018   Hypertriglyceridemia  08/05/2018   Asthma, chronic, unspecified asthma severity, uncomplicated 08/05/2018   Chronic right shoulder pain 08/05/2018   Muscle spasm 08/05/2018   Insomnia 10/05/2017   Sepsis (HCC) 08/21/2017   Acute right hip pain 08/21/2017   Preventative health care 11/20/2016   Right ankle pain 05/09/2016   Palpitations 12/08/2014   DM (diabetes mellitus) type II uncontrolled, periph vascular disorder 08/03/2014   Leg wound, right 08/03/2014   Hyperglycemia 07/13/2014   Chest pain 07/12/2014   Diabetes mellitus (HCC) 07/12/2014   Acute kidney injury (HCC) 07/12/2014   Abdominal pain, right upper quadrant 05/28/2014   Tachycardia 05/03/2013   Abscess 11/13/2012   Breast pain, right 08/10/2012   Osteoarthritis of left shoulder 03/04/2011   BACK PAIN, LUMBAR, WITH RADICULOPATHY 09/17/2009   CALF PAIN, RIGHT 09/17/2009   RENAL CYST 08/28/2009   LOW BACK PAIN, ACUTE 11/13/2008   FIBROIDS, UTERUS 09/10/2007   UTI 09/03/2007   BACTERIAL VAGINITIS 09/03/2007   Flank pain 09/03/2007   Hypokalemia 09/03/2007   MOLE 06/21/2007   ALLERGIC RHINITIS 06/21/2007   HEMOCCULT POSITIVE STOOL 06/21/2007   SINUSITIS, ACUTE NOS 04/05/2007   REFLUX, ESOPHAGEAL 04/05/2007   CHEST PAIN 04/05/2007   Essential hypertension 03/19/2007   ALLERGIC RHINITIS, SEASONAL 03/09/2007   Asthma 03/09/2007     Medication Assistance:  None required.  Patient affirms current coverage meets needs.   Assessment / Plan:   Type 2 DM: A1c not quite at goal of < 7.0% Continue current therapy Trulicity 3mg  weekly and Humalog Mix 75/25 - 25 units twice a day.  Restart checking blood glucose up to twice a day. Sent in Rx for new glucometer and test stips. Patient endorses she has plenty of lancets.  Reviewed blood glucose goal. Reviewed home blood glucose readings and reviewed goals  Fasting blood glucose goal (before meals) = 80 to 130 Blood glucose goal after a meal = less than 180    Mailed patient application for  [employer] again.  She will return to Dr [facility] office once she completes patient portion.   Hypertension: blood pressure at goal.  Continue amlodipine, clonidine and triamterene-hydrochlorothiazide.   Hyperlipidemia: Last LDL at goal of < 100.  Continue pravastatin 40mg  daily. Made appointment for with PCP for July 2024 (sees PCP every 6 months)    Low vitamin D and B12:  Recheck B12 and vitamin D at  upcoming appt with PCP  Low serum Magnesium and potassium:  Continue magnesium 30mg  daily Recheck magnesium and potassium at next appt with PCP  Medication management:  Reviewed med list and updated Reviewed refill history and discussed adherence Updated maintenance medication Rx to last until appt with PCP.   Meds ordered this encounter  Medications   amLODipine (NORVASC) 10 MG tablet    Sig: Take 1 tablet (10 mg total) by mouth daily.    Dispense:  90 tablet    Refill:  0   cloNIDine (CATAPRES) 0.1 MG tablet    Sig: TAKE 1 TABLET(0.1 MG) BY MOUTH TWICE DAILY    Dispense:  180 tablet    Refill:  0   triamterene-hydrochlorothiazide (MAXZIDE-25) 37.5-25 MG tablet    Sig: Take 1 tablet by mouth daily.    Dispense:  90 tablet    Refill:  0   ONETOUCH VERIO test strip    Sig: Use as instructed to check blood sugar 2 times daily (Dx: E11.9, Z79.1- type 2 DM with long term insulin use)    Dispense:  100 each    Refill:  0   Blood Glucose Monitoring Suppl (ONETOUCH VERIO) w/Device KIT    Sig: Use to check blood sugar 2 times daily. (Dx: E11.9, Z79.1- type 2 DM with long term insulin use)    Dispense:  1 kit    Refill:  0   Dulaglutide (TRULICITY) 3 MG/0.5ML SOPN    Sig: Inject 3 mg as directed once a week.    Dispense:  6 mL    Refill:  3   Insulin Lispro Prot & Lispro (HUMALOG MIX 75/25 KWIKPEN) (75-25) 100 UNIT/ML Kwikpen    Sig: Inject 25 Units into the skin 2 (two) times daily.    Dispense:  45 mL    Refill:  0   pravastatin (PRAVACHOL) 40 MG tablet    Sig: Take 1  tablet (40 mg total) by mouth daily.    Dispense:  90 tablet    Refill:  0    Follow Up:  Telephone follow up appointment with care management team member scheduled for:  1 to 2  months.    Henrene Pastor, PharmD Clinical Pharmacist [facility] Primary Care  - [facility] [phone]

## 2023-02-22 NOTE — Telephone Encounter (Signed)
Opened in error

## 2023-02-24 ENCOUNTER — Encounter: Payer: Self-pay | Admitting: Gastroenterology

## 2023-03-02 ENCOUNTER — Encounter: Payer: Medicare Other | Admitting: Gastroenterology

## 2023-04-10 LAB — HM DIABETES EYE EXAM

## 2023-04-11 ENCOUNTER — Ambulatory Visit: Payer: Medicare Other

## 2023-04-11 VITALS — Ht 63.0 in | Wt 185.0 lb

## 2023-04-11 DIAGNOSIS — Z1211 Encounter for screening for malignant neoplasm of colon: Secondary | ICD-10-CM

## 2023-04-11 DIAGNOSIS — Z800 Family history of malignant neoplasm of digestive organs: Secondary | ICD-10-CM

## 2023-04-11 NOTE — Progress Notes (Signed)
No egg or soy allergy known to patient  No issues known to pt with past sedation with any surgeries or procedures Patient denies ever being told they had issues or difficulty with intubation  No FH of Malignant Hyperthermia Pt is not on diet pills Pt is not on  home 02  Pt is not on blood thinners  Pt has issues with constipation  No A fib or A flutter Have any cardiac testing pending--no Pt instructed to use Singlecare.com or GoodRx for a price reduction on prep   Patient's chart reviewed by Cathlyn Parsons CNRA prior to previsit and patient appropriate for the [facility].  Previsit completed and red dot placed by patient's name on their procedure day (on provider's schedule).   Can ambulate independently

## 2023-04-12 ENCOUNTER — Encounter: Payer: Self-pay | Admitting: Gastroenterology

## 2023-05-01 ENCOUNTER — Ambulatory Visit: Payer: Medicare Other | Admitting: Family Medicine

## 2023-05-07 ENCOUNTER — Encounter: Payer: Self-pay | Admitting: Certified Registered Nurse Anesthetist

## 2023-05-09 ENCOUNTER — Encounter: Payer: Self-pay | Admitting: Gastroenterology

## 2023-05-09 ENCOUNTER — Ambulatory Visit (AMBULATORY_SURGERY_CENTER): Payer: Medicare Other | Admitting: Gastroenterology

## 2023-05-09 VITALS — BP 148/88 | HR 73 | Temp 98.0°F | Resp 12 | Ht 63.0 in | Wt 185.0 lb

## 2023-05-09 DIAGNOSIS — Z1211 Encounter for screening for malignant neoplasm of colon: Secondary | ICD-10-CM

## 2023-05-09 DIAGNOSIS — D124 Benign neoplasm of descending colon: Secondary | ICD-10-CM | POA: Diagnosis not present

## 2023-05-09 DIAGNOSIS — Z800 Family history of malignant neoplasm of digestive organs: Secondary | ICD-10-CM

## 2023-05-09 DIAGNOSIS — E119 Type 2 diabetes mellitus without complications: Secondary | ICD-10-CM | POA: Diagnosis not present

## 2023-05-09 DIAGNOSIS — I10 Essential (primary) hypertension: Secondary | ICD-10-CM | POA: Diagnosis not present

## 2023-05-09 DIAGNOSIS — D125 Benign neoplasm of sigmoid colon: Secondary | ICD-10-CM | POA: Diagnosis not present

## 2023-05-09 MED ORDER — SODIUM CHLORIDE 0.9 % IV SOLN: 500.0000 mL | Freq: Once | INTRAVENOUS | Status: AC

## 2023-05-09 NOTE — Progress Notes (Unsigned)
[facility] History and Physical   Primary Care Physician:  Zola Button, Grayling Congress, DO   Reason for Procedure:  Family history of colon cancer  Plan:    Screening colonoscopy with possible interventions as needed     HPI: Heather Walker is a very pleasant 62 y.o. female here for screening colonoscopy. Denies any nausea, vomiting, abdominal pain, melena or bright red blood per rectum  The risks and benefits as well as alternatives of endoscopic procedure(s) have been discussed and reviewed. All questions answered. The patient agrees to proceed.    Past Medical History:  Diagnosis Date   Allergy    Anemia    Anxiety    Arthritis    Asthma    Diabetes mellitus without complication (HCC)    Heart murmur    Hypertension     Past Surgical History:  Procedure Laterality Date   ABDOMINAL HYSTERECTOMY     APPENDECTOMY     BLADDER SUSPENSION     CESAREAN SECTION     3 previous   TEE WITHOUT CARDIOVERSION N/A 08/24/2017   Procedure: TRANSESOPHAGEAL ECHOCARDIOGRAM (TEE);  Surgeon: Laurey Morale, MD;  Location: [facility] ENDOSCOPY;  Service: Cardiovascular;  Laterality: N/A;   TUBAL LIGATION      Prior to Admission medications   Medication Sig Start Date End Date Taking? Authorizing Provider  amLODipine (NORVASC) 10 MG tablet Take 1 tablet (10 mg total) by mouth daily. 02/22/23  Yes Donato Schultz, DO  aspirin EC 81 MG tablet Take 81 mg by mouth daily. Swallow whole.   Yes [provider]  Blood Glucose Monitoring Suppl (ONETOUCH VERIO) w/Device KIT Use to check blood sugar 2 times daily. (Dx: E11.9, Z79.1- type 2 DM with long term insulin use) 02/22/23  Yes Seabron Spates R, DO  cloNIDine (CATAPRES) 0.1 MG tablet TAKE 1 TABLET(0.1 MG) BY MOUTH TWICE DAILY 02/22/23  Yes Seabron Spates R, DO  magnesium 30 MG tablet Take 1 tablet (30 mg total) by mouth daily. 10/07/22  Yes Dione Booze, MD  pravastatin (PRAVACHOL) 40 MG tablet Take 1 tablet (40 mg total)  by mouth daily. 02/22/23  Yes Donato Schultz, DO  triamterene-hydrochlorothiazide (MAXZIDE-25) 37.5-25 MG tablet Take 1 tablet by mouth daily. 02/22/23  Yes Seabron Spates R, DO  albuterol (VENTOLIN HFA) 108 (90 Base) MCG/ACT inhaler Inhale 2 puffs into the lungs every 4 (four) hours as needed for wheezing or shortness of breath (or coughing). 10/07/22   Dione Booze, MD  cholecalciferol (VITAMIN D3) 25 MCG (1000 UNIT) tablet Take 2,000 Units by mouth daily. Patient not taking: Reported on 02/22/2023    [provider]  cyanocobalamin (VITAMIN B12) 1000 MCG tablet Take 1,000 mcg by mouth daily. Patient not taking: Reported on 02/09/2023    [provider]  cyclobenzaprine (FLEXERIL) 5 MG tablet Take 1 tablet (5 mg total) by mouth 3 (three) times daily as needed for muscle spasms. Patient not taking: Reported on 05/09/2023 05/06/22   Zola Button, Grayling Congress, DO  Dulaglutide (TRULICITY) 3 MG/0.5ML SOPN Inject 3 mg as directed once a week. Patient not taking: Reported on 05/09/2023 02/22/23   Zola Button, Grayling Congress, DO  Insulin Lispro Prot & Lispro (HUMALOG MIX 75/25 KWIKPEN) (75-25) 100 UNIT/ML Kwikpen Inject 25 Units into the skin 2 (two) times daily. Patient not taking: Reported on 05/09/2023 02/22/23   Zola Button, Grayling Congress, DO  Insulin Pen Needle 32G X 4 MM MISC Inject 1 Device into  the skin in the morning and at bedtime. Use as instructed to inject insulin daily Patient not taking: Reported on 05/09/2023 04/04/22   Shamleffer, Konrad Dolores, MD  Lancets [facility] DELICA PLUS [place]) MISC Use as instructed to check blood sugar 2 times daily Patient not taking: Reported on 05/09/2023 02/23/21   Shamleffer, Konrad Dolores, MD  [facility] VERIO test strip Use as instructed to check blood sugar 2 times daily (Dx: E11.9, Z79.1- type 2 DM with long term insulin use) Patient not taking: Reported on 05/09/2023 02/22/23   Seabron Spates R, DO  triamcinolone cream (KENALOG) 0.1 % Apply 1  application topically 2 (two) times daily. Patient not taking: Reported on 02/09/2023 03/03/20   Donato Schultz, DO  Vitamin D, Ergocalciferol, (DRISDOL) 1.25 MG (50000 UNIT) CAPS capsule Take 1 capsule (50,000 Units total) by mouth every 7 (seven) days. Patient not taking: Reported on 02/09/2023 11/16/22   Donato Schultz, DO    Current Outpatient Medications  Medication Sig Dispense Refill   amLODipine (NORVASC) 10 MG tablet Take 1 tablet (10 mg total) by mouth daily. 90 tablet 0   aspirin EC 81 MG tablet Take 81 mg by mouth daily. Swallow whole.     Blood Glucose Monitoring Suppl (ONETOUCH VERIO) w/Device KIT Use to check blood sugar 2 times daily. (Dx: E11.9, Z79.1- type 2 DM with long term insulin use) 1 kit 0   cloNIDine (CATAPRES) 0.1 MG tablet TAKE 1 TABLET(0.1 MG) BY MOUTH TWICE DAILY 180 tablet 0   magnesium 30 MG tablet Take 1 tablet (30 mg total) by mouth daily. 15 tablet 0   pravastatin (PRAVACHOL) 40 MG tablet Take 1 tablet (40 mg total) by mouth daily. 90 tablet 0   triamterene-hydrochlorothiazide (MAXZIDE-25) 37.5-25 MG tablet Take 1 tablet by mouth daily. 90 tablet 0   albuterol (VENTOLIN HFA) 108 (90 Base) MCG/ACT inhaler Inhale 2 puffs into the lungs every 4 (four) hours as needed for wheezing or shortness of breath (or coughing). 1 each 0   cholecalciferol (VITAMIN D3) 25 MCG (1000 UNIT) tablet Take 2,000 Units by mouth daily. (Patient not taking: Reported on 02/22/2023)     cyanocobalamin (VITAMIN B12) 1000 MCG tablet Take 1,000 mcg by mouth daily. (Patient not taking: Reported on 02/09/2023)     cyclobenzaprine (FLEXERIL) 5 MG tablet Take 1 tablet (5 mg total) by mouth 3 (three) times daily as needed for muscle spasms. (Patient not taking: Reported on 05/09/2023) 30 tablet 1   Dulaglutide (TRULICITY) 3 MG/0.5ML SOPN Inject 3 mg as directed once a week. (Patient not taking: Reported on 05/09/2023) 6 mL 3   Insulin Lispro Prot & Lispro (HUMALOG MIX 75/25 KWIKPEN) (75-25)  100 UNIT/ML Kwikpen Inject 25 Units into the skin 2 (two) times daily. (Patient not taking: Reported on 05/09/2023) 45 mL 0   Insulin Pen Needle 32G X 4 MM MISC Inject 1 Device into the skin in the morning and at bedtime. Use as instructed to inject insulin daily (Patient not taking: Reported on 05/09/2023) 200 each 3   Lancets (ONETOUCH DELICA PLUS LANCET33G) MISC Use as instructed to check blood sugar 2 times daily (Patient not taking: Reported on 05/09/2023) 100 each 6   ONETOUCH VERIO test strip Use as instructed to check blood sugar 2 times daily (Dx: E11.9, Z79.1- type 2 DM with long term insulin use) (Patient not taking: Reported on 05/09/2023) 100 each 0   triamcinolone cream (KENALOG) 0.1 % Apply 1 application topically 2 (two) times  daily. (Patient not taking: Reported on 02/09/2023) 30 g 0   Vitamin D, Ergocalciferol, (DRISDOL) 1.25 MG (50000 UNIT) CAPS capsule Take 1 capsule (50,000 Units total) by mouth every 7 (seven) days. (Patient not taking: Reported on 02/09/2023) 12 capsule 1   Current Facility-Administered Medications  Medication Dose Route Frequency Provider Last Rate Last Admin   0.9 %  sodium chloride infusion  500 mL Intravenous Once Napoleon Form, MD        Allergies as of 05/09/2023 - Review Complete 05/09/2023  Allergen Reaction Noted   Crestor [rosuvastatin calcium] Anaphylaxis 08/23/2018   Metformin and related Diarrhea 09/18/2014   Atorvastatin Other (See Comments) 12/05/2016   Codeine Itching     Family History  Problem Relation Age of Onset   Hypertension Mother    Asthma Mother    Hypertension Sister    Asthma Sister    Diabetes Sister    Heart defect Sister    Asthma Sister    Cancer Sister    Kidney disease Sister    Aneurysm Sister    Asthma Sister    Colon cancer Brother 32   Hypertension Maternal Grandmother    Colon polyps Neg Hx    Esophageal cancer Neg Hx    Rectal cancer Neg Hx    Stomach cancer Neg Hx     Social History    Socioeconomic History   Marital status: Married    Spouse name: Not on file   Number of children: Not on file   Years of education: ged   Highest education level: Not on file  Occupational History   Occupation: disabled  Tobacco Use   Smoking status: Never   Smokeless tobacco: Never  Vaping Use   Vaping status: Never Used  Substance and Sexual Activity   Alcohol use: No   Drug use: No   Sexual activity: Yes    Partners: Male    Birth control/protection: Surgical  Other Topics Concern   Not on file  Social History Narrative   Exercise - watching 2 toddlers    Social Determinants of Health   Financial Resource Strain: Medium Risk (04/18/2022)   Overall Financial Resource Strain (CARDIA)    Difficulty of Paying Living Expenses: Somewhat hard  Food Insecurity: No Food Insecurity (07/13/2021)   Hunger Vital Sign    Worried About Running Out of Food in the Last Year: Never true    Ran Out of Food in the Last Year: Never true  Transportation Needs: No Transportation Needs (07/13/2021)   PRAPARE - Administrator, Civil Service (Medical): No    Lack of Transportation (Non-Medical): No  Physical Activity: Insufficiently Active (11/04/2021)   Exercise Vital Sign    Days of Exercise per Week: 3 days    Minutes of Exercise per Session: 30 min  Stress: No Stress Concern Present (07/13/2021)   [employer] of Occupational Health - Occupational Stress Questionnaire    Feeling of Stress : Not at all  Social Connections: Moderately Integrated (07/13/2021)   Social Connection and Isolation Panel [NHANES]    Frequency of Communication with Friends and Family: More than three times a week    Frequency of Social Gatherings with Friends and Family: More than three times a week    Attends Religious Services: More than 4 times per year    Active Member of [employer] or Organizations: No    Attends Banker Meetings: Never    Marital Status: Married  Catering manager  Violence: Not At Risk (07/13/2021)   Humiliation, Afraid, Rape, and Kick questionnaire    Fear of Current or Ex-Partner: No    Emotionally Abused: No    Physically Abused: No    Sexually Abused: No    Review of Systems:  All other review of systems negative except as mentioned in the HPI.  Physical Exam: Vital signs in last 24 hours: BP (!) 160/102   Pulse 85   Temp 98 F (36.7 C) (Skin)   Ht 5\' 3"  (1.6 m)   Wt 185 lb (83.9 kg)   SpO2 97%   BMI 32.77 kg/m  General:   Alert, NAD Lungs:  Clear .   Heart:  Regular rate and rhythm Abdomen:  Soft, nontender and nondistended. Neuro/Psych:  Alert and cooperative. Normal mood and affect. A and O x 3  Reviewed labs, radiology imaging, old records and pertinent past GI work up  Patient is appropriate for planned procedure(s) and anesthesia in an ambulatory setting   K. Scherry Ran , MD [phone]

## 2023-05-09 NOTE — Progress Notes (Signed)
Report given to PACU, vss 

## 2023-05-09 NOTE — Op Note (Signed)
[facility] Patient Name: Heather Walker Procedure Date: 05/09/2023 11:56 AM MRN: [id number] Endoscopist: Napoleon Form , MD, [id number] Age: 62 Referring MD:  Date of Birth: 04/11/1961 Gender: Female Account #: [id number] Procedure:                Colonoscopy Indications:              Screening in patient at increased risk: Colorectal                            cancer in brother before age 12 Medicines:                Monitored Anesthesia Care Procedure:                Pre-Anesthesia Assessment:                           - Prior to the procedure, a History and Physical                            was performed, and patient medications and                            allergies were reviewed. The patient's tolerance of                            previous anesthesia was also reviewed. The risks                            and benefits of the procedure and the sedation                            options and risks were discussed with the patient.                            All questions were answered, and informed consent                            was obtained. Prior Anticoagulants: The patient has                            taken no anticoagulant or antiplatelet agents. ASA                            Grade Assessment: II - A patient with mild systemic                            disease. After reviewing the risks and benefits,                            the patient was deemed in satisfactory condition to                            undergo the procedure.  After obtaining informed consent, the colonoscope                            was passed under direct vision. Throughout the                            procedure, the patient's blood pressure, pulse, and                            oxygen saturations were monitored continuously. The                            Olympus Scope SN: [id number] was introduced through                            the anus and  advanced to the the cecum, identified                            by appendiceal orifice and ileocecal valve. The                            colonoscopy was performed without difficulty. The                            patient tolerated the procedure well. The quality                            of the bowel preparation was good. The ileocecal                            valve, appendiceal orifice, and rectum were                            photographed. Scope In: 11:59:47 AM Scope Out: 12:15:32 PM Scope Withdrawal Time: 0 hours 11 minutes 19 seconds  Total Procedure Duration: 0 hours 15 minutes 45 seconds  Findings:                 The perianal and digital rectal examinations were                            normal.                           A 12 mm polyp was found in the descending colon.                            The polyp was sessile. The polyp was removed with a                            cold snare. Resection and retrieval were complete.                            To prevent bleeding after the polypectomy, two  hemostatic clips were successfully placed. Clip                            manufacturer: [employer]. There was no                            bleeding at the end of the procedure.                           A 5 mm polyp was found in the sigmoid colon. The                            polyp was sessile. The polyp was removed with a                            cold snare. Resection and retrieval were complete.                           Scattered small-mouthed diverticula were found in                            the sigmoid colon, descending colon and ascending                            colon.                           Non-bleeding internal hemorrhoids were found during                            retroflexion. The hemorrhoids were small. Complications:            No immediate complications. Estimated Blood Loss:     Estimated blood loss was  minimal. Impression:               - One 12 mm polyp in the descending colon, removed                            with a cold snare. Resected and retrieved. Clips                            were placed. Clip manufacturer: [employer].                           - One 5 mm polyp in the sigmoid colon, removed with                            a cold snare. Resected and retrieved.                           - Diverticulosis in the sigmoid colon, in the  descending colon and in the ascending colon.                           - Non-bleeding internal hemorrhoids. Recommendation:           - Patient has a contact number available for                            emergencies. The signs and symptoms of potential                            delayed complications were discussed with the                            patient. Return to normal activities tomorrow.                            Written discharge instructions were provided to the                            patient.                           - Resume previous diet.                           - Continue present medications.                           - Await pathology results.                           - Repeat colonoscopy in 3 years for surveillance. Napoleon Form, MD 05/09/2023 12:18:48 PM This report has been signed electronically.

## 2023-05-09 NOTE — Progress Notes (Unsigned)
Called to room to assist during endoscopic procedure.  Patient ID and intended procedure confirmed with present staff. Received instructions for my participation in the procedure from the performing physician.  

## 2023-05-09 NOTE — Progress Notes (Signed)
Pt's states no medical or surgical changes since previsit or office visit. 

## 2023-05-09 NOTE — Patient Instructions (Signed)
YOU HAD AN ENDOSCOPIC PROCEDURE TODAY AT THE [facility]:   Refer to the procedure report that was given to you for any specific questions about what was found during the examination.  If the procedure report does not answer your questions, please call your gastroenterologist to clarify.  If you requested that your care partner not be given the details of your procedure findings, then the procedure report has been included in a sealed envelope for you to review at your convenience later.  YOU SHOULD EXPECT: Some feelings of bloating in the abdomen. Passage of more gas than usual.  Walking can help get rid of the air that was put into your GI tract during the procedure and reduce the bloating. If you had a lower endoscopy (such as a colonoscopy or flexible sigmoidoscopy) you may notice spotting of blood in your stool or on the toilet paper. If you underwent a bowel prep for your procedure, you may not have a normal bowel movement for a few days.  Please Note:  You might notice some irritation and congestion in your nose or some drainage.  This is from the oxygen used during your procedure.  There is no need for concern and it should clear up in a day or so.  SYMPTOMS TO REPORT IMMEDIATELY:  Following lower endoscopy (colonoscopy or flexible sigmoidoscopy):  Excessive amounts of blood in the stool  Significant tenderness or worsening of abdominal pains  Swelling of the abdomen that is new, acute  Fever of 100F or higher   For urgent or emergent issues, a gastroenterologist can be reached at any hour by calling [phone]. Do not use MyChart messaging for urgent concerns.    DIET:  We do recommend a small meal at first, but then you may proceed to your regular diet.  Drink plenty of fluids but you should avoid alcoholic beverages for 24 hours.  MEDICATIONS: Continue present medications. No Non-steroidal Anti-inflammatory Drugs (NSAIDS) or fish oil for 2 weeks. Tylenol is OK to  take for pain until then.  FOLLOW UP: Await pathology results. Repeat colonoscopy in 3 years for surveillance.  Please see handouts given to you by your recovery nurse: Polyps, Diverticulosis, Hemorrhoids and Clip card. Keep Clip card in you wallet and inform medical personnel prior to having any MRI/CT scans.  Thank you for allowing [place] to provide for your healthcare needs today.   ACTIVITY:  You should plan to take it easy for the rest of today and you should NOT DRIVE or use heavy machinery until tomorrow (because of the sedation medicines used during the test).    FOLLOW UP: Our staff will call the number listed on your records the next business day following your procedure.  We will call around 7:15- 8:00 am to check on you and address any questions or concerns that you may have regarding the information given to you following your procedure. If we do not reach you, we will leave a message.     If any biopsies were taken you will be contacted by phone or by letter within the next 1-3 weeks.  Please call us at [phone] if you have not heard about the biopsies in 3 weeks.    SIGNATURES/CONFIDENTIALITY: You and/or your care partner have signed paperwork which will be entered into your electronic medical record.  These signatures attest to the fact that that the information above on your After Visit Summary has been reviewed and is understood.  Full responsibility of the  confidentiality of this discharge information lies with you and/or your care-partner.

## 2023-05-10 ENCOUNTER — Encounter: Payer: Self-pay | Admitting: Gastroenterology

## 2023-05-10 ENCOUNTER — Telehealth: Payer: Self-pay

## 2023-05-10 NOTE — Telephone Encounter (Signed)
  Follow up Call-     05/09/2023   11:07 AM 05/09/2023   11:04 AM  Call back number  Post procedure Call Back phone  # [phone]  Permission to leave phone message Yes Yes     Patient questions:  Do you have a fever, pain , or abdominal swelling? No. Pain Score  0 *  Have you tolerated food without any problems? Yes.    Have you been able to return to your normal activities? Yes.    Do you have any questions about your discharge instructions: Diet   No. Medications  No. Follow up visit  No.  Do you have questions or concerns about your Care? No.  Actions: * If pain score is 4 or above: No action needed, pain <4.

## 2023-05-19 ENCOUNTER — Encounter: Payer: Self-pay | Admitting: Gastroenterology

## 2023-05-29 ENCOUNTER — Ambulatory Visit (INDEPENDENT_AMBULATORY_CARE_PROVIDER_SITE_OTHER): Payer: Self-pay | Admitting: Pharmacist

## 2023-05-29 DIAGNOSIS — I10 Essential (primary) hypertension: Secondary | ICD-10-CM

## 2023-05-29 MED ORDER — AMLODIPINE BESYLATE 10 MG PO TABS
10.0000 mg | ORAL_TABLET | Freq: Every day | ORAL | 0 refills | Status: DC
Start: 2023-05-29 — End: 2023-06-28

## 2023-05-29 MED ORDER — TRIAMTERENE-HCTZ 37.5-25 MG PO TABS
1.0000 | ORAL_TABLET | Freq: Every day | ORAL | 0 refills | Status: DC
Start: 2023-05-29 — End: 2023-06-28

## 2023-05-29 MED ORDER — PRAVASTATIN SODIUM 40 MG PO TABS: 40.0000 mg | ORAL_TABLET | Freq: Every day | ORAL | 0 refills | Status: DC

## 2023-05-29 NOTE — Progress Notes (Signed)
Pharmacy Note  05/29/2023 Name: Heather Walker MRN: [id number] DOB: 03/24/1961  Subjective: Heather Walker is a 62 y.o. year old female who is a primary care patient of Zola Button, Grayling Congress, DO. Clinical Pharmacist Practitioner referral was placed to assist with medication management.    Engaged with patient by telephone for follow up visit today.  Type 2 DM:  Managed by Dr Lonzo Cloud Current therapy: Trulicity 3mg  weekly and Humalog Mix 75/25 - 25 units twice a day.  Denies hypoglycemia.  Reports she has been checking blood glucose daily. Ranges from 130 to 160 She has been approved for patient assistance program for Trulicity and Humalog 75/25 thru 09/2023  Hypertension:  Current medications for blood pressure: amlodipine, clonidine and triamterene-hydrochlorothiazide.  Patient is checking blood pressure at home - has been 120's / 70-80's  Last office blood pressure at GI office was not at goal.  BP Readings from Last 3 Encounters:  05/09/23 (!) 148/88  11/15/22 130/80  10/07/22 139/86     Hyperlipidemia:  Taking pravastatin 40mg  daily.  Unable to take rosuvastatin - anaphylaxis or atorvastatin - caused neck stiffness.   Low vitamin D and B12: Last checked 05/2020 - B12 was low normal at 264. Took injections for awhile. She has started over-the-counter B12 daily but reports she has not taken recently because she ran out.  Last Vitamin D was 12. Prescribed weekly vitamin D in past but patient never started due to cost. She also has not started over-the-counter Vitamin D as recommended at last visit 07/11/2022  Low serum Magnesium :  Patient is taking magnesium supplement 30mg  daily.  .  Objective: Review of patient status, including review of consultants reports, laboratory and other test data, was performed as part of comprehensive evaluation and provision of chronic care management services.   Lab Results  Component Value Date   CREATININE 0.76 11/15/2022    CREATININE 0.90 10/06/2022   CREATININE 0.95 02/15/2022    Lab Results  Component Value Date   HGBA1C 7.6 (H) 11/15/2022       Component Value Date/Time   CHOL 161 11/15/2022 1332   TRIG 113.0 11/15/2022 1332   HDL 68.50 11/15/2022 1332   CHOLHDL 2 11/15/2022 1332   VLDL 22.6 11/15/2022 1332   LDLCALC 70 11/15/2022 1332   LDLCALC 89 07/16/2020 1320   LDLDIRECT 115.0 08/02/2018 1525     Clinical ASCVD: Yes  The 10-year ASCVD risk score (Arnett DK, et al., 2019) is: 17%   Values used to calculate the score:     Age: 89 years     Sex: Female     Is Non-Hispanic African American: Yes     Diabetic: Yes     Tobacco smoker: No     Systolic Blood Pressure: 148 mmHg     Is BP treated: Yes     HDL Cholesterol: 68.5 mg/dL     Total Cholesterol: 161 mg/dL    BP Readings from Last 3 Encounters:  05/09/23 (!) 148/88  11/15/22 130/80  10/07/22 139/86     Allergies  Allergen Reactions   Crestor [Rosuvastatin Calcium] Anaphylaxis   Metformin And Related Diarrhea    Nausea and diarrhea   Atorvastatin Other (See Comments)    Neck stiffness   Codeine Itching    Medications Reviewed Today     Reviewed by Henrene Pastor, RPH-CPP (Pharmacist) on 05/29/23 at 1551  Med List Status: <None>   Medication Order Taking? Sig Documenting Provider Last Dose  Status Informant  0.9 %  sodium chloride infusion [id number]   Nandigam, Eleonore Chiquito, MD  Active   albuterol (VENTOLIN HFA) 108 (90 Base) MCG/ACT inhaler [id number] Yes Inhale 2 puffs into the lungs every 4 (four) hours as needed for wheezing or shortness of breath (or coughing). Dione Booze, MD Taking Active   amLODipine (NORVASC) 10 MG tablet [id number] Yes Take 1 tablet (10 mg total) by mouth daily. Seabron Spates R, DO Taking Active   aspirin EC 81 MG tablet [id number] Yes Take 81 mg by mouth daily. Swallow whole. [provider] Taking Active   Blood Glucose Monitoring Suppl [facility] VERIO) w/Device KIT [id number] Yes  Use to check blood sugar 2 times daily. (Dx: E11.9, Z79.1- type 2 DM with long term insulin use) Zola Button, Grayling Congress, DO Taking Active   cholecalciferol (VITAMIN D3) 25 MCG (1000 UNIT) tablet [id number] Yes Take 2,000 Units by mouth daily. [provider] Taking Active   cloNIDine (CATAPRES) 0.1 MG tablet [id number] Yes TAKE 1 TABLET(0.1 MG) BY MOUTH TWICE DAILY Seabron Spates R, DO Taking Active   cyanocobalamin (VITAMIN B12) 1000 MCG tablet [id number] No Take 1,000 mcg by mouth daily.  Patient not taking: Reported on 02/09/2023   [provider] Not Taking Active   cyclobenzaprine (FLEXERIL) 5 MG tablet [id number] Yes Take 1 tablet (5 mg total) by mouth 3 (three) times daily as needed for muscle spasms. Seabron Spates R, DO Taking Active   Dulaglutide (TRULICITY) 3 MG/0.5ML Namon Cirri [id number] Yes Inject 3 mg as directed once a week. Seabron Spates R, DO Taking Active   Insulin Lispro Prot & Lispro (HUMALOG MIX 75/25 KWIKPEN) (75-25) 100 UNIT/ML Stephanie Coup [id number] Yes Inject 25 Units into the skin 2 (two) times daily. Seabron Spates R, DO Taking Active   Insulin Pen Needle 32G X 4 MM MISC [id number] Yes Inject 1 Device into the skin in the morning and at bedtime. Use as instructed to inject insulin daily Shamleffer, Konrad Dolores, MD Taking Active   Lancets [facility] [place] PLUS [place]) MISC [id number] Yes Use as instructed to check blood sugar 2 times daily Shamleffer, Konrad Dolores, MD Taking Active   magnesium 30 MG tablet [id number] Yes Take 1 tablet (30 mg total) by mouth daily. Dione Booze, MD Taking Active   [facility] VERIO test strip [id number]  Use as instructed to check blood sugar 2 times daily (Dx: E11.9, Z79.1- type 2 DM with long term insulin use)  Patient not taking: Reported on 05/09/2023   Donato Schultz, DO  Active   pravastatin (PRAVACHOL) 40 MG tablet [id number] Yes Take 1 tablet (40 mg total) by mouth daily. Donato Schultz, DO Taking  Active   triamcinolone cream (KENALOG) 0.1 % [id number]  Apply 1 application topically 2 (two) times daily.  Patient not taking: Reported on 02/09/2023   Donato Schultz, DO  Active Other  triamterene-hydrochlorothiazide (MAXZIDE-25) 37.5-25 MG tablet [id number] Yes Take 1 tablet by mouth daily. Donato Schultz, DO Taking Active   Vitamin D, Ergocalciferol, (DRISDOL) 1.25 MG (50000 UNIT) CAPS capsule [id number] No Take 1 capsule (50,000 Units total) by mouth every 7 (seven) days.  Patient not taking: Reported on 02/09/2023   Donato Schultz, DO Not Taking Active             Patient Active Problem List   Diagnosis Date Noted   Unspecified adrenocortical insufficiency (HCC) 02/21/2022   Type 2 diabetes mellitus with diabetic  peripheral angiopathy without gangrene, without long-term current use of insulin (HCC) 08/20/2021   Hyperlipidemia 07/16/2020   Dizziness 04/15/2020   BMI 35.0-35.9,adult 04/15/2020   Weakness 04/15/2020   Abnormal cortisol level 04/14/2020   SIRS (systemic inflammatory response syndrome) (HCC) 04/06/2020   Pneumonia due to COVID-19 virus 05/14/2019   COVID-19 virus infection 05/13/2019   Renal insufficiency 03/26/2019   At risk for cardiovascular event 03/26/2019   Allergic reaction due to correct medicinal substance properly administered 08/23/2018   Hypertriglyceridemia 08/05/2018   Asthma, chronic, unspecified asthma severity, uncomplicated 08/05/2018   Chronic right shoulder pain 08/05/2018   Muscle spasm 08/05/2018   Insomnia 10/05/2017   Sepsis (HCC) 08/21/2017   Acute right hip pain 08/21/2017   Preventative health care 11/20/2016   Right ankle pain 05/09/2016   Palpitations 12/08/2014   DM (diabetes mellitus) type II uncontrolled, periph vascular disorder 08/03/2014   Leg wound, right 08/03/2014   Hyperglycemia 07/13/2014   Chest pain 07/12/2014   Diabetes mellitus (HCC) 07/12/2014   Acute kidney injury (HCC) 07/12/2014    Abdominal pain, right upper quadrant 05/28/2014   Tachycardia 05/03/2013   Abscess 11/13/2012   Breast pain, right 08/10/2012   Osteoarthritis of left shoulder 03/04/2011   BACK PAIN, LUMBAR, WITH RADICULOPATHY 09/17/2009   CALF PAIN, RIGHT 09/17/2009   RENAL CYST 08/28/2009   LOW BACK PAIN, ACUTE 11/13/2008   FIBROIDS, UTERUS 09/10/2007   UTI 09/03/2007   BACTERIAL VAGINITIS 09/03/2007   Flank pain 09/03/2007   Hypokalemia 09/03/2007   MOLE 06/21/2007   ALLERGIC RHINITIS 06/21/2007   HEMOCCULT POSITIVE STOOL 06/21/2007   SINUSITIS, ACUTE NOS 04/05/2007   REFLUX, ESOPHAGEAL 04/05/2007   CHEST PAIN 04/05/2007   Essential hypertension 03/19/2007   ALLERGIC RHINITIS, SEASONAL 03/09/2007   Asthma 03/09/2007     Medication Assistance:  None required.  Patient affirms current coverage meets needs.   Assessment / Plan:   Type 2 DM: A1c not quite at goal of < 7.0% Continue current therapy Trulicity 3mg  weekly and Humalog Mix 75/25 - 25 units twice a day.  Continue to check blood glucose up to twice a day.  Reviewed blood glucose goal. Reviewed home blood glucose readings and reviewed goals  Fasting blood glucose goal (before meals) = 80 to 130 Blood glucose goal after a meal = less than 180  Discussed annual eye exam. Per patient she has seen Dr Harriette Bouillon with [facility] this year. Called and requested fax of last office visit and diabetic eye exam.   Hypertension: Last office blood pressure was above goal but home blood pressure has been < 130/80 Continue amlodipine, clonidine and triamterene-hydrochlorothiazide.   Hyperlipidemia: Last LDL at goal of < 100.  Continue pravastatin 40mg  daily.   Made appointment for with PCP for June 06, 2023 (sees PCP every 6 months)    Low vitamin D and B12:  Recheck B12 and vitamin D at upcoming appt with PCP  Low serum Magnesium and potassium:  Continue magnesium 30mg  daily Recheck magnesium and potassium at next appt  with PCP  Medication management:  Reviewed med list and updated Reviewed refill history and discussed adherence Updated maintenance medication Rx to last until appt with PCP.   Meds ordered this encounter  Medications   amLODipine (NORVASC) 10 MG tablet    Sig: Take 1 tablet (10 mg total) by mouth daily.    Dispense:  30 tablet    Refill:  0   triamterene-hydrochlorothiazide (MAXZIDE-25) 37.5-25 MG tablet  Sig: Take 1 tablet by mouth daily.    Dispense:  30 tablet    Refill:  0   pravastatin (PRAVACHOL) 40 MG tablet    Sig: Take 1 tablet (40 mg total) by mouth daily.    Dispense:  30 tablet    Refill:  0    Follow Up:  Telephone follow up appointment with care management team member scheduled for:  1 to 2  months.    Henrene Pastor, PharmD Clinical Pharmacist [facility]  - [facility] [phone]

## 2023-06-01 ENCOUNTER — Other Ambulatory Visit: Payer: Self-pay

## 2023-06-01 ENCOUNTER — Emergency Department (HOSPITAL_COMMUNITY)
Admission: EM | Admit: 2023-06-01 | Discharge: 2023-06-02 | Discharge status: Against Medical Advice | Payer: Medicare Other | Attending: Emergency Medicine | Admitting: Emergency Medicine

## 2023-06-01 ENCOUNTER — Encounter (HOSPITAL_COMMUNITY): Payer: Self-pay

## 2023-06-01 DIAGNOSIS — Z5321 Procedure and treatment not carried out due to patient leaving prior to being seen by health care provider: Secondary | ICD-10-CM | POA: Diagnosis not present

## 2023-06-01 DIAGNOSIS — Z1152 Encounter for screening for COVID-19: Secondary | ICD-10-CM | POA: Diagnosis not present

## 2023-06-01 DIAGNOSIS — J020 Streptococcal pharyngitis: Secondary | ICD-10-CM | POA: Diagnosis not present

## 2023-06-01 DIAGNOSIS — R070 Pain in throat: Secondary | ICD-10-CM | POA: Diagnosis not present

## 2023-06-01 DIAGNOSIS — G4489 Other headache syndrome: Secondary | ICD-10-CM | POA: Diagnosis not present

## 2023-06-01 DIAGNOSIS — R519 Headache, unspecified: Secondary | ICD-10-CM | POA: Diagnosis present

## 2023-06-01 DIAGNOSIS — R509 Fever, unspecified: Secondary | ICD-10-CM | POA: Diagnosis not present

## 2023-06-01 DIAGNOSIS — Z743 Need for continuous supervision: Secondary | ICD-10-CM | POA: Diagnosis not present

## 2023-06-01 LAB — GROUP A STREP BY PCR: Group A Strep by PCR: DETECTED — AB

## 2023-06-01 LAB — RESP PANEL BY RT-PCR (RSV, FLU A&B, COVID)  RVPGX2
Influenza A by PCR: NEGATIVE
Influenza B by PCR: NEGATIVE
Resp Syncytial Virus by PCR: NEGATIVE
SARS Coronavirus 2 by RT PCR: NEGATIVE

## 2023-06-01 NOTE — ED Notes (Signed)
Pt walked out of ED and told security that she was done waiting.

## 2023-06-01 NOTE — ED Triage Notes (Signed)
PER EMS: pt reports body aches, headache, subjective fever, chills, and sore throat that started yesterday evening. Denies nausea/vomiting/diarrhea.  BP- 140/90, HR-110, 98% RA, CBG-200

## 2023-06-06 ENCOUNTER — Ambulatory Visit: Payer: Medicare Other | Admitting: Family Medicine

## 2023-06-08 ENCOUNTER — Telehealth: Payer: Self-pay

## 2023-06-08 NOTE — Telephone Encounter (Signed)
Transition Care Management Unsuccessful Follow-up Telephone Call  Date of discharge and from where:  [facility] 8/16  Attempts:  1st Attempt  Reason for unsuccessful TCM follow-up call:  No answer/busy   Lenard Forth [place]  [facility], [facility] Guide, Phone: [phone] Website: [email or web address]

## 2023-06-09 ENCOUNTER — Telehealth (INDEPENDENT_AMBULATORY_CARE_PROVIDER_SITE_OTHER): Payer: Medicare Other | Admitting: Family Medicine

## 2023-06-09 ENCOUNTER — Encounter: Payer: Self-pay | Admitting: Family Medicine

## 2023-06-09 ENCOUNTER — Telehealth: Payer: Self-pay

## 2023-06-09 DIAGNOSIS — J020 Streptococcal pharyngitis: Secondary | ICD-10-CM | POA: Insufficient documentation

## 2023-06-09 MED ORDER — AMOXICILLIN 875 MG PO TABS
875.0000 mg | ORAL_TABLET | Freq: Two times a day (BID) | ORAL | 0 refills | Status: AC
Start: 2023-06-09 — End: 2023-06-19

## 2023-06-09 NOTE — Telephone Encounter (Signed)
 Transition Care Management Unsuccessful Follow-up Telephone Call  Date of discharge and from where:  [facility]  Attempts:  2nd Attempt  Reason for unsuccessful TCM follow-up call:  No answer/busy   Lenard Forth [place]  [facility], [facility] Guide, Phone: [phone] Website: [email or web address]

## 2023-06-09 NOTE — Assessment & Plan Note (Signed)
Tested + in ER Pt left prior to being treated Amoxicillin bid x 10 days  Call or return to office as needed

## 2023-06-09 NOTE — Progress Notes (Signed)
MyChart Video Visit    Virtual Visit via Video Note   This patient is at least at moderate risk for complications without adequate follow up. This format is felt to be most appropriate for this patient at this time. Physical exam was limited by quality of the video and audio technology used for the visit. [name] was able to get the patient set up on a video visit.  Patient location: home Patient and provider in visit Provider location: Office  I discussed the limitations of evaluation and management by telemedicine and the availability of in person appointments. The patient expressed understanding and agreed to proceed.  Visit Date: 06/09/2023  Today's healthcare provider: Donato Schultz, DO     Subjective:    Patient ID: Heather Walker, female    DOB: 1961-10-04, 62 y.o.   MRN: [id number]  Chief Complaint  Patient presents with   Hypertension   Hyperlipidemia   Follow-up    HPI Patient is in today for sore throat. Discussed the use of AI scribe software for clinical note transcription with the patient, who gave verbal consent to proceed.  History of Present Illness   The patient, with a history of diabetes, presents with a sore throat and fever that started last Wednesday. She sought care at the emergency room last Thursday, where she underwent a COVID test and a throat test. However, she left the ER before seeing a doctor due to a long wait time. She reports that she has been feeling very sick, especially at night. She has been able to drink a lot but has only recently started eating a little due to difficulty swallowing. She has not been able to check her temperature due to a misplaced thermometer. Her blood sugar levels have been around 130-120, except for one instance when it was 200 after drinking [place] juice.       Past Medical History:  Diagnosis Date   Allergy    Anemia    Anxiety    Arthritis    Asthma    Diabetes mellitus without complication  (HCC)    Heart murmur    Hypertension     Past Surgical History:  Procedure Laterality Date   ABDOMINAL HYSTERECTOMY     APPENDECTOMY     BLADDER SUSPENSION     CESAREAN SECTION     3 previous   TEE WITHOUT CARDIOVERSION N/A 08/24/2017   Procedure: TRANSESOPHAGEAL ECHOCARDIOGRAM (TEE);  Surgeon: Laurey Morale, MD;  Location: [facility] ENDOSCOPY;  Service: Cardiovascular;  Laterality: N/A;   TUBAL LIGATION      Family History  Problem Relation Age of Onset   Hypertension Mother    Asthma Mother    Hypertension Sister    Asthma Sister    Diabetes Sister    Heart defect Sister    Asthma Sister    Cancer Sister    Kidney disease Sister    Aneurysm Sister    Asthma Sister    Colon cancer Brother 66   Hypertension Maternal Grandmother    Colon polyps Neg Hx    Esophageal cancer Neg Hx    Rectal cancer Neg Hx    Stomach cancer Neg Hx     Social History   Socioeconomic History   Marital status: Married    Spouse name: Not on file   Number of children: Not on file   Years of education: ged   Highest education level: Not on file  Occupational History   Occupation:  disabled  Tobacco Use   Smoking status: Never   Smokeless tobacco: Never  Vaping Use   Vaping status: Never Used  Substance and Sexual Activity   Alcohol use: No   Drug use: No   Sexual activity: Yes    Partners: Male    Birth control/protection: Surgical  Other Topics Concern   Not on file  Social History Narrative   Exercise - watching 2 toddlers    Social Determinants of Health   Financial Resource Strain: Medium Risk (04/18/2022)   Overall Financial Resource Strain (CARDIA)    Difficulty of Paying Living Expenses: Somewhat hard  Food Insecurity: No Food Insecurity (07/13/2021)   Hunger Vital Sign    Worried About Running Out of Food in the Last Year: Never true    Ran Out of Food in the Last Year: Never true  Transportation Needs: No Transportation Needs (07/13/2021)   PRAPARE - Therapist, art (Medical): No    Lack of Transportation (Non-Medical): No  Physical Activity: Insufficiently Active (11/04/2021)   Exercise Vital Sign    Days of Exercise per Week: 3 days    Minutes of Exercise per Session: 30 min  Stress: No Stress Concern Present (07/13/2021)   [employer] of Occupational Health - Occupational Stress Questionnaire    Feeling of Stress : Not at all  Social Connections: Moderately Integrated (07/13/2021)   Social Connection and Isolation Panel [NHANES]    Frequency of Communication with Friends and Family: More than three times a week    Frequency of Social Gatherings with Friends and Family: More than three times a week    Attends Religious Services: More than 4 times per year    Active Member of [employer] or Organizations: No    Attends Banker Meetings: Never    Marital Status: Married  Catering manager Violence: Not At Risk (07/13/2021)   Humiliation, Afraid, Rape, and Kick questionnaire    Fear of Current or Ex-Partner: No    Emotionally Abused: No    Physically Abused: No    Sexually Abused: No    Outpatient Medications Prior to Visit  Medication Sig Dispense Refill   albuterol (VENTOLIN HFA) 108 (90 Base) MCG/ACT inhaler Inhale 2 puffs into the lungs every 4 (four) hours as needed for wheezing or shortness of breath (or coughing). 1 each 0   amLODipine (NORVASC) 10 MG tablet Take 1 tablet (10 mg total) by mouth daily. 30 tablet 0   aspirin EC 81 MG tablet Take 81 mg by mouth daily. Swallow whole.     Blood Glucose Monitoring Suppl (ONETOUCH VERIO) w/Device KIT Use to check blood sugar 2 times daily. (Dx: E11.9, Z79.1- type 2 DM with long term insulin use) 1 kit 0   cholecalciferol (VITAMIN D3) 25 MCG (1000 UNIT) tablet Take 2,000 Units by mouth daily.     cloNIDine (CATAPRES) 0.1 MG tablet TAKE 1 TABLET(0.1 MG) BY MOUTH TWICE DAILY 180 tablet 0   cyanocobalamin (VITAMIN B12) 1000 MCG tablet Take 1,000 mcg by mouth daily.  (Patient not taking: Reported on 02/09/2023)     cyclobenzaprine (FLEXERIL) 5 MG tablet Take 1 tablet (5 mg total) by mouth 3 (three) times daily as needed for muscle spasms. 30 tablet 1   Dulaglutide (TRULICITY) 3 MG/0.5ML SOPN Inject 3 mg as directed once a week. 6 mL 3   Insulin Lispro Prot & Lispro (HUMALOG MIX 75/25 KWIKPEN) (75-25) 100 UNIT/ML Kwikpen Inject 25 Units into the skin  2 (two) times daily. 45 mL 0   Insulin Pen Needle 32G X 4 MM MISC Inject 1 Device into the skin in the morning and at bedtime. Use as instructed to inject insulin daily 200 each 3   Lancets (ONETOUCH DELICA PLUS LANCET33G) MISC Use as instructed to check blood sugar 2 times daily 100 each 6   magnesium 30 MG tablet Take 1 tablet (30 mg total) by mouth daily. 15 tablet 0   ONETOUCH VERIO test strip Use as instructed to check blood sugar 2 times daily (Dx: E11.9, Z79.1- type 2 DM with long term insulin use) (Patient not taking: Reported on 05/09/2023) 100 each 0   pravastatin (PRAVACHOL) 40 MG tablet Take 1 tablet (40 mg total) by mouth daily. 30 tablet 0   triamcinolone cream (KENALOG) 0.1 % Apply 1 application topically 2 (two) times daily. (Patient not taking: Reported on 02/09/2023) 30 g 0   triamterene-hydrochlorothiazide (MAXZIDE-25) 37.5-25 MG tablet Take 1 tablet by mouth daily. 30 tablet 0   Vitamin D, Ergocalciferol, (DRISDOL) 1.25 MG (50000 UNIT) CAPS capsule Take 1 capsule (50,000 Units total) by mouth every 7 (seven) days. (Patient not taking: Reported on 02/09/2023) 12 capsule 1   Facility-Administered Medications Prior to Visit  Medication Dose Route Frequency Provider Last Rate Last Admin   0.9 %  sodium chloride infusion  500 mL Intravenous Once Nandigam, Eleonore Chiquito, MD        Allergies  Allergen Reactions   Crestor [Rosuvastatin Calcium] Anaphylaxis   Metformin And Related Diarrhea    Nausea and diarrhea   Atorvastatin Other (See Comments)    Neck stiffness   Codeine Itching    Review of Systems   Constitutional:  Positive for chills, fever and malaise/fatigue.  HENT:  Positive for congestion and sore throat.   Respiratory:  Negative for cough.   Cardiovascular: Negative.        Objective:    Physical Exam Vitals and nursing note reviewed.  Constitutional:      Appearance: She is obese. She is ill-appearing.  Pulmonary:     Effort: Pulmonary effort is normal.  Neurological:     Mental Status: She is alert.  Psychiatric:        Mood and Affect: Mood normal.        Behavior: Behavior normal.        Thought Content: Thought content normal.        Judgment: Judgment normal.     There were no vitals taken for this visit. Wt Readings from Last 3 Encounters:  06/01/23 185 lb (83.9 kg)  05/09/23 185 lb (83.9 kg)  04/11/23 185 lb (83.9 kg)       Assessment & Plan:  Strep throat Assessment & Plan: Tested + in ER Pt left prior to being treated Amoxicillin bid x 10 days  Call or return to office as needed   Orders: -     Amoxicillin; Take 1 tablet (875 mg total) by mouth 2 (two) times daily for 10 days.  Dispense: 20 tablet; Refill: 0   Assessment and Plan    Strep Throat Positive strep test with ongoing sore throat and fever. Difficulty swallowing but able to maintain hydration and nutrition. -Prescribe Amoxicillin. -If difficulty swallowing worsens, patient to notify office.  Diabetes Blood sugars reported as 120-130, with one reading of 200 after juice intake. -Continue current management. -Avoid prescribing Prednisone due to potential for hyperglycemia.  General Health Maintenance -Encourage patient to seek medical attention if symptoms  worsen or do not improve with antibiotic therapy.        I discussed the assessment and treatment plan with the patient. The patient was provided an opportunity to ask questions and all were answered. The patient agreed with the plan and demonstrated an understanding of the instructions.   The patient was advised to  call back or seek an in-person evaluation if the symptoms worsen or if the condition fails to improve as anticipated.  Donato Schultz, DO [facility] at [facility] [phone] (phone) [phone] (fax)  [facility]

## 2023-06-28 ENCOUNTER — Ambulatory Visit: Payer: Medicare Other | Admitting: Pharmacist

## 2023-06-28 DIAGNOSIS — I10 Essential (primary) hypertension: Secondary | ICD-10-CM

## 2023-06-28 MED ORDER — PRAVASTATIN SODIUM 40 MG PO TABS: 40.0000 mg | ORAL_TABLET | Freq: Every day | ORAL | 0 refills | Status: DC

## 2023-06-28 MED ORDER — TRIAMTERENE-HCTZ 37.5-25 MG PO TABS
1.0000 | ORAL_TABLET | Freq: Every day | ORAL | 0 refills | Status: DC
Start: 2023-06-28 — End: 2024-05-27

## 2023-06-28 MED ORDER — CLONIDINE HCL 0.1 MG PO TABS
ORAL_TABLET | ORAL | 0 refills | Status: DC
Start: 2023-06-28 — End: 2024-05-27

## 2023-06-28 MED ORDER — AMLODIPINE BESYLATE 10 MG PO TABS
10.0000 mg | ORAL_TABLET | Freq: Every day | ORAL | 0 refills | Status: DC
Start: 2023-06-28 — End: 2024-05-27

## 2023-06-28 NOTE — Progress Notes (Signed)
Pharmacy Note  06/28/2023 Name: [name] MRN: [id number] DOB: [birth year]  Subjective: Heather Walker is a 62 y.o. year old female who is a primary care patient of Heather Walker, Heather Congress, DO. Clinical Pharmacist Practitioner referral was placed to assist with medication management and hypertension.    Engaged with patient by telephone for follow up visit today.  Type 2 DM:  Managed by Dr Lonzo Cloud Current therapy: Trulicity 3mg  weekly and Humalog Mix 75/25 - 25 units twice a day.  Denies hypoglycemia.  Reports she has been checking blood glucose daily. Ranges from 130 to 160 She has been approved for patient assistance program for Trulicity and Humalog 75/25 thru 09/2023  Hypertension:  Current medications for blood pressure: amlodipine, clonidine and triamterene-hydrochlorothiazide.  Patient is checking blood pressure at home 120 - 130's / 80's mostly. Other readings - 125/79, 106/83, 110/77, 130/82  Last office blood pressure at GI office was not at goal on 05/09/2023 and blood pressure in ED was slightly elevated at 128/91 but patient was not feeling well. See below Blood pressure today was 125/79 - check by patient at home and HR was 77   BP Readings from Last 3 Encounters:  06/01/23 (!) 128/91  05/09/23 (!) 148/88  11/15/22 130/80     Hyperlipidemia:  Taking pravastatin 40mg  daily.  Unable to take rosuvastatin - anaphylaxis or atorvastatin - caused neck stiffness.   Low vitamin D and B12: Last checked 05/2020 - B12 was low normal at 264. Took injections for awhile. She has started over-the-counter B12 daily but reports she has not taken recently because she ran out.  Last Vitamin D was 12. Prescribed weekly vitamin D in past but patient never started due to cost. She also has not started over-the-counter Vitamin D as recommended at last visit 07/11/2022  Low serum Magnesium :  Patient is taking magnesium supplement 30mg  daily.  .  Objective: Review of  patient status, including review of consultants reports, laboratory and other test data, was performed as part of comprehensive evaluation and provision of chronic care management services.   Lab Results  Component Value Date   CREATININE 0.76 11/15/2022   CREATININE 0.90 10/06/2022   CREATININE 0.95 02/15/2022    Lab Results  Component Value Date   HGBA1C 7.6 (H) 11/15/2022       Component Value Date/Time   CHOL 161 11/15/2022 1332   TRIG 113.0 11/15/2022 1332   HDL 68.50 11/15/2022 1332   CHOLHDL 2 11/15/2022 1332   VLDL 22.6 11/15/2022 1332   LDLCALC 70 11/15/2022 1332   LDLCALC 89 07/16/2020 1320   LDLDIRECT 115.0 08/02/2018 1525     Clinical ASCVD: Yes  The 10-year ASCVD risk score (Arnett DK, et al., 2019) is: 11.4%   Values used to calculate the score:     Age: 47 years     Sex: Female     Is Non-Hispanic African American: Yes     Diabetic: Yes     Tobacco smoker: No     Systolic Blood Pressure: 128 mmHg     Is BP treated: Yes     HDL Cholesterol: 68.5 mg/dL     Total Cholesterol: 161 mg/dL    BP Readings from Last 3 Encounters:  06/01/23 (!) 128/91  05/09/23 (!) 148/88  11/15/22 130/80     Allergies  Allergen Reactions   Crestor [Rosuvastatin Calcium] Anaphylaxis   Metformin And Related Diarrhea    Nausea and diarrhea   Atorvastatin  Other (See Comments)    Neck stiffness   Codeine Itching    Medications Reviewed Today     Reviewed by Henrene Pastor, RPH-CPP (Pharmacist) on 06/28/23 at 1505  Med List Status: <None>   Medication Order Taking? Sig Documenting Provider Last Dose Status Informant  0.9 %  sodium chloride infusion [id number]   Nandigam, Eleonore Chiquito, MD  Active   albuterol (VENTOLIN HFA) 108 (90 Base) MCG/ACT inhaler [id number] Yes Inhale 2 puffs into the lungs every 4 (four) hours as needed for wheezing or shortness of breath (or coughing). Dione Booze, MD Taking Active   amLODipine (NORVASC) 10 MG tablet [id number] Yes Take 1 tablet (10 mg  total) by mouth daily. Seabron Spates R, DO Taking Active   aspirin EC 81 MG tablet [id number] Yes Take 81 mg by mouth daily. Swallow whole. [provider] Taking Active   Blood Glucose Monitoring Suppl [facility] VERIO) w/Device KIT [id number]  Use to check blood sugar 2 times daily. (Dx: E11.9, Z79.1- type 2 DM with long term insulin use) Lowne Chase, Heather Congress, DO  Active   cholecalciferol (VITAMIN D3) 25 MCG (1000 UNIT) tablet [id number] No Take 2,000 Units by mouth daily.  Patient not taking: Reported on 06/28/2023   [provider] Not Taking Active   cloNIDine (CATAPRES) 0.1 MG tablet [id number] Yes TAKE 1 TABLET(0.1 MG) BY MOUTH TWICE DAILY Seabron Spates R, DO Taking Active   cyanocobalamin (VITAMIN B12) 1000 MCG tablet [id number] No Take 1,000 mcg by mouth daily.  Patient not taking: Reported on 06/28/2023   [provider] Not Taking Active   cyclobenzaprine (FLEXERIL) 5 MG tablet [id number]  Take 1 tablet (5 mg total) by mouth 3 (three) times daily as needed for muscle spasms. Heather Walker, Heather Congress, DO  Active   Dulaglutide (TRULICITY) 3 MG/0.5ML Namon Cirri [id number] Yes Inject 3 mg as directed once a week. Donato Schultz, DO Taking Active            Med Note Marius Ditch May 29, 2023  3:55 PM) Per patient - getting from patient assistance program 2024  Insulin Lispro Prot & Lispro (HUMALOG MIX 75/25 KWIKPEN) (75-25) 100 UNIT/ML Stephanie Coup [id number] Yes Inject 25 Units into the skin 2 (two) times daily. Seabron Spates R, DO Taking Active   Insulin Pen Needle 32G X 4 MM MISC [id number] Yes Inject 1 Device into the skin in the morning and at bedtime. Use as instructed to inject insulin daily Shamleffer, Konrad Dolores, MD Taking Active   Lancets [facility] [place] PLUS [place]) MISC [id number]  Use as instructed to check blood sugar 2 times daily Shamleffer, Konrad Dolores, MD  Active   magnesium 30 MG tablet [id number] Yes Take 1 tablet (30 mg  total) by mouth daily. Dione Booze, MD Taking Active   [facility] VERIO test strip [id number]  Use as instructed to check blood sugar 2 times daily (Dx: E11.9, Z79.1- type 2 DM with long term insulin use) Heather Walker, Heather Congress, DO  Active   pravastatin (PRAVACHOL) 40 MG tablet [id number] Yes Take 1 tablet (40 mg total) by mouth daily. Donato Schultz, DO Taking Active   triamcinolone cream (KENALOG) 0.1 % [id number]  Apply 1 application topically 2 (two) times daily. Heather Walker, Heather Congress, DO  Active Other  triamterene-hydrochlorothiazide (MAXZIDE-25) 37.5-25 MG tablet [id number] Yes Take 1 tablet by mouth daily. Donato Schultz, DO Taking Active   Vitamin D, Ergocalciferol, (DRISDOL) 1.25  MG (50000 UNIT) CAPS capsule [id number] Yes Take 1 capsule (50,000 Units total) by mouth every 7 (seven) days. Donato Schultz, DO Taking Active             Patient Active Problem List   Diagnosis Date Noted   Strep throat 06/09/2023   Unspecified adrenocortical insufficiency (HCC) 02/21/2022   Type 2 diabetes mellitus with diabetic peripheral angiopathy without gangrene, without long-term current use of insulin (HCC) 08/20/2021   Hyperlipidemia 07/16/2020   Dizziness 04/15/2020   BMI 35.0-35.9,adult 04/15/2020   Weakness 04/15/2020   Abnormal cortisol level 04/14/2020   SIRS (systemic inflammatory response syndrome) (HCC) 04/06/2020   Pneumonia due to COVID-19 virus 05/14/2019   COVID-19 virus infection 05/13/2019   Renal insufficiency 03/26/2019   At risk for cardiovascular event 03/26/2019   Allergic reaction due to correct medicinal substance properly administered 08/23/2018   Hypertriglyceridemia 08/05/2018   Asthma, chronic, unspecified asthma severity, uncomplicated 08/05/2018   Chronic right shoulder pain 08/05/2018   Muscle spasm 08/05/2018   Insomnia 10/05/2017   Sepsis (HCC) 08/21/2017   Acute right hip pain 08/21/2017   Preventative health care 11/20/2016   Right ankle  pain 05/09/2016   Palpitations 12/08/2014   DM (diabetes mellitus) type II uncontrolled, periph vascular disorder 08/03/2014   Leg wound, right 08/03/2014   Hyperglycemia 07/13/2014   Chest pain 07/12/2014   Diabetes mellitus (HCC) 07/12/2014   Acute kidney injury (HCC) 07/12/2014   Abdominal pain, right upper quadrant 05/28/2014   Tachycardia 05/03/2013   Abscess 11/13/2012   Breast pain, right 08/10/2012   Osteoarthritis of left shoulder 03/04/2011   BACK PAIN, LUMBAR, WITH RADICULOPATHY 09/17/2009   CALF PAIN, RIGHT 09/17/2009   RENAL CYST 08/28/2009   LOW BACK PAIN, ACUTE 11/13/2008   FIBROIDS, UTERUS 09/10/2007   UTI 09/03/2007   BACTERIAL VAGINITIS 09/03/2007   Flank pain 09/03/2007   Hypokalemia 09/03/2007   MOLE 06/21/2007   ALLERGIC RHINITIS 06/21/2007   HEMOCCULT POSITIVE STOOL 06/21/2007   SINUSITIS, ACUTE NOS 04/05/2007   REFLUX, ESOPHAGEAL 04/05/2007   CHEST PAIN 04/05/2007   Essential hypertension 03/19/2007   ALLERGIC RHINITIS, SEASONAL 03/09/2007   Asthma 03/09/2007     Medication Assistance:  None required.  Patient affirms current coverage meets needs.   Assessment / Plan:   Type 2 DM: A1c not quite at goal of < 7.0% Continue current therapy Trulicity 3mg  weekly and Humalog Mix 75/25 - 25 units twice a day.  Continue to check blood glucose up to twice a day.  Reviewed blood glucose goal. Reviewed home blood glucose readings and reviewed goals  Fasting blood glucose goal (before meals) = 80 to 130 Blood glucose goal after a meal = less than 180   Hypertension: Last office blood pressure was above goal but home blood pressure has been mostly < 130/80 Continue amlodipine, clonidine and triamterene-hydrochlorothiazide.  Continue to check blood pressure 2 to 3 times per week and record.   Hyperlipidemia: Last LDL at goal of < 100.  Continue pravastatin 40mg  daily.   Low vitamin D and B12:  Recheck B12 and vitamin D at upcoming appt with PCP.   Reminded patient she can use over-the-counter benefits with [facility] to purchase Vitamin D or B12.   Low serum Magnesium and potassium:  Continue magnesium 30mg  daily Recheck magnesium and potassium at next appt with PCP  Medication management:  Reviewed med list and updated Reviewed refill history and discussed adherence Updated maintenance medication Rx to last until  appt with PCP.   Meds ordered this encounter  Medications   amLODipine (NORVASC) 10 MG tablet    Sig: Take 1 tablet (10 mg total) by mouth daily.    Dispense:  60 tablet    Refill:  0   cloNIDine (CATAPRES) 0.1 MG tablet    Sig: TAKE 1 TABLET(0.1 MG) BY MOUTH TWICE DAILY    Dispense:  120 tablet    Refill:  0   triamterene-hydrochlorothiazide (MAXZIDE-25) 37.5-25 MG tablet    Sig: Take 1 tablet by mouth daily.    Dispense:  60 tablet    Refill:  0   pravastatin (PRAVACHOL) 40 MG tablet    Sig: Take 1 tablet (40 mg total) by mouth daily.    Dispense:  60 tablet    Refill:  0    Follow Up:  Telephone follow up appointment with care management team member scheduled for:  1 to 2  months.  Made appointment for with PCP for 08/17/2023 (patient missed appointment in August due to illness). Tried to get patient in before 07/17/2023 but PCP will be out of town and patient needs more advanced notice for appointment because she keeps her 2 grandchildren every day and her daughter would need to take the day off.   Henrene Pastor, PharmD Clinical Pharmacist [facility]  - [facility] [phone]

## 2023-08-17 ENCOUNTER — Ambulatory Visit: Payer: Medicare Other | Admitting: Family Medicine

## 2023-08-28 ENCOUNTER — Telehealth: Payer: Self-pay | Admitting: Pharmacist

## 2023-08-28 ENCOUNTER — Telehealth: Payer: Medicare Other

## 2023-08-28 NOTE — Telephone Encounter (Signed)
Attempt was made to contact patient by phone today for follow up by Clinical Pharmacist regarding medication adherence. Patient also is due to follow up with PCP.  Unable to reach patient. LM on VM with my contact number [phone] and PCP office number [phone]

## 2023-12-14 ENCOUNTER — Encounter (HOSPITAL_COMMUNITY): Payer: Self-pay

## 2023-12-14 ENCOUNTER — Ambulatory Visit (HOSPITAL_COMMUNITY)
Admission: RE | Admit: 2023-12-14 | Discharge: 2023-12-14 | Disposition: A | Discharge status: Home / Self Care | Payer: Medicare Other | Source: Ambulatory Visit | Attending: Physician Assistant | Admitting: Physician Assistant

## 2023-12-14 VITALS — BP 168/118 | HR 90 | Temp 98.3°F | Resp 16

## 2023-12-14 DIAGNOSIS — S83412A Sprain of medial collateral ligament of left knee, initial encounter: Secondary | ICD-10-CM | POA: Diagnosis not present

## 2023-12-14 DIAGNOSIS — G8929 Other chronic pain: Secondary | ICD-10-CM | POA: Diagnosis not present

## 2023-12-14 DIAGNOSIS — M25562 Pain in left knee: Secondary | ICD-10-CM

## 2023-12-14 DIAGNOSIS — M25462 Effusion, left knee: Secondary | ICD-10-CM | POA: Diagnosis not present

## 2023-12-14 MED ORDER — MELOXICAM 15 MG PO TABS: 15.0000 mg | ORAL_TABLET | Freq: Every day | ORAL | 0 refills | Status: DC

## 2023-12-14 NOTE — ED Triage Notes (Signed)
 Patient c/o left knee pain. Patient states she fell )ctober 2024 and has had left knee pain since, but worsening in the past few days.   Patient states she has been taking Tylenol and Aleve.

## 2023-12-14 NOTE — Discharge Instructions (Addendum)
 VISIT SUMMARY:  You visited [place] today due to worsening knee pain following a fall in October. The pain is particularly severe at night and affects your sleep. You have been using over-the-counter medications without relief.  YOUR PLAN:  -KNEE PAIN: Knee pain can result from various causes, including injuries and arthritis. Your pain seems to be related to a strain in the medial ligament of your knee, possibly with underlying arthritis. We recommend using warm compresses and a knee brace for stabilization. You have been prescribed meloxicam 15 mg to be taken once daily with food and water. You should alternate this with Tylenol and avoid other NSAIDs. We are referring you to an orthopedic specialist for further evaluation and potential physical therapy. Please contact [facility] for an appointment.  I have sent in a script for Meloxicam for you to take once per day. DO NOT use other NSAIDs while taking this medication (ibuprofen, motrin, aleve, advil, naproxen, etc)   [facility] [address], [address], [place], [place] [address] [phone]   INSTRUCTIONS:  Please follow up with the orthopedic specialist as soon as possible for further evaluation and potential physical therapy. Contact [facility] to schedule your appointment.

## 2023-12-14 NOTE — ED Provider Notes (Signed)
 [facility]    CSN: [id number] Arrival date & time: 12/14/23  1111      History   Chief Complaint Chief Complaint  Patient presents with   Knee Pain    Entered by patient    HPI Heather Walker is a 63 y.o. female.   HPI  Discussed the use of AI scribe software for clinical note transcription with the patient, who gave verbal consent to proceed.  The patient presents with worsening knee pain after a fall in October.  In October, she experienced a fall when she slipped on a puddle of water in her bathroom, resulting in her leg being stuck between the door. The affected knee took the brunt of the impact. Initially, there was no noticeable bruising or swelling, only pain.  She has been experiencing worsening knee pain localized to the inside of the knee, particularly severe at night, affecting her sleep. No noticeable swelling, bruising, or redness is present. She describes difficulty walking due to the pain and has not experienced any numbness or tingling in the lower leg or foot. She has not had any additional injuries or falls since the initial incident.  She has been using arthritis Aleve and arthritis Tylenol for pain management but stopped taking them the night before the visit as they were not effective. She has difficulty sleeping due to the pain and mentions that crossing her leg exacerbates the discomfort. She reports a fall in Oct 2024 and states since then she has had persistent left knee pain along the medial aspect of joint line     Past Medical History:  Diagnosis Date   Allergy    Anemia    Anxiety    Arthritis    Asthma    Diabetes mellitus without complication (HCC)    Heart murmur    Hypertension     Patient Active Problem List   Diagnosis Date Noted   Strep throat 06/09/2023   Unspecified adrenocortical insufficiency (HCC) 02/21/2022   Type 2 diabetes mellitus with diabetic peripheral angiopathy without gangrene, without long-term current  use of insulin (HCC) 08/20/2021   Hyperlipidemia 07/16/2020   Dizziness 04/15/2020   BMI 35.0-35.9,adult 04/15/2020   Weakness 04/15/2020   Abnormal cortisol level 04/14/2020   SIRS (systemic inflammatory response syndrome) (HCC) 04/06/2020   Pneumonia due to COVID-19 virus 05/14/2019   COVID-19 virus infection 05/13/2019   Renal insufficiency 03/26/2019   At risk for cardiovascular event 03/26/2019   Allergic reaction due to correct medicinal substance properly administered 08/23/2018   Hypertriglyceridemia 08/05/2018   Asthma, chronic, unspecified asthma severity, uncomplicated 08/05/2018   Chronic right shoulder pain 08/05/2018   Muscle spasm 08/05/2018   Insomnia 10/05/2017   Sepsis (HCC) 08/21/2017   Acute right hip pain 08/21/2017   Preventative health care 11/20/2016   Right ankle pain 05/09/2016   Palpitations 12/08/2014   DM (diabetes mellitus) type II uncontrolled, periph vascular disorder 08/03/2014   Leg wound, right 08/03/2014   Hyperglycemia 07/13/2014   Chest pain 07/12/2014   Diabetes mellitus (HCC) 07/12/2014   Acute kidney injury (HCC) 07/12/2014   Abdominal pain, right upper quadrant 05/28/2014   Tachycardia 05/03/2013   Abscess 11/13/2012   Breast pain, right 08/10/2012   Osteoarthritis of left shoulder 03/04/2011   BACK PAIN, LUMBAR, WITH RADICULOPATHY 09/17/2009   CALF PAIN, RIGHT 09/17/2009   RENAL CYST 08/28/2009   LOW BACK PAIN, ACUTE 11/13/2008   FIBROIDS, UTERUS 09/10/2007   UTI 09/03/2007   BACTERIAL VAGINITIS 09/03/2007  Flank pain 09/03/2007   Hypokalemia 09/03/2007   MOLE 06/21/2007   ALLERGIC RHINITIS 06/21/2007   HEMOCCULT POSITIVE STOOL 06/21/2007   SINUSITIS, ACUTE NOS 04/05/2007   REFLUX, ESOPHAGEAL 04/05/2007   CHEST PAIN 04/05/2007   Essential hypertension 03/19/2007   ALLERGIC RHINITIS, SEASONAL 03/09/2007   Asthma 03/09/2007    Past Surgical History:  Procedure Laterality Date   ABDOMINAL HYSTERECTOMY     APPENDECTOMY      BLADDER SUSPENSION     CESAREAN SECTION     3 previous   TEE WITHOUT CARDIOVERSION N/A 08/24/2017   Procedure: TRANSESOPHAGEAL ECHOCARDIOGRAM (TEE);  Surgeon: Laurey Morale, MD;  Location: [facility] ENDOSCOPY;  Service: Cardiovascular;  Laterality: N/A;   TUBAL LIGATION      OB History     Gravida  3   Para  3   Term  3   Preterm      AB      Living  3      SAB      IAB      Ectopic      Multiple      Live Births               Home Medications    Prior to Admission medications   Medication Sig Start Date End Date Taking? Authorizing Provider  meloxicam (MOBIC) 15 MG tablet Take 1 tablet (15 mg total) by mouth daily. 12/14/23  Yes Vinny Taranto E, PA-C  albuterol (VENTOLIN HFA) 108 (90 Base) MCG/ACT inhaler Inhale 2 puffs into the lungs every 4 (four) hours as needed for wheezing or shortness of breath (or coughing). 10/07/22   Dione Booze, MD  amLODipine (NORVASC) 10 MG tablet Take 1 tablet (10 mg total) by mouth daily. 06/28/23   Donato Schultz, DO  aspirin EC 81 MG tablet Take 81 mg by mouth daily. Swallow whole.    [provider]  Blood Glucose Monitoring Suppl (ONETOUCH VERIO) w/Device KIT Use to check blood sugar 2 times daily. (Dx: E11.9, Z79.1- type 2 DM with long term insulin use) 02/22/23   Zola Button, Grayling Congress, DO  cholecalciferol (VITAMIN D3) 25 MCG (1000 UNIT) tablet Take 2,000 Units by mouth daily. Patient not taking: Reported on 06/28/2023    [provider]  cloNIDine (CATAPRES) 0.1 MG tablet TAKE 1 TABLET(0.1 MG) BY MOUTH TWICE DAILY 06/28/23   Zola Button, Grayling Congress, DO  cyanocobalamin (VITAMIN B12) 1000 MCG tablet Take 1,000 mcg by mouth daily. Patient not taking: Reported on 06/28/2023    [provider]  cyclobenzaprine (FLEXERIL) 5 MG tablet Take 1 tablet (5 mg total) by mouth 3 (three) times daily as needed for muscle spasms. 05/06/22   Seabron Spates R, DO  Dulaglutide (TRULICITY) 3 MG/0.5ML SOPN Inject 3 mg as  directed once a week. 02/22/23   Donato Schultz, DO  Insulin Lispro Prot & Lispro (HUMALOG MIX 75/25 KWIKPEN) (75-25) 100 UNIT/ML Kwikpen Inject 25 Units into the skin 2 (two) times daily. 02/22/23   Donato Schultz, DO  Insulin Pen Needle 32G X 4 MM MISC Inject 1 Device into the skin in the morning and at bedtime. Use as instructed to inject insulin daily 04/04/22   Shamleffer, Konrad Dolores, MD  Lancets [facility] DELICA PLUS [place]) MISC Use as instructed to check blood sugar 2 times daily 02/23/21   Shamleffer, Konrad Dolores, MD  magnesium 30 MG tablet Take 1 tablet (30 mg total) by mouth daily. 10/07/22  Dione Booze, MD  [facility] VERIO test strip Use as instructed to check blood sugar 2 times daily (Dx: E11.9, Z79.1- type 2 DM with long term insulin use) 02/22/23   Zola Button, Grayling Congress, DO  pravastatin (PRAVACHOL) 40 MG tablet Take 1 tablet (40 mg total) by mouth daily. 06/28/23   Seabron Spates R, DO  triamcinolone cream (KENALOG) 0.1 % Apply 1 application topically 2 (two) times daily. 03/03/20   Donato Schultz, DO  triamterene-hydrochlorothiazide (MAXZIDE-25) 37.5-25 MG tablet Take 1 tablet by mouth daily. 06/28/23   Donato Schultz, DO  Vitamin D, Ergocalciferol, (DRISDOL) 1.25 MG (50000 UNIT) CAPS capsule Take 1 capsule (50,000 Units total) by mouth every 7 (seven) days. 11/16/22   Donato Schultz, DO    Family History Family History  Problem Relation Age of Onset   Hypertension Mother    Asthma Mother    Hypertension Sister    Asthma Sister    Diabetes Sister    Heart defect Sister    Asthma Sister    Cancer Sister    Kidney disease Sister    Aneurysm Sister    Asthma Sister    Colon cancer Brother 58   Hypertension Maternal Grandmother    Colon polyps Neg Hx    Esophageal cancer Neg Hx    Rectal cancer Neg Hx    Stomach cancer Neg Hx     Social History Social History   Tobacco Use   Smoking status: Never   Smokeless tobacco: Never   Vaping Use   Vaping status: Never Used  Substance Use Topics   Alcohol use: No   Drug use: No     Allergies   Crestor [rosuvastatin calcium], Metformin and related, Atorvastatin, and Codeine   Review of Systems Review of Systems  Musculoskeletal:  Positive for arthralgias and gait problem.  Neurological:  Negative for weakness and numbness.     Physical Exam Triage Vital Signs ED Triage Vitals [12/14/23 1204]  Encounter Vitals Group     BP (!) 168/118     Systolic BP Percentile      Diastolic BP Percentile      Pulse Rate 90     Resp 16     Temp 98.3 F (36.8 C)     Temp Source Oral     SpO2 96 %     Weight      Height      Head Circumference      Peak Flow      Pain Score 10     Pain Loc      Pain Education      Exclude from Growth Chart    No data found.  Updated Vital Signs BP (!) 168/118 (BP Location: Left Arm) Comment: Patient states she has not had her BP meds in 4 days.  Pulse 90   Temp 98.3 F (36.8 C) (Oral)   Resp 16   SpO2 96%   Visual Acuity Right Eye Distance:   Left Eye Distance:   Bilateral Distance:    Right Eye Near:   Left Eye Near:    Bilateral Near:     Physical Exam Vitals reviewed.  Constitutional:      General: She is awake.     Appearance: Normal appearance. She is well-developed and well-groomed.  HENT:     Head: Normocephalic and atraumatic.  Pulmonary:     Effort: Pulmonary effort is normal.  Musculoskeletal:  Left knee: Effusion and crepitus present. No swelling, deformity or erythema. Normal range of motion. Tenderness present over the medial joint line and MCL. No LCL laxity, MCL laxity, ACL laxity or PCL laxity.Normal alignment.     Instability Tests: Anterior drawer test negative. Posterior drawer test negative. Medial McMurray test negative and lateral McMurray test negative.     Left lower leg: Normal. No swelling. No edema.     Left ankle: Normal pulse.     Comments: Patient has notable pain with medial  McMurray testing but no notable laxity on exam  Apley grind testing is negative on the left side.  Anterior and posterior drawer testing is normal. She does have very mild effusion noted especially along the medial joint line.  Neurological:     Mental Status: She is alert.  Psychiatric:        Behavior: Behavior is cooperative.      UC Treatments / Results  Labs (all labs ordered are listed, but only abnormal results are displayed) Labs Reviewed - No data to display  EKG   Radiology No results found.  Procedures Procedures (including critical care time)  Medications Ordered in UC Medications - No data to display  Initial Impression / Assessment and Plan / UC Course  I have reviewed the triage vital signs and the nursing notes.  Pertinent labs & imaging results that were available during my care of the patient were reviewed by me and considered in my medical decision making (see chart for details).      Final Clinical Impressions(s) / UC Diagnoses   Final diagnoses:  Effusion of left knee  Chronic pain of left knee  Sprain of medial collateral ligament of left knee, initial encounter   Knee Pain Worsening medial knee pain post-fall in October. No visible swelling, bruising, or redness. Pain exacerbated by walking and disrupts sleep. Physical exam reveals medial joint  pain, suggesting possible medial ligament strain. No evidence of rupture or severe injury. Crepitus noted, indicating possible underlying arthritis. MRI may be required for further evaluation. Discussed meloxicam risks, including potential kidney strain with other NSAIDs.  Recommend follow-up with orthopedics for further evaluation and management.  She may need physical therapy for further recovery which can be provided with an orthopedics evaluation. - Recommend warm compresses - Provide knee brace for stabilization - Prescribe meloxicam 15 mg once daily with food and water - Advise alternating Tylenol  with meloxicam, avoiding other NSAIDs - Refer to orthopedic specialist for further evaluation and potential physical therapy - Provide contact information for [facility].   Discharge Instructions      VISIT SUMMARY:  You visited [place] today due to worsening knee pain following a fall in October. The pain is particularly severe at night and affects your sleep. You have been using over-the-counter medications without relief.  YOUR PLAN:  -KNEE PAIN: Knee pain can result from various causes, including injuries and arthritis. Your pain seems to be related to a strain in the medial ligament of your knee, possibly with underlying arthritis. We recommend using warm compresses and a knee brace for stabilization. You have been prescribed meloxicam 15 mg to be taken once daily with food and water. You should alternate this with Tylenol and avoid other NSAIDs. We are referring you to an orthopedic specialist for further evaluation and potential physical therapy. Please contact [facility] for an appointment.  I have sent in a script for Meloxicam for you to take once per day. DO NOT use  other NSAIDs while taking this medication (ibuprofen, motrin, aleve, advil, naproxen, etc)   [facility] [address], [address], [place], [place] [address] [phone]   INSTRUCTIONS:  Please follow up with the orthopedic specialist as soon as possible for further evaluation and potential physical therapy. Contact [facility] to schedule your appointment.     ED Prescriptions     Medication Sig Dispense Auth. Provider   meloxicam (MOBIC) 15 MG tablet Take 1 tablet (15 mg total) by mouth daily. 30 tablet Orren Pietsch E, PA-C      PDMP not reviewed this encounter.   Providence Crosby, PA-C 12/14/23 1343

## 2024-01-10 ENCOUNTER — Other Ambulatory Visit: Payer: Self-pay | Admitting: Family Medicine

## 2024-01-10 DIAGNOSIS — Z1231 Encounter for screening mammogram for malignant neoplasm of breast: Secondary | ICD-10-CM

## 2024-01-17 ENCOUNTER — Encounter (HOSPITAL_COMMUNITY): Payer: Self-pay

## 2024-01-17 ENCOUNTER — Other Ambulatory Visit: Payer: Self-pay

## 2024-01-17 ENCOUNTER — Ambulatory Visit (INDEPENDENT_AMBULATORY_CARE_PROVIDER_SITE_OTHER)

## 2024-01-17 ENCOUNTER — Ambulatory Visit (HOSPITAL_COMMUNITY)
Admission: RE | Admit: 2024-01-17 | Discharge: 2024-01-17 | Disposition: A | Discharge status: Home / Self Care | Source: Ambulatory Visit | Attending: Emergency Medicine | Admitting: Emergency Medicine

## 2024-01-17 VITALS — BP 159/99 | HR 72 | Temp 98.4°F | Resp 18

## 2024-01-17 DIAGNOSIS — S6992XA Unspecified injury of left wrist, hand and finger(s), initial encounter: Secondary | ICD-10-CM

## 2024-01-17 DIAGNOSIS — S62669A Nondisplaced fracture of distal phalanx of unspecified finger, initial encounter for closed fracture: Secondary | ICD-10-CM | POA: Diagnosis not present

## 2024-01-17 DIAGNOSIS — S62525A Nondisplaced fracture of distal phalanx of left thumb, initial encounter for closed fracture: Secondary | ICD-10-CM | POA: Diagnosis not present

## 2024-01-17 MED ORDER — HYDROCODONE-ACETAMINOPHEN 5-325 MG PO TABS: 1.0000 | ORAL_TABLET | Freq: Four times a day (QID) | ORAL | 0 refills | Status: AC | PRN

## 2024-01-17 NOTE — ED Triage Notes (Signed)
 PT reports she injured her Lt thumb yesterday when she fell out of a chair.

## 2024-01-17 NOTE — ED Provider Notes (Addendum)
 [facility]    CSN: [id number] Arrival date & time: 01/17/24  1215      History   Chief Complaint Chief Complaint  Patient presents with   Finger Injury    Entered by patient   thumb injury    HPI Heather Walker is a 63 y.o. female.   Patient presents to clinic over concerns of left thumb pain, bruising and swelling.  Fell onto her left side out of the chair yesterday and the wooden chair fell on top of her thumb.  She took an 800 mg ibuprofen last night down for the pain and swelling.  Having limited range of motion and is concerned that it may be broken.  Has not had any medication for this injury today.  She is not on any blood thinners.  The history is provided by the patient and medical records.    Past Medical History:  Diagnosis Date   Allergy    Anemia    Anxiety    Arthritis    Asthma    Diabetes mellitus without complication (HCC)    Heart murmur    Hypertension     Patient Active Problem List   Diagnosis Date Noted   Strep throat 06/09/2023   Unspecified adrenocortical insufficiency (HCC) 02/21/2022   Type 2 diabetes mellitus with diabetic peripheral angiopathy without gangrene, without long-term current use of insulin (HCC) 08/20/2021   Hyperlipidemia 07/16/2020   Dizziness 04/15/2020   BMI 35.0-35.9,adult 04/15/2020   Weakness 04/15/2020   Abnormal cortisol level 04/14/2020   SIRS (systemic inflammatory response syndrome) (HCC) 04/06/2020   Pneumonia due to COVID-19 virus 05/14/2019   COVID-19 virus infection 05/13/2019   Renal insufficiency 03/26/2019   At risk for cardiovascular event 03/26/2019   Allergic reaction due to correct medicinal substance properly administered 08/23/2018   Hypertriglyceridemia 08/05/2018   Asthma, chronic, unspecified asthma severity, uncomplicated 08/05/2018   Chronic right shoulder pain 08/05/2018   Muscle spasm 08/05/2018   Insomnia 10/05/2017   Sepsis (HCC) 08/21/2017   Acute right hip pain 08/21/2017    Preventative health care 11/20/2016   Right ankle pain 05/09/2016   Palpitations 12/08/2014   DM (diabetes mellitus) type II uncontrolled, periph vascular disorder 08/03/2014   Leg wound, right 08/03/2014   Hyperglycemia 07/13/2014   Chest pain 07/12/2014   Diabetes mellitus (HCC) 07/12/2014   Acute kidney injury (HCC) 07/12/2014   Abdominal pain, right upper quadrant 05/28/2014   Tachycardia 05/03/2013   Abscess 11/13/2012   Breast pain, right 08/10/2012   Osteoarthritis of left shoulder 03/04/2011   BACK PAIN, LUMBAR, WITH RADICULOPATHY 09/17/2009   CALF PAIN, RIGHT 09/17/2009   RENAL CYST 08/28/2009   LOW BACK PAIN, ACUTE 11/13/2008   FIBROIDS, UTERUS 09/10/2007   UTI 09/03/2007   BACTERIAL VAGINITIS 09/03/2007   Flank pain 09/03/2007   Hypokalemia 09/03/2007   MOLE 06/21/2007   ALLERGIC RHINITIS 06/21/2007   HEMOCCULT POSITIVE STOOL 06/21/2007   SINUSITIS, ACUTE NOS 04/05/2007   REFLUX, ESOPHAGEAL 04/05/2007   CHEST PAIN 04/05/2007   Essential hypertension 03/19/2007   ALLERGIC RHINITIS, SEASONAL 03/09/2007   Asthma 03/09/2007    Past Surgical History:  Procedure Laterality Date   ABDOMINAL HYSTERECTOMY     APPENDECTOMY     BLADDER SUSPENSION     CESAREAN SECTION     3 previous   TEE WITHOUT CARDIOVERSION N/A 08/24/2017   Procedure: TRANSESOPHAGEAL ECHOCARDIOGRAM (TEE);  Surgeon: Laurey Morale, MD;  Location[facility] ENDOSCOPY;  Service: Cardiovascular;  Laterality: N/A;  TUBAL LIGATION      OB History     Gravida  3   Para  3   Term  3   Preterm      AB      Living  3      SAB      IAB      Ectopic      Multiple      Live Births               Home Medications    Prior to Admission medications   Medication Sig Start Date End Date Taking? Authorizing Provider  albuterol (VENTOLIN HFA) 108 (90 Base) MCG/ACT inhaler Inhale 2 puffs into the lungs every 4 (four) hours as needed for wheezing or shortness of breath (or coughing).  10/07/22  Yes Dione Booze, MD  amLODipine (NORVASC) 10 MG tablet Take 1 tablet (10 mg total) by mouth daily. 06/28/23  Yes Donato Schultz, DO  aspirin EC 81 MG tablet Take 81 mg by mouth daily. Swallow whole.   Yes [provider]  cloNIDine (CATAPRES) 0.1 MG tablet TAKE 1 TABLET(0.1 MG) BY MOUTH TWICE DAILY 06/28/23  Yes Seabron Spates R, DO  cyclobenzaprine (FLEXERIL) 5 MG tablet Take 1 tablet (5 mg total) by mouth 3 (three) times daily as needed for muscle spasms. 05/06/22  Yes Donato Schultz, DO  HYDROcodone-acetaminophen (NORCO/VICODIN) 5-325 MG tablet Take 1-2 tablets by mouth every 6 (six) hours as needed for up to 5 days for moderate pain (pain score 4-6) or severe pain (pain score 7-10). 01/17/24 01/22/24 Yes Rinaldo Ratel, [place] N, FNP  Blood Glucose Monitoring Suppl (ONETOUCH VERIO) w/Device KIT Use to check blood sugar 2 times daily. (Dx: E11.9, Z79.1- type 2 DM with long term insulin use) 02/22/23   Zola Button, Grayling Congress, DO  cholecalciferol (VITAMIN D3) 25 MCG (1000 UNIT) tablet Take 2,000 Units by mouth daily. Patient not taking: Reported on 06/28/2023    [provider]  cyanocobalamin (VITAMIN B12) 1000 MCG tablet Take 1,000 mcg by mouth daily. Patient not taking: Reported on 06/28/2023    [provider]  Dulaglutide (TRULICITY) 3 MG/0.5ML SOPN Inject 3 mg as directed once a week. 02/22/23   Donato Schultz, DO  Insulin Lispro Prot & Lispro (HUMALOG MIX 75/25 KWIKPEN) (75-25) 100 UNIT/ML Kwikpen Inject 25 Units into the skin 2 (two) times daily. 02/22/23   Donato Schultz, DO  Insulin Pen Needle 32G X 4 MM MISC Inject 1 Device into the skin in the morning and at bedtime. Use as instructed to inject insulin daily 04/04/22   Shamleffer, Konrad Dolores, MD  Lancets [facility] DELICA PLUS [place]) MISC Use as instructed to check blood sugar 2 times daily 02/23/21   Shamleffer, Konrad Dolores, MD  magnesium 30 MG tablet Take 1 tablet (30 mg total)  by mouth daily. 10/07/22   Dione Booze, MD  meloxicam (MOBIC) 15 MG tablet Take 1 tablet (15 mg total) by mouth daily. 12/14/23   Mecum, Oswaldo Conroy, PA-C  ONETOUCH VERIO test strip Use as instructed to check blood sugar 2 times daily (Dx: E11.9, Z79.1- type 2 DM with long term insulin use) 02/22/23   Zola Button, Grayling Congress, DO  pravastatin (PRAVACHOL) 40 MG tablet Take 1 tablet (40 mg total) by mouth daily. 06/28/23   Seabron Spates R, DO  triamcinolone cream (KENALOG) 0.1 % Apply 1 application topically 2 (two) times daily. 03/03/20   Lowne  Florina Ou R, DO  triamterene-hydrochlorothiazide (MAXZIDE-25) 37.5-25 MG tablet Take 1 tablet by mouth daily. 06/28/23   Donato Schultz, DO  Vitamin D, Ergocalciferol, (DRISDOL) 1.25 MG (50000 UNIT) CAPS capsule Take 1 capsule (50,000 Units total) by mouth every 7 (seven) days. 11/16/22   Donato Schultz, DO    Family History Family History  Problem Relation Age of Onset   Hypertension Mother    Asthma Mother    Hypertension Sister    Asthma Sister    Diabetes Sister    Heart defect Sister    Asthma Sister    Cancer Sister    Kidney disease Sister    Aneurysm Sister    Asthma Sister    Colon cancer Brother 33   Hypertension Maternal Grandmother    Colon polyps Neg Hx    Esophageal cancer Neg Hx    Rectal cancer Neg Hx    Stomach cancer Neg Hx     Social History Social History   Tobacco Use   Smoking status: Never   Smokeless tobacco: Never  Vaping Use   Vaping status: Never Used  Substance Use Topics   Alcohol use: No   Drug use: No     Allergies   Crestor [rosuvastatin calcium], Metformin and related, Atorvastatin, and Codeine   Review of Systems Review of Systems  Per HPI  Physical Exam Triage Vital Signs ED Triage Vitals  Encounter Vitals Group     BP 01/17/24 1250 (!) 159/99     Systolic BP Percentile --      Diastolic BP Percentile --      Pulse Rate 01/17/24 1250 72     Resp 01/17/24 1250 18     Temp  01/17/24 1250 98.4 F (36.9 C)     Temp src --      SpO2 01/17/24 1250 96 %     Weight --      Height --      Head Circumference --      Peak Flow --      Pain Score 01/17/24 1248 9     Pain Loc --      Pain Education --      Exclude from Growth Chart --    No data found.  Updated Vital Signs BP (!) 159/99   Pulse 72   Temp 98.4 F (36.9 C)   Resp 18   SpO2 96%   Visual Acuity Right Eye Distance:   Left Eye Distance:   Bilateral Distance:    Right Eye Near:   Left Eye Near:    Bilateral Near:     Physical Exam Vitals and nursing note reviewed.  Constitutional:      Appearance: Normal appearance.  HENT:     Head: Normocephalic and atraumatic.     Right Ear: External ear normal.     Left Ear: External ear normal.     Nose: Nose normal.     Mouth/Throat:     Mouth: Mucous membranes are moist.  Eyes:     Conjunctiva/sclera: Conjunctivae normal.  Cardiovascular:     Rate and Rhythm: Normal rate.     Pulses: Normal pulses.  Pulmonary:     Effort: Pulmonary effort is normal. No respiratory distress.  Musculoskeletal:        General: Swelling, tenderness and signs of injury present.     Left hand: Swelling and tenderness present. Decreased range of motion. Normal capillary refill. Normal pulse.  Comments: Tenderness to palpation over proximal and distal phalanx, bruising and swelling noted.  Brisk capillary refill to distal thumb.  Range of motion limited.  Skin:    General: Skin is warm and dry.     Capillary Refill: Capillary refill takes less than 2 seconds.     Findings: Bruising present.     Comments: Bruising of her entirety of the left thumb.  Neurological:     General: No focal deficit present.     Mental Status: She is alert.  Psychiatric:        Mood and Affect: Mood normal.      UC Treatments / Results  Labs (all labs ordered are listed, but only abnormal results are displayed) Labs Reviewed - No data to display  EKG   Radiology No  results found.  Procedures Procedures (including critical care time)  Medications Ordered in UC Medications - No data to display  Initial Impression / Assessment and Plan / UC Course  I have reviewed the triage vital signs and the nursing notes.  Pertinent labs & imaging results that were available during my care of the patient were reviewed by me and considered in my medical decision making (see chart for details).  Vitals and triage reviewed, patient is hemodynamically stable. Left thumb with significant swelling and bruising, limitation to range of motion.  Pain centered around proximal and distal phalanx.  Imaging by my interpretation shows a tuft fracture that is nondisplaced at the base of the distal phalanx.  Provided with a protective finger splint.  Pain management, follow-up care return precautions given, no questions at this time.  Radiology over-read shows nondisplaced first distal phalangeal fracture. No changes in treatment plan.      Final Clinical Impressions(s) / UC Diagnoses   Final diagnoses:  Injury of left thumb, initial encounter  Closed nondisplaced fracture of distal phalanx of left thumb, initial encounter     Discharge Instructions      You had a fracture to the tip of your thumb and commonly occur with crush injuries. Most of these fractures heal in 3-4 weeks. If you are still having pain without improvement, follow-up with an orthopedic in the next 7-10 days to get repeat x-rays.  Wear the ankle splint to help prevent reinjury of the thumb and to allow for support.  You can take the Norco as needed for breakthrough or severe pain, alternating between Tylenol and ibuprofen in between as needed.  Keep the hand elevated for the rest of today.  Seek immediate care for any severe pain or any new concerning symptoms.      ED Prescriptions     Medication Sig Dispense Auth. Provider   HYDROcodone-acetaminophen (NORCO/VICODIN) 5-325 MG tablet Take 1-2  tablets by mouth every 6 (six) hours as needed for up to 5 days for moderate pain (pain score 4-6) or severe pain (pain score 7-10). 15 tablet Tyan Lasure, [place] N, [place]      I have reviewed the PDMP during this encounter.   Rea Kalama, [place] N, FNP 01/17/24 1337    Ozelle Brubacher, [place] N, [place] 01/17/24 [phone]

## 2024-01-17 NOTE — Discharge Instructions (Addendum)
 You had a fracture to the tip of your thumb and commonly occur with crush injuries. Most of these fractures heal in 3-4 weeks. If you are still having pain without improvement, follow-up with an orthopedic in the next 7-10 days to get repeat x-rays.  Wear the ankle splint to help prevent reinjury of the thumb and to allow for support.  You can take the Norco as needed for breakthrough or severe pain, alternating between Tylenol and ibuprofen in between as needed.  Keep the hand elevated for the rest of today.  Seek immediate care for any severe pain or any new concerning symptoms.

## 2024-01-23 ENCOUNTER — Ambulatory Visit
Admission: RE | Admit: 2024-01-23 | Discharge: 2024-01-23 | Disposition: A | Discharge status: Home / Self Care | Source: Ambulatory Visit | Attending: Family Medicine | Admitting: Family Medicine

## 2024-01-23 DIAGNOSIS — Z1231 Encounter for screening mammogram for malignant neoplasm of breast: Secondary | ICD-10-CM | POA: Diagnosis not present

## 2024-03-07 ENCOUNTER — Telehealth: Payer: Self-pay | Admitting: Family Medicine

## 2024-03-07 NOTE — Telephone Encounter (Signed)
 Copied from CRM [id number]. Topic: Medicare AWV >> Mar 07, 2024 10:23 AM Juliana Ocean wrote: Reason for CRM: LVM 03/07/2024 to schedule AWV. Please schedule Virtual or Telehealth visits ONLY  Rosalee Collins; Care Guide Ambulatory Clinical Support [place] [facility] Direct Dial: [phone]

## 2024-05-27 ENCOUNTER — Encounter: Payer: Self-pay | Admitting: Family Medicine

## 2024-05-27 ENCOUNTER — Other Ambulatory Visit: Payer: Self-pay | Admitting: Family Medicine

## 2024-05-27 ENCOUNTER — Ambulatory Visit (INDEPENDENT_AMBULATORY_CARE_PROVIDER_SITE_OTHER): Admitting: Family Medicine

## 2024-05-27 VITALS — BP 160/110 | HR 72 | Temp 98.9°F | Resp 16 | Ht 63.0 in | Wt 197.6 lb

## 2024-05-27 DIAGNOSIS — Z794 Long term (current) use of insulin: Secondary | ICD-10-CM | POA: Diagnosis not present

## 2024-05-27 DIAGNOSIS — Z0000 Encounter for general adult medical examination without abnormal findings: Secondary | ICD-10-CM | POA: Diagnosis not present

## 2024-05-27 DIAGNOSIS — E1169 Type 2 diabetes mellitus with other specified complication: Secondary | ICD-10-CM

## 2024-05-27 DIAGNOSIS — I10 Essential (primary) hypertension: Secondary | ICD-10-CM | POA: Diagnosis not present

## 2024-05-27 DIAGNOSIS — Z7984 Long term (current) use of oral hypoglycemic drugs: Secondary | ICD-10-CM | POA: Diagnosis not present

## 2024-05-27 DIAGNOSIS — E559 Vitamin D deficiency, unspecified: Secondary | ICD-10-CM | POA: Diagnosis not present

## 2024-05-27 DIAGNOSIS — E1165 Type 2 diabetes mellitus with hyperglycemia: Secondary | ICD-10-CM

## 2024-05-27 DIAGNOSIS — E785 Hyperlipidemia, unspecified: Secondary | ICD-10-CM | POA: Diagnosis not present

## 2024-05-27 MED ORDER — TRIAMTERENE-HCTZ 37.5-25 MG PO TABS: 1.0000 | ORAL_TABLET | Freq: Every day | ORAL | 0 refills | Status: AC

## 2024-05-27 MED ORDER — CLONIDINE HCL 0.1 MG PO TABS: ORAL_TABLET | ORAL | 0 refills | Status: AC

## 2024-05-27 MED ORDER — AMLODIPINE BESYLATE 10 MG PO TABS
10.0000 mg | ORAL_TABLET | Freq: Every day | ORAL | 0 refills | Status: AC
Start: 2024-05-27 — End: ?

## 2024-05-27 MED ORDER — VITAMIN D (ERGOCALCIFEROL) 1.25 MG (50000 UNIT) PO CAPS: 50000.0000 [IU] | ORAL_CAPSULE | ORAL | 1 refills | Status: DC

## 2024-05-27 MED ORDER — PRAVASTATIN SODIUM 40 MG PO TABS
40.0000 mg | ORAL_TABLET | Freq: Every day | ORAL | 0 refills | Status: DC
Start: 2024-05-27 — End: 2024-07-31

## 2024-05-27 NOTE — Assessment & Plan Note (Signed)
 Ghm utd Check labs  See AVS Health Maintenance  Topic Date Due   Diabetic kidney evaluation - Urine ACR  Never done   Medicare Annual Wellness (AWV)  07/13/2022   FOOT EXAM  04/05/2023   HEMOGLOBIN A1C  05/16/2023   Diabetic kidney evaluation - eGFR measurement  11/16/2023   OPHTHALMOLOGY EXAM  04/09/2024   COVID-19 Vaccine (3 - 2024-25 season) 06/12/2024 (Originally 06/18/2023)   Zoster Vaccines- Shingrix (1 of 2) 08/27/2024 (Originally 10/26/2012)   INFLUENZA VACCINE  01/14/2025 (Originally 05/17/2024)   DTaP/Tdap/Td (3 - Td or Tdap) 05/27/2025 (Originally 02/23/2021)   Pneumococcal Vaccine: 19-49 Years (2 of 2 - PCV) 05/27/2025 (Originally 07/16/2015)   Pneumococcal Vaccine: 50+ Years (2 of 2 - PCV) 05/27/2025 (Originally 07/16/2015)   MAMMOGRAM  01/22/2025   Colonoscopy  05/08/2026   Hepatitis C Screening  Completed   HIV Screening  Completed   Hepatitis B Vaccines  Aged Out   HPV VACCINES  Aged Out   Meningococcal B Vaccine  Aged Out

## 2024-05-27 NOTE — Progress Notes (Signed)
 Subjective:    Patient ID: Heather Walker, female    DOB: January 16, 1961, 63 y.o.   MRN: [id number]  Chief Complaint  Patient presents with   Annual Exam    Pt states fasting     HPI Patient is in today for CPE  Discussed the use of AI scribe software for clinical note transcription with the patient, who gave verbal consent to proceed.  History of Present Illness Heather Walker is a 63 year old female with hypertension and diabetes who presents for an annual physical exam.  Her blood pressure is currently elevated. She reports that she ran out of her medication. She did not anticipate running out before her appointment. Her current medications include amlodipine , clonidine , pravastatin , and Maxzide , which she obtains from [employer].  She has not taken her diabetes medication for over a year, noting that her blood sugar levels have been lower without it, staying in the low hundreds, compared to when she was taking the medication and her levels were between 190 and 230. She was previously under the care of Dr. Cinda for her diabetes but has not returned for follow-up.  She is partially responsible for caring for two toddlers, getting them in the morning and handing them over to her son by mid-morning. She regularly visits the eye doctor and has an upcoming dental appointment.  She has not received vaccines for shingles, tetanus, or pneumonia. She mentions having moles on her neck that sometimes get caught on clothing, but she has not used any treatment for them. No smoking. Alcohol consumption is stable. No symptoms of depression.   Past Medical History:  Diagnosis Date   Allergy    Anemia    Anxiety    Arthritis    Asthma    Diabetes mellitus without complication (HCC)    Heart murmur    Hypertension     Past Surgical History:  Procedure Laterality Date   ABDOMINAL HYSTERECTOMY     APPENDECTOMY     BLADDER SUSPENSION     CESAREAN SECTION     3 previous   TEE WITHOUT  CARDIOVERSION N/A 08/24/2017   Procedure: TRANSESOPHAGEAL ECHOCARDIOGRAM (TEE);  Surgeon: Rolan Ezra RAMAN, MD;  Location: [facility] ENDOSCOPY;  Service: Cardiovascular;  Laterality: N/A;   TUBAL LIGATION      Family History  Problem Relation Age of Onset   Hypertension Mother    Asthma Mother    Hypertension Sister    Asthma Sister    Diabetes Sister    Heart defect Sister    Asthma Sister    Cancer Sister    Kidney disease Sister    Aneurysm Sister    Asthma Sister    Colon cancer Brother 101   Hypertension Maternal Grandmother    Colon polyps Neg Hx    Esophageal cancer Neg Hx    Rectal cancer Neg Hx    Stomach cancer Neg Hx     Social History   Socioeconomic History   Marital status: Married    Spouse name: Not on file   Number of children: Not on file   Years of education: ged   Highest education level: Not on file  Occupational History   Occupation: disabled  Tobacco Use   Smoking status: Never   Smokeless tobacco: Never  Vaping Use   Vaping status: Never Used  Substance and Sexual Activity   Alcohol use: No   Drug use: No   Sexual activity: Yes    Partners: Male  Birth control/protection: Surgical  Other Topics Concern   Not on file  Social History Narrative   Exercise - watching 2 toddlers    Social Drivers of Health   Financial Resource Strain: Medium Risk (04/18/2022)   Overall Financial Resource Strain (CARDIA)    Difficulty of Paying Living Expenses: Somewhat hard  Food Insecurity: No Food Insecurity (07/13/2021)   Hunger Vital Sign    Worried About Running Out of Food in the Last Year: Never true    Ran Out of Food in the Last Year: Never true  Transportation Needs: No Transportation Needs (07/13/2021)   PRAPARE - Administrator, Civil Service (Medical): No    Lack of Transportation (Non-Medical): No  Physical Activity: Insufficiently Active (11/04/2021)   Exercise Vital Sign    Days of Exercise per Week: 3 days    Minutes of Exercise  per Session: 30 min  Stress: No Stress Concern Present (07/13/2021)   [employer] of Occupational Health - Occupational Stress Questionnaire    Feeling of Stress : Not at all  Social Connections: Moderately Integrated (07/13/2021)   Social Connection and Isolation Panel    Frequency of Communication with Friends and Family: More than three times a week    Frequency of Social Gatherings with Friends and Family: More than three times a week    Attends Religious Services: More than 4 times per year    Active Member of [employer] or Organizations: No    Attends Banker Meetings: Never    Marital Status: Married  Catering manager Violence: Not At Risk (07/13/2021)   Humiliation, Afraid, Rape, and Kick questionnaire    Fear of Current or Ex-Partner: No    Emotionally Abused: No    Physically Abused: No    Sexually Abused: No    Outpatient Medications Prior to Visit  Medication Sig Dispense Refill   albuterol  (VENTOLIN  HFA) 108 (90 Base) MCG/ACT inhaler Inhale 2 puffs into the lungs every 4 (four) hours as needed for wheezing or shortness of breath (or coughing). 1 each 0   aspirin  EC 81 MG tablet Take 81 mg by mouth daily. Swallow whole.     Blood Glucose Monitoring Suppl (ONETOUCH VERIO) w/Device KIT Use to check blood sugar 2 times daily. (Dx: E11.9, Z79.1- type 2 DM with long term insulin  use) 1 kit 0   cyclobenzaprine  (FLEXERIL ) 5 MG tablet Take 1 tablet (5 mg total) by mouth 3 (three) times daily as needed for muscle spasms. 30 tablet 1   Dulaglutide  (TRULICITY ) 3 MG/0.5ML SOPN Inject 3 mg as directed once a week. 6 mL 3   Insulin  Lispro Prot & Lispro (HUMALOG  MIX 75/25 KWIKPEN) (75-25) 100 UNIT/ML Kwikpen Inject 25 Units into the skin 2 (two) times daily. 45 mL 0   Insulin  Pen Needle 32G X 4 MM MISC Inject 1 Device into the skin in the morning and at bedtime. Use as instructed to inject insulin  daily 200 each 3   Lancets (ONETOUCH DELICA PLUS LANCET33G) MISC Use as instructed  to check blood sugar 2 times daily 100 each 6   magnesium  30 MG tablet Take 1 tablet (30 mg total) by mouth daily. 15 tablet 0   meloxicam  (MOBIC ) 15 MG tablet Take 1 tablet (15 mg total) by mouth daily. 30 tablet 0   ONETOUCH VERIO test strip Use as instructed to check blood sugar 2 times daily (Dx: E11.9, Z79.1- type 2 DM with long term insulin  use) 100 each 0  triamcinolone  cream (KENALOG ) 0.1 % Apply 1 application topically 2 (two) times daily. 30 g 0   amLODipine  (NORVASC ) 10 MG tablet Take 1 tablet (10 mg total) by mouth daily. 60 tablet 0   cloNIDine  (CATAPRES ) 0.1 MG tablet TAKE 1 TABLET(0.1 MG) BY MOUTH TWICE DAILY 120 tablet 0   pravastatin  (PRAVACHOL ) 40 MG tablet Take 1 tablet (40 mg total) by mouth daily. 60 tablet 0   triamterene -hydrochlorothiazide  (MAXZIDE -25) 37.5-25 MG tablet Take 1 tablet by mouth daily. 60 tablet 0   Vitamin D , Ergocalciferol , (DRISDOL ) 1.25 MG (50000 UNIT) CAPS capsule Take 1 capsule (50,000 Units total) by mouth every 7 (seven) days. 12 capsule 1   cholecalciferol (VITAMIN D3) 25 MCG (1000 UNIT) tablet Take 2,000 Units by mouth daily. (Patient not taking: Reported on 05/27/2024)     cyanocobalamin  (VITAMIN B12) 1000 MCG tablet Take 1,000 mcg by mouth daily. (Patient not taking: Reported on 05/27/2024)     Facility-Administered Medications Prior to Visit  Medication Dose Route Frequency Provider Last Rate Last Admin   0.9 %  sodium chloride  infusion  500 mL Intravenous Once Nandigam, Kavitha V, MD        Allergies  Allergen Reactions   Crestor  [Rosuvastatin  Calcium ] Anaphylaxis   Metformin  And Related Diarrhea    Nausea and diarrhea   Atorvastatin  Other (See Comments)    Neck stiffness   Codeine Itching    Review of Systems  Constitutional:  Negative for chills, fever and malaise/fatigue.  HENT:  Negative for congestion and hearing loss.   Eyes:  Negative for blurred vision and discharge.  Respiratory:  Negative for cough, sputum production and  shortness of breath.   Cardiovascular:  Negative for chest pain, palpitations and leg swelling.  Gastrointestinal:  Negative for abdominal pain, blood in stool, constipation, diarrhea, heartburn, nausea and vomiting.  Genitourinary:  Negative for dysuria, frequency, hematuria and urgency.  Musculoskeletal:  Negative for back pain, falls and myalgias.  Skin:  Negative for rash.  Neurological:  Negative for dizziness, sensory change, loss of consciousness, weakness and headaches.  Endo/Heme/Allergies:  Negative for environmental allergies. Does not bruise/bleed easily.  Psychiatric/Behavioral:  Negative for depression and suicidal ideas. The patient is not nervous/anxious and does not have insomnia.        Objective:    Physical Exam Vitals and nursing note reviewed.  Constitutional:      General: She is not in acute distress.    Appearance: Normal appearance. She is well-developed.  HENT:     Head: Normocephalic and atraumatic.     Right Ear: Tympanic membrane, ear canal and external ear normal. There is no impacted cerumen.     Left Ear: Tympanic membrane, ear canal and external ear normal. There is no impacted cerumen.     Nose: Nose normal.     Mouth/Throat:     Mouth: Mucous membranes are moist.     Pharynx: Oropharynx is clear. No oropharyngeal exudate or posterior oropharyngeal erythema.  Eyes:     General: No scleral icterus.       Right eye: No discharge.        Left eye: No discharge.     Conjunctiva/sclera: Conjunctivae normal.     Pupils: Pupils are equal, round, and reactive to light.  Neck:     Thyroid : No thyromegaly or thyroid  tenderness.     Vascular: No JVD.  Cardiovascular:     Rate and Rhythm: Normal rate and regular rhythm.     Heart sounds: Normal  heart sounds. No murmur heard. Pulmonary:     Effort: Pulmonary effort is normal. No respiratory distress.     Breath sounds: Normal breath sounds.  Abdominal:     General: Bowel sounds are normal. There is no  distension.     Palpations: Abdomen is soft. There is no mass.     Tenderness: There is no abdominal tenderness. There is no guarding or rebound.  Musculoskeletal:        General: Normal range of motion.     Cervical back: Normal range of motion and neck supple.     Right lower leg: No edema.     Left lower leg: No edema.  Lymphadenopathy:     Cervical: No cervical adenopathy.  Skin:    General: Skin is warm and dry.     Findings: No erythema or rash.  Neurological:     General: No focal deficit present.     Mental Status: She is alert and oriented to person, place, and time.     Cranial Nerves: No cranial nerve deficit.     Deep Tendon Reflexes: Reflexes are normal and symmetric.  Psychiatric:        Mood and Affect: Mood normal.        Behavior: Behavior normal.        Thought Content: Thought content normal.        Judgment: Judgment normal.     BP (!) 160/110 (BP Location: Left Arm, Patient Position: Sitting, Cuff Size: Large)   Pulse 72   Temp 98.9 F (37.2 C) (Oral)   Resp 16   Ht 5' 3 (1.6 m)   Wt 197 lb 9.6 oz (89.6 kg)   SpO2 98%   BMI 35.00 kg/m  Wt Readings from Last 3 Encounters:  05/27/24 197 lb 9.6 oz (89.6 kg)  06/01/23 185 lb (83.9 kg)  05/09/23 185 lb (83.9 kg)    Diabetic Foot Exam - Simple   Simple Foot Form Diabetic Foot exam was performed with the following findings: Yes 05/27/2024  3:29 PM  Visual Inspection No deformities, no ulcerations, no other skin breakdown bilaterally: Yes Sensation Testing Intact to touch and monofilament testing bilaterally: Yes Pulse Check Posterior Tibialis and Dorsalis pulse intact bilaterally: Yes Comments    Lab Results  Component Value Date   WBC 4.0 11/15/2022   HGB 12.9 11/15/2022   HCT 37.4 11/15/2022   PLT 239.0 11/15/2022   GLUCOSE 131 (H) 11/15/2022   CHOL 161 11/15/2022   TRIG 113.0 11/15/2022   HDL 68.50 11/15/2022   LDLDIRECT 115.0 08/02/2018   LDLCALC 70 11/15/2022   ALT 10 11/15/2022    AST 13 11/15/2022   NA 140 11/15/2022   K 3.7 11/15/2022   CL 102 11/15/2022   CREATININE 0.76 11/15/2022   BUN 9 11/15/2022   CO2 30 11/15/2022   TSH 2.11 11/15/2022   INR 1.0 04/15/2020   HGBA1C 7.6 (H) 11/15/2022    Lab Results  Component Value Date   TSH 2.11 11/15/2022   Lab Results  Component Value Date   WBC 4.0 11/15/2022   HGB 12.9 11/15/2022   HCT 37.4 11/15/2022   MCV 82.7 11/15/2022   PLT 239.0 11/15/2022   Lab Results  Component Value Date   NA 140 11/15/2022   K 3.7 11/15/2022   CO2 30 11/15/2022   GLUCOSE 131 (H) 11/15/2022   BUN 9 11/15/2022   CREATININE 0.76 11/15/2022   BILITOT 0.4 11/15/2022   ALKPHOS 119 (H)  11/15/2022   AST 13 11/15/2022   ALT 10 11/15/2022   PROT 7.0 11/15/2022   ALBUMIN 4.0 11/15/2022   CALCIUM  8.9 11/15/2022   ANIONGAP 9 10/06/2022   GFR 85.39 11/15/2022   Lab Results  Component Value Date   CHOL 161 11/15/2022   Lab Results  Component Value Date   HDL 68.50 11/15/2022   Lab Results  Component Value Date   LDLCALC 70 11/15/2022   Lab Results  Component Value Date   TRIG 113.0 11/15/2022   Lab Results  Component Value Date   CHOLHDL 2 11/15/2022   Lab Results  Component Value Date   HGBA1C 7.6 (H) 11/15/2022       Assessment & Plan:  Preventative health care Assessment & Plan: Ghm utd Check labs  See AVS Health Maintenance  Topic Date Due   Diabetic kidney evaluation - Urine ACR  Never done   Medicare Annual Wellness (AWV)  07/13/2022   FOOT EXAM  04/05/2023   HEMOGLOBIN A1C  05/16/2023   Diabetic kidney evaluation - eGFR measurement  11/16/2023   OPHTHALMOLOGY EXAM  04/09/2024   COVID-19 Vaccine (3 - 2024-25 season) 06/12/2024 (Originally 06/18/2023)   Zoster Vaccines- Shingrix (1 of 2) 08/27/2024 (Originally 10/26/2012)   INFLUENZA VACCINE  01/14/2025 (Originally 05/17/2024)   DTaP/Tdap/Td (3 - Td or Tdap) 05/27/2025 (Originally 02/23/2021)   Pneumococcal Vaccine: 19-49 Years (2 of 2 - PCV)  05/27/2025 (Originally 07/16/2015)   Pneumococcal Vaccine: 50+ Years (2 of 2 - PCV) 05/27/2025 (Originally 07/16/2015)   MAMMOGRAM  01/22/2025   Colonoscopy  05/08/2026   Hepatitis C Screening  Completed   HIV Screening  Completed   Hepatitis B Vaccines  Aged Out   HPV VACCINES  Aged Out   Meningococcal B Vaccine  Aged Out      Essential hypertension Assessment & Plan: Poorly controlled will alter medications, encouraged DASH diet, minimize caffeine and obtain adequate sleep. Report concerning symptoms and follow up as directed and as needed -----pt has been out of her meds for a week   Orders: -     amLODIPine  Besylate; Take 1 tablet (10 mg total) by mouth daily.  Dispense: 60 tablet; Refill: 0 -     cloNIDine  HCl; TAKE 1 TABLET(0.1 MG) BY MOUTH TWICE DAILY  Dispense: 120 tablet; Refill: 0 -     Lipid panel  Primary hypertension -     Triamterene -HCTZ; Take 1 tablet by mouth daily.  Dispense: 60 tablet; Refill: 0 -     Lipid panel -     CBC with Differential/Platelet -     Comprehensive metabolic panel with GFR -     Microalbumin / creatinine urine ratio  Type 2 diabetes mellitus with hyperglycemia, without long-term current use of insulin  (HCC) Assessment & Plan: Check labs   Orders: -     CBC with Differential/Platelet -     Comprehensive metabolic panel with GFR -     Hemoglobin A1c -     Microalbumin / creatinine urine ratio  Vitamin D  deficiency -     Vitamin D  (Ergocalciferol ); Take 1 capsule (50,000 Units total) by mouth every 7 (seven) days.  Dispense: 12 capsule; Refill: 1 -     VITAMIN D  25 Hydroxy (Vit-D Deficiency, Fractures)  Hyperlipidemia associated with type 2 diabetes mellitus (HCC) -     Pravastatin  Sodium; Take 1 tablet (40 mg total) by mouth daily.  Dispense: 60 tablet; Refill: 0 -     Lipid panel -  Comprehensive metabolic panel with GFR  Hyperlipidemia, unspecified hyperlipidemia type Assessment & Plan: Encourage heart healthy diet such as  MIND or DASH diet, increase exercise, avoid trans fats, simple carbohydrates and processed foods, consider a krill or fish or flaxseed oil cap daily.     Assessment and Plan Assessment & Plan Adult Wellness Visit   During the routine adult wellness visit, there were no changes in family history or new surgeries. She maintains regular ophthalmology visits and has an upcoming dental appointment. No depression was reported. Moles on her neck occasionally get caught in clothing. A urinalysis will be performed to check for proteinuria, and over-the-counter treatments for the neck moles will be discussed.  Essential hypertension   Her hypertension is currently uncontrolled due to medication non-adherence, increasing the risk of stroke or myocardial infarction. Emphasized the importance of medication adherence to prevent severe complications. Ensure all antihypertensive medications are available at the pharmacy. She should resume antihypertensive medications immediately. A nurse visit is scheduled in 2-3 weeks to recheck blood pressure.  Type 2 diabetes mellitus without complications   She has not been taking diabetes medication for over a year but reports improved glycemic control without it, with blood glucose levels in the low hundreds compared to higher levels when on medication. Discuss diabetes management and medication adherence.    Tametra Ahart R Lowne Chase, DO

## 2024-05-27 NOTE — Assessment & Plan Note (Signed)
 Check labs

## 2024-05-27 NOTE — Assessment & Plan Note (Signed)
 Poorly controlled will alter medications, encouraged DASH diet, minimize caffeine and obtain adequate sleep. Report concerning symptoms and follow up as directed and as needed -----pt has been out of her meds for a week

## 2024-05-27 NOTE — Assessment & Plan Note (Signed)
 Encourage heart healthy diet such as MIND or DASH diet, increase exercise, avoid trans fats, simple carbohydrates and processed foods, consider a krill or fish or flaxseed oil cap daily.

## 2024-05-28 ENCOUNTER — Ambulatory Visit: Payer: Self-pay | Admitting: Family Medicine

## 2024-05-28 LAB — LIPID PANEL
Cholesterol: 208 mg/dL — ABNORMAL HIGH (ref 0–200)
HDL: 70.1 mg/dL (ref >39.00)
LDL Cholesterol: 117 mg/dL — ABNORMAL HIGH (ref 0–99)
NonHDL: 137.55
Total CHOL/HDL Ratio: 3
Triglycerides: 102 mg/dL (ref 0.0–149.0)
VLDL: 20.4 mg/dL (ref 0.0–40.0)

## 2024-05-28 LAB — CBC WITH DIFFERENTIAL/PLATELET
Basophils Absolute: 0 K/uL (ref 0.0–0.1)
Basophils Relative: 0.8 % (ref 0.0–3.0)
Eosinophils Absolute: 0.1 K/uL (ref 0.0–0.7)
Eosinophils Relative: 2.2 % (ref 0.0–5.0)
HCT: 39.7 % (ref 36.0–46.0)
Hemoglobin: 12.9 g/dL (ref 12.0–15.0)
Lymphocytes Relative: 47.9 % — ABNORMAL HIGH (ref 12.0–46.0)
Lymphs Abs: 1.8 K/uL (ref 0.7–4.0)
MCHC: 32.4 g/dL (ref 30.0–36.0)
MCV: 83.1 fl (ref 78.0–100.0)
Monocytes Absolute: 0.4 K/uL (ref 0.1–1.0)
Monocytes Relative: 10.9 % (ref 3.0–12.0)
Neutro Abs: 1.5 K/uL (ref 1.4–7.7)
Neutrophils Relative %: 38.2 % — ABNORMAL LOW (ref 43.0–77.0)
Platelets: 236 K/uL (ref 150.0–400.0)
RBC: 4.78 Mil/uL (ref 3.87–5.11)
RDW: 15.6 % — ABNORMAL HIGH (ref 11.5–15.5)
WBC: 3.8 K/uL — ABNORMAL LOW (ref 4.0–10.5)

## 2024-05-28 LAB — COMPREHENSIVE METABOLIC PANEL WITH GFR
ALT: 10 U/L (ref 0–35)
AST: 15 U/L (ref 0–37)
Albumin: 4 g/dL (ref 3.5–5.2)
Alkaline Phosphatase: 102 U/L (ref 39–117)
BUN: 8 mg/dL (ref 6–23)
CO2: 33 meq/L — ABNORMAL HIGH (ref 19–32)
Calcium: 8.6 mg/dL (ref 8.4–10.5)
Chloride: 101 meq/L (ref 96–112)
Creatinine, Ser: 0.76 mg/dL (ref 0.40–1.20)
GFR: 84.47 mL/min (ref >60.00)
Glucose, Bld: 83 mg/dL (ref 70–99)
Potassium: 2.9 meq/L — ABNORMAL LOW (ref 3.5–5.1)
Sodium: 143 meq/L (ref 135–145)
Total Bilirubin: 0.5 mg/dL (ref 0.2–1.2)
Total Protein: 7.4 g/dL (ref 6.0–8.3)

## 2024-05-28 LAB — MICROALBUMIN / CREATININE URINE RATIO
Creatinine,U: 101 mg/dL
Microalb Creat Ratio: 305.5 mg/g — ABNORMAL HIGH (ref 0.0–30.0)
Microalb, Ur: 30.8 mg/dL — ABNORMAL HIGH (ref 0.0–1.9)

## 2024-05-28 LAB — VITAMIN D 25 HYDROXY (VIT D DEFICIENCY, FRACTURES): VITD: 15.6 ng/mL — ABNORMAL LOW (ref 30.00–100.00)

## 2024-05-28 LAB — HEMOGLOBIN A1C: Hgb A1c MFr Bld: 7.4 % — ABNORMAL HIGH (ref 4.6–6.5)

## 2024-05-31 ENCOUNTER — Other Ambulatory Visit: Payer: Self-pay

## 2024-05-31 ENCOUNTER — Other Ambulatory Visit: Payer: Self-pay | Admitting: Family Medicine

## 2024-05-31 DIAGNOSIS — E876 Hypokalemia: Secondary | ICD-10-CM

## 2024-05-31 DIAGNOSIS — E559 Vitamin D deficiency, unspecified: Secondary | ICD-10-CM

## 2024-05-31 MED ORDER — POTASSIUM CHLORIDE CRYS ER 20 MEQ PO TBCR: 20.0000 meq | EXTENDED_RELEASE_TABLET | Freq: Every day | ORAL | 2 refills | Status: DC

## 2024-06-18 ENCOUNTER — Ambulatory Visit: Admitting: Family Medicine

## 2024-06-27 ENCOUNTER — Ambulatory Visit: Admitting: Family Medicine

## 2024-07-02 ENCOUNTER — Ambulatory Visit (INDEPENDENT_AMBULATORY_CARE_PROVIDER_SITE_OTHER): Admitting: Family Medicine

## 2024-07-02 ENCOUNTER — Encounter: Payer: Self-pay | Admitting: Family Medicine

## 2024-07-02 VITALS — BP 160/100 | HR 84 | Temp 98.3°F | Resp 18 | Ht 63.0 in | Wt 193.8 lb

## 2024-07-02 DIAGNOSIS — E559 Vitamin D deficiency, unspecified: Secondary | ICD-10-CM

## 2024-07-02 DIAGNOSIS — I10 Essential (primary) hypertension: Secondary | ICD-10-CM | POA: Diagnosis not present

## 2024-07-02 DIAGNOSIS — E876 Hypokalemia: Secondary | ICD-10-CM | POA: Diagnosis not present

## 2024-07-02 DIAGNOSIS — E785 Hyperlipidemia, unspecified: Secondary | ICD-10-CM | POA: Diagnosis not present

## 2024-07-02 MED ORDER — METOPROLOL SUCCINATE ER 50 MG PO TB24
50.0000 mg | ORAL_TABLET | Freq: Every day | ORAL | 3 refills | Status: AC
Start: 2024-07-02 — End: ?

## 2024-07-02 NOTE — Progress Notes (Signed)
 Subjective:    Patient ID: Heather Walker, female    DOB: 01/29/1961, 63 y.o.   MRN: [id number]  Chief Complaint  Patient presents with   Hypertension   Follow-up    HPI Patient is in today for f/u htn.  Discussed the use of AI scribe software for clinical note transcription with the patient, who gave verbal consent to proceed.  History of Present Illness Heather Walker is a 63 year old female with hypertension who presents with elevated blood pressure.  She has been experiencing elevated blood pressure, which she attributes to increased stress from family circumstances. Her mother resides with her, and her older grandchildren, aged 75 to 72, frequently visit, causing disturbances and stress. She has not been monitoring her blood pressure at home recently due to the ongoing stress.  Her hypertension is managed with clonidine , lycopene, and Maxzide . She has previously tried losartan , which caused headaches, and has not been on metoprolol  or carvedilol .  She denies any swelling in the ankles and reports feeling generally okay. She maintains a routine of walking every day. Recent labs indicated low potassium levels, for which she is taking supplements, and she is also taking vitamin D . No swelling in the ankles.    Past Medical History:  Diagnosis Date   Allergy    Anemia    Anxiety    Arthritis    Asthma    Diabetes mellitus without complication (HCC)    Heart murmur    Hypertension     Past Surgical History:  Procedure Laterality Date   ABDOMINAL HYSTERECTOMY     APPENDECTOMY     BLADDER SUSPENSION     CESAREAN SECTION     3 previous   TEE WITHOUT CARDIOVERSION N/A 08/24/2017   Procedure: TRANSESOPHAGEAL ECHOCARDIOGRAM (TEE);  Surgeon: Rolan Ezra RAMAN, MD;  Location: [facility] ENDOSCOPY;  Service: Cardiovascular;  Laterality: N/A;   TUBAL LIGATION      Family History  Problem Relation Age of Onset   Hypertension Mother    Asthma Mother    Hypertension Sister    Asthma  Sister    Diabetes Sister    Heart defect Sister    Asthma Sister    Cancer Sister    Kidney disease Sister    Aneurysm Sister    Asthma Sister    Colon cancer Brother 74   Hypertension Maternal Grandmother    Colon polyps Neg Hx    Esophageal cancer Neg Hx    Rectal cancer Neg Hx    Stomach cancer Neg Hx     Social History   Socioeconomic History   Marital status: Married    Spouse name: Not on file   Number of children: Not on file   Years of education: ged   Highest education level: Not on file  Occupational History   Occupation: disabled  Tobacco Use   Smoking status: Never   Smokeless tobacco: Never  Vaping Use   Vaping status: Never Used  Substance and Sexual Activity   Alcohol use: No   Drug use: No   Sexual activity: Yes    Partners: Male    Birth control/protection: Surgical  Other Topics Concern   Not on file  Social History Narrative   Exercise - watching 2 toddlers    Social Drivers of Health   Financial Resource Strain: Medium Risk (04/18/2022)   Overall Financial Resource Strain (CARDIA)    Difficulty of Paying Living Expenses: Somewhat hard  Food Insecurity: No Food  Insecurity (07/13/2021)   Hunger Vital Sign    Worried About Running Out of Food in the Last Year: Never true    Ran Out of Food in the Last Year: Never true  Transportation Needs: No Transportation Needs (07/13/2021)   PRAPARE - Administrator, Civil Service (Medical): No    Lack of Transportation (Non-Medical): No  Physical Activity: Insufficiently Active (11/04/2021)   Exercise Vital Sign    Days of Exercise per Week: 3 days    Minutes of Exercise per Session: 30 min  Stress: No Stress Concern Present (07/13/2021)   [employer] of Occupational Health - Occupational Stress Questionnaire    Feeling of Stress : Not at all  Social Connections: Moderately Integrated (07/13/2021)   Social Connection and Isolation Panel    Frequency of Communication with Friends and  Family: More than three times a week    Frequency of Social Gatherings with Friends and Family: More than three times a week    Attends Religious Services: More than 4 times per year    Active Member of [employer] or Organizations: No    Attends Banker Meetings: Never    Marital Status: Married  Catering manager Violence: Not At Risk (07/13/2021)   Humiliation, Afraid, Rape, and Kick questionnaire    Fear of Current or Ex-Partner: No    Emotionally Abused: No    Physically Abused: No    Sexually Abused: No    Outpatient Medications Prior to Visit  Medication Sig Dispense Refill   albuterol  (VENTOLIN  HFA) 108 (90 Base) MCG/ACT inhaler Inhale 2 puffs into the lungs every 4 (four) hours as needed for wheezing or shortness of breath (or coughing). 1 each 0   amLODipine  (NORVASC ) 10 MG tablet Take 1 tablet (10 mg total) by mouth daily. 60 tablet 0   aspirin  EC 81 MG tablet Take 81 mg by mouth daily. Swallow whole.     Blood Glucose Monitoring Suppl (ONETOUCH VERIO) w/Device KIT Use to check blood sugar 2 times daily. (Dx: E11.9, Z79.1- type 2 DM with long term insulin  use) 1 kit 0   cloNIDine  (CATAPRES ) 0.1 MG tablet TAKE 1 TABLET(0.1 MG) BY MOUTH TWICE DAILY 120 tablet 0   cyclobenzaprine  (FLEXERIL ) 5 MG tablet Take 1 tablet (5 mg total) by mouth 3 (three) times daily as needed for muscle spasms. 30 tablet 1   Dulaglutide  (TRULICITY ) 3 MG/0.5ML SOPN Inject 3 mg as directed once a week. 6 mL 3   Insulin  Lispro Prot & Lispro (HUMALOG  MIX 75/25 KWIKPEN) (75-25) 100 UNIT/ML Kwikpen Inject 25 Units into the skin 2 (two) times daily. 45 mL 0   Insulin  Pen Needle 32G X 4 MM MISC Inject 1 Device into the skin in the morning and at bedtime. Use as instructed to inject insulin  daily 200 each 3   Lancets (ONETOUCH DELICA PLUS LANCET33G) MISC Use as instructed to check blood sugar 2 times daily 100 each 6   magnesium  30 MG tablet Take 1 tablet (30 mg total) by mouth daily. 15 tablet 0   meloxicam   (MOBIC ) 15 MG tablet Take 1 tablet (15 mg total) by mouth daily. 30 tablet 0   ONETOUCH VERIO test strip Use as instructed to check blood sugar 2 times daily (Dx: E11.9, Z79.1- type 2 DM with long term insulin  use) 100 each 0   potassium chloride  SA (KLOR-CON  M) 20 MEQ tablet Take 1 tablet (20 mEq total) by mouth daily. 30 tablet 2   pravastatin  (  PRAVACHOL ) 40 MG tablet Take 1 tablet (40 mg total) by mouth daily. 60 tablet 0   triamcinolone  cream (KENALOG ) 0.1 % Apply 1 application topically 2 (two) times daily. 30 g 0   triamterene -hydrochlorothiazide  (MAXZIDE -25) 37.5-25 MG tablet Take 1 tablet by mouth daily. 60 tablet 0   Vitamin D , Ergocalciferol , (DRISDOL ) 1.25 MG (50000 UNIT) CAPS capsule Take 1 capsule (50,000 Units total) by mouth every 7 (seven) days. 12 capsule 1   cholecalciferol (VITAMIN D3) 25 MCG (1000 UNIT) tablet Take 2,000 Units by mouth daily. (Patient not taking: Reported on 05/27/2024)     cyanocobalamin  (VITAMIN B12) 1000 MCG tablet Take 1,000 mcg by mouth daily. (Patient not taking: Reported on 05/27/2024)     Facility-Administered Medications Prior to Visit  Medication Dose Route Frequency Provider Last Rate Last Admin   0.9 %  sodium chloride  infusion  500 mL Intravenous Once Nandigam, Kavitha V, MD        Allergies  Allergen Reactions   Crestor  [Rosuvastatin  Calcium ] Anaphylaxis   Metformin  And Related Diarrhea    Nausea and diarrhea   Atorvastatin  Other (See Comments)    Neck stiffness   Codeine Itching    Review of Systems  Constitutional:  Negative for chills, fever and malaise/fatigue.  HENT:  Negative for congestion and hearing loss.   Eyes:  Negative for blurred vision and discharge.  Respiratory:  Negative for cough, sputum production and shortness of breath.   Cardiovascular:  Negative for chest pain, palpitations and leg swelling.  Gastrointestinal:  Negative for abdominal pain, blood in stool, constipation, diarrhea, heartburn, nausea and vomiting.   Genitourinary:  Negative for dysuria, frequency, hematuria and urgency.  Musculoskeletal:  Negative for back pain, falls and myalgias.  Skin:  Negative for rash.  Neurological:  Negative for dizziness, sensory change, loss of consciousness, weakness and headaches.  Endo/Heme/Allergies:  Negative for environmental allergies. Does not bruise/bleed easily.  Psychiatric/Behavioral:  Negative for depression and suicidal ideas. The patient is not nervous/anxious and does not have insomnia.        Objective:    Physical Exam Vitals and nursing note reviewed.  Constitutional:      General: She is not in acute distress.    Appearance: Normal appearance. She is well-developed.  HENT:     Head: Normocephalic and atraumatic.  Eyes:     General: No scleral icterus.       Right eye: No discharge.        Left eye: No discharge.  Cardiovascular:     Rate and Rhythm: Normal rate and regular rhythm.     Heart sounds: No murmur heard. Pulmonary:     Effort: Pulmonary effort is normal. No respiratory distress.     Breath sounds: Normal breath sounds.  Musculoskeletal:        General: Normal range of motion.     Cervical back: Normal range of motion and neck supple.     Right lower leg: No edema.     Left lower leg: No edema.  Skin:    General: Skin is warm and dry.  Neurological:     Mental Status: She is alert and oriented to person, place, and time.  Psychiatric:        Mood and Affect: Mood normal.        Behavior: Behavior normal.        Thought Content: Thought content normal.        Judgment: Judgment normal.     BP (!) 160/100 (  BP Location: Right Arm, Patient Position: Sitting, Cuff Size: Large)   Pulse 84   Temp 98.3 F (36.8 C) (Oral)   Resp 18   Ht 5' 3 (1.6 m)   Wt 193 lb 12.8 oz (87.9 kg)   SpO2 98%   BMI 34.33 kg/m  Wt Readings from Last 3 Encounters:  07/02/24 193 lb 12.8 oz (87.9 kg)  05/27/24 197 lb 9.6 oz (89.6 kg)  06/01/23 185 lb (83.9 kg)    Diabetic  Foot Exam - Simple   No data filed    Lab Results  Component Value Date   WBC 3.8 (L) 05/27/2024   HGB 12.9 05/27/2024   HCT 39.7 05/27/2024   PLT 236.0 05/27/2024   GLUCOSE 125 (H) 07/02/2024   CHOL 208 (H) 05/27/2024   TRIG 102.0 05/27/2024   HDL 70.10 05/27/2024   LDLDIRECT 115.0 08/02/2018   LDLCALC 117 (H) 05/27/2024   ALT 11 07/02/2024   AST 15 07/02/2024   NA 142 07/02/2024   K 3.0 (L) 07/02/2024   CL 101 07/02/2024   CREATININE 0.86 07/02/2024   BUN 9 07/02/2024   CO2 31 07/02/2024   TSH 2.11 11/15/2022   INR 1.0 04/15/2020   HGBA1C 7.4 (H) 05/27/2024   MICROALBUR 30.8 (H) 05/27/2024    Lab Results  Component Value Date   TSH 2.11 11/15/2022   Lab Results  Component Value Date   WBC 3.8 (L) 05/27/2024   HGB 12.9 05/27/2024   HCT 39.7 05/27/2024   MCV 83.1 05/27/2024   PLT 236.0 05/27/2024   Lab Results  Component Value Date   NA 142 07/02/2024   K 3.0 (L) 07/02/2024   CO2 31 07/02/2024   GLUCOSE 125 (H) 07/02/2024   BUN 9 07/02/2024   CREATININE 0.86 07/02/2024   BILITOT 0.5 07/02/2024   ALKPHOS 110 07/02/2024   AST 15 07/02/2024   ALT 11 07/02/2024   PROT 7.2 07/02/2024   ALBUMIN 4.2 07/02/2024   CALCIUM  9.3 07/02/2024   ANIONGAP 9 10/06/2022   GFR 72.78 07/02/2024   Lab Results  Component Value Date   CHOL 208 (H) 05/27/2024   Lab Results  Component Value Date   HDL 70.10 05/27/2024   Lab Results  Component Value Date   LDLCALC 117 (H) 05/27/2024   Lab Results  Component Value Date   TRIG 102.0 05/27/2024   Lab Results  Component Value Date   CHOLHDL 3 05/27/2024   Lab Results  Component Value Date   HGBA1C 7.4 (H) 05/27/2024       Assessment & Plan:  Hyperlipidemia, unspecified hyperlipidemia type Assessment & Plan: Encourage heart healthy diet such as MIND or DASH diet, increase exercise, avoid trans fats, simple carbohydrates and processed foods, consider a krill or fish or flaxseed oil cap daily.     Essential  hypertension -     Metoprolol  Succinate ER; Take 1 tablet (50 mg total) by mouth daily. Take with or immediately following a meal.  Dispense: 90 tablet; Refill: 3 -     Comprehensive metabolic panel with GFR  Vitamin D  deficiency -     VITAMIN D  25 Hydroxy (Vit-D Deficiency, Fractures)  Hypokalemia -     Comprehensive metabolic panel with GFR  Assessment and Plan Assessment & Plan Hypertension   Hypertension remains poorly controlled, likely due to stress from family dynamics. Blood pressure has been elevated since August. Current medications include clonidine , lycopene, and Maxzide , with amlodipine  at maximum dose. Losartan  previously caused headaches.  Prescribe metoprolol , one tablet daily. Schedule a nurse visit in 2-3 weeks to recheck blood pressure and a follow-up appointment in 3 months.  Hypokalemia   Previous labs indicated hypokalemia. She is taking potassium supplements. Recheck potassium levels with today's labs.  Vitamin D  deficiency   Previous labs indicated vitamin D  deficiency. She is taking vitamin D  supplements. Recheck vitamin D  levels with today's labs.    Keajah Killough R Lowne Chase, DO

## 2024-07-03 LAB — COMPREHENSIVE METABOLIC PANEL WITH GFR
ALT: 11 U/L (ref 0–35)
AST: 15 U/L (ref 0–37)
Albumin: 4.2 g/dL (ref 3.5–5.2)
Alkaline Phosphatase: 110 U/L (ref 39–117)
BUN: 9 mg/dL (ref 6–23)
CO2: 31 meq/L (ref 19–32)
Calcium: 9.3 mg/dL (ref 8.4–10.5)
Chloride: 101 meq/L (ref 96–112)
Creatinine, Ser: 0.86 mg/dL (ref 0.40–1.20)
GFR: 72.78 mL/min (ref >60.00)
Glucose, Bld: 125 mg/dL — ABNORMAL HIGH (ref 70–99)
Potassium: 3 meq/L — ABNORMAL LOW (ref 3.5–5.1)
Sodium: 142 meq/L (ref 135–145)
Total Bilirubin: 0.5 mg/dL (ref 0.2–1.2)
Total Protein: 7.2 g/dL (ref 6.0–8.3)

## 2024-07-03 LAB — VITAMIN D 25 HYDROXY (VIT D DEFICIENCY, FRACTURES): VITD: 33.78 ng/mL (ref 30.00–100.00)

## 2024-07-05 ENCOUNTER — Ambulatory Visit: Payer: Self-pay | Admitting: Family Medicine

## 2024-07-06 NOTE — Assessment & Plan Note (Signed)
 Encourage heart healthy diet such as MIND or DASH diet, increase exercise, avoid trans fats, simple carbohydrates and processed foods, consider a krill or fish or flaxseed oil cap daily.

## 2024-07-09 ENCOUNTER — Telehealth: Payer: Self-pay | Admitting: Family Medicine

## 2024-07-09 NOTE — Telephone Encounter (Signed)
 Copied from CRM [id number]. Topic: Clinical - Lab/Test Results >> Jul 09, 2024 10:56 AM Tiffini S wrote: Reason for CRM: Patient is asking for the labs to be sent to address by mail- she wasn't able to access her MyCHART/ patient had a missed call for results- was provided the results

## 2024-07-10 ENCOUNTER — Encounter: Payer: Self-pay | Admitting: Family Medicine

## 2024-07-10 MED ORDER — POTASSIUM CHLORIDE CRYS ER 20 MEQ PO TBCR: 40.0000 meq | EXTENDED_RELEASE_TABLET | Freq: Every day | ORAL | 2 refills | Status: AC

## 2024-07-10 NOTE — Telephone Encounter (Signed)
 Results mailed

## 2024-07-10 NOTE — Addendum Note (Signed)
 Addended by: Valerie Fredin D on: 07/10/2024 08:35 AM   Modules accepted: Orders

## 2024-07-12 ENCOUNTER — Other Ambulatory Visit

## 2024-07-15 ENCOUNTER — Telehealth: Payer: Self-pay

## 2024-07-15 NOTE — Telephone Encounter (Signed)
 Copied from CRM [id number]. Topic: Clinical - Medical Advice >> Jul 15, 2024 12:24 PM Rea BROCKS wrote: Reason for CRM: Patient's insurance Medicare wants to verify if she is still considered a diabetic patient?   [phone] (M)

## 2024-07-17 ENCOUNTER — Ambulatory Visit

## 2024-07-17 NOTE — Telephone Encounter (Signed)
 Pt made aware that she is still a diabetic

## 2024-07-19 ENCOUNTER — Other Ambulatory Visit

## 2024-07-31 ENCOUNTER — Other Ambulatory Visit: Payer: Self-pay | Admitting: Family Medicine

## 2024-07-31 DIAGNOSIS — E1169 Type 2 diabetes mellitus with other specified complication: Secondary | ICD-10-CM

## 2024-09-24 ENCOUNTER — Telehealth: Payer: Self-pay | Admitting: *Deleted

## 2024-09-24 ENCOUNTER — Ambulatory Visit

## 2024-09-24 NOTE — Telephone Encounter (Signed)
 Pt scheduled for AWV today at 11.  No answer x 2 attempts.  Left message to call and r/s.

## 2024-11-08 ENCOUNTER — Ambulatory Visit: Admitting: *Deleted

## 2024-11-08 ENCOUNTER — Telehealth: Payer: Self-pay | Admitting: *Deleted

## 2024-11-08 VITALS — Ht 63.0 in | Wt 193.0 lb

## 2024-11-08 DIAGNOSIS — Z0000 Encounter for general adult medical examination without abnormal findings: Secondary | ICD-10-CM | POA: Diagnosis not present

## 2024-11-08 DIAGNOSIS — Z1231 Encounter for screening mammogram for malignant neoplasm of breast: Secondary | ICD-10-CM

## 2024-11-08 MED ORDER — ONETOUCH DELICA PLUS LANCET33G MISC: 5 refills | Status: AC

## 2024-11-08 NOTE — Patient Instructions (Addendum)
 [name],  Thank you for taking the time for your Medicare Wellness Visit. I appreciate your continued commitment to your health goals. Please review the care plan we discussed, and feel free to reach out if I can assist you further.  Please note that Annual Wellness Visits do not include a physical exam. Some assessments may be limited, especially if the visit was conducted virtually. If needed, we may recommend an in-person follow-up with your provider.  Ongoing Car: Continue walking & drinking plenty of water & eat healthier  Seeing your primary care provider every 3 to 6 months helps us  monitor your health and provide consistent, personalized care.  Dr Antonio Meth: 2/19/126  11:20am  Medicare AWV: 11/11/25 1:40pm, telephone  Referrals If a referral was made during today's visit and you haven't received any updates within two weeks, please contact the referred provider directly to check on the status. Mammogram ([facility]) due 01/24/24:  [phone]  Recommended Screenings: You will need to get the following vaccines at your local pharmacy:  Tetanus  Health Maintenance  Topic Date Due   Medicare Annual Wellness Visit  07/13/2022   Eye exam for diabetics  04/09/2024   Flu Shot  01/14/2025*   DTaP/Tdap/Td vaccine (3 - Td or Tdap) 05/27/2025*   Pneumococcal Vaccine for age over 69 (2 of 2 - PCV) 05/27/2025*   COVID-19 Vaccine (3 - 2025-26 season) 11/08/2025*   Zoster (Shingles) Vaccine (1 of 2) 11/08/2025*   Kidney health urinalysis for diabetes  11/27/2024   Hemoglobin A1C  11/27/2024   Breast Cancer Screening  01/22/2025   Complete foot exam   05/27/2025   Yearly kidney function blood test for diabetes  07/02/2025   Colon Cancer Screening  05/08/2026   HPV Vaccine (No Doses Required) Completed   Hepatitis C Screening  Completed   HIV Screening  Completed   Hepatitis B Vaccine  Aged Out   Meningitis B Vaccine  Aged Out  *Topic was postponed. The date shown is not the  original due date.       11/08/2024    1:58 PM  Advanced Directives  Does Patient Have a Medical Advance Directive? No  Would patient like information on creating a medical advance directive? Yes (MAU/Ambulatory/Procedural Areas - Information given)  Once completed and notarized, you may return a copy of your Advanced Directive(s) by either of the following: Bring a copy of your health care power of attorney and living will to the office to be added to your chart at your convenience. You can mail a copy to [facility] [address] [place], [place] [address] or email to [email or web address]   Vision: Annual vision screenings are recommended for early detection of glaucoma, cataracts, and diabetic retinopathy. These exams can also reveal signs of chronic conditions such as diabetes and high blood pressure.  Dental: Annual dental screenings help detect early signs of oral cancer, gum disease, and other conditions linked to overall health, including heart disease and diabetes.  Please see the attached documents for additional preventive care recommendations.

## 2024-11-08 NOTE — Telephone Encounter (Signed)
 Pt had AWV today.   She states she stopped metoprolol  last year due to it making her feel jittery and nervous. Can't check home BP readings due to broken machine.  2.   She also states she has not taken her Humalog  Mix in 2 years as her blood sugars at home ran high when she did take it.  Reports blood sugars have been in the 100s without the medication. Not currently taking anything for her blood sugar.  3.   She scored 10 on her 6-cit. Could not remember any of the address she was given.  She has been       scheduled for a follow up with you on 12/05/24.   Please advise if any recommendations before pt's next OV?

## 2024-11-08 NOTE — Progress Notes (Signed)
 "  Chief Complaint  Patient presents with   Medicare Wellness     Subjective:   Heather Walker is a 64 y.o. female who presents for a Medicare Annual Wellness Visit.  Visit info / Clinical Intake: Medicare Wellness Visit Type:: Subsequent Annual Wellness Visit Persons participating in visit and providing information:: patient Medicare Wellness Visit Mode:: Telephone If telephone:: video declined Since this visit was completed virtually, some vitals may be partially provided or unavailable. Missing vitals are due to the limitations of the virtual format.: Unable to obtain vitals - no equipment If Telephone or Video please confirm:: I connected with patient using audio/video enable telemedicine. I verified patient identity with two identifiers, discussed telehealth limitations, and patient agreed to proceed. Patient Location:: home Provider Location:: office Interpreter Needed?: No Pre-visit prep was completed: yes AWV questionnaire completed by patient prior to visit?: no Living arrangements:: lives with spouse/significant other; with family/others (mother) Patient's Overall Health Status Rating: very good Typical amount of pain: (!) a lot (back/knee pains) Does pain affect daily life?: (!) yes (pushes self to do what needs to be done) Are you currently prescribed opioids?: no  Dietary Habits and Nutritional Risks How many meals a day?: (!) 1 (1 meal and 1 snack) Eats fruit and vegetables daily?: yes (may not eat them every day) Most meals are obtained by: preparing own meals In the last 2 weeks, have you had any of the following?: none Diabetic:: (!) yes Any non-healing wounds?: no How often do you check your BS?: 1 Would you like to be referred to a Nutritionist or for Diabetic Management? : no  Functional Status Activities of Daily Living (to include ambulation/medication): Independent Ambulation: Independent Medication Administration: Independent Home Management (perform  basic housework or laundry): Independent Manage your own finances?: yes Primary transportation is: driving Concerns about vision?: no *vision screening is required for [facility]* (Up to date with [facility] on [address], will request record) Concerns about hearing?: no  Fall Screening Falls in the past year?: 0 Number of falls in past year: 0 Was there an injury with Fall?: 0 Fall Risk Category Calculator: 0 Patient Fall Risk Level: Low Fall Risk  Fall Risk Patient at Risk for Falls Due to: Orthopedic patient Fall risk Follow up: Falls evaluation completed  Home and Transportation Safety: All rugs have non-skid backing?: N/A, no rugs All stairs or steps have railings?: N/A, no stairs Grab bars in the bathtub or shower?: (!) no (uses shower chair.) Have non-skid surface in bathtub or shower?: (!) no Good home lighting?: yes Regular seat belt use?: yes Hospital stays in the last year:: no  Cognitive Assessment Difficulty concentrating, remembering, or making decisions? : no Will 6CIT or Mini Cog be Completed: yes What year is it?: 0 points What month is it?: 0 points Give patient an address phrase to remember (5 components): [address] [place]  About what time is it?: 0 points Count backwards from 20 to 1: 0 points Say the months of the year in reverse: 0 points Repeat the address phrase from earlier: 10 points 6 CIT Score: 10 points  Advance Directives (For Healthcare) Does Patient Have a Medical Advance Directive?: No Would patient like information on creating a medical advance directive?: Yes (MAU/Ambulatory/Procedural Areas - Information given)  Reviewed/Updated  Reviewed/Updated: Reviewed All (Medical, Surgical, Family, Medications, Allergies, Care Teams, Patient Goals)    Allergies (verified) Crestor  [rosuvastatin  calcium ], Metformin  and related, Atorvastatin , and Codeine   Current Medications (verified) Outpatient Encounter Medications as of  11/08/2024   Medication Sig   albuterol  (VENTOLIN  HFA) 108 (90 Base) MCG/ACT inhaler Inhale 2 puffs into the lungs every 4 (four) hours as needed for wheezing or shortness of breath (or coughing).   amLODipine  (NORVASC ) 10 MG tablet Take 1 tablet (10 mg total) by mouth daily.   Blood Glucose Monitoring Suppl (ONETOUCH VERIO) w/Device KIT Use to check blood sugar 2 times daily. (Dx: E11.9, Z79.1- type 2 DM with long term insulin  use)   cholecalciferol (VITAMIN D3) 25 MCG (1000 UNIT) tablet Take 1,000 Units by mouth daily.   cloNIDine  (CATAPRES ) 0.1 MG tablet TAKE 1 TABLET(0.1 MG) BY MOUTH TWICE DAILY   Insulin  Pen Needle 32G X 4 MM MISC Inject 1 Device into the skin in the morning and at bedtime. Use as instructed to inject insulin  daily   Lancets (ONETOUCH DELICA PLUS LANCET33G) MISC Use as instructed to check blood sugar 2 times daily   magnesium  30 MG tablet Take 1 tablet (30 mg total) by mouth daily.   Multiple Vitamins-Minerals (MULTI + OMEGA-3 ADULT GUMMIES PO) Take 1 each by mouth daily.   ONETOUCH VERIO test strip Use as instructed to check blood sugar 2 times daily (Dx: E11.9, Z79.1- type 2 DM with long term insulin  use)   potassium chloride  SA (KLOR-CON  M) 20 MEQ tablet Take 2 tablets (40 mEq total) by mouth daily.   pravastatin  (PRAVACHOL ) 40 MG tablet Take 1 tablet (40 mg total) by mouth daily.   triamterene -hydrochlorothiazide  (MAXZIDE -25) 37.5-25 MG tablet Take 1 tablet by mouth daily.   aspirin  EC 81 MG tablet Take 81 mg by mouth daily. Swallow whole. (Patient not taking: Reported on 11/08/2024)   Insulin  Lispro Prot & Lispro (HUMALOG  MIX 75/25 KWIKPEN) (75-25) 100 UNIT/ML Kwikpen Inject 25 Units into the skin 2 (two) times daily. (Patient not taking: Reported on 11/08/2024)   metoprolol  succinate (TOPROL -XL) 50 MG 24 hr tablet Take 1 tablet (50 mg total) by mouth daily. Take with or immediately following a meal. (Patient not taking: Reported on 11/08/2024)   [DISCONTINUED] cyclobenzaprine   (FLEXERIL ) 5 MG tablet Take 1 tablet (5 mg total) by mouth 3 (three) times daily as needed for muscle spasms. (Patient not taking: Reported on 11/08/2024)   [DISCONTINUED] Dulaglutide  (TRULICITY ) 3 MG/0.5ML SOPN Inject 3 mg as directed once a week.   [DISCONTINUED] meloxicam  (MOBIC ) 15 MG tablet Take 1 tablet (15 mg total) by mouth daily.   [DISCONTINUED] triamcinolone  cream (KENALOG ) 0.1 % Apply 1 application topically 2 (two) times daily.   [DISCONTINUED] Vitamin D , Ergocalciferol , (DRISDOL ) 1.25 MG (50000 UNIT) CAPS capsule Take 1 capsule (50,000 Units total) by mouth every 7 (seven) days.   Facility-Administered Encounter Medications as of 11/08/2024  Medication   0.9 %  sodium chloride  infusion    History: Past Medical History:  Diagnosis Date   Allergy    Anemia    Anxiety    Arthritis    Asthma    Diabetes mellitus without complication (HCC)    Heart murmur    Hypertension    Past Surgical History:  Procedure Laterality Date   ABDOMINAL HYSTERECTOMY     APPENDECTOMY     BLADDER SUSPENSION     CESAREAN SECTION     3 previous   TEE WITHOUT CARDIOVERSION N/A 08/24/2017   Procedure: TRANSESOPHAGEAL ECHOCARDIOGRAM (TEE);  Surgeon: Rolan Ezra RAMAN, MD;  Location: [facility];  Service: Cardiovascular;  Laterality: N/A;   TUBAL LIGATION     Family History  Problem Relation Age of Onset  Hypertension Mother    Asthma Mother    Cancer Sister    Hypertension Sister    Asthma Sister    Diabetes Sister    Cancer Sister        ?stomach   Diabetes Sister    Heart defect Sister    Aneurysm Sister    Cerebral aneurysm Sister        behind right eye   Heart Problems Sister        had leaking valve, born with heart on opposite side   Heart Problems Sister    Cancer Brother        cancer--in remission   Colon cancer Brother 60   Cancer Brother        Prostate   Hypertension Maternal Grandmother    Colon polyps Neg Hx    Esophageal cancer Neg Hx    Rectal cancer Neg Hx     Stomach cancer Neg Hx    Social History   Occupational History   Occupation: disabled  Tobacco Use   Smoking status: Never   Smokeless tobacco: Never  Vaping Use   Vaping status: Never Used  Substance and Sexual Activity   Alcohol use: No   Drug use: No   Sexual activity: Yes    Partners: Male    Birth control/protection: Surgical   Tobacco Counseling Counseling given: Not Answered  SDOH Screenings   Food Insecurity: No Food Insecurity (11/08/2024)  Housing: Low Risk (11/08/2024)  Transportation Needs: No Transportation Needs (11/08/2024)  Utilities: Not At Risk (11/08/2024)  Depression (PHQ2-9): Low Risk (11/08/2024)  Financial Resource Strain: Medium Risk (04/18/2022)  Physical Activity: Sufficiently Active (11/08/2024)  Social Connections: Socially Integrated (11/08/2024)  Stress: No Stress Concern Present (11/08/2024)  Tobacco Use: Low Risk (11/08/2024)   See flowsheets for full screening details  Depression Screen PHQ 2 & 9 Depression Scale- Over the past 2 weeks, how often have you been bothered by any of the following problems? Little interest or pleasure in doing things: 0 Feeling down, depressed, or hopeless (PHQ Adolescent also includes...irritable): 0 PHQ-2 Total Score: 0 Trouble falling or staying asleep, or sleeping too much: 3 (has always had trouble staying asleep, takes melatonin) Feeling tired or having little energy: 0 Poor appetite or overeating (PHQ Adolescent also includes...weight loss): 0 Feeling bad about yourself - or that you are a failure or have let yourself or your family down: 0 Trouble concentrating on things, such as reading the newspaper or watching television (PHQ Adolescent also includes...like school work): 0 Moving or speaking so slowly that other people could have noticed. Or the opposite - being so fidgety or restless that you have been moving around a lot more than usual: 0 Thoughts that you would be better off dead, or of hurting  yourself in some way: 0 PHQ-9 Total Score: 3 If you checked off any problems, how difficult have these problems made it for you to do your work, take care of things at home, or get along with other people?: Not difficult at all  Depression Treatment Depression Interventions/Treatment : EYV7-0 Score <4 Follow-up Not Indicated     Goals Addressed             This Visit's Progress    Patient Stated   On track    Continue walking & drinking plenty of water & eat healthier             Objective:    Today's Vitals   11/08/24 1340  Weight: 193 lb (87.5 kg)  Height: 5' 3 (1.6 m)   Body mass index is 34.19 kg/m.  Hearing/Vision screen No results found. Immunizations and Health Maintenance Health Maintenance  Topic Date Due   OPHTHALMOLOGY EXAM  04/09/2024   Influenza Vaccine  01/14/2025 (Originally 05/17/2024)   DTaP/Tdap/Td (3 - Td or Tdap) 05/27/2025 (Originally 02/23/2021)   Pneumococcal Vaccine: 50+ Years (2 of 2 - PCV) 05/27/2025 (Originally 07/16/2015)   COVID-19 Vaccine (3 - 2025-26 season) 11/08/2025 (Originally 06/17/2024)   Zoster Vaccines- Shingrix (1 of 2) 11/08/2025 (Originally 10/26/2012)   Diabetic kidney evaluation - Urine ACR  11/27/2024   HEMOGLOBIN A1C  11/27/2024   Mammogram  01/22/2025   FOOT EXAM  05/27/2025   Diabetic kidney evaluation - eGFR measurement  07/02/2025   Medicare Annual Wellness (AWV)  11/08/2025   Colonoscopy  05/08/2026   HPV VACCINES (No Doses Required) Completed   Hepatitis C Screening  Completed   HIV Screening  Completed   Hepatitis B Vaccines 19-59 Average Risk  Aged Out   Meningococcal B Vaccine  Aged Out        Assessment/Plan:  This is a routine wellness examination for [place].  Patient Care Team: Antonio Meth, Jamee SAUNDERS, DO as PCP - General (Family Medicine) Carla Milling, RPH-CPP (Pharmacist) Shamleffer, Donell Cardinal, MD as Consulting Physician (Endocrinology) [facility]  (Optometry)  I have personally reviewed and noted the following in the patients chart:   Medical and social history Use of alcohol, tobacco or illicit drugs  Current medications and supplements including opioid prescriptions. Functional ability and status Nutritional status Physical activity Advanced directives List of other physicians Hospitalizations, surgeries, and ER visits in previous 12 months Vitals Screenings to include cognitive, depression, and falls Referrals and appointments  No orders of the defined types were placed in this encounter.  In addition, I have reviewed and discussed with patient certain preventive protocols, quality metrics, and best practice recommendations. A written personalized care plan for preventive services as well as general preventive health recommendations were provided to patient.   Lolita Libra, CMA   11/08/2024   Return in 1 year (on 11/08/2025).  After Visit Summary: (Mail) Due to this being a telephonic visit, the after visit summary with patients personalized plan was offered to patient via mail   Nurse Notes: HM Addressed: Vaccines Due: Shingles, Pneumonia, Covid, Flu--pt declines Mammogram ordered DM eye exam report requested See phone note  "

## 2024-11-13 NOTE — Telephone Encounter (Signed)
"  Pt is scheduled for ov  "

## 2024-12-05 ENCOUNTER — Ambulatory Visit: Admitting: Family Medicine

## 2025-01-23 ENCOUNTER — Ambulatory Visit

## 2025-11-11 ENCOUNTER — Ambulatory Visit
